# Patient Record
Sex: Female | Born: 1940
Health system: Southern US, Community
[De-identification: ages and names within clinical notes are randomized; demographics above are authoritative.]

## PROBLEM LIST (undated history)

## (undated) DIAGNOSIS — I480 Paroxysmal atrial fibrillation: Secondary | ICD-10-CM

## (undated) DIAGNOSIS — N183 Chronic kidney disease, stage 3 unspecified: Secondary | ICD-10-CM

## (undated) DIAGNOSIS — G4733 Obstructive sleep apnea (adult) (pediatric): Secondary | ICD-10-CM

## (undated) DIAGNOSIS — R52 Pain, unspecified: Secondary | ICD-10-CM

## (undated) DIAGNOSIS — F419 Anxiety disorder, unspecified: Secondary | ICD-10-CM

## (undated) DIAGNOSIS — I10 Essential (primary) hypertension: Secondary | ICD-10-CM

## (undated) DIAGNOSIS — I272 Pulmonary hypertension, unspecified: Secondary | ICD-10-CM

## (undated) DIAGNOSIS — Z9989 Dependence on other enabling machines and devices: Secondary | ICD-10-CM

## (undated) DIAGNOSIS — E875 Hyperkalemia: Secondary | ICD-10-CM

## (undated) DIAGNOSIS — D649 Anemia, unspecified: Secondary | ICD-10-CM

## (undated) DIAGNOSIS — I350 Nonrheumatic aortic (valve) stenosis: Secondary | ICD-10-CM

## (undated) DIAGNOSIS — K219 Gastro-esophageal reflux disease without esophagitis: Secondary | ICD-10-CM

## (undated) DIAGNOSIS — M8580 Other specified disorders of bone density and structure, unspecified site: Secondary | ICD-10-CM

## (undated) DIAGNOSIS — B009 Herpesviral infection, unspecified: Secondary | ICD-10-CM

## (undated) DIAGNOSIS — M109 Gout, unspecified: Secondary | ICD-10-CM

## (undated) DIAGNOSIS — M2550 Pain in unspecified joint: Secondary | ICD-10-CM

## (undated) DIAGNOSIS — G3184 Mild cognitive impairment, so stated: Secondary | ICD-10-CM

## (undated) DIAGNOSIS — E119 Type 2 diabetes mellitus without complications: Secondary | ICD-10-CM

## (undated) DIAGNOSIS — E785 Hyperlipidemia, unspecified: Secondary | ICD-10-CM

## (undated) DIAGNOSIS — R001 Bradycardia, unspecified: Secondary | ICD-10-CM

## (undated) DIAGNOSIS — I6523 Occlusion and stenosis of bilateral carotid arteries: Secondary | ICD-10-CM

## (undated) DIAGNOSIS — G459 Transient cerebral ischemic attack, unspecified: Secondary | ICD-10-CM

## (undated) HISTORY — DX: Other specified disorders of bone density and structure, unspecified site: M85.80

## (undated) HISTORY — PX: ABDOMINAL HYSTERECTOMY: SHX81

## (undated) HISTORY — PX: BLADDER SURGERY: SHX569

## (undated) HISTORY — DX: Anxiety disorder, unspecified: F41.9

## (undated) HISTORY — DX: Herpesviral infection, unspecified: B00.9

## (undated) HISTORY — DX: Nonrheumatic aortic (valve) stenosis: I35.0

## (undated) HISTORY — DX: Pain in unspecified joint: M25.50

## (undated) HISTORY — DX: Anemia, unspecified: D64.9

## (undated) HISTORY — DX: Pain, unspecified: R52

## (undated) HISTORY — DX: Dependence on other enabling machines and devices: Z99.89

## (undated) HISTORY — DX: Hyperkalemia: E87.5

## (undated) HISTORY — DX: Hyperlipidemia, unspecified: E78.5

## (undated) HISTORY — DX: Obstructive sleep apnea (adult) (pediatric): G47.33

## (undated) HISTORY — DX: Occlusion and stenosis of bilateral carotid arteries: I65.23

## (undated) HISTORY — DX: Transient cerebral ischemic attack, unspecified: G45.9

## (undated) HISTORY — DX: Gout, unspecified: M10.9

## (undated) HISTORY — PX: CHOLECYSTECTOMY: SHX55

## (undated) HISTORY — DX: Type 2 diabetes mellitus without complications: E11.9

## (undated) HISTORY — DX: Pulmonary hypertension, unspecified: I27.20

## (undated) HISTORY — DX: Morbid (severe) obesity due to excess calories: E66.01

## (undated) HISTORY — PX: BACK SURGERY: SHX140

## (undated) HISTORY — DX: Gastro-esophageal reflux disease without esophagitis: K21.9

## (undated) HISTORY — DX: Essential (primary) hypertension: I10

## (undated) HISTORY — DX: Mild cognitive impairment of uncertain or unknown etiology: G31.84

## (undated) HISTORY — PX: APPENDECTOMY: SHX54

## (undated) HISTORY — DX: Bradycardia, unspecified: R00.1

## (undated) HISTORY — DX: Chronic kidney disease, stage 3 (moderate): N18.3

## (undated) HISTORY — DX: Paroxysmal atrial fibrillation: I48.0

## (undated) HISTORY — DX: Chronic kidney disease, stage 3 unspecified: N18.30

---

## 2012-01-26 DIAGNOSIS — I2510 Atherosclerotic heart disease of native coronary artery without angina pectoris: Secondary | ICD-10-CM | POA: Insufficient documentation

## 2012-01-31 DIAGNOSIS — E039 Hypothyroidism, unspecified: Secondary | ICD-10-CM | POA: Insufficient documentation

## 2012-01-31 DIAGNOSIS — Z9189 Other specified personal risk factors, not elsewhere classified: Secondary | ICD-10-CM | POA: Insufficient documentation

## 2012-01-31 DIAGNOSIS — G459 Transient cerebral ischemic attack, unspecified: Secondary | ICD-10-CM | POA: Insufficient documentation

## 2012-01-31 DIAGNOSIS — N1832 Chronic kidney disease, stage 3b: Secondary | ICD-10-CM | POA: Insufficient documentation

## 2012-01-31 DIAGNOSIS — F419 Anxiety disorder, unspecified: Secondary | ICD-10-CM | POA: Insufficient documentation

## 2012-01-31 DIAGNOSIS — E118 Type 2 diabetes mellitus with unspecified complications: Secondary | ICD-10-CM | POA: Insufficient documentation

## 2012-01-31 DIAGNOSIS — M329 Systemic lupus erythematosus, unspecified: Secondary | ICD-10-CM | POA: Insufficient documentation

## 2013-04-02 ENCOUNTER — Other Ambulatory Visit: Payer: Self-pay | Admitting: *Deleted

## 2013-04-02 MED ORDER — "NEEDLE (DISP) 30G X 1/2"" MISC": 1.0000 | Freq: Two times a day (BID) | Status: DC

## 2013-04-02 MED ORDER — PIOGLITAZONE HCL 15 MG PO TABS: 15.0000 mg | ORAL_TABLET | Freq: Every day | ORAL | Status: DC

## 2013-05-26 ENCOUNTER — Other Ambulatory Visit: Payer: Self-pay | Admitting: Cardiology

## 2013-05-26 DIAGNOSIS — Z79899 Other long term (current) drug therapy: Secondary | ICD-10-CM

## 2013-05-26 DIAGNOSIS — I4891 Unspecified atrial fibrillation: Secondary | ICD-10-CM

## 2013-05-29 ENCOUNTER — Other Ambulatory Visit: Payer: Self-pay | Admitting: *Deleted

## 2013-05-29 ENCOUNTER — Other Ambulatory Visit: Payer: Self-pay | Admitting: Endocrinology

## 2013-05-29 DIAGNOSIS — E119 Type 2 diabetes mellitus without complications: Secondary | ICD-10-CM | POA: Insufficient documentation

## 2013-05-29 DIAGNOSIS — E785 Hyperlipidemia, unspecified: Secondary | ICD-10-CM | POA: Insufficient documentation

## 2013-05-29 DIAGNOSIS — E782 Mixed hyperlipidemia: Secondary | ICD-10-CM | POA: Insufficient documentation

## 2013-05-31 ENCOUNTER — Other Ambulatory Visit (INDEPENDENT_AMBULATORY_CARE_PROVIDER_SITE_OTHER): Payer: Medicare Other

## 2013-05-31 DIAGNOSIS — E119 Type 2 diabetes mellitus without complications: Secondary | ICD-10-CM

## 2013-05-31 DIAGNOSIS — E785 Hyperlipidemia, unspecified: Secondary | ICD-10-CM

## 2013-05-31 LAB — URINALYSIS, ROUTINE W REFLEX MICROSCOPIC
Bilirubin Urine: NEGATIVE
Hgb urine dipstick: NEGATIVE
Ketones, ur: NEGATIVE
Nitrite: POSITIVE
Specific Gravity, Urine: 1.01 (ref 1.000–1.030)
Total Protein, Urine: NEGATIVE
Urine Glucose: NEGATIVE
Urobilinogen, UA: 0.2 (ref 0.0–1.0)

## 2013-05-31 LAB — COMPREHENSIVE METABOLIC PANEL
ALT: 26 U/L (ref 0–35)
AST: 23 U/L (ref 0–37)
CO2: 25 mEq/L (ref 19–32)
Calcium: 9 mg/dL (ref 8.4–10.5)
Chloride: 105 mEq/L (ref 96–112)
Creatinine, Ser: 1.3 mg/dL — ABNORMAL HIGH (ref 0.4–1.2)
GFR: 41.28 mL/min — ABNORMAL LOW (ref >60.00)
Potassium: 4.3 mEq/L (ref 3.5–5.1)
Sodium: 137 mEq/L (ref 135–145)
Total Protein: 7.1 g/dL (ref 6.0–8.3)

## 2013-05-31 LAB — LIPID PANEL
HDL: 30.6 mg/dL — ABNORMAL LOW (ref >39.00)
Triglycerides: 242 mg/dL — ABNORMAL HIGH (ref 0.0–149.0)
VLDL: 48.4 mg/dL — ABNORMAL HIGH (ref 0.0–40.0)

## 2013-05-31 LAB — LDL CHOLESTEROL, DIRECT: Direct LDL: 97.2 mg/dL

## 2013-05-31 LAB — HEMOGLOBIN A1C: Hgb A1c MFr Bld: 6.8 % — ABNORMAL HIGH (ref 4.6–6.5)

## 2013-06-04 ENCOUNTER — Ambulatory Visit (INDEPENDENT_AMBULATORY_CARE_PROVIDER_SITE_OTHER): Payer: Medicare Other | Admitting: Endocrinology

## 2013-06-04 ENCOUNTER — Other Ambulatory Visit: Payer: Self-pay | Admitting: *Deleted

## 2013-06-04 ENCOUNTER — Encounter: Payer: Self-pay | Admitting: Endocrinology

## 2013-06-04 VITALS — BP 128/78 | HR 71 | Temp 98.5°F | Resp 12 | Ht 61.0 in | Wt 254.1 lb

## 2013-06-04 DIAGNOSIS — E039 Hypothyroidism, unspecified: Secondary | ICD-10-CM

## 2013-06-04 DIAGNOSIS — IMO0001 Reserved for inherently not codable concepts without codable children: Secondary | ICD-10-CM

## 2013-06-04 MED ORDER — METFORMIN HCL 500 MG PO TABS: 500.0000 mg | ORAL_TABLET | Freq: Every day | ORAL | Status: DC

## 2013-06-04 MED ORDER — GLUCOSE BLOOD VI STRP: ORAL_STRIP | Status: DC

## 2013-06-04 NOTE — Progress Notes (Signed)
Patient ID: Tina Hester, female   DOB: 1941/08/24, 72 y.o.   MRN: [id number]  Tina Hester is an 72 y.o. female.   Reason for Appointment: Diabetes follow-up   History of Present Illness   Diagnosis: Type 2 DIABETES MELITUS, long-standing    Previous history: Her old records are not available and details not known. However she has been on insulin for a few years. Because of cost she has been switched from Lantus to NPH insulin twice a day She had previously been taking metformin but this was stopped when she had renal insufficiency Overall her blood sugars have been difficult to control and she has previously had relatively poor control. However with adding Actos several months ago her blood sugars had started improving significantly  Recent history: Her blood sugars appear to be doing reasonably well recently with A1c below 7% now She is still taking large doses of mealtime coverage but does not have any significant hypoglycemia except occasionally has readings in the 60s She is concerned when her blood sugars near 100 in the morning and will not take her full dose of NovoLog Also is checking her blood sugar somewhat sporadically now. May have occasional high readings after meals with dietary and consistency     Oral hypoglycemic drugs: Actos        Side effects from medications: None, no edema with Actos Insulin regimen: Humulin N   20 am, 45 hs Humalog before meals 30-40 breakfast; 40 lunch -45  supper      Proper timing of medications in relation to meals: Yes.          Monitors blood glucose: Once a day.    Glucometer:  FreeStyle       Blood Glucose readings from meter download: readings before breakfast:  105-142, 1-2 PM: 63, 185, 101. Late evening 69-225 with only one reading below 70 and once over 180. Overall average 131  Hypoglycemia frequency:  rare, mild hypoglycemia after lunch recently     Meals: 2-3 meals per day. Diet variable, has a protein at breakfast  consistently with a lean meat or egg;          Physical activity: exercise: minimal, has musculoskeletal limitations. Also does not want to join the [facility] because of cost            Wt Readings from Last 3 Encounters:  06/04/13 254 lb 1.6 oz (115.259 kg)    LABS:  Appointment on 05/31/2013  Component Date Value Range Status  . Hemoglobin A1C 05/31/2013 6.8* 4.6 - 6.5 % Final   Glycemic Control Guidelines for People with Diabetes:Non Diabetic:  <6%Goal of Therapy: <7%Additional Action Suggested:  >8%   . Sodium 05/31/2013 137  135 - 145 mEq/L Final  . Potassium 05/31/2013 4.3  3.5 - 5.1 mEq/L Final  . Chloride 05/31/2013 105  96 - 112 mEq/L Final  . CO2 05/31/2013 25  19 - 32 mEq/L Final  . Glucose, Bld 05/31/2013 109* 70 - 99 mg/dL Final  . BUN 40/98/1191 29* 6 - 23 mg/dL Final  . Creatinine, Ser 05/31/2013 1.3* 0.4 - 1.2 mg/dL Final  . Total Bilirubin 05/31/2013 0.6  0.3 - 1.2 mg/dL Final  . Alkaline Phosphatase 05/31/2013 33* 39 - 117 U/L Final  . AST 05/31/2013 23  0 - 37 U/L Final  . ALT 05/31/2013 26  0 - 35 U/L Final  . Total Protein 05/31/2013 7.1  6.0 - 8.3 g/dL Final  . Albumin 47/82/9562 3.7  3.5 -  5.2 g/dL Final  . Calcium 16/05/9603 9.0  8.4 - 10.5 mg/dL Final  . GFR 54/04/8118 41.28* >60.00 mL/min Final  . Microalb, Ur 05/31/2013 5.3* 0.0 - 1.9 mg/dL Final  . Creatinine,U 14/78/2956 56.8   Final  . Microalb Creat Ratio 05/31/2013 9.3  0.0 - 30.0 mg/g Final  . Cholesterol 05/31/2013 166  0 - 200 mg/dL Final   ATP III Classification       Desirable:  < 200 mg/dL               Borderline High:  200 - 239 mg/dL          High:  > = 213 mg/dL  . Triglycerides 05/31/2013 242.0* 0.0 - 149.0 mg/dL Final   Normal:  <086 mg/dLBorderline High:  150 - 199 mg/dL  . HDL 05/31/2013 30.60* >39.00 mg/dL Final  . VLDL 57/84/6962 48.4* 0.0 - 40.0 mg/dL Final  . Total CHOL/HDL Ratio 05/31/2013 5   Final                  Men          Women1/2 Average Risk     3.4          3.3Average Risk           5.0          4.42X Average Risk          9.6          7.13X Average Risk          15.0          11.0                      . Color, Urine 05/31/2013 LT. YELLOW  Yellow;Lt. Yellow Final  . APPearance 05/31/2013 CLEAR  Clear Final  . Specific Gravity, Urine 05/31/2013 1.010  1.000-1.030 Final  . pH 05/31/2013 6.0  5.0 - 8.0 Final  . Total Protein, Urine 05/31/2013 NEGATIVE  Negative Final  . Urine Glucose 05/31/2013 NEGATIVE  Negative Final  . Ketones, ur 05/31/2013 NEGATIVE  Negative Final  . Bilirubin Urine 05/31/2013 NEGATIVE  Negative Final  . Hgb urine dipstick 05/31/2013 NEGATIVE  Negative Final  . Urobilinogen, UA 05/31/2013 0.2  0.0 - 1.0 Final  . Leukocytes, UA 05/31/2013 TRACE  Negative Final  . Nitrite 05/31/2013 POSITIVE  Negative Final  . WBC, UA 05/31/2013 3-6/hpf  0-2/hpf Final  . RBC / HPF 05/31/2013 0-2/hpf  0-2/hpf Final  . Squamous Epithelial / LPF 05/31/2013 Rare(0-4/hpf)  Rare(0-4/hpf) Final  . Bacteria, UA 05/31/2013 Many(>50/hpf)  None Final  . Direct LDL 05/31/2013 97.2   Final   Optimal:  <100 mg/dLNear or Above Optimal:  100-129 mg/dLBorderline High:  130-159 mg/dLHigh:  160-189 mg/dLVery High:  >190 mg/dL      Medication List       This list is accurate as of: 06/04/13 10:28 AM.  Always use your most recent med list.               acyclovir 400 MG tablet  Commonly known as:  ZOVIRAX     amLODipine 5 MG tablet  Commonly known as:  NORVASC     atorvastatin 40 MG tablet  Commonly known as:  LIPITOR  40 mg.     COLCRYS 0.6 MG tablet  Generic drug:  colchicine     fenofibrate 145 MG tablet  Commonly known as:  TRICOR     HUMALOG  100 UNIT/ML injection  Generic drug:  insulin lispro  Takes 40-40-45, depending on sugars     HUMULIN N 100 UNIT/ML injection  Generic drug:  insulin NPH  Takes 20 units in am and 45 units at night     INSULIN SYRINGE .5CC/30GX5/16" 30G X 5/16" 0.5 ML Misc     levothyroxine 137 MCG tablet  Commonly known as:   SYNTHROID, LEVOTHROID     lisinopril-hydrochlorothiazide 20-25 MG per tablet  Commonly known as:  PRINZIDE,ZESTORETIC     metoprolol succinate 50 MG 24 hr tablet  Commonly known as:  TOPROL-XL     NEEDLE (DISP) 30 G 30G X 1/2" Misc  Commonly known as:  BD DISP NEEDLES  1 each by Does not apply route 2 (two) times daily before a meal.     pioglitazone 15 MG tablet  Commonly known as:  ACTOS  Take 1 tablet (15 mg total) by mouth daily.     prednisoLONE acetate 1 % ophthalmic suspension  Commonly known as:  PRED FORTE     ULORIC 40 MG tablet  Generic drug:  febuxostat  40 mg.        Allergies:  Allergies  Allergen Reactions  . Pneumovax 23 [Pneumococcal Vac Polyvalent] Anaphylaxis    weakness  . Sulfa Antibiotics Hives    No past medical history on file.  No past surgical history on file.  No family history on file.  Social History:  reports that she has never smoked. She has never used smokeless tobacco. Her alcohol and drug histories are not on file.  Review of Systems:  Hypertension:  blood pressure is excellent, currently taking Zestoretic, metoprolol and amlodipine   Lipids: Triglycerides have been high, was started on fenofibrate on her last visit, now 242, LDL is now below 100    Gout: Has had recurrent problems with this, has questions about starting Uloric given by her rheumatologist, not clear if she has tried allopurinol before  Still having problems with sciatica on the right side and foot pain  No recent swelling on her feet, does have joint swelling of the right foot periodically   Examination:   BP 128/78  Pulse 71  Temp(Src) 98.5 F (36.9 C)  Resp 12  Ht 5\' 1"  (1.549 m)  Wt 254 lb 1.6 oz (115.259 kg)  BMI 48.04 kg/m2  SpO2 97%  Body mass index is 48.04 kg/(m^2).    ASSESSMENT/ PLAN::   Diabetes type 2   Blood glucose control is good with her A1c below 7%. Overall doing quite well with NPH and Humalog along with low dose Actos She  has gained weight and has limited ability to exercise. She does not think she had large portions Because of her difficulty with weight loss will try low dose metformin again in the evening. Reassured her that she should do well with 500 mg daily, recent creatinine only 1.3 She initially check blood sugars more consistently especially after meals. Meanwhile can reduce her morning NovoLog to 35 and adjust her suppertime dose as discussed based on meal size and carbohydrate intake  CKD: Creatinine 1.3, he has been on lisinopril HCT  HYPERLIPIDEMIA: Although her triglycerides are still over 200 her LDL is below 100. HDL 31. Currently being treated with fenofibrate and also low dose Actos. Hopefully will lose weight and improve lipids further  Discussed management of gout, weight loss and glucose monitoring as well as sugar targets, insulin adjustment  Counseling time over 50% of today's 25 minute  visit   Prabhav Faulkenberry 06/04/2013, 10:28 AM

## 2013-06-04 NOTE — Patient Instructions (Addendum)
35 Humalog in ams; 40-45 at supper based on meal size  Please check blood sugars at least half the time about 2 hours after any meal and as directed on waking up. Please bring blood sugar monitor to each visit  Water aerobics  Metformin 500 mg at supper

## 2013-06-06 ENCOUNTER — Telehealth: Payer: Self-pay | Admitting: Cardiology

## 2013-06-06 NOTE — Telephone Encounter (Signed)
New Problem  Pt in "[place]" Blood thinner samples.. Eliquis

## 2013-06-07 MED ORDER — APIXABAN 5 MG PO TABS: 5.0000 mg | ORAL_TABLET | Freq: Two times a day (BID) | ORAL | Status: DC

## 2013-06-07 NOTE — Telephone Encounter (Signed)
Spoke with pt and made aware. She asked that I call in a rx for her.

## 2013-06-15 ENCOUNTER — Other Ambulatory Visit: Payer: Self-pay

## 2013-06-19 ENCOUNTER — Other Ambulatory Visit (INDEPENDENT_AMBULATORY_CARE_PROVIDER_SITE_OTHER): Payer: Medicare Other

## 2013-06-19 DIAGNOSIS — Z79899 Other long term (current) drug therapy: Secondary | ICD-10-CM

## 2013-06-19 DIAGNOSIS — I4891 Unspecified atrial fibrillation: Secondary | ICD-10-CM

## 2013-06-19 DIAGNOSIS — E039 Hypothyroidism, unspecified: Secondary | ICD-10-CM

## 2013-06-19 LAB — TSH: TSH: 1.28 u[IU]/mL (ref 0.35–5.50)

## 2013-06-19 LAB — CBC
HCT: 32.1 % — ABNORMAL LOW (ref 36.0–46.0)
MCHC: 34.2 g/dL (ref 30.0–36.0)
MCV: 92.3 fl (ref 78.0–100.0)
RBC: 3.48 Mil/uL — ABNORMAL LOW (ref 3.87–5.11)
RDW: 14.5 % (ref 11.5–14.6)

## 2013-06-19 LAB — T4, FREE: Free T4: 1.21 ng/dL (ref 0.60–1.60)

## 2013-06-21 ENCOUNTER — Other Ambulatory Visit: Payer: Self-pay | Admitting: *Deleted

## 2013-06-21 MED ORDER — FENOFIBRATE 145 MG PO TABS: 145.0000 mg | ORAL_TABLET | Freq: Every day | ORAL | Status: DC

## 2013-07-31 ENCOUNTER — Telehealth: Payer: Self-pay | Admitting: *Deleted

## 2013-07-31 NOTE — Telephone Encounter (Signed)
Patient called to change her pharmacy to mail order with [facility]. She needs refills on her eliquis, lisinopril, metoprolol, amlodipine, and lipitor. She also requests samples of eliquis, we are expecting some samples to come in today or tomorrow as we are currently out. She stated that her pcp recommends that she get her cardiologist to manage and refill all of these, but in the past her pcp has refilled them for her. Thanks, MI

## 2013-08-01 MED ORDER — APIXABAN 5 MG PO TABS: 5.0000 mg | ORAL_TABLET | Freq: Two times a day (BID) | ORAL | Status: DC

## 2013-08-01 MED ORDER — METOPROLOL SUCCINATE ER 50 MG PO TB24: 50.0000 mg | ORAL_TABLET | Freq: Two times a day (BID) | ORAL | Status: DC

## 2013-08-01 MED ORDER — LISINOPRIL-HYDROCHLOROTHIAZIDE 20-25 MG PO TABS: 1.0000 | ORAL_TABLET | Freq: Every day | ORAL | Status: DC

## 2013-08-01 MED ORDER — AMLODIPINE BESYLATE 5 MG PO TABS: 2.5000 mg | ORAL_TABLET | Freq: Every day | ORAL | Status: DC

## 2013-08-01 MED ORDER — ATORVASTATIN CALCIUM 40 MG PO TABS: 40.0000 mg | ORAL_TABLET | Freq: Every day | ORAL | Status: DC

## 2013-08-01 NOTE — Telephone Encounter (Signed)
Rx sent in for pt. Going to check on samples for pt.

## 2013-08-02 NOTE — Telephone Encounter (Signed)
Pt is aware samples are up front for her to pick up.

## 2013-08-15 ENCOUNTER — Other Ambulatory Visit: Payer: Self-pay | Admitting: *Deleted

## 2013-08-15 MED ORDER — "INSULIN SYRINGE 30G X 5/16"" 0.5 ML MISC": Status: DC

## 2013-08-15 MED ORDER — INSULIN LISPRO 100 UNIT/ML (KWIKPEN): PEN_INJECTOR | SUBCUTANEOUS | Status: DC

## 2013-08-31 ENCOUNTER — Other Ambulatory Visit: Payer: Medicare Other

## 2013-09-04 ENCOUNTER — Ambulatory Visit (INDEPENDENT_AMBULATORY_CARE_PROVIDER_SITE_OTHER): Payer: Medicare Other | Admitting: Endocrinology

## 2013-09-04 ENCOUNTER — Other Ambulatory Visit: Payer: Medicare Other

## 2013-09-04 ENCOUNTER — Encounter: Payer: Self-pay | Admitting: Endocrinology

## 2013-09-04 VITALS — BP 126/50 | HR 59 | Temp 98.2°F | Resp 14 | Ht 61.0 in | Wt 249.6 lb

## 2013-09-04 DIAGNOSIS — E1165 Type 2 diabetes mellitus with hyperglycemia: Principal | ICD-10-CM

## 2013-09-04 DIAGNOSIS — E669 Obesity, unspecified: Secondary | ICD-10-CM

## 2013-09-04 DIAGNOSIS — E785 Hyperlipidemia, unspecified: Secondary | ICD-10-CM

## 2013-09-04 DIAGNOSIS — IMO0001 Reserved for inherently not codable concepts without codable children: Secondary | ICD-10-CM

## 2013-09-04 DIAGNOSIS — M109 Gout, unspecified: Secondary | ICD-10-CM

## 2013-09-04 LAB — COMPREHENSIVE METABOLIC PANEL
ALT: 40 U/L — AB (ref 0–35)
AST: 32 U/L (ref 0–37)
Albumin: 3.8 g/dL (ref 3.5–5.2)
Alkaline Phosphatase: 48 U/L (ref 39–117)
BUN: 34 mg/dL — AB (ref 6–23)
CHLORIDE: 105 meq/L (ref 96–112)
CO2: 26 mEq/L (ref 19–32)
CREATININE: 1.4 mg/dL — AB (ref 0.4–1.2)
Calcium: 9.2 mg/dL (ref 8.4–10.5)
GFR: 38.27 mL/min — AB (ref >60.00)
GLUCOSE: 145 mg/dL — AB (ref 70–99)
Potassium: 4.7 mEq/L (ref 3.5–5.1)
Sodium: 138 mEq/L (ref 135–145)
Total Bilirubin: 0.5 mg/dL (ref 0.3–1.2)
Total Protein: 7 g/dL (ref 6.0–8.3)

## 2013-09-04 LAB — HEMOGLOBIN A1C: Hgb A1c MFr Bld: 6.7 % — ABNORMAL HIGH (ref 4.6–6.5)

## 2013-09-04 NOTE — Patient Instructions (Signed)
Take Lipitor daily  Take insulin just before eating  Do not take over 20 units Humalog at bedtime

## 2013-09-04 NOTE — Progress Notes (Signed)
Patient ID: Tina Hester, female   DOB: [birth year], 73 y.o.   MRN: [id number]   Reason for Appointment: Diabetes follow-up   History of Present Illness   Diagnosis: Type 2 DIABETES MELITUS, long-standing    Previous history: Her old records are not available and details not known. However she has been on insulin for a few years. Because of cost she has been switched from Lantus to NPH insulin twice a day She had previously been taking metformin but this was stopped when she had renal insufficiency Overall her blood sugars have been difficult to control and she has previously had relatively poor control. However with adding Actos several months ago her blood sugars had started improving significantly  Recent history: Her blood sugars appear to be doing overall well recently However A1c is pending as she was told by her insurance not to get labs ahead of time Problems identified:  She is not checking her blood sugars often enough  She is periodically forgetting to take her insulin at mealtimes and she thinks she has trouble remembering. Also has a busy lifestyle and may not take the insulin when she is eating. However her compliance is somewhat better with using the insulin pen  Tendency to hypoglycemia occasionally with taking her Humalog at bedtime when she forgets to suppertime dose  Tendency to hypoglycemia if she is doing much more activity like walking at the mall  Periodic high readings after supper are mostly related to her getting the Humalog before eating  Starting to lose a little weight and is starting to do some weight training; still needs significant weight loss  Limitation of her metformin dose because of renal dysfunction. However fasting readings may be better with starting 500 mg in the evening on the last visit   She is still taking large doses of mealtime coverage although not exceeding 40 units recently      Oral hypoglycemic drugs: Actos, Metformin        Side  effects from medications: None, no edema with Actos Insulin regimen: Humulin N 20 am, 45 hs Humalog before meals 30-40 based on Carbs    Proper timing of medications in relation to meals: Yes.           Monitors blood glucose: Once a day.    Glucometer:  FreeStyle       Blood Glucose readings from meter download:   PREMEAL Breakfast Lunch Dinner Bedtime Overall  Glucose range:  99-220   143, 148   58, 139   85-282    Mean/median:  139     205  137   POST-MEAL PC Breakfast PC Lunch PC Dinner  Glucose range: ?   80-122  ?   Mean/median:     OVERNIGHT: 44-230  Hypoglycemia: Has 3 episodes, twice overnight and once at 7:40 PM    Meals: 2-3 meals per day. Diet variable, has a protein at breakfast consistently with a lean meat or egg          Physical activity: exercise: starting weights recently            Wt Readings from Last 3 Encounters:  09/04/13 249 lb 9.6 oz (113.218 kg)  06/04/13 254 lb 1.6 oz (115.259 kg)   LABS:  Lab Results  Component Value Date   HGBA1C 6.8* 05/31/2013   Lab Results  Component Value Date   MICROALBUR 5.3* 05/31/2013   CREATININE 1.4* 06/19/2013       Medication List  This list is accurate as of: 09/04/13 10:37 AM.  Always use your most recent med list.               acyclovir 400 MG tablet  Commonly known as:  ZOVIRAX     amLODipine 5 MG tablet  Commonly known as:  NORVASC  Take 0.5 tablets (2.5 mg total) by mouth daily.     apixaban 5 MG Tabs tablet  Commonly known as:  ELIQUIS  Take 1 tablet (5 mg total) by mouth 2 (two) times daily.     atorvastatin 40 MG tablet  Commonly known as:  LIPITOR  Take 1 tablet (40 mg total) by mouth daily at 6 PM.     COLCRYS 0.6 MG tablet  Generic drug:  colchicine     fenofibrate 145 MG tablet  Commonly known as:  TRICOR  Take 1 tablet (145 mg total) by mouth daily.     glucose blood test strip  Commonly known as:  FREESTYLE LITE  Use as instructed     HUMULIN N 100 UNIT/ML injection   Generic drug:  insulin NPH Human  Takes 20 units in am and 45 units at night     insulin lispro 100 UNIT/ML KiwkPen  Commonly known as:  HUMALOG  Inject 40 units 3 times a day with meals     INSULIN SYRINGE .5CC/30GX5/16" 30G X 5/16" 0.5 ML Misc  Use 2 needles per day to inject insulin     levothyroxine 137 MCG tablet  Commonly known as:  SYNTHROID, LEVOTHROID     lisinopril-hydrochlorothiazide 20-25 MG per tablet  Commonly known as:  PRINZIDE,ZESTORETIC  Take 1 tablet by mouth daily.     metFORMIN 500 MG tablet  Commonly known as:  GLUCOPHAGE  Take 1 tablet (500 mg total) by mouth daily after supper.     metoprolol succinate 50 MG 24 hr tablet  Commonly known as:  TOPROL-XL  Take 1 tablet (50 mg total) by mouth 2 (two) times daily.     NEEDLE (DISP) 30 G 30G X 1/2" Misc  Commonly known as:  BD DISP NEEDLES  1 each by Does not apply route 2 (two) times daily before a meal.     pioglitazone 15 MG tablet  Commonly known as:  ACTOS  Take 1 tablet (15 mg total) by mouth daily.     prednisoLONE acetate 1 % ophthalmic suspension  Commonly known as:  PRED FORTE     ULORIC 40 MG tablet  Generic drug:  febuxostat  40 mg.        Allergies:  Allergies  Allergen Reactions  . Pneumovax 23 [Pneumococcal Vac Polyvalent] Anaphylaxis    weakness  . Sulfa Antibiotics Hives    No past medical history on file.  No past surgical history on file.  No family history on file.  Social History:  reports that she has never smoked. She has never used smokeless tobacco. Her alcohol and drug histories are not on file.  Review of Systems:  Hypertension:  blood pressure is again low normal, currently taking Zestoretic, metoprolol and amlodipine   Lipids: Triglycerides have been high, was started on fenofibrate on her last visit, now 242, LDL  below 100 Fenofibrate is being taken 3/7 and Lipitor 40mg  only 4/7 days because she misunderstood that Lipitor and fenofibrate were the same  type of medication    Gout: Has had recurrent problems with this,  not clear if she has tried allopurinol before  Still having  problems with pain in her legs and feet. She does have joint swelling of the right foot periodically   Examination:   BP 126/50  Pulse 59  Temp(Src) 98.2 F (36.8 C)  Resp 14  Ht 5\' 1"  (1.549 m)  Wt 249 lb 9.6 oz (113.218 kg)  BMI 47.19 kg/m2  SpO2 96%  Body mass index is 47.19 kg/(m^2).   Feet look normal on inspection, no edema  ASSESSMENT/ PLAN::   Diabetes type 2   Blood glucose control is overall fairly good although A1c is pending See history of present illness for detailed discussion on current management, blood sugar patterns and problems identified Although she is doing better with her fasting readings with adding low dose metformin she has inconsistent readings in the evenings Most of this is related to noncompliance with mealtime insulin She also does not understand how to adjust correction doses of insulin especially at bedtime and advised her on how to dose extra insulin Discussed needing to check blood sugars more consistently  Overall she appears to be more compliant and is going to start exercise program, weight is slightly better She will continue the same basic insulin regimen Metformin dose to be decided on today's renal function results A1c to be done today  CKD: Creatinine was high on the last visit, she has been on lisinopril HCT with 25 mg HCT and apparently is seeing a nephrologist also  HYPERLIPIDEMIA: Although her triglycerides are still over 200 her Lipitor is not being taken daily by misunderstanding. Because of her high-risk cardiovascular status she should be on high-dose statins anyway and discussed that she should take her Lipitor daily  Discussed management of her diabetes in general, compliance lipids and glucose monitoring as well as sugar targets, insulin adjustment  Counseling time over 50% of today's 25 minute  visit   Robinson Brinkley 09/04/2013, 10:37 AM

## 2013-09-07 ENCOUNTER — Other Ambulatory Visit: Payer: Self-pay | Admitting: *Deleted

## 2013-09-07 MED ORDER — INSULIN LISPRO 100 UNIT/ML (KWIKPEN): PEN_INJECTOR | SUBCUTANEOUS | Status: DC

## 2013-09-07 MED ORDER — FENOFIBRATE 145 MG PO TABS: 145.0000 mg | ORAL_TABLET | Freq: Every day | ORAL | Status: DC

## 2013-09-07 MED ORDER — INSULIN NPH (HUMAN) (ISOPHANE) 100 UNIT/ML ~~LOC~~ SUSP: SUBCUTANEOUS | Status: DC

## 2013-09-07 MED ORDER — PIOGLITAZONE HCL 15 MG PO TABS: 15.0000 mg | ORAL_TABLET | Freq: Every day | ORAL | Status: DC

## 2013-09-07 MED ORDER — METFORMIN HCL 500 MG PO TABS: 500.0000 mg | ORAL_TABLET | Freq: Every day | ORAL | Status: DC

## 2013-09-19 ENCOUNTER — Encounter: Payer: Self-pay | Admitting: Cardiology

## 2013-10-01 ENCOUNTER — Other Ambulatory Visit: Payer: Self-pay | Admitting: Endocrinology

## 2013-10-05 ENCOUNTER — Telehealth: Payer: Self-pay | Admitting: *Deleted

## 2013-10-05 NOTE — Telephone Encounter (Signed)
Patient requests eliquis samples. She is aware that they will be left at the front desk for pick up. 

## 2013-10-08 ENCOUNTER — Telehealth: Payer: Self-pay | Admitting: Cardiology

## 2013-10-08 ENCOUNTER — Other Ambulatory Visit: Payer: Self-pay | Admitting: *Deleted

## 2013-10-08 MED ORDER — ONETOUCH DELICA LANCETS 33G MISC: Status: DC

## 2013-10-08 MED ORDER — GLUCOSE BLOOD VI STRP: ORAL_STRIP | Status: DC

## 2013-10-08 MED ORDER — ONETOUCH VERIO W/DEVICE KIT: 1.0000 | PACK | Freq: Every day | Status: DC

## 2013-10-08 NOTE — Telephone Encounter (Signed)
Walk in pt Form " Medication Assistance" paperwork Dropped Off gave to Allen

## 2013-10-09 ENCOUNTER — Other Ambulatory Visit: Payer: Self-pay | Admitting: *Deleted

## 2013-10-29 ENCOUNTER — Telehealth: Payer: Self-pay | Admitting: Cardiology

## 2013-10-29 NOTE — Telephone Encounter (Signed)
New message  Patient needs samples of Eliquist, please call and advise.

## 2013-10-30 ENCOUNTER — Telehealth: Payer: Self-pay

## 2013-10-30 NOTE — Telephone Encounter (Signed)
Patient called needing samples of eliquis, I placed the samples up front

## 2013-11-01 ENCOUNTER — Ambulatory Visit: Payer: Self-pay | Admitting: Cardiology

## 2013-11-06 ENCOUNTER — Ambulatory Visit: Payer: Medicare Other | Admitting: Cardiology

## 2013-11-12 ENCOUNTER — Telehealth: Payer: Self-pay | Admitting: *Deleted

## 2013-11-12 NOTE — Telephone Encounter (Signed)
Patient assistance form from [facility] mailed again to patient for [facility], will get MD part signed when I receive these forms back.

## 2013-11-22 ENCOUNTER — Ambulatory Visit: Payer: Medicare Other | Admitting: Cardiology

## 2013-11-23 ENCOUNTER — Ambulatory Visit: Payer: Medicare Other | Admitting: Cardiology

## 2013-12-06 ENCOUNTER — Other Ambulatory Visit: Payer: Self-pay | Admitting: *Deleted

## 2013-12-06 ENCOUNTER — Telehealth: Payer: Self-pay | Admitting: Endocrinology

## 2013-12-06 MED ORDER — INSULIN NPH (HUMAN) (ISOPHANE) 100 UNIT/ML ~~LOC~~ SUSP: SUBCUTANEOUS | Status: DC

## 2013-12-06 NOTE — Telephone Encounter (Signed)
rx sent

## 2013-12-06 NOTE — Telephone Encounter (Signed)
Pt needs humulin called in today she is out and there is no refills at pharmacy

## 2013-12-13 ENCOUNTER — Encounter: Payer: Self-pay | Admitting: Cardiology

## 2013-12-13 ENCOUNTER — Other Ambulatory Visit: Payer: Self-pay | Admitting: Cardiology

## 2013-12-13 ENCOUNTER — Ambulatory Visit (INDEPENDENT_AMBULATORY_CARE_PROVIDER_SITE_OTHER): Payer: Medicare Other | Admitting: Cardiology

## 2013-12-13 VITALS — BP 156/48 | HR 45 | Ht 61.0 in | Wt 227.0 lb

## 2013-12-13 DIAGNOSIS — I480 Paroxysmal atrial fibrillation: Secondary | ICD-10-CM

## 2013-12-13 DIAGNOSIS — Z9989 Dependence on other enabling machines and devices: Secondary | ICD-10-CM

## 2013-12-13 DIAGNOSIS — I10 Essential (primary) hypertension: Secondary | ICD-10-CM | POA: Insufficient documentation

## 2013-12-13 DIAGNOSIS — I359 Nonrheumatic aortic valve disorder, unspecified: Secondary | ICD-10-CM

## 2013-12-13 DIAGNOSIS — I4819 Other persistent atrial fibrillation: Secondary | ICD-10-CM | POA: Insufficient documentation

## 2013-12-13 DIAGNOSIS — I4891 Unspecified atrial fibrillation: Secondary | ICD-10-CM

## 2013-12-13 DIAGNOSIS — G4733 Obstructive sleep apnea (adult) (pediatric): Secondary | ICD-10-CM

## 2013-12-13 DIAGNOSIS — E669 Obesity, unspecified: Secondary | ICD-10-CM

## 2013-12-13 MED ORDER — AMLODIPINE BESYLATE 5 MG PO TABS: 5.0000 mg | ORAL_TABLET | Freq: Every day | ORAL | Status: DC

## 2013-12-13 MED ORDER — METOPROLOL SUCCINATE ER 25 MG PO TB24: 25.0000 mg | ORAL_TABLET | Freq: Two times a day (BID) | ORAL | Status: DC

## 2013-12-13 NOTE — Patient Instructions (Addendum)
Your physician has recommended you make the following change in your medication:  1. Increase Amlodipine to 5 mg daily 2. Decrease Metoprolol to 25 mg 1 tab twice daily  Your physician wants you to follow-up in: 6 months with Dr.Turner  You will receive a reminder letter in the mail two months in advance. If you don't receive a letter, please call our office to schedule the follow-up appointment.

## 2013-12-13 NOTE — Progress Notes (Signed)
[address], [place], [place]  [address] Phone: [phone] Fax:  [phone]  Date:  12/13/2013   ID:  Tina Hester, DOB 05/02/1941, MRN [id number]  PCP:  Lilian Coma, MD  Cardiologist:  Fransico Him, MD     History of Present Illness: Tina Hester is a 73 y.o. female with a history of PAF, HTN and OSA on CPAP presents today for followup.  She is doing well.  She denies any chest pain, SOB, DOE, LE edema, dizziness, palpitations or syncope.  She tolerates her CPAP well.  She feels the pressure is adequate and has no problems with the mask.  She feels rested in the am and has no daytime sleepiness.   Wt Readings from Last 3 Encounters:  12/13/13 227 lb (102.967 kg)  09/04/13 249 lb 9.6 oz (113.218 kg)  06/04/13 254 lb 1.6 oz (115.259 kg)     Past Medical History  Diagnosis Date  . Diabetes mellitus without complication     type 2  . GERD (gastroesophageal reflux disease)   . Hyperlipidemia   . Palpitations   . OSA on CPAP   . Anxiety   . TIA (transient ischemic attack)     remote history of TIA's early 2000  . Anemia     s/p Heme work -up normal EGD and colonscopy in 2012 per pt,neg SPEP  . HSV-1 (herpes simplex virus 1) infection     Acyclovir prn  . Joint pain     osteoarthritis by Xray- possible erosion of R 4th MCP,elevated uric  acid ,ANA +   . Morbid obesity   . Gout   . Osteopenia   . Pain management     Neurosurg: Dr Clydell Hakim  . CKD (chronic kidney disease), stage III   . Aortic valve disorders   . Hypertension   . PAF (paroxysmal atrial fibrillation)     Current Outpatient Prescriptions  Medication Sig Dispense Refill  . amLODipine (NORVASC) 5 MG tablet Take 0.5 tablets (2.5 mg total) by mouth daily.  90 tablet  3  . apixaban (ELIQUIS) 5 MG TABS tablet Take 1 tablet (5 mg total) by mouth 2 (two) times daily.  90 tablet  3  . atorvastatin (LIPITOR) 40 MG tablet Take 1 tablet (40 mg total) by mouth daily at 6 PM.  90 tablet  3   . COLCRYS 0.6 MG tablet as needed.       . fenofibrate (TRICOR) 145 MG tablet Take 145 mg by mouth. TAKING 3 TIMES A WEEK      . glucose blood (FREESTYLE LITE) test strip Use as instructed to check blood sugars one time a day dx code 250.00  50 each  5  . insulin NPH Human (HUMULIN N,NOVOLIN N) 100 UNIT/ML injection Inject 45 Units into the skin daily before breakfast.      . Insulin Syringe-Needle U-100 (INSULIN SYRINGE .5CC/30GX5/16") 30G X 5/16" 0.5 ML MISC Use 2 needles per day to inject insulin  60 each  5  . levothyroxine (SYNTHROID, LEVOTHROID) 137 MCG tablet Take 137 mcg by mouth daily before breakfast.       . lisinopril-hydrochlorothiazide (PRINZIDE,ZESTORETIC) 20-25 MG per tablet Take 1 tablet by mouth daily.  90 tablet  3  . metFORMIN (GLUCOPHAGE) 500 MG tablet Take 1 tablet (500 mg total) by mouth daily after supper.  90 tablet  1  . metoprolol succinate (TOPROL-XL) 50 MG 24 hr tablet Take 1 tablet (50 mg total) by mouth 2 (  two) times daily.  90 tablet  3  . NEEDLE, DISP, 30 G (BD DISP NEEDLES) 30G X 1/2" MISC 1 each by Does not apply route 2 (two) times daily before a meal.  100 each  5  . ONETOUCH DELICA LANCETS 44I MISC Use to check blood sugars once daily  50 each  3  . prednisoLONE acetate (PRED FORTE) 1 % ophthalmic suspension       . ULORIC 40 MG tablet 40 mg.      . pioglitazone (ACTOS) 15 MG tablet Take 1 tablet (15 mg total) by mouth daily.  90 tablet  1   No current facility-administered medications for this visit.    Allergies:    Allergies  Allergen Reactions  . Pneumovax 23 [Pneumococcal Vac Polyvalent] Anaphylaxis    weakness  . Sulfa Antibiotics Hives    Social History:  The patient  reports that she has never smoked. She has never used smokeless tobacco. She reports that she does not drink alcohol or use illicit drugs.   Family History:  The patient's family history includes Arrhythmia in her brother.   ROS:  Please see the history of present illness.       All other systems reviewed and negative.   PHYSICAL EXAM: VS:  BP 156/48  Pulse 45  Ht 5\' 1"  (1.549 m)  Wt 227 lb (102.967 kg)  BMI 42.91 kg/m2  SpO2 97% Well nourished, well developed, in no acute distress HEENT: normal Neck: no JVD Cardiac:  normal S1, S2; RRR; 2/6 SM at RUSB to LLSB and radiates into her carotid arteries bilaterally Lungs:  clear to auscultation bilaterally, no wheezing, rhonchi or rales Abd: soft, nontender, no hepatomegaly Ext: trace edema Skin: warm and dry Neuro:  CNs 2-12 intact, no focal abnormalities noted       ASSESSMENT AND PLAN:  1. PAF maintaining sinus bradycardia - Decrease metoprolol to 25mg  BID due to bradycardia in the mid 40's which makes her feel bad - continue Eliquis - I will get a copy of most recent labs done by Dr. Posey Pronto 2. HTN borderline controlled here today but at home it is controlled and runs around 130/6mmHg - continue Prinizide/Metoprolol - increase Amlodpine to 5mg  daily since we are cutting her metoprolol dose back - I have asked her to check her BP daily for a week and call with the results 3. OSA on CPAP and tolerating well - she will bring her card by for a download 4. Mild AS  Followup with me in 6 months  Signed, Fransico Him, MD 12/13/2013 1:56 PM

## 2013-12-18 ENCOUNTER — Telehealth: Payer: Self-pay | Admitting: Cardiology

## 2013-12-18 DIAGNOSIS — G4733 Obstructive sleep apnea (adult) (pediatric): Secondary | ICD-10-CM

## 2013-12-18 NOTE — Telephone Encounter (Signed)
Message copied by Alcario Drought on Tue Dec 18, 2013 10:11 AM ------      Message from: Fransico Him R      Created: Tue Dec 18, 2013  8:50 AM       Please order a chin strap and get a download in 4 weeks ------

## 2013-12-18 NOTE — Telephone Encounter (Signed)
Pt aware, referral to DME other printed and manually faxed to [facility].

## 2013-12-26 ENCOUNTER — Ambulatory Visit (INDEPENDENT_AMBULATORY_CARE_PROVIDER_SITE_OTHER): Payer: Medicare Other | Admitting: Endocrinology

## 2013-12-26 ENCOUNTER — Other Ambulatory Visit: Payer: Self-pay | Admitting: *Deleted

## 2013-12-26 ENCOUNTER — Encounter: Payer: Self-pay | Admitting: Endocrinology

## 2013-12-26 VITALS — BP 132/40 | HR 62 | Temp 97.9°F | Resp 16 | Ht 61.0 in | Wt 227.0 lb

## 2013-12-26 DIAGNOSIS — E785 Hyperlipidemia, unspecified: Secondary | ICD-10-CM

## 2013-12-26 DIAGNOSIS — N183 Chronic kidney disease, stage 3 unspecified: Secondary | ICD-10-CM

## 2013-12-26 DIAGNOSIS — IMO0001 Reserved for inherently not codable concepts without codable children: Secondary | ICD-10-CM

## 2013-12-26 DIAGNOSIS — I10 Essential (primary) hypertension: Secondary | ICD-10-CM

## 2013-12-26 DIAGNOSIS — E1165 Type 2 diabetes mellitus with hyperglycemia: Secondary | ICD-10-CM

## 2013-12-26 DIAGNOSIS — E119 Type 2 diabetes mellitus without complications: Secondary | ICD-10-CM

## 2013-12-26 LAB — COMPREHENSIVE METABOLIC PANEL
ALT: 21 U/L (ref 0–35)
AST: 23 U/L (ref 0–37)
Albumin: 3.8 g/dL (ref 3.5–5.2)
Alkaline Phosphatase: 46 U/L (ref 39–117)
BUN: 44 mg/dL — ABNORMAL HIGH (ref 6–23)
CALCIUM: 9.5 mg/dL (ref 8.4–10.5)
CHLORIDE: 105 meq/L (ref 96–112)
CO2: 25 mEq/L (ref 19–32)
Creatinine, Ser: 1.4 mg/dL — ABNORMAL HIGH (ref 0.4–1.2)
GFR: 37.93 mL/min — AB (ref >60.00)
Glucose, Bld: 146 mg/dL — ABNORMAL HIGH (ref 70–99)
Potassium: 4.7 mEq/L (ref 3.5–5.1)
SODIUM: 137 meq/L (ref 135–145)
TOTAL PROTEIN: 6.9 g/dL (ref 6.0–8.3)
Total Bilirubin: 0.4 mg/dL (ref 0.3–1.2)

## 2013-12-26 LAB — LIPID PANEL
CHOLESTEROL: 120 mg/dL (ref 0–200)
HDL: 29.5 mg/dL — ABNORMAL LOW (ref >39.00)
LDL Cholesterol: 53 mg/dL (ref 0–99)
Total CHOL/HDL Ratio: 4
Triglycerides: 190 mg/dL — ABNORMAL HIGH (ref 0.0–149.0)
VLDL: 38 mg/dL (ref 0.0–40.0)

## 2013-12-26 LAB — HEMOGLOBIN A1C: HEMOGLOBIN A1C: 6.1 % (ref 4.6–6.5)

## 2013-12-26 LAB — GLUCOSE, POCT (MANUAL RESULT ENTRY): POC GLUCOSE: 165 mg/dL — AB (ref 70–99)

## 2013-12-26 MED ORDER — GLUCOSE BLOOD VI STRP: ORAL_STRIP | Status: DC

## 2013-12-26 NOTE — Progress Notes (Signed)
Patient ID: Tina Hester, female   DOB: [birth year], 73 y.o.   MRN: [id number]   Reason for Appointment: Diabetes follow-up   History of Present Illness   Diagnosis: Type 2 DIABETES MELITUS, long-standing    Previous history: Her old records are not available and details not known. However she has been on insulin for a few years. Because of cost she has been switched from Lantus to NPH insulin twice a day She had previously been taking metformin but this was stopped when she had renal insufficiency Overall her blood sugars have been difficult to control and she has previously had relatively poor control. However with adding Actos several months ago her blood sugars had started improving significantly  Recent history: Her blood sugars have improved significantly since her last visit She thinks this is from her going on a calorie restricted diet of about 1000-1200 calories a day without snacks She has been going to [employer] and has lost a significant amount of weight Because of tendency to hypoglycemia she has stopped taking her Humalog and meal times and also her morning NPH Currently taking bedtime NPH 40-45 units based on what she is eating Previously was taking about 160 units a day total. However has not checked her blood sugar after meals much especially during the day      Oral hypoglycemic drugs: Actos, Metformin        Side effects from medications: None, no edema with Actos Insulin regimen: Humulin N 40 hs Humalog before meals 0     Proper timing of medications in relation to meals: Yes.           Monitors blood glucose: Once a day.    Glucometer:  FreeStyle       Blood Glucose readings from meter download:   PREMEAL Breakfast Lunch Dinner Bedtime Overall  Glucose range:  63-116     116-202   63-202   Mean/median:  923    105   Hypoglycemia: Has 3 episodes with blood sugars in the 60s in the morning Meals: 2-3 meals per day.  [employer] diet, usually oatmeal in  the morning         Physical activity: exercise: walking some, regularly now            Wt Readings from Last 3 Encounters:  12/26/13 227 lb (102.967 kg)  12/13/13 227 lb (102.967 kg)  09/04/13 249 lb 9.6 oz (113.218 kg)   LABS:  Lab Results  Component Value Date   HGBA1C 6.7* 09/04/2013   HGBA1C 6.8* 05/31/2013   Lab Results  Component Value Date   MICROALBUR 5.3* 05/31/2013   CREATININE 1.4* 09/04/2013       Medication List       This list is accurate as of: 12/26/13  1:32 PM.  Always use your most recent med list.               acyclovir 400 MG tablet  Commonly known as:  ZOVIRAX     amLODipine 5 MG tablet  Commonly known as:  NORVASC  Take 1 tablet (5 mg total) by mouth daily.     apixaban 5 MG Tabs tablet  Commonly known as:  ELIQUIS  Take 1 tablet (5 mg total) by mouth 2 (two) times daily.     atorvastatin 40 MG tablet  Commonly known as:  LIPITOR  Take 1 tablet (40 mg total) by mouth daily at 6 PM.     COLCRYS 0.6 MG tablet  Generic drug:  colchicine  as needed.     fenofibrate 145 MG tablet  Commonly known as:  TRICOR  Take 145 mg by mouth. TAKING 3 TIMES A WEEK     glucose blood test strip  Commonly known as:  FREESTYLE LITE  Use as instructed to check blood sugars one time a day dx code 250.00     insulin NPH Human 100 UNIT/ML injection  Commonly known as:  HUMULIN N,NOVOLIN N  Inject 45 Units into the skin at bedtime.     INSULIN SYRINGE .5CC/30GX5/16" 30G X 5/16" 0.5 ML Misc  Use 2 needles per day to inject insulin     levothyroxine 137 MCG tablet  Commonly known as:  SYNTHROID, LEVOTHROID  Take 137 mcg by mouth daily before breakfast.     lisinopril-hydrochlorothiazide 20-25 MG per tablet  Commonly known as:  PRINZIDE,ZESTORETIC  Take 1 tablet by mouth daily.     metFORMIN 500 MG tablet  Commonly known as:  GLUCOPHAGE  Take 1 tablet (500 mg total) by mouth daily after supper.     metoprolol succinate 25 MG 24 hr tablet  Commonly  known as:  TOPROL-XL  TAKE 1 TABLET BY MOUTH TWICE DAILY     NEEDLE (DISP) 30 G 30G X 1/2" Misc  Commonly known as:  BD DISP NEEDLES  1 each by Does not apply route 2 (two) times daily before a meal.     ONETOUCH DELICA LANCETS 64Q Misc  Use to check blood sugars once daily     pioglitazone 15 MG tablet  Commonly known as:  ACTOS  Take 1 tablet (15 mg total) by mouth daily.     prednisoLONE acetate 1 % ophthalmic suspension  Commonly known as:  PRED FORTE     ULORIC 40 MG tablet  Generic drug:  febuxostat  40 mg.        Allergies:  Allergies  Allergen Reactions  . Pneumovax 23 [Pneumococcal Vac Polyvalent] Anaphylaxis    weakness  . Sulfa Antibiotics Hives    Past Medical History  Diagnosis Date  . Diabetes mellitus without complication     type 2  . GERD (gastroesophageal reflux disease)   . Hyperlipidemia   . Palpitations   . OSA on CPAP   . Anxiety   . TIA (transient ischemic attack)     remote history of TIA's early 2000  . Anemia     s/p Heme work -up normal EGD and colonscopy in 2012 per pt,neg SPEP  . HSV-1 (herpes simplex virus 1) infection     Acyclovir prn  . Joint pain     osteoarthritis by Xray- possible erosion of R 4th MCP,elevated uric  acid ,ANA +   . Morbid obesity   . Gout   . Osteopenia   . Pain management     Neurosurg: Dr Clydell Hakim  . CKD (chronic kidney disease), stage III   . Aortic valve disorders   . Hypertension   . PAF (paroxysmal atrial fibrillation)     No past surgical history on file.  Family History  Problem Relation Age of Onset  . Arrhythmia Brother     Social History:  reports that she has never smoked. She has never used smokeless tobacco. She reports that she does not drink alcohol or use illicit drugs.  Review of Systems:  Hypertension:  blood pressure is quite normal, currently taking Zestoretic, metoprolol and amlodipine. Her metoprolol was reduced because of bradycardia  Lipids: Triglycerides have  been high, was started on fenofibrate previously, LDL has been below 100 with Lipitor  Lab Results  Component Value Date   CHOL 120 12/26/2013   HDL 29.50* 12/26/2013   LDLCALC 53 12/26/2013   LDLDIRECT 97.2 05/31/2013   TRIG 190.0* 12/26/2013   CHOLHDL 4 12/26/2013    Having less problems with pain in her legs and feet and is able to walk a little  Renal insufficiency: Her creatinine has been about 1.4, still on 20 mg lisinopril, she thinks she is followed by nephrologist but no records are available   Examination:   BP 132/40  Pulse 62  Temp(Src) 97.9 F (36.6 C)  Resp 16  Ht 5\' 1"  (1.549 m)  Wt 227 lb (102.967 kg)  BMI 42.91 kg/m2  SpO2 97%  Body mass index is 42.91 kg/(m^2).    No edema  ASSESSMENT/ PLAN:   Diabetes type 2:   Blood glucose control is overall excellent now with her reducing her caloric intake significantly and losing weight Is also able to start walking now to help with exercise which she was limited with previously See history of present illness for evaluation of her current management, blood sugar patterns and problems identified She has been able to reduce her insulin dose from her previous total of 160 units to only 40 units with bedtime NPH only She is also on Actos and 500 mg metformin A1c to be checked today She is not doing readings after meals much but does not appear to have higher readings; glucoses 165 in the office but is just after eating Metformin dose to be decided on today's renal function results Discussed possibly needing to cover meals with Humalog insulin if postprandial readings significantly high, discussed timing of glucose monitoring, blood sugar targets, balanced meals and oral medications  CKD: Creatinine has been about 1.4: she has been on lisinopril HCT for hypertension and no changes made by nephrologist recently. Will request records  HYPERLIPIDEMIA: She is on Lipitor and fenofibrate, to have lipids checked again  today  Counseling time over 50% of today's 25 minute visit    Elayne Snare 12/26/2013, 1:32 PM   Addendum: A1c down to 6.1, triglycerides better Creatinine is stable at 1.4 she can increase metformin to twice a day for better glucose control in the day  Office Visit on 12/26/2013  Component Date Value Ref Range Status  . POC Glucose 12/26/2013 165* 70 - 99 mg/dl Final  . Hemoglobin A1C 12/26/2013 6.1  4.6 - 6.5 % Final   Glycemic Control Guidelines for People with Diabetes:Non Diabetic:  <6%Goal of Therapy: <7%Additional Action Suggested:  >8%   . Cholesterol 12/26/2013 120  0 - 200 mg/dL Final   ATP III Classification       Desirable:  < 200 mg/dL               Borderline High:  200 - 239 mg/dL          High:  > = 240 mg/dL  . Triglycerides 12/26/2013 190.0* 0.0 - 149.0 mg/dL Final   Normal:  <150 mg/dLBorderline High:  150 - 199 mg/dL  . HDL 12/26/2013 29.50* >39.00 mg/dL Final  . VLDL 12/26/2013 38.0  0.0 - 40.0 mg/dL Final  . LDL Cholesterol 12/26/2013 53  0 - 99 mg/dL Final  . Total CHOL/HDL Ratio 12/26/2013 4   Final                  Men  Women1/2 Average Risk     3.4          3.3Average Risk          5.0          4.42X Average Risk          9.6          7.13X Average Risk          15.0          11.0                      . Sodium 12/26/2013 137  135 - 145 mEq/L Final  . Potassium 12/26/2013 4.7  3.5 - 5.1 mEq/L Final  . Chloride 12/26/2013 105  96 - 112 mEq/L Final  . CO2 12/26/2013 25  19 - 32 mEq/L Final  . Glucose, Bld 12/26/2013 146* 70 - 99 mg/dL Final  . BUN 12/26/2013 44* 6 - 23 mg/dL Final  . Creatinine, Ser 12/26/2013 1.4* 0.4 - 1.2 mg/dL Final  . Total Bilirubin 12/26/2013 0.4  0.3 - 1.2 mg/dL Final  . Alkaline Phosphatase 12/26/2013 46  39 - 117 U/L Final  . AST 12/26/2013 23  0 - 37 U/L Final  . ALT 12/26/2013 21  0 - 35 U/L Final  . Total Protein 12/26/2013 6.9  6.0 - 8.3 g/dL Final  . Albumin 12/26/2013 3.8  3.5 - 5.2 g/dL Final  . Calcium 12/26/2013 9.5   8.4 - 10.5 mg/dL Final  . GFR 12/26/2013 37.93* >60.00 mL/min Final

## 2013-12-26 NOTE — Patient Instructions (Signed)
Insulin 40 at night and if sugar in ams <90 go down to 35  Please check blood sugars at least half the time about 2 hours after any meal and as directed on waking up.  Please bring blood sugar monitor to each visit

## 2013-12-27 NOTE — Progress Notes (Signed)
Quick Note:  A1c 6.1, excellent, kidney test same as before, may take metformin twice a day ______

## 2014-01-01 ENCOUNTER — Telehealth: Payer: Self-pay | Admitting: *Deleted

## 2014-01-01 NOTE — Telephone Encounter (Signed)
Patient was approved for her ELIQUIS through [facility] through 08/29/2014

## 2014-01-09 ENCOUNTER — Other Ambulatory Visit: Payer: Self-pay | Admitting: Endocrinology

## 2014-03-15 ENCOUNTER — Telehealth: Payer: Self-pay

## 2014-03-15 NOTE — Telephone Encounter (Signed)
Patient called for samples of eliquis 5 mg placed them at the front desk

## 2014-03-25 ENCOUNTER — Other Ambulatory Visit (INDEPENDENT_AMBULATORY_CARE_PROVIDER_SITE_OTHER): Payer: Medicare Other

## 2014-03-25 DIAGNOSIS — E119 Type 2 diabetes mellitus without complications: Secondary | ICD-10-CM

## 2014-03-25 LAB — URINALYSIS, ROUTINE W REFLEX MICROSCOPIC
Bilirubin Urine: NEGATIVE
Hgb urine dipstick: NEGATIVE
KETONES UR: NEGATIVE
NITRITE: POSITIVE — AB
PH: 6 (ref 5.0–8.0)
RBC / HPF: NONE SEEN (ref 0–?)
Specific Gravity, Urine: <=1.005 — AB (ref 1.000–1.030)
TOTAL PROTEIN, URINE-UPE24: NEGATIVE
Urine Glucose: NEGATIVE
Urobilinogen, UA: 1 (ref 0.0–1.0)

## 2014-03-25 LAB — BASIC METABOLIC PANEL
BUN: 36 mg/dL — AB (ref 6–23)
CALCIUM: 9 mg/dL (ref 8.4–10.5)
CO2: 23 mEq/L (ref 19–32)
CREATININE: 1.1 mg/dL (ref 0.4–1.2)
Chloride: 105 mEq/L (ref 96–112)
GFR: 49.63 mL/min — ABNORMAL LOW (ref >60.00)
GLUCOSE: 140 mg/dL — AB (ref 70–99)
Potassium: 4.7 mEq/L (ref 3.5–5.1)
Sodium: 136 mEq/L (ref 135–145)

## 2014-03-25 LAB — MICROALBUMIN / CREATININE URINE RATIO
Creatinine,U: 59.2 mg/dL
Microalb Creat Ratio: 1.7 mg/g (ref 0.0–30.0)
Microalb, Ur: 1 mg/dL (ref 0.0–1.9)

## 2014-03-25 LAB — HEMOGLOBIN A1C: Hgb A1c MFr Bld: 6.4 % (ref 4.6–6.5)

## 2014-03-28 ENCOUNTER — Ambulatory Visit: Payer: Medicare Other | Admitting: Endocrinology

## 2014-05-09 ENCOUNTER — Ambulatory Visit (INDEPENDENT_AMBULATORY_CARE_PROVIDER_SITE_OTHER): Payer: Medicare Other | Admitting: Endocrinology

## 2014-05-09 ENCOUNTER — Encounter: Payer: Self-pay | Admitting: Endocrinology

## 2014-05-09 VITALS — BP 138/52 | HR 82 | Temp 98.4°F | Resp 16 | Ht 61.0 in | Wt 215.2 lb

## 2014-05-09 DIAGNOSIS — Z23 Encounter for immunization: Secondary | ICD-10-CM

## 2014-05-09 DIAGNOSIS — I10 Essential (primary) hypertension: Secondary | ICD-10-CM

## 2014-05-09 DIAGNOSIS — E119 Type 2 diabetes mellitus without complications: Secondary | ICD-10-CM

## 2014-05-09 NOTE — Patient Instructions (Signed)
Metformin both at dinner and if am sugar <110 reduce insulin by 4 units

## 2014-05-09 NOTE — Progress Notes (Signed)
Patient ID: Tina Hester, female   DOB: [birth year], 73 y.o.   MRN: [id number]   Reason for Appointment: Diabetes follow-up   History of Present Illness   Diagnosis: Type 2 DIABETES MELITUS, long-standing    Previous history:  She has been on insulin for a few years. Because of cost she has been switched from Lantus to NPH insulin twice a day She had previously been taking metformin but this was stopped when she had renal insufficiency Overall her blood sugars have been difficult to control and she has previously had relatively poor control. However with adding Actos  her blood sugars had started improving significantly  Recent history: She has not been seen in followup since 4/15 Also her A1c has not been checked since 7/15  Her blood sugars have continued to be well controlled since she had modify her diet significantly earlier this year On this visit she had lost significant amount of weight  She is on a calorie restricted diet of about 27 points, probably 1000-1200 calories a day without snacks She has been going to [employer]  again Currently taking bedtime NPH as her only insulin regimen with good control Previously was taking about 160 units of insulin a day total. On her last visit metformin was increased to twice a day since renal function was relatively better However has not checked her blood sugar for the last month as her meter is not working when she was out of town and she did not call for a new prescription She thinks she may feel a little hypoglycemic sometime during the day Also she is eating only a salad at lunchtime she will not take her metformin at lunch      Oral hypoglycemic drugs: Actos, Metformin bid       Side effects from medications: None, no edema with 15 mg Actos Insulin regimen: Humulin N 40 hs           Monitors blood glucose:  none recently     Glucometer:  FreeStyle       Blood Glucose readings from recall from previous testing Am 110-120, pm  140   Hypoglycemia: Has had 2 possible episodes without documented low readings Meals: 2-3 meals per day.  Weight [employer]chers diet, usually egg and toast      Physical activity: exercise: walking 20 min regularly now            Wt Readings from Last 3 Encounters:  05/09/14 215 lb 3.2 oz (97.614 kg)  12/26/13 227 lb (102.967 kg)  12/13/13 227 lb (102.967 kg)   LABS:  Lab Results  Component Value Date   HGBA1C 6.4 03/25/2014   HGBA1C 6.1 12/26/2013   HGBA1C 6.7* 09/04/2013   Lab Results  Component Value Date   MICROALBUR 1.0 03/25/2014   LDLCALC 53 12/26/2013   CREATININE 1.1 03/25/2014       Medication List       This list is accurate as of: 05/09/14  4:19 PM.  Always use your most recent med list.               acyclovir 400 MG tablet  Commonly known as:  ZOVIRAX     amLODipine 5 MG tablet  Commonly known as:  NORVASC  Take 1 tablet (5 mg total) by mouth daily.     apixaban 5 MG Tabs tablet  Commonly known as:  ELIQUIS  Take 1 tablet (5 mg total) by mouth 2 (two) times daily.  atorvastatin 40 MG tablet  Commonly known as:  LIPITOR  Take 1 tablet (40 mg total) by mouth daily at 6 PM.     COLCRYS 0.6 MG tablet  Generic drug:  colchicine  as needed.     fenofibrate 145 MG tablet  Commonly known as:  TRICOR  Take 145 mg by mouth. TAKING 3 TIMES A WEEK     glucose blood test strip  Commonly known as:  FREESTYLE LITE  Use as instructed to check blood sugars one time a day dx code 250.00     insulin NPH Human 100 UNIT/ML injection  Commonly known as:  HUMULIN N,NOVOLIN N  Inject 40 Units into the skin at bedtime.     INSULIN SYRINGE .5CC/30GX5/16" 30G X 5/16" 0.5 ML Misc  Use 2 needles per day to inject insulin     levothyroxine 137 MCG tablet  Commonly known as:  SYNTHROID, LEVOTHROID  Take 137 mcg by mouth daily before breakfast.     lisinopril-hydrochlorothiazide 20-25 MG per tablet  Commonly known as:  PRINZIDE,ZESTORETIC  Take 1 tablet by mouth  daily.     metFORMIN 500 MG tablet  Commonly known as:  GLUCOPHAGE  Take 1 tablet (500 mg total) by mouth daily after supper.     metoprolol succinate 25 MG 24 hr tablet  Commonly known as:  TOPROL-XL  TAKE 1 TABLET BY MOUTH TWICE DAILY     NEEDLE (DISP) 30 G 30G X 1/2" Misc  Commonly known as:  BD DISP NEEDLES  1 each by Does not apply route 2 (two) times daily before a meal.     ONETOUCH DELICA LANCETS 58N Misc  Use to check blood sugars once daily     pioglitazone 15 MG tablet  Commonly known as:  ACTOS  TAKE 1 TABLET BY MOUTH EVERY DAY     prednisoLONE acetate 1 % ophthalmic suspension  Commonly known as:  PRED FORTE     ULORIC 40 MG tablet  Generic drug:  febuxostat  40 mg.        Allergies:  Allergies  Allergen Reactions  . Pneumovax 23 [Pneumococcal Vac Polyvalent] Anaphylaxis    weakness  . Sulfa Antibiotics Hives    Past Medical History  Diagnosis Date  . Diabetes mellitus without complication     type 2  . GERD (gastroesophageal reflux disease)   . Hyperlipidemia   . Palpitations   . OSA on CPAP   . Anxiety   . TIA (transient ischemic attack)     remote history of TIA's early 2000  . Anemia     s/p Heme work -up normal EGD and colonscopy in 2012 per pt,neg SPEP  . HSV-1 (herpes simplex virus 1) infection     Acyclovir prn  . Joint pain     osteoarthritis by Xray- possible erosion of R 4th MCP,elevated uric  acid ,ANA +   . Morbid obesity   . Gout   . Osteopenia   . Pain management     Neurosurg: Dr Clydell Hakim  . CKD (chronic kidney disease), stage III   . Aortic valve disorders   . Hypertension   . PAF (paroxysmal atrial fibrillation)     No past surgical history on file.  Family History  Problem Relation Age of Onset  . Arrhythmia Brother     Social History:  reports that she has never smoked. She has never used smokeless tobacco. She reports that she does not drink alcohol or use illicit  drugs.  Review of  Systems:  Hypertension:  blood pressure is quite normal, currently taking Zestoretic, metoprolol and amlodipine. Her metoprolol was reduced because of bradycardia  Lipids: Triglycerides have been high, was started on fenofibrate previously, LDL has been below 100 with Lipitor  Lab Results  Component Value Date   CHOL 120 12/26/2013   HDL 29.50* 12/26/2013   LDLCALC 53 12/26/2013   LDLDIRECT 97.2 05/31/2013   TRIG 190.0* 12/26/2013   CHOLHDL 4 12/26/2013    Having less difficulties with pain in her legs and feet and is able to walk very well now  She feels more energetic especially with weight loss  Renal insufficiency: Her creatinine has been high normal previously but appears better now; she thinks she is followed by nephrologist but no records are available  Lab Results  Component Value Date   CREATININE 1.1 03/25/2014      Examination:   BP 138/52  Pulse 82  Temp(Src) 98.4 F (36.9 C)  Resp 16  Ht 5\' 1"  (1.549 m)  Wt 215 lb 3.2 oz (97.614 kg)  BMI 40.68 kg/m2  SpO2 96%  Body mass index is 40.68 kg/(m^2).    No edema  ASSESSMENT/ PLAN:   Diabetes type 2:   Blood glucose control is overall excellent now with her reducing her caloric intake significantly and losing weight Has lost a significant amount of weight since her last visit Also has been exercising in the last few weeks She thinks fasting blood sugars are generally good with 40 units of NPH which she is doing consistently Blood sugars fairly good in the office today A1c again upper normal when checked in July She will continue the same regimen except trying both metformin at dinner and this may require reduction of her NPH also Discussed timing of medications and insulin as well as adjustment of insulin based on fasting readings New glucose monitor given today  CKD: Creatinine has improved  HYPERLIPIDEMIA: She is on Lipitor and fenofibrate, to have lipids checked on the next visit  Hypertension: Well  controlled  She will have her flu vaccine today     Caci Orren 05/09/2014, 4:19 PM     No visits with results within 1 Week(s) from this visit. Latest known visit with results is:  Appointment on 03/25/2014  Component Date Value Ref Range Status  . Hemoglobin A1C 03/25/2014 6.4  4.6 - 6.5 % Final   Glycemic Control Guidelines for People with Diabetes:Non Diabetic:  <6%Goal of Therapy: <7%Additional Action Suggested:  >8%   . Sodium 03/25/2014 136  135 - 145 mEq/L Final  . Potassium 03/25/2014 4.7  3.5 - 5.1 mEq/L Final  . Chloride 03/25/2014 105  96 - 112 mEq/L Final  . CO2 03/25/2014 23  19 - 32 mEq/L Final  . Glucose, Bld 03/25/2014 140* 70 - 99 mg/dL Final  . BUN 03/25/2014 36* 6 - 23 mg/dL Final  . Creatinine, Ser 03/25/2014 1.1  0.4 - 1.2 mg/dL Final  . Calcium 03/25/2014 9.0  8.4 - 10.5 mg/dL Final  . GFR 03/25/2014 49.63* >60.00 mL/min Final  . Microalb, Ur 03/25/2014 1.0  0.0 - 1.9 mg/dL Final  . Creatinine,U 03/25/2014 59.2   Final  . Microalb Creat Ratio 03/25/2014 1.7  0.0 - 30.0 mg/g Final  . Color, Urine 03/25/2014 YELLOW  Yellow;Lt. Yellow Final  . APPearance 03/25/2014 CLEAR  Clear Final  . Specific Gravity, Urine 03/25/2014 <=1.005* 1.000 - 1.030 Final  . pH 03/25/2014 6.0  5.0 - 8.0  Final  . Total Protein, Urine 03/25/2014 NEGATIVE  Negative Final  . Urine Glucose 03/25/2014 NEGATIVE  Negative Final  . Ketones, ur 03/25/2014 NEGATIVE  Negative Final  . Bilirubin Urine 03/25/2014 NEGATIVE  Negative Final  . Hgb urine dipstick 03/25/2014 NEGATIVE  Negative Final  . Urobilinogen, UA 03/25/2014 1.0  0.0 - 1.0 Final  . Leukocytes, UA 03/25/2014 SMALL* Negative Final  . Nitrite 03/25/2014 POSITIVE* Negative Final  . WBC, UA 03/25/2014 11-20/hpf* 0-2/hpf Final  . RBC / HPF 03/25/2014 none seen  0-2/hpf Final  . Squamous Epithelial / LPF 03/25/2014 Rare(0-4/hpf)  Rare(0-4/hpf) Final  . Bacteria, UA 03/25/2014 Many(>50/hpf)* None Final

## 2014-06-18 ENCOUNTER — Other Ambulatory Visit: Payer: Self-pay | Admitting: Endocrinology

## 2014-06-24 ENCOUNTER — Telehealth: Payer: Self-pay | Admitting: Endocrinology

## 2014-06-24 ENCOUNTER — Other Ambulatory Visit: Payer: Self-pay | Admitting: *Deleted

## 2014-06-24 MED ORDER — METFORMIN HCL 500 MG PO TABS: ORAL_TABLET | ORAL | Status: DC

## 2014-06-24 NOTE — Telephone Encounter (Signed)
Patient would like Rhonda to cal her Retrieved message from voicemail; could not really understand  Voicemail was regarding her medication   Please advise   Thank you

## 2014-07-04 ENCOUNTER — Other Ambulatory Visit (INDEPENDENT_AMBULATORY_CARE_PROVIDER_SITE_OTHER): Payer: Medicare Other

## 2014-07-04 DIAGNOSIS — E119 Type 2 diabetes mellitus without complications: Secondary | ICD-10-CM

## 2014-07-04 LAB — LIPID PANEL
CHOLESTEROL: 133 mg/dL (ref 0–200)
HDL: 22.7 mg/dL — ABNORMAL LOW (ref >39.00)
LDL Cholesterol: 83 mg/dL (ref 0–99)
NONHDL: 110.3
Total CHOL/HDL Ratio: 6
Triglycerides: 135 mg/dL (ref 0.0–149.0)
VLDL: 27 mg/dL (ref 0.0–40.0)

## 2014-07-04 LAB — COMPREHENSIVE METABOLIC PANEL
ALT: 17 U/L (ref 0–35)
AST: 21 U/L (ref 0–37)
Albumin: 3.1 g/dL — ABNORMAL LOW (ref 3.5–5.2)
Alkaline Phosphatase: 69 U/L (ref 39–117)
BUN: 44 mg/dL — ABNORMAL HIGH (ref 6–23)
CALCIUM: 8.9 mg/dL (ref 8.4–10.5)
CHLORIDE: 106 meq/L (ref 96–112)
CO2: 24 meq/L (ref 19–32)
Creatinine, Ser: 1.2 mg/dL (ref 0.4–1.2)
GFR: 45.43 mL/min — AB (ref >60.00)
Glucose, Bld: 83 mg/dL (ref 70–99)
Potassium: 4.8 mEq/L (ref 3.5–5.1)
SODIUM: 139 meq/L (ref 135–145)
Total Bilirubin: 0.5 mg/dL (ref 0.2–1.2)
Total Protein: 6.8 g/dL (ref 6.0–8.3)

## 2014-07-04 LAB — MICROALBUMIN / CREATININE URINE RATIO
CREATININE, U: 83.9 mg/dL
MICROALB UR: 0.7 mg/dL (ref 0.0–1.9)
MICROALB/CREAT RATIO: 0.8 mg/g (ref 0.0–30.0)

## 2014-07-04 LAB — HEMOGLOBIN A1C: Hgb A1c MFr Bld: 6.4 % (ref 4.6–6.5)

## 2014-07-09 ENCOUNTER — Ambulatory Visit: Payer: Medicare Other | Admitting: Endocrinology

## 2014-07-11 ENCOUNTER — Ambulatory Visit (INDEPENDENT_AMBULATORY_CARE_PROVIDER_SITE_OTHER): Payer: Medicare Other | Admitting: Endocrinology

## 2014-07-11 ENCOUNTER — Encounter: Payer: Self-pay | Admitting: Endocrinology

## 2014-07-11 ENCOUNTER — Other Ambulatory Visit: Payer: Self-pay | Admitting: *Deleted

## 2014-07-11 VITALS — BP 130/51 | HR 60 | Temp 98.2°F | Resp 14 | Ht 61.0 in | Wt 215.4 lb

## 2014-07-11 DIAGNOSIS — E063 Autoimmune thyroiditis: Secondary | ICD-10-CM

## 2014-07-11 DIAGNOSIS — E119 Type 2 diabetes mellitus without complications: Secondary | ICD-10-CM

## 2014-07-11 DIAGNOSIS — N289 Disorder of kidney and ureter, unspecified: Secondary | ICD-10-CM

## 2014-07-11 DIAGNOSIS — E782 Mixed hyperlipidemia: Secondary | ICD-10-CM

## 2014-07-11 DIAGNOSIS — E038 Other specified hypothyroidism: Secondary | ICD-10-CM

## 2014-07-11 DIAGNOSIS — I10 Essential (primary) hypertension: Secondary | ICD-10-CM

## 2014-07-11 MED ORDER — "NEEDLE (DISP) 30G X 1/2"" MISC": 1.0000 | Freq: Two times a day (BID) | Status: DC

## 2014-07-11 NOTE — Progress Notes (Signed)
Patient ID: Tina Hester, female   DOB: 1940/08/31, 73 y.o.   MRN: [id number]   Reason for Appointment: Diabetes follow-up   History of Present Illness   Diagnosis: Type 2 DIABETES MELITUS, long-standing    Previous history:  She has been on insulin for a few years. Because of cost she has been switched from Lantus to NPH insulin twice a day She had previously been taking metformin but this was stopped when she had renal insufficiency Overall her blood sugars have been difficult to control and she has previously had relatively poor control. However with adding Actos  her blood sugars had started improving significantly  Recent history:    Her blood sugars have been well controlled since she had significantly improved her diet earlier this year Her A1c has been consistently upper normal On her follow-up in 9/15 she had lost significant amount of weight but this is leveled off She had been on a calorie restricted diet of about 27 points, probably 1000-1200 calories a day without snacks  Still taking bedtime NPH as her only insulin regimen with good control Previously was taking about 160 units of insulin a day total. She is also still taking  The same dose metformin.  Also continues to take Actos without side effects She is however checking her blood sugars very sporadically She thinks this is from her being busy trying to move and is also relatively active No hypoglycemia recently except for once when she had a very light meal in the evening and another episode when she had a episode of diarrhea during the night Her activity is limited only by her joint pains      Oral hypoglycemic drugs: Actos, Metformin bid       Side effects from medications: None, no edema with 15 mg Actos Insulin regimen: Humulin N 35 hs           Monitors blood glucose:  none recently     Glucometer:  FreeStyle       Blood Glucose readings from download: Not clear if her monitor has a right date and time and  has sporadic readings with overall range 50-193, average 95  Hypoglycemia: Has had 2 lows overnight as above Meals: 2-3 meals per day.  Weight Watchers diet, usually egg and toast      Physical activity: exercise: walking 20 min regularly             Wt Readings from Last 3 Encounters:  07/11/14 215 lb 6.4 oz (97.705 kg)  05/09/14 215 lb 3.2 oz (97.614 kg)  12/26/13 227 lb (102.967 kg)   LABS:  Lab Results  Component Value Date   HGBA1C 6.4 07/04/2014   HGBA1C 6.4 03/25/2014   HGBA1C 6.1 12/26/2013   Lab Results  Component Value Date   MICROALBUR 0.7 07/04/2014   LDLCALC 83 07/04/2014   CREATININE 1.2 07/04/2014       Medication List       This list is accurate as of: 07/11/14  2:44 PM.  Always use your most recent med list.               acyclovir 400 MG tablet  Commonly known as:  ZOVIRAX     amLODipine 5 MG tablet  Commonly known as:  NORVASC  Take 1 tablet (5 mg total) by mouth daily.     apixaban 5 MG Tabs tablet  Commonly known as:  ELIQUIS  Take 1 tablet (5 mg total) by mouth 2 (two)  times daily.     atorvastatin 40 MG tablet  Commonly known as:  LIPITOR  Take 1 tablet (40 mg total) by mouth daily at 6 PM.     CALCIUM PO  Take by mouth.     COLCRYS 0.6 MG tablet  Generic drug:  colchicine  as needed.     fenofibrate 145 MG tablet  Commonly known as:  TRICOR  Take 145 mg by mouth. TAKING 3 TIMES A WEEK     glucose blood test strip  Commonly known as:  FREESTYLE LITE  Use as instructed to check blood sugars one time a day dx code 250.00     insulin NPH Human 100 UNIT/ML injection  Commonly known as:  HUMULIN N,NOVOLIN N  Inject 35 Units into the skin at bedtime.     INSULIN SYRINGE .5CC/30GX5/16" 30G X 5/16" 0.5 ML Misc  Use 2 needles per day to inject insulin     levothyroxine 137 MCG tablet  Commonly known as:  SYNTHROID, LEVOTHROID  Take 137 mcg by mouth daily before breakfast.     lisinopril-hydrochlorothiazide 20-25 MG per tablet   Commonly known as:  PRINZIDE,ZESTORETIC  Take 1 tablet by mouth daily.     metFORMIN 500 MG tablet  Commonly known as:  GLUCOPHAGE  Take 2 tablets every evening     metoprolol succinate 25 MG 24 hr tablet  Commonly known as:  TOPROL-XL  TAKE 1 TABLET BY MOUTH TWICE DAILY     NEEDLE (DISP) 30 G 30G X 1/2" Misc  Commonly known as:  BD DISP NEEDLES  1 each by Does not apply route 2 (two) times daily before a meal.     ONETOUCH DELICA LANCETS 14N Misc  Use to check blood sugars once daily     pioglitazone 15 MG tablet  Commonly known as:  ACTOS  TAKE 1 TABLET BY MOUTH EVERY DAY     prednisoLONE acetate 1 % ophthalmic suspension  Commonly known as:  PRED FORTE  Place 4 drops into the right eye.     ULORIC 40 MG tablet  Generic drug:  febuxostat  40 mg.        Allergies:  Allergies  Allergen Reactions  . Pneumovax 23 [Pneumococcal Vac Polyvalent] Anaphylaxis    weakness  . Sulfa Antibiotics Hives    Past Medical History  Diagnosis Date  . Diabetes mellitus without complication     type 2  . GERD (gastroesophageal reflux disease)   . Hyperlipidemia   . Palpitations   . OSA on CPAP   . Anxiety   . TIA (transient ischemic attack)     remote history of TIA's early 2000  . Anemia     s/p Heme work -up normal EGD and colonscopy in 2012 per pt,neg SPEP  . HSV-1 (herpes simplex virus 1) infection     Acyclovir prn  . Joint pain     osteoarthritis by Xray- possible erosion of R 4th MCP,elevated uric  acid ,ANA +   . Morbid obesity   . Gout   . Osteopenia   . Pain management     Neurosurg: Dr Clydell Hakim  . CKD (chronic kidney disease), stage III   . Aortic valve disorders   . Hypertension   . PAF (paroxysmal atrial fibrillation)     No past surgical history on file.  Family History  Problem Relation Age of Onset  . Arrhythmia Brother     Social History:  reports that she has never  smoked. She has never used smokeless tobacco. She reports that she does  not drink alcohol or use illicit drugs.  Review of Systems:  Hypertension:  blood pressure is quite normal, currently taking Zestoretic, metoprolol and amlodipine. Her metoprolol was reduced because of bradycardia  Lipids: Triglycerides have been high, now controlled on fenofibrate, LDL has been below 100 with Lipitor 40 mg which she is tolerating well  Lab Results  Component Value Date   CHOL 133 07/04/2014   HDL 22.70* 07/04/2014   LDLCALC 83 07/04/2014   LDLDIRECT 97.2 05/31/2013   TRIG 135.0 07/04/2014   CHOLHDL 6 07/04/2014    Having less difficulties with pain in her legs and feet and is able to walk   Asking about taking Celebrex or other arthritis pills because of her hip pain  She feels more energetic especially with weight loss  Renal insufficiency: Her creatinine has been high normal previously but appears fairly good consistently;  she is followed by nephrologist but no records are available  Lab Results  Component Value Date   CREATININE 1.2 07/04/2014   She has hypothyroidism and is taking 137 g, has not had any follow-up with her PCP for this  Lab Results  Component Value Date   TSH 1.28 06/19/2013      Examination:   BP 130/51 mmHg  Pulse 60  Temp(Src) 98.2 F (36.8 C)  Resp 14  Ht 5\' 1"  (1.549 m)  Wt 215 lb 6.4 oz (97.705 kg)  BMI 40.72 kg/m2  SpO2 97%  Body mass index is 40.72 kg/(m^2).    No edema  ASSESSMENT/ PLAN:   Diabetes type 2:   Blood glucose control is overall excellent this year with her reducing her caloric intake significantly and losing weight Also has been able to do walking and other activities fairly consistently Although her A1c is still upper normal she is not checking her blood sugars much at home and difficult to know if she is having any was prandial hyperglycemia She appears to have fasting blood sugars that are generally good with 35 units of NPH which she is doing consistently Difficult to analyze her monitor as  she does not have the correct date or time programmed  She may need a little less NPH especially if that sugars in the mornings a low normal Discussed needing to check blood sugars more consistently at variable times  CKD: Creatinine has leveled off at 1.2  Joint pains: Advised that she can take low-dose Celebrex if it will help her joint pain significantly  HYPERLIPIDEMIA: She is on Lipitor and fenofibrate and will need to continue the same regimen as triglycerides are excellent, however HDL is still very low  Hypertension: Well controlled  Hypothyroidism: She does need a follow-up TSH and this will be done on the next visit     [facility] 07/11/2014, 2:44 PM     No visits with results within 1 Week(s) from this visit. Latest known visit with results is:  Appointment on 07/04/2014  Component Date Value Ref Range Status  . Hgb A1c MFr Bld 07/04/2014 6.4  4.6 - 6.5 % Final   Glycemic Control Guidelines for People with Diabetes:Non Diabetic:  <6%Goal of Therapy: <7%Additional Action Suggested:  >8%   . Sodium 07/04/2014 139  135 - 145 mEq/L Final  . Potassium 07/04/2014 4.8  3.5 - 5.1 mEq/L Final  . Chloride 07/04/2014 106  96 - 112 mEq/L Final  . CO2 07/04/2014 24  19 - 32 mEq/L Final  .  Glucose, Bld 07/04/2014 83  70 - 99 mg/dL Final  . BUN 07/04/2014 44* 6 - 23 mg/dL Final  . Creatinine, Ser 07/04/2014 1.2  0.4 - 1.2 mg/dL Final  . Total Bilirubin 07/04/2014 0.5  0.2 - 1.2 mg/dL Final  . Alkaline Phosphatase 07/04/2014 69  39 - 117 U/L Final  . AST 07/04/2014 21  0 - 37 U/L Final  . ALT 07/04/2014 17  0 - 35 U/L Final  . Total Protein 07/04/2014 6.8  6.0 - 8.3 g/dL Final  . Albumin 07/04/2014 3.1* 3.5 - 5.2 g/dL Final  . Calcium 07/04/2014 8.9  8.4 - 10.5 mg/dL Final  . GFR 07/04/2014 45.43* >60.00 mL/min Final  . Cholesterol 07/04/2014 133  0 - 200 mg/dL Final   ATP III Classification       Desirable:  < 200 mg/dL               Borderline High:  200 - 239 mg/dL           High:  > = 240 mg/dL  . Triglycerides 07/04/2014 135.0  0.0 - 149.0 mg/dL Final   Normal:  <150 mg/dLBorderline High:  150 - 199 mg/dL  . HDL 07/04/2014 22.70* >39.00 mg/dL Final  . VLDL 07/04/2014 27.0  0.0 - 40.0 mg/dL Final  . LDL Cholesterol 07/04/2014 83  0 - 99 mg/dL Final  . Total CHOL/HDL Ratio 07/04/2014 6   Final                  Men          Women1/2 Average Risk     3.4          3.3Average Risk          5.0          4.42X Average Risk          9.6          7.13X Average Risk          15.0          11.0                      . NonHDL 07/04/2014 110.30   Final   NOTE:  Non-HDL goal should be 30 mg/dL higher than patient's LDL goal (i.e. LDL goal of < 70 mg/dL, would have non-HDL goal of < 100 mg/dL)  . Microalb, Ur 07/04/2014 0.7  0.0 - 1.9 mg/dL Final  . Creatinine,U 07/04/2014 83.9   Final  . Microalb Creat Ratio 07/04/2014 0.8  0.0 - 30.0 mg/g Final

## 2014-07-11 NOTE — Patient Instructions (Signed)
Please check blood sugars at least half the time about 2 hours after any meal and 3 times per week on waking up. Please bring blood sugar monitor to each visit  May reduce to 32 units if sugar <80 or low overnight

## 2014-10-07 ENCOUNTER — Other Ambulatory Visit: Payer: Medicare Other

## 2014-10-08 ENCOUNTER — Other Ambulatory Visit: Payer: Self-pay

## 2014-10-09 ENCOUNTER — Other Ambulatory Visit (INDEPENDENT_AMBULATORY_CARE_PROVIDER_SITE_OTHER): Payer: PPO

## 2014-10-09 DIAGNOSIS — E063 Autoimmune thyroiditis: Secondary | ICD-10-CM

## 2014-10-09 DIAGNOSIS — E119 Type 2 diabetes mellitus without complications: Secondary | ICD-10-CM

## 2014-10-09 LAB — MICROALBUMIN / CREATININE URINE RATIO
CREATININE, U: 75.2 mg/dL
MICROALB/CREAT RATIO: 1.7 mg/g (ref 0.0–30.0)
Microalb, Ur: 1.3 mg/dL (ref 0.0–1.9)

## 2014-10-09 LAB — HEMOGLOBIN A1C: Hgb A1c MFr Bld: 6.7 % — ABNORMAL HIGH (ref 4.6–6.5)

## 2014-10-10 ENCOUNTER — Ambulatory Visit: Payer: Medicare Other | Admitting: Endocrinology

## 2014-10-11 ENCOUNTER — Ambulatory Visit: Payer: PPO | Admitting: Endocrinology

## 2014-10-14 ENCOUNTER — Ambulatory Visit: Payer: PPO | Admitting: Endocrinology

## 2014-11-08 ENCOUNTER — Other Ambulatory Visit: Payer: Self-pay | Admitting: Endocrinology

## 2014-11-14 ENCOUNTER — Other Ambulatory Visit: Payer: Self-pay | Admitting: Endocrinology

## 2014-11-14 ENCOUNTER — Ambulatory Visit (INDEPENDENT_AMBULATORY_CARE_PROVIDER_SITE_OTHER): Payer: PPO | Admitting: Endocrinology

## 2014-11-14 ENCOUNTER — Other Ambulatory Visit: Payer: Self-pay

## 2014-11-14 VITALS — BP 148/60 | HR 62 | Temp 97.8°F | Ht 61.0 in | Wt 218.0 lb

## 2014-11-14 DIAGNOSIS — E063 Autoimmune thyroiditis: Secondary | ICD-10-CM

## 2014-11-14 DIAGNOSIS — E119 Type 2 diabetes mellitus without complications: Secondary | ICD-10-CM

## 2014-11-14 DIAGNOSIS — E782 Mixed hyperlipidemia: Secondary | ICD-10-CM

## 2014-11-14 DIAGNOSIS — I10 Essential (primary) hypertension: Secondary | ICD-10-CM

## 2014-11-14 DIAGNOSIS — E038 Other specified hypothyroidism: Secondary | ICD-10-CM

## 2014-11-14 MED ORDER — GLUCOSE BLOOD VI STRP: ORAL_STRIP | Status: DC

## 2014-11-14 NOTE — Patient Instructions (Signed)
Please check blood sugars at least half the time about 2 hours after any meal and 3 times per week on waking up.  Please bring blood sugar monitor to each visit.  Recommended blood sugar levels about 2 hours after meal is 140-180 and on waking up 90-130   

## 2014-11-14 NOTE — Progress Notes (Signed)
Patient ID: Tina Hester, female   DOB: November 10, 1940, 74 y.o.   MRN: [id number]   Reason for Appointment: Diabetes follow-up   History of Present Illness   Diagnosis: Type 2 DIABETES MELITUS, long-standing    Previous history:  She has been on insulin for a few years. Because of cost she has been switched from Lantus to NPH insulin twice a day She had previously been taking metformin but this was stopped when she had renal insufficiency Overall her blood sugars have been difficult to control and she has previously had relatively poor control. However with adding Actos  her blood sugars had started improving significantly  Recent history:    Insulin regimen: Humulin N 30 hs     Her blood sugars have been well controlled since she had significantly improved her diet in 2015 Her A1c has been consistently upper normal but slightly higher now at 6.7 Her weight loss has stopped although she thinks she is still watching her diet fairly well, using a Weight Watchers type diet May not have been as active and wintertime. On her follow-up in 9/15 she had lost significant amount of weight but this is leveled off  Still taking bedtime NPH as her only insulin regimen with good control Previously was taking about 160 units of insulin a day total. She is said that about a week or so ago she thinks she was getting some low sugars overnight and she has reduced her insulin by 5 units She has 2 different glucose monitors and did not bring the one that has her readings Not clear if she is checking any readings after evening meal No recent labs available, glucose nonfasting with PCP was 150 in 1/16 Her activity is limited only by her joint pains      Oral hypoglycemic drugs: Actos, Metformin 500 mg bid       Side effects from medications: None, no edema with 15 mg Actos       Monitors blood glucose:  none recently     Glucometer:  FreeStyle       Blood Glucose readings from download:  She has only 3  readings on her monitor, fasting 107, PC breakfast 146 and suppertime 109  Hypoglycemia: Has had rare overnight low sugars, not recently Meals: 2-3 meals per day.  Weight Watchers diet, usually egg and toast      Physical activity: exercise: walking 15-20 min, recently almost daily        Wt Readings from Last 3 Encounters:  11/14/14 218 lb (98.884 kg)  07/11/14 215 lb 6.4 oz (97.705 kg)  05/09/14 215 lb 3.2 oz (97.614 kg)   LABS:  Lab Results  Component Value Date   HGBA1C 6.7* 10/09/2014   HGBA1C 6.4 07/04/2014   HGBA1C 6.4 03/25/2014   Lab Results  Component Value Date   MICROALBUR 1.3 10/09/2014   LDLCALC 83 07/04/2014   CREATININE 1.2 07/04/2014       Medication List       This list is accurate as of: 11/14/14  4:13 PM.  Always use your most recent med list.               acyclovir 400 MG tablet  Commonly known as:  ZOVIRAX     amLODipine 5 MG tablet  Commonly known as:  NORVASC  Take 1 tablet (5 mg total) by mouth daily.     apixaban 5 MG Tabs tablet  Commonly known as:  ELIQUIS  Take 1 tablet (5  mg total) by mouth 2 (two) times daily.     atorvastatin 40 MG tablet  Commonly known as:  LIPITOR  Take 1 tablet (40 mg total) by mouth daily at 6 PM.     CALCIUM PO  Take by mouth.     COLCRYS 0.6 MG tablet  Generic drug:  colchicine  as needed.     fenofibrate 145 MG tablet  Commonly known as:  TRICOR  Take 145 mg by mouth. TAKING 3 TIMES A WEEK     glucose blood test strip  Commonly known as:  FREESTYLE LITE  Use as instructed to check blood sugars one time a day dx code 250.00     insulin NPH Human 100 UNIT/ML injection  Commonly known as:  HUMULIN N,NOVOLIN N  Inject 35 Units into the skin at bedtime.     HUMULIN N 100 UNIT/ML injection  Generic drug:  insulin NPH Human  INJECT 20 UNITS EVERY MORNING AND 45 UNITS EVERY NIGHT     INSULIN SYRINGE .5CC/30GX5/16" 30G X 5/16" 0.5 ML Misc  Use 2 needles per day to inject insulin      levothyroxine 137 MCG tablet  Commonly known as:  SYNTHROID, LEVOTHROID  Take 137 mcg by mouth daily before breakfast.     lisinopril-hydrochlorothiazide 20-25 MG per tablet  Commonly known as:  PRINZIDE,ZESTORETIC  Take 1 tablet by mouth daily.     metFORMIN 500 MG tablet  Commonly known as:  GLUCOPHAGE  Take 2 tablets every evening     metoprolol succinate 25 MG 24 hr tablet  Commonly known as:  TOPROL-XL  TAKE 1 TABLET BY MOUTH TWICE DAILY     multivitamin capsule  Take 1 capsule by mouth daily.     NEEDLE (DISP) 30 G 30G X 1/2" Misc  Commonly known as:  BD DISP NEEDLES  1 each by Does not apply route 2 (two) times daily before a meal.     ONETOUCH DELICA LANCETS 61Y Misc  Use to check blood sugars once daily     pioglitazone 15 MG tablet  Commonly known as:  ACTOS  TAKE 1 TABLET BY MOUTH EVERY DAY     prednisoLONE acetate 1 % ophthalmic suspension  Commonly known as:  PRED FORTE  Place 4 drops into the right eye.     ULORIC 40 MG tablet  Generic drug:  febuxostat  40 mg.        Allergies:  Allergies  Allergen Reactions  . Pneumovax 23 [Pneumococcal Vac Polyvalent] Anaphylaxis    weakness  . Sulfa Antibiotics Hives    Past Medical History  Diagnosis Date  . Diabetes mellitus without complication     type 2  . GERD (gastroesophageal reflux disease)   . Hyperlipidemia   . Palpitations   . OSA on CPAP   . Anxiety   . TIA (transient ischemic attack)     remote history of TIA's early 2000  . Anemia     s/p Heme work -up normal EGD and colonscopy in 2012 per pt,neg SPEP  . HSV-1 (herpes simplex virus 1) infection     Acyclovir prn  . Joint pain     osteoarthritis by Xray- possible erosion of R 4th MCP,elevated uric  acid ,ANA +   . Morbid obesity   . Gout   . Osteopenia   . Pain management     Neurosurg: Dr Clydell Hakim  . CKD (chronic kidney disease), stage III   . Aortic valve disorders   .  Hypertension   . PAF (paroxysmal atrial fibrillation)      No past surgical history on file.  Family History  Problem Relation Age of Onset  . Arrhythmia Brother     Social History:  reports that she has never smoked. She has never used smokeless tobacco. She reports that she does not drink alcohol or use illicit drugs.  Review of Systems:  Low B12 was diagnosed by PCP and she is taking 1000 g oral supplement  Hypertension:  blood pressure is fairly well controlled She is currently taking Zestoretic, low dose metoprolol and amlodipine 5 mg.   Lipids: Triglycerides have been high, now controlled on fenofibrate, LDL has been below 100 with Lipitor 40 mg which she is tolerating well  Lab Results  Component Value Date   CHOL 133 07/04/2014   HDL 22.70* 07/04/2014   LDLCALC 83 07/04/2014   LDLDIRECT 97.2 05/31/2013   TRIG 135.0 07/04/2014   CHOLHDL 6 07/04/2014     Renal insufficiency: Her creatinine has been high normal previously but appears fairly good consistently;  she is followed by nephrologist  Creatinine from PCP was 1.3 in January    Lab Results  Component Value Date   CREATININE 1.2 07/04/2014   She has hypothyroidism and is taking 137 g, has a TSH of 1.06 in 1/16   Lab Results  Component Value Date   TSH 1.28 06/19/2013      Examination:   BP 148/60 mmHg  Pulse 62  Temp(Src) 97.8 F (36.6 C) (Oral)  Ht 5\' 1"  (1.549 m)  Wt 218 lb (98.884 kg)  BMI 41.21 kg/m2  SpO2 97%  Body mass index is 41.21 kg/(m^2).    No edema  ASSESSMENT/ PLAN:   Diabetes type 2:   Blood glucose control is overall fairly good with only slight increase in A1c above normal Unable to analyze her home blood sugars as she is checking infrequently and she did not bring the monitor she is actively using at home Her weight loss has leveled off even though she is still reducing her caloric intake significantly and trying to be active  Compared to a couple of years ago she is needing much less insulin and is able to control her  sugars with 30 units of NPH only recently Again discussed the need to check blood sugars consistently after meals especially after supper She may need to increase her metformin if weight loss and blood sugar control is problem especially if her renal function shows GFR over 15  CKD: Creatinine has been about 1.2-1.3  Hypertension: Appears controlled and is followed by PCP and nephrologist      [facility] 11/14/2014, 4:13 PM     No visits with results within 1 Week(s) from this visit. Latest known visit with results is:  Appointment on 10/09/2014  Component Date Value Ref Range Status  . Hgb A1c MFr Bld 10/09/2014 6.7* 4.6 - 6.5 % Final   Glycemic Control Guidelines for People with Diabetes:Non Diabetic:  <6%Goal of Therapy: <7%Additional Action Suggested:  >8%   . Microalb, Ur 10/09/2014 1.3  0.0 - 1.9 mg/dL Final  . Creatinine,U 10/09/2014 75.2   Final  . Microalb Creat Ratio 10/09/2014 1.7  0.0 - 30.0 mg/g Final

## 2014-11-23 ENCOUNTER — Other Ambulatory Visit: Payer: Self-pay | Admitting: Endocrinology

## 2014-11-26 ENCOUNTER — Other Ambulatory Visit: Payer: Self-pay | Admitting: *Deleted

## 2014-11-26 MED ORDER — INSULIN NPH (HUMAN) (ISOPHANE) 100 UNIT/ML ~~LOC~~ SUSP: SUBCUTANEOUS | Status: DC

## 2014-12-18 ENCOUNTER — Encounter: Payer: Self-pay | Admitting: Endocrinology

## 2014-12-23 ENCOUNTER — Other Ambulatory Visit: Payer: Self-pay | Admitting: Cardiology

## 2014-12-23 ENCOUNTER — Other Ambulatory Visit: Payer: Self-pay | Admitting: Endocrinology

## 2014-12-24 ENCOUNTER — Other Ambulatory Visit: Payer: Self-pay

## 2014-12-24 ENCOUNTER — Other Ambulatory Visit: Payer: Self-pay | Admitting: Cardiology

## 2014-12-24 MED ORDER — AMLODIPINE BESYLATE 5 MG PO TABS: 5.0000 mg | ORAL_TABLET | Freq: Every day | ORAL | Status: DC

## 2015-01-06 ENCOUNTER — Telehealth: Payer: Self-pay | Admitting: Endocrinology

## 2015-01-06 NOTE — Telephone Encounter (Signed)
[email or web address]

## 2015-01-06 NOTE — Telephone Encounter (Signed)
Please call [facility] [phone] regarding episode # [id number]

## 2015-01-26 ENCOUNTER — Other Ambulatory Visit: Payer: Self-pay | Admitting: Cardiology

## 2015-02-11 ENCOUNTER — Other Ambulatory Visit: Payer: PPO

## 2015-02-14 ENCOUNTER — Ambulatory Visit: Payer: PPO | Admitting: Endocrinology

## 2015-02-24 ENCOUNTER — Ambulatory Visit: Payer: PPO | Admitting: Endocrinology

## 2015-02-24 ENCOUNTER — Other Ambulatory Visit: Payer: Self-pay | Admitting: Cardiology

## 2015-02-27 ENCOUNTER — Ambulatory Visit (INDEPENDENT_AMBULATORY_CARE_PROVIDER_SITE_OTHER): Payer: PPO | Admitting: Endocrinology

## 2015-02-27 ENCOUNTER — Encounter: Payer: Self-pay | Admitting: Endocrinology

## 2015-02-27 VITALS — BP 130/58 | HR 58 | Temp 98.1°F | Resp 16 | Ht 61.0 in | Wt 226.8 lb

## 2015-02-27 DIAGNOSIS — E1165 Type 2 diabetes mellitus with hyperglycemia: Secondary | ICD-10-CM | POA: Diagnosis not present

## 2015-02-27 DIAGNOSIS — E038 Other specified hypothyroidism: Secondary | ICD-10-CM

## 2015-02-27 DIAGNOSIS — E063 Autoimmune thyroiditis: Secondary | ICD-10-CM

## 2015-02-27 DIAGNOSIS — E782 Mixed hyperlipidemia: Secondary | ICD-10-CM

## 2015-02-27 DIAGNOSIS — E119 Type 2 diabetes mellitus without complications: Secondary | ICD-10-CM

## 2015-02-27 DIAGNOSIS — IMO0002 Reserved for concepts with insufficient information to code with codable children: Secondary | ICD-10-CM

## 2015-02-27 LAB — BASIC METABOLIC PANEL
BUN: 52 mg/dL — ABNORMAL HIGH (ref 6–23)
CALCIUM: 9.4 mg/dL (ref 8.4–10.5)
CHLORIDE: 104 meq/L (ref 96–112)
CO2: 26 meq/L (ref 19–32)
CREATININE: 1.2 mg/dL (ref 0.40–1.20)
GFR: 46.66 mL/min — AB (ref >60.00)
Glucose, Bld: 197 mg/dL — ABNORMAL HIGH (ref 70–99)
Potassium: 4.5 mEq/L (ref 3.5–5.1)
Sodium: 136 mEq/L (ref 135–145)

## 2015-02-27 LAB — HEMOGLOBIN A1C: HEMOGLOBIN A1C: 7.5 % — AB (ref 4.6–6.5)

## 2015-02-27 LAB — LIPID PANEL
CHOL/HDL RATIO: 3
Cholesterol: 132 mg/dL (ref 0–200)
HDL: 37.8 mg/dL — ABNORMAL LOW (ref >39.00)
LDL Cholesterol: 75 mg/dL (ref 0–99)
NonHDL: 94.2
Triglycerides: 96 mg/dL (ref 0.0–149.0)
VLDL: 19.2 mg/dL (ref 0.0–40.0)

## 2015-02-27 LAB — TSH: TSH: 0.89 u[IU]/mL (ref 0.35–4.50)

## 2015-02-27 NOTE — Progress Notes (Signed)
Patient ID: Tina Hester, female   DOB: 1941/07/05, 74 y.o.   MRN: [id number]   Reason for Appointment: Diabetes follow-up   History of Present Illness   Diagnosis: Type 2 DIABETES MELITUS, long-standing    Previous history:  She has been on insulin for a few years. Because of cost she has been switched from Lantus to NPH insulin twice a day She had previously been taking metformin but this was stopped when she had renal insufficiency Overall her blood sugars have been difficult to control and she has previously had relatively poor control. However with adding Actos  her blood sugars had started improving significantly  Recent history:    Insulin regimen: Humulin N 30 hs     Her blood sugars have been well controlled since she had significantly improved her diet in 2015 Her A1c has been consistently upper normal but she has not had an A1c done recently.   Also she has not been able to keep her weight down and this has gone up significantly; some of this she thinks is from starting prednisone in the last few days She is not very active Her weight loss has stopped although she thinks she is still watching her diet fairly well, using a Weight Watchers type diet  Current blood sugar patterns and problems identified:  She has not been checking her sugars and only started checking them a week ago  She had a high reading at night and reading of 93 in the morning but since starting prednisone on Monday she has had blood sugars going up over 200 now either morning or bedtime.  She has not increased her NPH even though her blood sugars are high.  Her diet has been variable      Oral hypoglycemic drugs: Actos, Metformin 500 mg bid       Side effects from medications: None, no edema with 15 mg Actos       Monitors blood glucose: occasionally     Glucometer:  FreeStyle       Blood Glucose readings from download: as above  Hypoglycemia: Has had none Meals: 2-3 meals per day.  Weight  Watchers diet, usually egg and toast      Physical activity: exercise: walking 15-20 min occasionally    Wt Readings from Last 3 Encounters:  02/27/15 226 lb 12.8 oz (102.876 kg)  11/14/14 218 lb (98.884 kg)  07/11/14 215 lb 6.4 oz (97.705 kg)   LABS:  Lab Results  Component Value Date   HGBA1C 6.7* 10/09/2014   HGBA1C 6.4 07/04/2014   HGBA1C 6.4 03/25/2014   Lab Results  Component Value Date   MICROALBUR 1.3 10/09/2014   LDLCALC 83 07/04/2014   CREATININE 1.2 07/04/2014       Medication List       This list is accurate as of: 02/27/15  1:04 PM.  Always use your most recent med list.               acyclovir 400 MG tablet  Commonly known as:  ZOVIRAX     amLODipine 5 MG tablet  Commonly known as:  NORVASC  TAKE 1 TABLET BY MOUTH DAILY     apixaban 5 MG Tabs tablet  Commonly known as:  ELIQUIS  Take 1 tablet (5 mg total) by mouth 2 (two) times daily.     atorvastatin 40 MG tablet  Commonly known as:  LIPITOR  Take 1 tablet (40 mg total) by mouth daily at 6 PM.  atropine 1 % ophthalmic solution  INSTILL 1 DROP INTO THE RIGHT EYE DAILY     bacitracin ophthalmic ointment  APPLY IN RIGHT EYE AT BEDTIME     CALCIUM PO  Take by mouth.     COLCRYS 0.6 MG tablet  Generic drug:  colchicine  as needed.     fenofibrate 145 MG tablet  Commonly known as:  TRICOR  Take 145 mg by mouth. TAKING 3 TIMES A WEEK     glucose blood test strip  Commonly known as:  FREESTYLE LITE  Use to test blood sugar twice daily. Dx: E11.9     insulin NPH Human 100 UNIT/ML injection  Commonly known as:  NOVOLIN N  Inject 20 units every morning and 45 units every night     INSULIN SYRINGE .5CC/30GX5/16" 30G X 5/16" 0.5 ML Misc  Use 2 needles per day to inject insulin     levothyroxine 137 MCG tablet  Commonly known as:  SYNTHROID, LEVOTHROID  Take 137 mcg by mouth daily before breakfast.     lisinopril-hydrochlorothiazide 20-25 MG per tablet  Commonly known as:   PRINZIDE,ZESTORETIC  Take 1 tablet by mouth daily.     metFORMIN 500 MG tablet  Commonly known as:  GLUCOPHAGE  TAKE 2 TABLETS BY MOUTH EVERY EVENING     metoprolol succinate 25 MG 24 hr tablet  Commonly known as:  TOPROL-XL  TAKE 1 TABLET BY MOUTH TWICE DAILY     multivitamin capsule  Take 1 capsule by mouth daily.     NEEDLE (DISP) 30 G 30G X 1/2" Misc  Commonly known as:  BD DISP NEEDLES  1 each by Does not apply route 2 (two) times daily before a meal.     ONETOUCH DELICA LANCETS 40J Misc  Use to check blood sugars once daily     pioglitazone 15 MG tablet  Commonly known as:  ACTOS  TAKE 1 TABLET BY MOUTH EVERY DAY     prednisoLONE acetate 1 % ophthalmic suspension  Commonly known as:  PRED FORTE  Place 4 drops into the right eye.     predniSONE 10 MG tablet  Commonly known as:  DELTASONE  TK 1 T PO FIVE TIMES DAILY     ULORIC 40 MG tablet  Generic drug:  febuxostat  40 mg.     valACYclovir 500 MG tablet  Commonly known as:  VALTREX  Take 500 mg by mouth 3 (three) times daily.     vitamin B-12 1000 MCG tablet  Commonly known as:  CYANOCOBALAMIN  Take 1,000 mcg by mouth daily.        Allergies:  Allergies  Allergen Reactions  . Pneumovax 23 [Pneumococcal Vac Polyvalent] Anaphylaxis    weakness  . Sulfa Antibiotics Hives    Past Medical History  Diagnosis Date  . Diabetes mellitus without complication     type 2  . GERD (gastroesophageal reflux disease)   . Hyperlipidemia   . Palpitations   . OSA on CPAP   . Anxiety   . TIA (transient ischemic attack)     remote history of TIA's early 2000  . Anemia     s/p Heme work -up normal EGD and colonscopy in 2012 per pt,neg SPEP  . HSV-1 (herpes simplex virus 1) infection     Acyclovir prn  . Joint pain     osteoarthritis by Xray- possible erosion of R 4th MCP,elevated uric  acid ,ANA +   . Morbid obesity   .  Gout   . Osteopenia   . Pain management     Neurosurg: Dr Clydell Hakim  . CKD (chronic  kidney disease), stage III   . Aortic valve disorders   . Hypertension   . PAF (paroxysmal atrial fibrillation)     No past surgical history on file.  Family History  Problem Relation Age of Onset  . Arrhythmia Brother     Social History:  reports that she has never smoked. She has never used smokeless tobacco. She reports that she does not drink alcohol or use illicit drugs.  Review of Systems:  Low B12 was diagnosed by PCP and she is taking 1000 g oral supplement  Hypertension:  blood pressure is fairly well controlled She is currently taking Zestoretic, low dose metoprolol and amlodipine 5 mg.   Lipids: Triglycerides have been high, now controlled on fenofibrate, LDL has been below 100 with Lipitor 40 mg which she is tolerating well  Lab Results  Component Value Date   CHOL 133 07/04/2014   HDL 22.70* 07/04/2014   LDLCALC 83 07/04/2014   LDLDIRECT 97.2 05/31/2013   TRIG 135.0 07/04/2014   CHOLHDL 6 07/04/2014     Renal insufficiency: Her creatinine has been high normal previously but appears fairly good consistently;  she is followed by nephrologist  Creatinine from PCP was 1.3 in January 2016    Lab Results  Component Value Date   CREATININE 1.2 07/04/2014   She has hypothyroidism and is taking 137 g, has a TSH of 1.06 in 1/16 from PCP   Lab Results  Component Value Date   TSH 1.28 06/19/2013   She is taking prednisone possibly for uveitis from the ophthalmologist and now taking 50 mg until Monday and then 40 mg   Examination:   BP 130/58 mmHg  Pulse 58  Temp(Src) 98.1 F (36.7 C)  Resp 16  Ht 5\' 1"  (1.549 m)  Wt 226 lb 12.8 oz (102.876 kg)  BMI 42.88 kg/m2  SpO2 98%  Body mass index is 42.88 kg/(m^2).    No edema Diabetic foot exam shows normal monofilament sensation in the toes and plantar surfaces, no skin lesions or ulcers on the feet and normal pedal pulses   ASSESSMENT/ PLAN:   Diabetes type 2:   Blood glucose control is difficult  to assess as she has not had an A1c and except the last few days has not checked her sugar at home Her blood sugars are markedly increased with taking prednisone from the ophthalmologist See history of present illness for detailed discussion of his current management, blood sugar patterns and problems identified Discussed need to take blood sugars 3 times a day at least while taking prednisone and regularly thereafter She will increase her insulin as directed and taper off the doses again once blood sugars are better with getting off prednisone. She will need to have labs rechecked   Encouraged her to be more active when she can She needs to get back on her diet as before  Foot exam shows no neuropathy  CKD: Creatinine needs to be rechecked  Hypertension: Appears controlled and is followed by PCP and nephrologist   Counseling time on subjects discussed above is over 50% of today's 25 minute visit  Patient Instructions  Check blood sugars on waking up ..5 .. times a week Also check blood sugars about 2 hours after a meal and do this after different meals by rotation Recommended blood sugar levels on waking up is 90-130 and  about 2 hours after meal is 140-180 Please bring blood sugar monitor to each visit.  Insulin 20 units and and 40 at nite, may need to go up 5-10 more units and taper down when sugars <120-140 range      Mikaiah Stoffer 02/27/2015, 1:04 PM    Addendum: A1c has increased, Creatinine stable at 1.2  Office Visit on 02/27/2015  Component Date Value Ref Range Status  . Hgb A1c MFr Bld 02/27/2015 7.5* 4.6 - 6.5 % Final   Glycemic Control Guidelines for People with Diabetes:Non Diabetic:  <6%Goal of Therapy: <7%Additional Action Suggested:  >8%   . Sodium 02/27/2015 136  135 - 145 mEq/L Final  . Potassium 02/27/2015 4.5  3.5 - 5.1 mEq/L Final  . Chloride 02/27/2015 104  96 - 112 mEq/L Final  . CO2 02/27/2015 26  19 - 32 mEq/L Final  . Glucose, Bld 02/27/2015 197* 70 - 99  mg/dL Final  . BUN 02/27/2015 52* 6 - 23 mg/dL Final  . Creatinine, Ser 02/27/2015 1.20  0.40 - 1.20 mg/dL Final  . Calcium 02/27/2015 9.4  8.4 - 10.5 mg/dL Final  . GFR 02/27/2015 46.66* >60.00 mL/min Final  . Cholesterol 02/27/2015 132  0 - 200 mg/dL Final   ATP III Classification       Desirable:  < 200 mg/dL               Borderline High:  200 - 239 mg/dL          High:  > = 240 mg/dL  . Triglycerides 02/27/2015 96.0  0.0 - 149.0 mg/dL Final   Normal:  <150 mg/dLBorderline High:  150 - 199 mg/dL  . HDL 02/27/2015 37.80* >39.00 mg/dL Final  . VLDL 02/27/2015 19.2  0.0 - 40.0 mg/dL Final  . LDL Cholesterol 02/27/2015 75  0 - 99 mg/dL Final  . Total CHOL/HDL Ratio 02/27/2015 3   Final                  Men          Women1/2 Average Risk     3.4          3.3Average Risk          5.0          4.42X Average Risk          9.6          7.13X Average Risk          15.0          11.0                      . NonHDL 02/27/2015 94.20   Final   NOTE:  Non-HDL goal should be 30 mg/dL higher than patient's LDL goal (i.e. LDL goal of < 70 mg/dL, would have non-HDL goal of < 100 mg/dL)  . TSH 02/27/2015 0.89  0.35 - 4.50 uIU/mL Final

## 2015-02-27 NOTE — Patient Instructions (Addendum)
Check blood sugars on waking up ..5 .. times a week Also check blood sugars about 2 hours after a meal and do this after different meals by rotation Recommended blood sugar levels on waking up is 90-130 and about 2 hours after meal is 140-180 Please bring blood sugar monitor to each visit.  Insulin 20 units and and 40 at nite, may need to go up 5-10 more units and taper down when sugars <120-140 range

## 2015-02-28 ENCOUNTER — Other Ambulatory Visit: Payer: PPO

## 2015-03-05 ENCOUNTER — Ambulatory Visit: Payer: PPO | Admitting: Endocrinology

## 2015-03-05 NOTE — Progress Notes (Signed)
Quick Note:  Please let patient know that the A1c diabetes test is higher at 7.5, cholesterol and thyroid okay. Fax to PCP  ______

## 2015-03-24 ENCOUNTER — Other Ambulatory Visit: Payer: Self-pay | Admitting: Cardiology

## 2015-03-24 ENCOUNTER — Other Ambulatory Visit: Payer: Self-pay

## 2015-03-24 MED ORDER — METOPROLOL SUCCINATE ER 25 MG PO TB24: 25.0000 mg | ORAL_TABLET | Freq: Two times a day (BID) | ORAL | Status: DC

## 2015-03-31 ENCOUNTER — Ambulatory Visit: Payer: PPO | Admitting: Endocrinology

## 2015-04-09 LAB — CBC AND DIFFERENTIAL
HEMATOCRIT: 35 % — AB (ref 36–46)
HEMOGLOBIN: 12.2 g/dL (ref 12.0–16.0)
NEUTROS ABS: 4 /uL
Platelets: 212 10*3/uL (ref 150–399)
WBC: 7.4 10^3/mL

## 2015-04-09 LAB — BASIC METABOLIC PANEL WITH GFR
BUN: 22 mg/dL — AB (ref 4–21)
Creatinine: 1 mg/dL (ref 0.5–1.1)
Glucose: 123 mg/dL
Potassium: 5 mmol/L (ref 3.4–5.3)
Sodium: 139 mmol/L (ref 137–147)

## 2015-04-21 ENCOUNTER — Ambulatory Visit (INDEPENDENT_AMBULATORY_CARE_PROVIDER_SITE_OTHER): Payer: PPO | Admitting: Endocrinology

## 2015-04-21 ENCOUNTER — Encounter: Payer: Self-pay | Admitting: Endocrinology

## 2015-04-21 VITALS — BP 134/60 | HR 73 | Temp 98.0°F | Ht 61.0 in | Wt 230.0 lb

## 2015-04-21 DIAGNOSIS — E063 Autoimmune thyroiditis: Secondary | ICD-10-CM

## 2015-04-21 DIAGNOSIS — E038 Other specified hypothyroidism: Secondary | ICD-10-CM

## 2015-04-21 DIAGNOSIS — E1165 Type 2 diabetes mellitus with hyperglycemia: Secondary | ICD-10-CM

## 2015-04-21 DIAGNOSIS — IMO0002 Reserved for concepts with insufficient information to code with codable children: Secondary | ICD-10-CM

## 2015-04-21 MED ORDER — INSULIN REGULAR HUMAN 100 UNIT/ML IJ SOLN: 6.0000 [IU] | Freq: Three times a day (TID) | INTRAMUSCULAR | Status: DC

## 2015-04-21 NOTE — Progress Notes (Signed)
Pre visit review using our clinic review tool, if applicable. No additional management support is needed unless otherwise documented below in the visit note. 

## 2015-04-21 NOTE — Progress Notes (Signed)
Patient ID: Tina Hester, female   DOB: [birth year], 74 y.o.   MRN: [id number]   Reason for Appointment: Diabetes follow-up   History of Present Illness   Diagnosis: Type 2 DIABETES MELITUS, long-standing    Previous history:  She has been on insulin for a few years. Because of cost she has been switched from Lantus to NPH insulin twice a day She had previously been taking metformin but this was stopped when she had renal insufficiency Overall her blood sugars have been difficult to control and she has previously had relatively poor control. However with adding Actos her blood sugars had started improving significantly Also with her being very consistent with diet in 2015 her A1c had gone down to 6.4  Recent history:    Insulin regimen: Humulin N 35-40  hs     Her blood sugars have been variably controlled in 2016 More recently because of steroids blood sugars had gone up significantly  She is off prednisone for about 2 weeks and her blood sugars are improving She is not very active  Current blood sugar patterns and problems identified:  She has  needed higher doses of insulin when she was on prednisone and still is taking about 35 units, previously on 30  She has done a few readings after breakfast and lunch but only sporadically  She has 3 readings at bedtime recently and these are high  She sometimes takes 40 units of insulin at night instead of 35 and not clear why  Morning sugars are reasonably good with some fluctuation.   Her diet has been variable      Oral hypoglycemic drugs: Actos, Metformin 500 mg bid       Side effects from medications: None, no edema with 15 mg Actos       Monitors blood glucose: occasionally     Glucometer:  FreeStyle       Blood Glucose readings from download:   Mean values apply above for all meters except median for One Touch  PRE-MEAL Fasting Lunch Dinner Bedtime Overall  Glucose range:  64-150   124    173-219      Mean/median:  125      151    POST-MEAL PC Breakfast PC Lunch PC Dinner  Glucose range:  159, 222   175    Mean/median:       Hypoglycemia: Has had only one low normal blood sugar fasting.  However did get up on Saturday night around 1-2 AM feeling shaky with blood sugar 113 Meals: 2-3 meals per day. Dinner 7-8 [employer] diet, usually egg and toast      Physical activity: exercise: walking 15-20 min occasionally    Wt Readings from Last 3 Encounters:  04/21/15 230 lb (104.327 kg)  02/27/15 226 lb 12.8 oz (102.876 kg)  11/14/14 218 lb (98.884 kg)   LABS:  Lab Results  Component Value Date   HGBA1C 7.5* 02/27/2015   HGBA1C 6.7* 10/09/2014   HGBA1C 6.4 07/04/2014   Lab Results  Component Value Date   MICROALBUR 1.3 10/09/2014   LDLCALC 75 02/27/2015   CREATININE 1.0 04/09/2015       Medication List       This list is accurate as of: 04/21/15  2:16 PM.  Always use your most recent med list.               ALPRAZolam 0.25 MG tablet  Commonly known as:  XANAX  Take 0.25  mg by mouth at bedtime as needed for anxiety.     amLODipine 5 MG tablet  Commonly known as:  NORVASC  TAKE 1 TABLET BY MOUTH DAILY     apixaban 5 MG Tabs tablet  Commonly known as:  ELIQUIS  Take 1 tablet (5 mg total) by mouth 2 (two) times daily.     atorvastatin 40 MG tablet  Commonly known as:  LIPITOR  Take 1 tablet (40 mg total) by mouth daily at 6 PM.     atropine 1 % ophthalmic solution  INSTILL 1 DROP INTO THE RIGHT EYE DAILY     bacitracin ophthalmic ointment  APPLY IN RIGHT EYE AT BEDTIME     CALCIUM PO  Take by mouth.     COLCRYS 0.6 MG tablet  Generic drug:  colchicine  as needed.     fenofibrate 145 MG tablet  Commonly known as:  TRICOR  Take 145 mg by mouth. TAKING 3 TIMES A WEEK     glucose blood test strip  Commonly known as:  FREESTYLE LITE  Use to test blood sugar twice daily. Dx: E11.9     insulin NPH Human 100 UNIT/ML injection  Commonly known as:   NOVOLIN N  Inject 20 units every morning and 45 units every night     INSULIN SYRINGE .5CC/30GX5/16" 30G X 5/16" 0.5 ML Misc  Use 2 needles per day to inject insulin     levothyroxine 137 MCG tablet  Commonly known as:  SYNTHROID, LEVOTHROID  Take 137 mcg by mouth daily before breakfast.     lisinopril-hydrochlorothiazide 20-25 MG per tablet  Commonly known as:  PRINZIDE,ZESTORETIC  Take 1 tablet by mouth daily.     metFORMIN 500 MG tablet  Commonly known as:  GLUCOPHAGE  TAKE 2 TABLETS BY MOUTH EVERY EVENING     metoprolol succinate 25 MG 24 hr tablet  Commonly known as:  TOPROL-XL  TAKE 1 TABLET BY MOUTH TWICE DAILY     multivitamin capsule  Take 1 capsule by mouth daily.     NEEDLE (DISP) 30 G 30G X 1/2" Misc  Commonly known as:  BD DISP NEEDLES  1 each by Does not apply route 2 (two) times daily before a meal.     ONETOUCH DELICA LANCETS 16X Misc  Use to check blood sugars once daily     pioglitazone 15 MG tablet  Commonly known as:  ACTOS  TAKE 1 TABLET BY MOUTH EVERY DAY     prednisoLONE acetate 1 % ophthalmic suspension  Commonly known as:  PRED FORTE  Place 4 drops into the right eye.     predniSONE 10 MG tablet  Commonly known as:  DELTASONE  TK 1 T PO FIVE TIMES DAILY     ULORIC 40 MG tablet  Generic drug:  febuxostat  40 mg.     valACYclovir 500 MG tablet  Commonly known as:  VALTREX  Take 500 mg by mouth 3 (three) times daily.     vitamin B-12 1000 MCG tablet  Commonly known as:  CYANOCOBALAMIN  Take 1,000 mcg by mouth daily.        Allergies:  Allergies  Allergen Reactions  . Pneumovax 23 [Pneumococcal Vac Polyvalent] Anaphylaxis    weakness  . Sulfa Antibiotics Hives    Past Medical History  Diagnosis Date  . Diabetes mellitus without complication     type 2  . GERD (gastroesophageal reflux disease)   . Hyperlipidemia   . Palpitations   .  OSA on CPAP   . Anxiety   . TIA (transient ischemic attack)     remote history of TIA's  early 2000  . Anemia     s/p Heme work -up normal EGD and colonscopy in 2012 per pt,neg SPEP  . HSV-1 (herpes simplex virus 1) infection     Acyclovir prn  . Joint pain     osteoarthritis by Xray- possible erosion of R 4th MCP,elevated uric  acid ,ANA +   . Morbid obesity   . Gout   . Osteopenia   . Pain management     Neurosurg: Dr Clydell Hakim  . CKD (chronic kidney disease), stage III   . Aortic valve disorders   . Hypertension   . PAF (paroxysmal atrial fibrillation)     No past surgical history on file.  Family History  Problem Relation Age of Onset  . Arrhythmia Brother     Social History:  reports that she has never smoked. She has never used smokeless tobacco. She reports that she does not drink alcohol or use illicit drugs.  Review of Systems:  Low B12 was diagnosed by PCP and she is taking 1000 g oral supplement  Hypertension:  blood pressure is fairly well controlled She is currently taking Zestoretic, low dose metoprolol and amlodipine 5 mg.   Lipids: Triglycerides have been high, now controlled on fenofibrate, LDL has been below 100 with Lipitor 40 mg which she is tolerating well  Lab Results  Component Value Date   CHOL 132 02/27/2015   HDL 37.80* 02/27/2015   LDLCALC 75 02/27/2015   LDLDIRECT 97.2 05/31/2013   TRIG 96.0 02/27/2015   CHOLHDL 3 02/27/2015    Renal insufficiency: Her creatinine has been high normal previously but appears fairly good consistently;  she is followed by nephrologist annually now    Lab Results  Component Value Date   CREATININE 1.0 04/09/2015   She has hypothyroidism and is taking 137 g,   Lab Results  Component Value Date   TSH 0.89 02/27/2015     Diabetic foot exam  as of 6/16 shows normal monofilament sensation in the toes and plantar surfaces, no skin lesions or ulcers on the feet and normal pedal pulses    Examination:   BP 134/60 mmHg  Pulse 73  Temp(Src) 98 F (36.7 C) (Oral)  Ht 5\' 1"  (1.549 m)   Wt 230 lb (104.327 kg)  BMI 43.48 kg/m2  SpO2 98%  Body mass index is 43.48 kg/(m^2).    No edema   ASSESSMENT/ PLAN:   Diabetes type 2 with obesity :   Blood glucose control is  improving with her getting off steroids now See history of present illness for detailed discussion of his current management, blood sugar patterns and problems identified  Currently appears to be having postprandial hyperglycemia especially at suppertime which is consistent  Recommendations made today:  More consistent monitoring of blood sugars 2 hours after meals including after breakfast and lunch, does not need to wait till bedtime to check evening readings  Start 6 units of regular insulin, she is currently using a syringe and can continue.  Discussed cost of [employer] brand insulin  Discussed titration of the mealtime dose to keep was prandial readings in the range below  Do not adjust NPH arbitrarily and adjust once a week or so based on fasting blood sugar trend  May need Regular Insulin for other meals also  Increase activity as tolerated  Continue metformin and Actos  unchanged  CKD: Creatinine  normal now   Hypertension: Appears controlled and is followed by PCP and nephrologist   Counseling time on subjects discussed above is over 50% of today's 25 minute visit  Patient Instructions  Check blood sugars on waking up .. 3 .. times a week Also check blood sugars about 2 hours after a meal and do this after different meals by rotation  Recommended blood sugar levels on waking up is 90-130 and about 2 hours after meal is 140-180 Please bring blood sugar monitor to each visit.  Regular 6 units before supper to keep sugar in above range and may need at all meals      Tina Hester 04/21/2015, 2:16 PM      Office Visit on 04/21/2015  Component Date Value Ref Range Status  . Hemoglobin 04/09/2015 12.2  12.0 - 16.0 g/dL Final  . HCT 04/09/2015 35* 36 - 46 % Final  . Neutrophils  Absolute 04/09/2015 4   Final  . Platelets 04/09/2015 212  150 - 399 K/L Final  . WBC 04/09/2015 7.4   Final  . Glucose 04/09/2015 123   Final  . BUN 04/09/2015 22* 4 - 21 mg/dL Final  . Creatinine 04/09/2015 1.0  0.5 - 1.1 mg/dL Final  . Potassium 04/09/2015 5.0  3.4 - 5.3 mmol/L Final  . Sodium 04/09/2015 139  137 - 147 mmol/L Final

## 2015-04-21 NOTE — Patient Instructions (Addendum)
Check blood sugars on waking up .. 3 .. times a week Also check blood sugars about 2 hours after a meal and do this after different meals by rotation  Recommended blood sugar levels on waking up is 90-130 and about 2 hours after meal is 140-180 Please bring blood sugar monitor to each visit.  Regular 6 units before supper to keep sugar in above range and may need at all meals

## 2015-05-15 ENCOUNTER — Other Ambulatory Visit: Payer: Self-pay | Admitting: Internal Medicine

## 2015-05-15 ENCOUNTER — Other Ambulatory Visit: Payer: Self-pay | Admitting: Cardiology

## 2015-05-26 ENCOUNTER — Ambulatory Visit (INDEPENDENT_AMBULATORY_CARE_PROVIDER_SITE_OTHER): Payer: PPO | Admitting: Cardiology

## 2015-05-26 ENCOUNTER — Encounter: Payer: Self-pay | Admitting: Cardiology

## 2015-05-26 VITALS — BP 150/58 | HR 60 | Ht 61.0 in | Wt 233.6 lb

## 2015-05-26 DIAGNOSIS — G4733 Obstructive sleep apnea (adult) (pediatric): Secondary | ICD-10-CM

## 2015-05-26 DIAGNOSIS — I359 Nonrheumatic aortic valve disorder, unspecified: Secondary | ICD-10-CM | POA: Diagnosis not present

## 2015-05-26 DIAGNOSIS — I10 Essential (primary) hypertension: Secondary | ICD-10-CM | POA: Diagnosis not present

## 2015-05-26 DIAGNOSIS — E669 Obesity, unspecified: Secondary | ICD-10-CM

## 2015-05-26 DIAGNOSIS — I480 Paroxysmal atrial fibrillation: Secondary | ICD-10-CM | POA: Diagnosis not present

## 2015-05-26 DIAGNOSIS — Z9989 Dependence on other enabling machines and devices: Secondary | ICD-10-CM

## 2015-05-26 DIAGNOSIS — R0989 Other specified symptoms and signs involving the circulatory and respiratory systems: Secondary | ICD-10-CM

## 2015-05-26 NOTE — Progress Notes (Signed)
Cardiology Office Note   Date:  05/26/2015   ID:  Tina Hester, DOB 1940/12/01, MRN [id number]  PCP:  Lilian Coma, MD    Chief Complaint  Patient presents with  . PAF      History of Present Illness: Tina Hester is a 74 y.o. female with a history of PAF, HTN and OSA on CPAP presents today for followup. She is doing well. She denies any chest pain, SOB, DOE, LE edema, dizziness, palpitations or syncope. She tolerates her CPAP well. She feels the pressure is adequate and has no problems with the full face mask. She feels rested in the am and has no daytime sleepiness.      Past Medical History  Diagnosis Date  . Diabetes mellitus without complication     type 2  . GERD (gastroesophageal reflux disease)   . Hyperlipidemia   . Palpitations   . OSA on CPAP   . Anxiety   . TIA (transient ischemic attack)     remote history of TIA's early 2000  . Anemia     s/p Heme work -up normal EGD and colonscopy in 2012 per pt,neg SPEP  . HSV-1 (herpes simplex virus 1) infection     Acyclovir prn  . Joint pain     osteoarthritis by Xray- possible erosion of R 4th MCP,elevated uric  acid ,ANA +   . Morbid obesity   . Gout   . Osteopenia   . Pain management     Neurosurg: Dr Clydell Hakim  . CKD (chronic kidney disease), stage III   . Aortic valve disorders   . Hypertension   . PAF (paroxysmal atrial fibrillation)     History reviewed. No pertinent past surgical history.   Current Outpatient Prescriptions  Medication Sig Dispense Refill  . ALPRAZolam (XANAX) 0.25 MG tablet Take 0.25 mg by mouth at bedtime as needed for anxiety.    Marland Kitchen amLODipine (NORVASC) 5 MG tablet TAKE 1 TABLET BY MOUTH EVERY DAY 30 tablet 0  . apixaban (ELIQUIS) 5 MG TABS tablet Take 1 tablet (5 mg total) by mouth 2 (two) times daily. 90 tablet 3  . atorvastatin (LIPITOR) 40 MG tablet Take 1 tablet (40 mg total) by mouth daily at 6 PM. 90 tablet 3  . atropine 1 %  ophthalmic solution INSTILL 1 DROP INTO THE RIGHT EYE DAILY  0  . bacitracin ophthalmic ointment APPLY IN RIGHT EYE AT BEDTIME  1  . CALCIUM PO Take by mouth.    . COLCRYS 0.6 MG tablet as needed.     . fenofibrate (TRICOR) 145 MG tablet Take 145 mg by mouth. TAKING 3 TIMES A WEEK    . glucose blood (FREESTYLE LITE) test strip Use to test blood sugar twice daily. Dx: E11.9 200 each 1  . insulin NPH Human (NOVOLIN N) 100 UNIT/ML injection Inject 20 units every morning and 45 units every night (Patient taking differently: 35 units every night) 20 mL 3  . insulin regular (NOVOLIN R RELION) 100 units/mL injection Inject 0.06-0.1 mLs (6-10 Units total) into the skin 3 (three) times daily before meals. 10 mL 1  . Insulin Syringe-Needle U-100 (INSULIN SYRINGE .5CC/30GX5/16") 30G X 5/16" 0.5 ML MISC Use 2 needles per day to inject insulin 60 each 5  . levothyroxine (SYNTHROID, LEVOTHROID) 137 MCG tablet Take 137 mcg by mouth daily before breakfast.     . lisinopril-hydrochlorothiazide (  PRINZIDE,ZESTORETIC) 20-25 MG per tablet Take 1 tablet by mouth daily. 90 tablet 3  . metFORMIN (GLUCOPHAGE) 500 MG tablet TAKE 2 TABLETS BY MOUTH EVERY EVENING 180 tablet 1  . metoprolol succinate (TOPROL-XL) 25 MG 24 hr tablet TAKE 1 TABLET BY MOUTH TWICE DAILY 180 tablet 0  . Multiple Vitamin (MULTIVITAMIN) capsule Take 1 capsule by mouth daily.    Marland Kitchen NEEDLE, DISP, 30 G (BD DISP NEEDLES) 30G X 1/2" MISC 1 each by Does not apply route 2 (two) times daily before a meal. 100 each 5  . ONETOUCH DELICA LANCETS 47W MISC Use to check blood sugars once daily 50 each 3  . pioglitazone (ACTOS) 15 MG tablet TAKE 1 TABLET BY MOUTH EVERY DAY 90 tablet 1  . prednisoLONE acetate (PRED FORTE) 1 % ophthalmic suspension Place 4 drops into the right eye.     . predniSONE (DELTASONE) 10 MG tablet TK 1 T PO FIVE TIMES DAILY  1  . ULORIC 40 MG tablet 40 mg.    . valACYclovir (VALTREX) 500 MG tablet Take 500 mg by mouth 3 (three) times daily.     . vitamin B-12 (CYANOCOBALAMIN) 1000 MCG tablet Take 1,000 mcg by mouth daily.     No current facility-administered medications for this visit.    Allergies:   Pneumovax 23 and Sulfa antibiotics    Social History:  The patient  reports that she has never smoked. She has never used smokeless tobacco. She reports that she does not drink alcohol or use illicit drugs.   Family History:  The patient's family history includes Arrhythmia in her brother.    ROS:  Please see the history of present illness.   Otherwise, review of systems are positive for none.   All other systems are reviewed and negative.    PHYSICAL EXAM: VS:  BP 150/58 mmHg  Pulse 60  Ht 5\' 1"  (1.549 m)  Wt 233 lb 9.6 oz (105.96 kg)  BMI 44.16 kg/m2 , BMI Body mass index is 44.16 kg/(m^2). GEN: Well nourished, well developed, in no acute distress HEENT: normal Neck: no JVD or masses.  Left carotid artery bruit Cardiac: RRR; no murmurs, rubs, or gallops,no edema.  2/6 SM at RUSB to LLSB Respiratory:  clear to auscultation bilaterally, normal work of breathing GI: soft, nontender, nondistended, + BS MS: no deformity or atrophy Skin: warm and dry, no rash Neuro:  Strength and sensation are intact Psych: euthymic mood, full affect   EKG:  EKG was ordered today and showed NSR at 60bpm with LAFB and nonspecific ST abnormality    Recent Labs: 07/04/2014: ALT 17 02/27/2015: TSH 0.89 04/09/2015: BUN 22*; Creatinine 1.0; Hemoglobin 12.2; Platelets 212; Potassium 5.0; Sodium 139    Lipid Panel    Component Value Date/Time   CHOL 132 02/27/2015 0942   TRIG 96.0 02/27/2015 0942   HDL 37.80* 02/27/2015 0942   CHOLHDL 3 02/27/2015 0942   VLDL 19.2 02/27/2015 0942   LDLCALC 75 02/27/2015 0942   LDLDIRECT 97.2 05/31/2013 0942      Wt Readings from Last 3 Encounters:  05/26/15 233 lb 9.6 oz (105.96 kg)  04/21/15 230 lb (104.327 kg)  02/27/15 226 lb 12.8 oz (102.876 kg)    ASSESSMENT AND PLAN:  1. PAF  maintaining sinus bradycardia - continue Eliquis/metoprolol - check BMET from renal MD office 2. HTN - borderline controlled - continue Prinizide/Metoprolol/amlodipine - I have asked her to check her BP daily for a week and call with the results.  3. OSA on CPAP and tolerating well - I will get a d/l from her DME and order new supplies 4. Mild AS - recheck 2D echo  5. Left carotid artery bruit - check carotid arterial dopplers    Current medicines are reviewed at length with the patient today.  The patient does not have concerns regarding medicines.  The following changes have been made:  no change  Labs/ tests ordered today: See above Assessment and Plan  Orders Placed This Encounter  Procedures  . For home use only DME continuous positive airway pressure (CPAP)  . EKG 12-Lead  . ECHOCARDIOGRAM COMPLETE     Disposition:   FU with me in 6 months  Signed, Sueanne Margarita, MD  05/26/2015 4:22 PM    [facility] [address], [place], [place]  [address] Phone: [phone]; Fax: [phone]

## 2015-05-26 NOTE — Patient Instructions (Addendum)
Medication Instructions:  Your physician recommends that you continue on your current medications as directed. Please refer to the Current Medication list given to you today.   Labwork: None  Testing/Procedures: Your physician has requested that you have an echocardiogram. Echocardiography is a painless test that uses sound waves to create images of your heart. It provides your doctor with information about the size and shape of your heart and how well your heart's chambers and valves are working. This procedure takes approximately one hour. There are no restrictions for this procedure.   Your physician has requested that you have a carotid duplex. This test is an ultrasound of the carotid arteries in your neck. It looks at blood flow through these arteries that supply the brain with blood. Allow one hour for this exam. There are no restrictions or special instructions.  Follow-Up: Your physician wants you to follow-up in: 6 months with Dr. Radford Pax. You will receive a reminder letter in the mail two months in advance. If you don't receive a letter, please call our office to schedule the follow-up appointment.   Any Other Special Instructions Will Be Listed Below (If Applicable).

## 2015-05-29 ENCOUNTER — Telehealth: Payer: Self-pay | Admitting: *Deleted

## 2015-05-29 DIAGNOSIS — Z9989 Dependence on other enabling machines and devices: Principal | ICD-10-CM

## 2015-05-29 DIAGNOSIS — G4733 Obstructive sleep apnea (adult) (pediatric): Secondary | ICD-10-CM

## 2015-05-29 NOTE — Telephone Encounter (Signed)
Patient left a voicemail on the refill line stating that [facility] needs a [facility] rx for her cpap supplies. Please advise. Thanks, MI

## 2015-05-29 NOTE — Telephone Encounter (Signed)
New supplies ordered and message sent to [facility].

## 2015-05-29 NOTE — Telephone Encounter (Signed)
Please order new CPAP supplies

## 2015-06-05 ENCOUNTER — Telehealth: Payer: Self-pay

## 2015-06-05 ENCOUNTER — Ambulatory Visit (HOSPITAL_BASED_OUTPATIENT_CLINIC_OR_DEPARTMENT_OTHER): Payer: PPO

## 2015-06-05 ENCOUNTER — Other Ambulatory Visit: Payer: Self-pay

## 2015-06-05 ENCOUNTER — Ambulatory Visit (HOSPITAL_COMMUNITY)
Admission: RE | Admit: 2015-06-05 | Discharge: 2015-06-05 | Disposition: A | Discharge status: Home / Self Care | Payer: PPO | Source: Ambulatory Visit | Attending: Cardiology | Admitting: Cardiology

## 2015-06-05 DIAGNOSIS — I340 Nonrheumatic mitral (valve) insufficiency: Secondary | ICD-10-CM | POA: Diagnosis not present

## 2015-06-05 DIAGNOSIS — N189 Chronic kidney disease, unspecified: Secondary | ICD-10-CM | POA: Diagnosis not present

## 2015-06-05 DIAGNOSIS — I6523 Occlusion and stenosis of bilateral carotid arteries: Secondary | ICD-10-CM | POA: Diagnosis not present

## 2015-06-05 DIAGNOSIS — I359 Nonrheumatic aortic valve disorder, unspecified: Secondary | ICD-10-CM

## 2015-06-05 DIAGNOSIS — I350 Nonrheumatic aortic (valve) stenosis: Secondary | ICD-10-CM | POA: Diagnosis not present

## 2015-06-05 DIAGNOSIS — I371 Nonrheumatic pulmonary valve insufficiency: Secondary | ICD-10-CM | POA: Diagnosis not present

## 2015-06-05 DIAGNOSIS — I129 Hypertensive chronic kidney disease with stage 1 through stage 4 chronic kidney disease, or unspecified chronic kidney disease: Secondary | ICD-10-CM | POA: Insufficient documentation

## 2015-06-05 DIAGNOSIS — I358 Other nonrheumatic aortic valve disorders: Secondary | ICD-10-CM | POA: Diagnosis not present

## 2015-06-05 DIAGNOSIS — I517 Cardiomegaly: Secondary | ICD-10-CM | POA: Diagnosis not present

## 2015-06-05 DIAGNOSIS — R0989 Other specified symptoms and signs involving the circulatory and respiratory systems: Secondary | ICD-10-CM | POA: Diagnosis not present

## 2015-06-05 NOTE — Telephone Encounter (Signed)
Informed patient of results and verbal understanding expressed.  Repeat ECHO ordered to be scheduled in 1 year. Patient agrees with treatment plan. 

## 2015-06-05 NOTE — Telephone Encounter (Signed)
-----   Message from Sueanne Margarita, MD sent at 06/05/2015  9:34 AM EDT ----- Echo showed normal LVF with mild LVH, mildly calcified AV leaflets with mild to moderate AS, moderate pulmonary HTN - repeat echo in 1 year

## 2015-06-08 ENCOUNTER — Encounter: Payer: Self-pay | Admitting: Cardiology

## 2015-06-08 DIAGNOSIS — I6523 Occlusion and stenosis of bilateral carotid arteries: Secondary | ICD-10-CM | POA: Insufficient documentation

## 2015-06-08 HISTORY — DX: Occlusion and stenosis of bilateral carotid arteries: I65.23

## 2015-06-10 ENCOUNTER — Other Ambulatory Visit: Payer: Self-pay | Admitting: Endocrinology

## 2015-06-12 ENCOUNTER — Telehealth: Payer: Self-pay

## 2015-06-12 DIAGNOSIS — I6523 Occlusion and stenosis of bilateral carotid arteries: Secondary | ICD-10-CM

## 2015-06-12 NOTE — Telephone Encounter (Signed)
-----   Message from Sueanne Margarita, MD sent at 06/08/2015 11:36 PM EDT ----- 1-39% bilateral ICA stenosis - repeat scan in 2 years

## 2015-06-12 NOTE — Telephone Encounter (Signed)
Informed patient of results and verbal understanding expressed.   Repeat carotids ordered to be scheduled in 2 years. Patient agrees with treatment plan. 

## 2015-06-16 ENCOUNTER — Other Ambulatory Visit: Payer: Self-pay | Admitting: Cardiology

## 2015-06-17 ENCOUNTER — Other Ambulatory Visit: Payer: PPO

## 2015-06-20 ENCOUNTER — Ambulatory Visit: Payer: PPO | Admitting: Endocrinology

## 2015-07-03 ENCOUNTER — Other Ambulatory Visit (INDEPENDENT_AMBULATORY_CARE_PROVIDER_SITE_OTHER): Payer: PPO

## 2015-07-03 DIAGNOSIS — E1165 Type 2 diabetes mellitus with hyperglycemia: Secondary | ICD-10-CM

## 2015-07-03 DIAGNOSIS — E038 Other specified hypothyroidism: Secondary | ICD-10-CM | POA: Diagnosis not present

## 2015-07-03 DIAGNOSIS — E063 Autoimmune thyroiditis: Secondary | ICD-10-CM

## 2015-07-03 DIAGNOSIS — IMO0002 Reserved for concepts with insufficient information to code with codable children: Secondary | ICD-10-CM

## 2015-07-03 LAB — COMPREHENSIVE METABOLIC PANEL
ALK PHOS: 67 U/L (ref 39–117)
ALT: 14 U/L (ref 0–35)
AST: 15 U/L (ref 0–37)
Albumin: 3.3 g/dL — ABNORMAL LOW (ref 3.5–5.2)
BILIRUBIN TOTAL: 0.4 mg/dL (ref 0.2–1.2)
BUN: 26 mg/dL — ABNORMAL HIGH (ref 6–23)
CALCIUM: 9.2 mg/dL (ref 8.4–10.5)
CO2: 28 meq/L (ref 19–32)
Chloride: 104 mEq/L (ref 96–112)
Creatinine, Ser: 1.08 mg/dL (ref 0.40–1.20)
GFR: 52.64 mL/min — AB (ref >60.00)
Glucose, Bld: 120 mg/dL — ABNORMAL HIGH (ref 70–99)
POTASSIUM: 4.7 meq/L (ref 3.5–5.1)
Sodium: 138 mEq/L (ref 135–145)
TOTAL PROTEIN: 6.7 g/dL (ref 6.0–8.3)

## 2015-07-03 LAB — TSH: TSH: 1.53 u[IU]/mL (ref 0.35–4.50)

## 2015-07-03 LAB — T4, FREE: FREE T4: 1.24 ng/dL (ref 0.60–1.60)

## 2015-07-03 LAB — HEMOGLOBIN A1C: HEMOGLOBIN A1C: 7.5 % — AB (ref 4.6–6.5)

## 2015-07-07 ENCOUNTER — Encounter: Payer: Self-pay | Admitting: Endocrinology

## 2015-07-07 ENCOUNTER — Ambulatory Visit (INDEPENDENT_AMBULATORY_CARE_PROVIDER_SITE_OTHER): Payer: PPO | Admitting: Endocrinology

## 2015-07-07 ENCOUNTER — Other Ambulatory Visit: Payer: Self-pay | Admitting: *Deleted

## 2015-07-07 VITALS — BP 138/58 | HR 75 | Temp 98.2°F | Resp 16 | Ht 61.0 in | Wt 233.6 lb

## 2015-07-07 DIAGNOSIS — I10 Essential (primary) hypertension: Secondary | ICD-10-CM | POA: Diagnosis not present

## 2015-07-07 DIAGNOSIS — E038 Other specified hypothyroidism: Secondary | ICD-10-CM

## 2015-07-07 DIAGNOSIS — E063 Autoimmune thyroiditis: Secondary | ICD-10-CM

## 2015-07-07 DIAGNOSIS — E1165 Type 2 diabetes mellitus with hyperglycemia: Secondary | ICD-10-CM | POA: Diagnosis not present

## 2015-07-07 DIAGNOSIS — Z794 Long term (current) use of insulin: Secondary | ICD-10-CM

## 2015-07-07 DIAGNOSIS — E782 Mixed hyperlipidemia: Secondary | ICD-10-CM

## 2015-07-07 MED ORDER — METFORMIN HCL 500 MG PO TABS: ORAL_TABLET | ORAL | Status: DC

## 2015-07-07 NOTE — Patient Instructions (Addendum)
Check blood sugars on waking up 3  times a week Also check blood sugars about 2 hours after a meal and do this after different meals by rotation  Recommended blood sugar levels on waking up is 90-130 and about 2 hours after meal is 130-160  Please bring your blood sugar monitor to each visit, thank you  R insulin 14 units before supper so that nite sugar <180  Novolon N: reduce 30 for now;  if sugars getting low overnite reduce more  Take 1 Metformin in am with food and 2 at dinner

## 2015-07-07 NOTE — Progress Notes (Signed)
Patient ID: Tina Hester, female   DOB: 1941-08-21, 74 y.o.   MRN: [id number]   Reason for Appointment: Diabetes follow-up   History of Present Illness   Diagnosis: Type 2 DIABETES MELITUS, long-standing    Previous history:  She has been on insulin for a few years. Because of cost she has been switched from Lantus to NPH insulin twice a day She had previously been taking metformin but this was stopped when she had renal insufficiency Overall her blood sugars have been difficult to control and she has previously had relatively poor control. However with adding Actos her blood sugars had started improving significantly Also with her being very consistent with diet in 2015 her A1c had gone down to 6.4  Recent history:    Insulin regimen: Humulin N 35  hs.    Regular Insulin 10 units acs   Her blood sugars have been overall inadequately controlled in 2016 She had steroids and summer and this caused hyperglycemia More recently she has been started on regular insulin before supper time because of high at bedtime readings A1c indicates stable controlled with level of 7.5  Current blood sugar patterns, management and problems identified:  She has check blood sugars mostly in the mornings and only 2 readings after supper despite instructions to do more   She was initially given 6 units of regular insulin before supper and she has gone up to 10 units, however she thinks she is adjusting this based on fasting reading the next day  She did have a feeling of hypoglycemia overnight once but her blood sugar was only 95  She still is inconsistent with her diet and has not lost any weight  Morning sugars are reasonably good with some fluctuation although higher than previously since she has reduced her NPH to 35 instead of 40.   Her dose of metformin has been limited by previous renal dysfunction      Oral hypoglycemic drugs: Actos, Metformin 500 mg bid       Side effects from  medications: None, no edema with 15 mg Actos       Monitors blood glucose: occasionally     Glucometer:  FreeStyle       Blood Glucose readings from download:   Mean values apply above for all meters except median for One Touch  PRE-MEAL Fasting Lunch  3 PM  Bedtime Overall  Glucose range:  125-199    221   256, 228    Mean/median:  148      167    Hypoglycemia: Has had only one low normal blood sugar overnight Meals: 2-3 meals per day. Dinner 7-8  [employer] diet, usually egg and toast       Physical activity: exercise: walking 15-20 min occasionally    Wt Readings from Last 3 Encounters:  07/07/15 233 lb 9.6 oz (105.96 kg)  05/26/15 233 lb 9.6 oz (105.96 kg)  04/21/15 230 lb (104.327 kg)   LABS:  Lab Results  Component Value Date   HGBA1C 7.5* 07/03/2015   HGBA1C 7.5* 02/27/2015   HGBA1C 6.7* 10/09/2014   Lab Results  Component Value Date   MICROALBUR 1.3 10/09/2014   [place] 75 02/27/2015   CREATININE 1.08 07/03/2015       Medication List       This list is accurate as of: 07/07/15 11:59 PM.  Always use your most recent med list.  ALPRAZolam 0.25 MG tablet  Commonly known as:  XANAX  Take 0.25 mg by mouth at bedtime as needed for anxiety.     amLODipine 5 MG tablet  Commonly known as:  NORVASC  TAKE 1 TABLET BY MOUTH EVERY DAY     apixaban 5 MG Tabs tablet  Commonly known as:  ELIQUIS  Take 1 tablet (5 mg total) by mouth 2 (two) times daily.     atorvastatin 40 MG tablet  Commonly known as:  LIPITOR  Take 1 tablet (40 mg total) by mouth daily at 6 PM.     atropine 1 % ophthalmic solution  INSTILL 1 DROP INTO THE RIGHT EYE DAILY     bacitracin ophthalmic ointment  APPLY IN RIGHT EYE AT BEDTIME     CALCIUM PO  Take by mouth.     COLCRYS 0.6 MG tablet  Generic drug:  colchicine  as needed.     fenofibrate 145 MG tablet  Commonly known as:  TRICOR  Take 145 mg by mouth. TAKING 3 TIMES A WEEK     glucose blood test strip    Commonly known as:  FREESTYLE LITE  Use to test blood sugar twice daily. Dx: E11.9     insulin NPH Human 100 UNIT/ML injection  Commonly known as:  NOVOLIN N  Inject 20 units every morning and 45 units every night     insulin regular 100 units/mL injection  Commonly known as:  NOVOLIN R RELION  Inject 0.06-0.1 mLs (6-10 Units total) into the skin 3 (three) times daily before meals.     INSULIN SYRINGE .5CC/30GX5/16" 30G X 5/16" 0.5 ML Misc  Use 2 needles per day to inject insulin     levothyroxine 137 MCG tablet  Commonly known as:  SYNTHROID, LEVOTHROID  Take 137 mcg by mouth daily before breakfast.     lisinopril-hydrochlorothiazide 20-25 MG tablet  Commonly known as:  PRINZIDE,ZESTORETIC  Take 1 tablet by mouth daily.     metFORMIN 500 MG tablet  Commonly known as:  GLUCOPHAGE  Take 1 tablet every morning and 2 tablets every evening     metoprolol succinate 25 MG 24 hr tablet  Commonly known as:  TOPROL-XL  TAKE 1 TABLET BY MOUTH TWICE DAILY     multivitamin capsule  Take 1 capsule by mouth daily.     NEEDLE (DISP) 30 G 30G X 1/2" Misc  Commonly known as:  BD DISP NEEDLES  1 each by Does not apply route 2 (two) times daily before a meal.     ONETOUCH DELICA LANCETS 47Q Misc  Use to check blood sugars once daily     pioglitazone 15 MG tablet  Commonly known as:  ACTOS  TAKE 1 TABLET BY MOUTH EVERY DAY     prednisoLONE acetate 1 % ophthalmic suspension  Commonly known as:  PRED FORTE  Place 4 drops into the right eye.     predniSONE 10 MG tablet  Commonly known as:  DELTASONE  TK 1 T PO FIVE TIMES DAILY     ULORIC 40 MG tablet  Generic drug:  febuxostat  40 mg.     valACYclovir 500 MG tablet  Commonly known as:  VALTREX  Take 500 mg by mouth 3 (three) times daily.     vitamin B-12 1000 MCG tablet  Commonly known as:  CYANOCOBALAMIN  Take 1,000 mcg by mouth daily.        Allergies:  Allergies  Allergen Reactions  . Pneumovax 23 [  Pneumococcal Vac  Polyvalent] Anaphylaxis    weakness  . Sulfa Antibiotics Hives    Past Medical History  Diagnosis Date  . Diabetes mellitus without complication ([place])     type 2  . GERD (gastroesophageal reflux disease)   . Hyperlipidemia   . Palpitations   . OSA on CPAP   . Anxiety   . TIA (transient ischemic attack)     remote history of TIA's early 2000  . Anemia     s/p Heme work -up normal EGD and colonscopy in 2012 per pt,neg SPEP  . HSV-1 (herpes simplex virus 1) infection     Acyclovir prn  . Joint pain     osteoarthritis by Xray- possible erosion of R 4th MCP,elevated uric  acid ,ANA +   . Morbid obesity ([place])   . Gout   . Osteopenia   . Pain management     Neurosurg: Dr Clydell Hakim  . CKD (chronic kidney disease), stage III   . Aortic valve disorders   . Hypertension   . PAF (paroxysmal atrial fibrillation) ([place])   . Bilateral carotid artery stenosis 06/08/2015    1-39% bilateral    No past surgical history on file.  Family History  Problem Relation Age of Onset  . Arrhythmia Brother     Social History:  reports that she has never smoked. She has never used smokeless tobacco. She reports that she does not drink alcohol or use illicit drugs.  Review of Systems:   Hypertension:  blood pressure is fairly well controlled She is currently taking Zestoretic, low dose metoprolol and amlodipine 5 mg.   Lipids: Triglycerides have been high, now controlled on fenofibrate, LDL has been below 100 with Lipitor 40 mg   Lab Results  Component Value Date   CHOL 132 02/27/2015   HDL 37.80* 02/27/2015   LDLCALC 75 02/27/2015   LDLDIRECT 97.2 05/31/2013   TRIG 96.0 02/27/2015   CHOLHDL 3 02/27/2015    Renal insufficiency: Her creatinine has been high normal previously but appears near normal consistently;  she is followed by nephrologist annually also    Lab Results  Component Value Date   CREATININE 1.08 07/03/2015   She has hypothyroidism and is taking 137 g  levothyroxine, recent TSH normal   Lab Results  Component Value Date   TSH 1.53 07/03/2015     Diabetic foot exam  as of 6/16 shows normal monofilament sensation in the toes and plantar surfaces, no skin lesions or ulcers on the feet and normal pedal pulses    Examination:   BP 138/58 mmHg  Pulse 75  Temp(Src) 98.2 F (36.8 C)  Resp 16  Ht 5\' 1"  (1.549 m)  Wt 233 lb 9.6 oz (105.96 kg)  BMI 44.16 kg/m2  SpO2 98%  Body mass index is 44.16 kg/(m^2).    No edema   ASSESSMENT/ PLAN:   Diabetes type 2 with obesity :   Blood glucose control is  improving and A1c is 7.5 See history of present illness for detailed discussion of his current management, blood sugar patterns and problems identified She still is not checking her blood sugars as directed after supper to help adjust her mealtime coverage 2 readings that she has recently at over 200 Also her fasting blood sugars are overall mildly increased Having difficulty losing weight Also currently is taking only 1000 mg of metformin a day  Recommendations made today:  More consistent monitoring of blood sugars 2 hours after meals including after  breakfast and lunch.  Discussed that if her sugars are higher after breakfast and lunch she may need Regular Insulin with all meals  Since her blood sugars are consistently high after dinner/at bedtime she needs to use 14 units in the evening been adjusted based on her basal size  Will reduce her Novolin NPH to 30 units at the same time but may adjusted further if fasting blood sugars are not in target range that was discussed  Increased metformin to 2 tablets in the evening, may consider metformin ER should she have any diarrhea  Continue Actos for insulin resistance  More consistent walking  Balanced meals with limited carbohydrates and fats   Hypertension: Appears controlled and is followed by PCP and nephrologist   Counseling time on subjects discussed above is over 50%  of today's 25 minute visit  Patient Instructions  Check blood sugars on waking up 3  times a week Also check blood sugars about 2 hours after a meal and do this after different meals by rotation  Recommended blood sugar levels on waking up is 90-130 and about 2 hours after meal is 130-160  Please bring your blood sugar monitor to each visit, thank you  R insulin 14 units before supper so that nite sugar <180  Novolon N: reduce 30 for now;  if sugars getting low overnite reduce more  Take 1 Metformin in am with food and 2 at dinner       [place] 07/08/2015, 8:05 AM      Appointment on 07/03/2015  Component Date Value Ref Range Status  . Hgb A1c MFr Bld 07/03/2015 7.5* 4.6 - 6.5 % Final   Glycemic Control Guidelines for People with Diabetes:Non Diabetic:  <6%Goal of Therapy: <7%Additional Action Suggested:  >8%   . Sodium 07/03/2015 138  135 - 145 mEq/L Final  . Potassium 07/03/2015 4.7  3.5 - 5.1 mEq/L Final  . Chloride 07/03/2015 104  96 - 112 mEq/L Final  . CO2 07/03/2015 28  19 - 32 mEq/L Final  . Glucose, Bld 07/03/2015 120* 70 - 99 mg/dL Final  . BUN 07/03/2015 26* 6 - 23 mg/dL Final  . Creatinine, Ser 07/03/2015 1.08  0.40 - 1.20 mg/dL Final  . Total Bilirubin 07/03/2015 0.4  0.2 - 1.2 mg/dL Final  . Alkaline Phosphatase 07/03/2015 67  39 - 117 U/L Final  . AST 07/03/2015 15  0 - 37 U/L Final  . ALT 07/03/2015 14  0 - 35 U/L Final  . Total Protein 07/03/2015 6.7  6.0 - 8.3 g/dL Final  . Albumin 07/03/2015 3.3* 3.5 - 5.2 g/dL Final  . Calcium 07/03/2015 9.2  8.4 - 10.5 mg/dL Final  . GFR 07/03/2015 52.64* >60.00 mL/min Final  . TSH 07/03/2015 1.53  0.35 - 4.50 uIU/mL Final  . Free T4 07/03/2015 1.24  0.60 - 1.60 ng/dL Final

## 2015-07-22 ENCOUNTER — Ambulatory Visit: Payer: PPO | Admitting: Endocrinology

## 2015-08-14 ENCOUNTER — Encounter: Payer: Self-pay | Admitting: Endocrinology

## 2015-08-14 ENCOUNTER — Ambulatory Visit (INDEPENDENT_AMBULATORY_CARE_PROVIDER_SITE_OTHER): Payer: PPO | Admitting: Endocrinology

## 2015-08-14 VITALS — BP 124/52 | HR 64 | Temp 97.4°F | Resp 16 | Ht 61.0 in | Wt 231.8 lb

## 2015-08-14 DIAGNOSIS — Z794 Long term (current) use of insulin: Secondary | ICD-10-CM

## 2015-08-14 DIAGNOSIS — E1165 Type 2 diabetes mellitus with hyperglycemia: Secondary | ICD-10-CM | POA: Diagnosis not present

## 2015-08-14 NOTE — Progress Notes (Signed)
Patient ID: Tina Hester, female   DOB: Jan 27, 1941, 74 y.o.   MRN: [id number]   Reason for Appointment: Diabetes follow-up   History of Present Illness   Diagnosis: Type 2 DIABETES MELITUS, long-standing    Previous history:  She has been on insulin for a few years. Because of cost she has been switched from Lantus to NPH insulin twice a day She had previously been taking metformin but this was stopped when she had renal insufficiency Overall her blood sugars have been difficult to control and she has previously had relatively poor control. However with adding Actos her blood sugars had started improving significantly Also with her being very consistent with diet in 2015 her A1c had gone down to 6.4  Recent history:    Insulin regimen: Humulin N 30  hs.    Regular Insulin 0-15 units at suppertime     Her blood sugars have been overall inadequately controlled in 2016  She has been started on regular insulin before supper time because of high at bedtime readings, frequently over 200 She is now here for follow-up for insulin adjustment as she did have inadequate glucose monitoring on her last visit Also her metformin was increased by 500 mg which she is tolerating well  A1c indicates stable control control as of 11/16, A1c 7.5  Current blood sugar patterns, management and problems identified:  She has been checking evening readings sporadically again and very few recently; most of these are high  She says because of recent family involvement she has not been consistent with taking care of herself  However she was told to increase her insulin from 10 up to 14 units at suppertime and with this she reports having hypoglycemia at 4 AM.  She has only one documented low blood sugar of 57 but she thinks she was significantly symptomatic with her last sugar at 4 AM  Currently she thinks she is taking her regular insulin in the right before or right after eating instead of  30 minutes before; because of her hypoglycemia she has not taken any insulin at suppertime last 3 nights  She did reduce her NPH to 30 units at bedtime  She is not checking her blood sugars during the day and today after a hamburger or glucose was 184, not taking any insulin with breakfast or lunch when she is eating these meals       Oral hypoglycemic drugs: Actos, Metformin 1500 mg        Side effects from medications: None, no edema with 15 mg Actos       Monitors blood glucose: occasionally     Glucometer:  FreeStyle       Blood Glucose readings from download:   Mean values apply above for all meters except median for One Touch  PRE-MEAL Fasting Lunch Dinner Bedtime Overall  Glucose range:  107-142     161-222    Mean/median:  129     185   145    POST-MEAL PC Breakfast PC Lunch PC Dinner  Glucose range:  184 today    Mean/median:       Meals: 2-3 meals per day. Dinner 7 pm  Weight Watchers diet, usually egg and toast       Physical activity: exercise: walking 15-20 min occasionally    Wt Readings from Last 3 Encounters:  08/14/15 231 lb 12.8 oz (105.144 kg)  07/07/15 233 lb 9.6 oz (105.96 kg)  05/26/15 233 lb 9.6 oz (  105.96 kg)   LABS:  Lab Results  Component Value Date   HGBA1C 7.5* 07/03/2015   HGBA1C 7.5* 02/27/2015   HGBA1C 6.7* 10/09/2014   Lab Results  Component Value Date   MICROALBUR 1.3 10/09/2014   LDLCALC 75 02/27/2015   CREATININE 1.08 07/03/2015       Medication List       This list is accurate as of: 08/14/15  4:46 PM.  Always use your most recent med list.               ALPRAZolam 0.25 MG tablet  Commonly known as:  XANAX  Take 0.25 mg by mouth at bedtime as needed for anxiety.     amLODipine 5 MG tablet  Commonly known as:  NORVASC  TAKE 1 TABLET BY MOUTH EVERY DAY     apixaban 5 MG Tabs tablet  Commonly known as:  ELIQUIS  Take 1 tablet (5 mg total) by mouth 2 (two) times daily.     atorvastatin 40 MG tablet  Commonly known  as:  LIPITOR  Take 1 tablet (40 mg total) by mouth daily at 6 PM.     atropine 1 % ophthalmic solution  INSTILL 1 DROP INTO THE RIGHT EYE DAILY     bacitracin ophthalmic ointment  APPLY IN RIGHT EYE AT BEDTIME     CALCIUM PO  Take by mouth.     COLCRYS 0.6 MG tablet  Generic drug:  colchicine  as needed.     fenofibrate 145 MG tablet  Commonly known as:  TRICOR  Take 145 mg by mouth. TAKING 3 TIMES A WEEK     gabapentin 100 MG capsule  Commonly known as:  NEURONTIN  Take 100 mg by mouth daily.     glucose blood test strip  Commonly known as:  FREESTYLE LITE  Use to test blood sugar twice daily. Dx: E11.9     insulin NPH Human 100 UNIT/ML injection  Commonly known as:  NOVOLIN N  Inject 20 units every morning and 45 units every night     insulin regular 100 units/mL injection  Commonly known as:  NOVOLIN R RELION  Inject 0.06-0.1 mLs (6-10 Units total) into the skin 3 (three) times daily before meals.     INSULIN SYRINGE .5CC/30GX5/16" 30G X 5/16" 0.5 ML Misc  Use 2 needles per day to inject insulin     levothyroxine 137 MCG tablet  Commonly known as:  SYNTHROID, LEVOTHROID  Take 137 mcg by mouth daily before breakfast.     lisinopril-hydrochlorothiazide 20-25 MG tablet  Commonly known as:  PRINZIDE,ZESTORETIC  Take 1 tablet by mouth daily.     metFORMIN 500 MG tablet  Commonly known as:  GLUCOPHAGE  Take 1 tablet every morning and 2 tablets every evening     metoprolol succinate 25 MG 24 hr tablet  Commonly known as:  TOPROL-XL  TAKE 1 TABLET BY MOUTH TWICE DAILY     multivitamin capsule  Take 1 capsule by mouth daily.     NEEDLE (DISP) 30 G 30G X 1/2" Misc  Commonly known as:  BD DISP NEEDLES  1 each by Does not apply route 2 (two) times daily before a meal.     ONETOUCH DELICA LANCETS 99991111 Misc  Use to check blood sugars once daily     pioglitazone 15 MG tablet  Commonly known as:  ACTOS  TAKE 1 TABLET BY MOUTH EVERY DAY     prednisoLONE acetate 1  % ophthalmic  suspension  Commonly known as:  PRED FORTE  Place 4 drops into the right eye.     predniSONE 10 MG tablet  Commonly known as:  DELTASONE  TK 1 T PO FIVE TIMES DAILY     ULORIC 40 MG tablet  Generic drug:  febuxostat  40 mg.     valACYclovir 500 MG tablet  Commonly known as:  VALTREX  Take 500 mg by mouth 3 (three) times daily.     vitamin B-12 1000 MCG tablet  Commonly known as:  CYANOCOBALAMIN  Take 1,000 mcg by mouth daily.        Allergies:  Allergies  Allergen Reactions  . Pneumovax 23 [Pneumococcal Vac Polyvalent] Anaphylaxis    weakness  . Sulfa Antibiotics Hives    Past Medical History  Diagnosis Date  . Diabetes mellitus without complication ([place])     type 2  . GERD (gastroesophageal reflux disease)   . Hyperlipidemia   . Palpitations   . OSA on CPAP   . Anxiety   . TIA (transient ischemic attack)     remote history of TIA's early 2000  . Anemia     s/p Heme work -up normal EGD and colonscopy in 2012 per pt,neg SPEP  . HSV-1 (herpes simplex virus 1) infection     Acyclovir prn  . Joint pain     osteoarthritis by Xray- possible erosion of R 4th MCP,elevated uric  acid ,ANA +   . Morbid obesity ([place])   . Gout   . Osteopenia   . Pain management     Neurosurg: Dr Clydell Hakim  . CKD (chronic kidney disease), stage III   . Aortic valve disorders   . Hypertension   . PAF (paroxysmal atrial fibrillation) ([place])   . Bilateral carotid artery stenosis 06/08/2015    1-39% bilateral    No past surgical history on file.  Family History  Problem Relation Age of Onset  . Arrhythmia Brother     Social History:  reports that she has never smoked. She has never used smokeless tobacco. She reports that she does not drink alcohol or use illicit drugs.  Review of Systems:   Hypertension:  blood pressure is fairly well controlled She is currently taking Zestoretic, low dose metoprolol and amlodipine 5 mg.   Lipids: Triglycerides have been high,  now controlled on fenofibrate, LDL has been below 100 with Lipitor 40 mg   Lab Results  Component Value Date   CHOL 132 02/27/2015   HDL 37.80* 02/27/2015   LDLCALC 75 02/27/2015   LDLDIRECT 97.2 05/31/2013   TRIG 96.0 02/27/2015   CHOLHDL 3 02/27/2015    Renal insufficiency: Her creatinine has been high normal previously but appears near normal consistently;  she is followed by nephrologist annually     Lab Results  Component Value Date   CREATININE 1.08 07/03/2015   She has hypothyroidism and is taking 137 g levothyroxine, recent TSH normal   Lab Results  Component Value Date   TSH 1.53 07/03/2015     Diabetic foot exam  as of 6/16 shows normal monofilament sensation in the toes and plantar surfaces, no skin lesions or ulcers on the feet and normal pedal pulses    Examination:   BP 124/52 mmHg  Pulse 64  Temp(Src) 97.4 F (36.3 C) (Oral)  Resp 16  Ht 5\' 1"  (1.549 m)  Wt 231 lb 12.8 oz (105.144 kg)  BMI 43.82 kg/m2  SpO2 99%  Body mass index is  43.82 kg/(m^2).    No edema   ASSESSMENT/ PLAN:   Diabetes type 2 with obesity :   Blood glucose control is  improving and A1c is 7.5 See history of present illness for detailed discussion of his current management, blood sugar patterns and problems identified She is still having consistently high readings after supper even when she takes 10 units of Regular Insulin  She appears to have an adequate understanding about the timing and duration of action of Regular Insulin as well as mealtime coverage. Also she had limited choices because of her not being able to afford brand name insulin She still is not checking her blood sugars often enough as directed after supper to help adjust her mealtime coverage and none after her other meals  Also her fasting blood sugars are overall relatively good recently, probably with increasing metformin  Recommendations made today:  More consistent monitoring of blood sugars 2 hours  after meals especially supper  She can try 10 units of regular insulin 30 minutes before supper for now and increase the dose if two-hour postprandial readings are consistently over 180, she can take 12-14 units if needed  Reduce NPH INSULIN to 25 units while restarting Regular Insulin  She will need to take 6-8 units of insulin for eating breakfast and lunch if including carbohydrates  Also discussed potentially using Novolog if she is tending to have late-night hypoglycemia but also high postprandial readings  Continue metformin unchanged  Call if blood sugars are not consistently controlled  She was recommended follow-up with nurse educator to help her better understand insulin regimen, diet and adjustment of insulin dose     Counseling time on subjects discussed above is over 50% of today's 25 minute visit  Patient Instructions  Check blood sugars on waking up 3-4  times a week Also check blood sugars about 2 hours after a meal and do this after different meals by rotation  Recommended blood sugar levels on waking up is 90-130 and about 2 hours after meal is 130-160  Please bring your blood sugar monitor to each visit, thank you  REGULAR insulin 5 UNITS at lunch when eating out or high Carbs; 10 units before supper.   Check cost of Novolog  MUST TAKE R 30 MIN BEFORE EATING  N INSULIN 25 units AT BEDTIME        Ramzy Cappelletti 08/14/2015, 4:46 PM

## 2015-08-14 NOTE — Patient Instructions (Addendum)
Check blood sugars on waking up 3-4  times a week Also check blood sugars about 2 hours after a meal and do this after different meals by rotation  Recommended blood sugar levels on waking up is 90-130 and about 2 hours after meal is 130-160  Please bring your blood sugar monitor to each visit, thank you  REGULAR insulin 5 UNITS at lunch when eating out or high Carbs; 10 units before supper.   Check cost of Novolog  MUST TAKE R 30 MIN BEFORE EATING  N INSULIN 25 units AT BEDTIME

## 2015-08-28 ENCOUNTER — Other Ambulatory Visit: Payer: Self-pay | Admitting: Cardiology

## 2015-09-02 ENCOUNTER — Encounter: Payer: PPO | Attending: Endocrinology | Admitting: Nutrition

## 2015-09-02 DIAGNOSIS — Z713 Dietary counseling and surveillance: Secondary | ICD-10-CM | POA: Insufficient documentation

## 2015-09-02 DIAGNOSIS — E1165 Type 2 diabetes mellitus with hyperglycemia: Secondary | ICD-10-CM | POA: Diagnosis not present

## 2015-09-03 DIAGNOSIS — M542 Cervicalgia: Secondary | ICD-10-CM | POA: Diagnosis not present

## 2015-09-03 NOTE — Progress Notes (Signed)
Typical day: 8AM gets up 10AM: sweetened oatmeal, 500 mg. Meformin 12AM: lunch: sandwich, with mayo, chips, diet coke 2PM: large apple 4PM: raw veg.  6-8:30PM: supper:  6 ounces of protein, 1-2 veg., diet coke. 10PM: snack:  2 small candy bars--mini  Insulin dose:  NPH: 25u before supper                      No Regular insulin: says will drop her blood sugar low.  She reports having taken it one time before going out to dinner for hamburger and fries.  Said no low blood sugar then.   SBGM:  Did not bring meter  Says FBSs: 150s -170s  Not testing qAM, or any other time.  Encouraged testing acB, HS, and 2hr. pc once meal every day.  Freestyle Test strips given for this.    I will call her in one week to see how blood sugars are doing on no R insulin.    Exercise:  Was walking for 1 hour 3X/wk, but not doing this now due to cold.  Walks dogs slowly for 15 min. 2X/day  She reports going to weight watchers 2 years ago and lost 30 pounds.  We reviewed that diet and she sees that she was eating less with more protein and less carbs.  Strongly encouraged her to do this again.

## 2015-09-03 NOTE — Patient Instructions (Signed)
Test blood sugars at bedtime and before breakfast every day.  Also test once 2hr. After one meal Add protein to breakfast meal

## 2015-09-04 ENCOUNTER — Telehealth: Payer: Self-pay | Admitting: Cardiology

## 2015-09-04 NOTE — Telephone Encounter (Signed)
New Message  Pt called states that she is returning the call

## 2015-09-04 NOTE — Telephone Encounter (Signed)
Patient st she was returning a phone call about her CPAP.  Informed patient that Tina Hester will call her back to review questions she has.

## 2015-09-05 DIAGNOSIS — M542 Cervicalgia: Secondary | ICD-10-CM | POA: Diagnosis not present

## 2015-09-05 NOTE — Telephone Encounter (Signed)
Left message for patient to call back  

## 2015-09-09 NOTE — Telephone Encounter (Signed)
Patient stated that the order that was sent to [facility] was not received, so I need to fax it again.   I have faxed them the order for supplies.  Patient will call me if she has any issues

## 2015-09-10 ENCOUNTER — Telehealth: Payer: Self-pay | Admitting: Nutrition

## 2015-09-10 NOTE — Telephone Encounter (Signed)
Message left on her machine to call me with blood sugars over the last week.

## 2015-09-12 ENCOUNTER — Telehealth: Payer: Self-pay | Admitting: Cardiology

## 2015-09-12 NOTE — Telephone Encounter (Signed)
error 

## 2015-09-22 ENCOUNTER — Other Ambulatory Visit (INDEPENDENT_AMBULATORY_CARE_PROVIDER_SITE_OTHER): Payer: PPO

## 2015-09-22 ENCOUNTER — Other Ambulatory Visit: Payer: Self-pay | Admitting: *Deleted

## 2015-09-22 DIAGNOSIS — Z794 Long term (current) use of insulin: Secondary | ICD-10-CM | POA: Diagnosis not present

## 2015-09-22 DIAGNOSIS — G4733 Obstructive sleep apnea (adult) (pediatric): Secondary | ICD-10-CM

## 2015-09-22 DIAGNOSIS — E1165 Type 2 diabetes mellitus with hyperglycemia: Secondary | ICD-10-CM | POA: Diagnosis not present

## 2015-09-22 LAB — COMPREHENSIVE METABOLIC PANEL
ALBUMIN: 3.6 g/dL (ref 3.5–5.2)
ALT: 15 U/L (ref 0–35)
AST: 15 U/L (ref 0–37)
Alkaline Phosphatase: 68 U/L (ref 39–117)
BUN: 28 mg/dL — ABNORMAL HIGH (ref 6–23)
CALCIUM: 9 mg/dL (ref 8.4–10.5)
CHLORIDE: 105 meq/L (ref 96–112)
CO2: 25 mEq/L (ref 19–32)
CREATININE: 1.05 mg/dL (ref 0.40–1.20)
GFR: 54.35 mL/min — AB (ref >60.00)
Glucose, Bld: 155 mg/dL — ABNORMAL HIGH (ref 70–99)
POTASSIUM: 4.6 meq/L (ref 3.5–5.1)
Sodium: 139 mEq/L (ref 135–145)
Total Bilirubin: 0.5 mg/dL (ref 0.2–1.2)
Total Protein: 6.7 g/dL (ref 6.0–8.3)

## 2015-09-22 LAB — MICROALBUMIN / CREATININE URINE RATIO
CREATININE, U: 87.4 mg/dL
MICROALB UR: 6.8 mg/dL — AB (ref 0.0–1.9)
Microalb Creat Ratio: 7.8 mg/g (ref 0.0–30.0)

## 2015-09-22 LAB — HEMOGLOBIN A1C: Hgb A1c MFr Bld: 7.7 % — ABNORMAL HIGH (ref 4.6–6.5)

## 2015-09-22 LAB — TSH: TSH: 1.94 u[IU]/mL (ref 0.35–4.50)

## 2015-09-25 ENCOUNTER — Ambulatory Visit: Payer: PPO | Admitting: Endocrinology

## 2015-09-28 ENCOUNTER — Other Ambulatory Visit: Payer: Self-pay | Admitting: Cardiology

## 2015-09-29 ENCOUNTER — Telehealth: Payer: Self-pay | Admitting: Cardiology

## 2015-09-29 MED ORDER — APIXABAN 5 MG PO TABS: 5.0000 mg | ORAL_TABLET | Freq: Two times a day (BID) | ORAL | Status: DC

## 2015-09-29 NOTE — Telephone Encounter (Signed)
°*  STAT* If patient is at the pharmacy, call can be transferred to refill team.   1. Which medications need to be refilled? (please list name of each medication and dose if known) Eliquis 5mg   2. Which pharmacy/location (including street and city if local pharmacy) is medication to be sent to?[facility]/[place]  3. Do they need a 30 day or 90 day supply? [place]

## 2015-09-29 NOTE — Telephone Encounter (Signed)
Pt's Rx was sent to pt's pharmacy as requested. Confirmation received.  °

## 2015-10-01 ENCOUNTER — Ambulatory Visit (INDEPENDENT_AMBULATORY_CARE_PROVIDER_SITE_OTHER): Payer: PPO | Admitting: Endocrinology

## 2015-10-01 VITALS — BP 124/62 | HR 65 | Temp 97.5°F | Resp 16 | Ht 61.0 in | Wt 230.0 lb

## 2015-10-01 DIAGNOSIS — E1165 Type 2 diabetes mellitus with hyperglycemia: Secondary | ICD-10-CM | POA: Diagnosis not present

## 2015-10-01 DIAGNOSIS — Z794 Long term (current) use of insulin: Secondary | ICD-10-CM | POA: Diagnosis not present

## 2015-10-01 NOTE — Progress Notes (Signed)
Patient ID: Tina Hester, female   DOB: 1941-07-04, 75 y.o.   MRN: [id number]   Reason for Appointment: Diabetes follow-up   History of Present Illness   Diagnosis: Type 2 DIABETES MELITUS, long-standing    Previous history:  She has been on insulin for a few years. Because of cost she has been switched from Lantus to NPH insulin twice a day She had previously been taking metformin but this was stopped when she had renal insufficiency Overall her blood sugars have been difficult to control and she has previously had relatively poor control. However with adding Actos her blood sugars had started improving significantly Also with her being very consistent with diet in 2015 her A1c had gone down to 6.4  Recent history:    Insulin regimen: Humulin N 25  hs.    Regular Insulin 10 units at suppertime   Oral hypoglycemic drugs: Actos, Metformin 1500 mg    Her blood sugars have been overall inadequately controlled in 2016 Her A1c is still relatively high at 7.7 now  She has been  on regular insulin before supper time in addition to her bedtime NPH insulin  Current blood sugar patterns, management and problems identified:  She has still mostly high readings after her evening meal and not checking very often  She has done better with taking insulin consistently before suppertime but usually taking it right before or within 10 minutes of her meal instead of 30 minutes before  She did have another episode of hypoglycemia around 12:30 AM which she thinks was from eating later other than the type of meals.  FASTING blood sugars are variable but mostly mildly increased  Her NPH was reduced on the last visit to avoid nocturnal hypoglycemia  She is not doing any POST prandial readings after her first meal late morning and none before supper time also  She is trying to walk with her dogs but not able to lose weight              Side effects from medications: None         Monitors blood glucose: occasionally     Glucometer:  FreeStyle       Blood Glucose readings from download:   Mean values apply above for all meters except median for One Touch  PRE-MEAL Fasting Lunch Dinner Bedtime Overall  Glucose range:  123-172     38-242    Mean/median:  150     200  165   Meals: 2-3 meals per day. Dinner around 7 pm  Am: usually egg and toast       Physical activity: exercise: walking 15-20 min or more daily now     Wt Readings from Last 3 Encounters:  10/01/15 230 lb (104.327 kg)  08/14/15 231 lb 12.8 oz (105.144 kg)  07/07/15 233 lb 9.6 oz (105.96 kg)   LABS:  Lab Results  Component Value Date   HGBA1C 7.7* 09/22/2015   HGBA1C 7.5* 07/03/2015   HGBA1C 7.5* 02/27/2015   Lab Results  Component Value Date   MICROALBUR 6.8* 09/22/2015   LDLCALC 75 02/27/2015   CREATININE 1.05 09/22/2015   No visits with results within 1 Week(s) from this visit. Latest known visit with results is:  Lab on 09/22/2015  Component Date Value Ref Range Status  . Hgb A1c MFr Bld 09/22/2015 7.7* 4.6 - 6.5 % Final   Glycemic Control Guidelines for People with Diabetes:Non Diabetic:  <6%Goal of Therapy: <7%Additional Action  Suggested:  >8%   . Sodium 09/22/2015 139  135 - 145 mEq/L Final  . Potassium 09/22/2015 4.6  3.5 - 5.1 mEq/L Final  . Chloride 09/22/2015 105  96 - 112 mEq/L Final  . CO2 09/22/2015 25  19 - 32 mEq/L Final  . Glucose, Bld 09/22/2015 155* 70 - 99 mg/dL Final  . BUN 09/22/2015 28* 6 - 23 mg/dL Final  . Creatinine, Ser 09/22/2015 1.05  0.40 - 1.20 mg/dL Final  . Total Bilirubin 09/22/2015 0.5  0.2 - 1.2 mg/dL Final  . Alkaline Phosphatase 09/22/2015 68  39 - 117 U/L Final  . AST 09/22/2015 15  0 - 37 U/L Final  . ALT 09/22/2015 15  0 - 35 U/L Final  . Total Protein 09/22/2015 6.7  6.0 - 8.3 g/dL Final  . Albumin 09/22/2015 3.6  3.5 - 5.2 g/dL Final  . Calcium 09/22/2015 9.0  8.4 - 10.5 mg/dL Final  . GFR 09/22/2015 54.35* >60.00 mL/min Final  . TSH  09/22/2015 1.94  0.35 - 4.50 uIU/mL Final  . Microalb, Ur 09/22/2015 6.8* 0.0 - 1.9 mg/dL Final  . Creatinine,U 09/22/2015 87.4   Final  . Microalb Creat Ratio 09/22/2015 7.8  0.0 - 30.0 mg/g Final       Medication List       This list is accurate as of: 10/01/15 12:57 PM.  Always use your most recent med list.               ALPRAZolam 0.25 MG tablet  Commonly known as:  XANAX  Take 0.25 mg by mouth at bedtime as needed for anxiety.     amLODipine 5 MG tablet  Commonly known as:  NORVASC  TAKE 1 TABLET BY MOUTH EVERY DAY     apixaban 5 MG Tabs tablet  Commonly known as:  ELIQUIS  Take 1 tablet (5 mg total) by mouth 2 (two) times daily.     atorvastatin 40 MG tablet  Commonly known as:  LIPITOR  Take 1 tablet (40 mg total) by mouth daily at 6 PM.     atropine 1 % ophthalmic solution  INSTILL 1 DROP INTO THE RIGHT EYE DAILY     bacitracin ophthalmic ointment  APPLY IN RIGHT EYE AT BEDTIME     CALCIUM PO  Take by mouth.     COLCRYS 0.6 MG tablet  Generic drug:  colchicine  as needed.     fenofibrate 145 MG tablet  Commonly known as:  TRICOR  Take 145 mg by mouth. TAKING 3 TIMES A WEEK     gabapentin 100 MG capsule  Commonly known as:  NEURONTIN  Take 100 mg by mouth daily.     glucose blood test strip  Commonly known as:  FREESTYLE LITE  Use to test blood sugar twice daily. Dx: E11.9     insulin NPH Human 100 UNIT/ML injection  Commonly known as:  NOVOLIN N  Inject 20 units every morning and 45 units every night     insulin regular 100 units/mL injection  Commonly known as:  NOVOLIN R RELION  Inject 0.06-0.1 mLs (6-10 Units total) into the skin 3 (three) times daily before meals.     INSULIN SYRINGE .5CC/30GX5/16" 30G X 5/16" 0.5 ML Misc  Use 2 needles per day to inject insulin     levothyroxine 137 MCG tablet  Commonly known as:  SYNTHROID, LEVOTHROID  Take 137 mcg by mouth daily before breakfast.     lisinopril-hydrochlorothiazide 20-25 MG  tablet    Commonly known as:  PRINZIDE,ZESTORETIC  Take 1 tablet by mouth daily.     metFORMIN 500 MG tablet  Commonly known as:  GLUCOPHAGE  Take 1 tablet every morning and 2 tablets every evening     metoprolol succinate 25 MG 24 hr tablet  Commonly known as:  TOPROL-XL  TAKE 1 TABLET BY MOUTH TWICE DAILY     multivitamin capsule  Take 1 capsule by mouth daily.     NEEDLE (DISP) 30 G 30G X 1/2" Misc  Commonly known as:  BD DISP NEEDLES  1 each by Does not apply route 2 (two) times daily before a meal.     ONETOUCH DELICA LANCETS 99991111 Misc  Use to check blood sugars once daily     pioglitazone 15 MG tablet  Commonly known as:  ACTOS  TAKE 1 TABLET BY MOUTH EVERY DAY     prednisoLONE acetate 1 % ophthalmic suspension  Commonly known as:  PRED FORTE  Place 4 drops into the right eye.     predniSONE 10 MG tablet  Commonly known as:  DELTASONE  TK 1 T PO FIVE TIMES DAILY     ULORIC 40 MG tablet  Generic drug:  febuxostat  40 mg.     valACYclovir 500 MG tablet  Commonly known as:  VALTREX  Take 500 mg by mouth 3 (three) times daily.     vitamin B-12 1000 MCG tablet  Commonly known as:  CYANOCOBALAMIN  Take 1,000 mcg by mouth daily.        Allergies:  Allergies  Allergen Reactions  . Pneumovax 23 [Pneumococcal Vac Polyvalent] Anaphylaxis    weakness  . Sulfa Antibiotics Hives    Past Medical History  Diagnosis Date  . Diabetes mellitus without complication ([place])     type 2  . GERD (gastroesophageal reflux disease)   . Hyperlipidemia   . Palpitations   . OSA on CPAP   . Anxiety   . TIA (transient ischemic attack)     remote history of TIA's early 2000  . Anemia     s/p Heme work -up normal EGD and colonscopy in 2012 per pt,neg SPEP  . HSV-1 (herpes simplex virus 1) infection     Acyclovir prn  . Joint pain     osteoarthritis by Xray- possible erosion of R 4th MCP,elevated uric  acid ,ANA +   . Morbid obesity ([place])   . Gout   . Osteopenia   . Pain  management     Neurosurg: Dr Clydell Hakim  . CKD (chronic kidney disease), stage III   . Aortic valve disorders   . Hypertension   . PAF (paroxysmal atrial fibrillation) ([place])   . Bilateral carotid artery stenosis 06/08/2015    1-39% bilateral    No past surgical history on file.  Family History  Problem Relation Age of Onset  . Arrhythmia Brother     Social History:  reports that she has never smoked. She has never used smokeless tobacco. She reports that she does not drink alcohol or use illicit drugs.  Review of Systems:   Hypertension:  blood pressure is  well controlled She is currently taking Zestoretic, low dose metoprolol and amlodipine 5 mg.   Lipids: Triglycerides have been high, now controlled on fenofibrate, LDL has been below 100 with Lipitor 40 mg   Lab Results  Component Value Date   CHOL 132 02/27/2015   HDL 37.80* 02/27/2015   LDLCALC 75 02/27/2015  LDLDIRECT 97.2 05/31/2013   TRIG 96.0 02/27/2015   CHOLHDL 3 02/27/2015    Renal insufficiency: Her creatinine is better now   Lab Results  Component Value Date   CREATININE 1.05 09/22/2015   She has hypothyroidism and is taking 137 g levothyroxine, recent TSH normal   Lab Results  Component Value Date   TSH 1.94 09/22/2015     Diabetic foot exam  as of 6/16 shows normal monofilament sensation in the toes and plantar surfaces, no skin lesions or ulcers on the feet and normal pedal pulses    Examination:   BP 124/62 mmHg  Pulse 65  Temp(Src) 97.5 F (36.4 C)  Resp 16  Ht 5\' 1"  (1.549 m)  Wt 230 lb (104.327 kg)  BMI 43.48 kg/m2  SpO2 98%  Body mass index is 43.48 kg/(m^2).     ASSESSMENT/ PLAN:   Diabetes type 2 with obesity :   See history of present illness for detailed discussion of his current management, blood sugar patterns and problems identified She is still having mostly high readings after supper even when she takes 10 units of Regular Insulin Also has some high readings  in the mornings waking up Her A1c is still relatively higher at 7.7  Most likely she is having tendency to high readings after meals and not clear if she has any high readings after breakfast when she does not take any regular insulin and is reluctant to do Tina Also can do better with timing of her insulin 30 minute before eating  Since she does not want to use brand name insulins she may do well with taking insulin with the V-go pump This was discussed in detail today She is willing to try this for a week and will get the insurance authorization checked  Meanwhile she will try to do 12 units of regular insulin before supper and take it 30 units before eating She can adjust the dose based on meal size and carbohydrate intake also Discussed that we need to know what her blood sugars are after her first meal and she needs to check these    Counseling time on subjects discussed above is over 50% of today's 25 minute visit  Patient Instructions  Check blood sugars on waking up 3  times a week Also check blood sugars about 2 hours after a meal and do this after different meals by rotation  Recommended blood sugar levels on waking up is 90-130 and about 2 hours after meal is 130-160  Please bring your blood sugar monitor to each visit, thank you  Take R insulin 30 min before meal   TAKE 12 UNITS R insulin and may take 10 for smaller meals  More sugars after lunch       Tina Hester 10/01/2015, 12:57 PM

## 2015-10-01 NOTE — Patient Instructions (Signed)
Check blood sugars on waking up 3  times a week Also check blood sugars about 2 hours after a meal and do this after different meals by rotation  Recommended blood sugar levels on waking up is 90-130 and about 2 hours after meal is 130-160  Please bring your blood sugar monitor to each visit, thank you  Take R insulin 30 min before meal   TAKE 12 UNITS R insulin and may take 10 for smaller meals  More sugars after lunch

## 2015-10-02 DIAGNOSIS — R197 Diarrhea, unspecified: Secondary | ICD-10-CM | POA: Diagnosis not present

## 2015-10-02 DIAGNOSIS — D649 Anemia, unspecified: Secondary | ICD-10-CM | POA: Diagnosis not present

## 2015-10-02 DIAGNOSIS — I350 Nonrheumatic aortic (valve) stenosis: Secondary | ICD-10-CM | POA: Diagnosis not present

## 2015-10-02 DIAGNOSIS — I10 Essential (primary) hypertension: Secondary | ICD-10-CM | POA: Diagnosis not present

## 2015-10-02 DIAGNOSIS — E1121 Type 2 diabetes mellitus with diabetic nephropathy: Secondary | ICD-10-CM | POA: Diagnosis not present

## 2015-10-02 DIAGNOSIS — E559 Vitamin D deficiency, unspecified: Secondary | ICD-10-CM | POA: Diagnosis not present

## 2015-10-02 DIAGNOSIS — N183 Chronic kidney disease, stage 3 (moderate): Secondary | ICD-10-CM | POA: Diagnosis not present

## 2015-10-02 DIAGNOSIS — E538 Deficiency of other specified B group vitamins: Secondary | ICD-10-CM | POA: Diagnosis not present

## 2015-10-02 DIAGNOSIS — Z0000 Encounter for general adult medical examination without abnormal findings: Secondary | ICD-10-CM | POA: Diagnosis not present

## 2015-10-02 DIAGNOSIS — E1165 Type 2 diabetes mellitus with hyperglycemia: Secondary | ICD-10-CM | POA: Diagnosis not present

## 2015-10-02 DIAGNOSIS — E039 Hypothyroidism, unspecified: Secondary | ICD-10-CM | POA: Diagnosis not present

## 2015-10-02 DIAGNOSIS — Z79899 Other long term (current) drug therapy: Secondary | ICD-10-CM | POA: Diagnosis not present

## 2015-10-08 ENCOUNTER — Telehealth: Payer: Self-pay | Admitting: Nutrition

## 2015-10-08 ENCOUNTER — Encounter: Payer: PPO | Attending: Endocrinology | Admitting: Nutrition

## 2015-10-08 DIAGNOSIS — Z794 Long term (current) use of insulin: Secondary | ICD-10-CM

## 2015-10-08 DIAGNOSIS — E1165 Type 2 diabetes mellitus with hyperglycemia: Secondary | ICD-10-CM | POA: Diagnosis not present

## 2015-10-08 DIAGNOSIS — IMO0002 Reserved for concepts with insufficient information to code with codable children: Secondary | ICD-10-CM

## 2015-10-08 DIAGNOSIS — Z713 Dietary counseling and surveillance: Secondary | ICD-10-CM | POA: Insufficient documentation

## 2015-10-08 DIAGNOSIS — E1121 Type 2 diabetes mellitus with diabetic nephropathy: Secondary | ICD-10-CM

## 2015-10-08 NOTE — Telephone Encounter (Signed)
20 unit basal.  One click for breakfast and lunch and 3 clicks for dinner

## 2015-10-08 NOTE — Telephone Encounter (Signed)
V-go start today:  Need doses.

## 2015-10-08 NOTE — Patient Instructions (Addendum)
Fill and apply one V-Go every morning with R insulin.   Give 1 button press 30 min. before breakfast and lunch, and 3 button presses 30 min. Before supper Test blood sugars before meals and at bedtime Stop all other insulins

## 2015-10-08 NOTE — Progress Notes (Signed)
This patient was instructed on how to fill, apply and use the V-go.  We did not have any R insulin here at the office, and the patient did not bring any insulin with her.   She said that she wanted to be trained on this, even though she has not heard back from her insurance company, for how much this will cost her.  She was given a V-go 20 starter kit and she filled 2 demo pens with water.   She will go home now and fill her first V-go with R insulin and attach it before she eats her breakfast.  She was reminded that she will not take any N insulin. Written instructions were given for taking 1 button press for breakfast and lunch, and 3 button presses 30 min. Before supper.  She re verbalized this dose as well as to no take any more N insulin.   She was also given a orange card that has a 24/7 telephone number to call if she has questions about filling and applying the V-Go. I phoned her to remind her of the need to test her blood sugars before meals and at bedtime.  I needed to leave a message on her home phone for this. She had no final questions.

## 2015-10-10 ENCOUNTER — Ambulatory Visit: Payer: PPO | Admitting: Endocrinology

## 2015-10-13 ENCOUNTER — Ambulatory Visit: Payer: PPO | Admitting: Endocrinology

## 2015-10-14 ENCOUNTER — Telehealth: Payer: Self-pay | Admitting: Nutrition

## 2015-10-14 NOTE — Telephone Encounter (Signed)
Patient called saying that she wore the V-go for 3 days, and she had very bad skin reactions from the tape.  She has since started back on her injections, and said that she had mistakenly not been taking her N insulin in the AM.  She has been doing that for the last 3 days, and her FBSs yesterday and today have been 100, and 110.  Her HS reading last night was 130.   She reports that she does not want to try some skin barrier wipes, or IV 3000.  She is happy with what she is doing.

## 2015-10-14 NOTE — Telephone Encounter (Signed)
Please reschedule patient for follow-up  For about 4 weeks with labs

## 2015-10-20 DIAGNOSIS — H40013 Open angle with borderline findings, low risk, bilateral: Secondary | ICD-10-CM | POA: Diagnosis not present

## 2015-10-20 DIAGNOSIS — E119 Type 2 diabetes mellitus without complications: Secondary | ICD-10-CM | POA: Diagnosis not present

## 2015-10-20 DIAGNOSIS — T86841 Corneal transplant failure: Secondary | ICD-10-CM | POA: Diagnosis not present

## 2015-10-20 DIAGNOSIS — H5451 Low vision, right eye, normal vision left eye: Secondary | ICD-10-CM | POA: Diagnosis not present

## 2015-10-24 ENCOUNTER — Other Ambulatory Visit: Payer: Self-pay

## 2015-10-24 ENCOUNTER — Other Ambulatory Visit: Payer: Self-pay | Admitting: *Deleted

## 2015-10-24 ENCOUNTER — Telehealth: Payer: Self-pay | Admitting: Cardiology

## 2015-10-24 MED ORDER — APIXABAN 5 MG PO TABS: 5.0000 mg | ORAL_TABLET | Freq: Two times a day (BID) | ORAL | Status: DC

## 2015-10-24 MED ORDER — APIXABAN 5 MG PO TABS
5.0000 mg | ORAL_TABLET | Freq: Two times a day (BID) | ORAL | Status: DC
Start: 2015-10-24 — End: 2016-05-07

## 2015-10-24 NOTE — Telephone Encounter (Signed)
Pt informed  Medication Detail      Disp Refills Start End     apixaban (ELIQUIS) 5 MG TABS tablet 30 tablet 6 10/24/2015     Sig - Route: Take 1 tablet (5 mg total) by mouth 2 (two) times daily. - Oral    E-Prescribing Status: Receipt confirmed by pharmacy (10/24/2015 11:42 AM EST)     Pharmacy    [id number] - [place], [place] - [address]

## 2015-10-24 NOTE — Telephone Encounter (Signed)
New Message  Pt requests a call back to ensure that Eliquis refill has been sent in. Please call back and discuss.   Pt was made appt  W. Dr Radford Pax 12/15/15/   *STAT* If patient is at the pharmacy, call can be transferred to refill team.   1. Which medications need to be refilled? (please list name of each medication and dose if known) Eliquis 5  2. Which pharmacy/location (including street and city if local pharmacy) is medication to be sent to?[address]  3. Do they need a 30 day or 90 day supply? [place]

## 2015-11-11 ENCOUNTER — Telehealth: Payer: Self-pay | Admitting: Cardiology

## 2015-11-11 NOTE — Telephone Encounter (Signed)
New message      Please fax sleep study to [phone] so that they can give the pt sleep supplies.

## 2015-11-12 ENCOUNTER — Other Ambulatory Visit: Payer: PPO

## 2015-11-12 ENCOUNTER — Encounter: Payer: Self-pay | Admitting: Endocrinology

## 2015-11-12 ENCOUNTER — Ambulatory Visit (INDEPENDENT_AMBULATORY_CARE_PROVIDER_SITE_OTHER): Payer: PPO | Admitting: Endocrinology

## 2015-11-12 ENCOUNTER — Other Ambulatory Visit: Payer: Self-pay | Admitting: *Deleted

## 2015-11-12 VITALS — BP 130/70 | HR 60 | Temp 97.8°F | Resp 16 | Ht 61.0 in | Wt 237.8 lb

## 2015-11-12 DIAGNOSIS — Z794 Long term (current) use of insulin: Secondary | ICD-10-CM | POA: Diagnosis not present

## 2015-11-12 DIAGNOSIS — E1165 Type 2 diabetes mellitus with hyperglycemia: Secondary | ICD-10-CM | POA: Diagnosis not present

## 2015-11-12 MED ORDER — GLUCOSE BLOOD VI STRP: ORAL_STRIP | Status: DC

## 2015-11-12 NOTE — Progress Notes (Signed)
Patient ID: Tina Hester, female   DOB: [birth year], 75 y.o.   MRN: [id number]   Reason for Appointment: Diabetes follow-up   History of Present Illness   Diagnosis: Type 2 DIABETES MELITUS, long-standing    Previous history:  She has been on insulin for a few years. Because of cost she has been switched from Lantus to NPH insulin twice a day She had previously been taking metformin but this was stopped when she had renal insufficiency Overall her blood sugars have been difficult to control and she has previously had relatively poor control. However with adding Actos her blood sugars had started improving significantly Also with her being very consistent with diet in 2015 her A1c had gone down to 6.4  Recent history:    Insulin regimen: Humulin N 20-25 bid.    Regular Insulin 5-10 units at suppertime   Oral hypoglycemic drugs: Actos, Metformin 1500 mg    Her blood sugars have been overall inadequately controlled with last A1c 7.6 To improve her control she was given a trial of the V-go pump in 2/17 but she had allergic skin reaction with the adhesive and stopped taking this but did not notify us  She has on her own started taking NPH twice a day subsequently Still continues to take regular insulin at suppertime  Current blood sugar patterns, management and problems identified:  She has overall better blood sugar control   Her fasting readings are generally fairly stable near normal but has had 1 episode of hypoglycemia at 4:30 AM in February  Has not done many readings in the mornings lately  She thinks her blood sugar is not low during the afternoon even with taking NPH before her first meal which is late in the morning usually  She does not check readings AFTER her first meal  She does try to adjust her SUPPERTIME dose based on her carbohydrate intake between 5-10 units.  Last night had only some corn with her meals for carbohydrate and took 5  units  Occasionally sugars maybe higher after supper when she is eating out and last weekend she forgot her suppertime dose and glucose was 305  She will sometimes have a large snack at bedtime if her blood sugar is low normal  She thinks she is trying to be a little more active with taking care of her grandchildren  She is trying to walk with her dogs but not as much recently with cold weather and has gained weight                   Monitors blood glucose: occasionally     Glucometer:  FreeStyle       Blood Glucose readings from download:   Mean values apply above for all meters except median for One Touch  PRE-MEAL Fasting Lunch Dinner Bedtime Overall  Glucose range: 79-123   144, 99  89-300    Mean/median: 105   155  133+/-46     Meals: 2-3 meals per day. Dinner around 7 pm  Am: usually egg and toast at 11 am       Physical activity: exercise: walking 15-20 min at times     Wt Readings from Last 3 Encounters:  11/12/15 237 lb 12.8 oz (107.865 kg)  10/01/15 230 lb (104.327 kg)  08/14/15 231 lb 12.8 oz (105.144 kg)   LABS:  Lab Results  Component Value Date   HGBA1C 7.7* 09/22/2015   HGBA1C 7.5* 07/03/2015  HGBA1C 7.5* 02/27/2015   Lab Results  Component Value Date   MICROALBUR 6.8* 09/22/2015   LDLCALC 75 02/27/2015   CREATININE 1.05 09/22/2015   No visits with results within 1 Week(s) from this visit. Latest known visit with results is:  Lab on 09/22/2015  Component Date Value Ref Range Status  . Hgb A1c MFr Bld 09/22/2015 7.7* 4.6 - 6.5 % Final   Glycemic Control Guidelines for People with Diabetes:Non Diabetic:  <6%Goal of Therapy: <7%Additional Action Suggested:  >8%   . Sodium 09/22/2015 139  135 - 145 mEq/L Final  . Potassium 09/22/2015 4.6  3.5 - 5.1 mEq/L Final  . Chloride 09/22/2015 105  96 - 112 mEq/L Final  . CO2 09/22/2015 25  19 - 32 mEq/L Final  . Glucose, Bld 09/22/2015 155* 70 - 99 mg/dL Final  . BUN 09/22/2015 28* 6 - 23 mg/dL Final  .  Creatinine, Ser 09/22/2015 1.05  0.40 - 1.20 mg/dL Final  . Total Bilirubin 09/22/2015 0.5  0.2 - 1.2 mg/dL Final  . Alkaline Phosphatase 09/22/2015 68  39 - 117 U/L Final  . AST 09/22/2015 15  0 - 37 U/L Final  . ALT 09/22/2015 15  0 - 35 U/L Final  . Total Protein 09/22/2015 6.7  6.0 - 8.3 g/dL Final  . Albumin 09/22/2015 3.6  3.5 - 5.2 g/dL Final  . Calcium 09/22/2015 9.0  8.4 - 10.5 mg/dL Final  . GFR 09/22/2015 54.35* >60.00 mL/min Final  . TSH 09/22/2015 1.94  0.35 - 4.50 uIU/mL Final  . Microalb, Ur 09/22/2015 6.8* 0.0 - 1.9 mg/dL Final  . Creatinine,U 09/22/2015 87.4   Final  . Microalb Creat Ratio 09/22/2015 7.8  0.0 - 30.0 mg/g Final       Medication List       This list is accurate as of: 11/12/15 10:53 AM.  Always use your most recent med list.               acyclovir 400 MG tablet  Commonly known as:  ZOVIRAX     ALPRAZolam 0.25 MG tablet  Commonly known as:  XANAX  Take 0.25 mg by mouth at bedtime as needed for anxiety.     amLODipine 5 MG tablet  Commonly known as:  NORVASC  TAKE 1 TABLET BY MOUTH EVERY DAY     apixaban 5 MG Tabs tablet  Commonly known as:  ELIQUIS  Take 1 tablet (5 mg total) by mouth 2 (two) times daily.     atorvastatin 40 MG tablet  Commonly known as:  LIPITOR  Take 1 tablet (40 mg total) by mouth daily at 6 PM.     atropine 1 % ophthalmic solution  INSTILL 1 DROP INTO THE RIGHT EYE DAILY     bacitracin ophthalmic ointment  APPLY IN RIGHT EYE AT BEDTIME     CALCIUM PO  Take by mouth.     COLCRYS 0.6 MG tablet  Generic drug:  colchicine  as needed.     fenofibrate 145 MG tablet  Commonly known as:  TRICOR  Take 145 mg by mouth. TAKING 3 TIMES A WEEK     gabapentin 100 MG capsule  Commonly known as:  NEURONTIN  Take 100 mg by mouth daily.     glucose blood test strip  Commonly known as:  FREESTYLE LITE  Use to test blood sugar twice daily. Dx: E11.9     insulin NPH Human 100 UNIT/ML injection  Commonly known as:  NOVOLIN N  Inject 20 units every morning and 45 units every night     insulin regular 100 units/mL injection  Commonly known as:  NOVOLIN R RELION  Inject 0.06-0.1 mLs (6-10 Units total) into the skin 3 (three) times daily before meals.     INSULIN SYRINGE .5CC/30GX5/16" 30G X 5/16" 0.5 ML Misc  Use 2 needles per day to inject insulin     levothyroxine 137 MCG tablet  Commonly known as:  SYNTHROID, LEVOTHROID  Take 137 mcg by mouth daily before breakfast.     lisinopril-hydrochlorothiazide 20-25 MG tablet  Commonly known as:  PRINZIDE,ZESTORETIC  Take 1 tablet by mouth daily.     magnesium 30 MG tablet  Take 30 mg by mouth 2 (two) times daily.     metFORMIN 500 MG tablet  Commonly known as:  GLUCOPHAGE  Take 1 tablet every morning and 2 tablets every evening     metoprolol succinate 25 MG 24 hr tablet  Commonly known as:  TOPROL-XL  TAKE 1 TABLET BY MOUTH TWICE DAILY     multivitamin capsule  Take 1 capsule by mouth daily.     NEEDLE (DISP) 30 G 30G X 1/2" Misc  Commonly known as:  BD DISP NEEDLES  1 each by Does not apply route 2 (two) times daily before a meal.     ONETOUCH DELICA LANCETS 99991111 Misc  Use to check blood sugars once daily     pioglitazone 15 MG tablet  Commonly known as:  ACTOS  TAKE 1 TABLET BY MOUTH EVERY DAY     prednisoLONE acetate 1 % ophthalmic suspension  Commonly known as:  PRED FORTE  Place 4 drops into the right eye.     predniSONE 10 MG tablet  Commonly known as:  DELTASONE  TK 1 T PO FIVE TIMES DAILY     timolol 0.5 % ophthalmic solution  Commonly known as:  TIMOPTIC  USE 1 DROP IN RIGHT EYE D     ULORIC 40 MG tablet  Generic drug:  febuxostat  40 mg.     valACYclovir 500 MG tablet  Commonly known as:  VALTREX  Take 500 mg by mouth 3 (three) times daily.     vitamin B-12 1000 MCG tablet  Commonly known as:  CYANOCOBALAMIN  Take 1,000 mcg by mouth daily.        Allergies:  Allergies  Allergen Reactions  . Pneumovax 23  [Pneumococcal Vac Polyvalent] Anaphylaxis    weakness  . Sulfa Antibiotics Hives    Past Medical History  Diagnosis Date  . Diabetes mellitus without complication ([place])     type 2  . GERD (gastroesophageal reflux disease)   . Hyperlipidemia   . Palpitations   . OSA on CPAP   . Anxiety   . TIA (transient ischemic attack)     remote history of TIA's early 2000  . Anemia     s/p Heme work -up normal EGD and colonscopy in 2012 per pt,neg SPEP  . HSV-1 (herpes simplex virus 1) infection     Acyclovir prn  . Joint pain     osteoarthritis by Xray- possible erosion of R 4th MCP,elevated uric  acid ,ANA +   . Morbid obesity ([place])   . Gout   . Osteopenia   . Pain management     Neurosurg: Dr Clydell Hakim  . CKD (chronic kidney disease), stage III   . Aortic valve disorders   . Hypertension   . PAF (paroxysmal  atrial fibrillation) ([place])   . Bilateral carotid artery stenosis 06/08/2015    1-39% bilateral    No past surgical history on file.  Family History  Problem Relation Age of Onset  . Arrhythmia Brother     Social History:  reports that she has never smoked. She has never used smokeless tobacco. She reports that she does not drink alcohol or use illicit drugs.  Review of Systems:   Hypertension:  blood pressure is  controlled She is currently taking Zestoretic, low dose metoprolol and amlodipine 5 mg.   Lipids: Triglycerides have been high Previously, now controlled on fenofibrate, LDL has been below 100 with Lipitor 40 mg   Lab Results  Component Value Date   CHOL 132 02/27/2015   HDL 37.80* 02/27/2015   LDLCALC 75 02/27/2015   LDLDIRECT 97.2 05/31/2013   TRIG 96.0 02/27/2015   CHOLHDL 3 02/27/2015    Renal insufficiency: Her creatinine is More consistently normal recently   Lab Results  Component Value Date   CREATININE 1.05 09/22/2015   She has hypothyroidism and is taking 137 g levothyroxine, recent TSH normal   Lab Results  Component Value Date     TSH 1.94 09/22/2015    She may take gabapentin low-dose for mild neuropathic symptoms  Diabetic foot exam  as of 6/16 shows normal monofilament sensation in the toes and plantar surfaces, no skin lesions or ulcers on the feet and normal pedal pulses    Examination:   BP 130/70 mmHg  Pulse 60  Temp(Src) 97.8 F (36.6 C)  Resp 16  Ht 5\' 1"  (1.549 m)  Wt 237 lb 12.8 oz (107.865 kg)  BMI 44.95 kg/m2  SpO2 98%  Body mass index is 44.95 kg/(m^2).     ASSESSMENT/ PLAN:   Diabetes type 2 with obesity :   See history of present illness for detailed discussion of his current management, blood sugar patterns and problems identified She failed a trial of the V-go pump because of skin allergy  Surprisingly her blood sugars are much better now with less variability with taking her own regimen of NPH twice a day along with regular insulin before supper Previously has been difficult to adjust her insulin because of her checking blood sugars only fasting and bedtime but may have been having higher readings in the afternoons No recent A1c available  She is arbitrarily adjusting her NPH based on blood sugar level at the time of injection and advised her not to do so She can try to take a consistent dose of 20 units twice a day with the first dose an hour before eating Currently able to adjust her suppertime coverage fairly well with 5-10 units based on meal size and also compliant with taking the insulin 15-30 minutes before eating Advised her to look at total carbohydrate content also been adjusting her insulin  Check A1c on the next visit Encouraged her to be walking regularly now She does need to check more readings after her first meal, discussed timing and targets of blood sugars    Counseling time on subjects discussed above is over 50% of today's 25 minute visit  Patient Instructions  Take 20 units twice daily, am dose 1 hr before the meal  Check blood sugars on waking up 4-5  times a week Also check blood sugars about 2 hours after a meal and do this after different meals by rotation  Recommended blood sugar levels on waking up is 90-130 and about 2 hours after  meal is 130-160  Please bring your blood sugar monitor to each visit, thank you      [facility] 11/12/2015, 10:53 AM

## 2015-11-12 NOTE — Patient Instructions (Addendum)
Take 20 units twice daily, am dose 1 hr before the meal  Check blood sugars on waking up 4-5 times a week Also check blood sugars about 2 hours after a meal and do this after different meals by rotation  Recommended blood sugar levels on waking up is 90-130 and about 2 hours after meal is 130-160  Please bring your blood sugar monitor to each visit, thank you

## 2015-11-13 NOTE — Telephone Encounter (Signed)
Spoke with [facility] and they stated that they do not have a sleep study on file for this patient.    Patient stated that she had it done in [place].  Will work with patient to see if we can get a copy of the original PSG

## 2015-11-19 ENCOUNTER — Telehealth: Payer: Self-pay | Admitting: Cardiology

## 2015-11-19 DIAGNOSIS — G4733 Obstructive sleep apnea (adult) (pediatric): Secondary | ICD-10-CM

## 2015-11-19 NOTE — Telephone Encounter (Signed)
New Message  Pt called about CPAP. Believes she may have another sleep study completed request a call back to discuss who they would recommend.

## 2015-11-20 NOTE — Telephone Encounter (Signed)
Left message on patients phone with details of sleep study steps.   [facility] does not have an original PSG so the patient will need to complete this before she can get anything from [facility].    Left patient the message that once I have approval from insurance I will send her information to the sleep lab for scheduling. Then she will receive a packet of information from the sleep lab letting her know when she is to go.  Once we have the PSG/Titration I will send it to [facility] for her to receive her supplies.

## 2015-11-20 NOTE — Addendum Note (Signed)
Addended by: Andres Ege on: 11/20/2015 02:56 PM   Modules accepted: Orders

## 2015-12-04 ENCOUNTER — Telehealth: Payer: Self-pay | Admitting: Cardiology

## 2015-12-04 NOTE — Telephone Encounter (Signed)
Printed and faxed to Dr. Lindley Magnus at [phone].

## 2015-12-04 NOTE — Telephone Encounter (Signed)
Request for surgical clearance:  1. What type of surgery is being performed? Oral surgery   2. When is this surgery scheduled? Next week   3. Are there any medications that need to be held prior to surgery and how long? Blood thinner  4. Name of physician performing surgery? Sundra Aland  5. What is your office phone and fax number?  Did not give fax wanted to speak w/ rN  6.

## 2015-12-04 NOTE — Telephone Encounter (Signed)
Dr. Buena Irish, DDS called to request medication instructions for oral surgery. Patient will have 1 tooth extraction with no sedation to be scheduled next week.  Med instructions to be faxed to: [phone].  To Dr. Radford Pax.

## 2015-12-04 NOTE — Telephone Encounter (Signed)
OK to hold Eliquis for 48 hours prior to tooth extraction.

## 2015-12-15 ENCOUNTER — Encounter: Payer: Self-pay | Admitting: Cardiology

## 2015-12-15 ENCOUNTER — Ambulatory Visit (INDEPENDENT_AMBULATORY_CARE_PROVIDER_SITE_OTHER): Payer: PPO | Admitting: Cardiology

## 2015-12-15 VITALS — BP 138/62 | HR 66 | Ht 60.0 in | Wt 234.5 lb

## 2015-12-15 DIAGNOSIS — E669 Obesity, unspecified: Secondary | ICD-10-CM

## 2015-12-15 DIAGNOSIS — I10 Essential (primary) hypertension: Secondary | ICD-10-CM | POA: Diagnosis not present

## 2015-12-15 DIAGNOSIS — I480 Paroxysmal atrial fibrillation: Secondary | ICD-10-CM

## 2015-12-15 DIAGNOSIS — I359 Nonrheumatic aortic valve disorder, unspecified: Secondary | ICD-10-CM

## 2015-12-15 DIAGNOSIS — I272 Other secondary pulmonary hypertension: Secondary | ICD-10-CM

## 2015-12-15 DIAGNOSIS — I6523 Occlusion and stenosis of bilateral carotid arteries: Secondary | ICD-10-CM

## 2015-12-15 DIAGNOSIS — Z9989 Dependence on other enabling machines and devices: Principal | ICD-10-CM

## 2015-12-15 DIAGNOSIS — G4733 Obstructive sleep apnea (adult) (pediatric): Secondary | ICD-10-CM

## 2015-12-15 NOTE — Patient Instructions (Signed)
Medication Instructions:  Your physician recommends that you continue on your current medications as directed. Please refer to the Current Medication list given to you today.   Labwork: None  Testing/Procedures: Your physician has requested that you have an echocardiogram in October, 2017. Echocardiography is a painless test that uses sound waves to create images of your heart. It provides your doctor with information about the size and shape of your heart and how well your heart's chambers and valves are working. This procedure takes approximately one hour. There are no restrictions for this procedure.  Follow-Up: Your physician wants you to follow-up in: 6 months with a PA or NP. You will receive a reminder letter in the mail two months in advance. If you don't receive a letter, please call our office to schedule the follow-up appointment.   Your physician wants you to follow-up in: 1 year with Dr. Radford Pax. You will receive a reminder letter in the mail two months in advance. If you don't receive a letter, please call our office to schedule the follow-up appointment.   Any Other Special Instructions Will Be Listed Below (If Applicable).     If you need a refill on your cardiac medications before your next appointment, please call your pharmacy.

## 2015-12-15 NOTE — Progress Notes (Signed)
Cardiology Office Note    Date:  12/15/2015   ID:  Tina Hester, DOB Sep 14, 1940, MRN [id number]  PCP:  Lilian Coma, MD  Cardiologist:  Sueanne Margarita, MD   Chief Complaint  Patient presents with  . Sleep Apnea  . Atrial Fibrillation    History of Present Illness:  Tina Hester is a 75 y.o. female with a history of PAF, HTN, bilateral carotid artery stenosis and OSA on CPAP presents today for followup. She is doing well. She denies any chest pain, SOB, DOE, LE edema, palpitations or syncope. She tolerates her CPAP well. She feels the pressure is adequate and has no problems with the nasal face mask. She sometimes feels rested in the am if she sleeps well the night before.  She does have some daytime sleepiness and occasionally naps during the day.       Past Medical History  Diagnosis Date  . Diabetes mellitus without complication ([place])     type 2  . GERD (gastroesophageal reflux disease)   . Hyperlipidemia   . OSA on CPAP   . Anxiety   . TIA (transient ischemic attack)     remote history of TIA's early 2000  . Anemia     s/p Heme work -up normal EGD and colonscopy in 2012 per pt,neg SPEP  . HSV-1 (herpes simplex virus 1) infection     Acyclovir prn  . Joint pain     osteoarthritis by Xray- possible erosion of R 4th MCP,elevated uric  acid ,ANA +   . Morbid obesity ([place])   . Gout   . Osteopenia   . Pain management     Neurosurg: Dr Clydell Hakim  . CKD (chronic kidney disease), stage III   . Aortic stenosis     mild to moderate by echo 2016  . Hypertension   . PAF (paroxysmal atrial fibrillation) ([place])   . Bilateral carotid artery stenosis 06/08/2015    1-39% bilateral  . Pulmonary HTN ([place])     Moderate by echo  2016. PASP 105mmHg    No past surgical history on file.  Current Medications: Outpatient Prescriptions Prior to Visit  Medication Sig Dispense Refill  . acyclovir (ZOVIRAX) 400 MG tablet     . ALPRAZolam (XANAX) 0.25 MG tablet  Take 0.25 mg by mouth at bedtime as needed for anxiety.    Marland Kitchen amLODipine (NORVASC) 5 MG tablet TAKE 1 TABLET BY MOUTH EVERY DAY 30 tablet 11  . apixaban (ELIQUIS) 5 MG TABS tablet Take 1 tablet (5 mg total) by mouth 2 (two) times daily. 60 tablet 6  . atorvastatin (LIPITOR) 40 MG tablet Take 1 tablet (40 mg total) by mouth daily at 6 PM. 90 tablet 3  . atropine 1 % ophthalmic solution INSTILL 1 DROP INTO THE RIGHT EYE DAILY  0  . bacitracin ophthalmic ointment APPLY IN RIGHT EYE AT BEDTIME  1  . CALCIUM PO Take 1 tablet by mouth daily.     Marland Kitchen COLCRYS 0.6 MG tablet Take 0.6 mg by mouth daily as needed (gout).     . fenofibrate (TRICOR) 145 MG tablet Take 145 mg by mouth. TAKING 3 TIMES A WEEK    . gabapentin (NEURONTIN) 100 MG capsule Take 100 mg by mouth daily.    Marland Kitchen glucose blood (FREESTYLE LITE) test strip Use to test blood sugar twice daily. Dx: E11.9 200 each 1  . insulin NPH Human (NOVOLIN N) 100 UNIT/ML injection Inject 20 units every morning and  45 units every night (Patient taking differently: Inject 20-25 units twice a day) 20 mL 3  . insulin regular (NOVOLIN R RELION) 100 units/mL injection Inject 0.06-0.1 mLs (6-10 Units total) into the skin 3 (three) times daily before meals. (Patient not taking: Reported on 11/12/2015) 10 mL 1  . Insulin Syringe-Needle U-100 (INSULIN SYRINGE .5CC/30GX5/16") 30G X 5/16" 0.5 ML MISC Use 2 needles per day to inject insulin 60 each 5  . levothyroxine (SYNTHROID, LEVOTHROID) 137 MCG tablet Take 137 mcg by mouth daily before breakfast.     . lisinopril-hydrochlorothiazide (PRINZIDE,ZESTORETIC) 20-25 MG per tablet Take 1 tablet by mouth daily. 90 tablet 3  . magnesium 30 MG tablet Take 30 mg by mouth 2 (two) times daily.    . metFORMIN (GLUCOPHAGE) 500 MG tablet Take 1 tablet every morning and 2 tablets every evening 270 tablet 1  . metoprolol succinate (TOPROL-XL) 25 MG 24 hr tablet TAKE 1 TABLET BY MOUTH TWICE DAILY 180 tablet 1  . Multiple Vitamin  (MULTIVITAMIN) capsule Take 1 capsule by mouth daily.    Marland Kitchen NEEDLE, DISP, 30 G (BD DISP NEEDLES) 30G X 1/2" MISC 1 each by Does not apply route 2 (two) times daily before a meal. 100 each 5  . ONETOUCH DELICA LANCETS 99991111 MISC Use to check blood sugars once daily 50 each 3  . pioglitazone (ACTOS) 15 MG tablet TAKE 1 TABLET BY MOUTH EVERY DAY 90 tablet 1  . prednisoLONE acetate (PRED FORTE) 1 % ophthalmic suspension Place 4 drops into the right eye.     . predniSONE (DELTASONE) 10 MG tablet TK 1 T PO FIVE TIMES DAILY  1  . timolol (TIMOPTIC) 0.5 % ophthalmic solution USE 1 DROP IN RIGHT EYE D  1  . ULORIC 40 MG tablet 40 mg.    . valACYclovir (VALTREX) 500 MG tablet Take 500 mg by mouth 3 (three) times daily.    . vitamin B-12 (CYANOCOBALAMIN) 1000 MCG tablet Take 1,000 mcg by mouth daily.     No facility-administered medications prior to visit.     Allergies:   Pneumovax 23 and Sulfa antibiotics   Social History   Social History  . Marital Status: Unknown    Spouse Name: N/A  . Number of Children: N/A  . Years of Education: N/A   Social History Main Topics  . Smoking status: Never Smoker   . Smokeless tobacco: Never Used  . Alcohol Use: No  . Drug Use: No  . Sexual Activity: Not Asked   Other Topics Concern  . None   Social History Narrative     Family History:  The patient's family history includes Arrhythmia in her brother.   ROS:   Please see the history of present illness.    Review of Systems  Constitution: Negative.  HENT: Negative.   Eyes: Negative.   Cardiovascular: Negative.   Respiratory: Negative.   Skin: Negative.   Musculoskeletal: Negative.   Gastrointestinal: Negative.   Genitourinary: Negative.   Neurological: Negative.   Psychiatric/Behavioral: The patient is nervous/anxious.    All other systems reviewed and are negative.   PHYSICAL EXAM:   VS:  BP 138/62 mmHg  Pulse 66  Ht 5' (1.524 m)  Wt 234 lb 8 oz (106.369 kg)  BMI 45.80 kg/m2   GEN:  Well nourished, well developed, in no acute distress HEENT: normal Neck: no JVD, carotid bruits, or masses Cardiac: RRR; no murmurs, rubs, or gallops,no edema.  Intact distal pulses bilaterally.  Respiratory:  clear to auscultation bilaterally, normal work of breathing GI: soft, nontender, nondistended, + BS MS: no deformity or atrophy Skin: warm and dry, no rash Neuro:  Alert and Oriented x 3, Strength and sensation are intact Psych: euthymic mood, full affect  Wt Readings from Last 3 Encounters:  12/15/15 234 lb 8 oz (106.369 kg)  11/12/15 237 lb 12.8 oz (107.865 kg)  10/01/15 230 lb (104.327 kg)      Studies/Labs Reviewed:   EKG:  EKG is not ordered today.    Recent Labs: 04/09/2015: Hemoglobin 12.2; Platelets 212 09/22/2015: ALT 15; BUN 28*; Creatinine, Ser 1.05; Potassium 4.6; Sodium 139; TSH 1.94   Lipid Panel    Component Value Date/Time   CHOL 132 02/27/2015 0942   TRIG 96.0 02/27/2015 0942   HDL 37.80* 02/27/2015 0942   CHOLHDL 3 02/27/2015 0942   VLDL 19.2 02/27/2015 0942   LDLCALC 75 02/27/2015 0942   LDLDIRECT 97.2 05/31/2013 0942    Additional studies/ records that were reviewed today include:  CPAP d/l    ASSESSMENT:    1. OSA on CPAP   2. Essential hypertension   3. Obesity   4. PAF (paroxysmal atrial fibrillation) ([place])   5. Bilateral carotid artery stenosis   6. Pulmonary HTN ([place])   7. Aortic valve disorder      PLAN:  In order of problems listed above:  1. OSA doing well and tolerating CPAP. I will get a download on her device. 2. HTN - her BP is controlled today.  Continue amlodipine/prinizide/BB 3. Obesity 4. PAF maintaining NSR on BB.  Continue BB and Apixaban. Check CBC.  BMET stable.   5. Bilateral carotid artery stenosis - No ASA due to apixaban.  Continue statin.  Check FLP and ALT. Repeat dopplers 05/2017 6. Pulmonary HTN secondary to OSA/obesity 7. Mild to moderate AS - repeat echo 05/2016    Medication Adjustments/Labs and  Tests Ordered: Current medicines are reviewed at length with the patient today.  Concerns regarding medicines are outlined above.  Medication changes, Labs and Tests ordered today are listed in the Patient Instructions below. There are no Patient Instructions on file for this visit.   Lurena Nida, MD  12/15/2015 4:12 PM    [facility] [address], [place], [place]  [address] Phone: [phone]; Fax: [phone]

## 2015-12-22 DIAGNOSIS — G4730 Sleep apnea, unspecified: Secondary | ICD-10-CM | POA: Diagnosis not present

## 2015-12-22 DIAGNOSIS — Z947 Corneal transplant status: Secondary | ICD-10-CM | POA: Diagnosis not present

## 2015-12-22 DIAGNOSIS — T86840 Corneal transplant rejection: Secondary | ICD-10-CM | POA: Diagnosis not present

## 2015-12-22 DIAGNOSIS — E113492 Type 2 diabetes mellitus with severe nonproliferative diabetic retinopathy without macular edema, left eye: Secondary | ICD-10-CM | POA: Diagnosis not present

## 2015-12-23 ENCOUNTER — Other Ambulatory Visit: Payer: Self-pay | Admitting: Endocrinology

## 2015-12-25 ENCOUNTER — Encounter: Payer: Self-pay | Admitting: Cardiology

## 2015-12-26 ENCOUNTER — Encounter: Payer: Self-pay | Admitting: Cardiology

## 2016-01-02 DIAGNOSIS — G894 Chronic pain syndrome: Secondary | ICD-10-CM | POA: Diagnosis not present

## 2016-01-02 DIAGNOSIS — M542 Cervicalgia: Secondary | ICD-10-CM | POA: Diagnosis not present

## 2016-01-02 DIAGNOSIS — M5136 Other intervertebral disc degeneration, lumbar region: Secondary | ICD-10-CM | POA: Diagnosis not present

## 2016-01-02 DIAGNOSIS — M50322 Other cervical disc degeneration at C5-C6 level: Secondary | ICD-10-CM | POA: Diagnosis not present

## 2016-01-07 ENCOUNTER — Other Ambulatory Visit (INDEPENDENT_AMBULATORY_CARE_PROVIDER_SITE_OTHER): Payer: PPO

## 2016-01-07 DIAGNOSIS — Z794 Long term (current) use of insulin: Secondary | ICD-10-CM | POA: Diagnosis not present

## 2016-01-07 DIAGNOSIS — E1165 Type 2 diabetes mellitus with hyperglycemia: Secondary | ICD-10-CM

## 2016-01-07 LAB — COMPREHENSIVE METABOLIC PANEL
ALT: 16 U/L (ref 0–35)
AST: 17 U/L (ref 0–37)
Albumin: 3.9 g/dL (ref 3.5–5.2)
Alkaline Phosphatase: 66 U/L (ref 39–117)
BILIRUBIN TOTAL: 0.5 mg/dL (ref 0.2–1.2)
BUN: 32 mg/dL — ABNORMAL HIGH (ref 6–23)
CHLORIDE: 105 meq/L (ref 96–112)
CO2: 25 meq/L (ref 19–32)
CREATININE: 1.1 mg/dL (ref 0.40–1.20)
Calcium: 9.4 mg/dL (ref 8.4–10.5)
GFR: 51.47 mL/min — ABNORMAL LOW (ref >60.00)
GLUCOSE: 165 mg/dL — AB (ref 70–99)
Potassium: 4.1 mEq/L (ref 3.5–5.1)
SODIUM: 140 meq/L (ref 135–145)
Total Protein: 7.2 g/dL (ref 6.0–8.3)

## 2016-01-07 LAB — LIPID PANEL
CHOL/HDL RATIO: 5
Cholesterol: 152 mg/dL (ref 0–200)
HDL: 32.2 mg/dL — ABNORMAL LOW (ref >39.00)
LDL CALC: 84 mg/dL (ref 0–99)
NONHDL: 119.3
TRIGLYCERIDES: 175 mg/dL — AB (ref 0.0–149.0)
VLDL: 35 mg/dL (ref 0.0–40.0)

## 2016-01-07 LAB — HEMOGLOBIN A1C: HEMOGLOBIN A1C: 7.6 % — AB (ref 4.6–6.5)

## 2016-01-14 ENCOUNTER — Ambulatory Visit (INDEPENDENT_AMBULATORY_CARE_PROVIDER_SITE_OTHER): Payer: PPO | Admitting: Endocrinology

## 2016-01-14 ENCOUNTER — Encounter: Payer: Self-pay | Admitting: Endocrinology

## 2016-01-14 VITALS — BP 144/84 | HR 84 | Temp 97.7°F | Resp 16 | Ht 60.0 in | Wt 233.8 lb

## 2016-01-14 DIAGNOSIS — E782 Mixed hyperlipidemia: Secondary | ICD-10-CM

## 2016-01-14 DIAGNOSIS — Z794 Long term (current) use of insulin: Secondary | ICD-10-CM

## 2016-01-14 DIAGNOSIS — E1165 Type 2 diabetes mellitus with hyperglycemia: Secondary | ICD-10-CM

## 2016-01-14 NOTE — Patient Instructions (Signed)
Take 5-8 Regular insulin at all meals when getting any Carbohydrate  Check blood sugars on waking up 3-4  times a week Also check blood sugars about 2 hours after a meal and do this after different meals by rotation  Recommended blood sugar levels on waking up is 90-130 and about 2 hours after meal is 130-160  Please bring your blood sugar monitor to each visit, thank you  N insulin to be taken before breakfast

## 2016-01-14 NOTE — Progress Notes (Signed)
Patient ID: Tina Hester, female   DOB: 06-12-1941, 75 y.o.   MRN: [id number]   Reason for Appointment: Diabetes follow-up   History of Present Illness   Diagnosis: Type 2 DIABETES MELITUS, long-standing    Previous history:  She has been on insulin for a few years. Because of cost she has been switched from Lantus to NPH insulin twice a day She had previously been taking metformin but this was stopped when she had renal insufficiency Overall her blood sugars have been difficult to control and she has previously had relatively poor control. However with adding Actos her blood sugars had started improving significantly Also with her being very consistent with diet in 2015 her A1c had gone down to 6.4  Recent history:    Insulin regimen: Humulin N 20-25 bid.    Regular Insulin 0 units at suppertime   Oral hypoglycemic drugs: Actos, Metformin 1500 mg    Her blood sugars have been overall inadequately controlled with last A1c 7.6 To improve her control she was given a trial of the V-go pump in 2/17 but she had allergic skin reaction with the adhesive  Although on her last visit her blood sugars are excellent with taking NPH twice a day and regular insulin at suppertime she is not taking any mealtime insulin now  Current blood sugar patterns, management and problems identified:  She has variable blood sugars including in the morning  She is primarily taking her sugars fasting and bedtime  Her fasting readings are generally not high but averaging about 140-150  She stopped taking her REGULAR insulin because she thought it was causing hypoglycemia at bedtime  POSTPRANDIAL readings are high at night frequently but not consistently with average blood sugar 185-190  She is taking her morning insulin at lunchtime and not clear why not in the morning  Today her blood sugar is over 200 after breakfast in the office with eating toast and an egg.  She does not check her  readings after breakfast and usually not after lunch, now eating 3 meals a day usually  She is eating more high fat meats in the evening  She is trying to walk with her dogs but not consistently                   Monitors blood glucose: occasionally     Glucometer:  FreeStyle       Blood Glucose readings from download:   Mean values apply above for all meters except median for One Touch  PRE-MEAL Fasting Lunch Dinner Bedtime Overall  Glucose range: 115-171   124-278   Mean/median: 148   190 172   Meals: 2-3 meals per day. Dinner around 7 pm  Am: usually egg and toast at 11 am       Physical activity: exercise: walking 15-20 min at times     Wt Readings from Last 3 Encounters:  01/14/16 233 lb 12.8 oz (106.051 kg)  12/15/15 234 lb 8 oz (106.369 kg)  11/12/15 237 lb 12.8 oz (107.865 kg)   LABS:  Lab Results  Component Value Date   HGBA1C 7.6* 01/07/2016   HGBA1C 7.7* 09/22/2015   HGBA1C 7.5* 07/03/2015   Lab Results  Component Value Date   MICROALBUR 6.8* 09/22/2015   LDLCALC 84 01/07/2016   CREATININE 1.10 01/07/2016   No visits with results within 1 Week(s) from this visit. Latest known visit with results is:  Lab on 01/07/2016  Component Date Value  Ref Range Status  . Hgb A1c MFr Bld 01/07/2016 7.6* 4.6 - 6.5 % Final   Glycemic Control Guidelines for People with Diabetes:Non Diabetic:  <6%Goal of Therapy: <7%Additional Action Suggested:  >8%   . Sodium 01/07/2016 140  135 - 145 mEq/L Final  . Potassium 01/07/2016 4.1  3.5 - 5.1 mEq/L Final  . Chloride 01/07/2016 105  96 - 112 mEq/L Final  . CO2 01/07/2016 25  19 - 32 mEq/L Final  . Glucose, Bld 01/07/2016 165* 70 - 99 mg/dL Final  . BUN 01/07/2016 32* 6 - 23 mg/dL Final  . Creatinine, Ser 01/07/2016 1.10  0.40 - 1.20 mg/dL Final  . Total Bilirubin 01/07/2016 0.5  0.2 - 1.2 mg/dL Final  . Alkaline Phosphatase 01/07/2016 66  39 - 117 U/L Final  . AST 01/07/2016 17  0 - 37 U/L Final  . ALT 01/07/2016 16  0 - 35  U/L Final  . Total Protein 01/07/2016 7.2  6.0 - 8.3 g/dL Final  . Albumin 01/07/2016 3.9  3.5 - 5.2 g/dL Final  . Calcium 01/07/2016 9.4  8.4 - 10.5 mg/dL Final  . GFR 01/07/2016 51.47* >60.00 mL/min Final  . Cholesterol 01/07/2016 152  0 - 200 mg/dL Final   ATP III Classification       Desirable:  < 200 mg/dL               Borderline High:  200 - 239 mg/dL          High:  > = 240 mg/dL  . Triglycerides 01/07/2016 175.0* 0.0 - 149.0 mg/dL Final   Normal:  <150 mg/dLBorderline High:  150 - 199 mg/dL  . HDL 01/07/2016 32.20* >39.00 mg/dL Final  . VLDL 01/07/2016 35.0  0.0 - 40.0 mg/dL Final  . LDL Cholesterol 01/07/2016 84  0 - 99 mg/dL Final  . Total CHOL/HDL Ratio 01/07/2016 5   Final                  Men          Women1/2 Average Risk     3.4          3.3Average Risk          5.0          4.42X Average Risk          9.6          7.13X Average Risk          15.0          11.0                      . NonHDL 01/07/2016 119.30   Final   NOTE:  Non-HDL goal should be 30 mg/dL higher than patient's LDL goal (i.e. LDL goal of < 70 mg/dL, would have non-HDL goal of < 100 mg/dL)       Medication List       This list is accurate as of: 01/14/16  2:54 PM.  Always use your most recent med list.               acyclovir 400 MG tablet  Commonly known as:  ZOVIRAX     ALPRAZolam 0.25 MG tablet  Commonly known as:  XANAX  Take 0.25 mg by mouth at bedtime as needed for anxiety.     amLODipine 5 MG tablet  Commonly known as:  NORVASC  TAKE 1 TABLET BY MOUTH EVERY DAY  amoxicillin 500 MG capsule  Commonly known as:  AMOXIL  Take 1 capsule by mouth every 8 (eight) hours. Reported on 01/14/2016     apixaban 5 MG Tabs tablet  Commonly known as:  ELIQUIS  Take 1 tablet (5 mg total) by mouth 2 (two) times daily.     atorvastatin 40 MG tablet  Commonly known as:  LIPITOR  Take 1 tablet (40 mg total) by mouth daily at 6 PM.     atropine 1 % ophthalmic solution  INSTILL 1 DROP INTO THE  RIGHT EYE DAILY     bacitracin ophthalmic ointment  APPLY IN RIGHT EYE AT BEDTIME     CALCIUM PO  Take 1 tablet by mouth daily.     COLCRYS 0.6 MG tablet  Generic drug:  colchicine  Take 0.6 mg by mouth daily as needed (gout).     fenofibrate 145 MG tablet  Commonly known as:  TRICOR  Take 145 mg by mouth. TAKING 3 TIMES A WEEK     gabapentin 100 MG capsule  Commonly known as:  NEURONTIN  Take 100 mg by mouth daily.     glucose blood test strip  Commonly known as:  FREESTYLE LITE  Use to test blood sugar twice daily. Dx: E11.9     HYDROcodone-acetaminophen 5-325 MG tablet  Commonly known as:  NORCO/VICODIN  Take 1 tablet by mouth daily as needed for severe pain. Reported on 01/14/2016     insulin NPH Human 100 UNIT/ML injection  Commonly known as:  NOVOLIN N  Inject 20 units every morning and 45 units every night     insulin regular 100 units/mL injection  Commonly known as:  NOVOLIN R RELION  Inject 0.06-0.1 mLs (6-10 Units total) into the skin 3 (three) times daily before meals.     INSULIN SYRINGE .5CC/30GX5/16" 30G X 5/16" 0.5 ML Misc  Use 2 needles per day to inject insulin     levothyroxine 137 MCG tablet  Commonly known as:  SYNTHROID, LEVOTHROID  Take 137 mcg by mouth daily before breakfast.     lisinopril-hydrochlorothiazide 20-25 MG tablet  Commonly known as:  PRINZIDE,ZESTORETIC  Take 1 tablet by mouth daily.     magnesium 30 MG tablet  Take 30 mg by mouth 2 (two) times daily.     metFORMIN 500 MG tablet  Commonly known as:  GLUCOPHAGE  Take 1 tablet every morning and 2 tablets every evening     metoprolol succinate 25 MG 24 hr tablet  Commonly known as:  TOPROL-XL  TAKE 1 TABLET BY MOUTH TWICE DAILY     multivitamin capsule  Take 1 capsule by mouth daily. Reported on 01/14/2016     NEEDLE (DISP) 30 G 30G X 1/2" Misc  Commonly known as:  BD DISP NEEDLES  1 each by Does not apply route 2 (two) times daily before a meal.     ONETOUCH DELICA  LANCETS 99991111 Misc  Use to check blood sugars once daily     pioglitazone 15 MG tablet  Commonly known as:  ACTOS  TAKE 1 TABLET BY MOUTH EVERY DAY     prednisoLONE acetate 1 % ophthalmic suspension  Commonly known as:  PRED FORTE  Place 4 drops into the right eye. Reported on 01/14/2016     predniSONE 10 MG tablet  Commonly known as:  DELTASONE  Reported on 01/14/2016     timolol 0.5 % ophthalmic solution  Commonly known as:  TIMOPTIC  USE 1 DROP IN  RIGHT EYE D     ULORIC 40 MG tablet  Generic drug:  febuxostat  40 mg.     vitamin B-12 1000 MCG tablet  Commonly known as:  CYANOCOBALAMIN  Take 1,000 mcg by mouth daily.     Vitamin D2 2000 units Tabs  Take by mouth.        Allergies:  Allergies  Allergen Reactions  . Pneumovax 23 [Pneumococcal Vac Polyvalent] Anaphylaxis    weakness  . Sulfa Antibiotics Hives    Past Medical History  Diagnosis Date  . Diabetes mellitus without complication ([place])     type 2  . GERD (gastroesophageal reflux disease)   . Hyperlipidemia   . OSA on CPAP   . Anxiety   . TIA (transient ischemic attack)     remote history of TIA's early 2000  . Anemia     s/p Heme work -up normal EGD and colonscopy in 2012 per pt,neg SPEP  . HSV-1 (herpes simplex virus 1) infection     Acyclovir prn  . Joint pain     osteoarthritis by Xray- possible erosion of R 4th MCP,elevated uric  acid ,ANA +   . Morbid obesity ([place])   . Gout   . Osteopenia   . Pain management     Neurosurg: Dr Clydell Hakim  . CKD (chronic kidney disease), stage III   . Aortic stenosis     mild to moderate by echo 2016  . Hypertension   . PAF (paroxysmal atrial fibrillation) ([place])   . Bilateral carotid artery stenosis 06/08/2015    1-39% bilateral  . Pulmonary HTN ([place])     Moderate by echo  2016. PASP 35mmHg    No past surgical history on file.  Family History  Problem Relation Age of Onset  . Arrhythmia Brother     Social History:  reports that she has never  smoked. She has never used smokeless tobacco. She reports that she does not drink alcohol or use illicit drugs.  Review of Systems:   Hypertension:  blood pressure is  controlled She is taking Zestoretic, low dose metoprolol and amlodipine 5 mg.   Lipids: Triglycerides have been high Previously, now controlled on fenofibrate, LDL has been below 100 with Lipitor 40 mg   Lab Results  Component Value Date   CHOL 152 01/07/2016   HDL 32.20* 01/07/2016   LDLCALC 84 01/07/2016   LDLDIRECT 97.2 05/31/2013   TRIG 175.0* 01/07/2016   CHOLHDL 5 01/07/2016    Renal insufficiency: Her creatinine is consistently normal recently   Lab Results  Component Value Date   CREATININE 1.10 01/07/2016   She has hypothyroidism and is taking 137 g levothyroxine, recent TSH normal   Lab Results  Component Value Date   TSH 1.94 09/22/2015    She may take gabapentin low-dose for mild neuropathic symptoms  Diabetic foot exam  as of 6/16 shows normal monofilament sensation in the toes and plantar surfaces, no skin lesions or ulcers on the feet and normal pedal pulses    Examination:   BP 144/84 mmHg  Pulse 84  Temp(Src) 97.7 F (36.5 C)  Resp 16  Ht 5' (1.524 m)  Wt 233 lb 12.8 oz (106.051 kg)  BMI 45.66 kg/m2  SpO2 97%  Body mass index is 45.66 kg/(m^2).     ASSESSMENT/ PLAN:   Diabetes type 2 with obesity :   See history of present illness for detailed discussion of his current management, blood sugar patterns and problems  identified  She is now getting poor control for diabetes with postprandial hyperglycemia Although she was taking regular insulin consistently on her last visit she stopped doing this because of occasional low sugars at bedtime She is not monitoring readings after breakfast or lunch and may be having consistently high readings at those times Also with her taking her insulin in the morning before lunch instead of before breakfast her sugars are starting to go up  midday despite eating a small breakfast  Discussed the need for postprandial blood sugar control She will need to at least take 5-8 units at the meals where she has some carbohydrate, most likely will need to do it primarily at suppertime and possibly at breakfast also  Discussed that he will leave a week and get a better idea of her insulin requirement and blood sugar patterns is to have her keep a record of her pre-and post prandial readings, food eaten and insulin doses on the worksheet that was given today  She will review this with nurse educator on the next visit Discussed blood sugar targets She will also move her NPH to the morning instead of lunchtime   Counseling time on subjects discussed above is over 50% of today's 25 minute visit  Patient Instructions  Take 5-8 Regular insulin at all meals when getting any Carbohydrate  Check blood sugars on waking up 3-4  times a week Also check blood sugars about 2 hours after a meal and do this after different meals by rotation  Recommended blood sugar levels on waking up is 90-130 and about 2 hours after meal is 130-160  Please bring your blood sugar monitor to each visit, thank you  N insulin to be taken before breakfast      Inez Rosato 01/14/2016, 2:54 PM

## 2016-02-01 ENCOUNTER — Ambulatory Visit (HOSPITAL_BASED_OUTPATIENT_CLINIC_OR_DEPARTMENT_OTHER): Payer: PPO

## 2016-02-01 ENCOUNTER — Other Ambulatory Visit: Payer: Self-pay | Admitting: Endocrinology

## 2016-02-11 ENCOUNTER — Encounter: Payer: PPO | Attending: Family Medicine | Admitting: Nutrition

## 2016-02-11 DIAGNOSIS — E1165 Type 2 diabetes mellitus with hyperglycemia: Secondary | ICD-10-CM | POA: Insufficient documentation

## 2016-02-11 DIAGNOSIS — Z794 Long term (current) use of insulin: Secondary | ICD-10-CM | POA: Insufficient documentation

## 2016-02-11 NOTE — Patient Instructions (Signed)
1. walk dog twice a day for 15 min.  2. Limit amounts of cheese to help with lowering total calories, to once a day, and no more than 1 ounce.

## 2016-02-11 NOTE — Progress Notes (Signed)
Pt. Did a food record for 1 1/2 days; FBS yesterday: 123, ate potatoes, [place] and egg for breakfast with diet coke.  BS was 144 2hr.pc. ACL: 122, ate banana with hot dog and hot chocolate:  BS was 120 2hr. Pc.   ACS: Ate 1/4 pound cheeseburger nothing else, with diet drink:  BS 2hr. PcS: 127  FBS today was 110.  Ate Omelet with egg and ham and cheese with unsweet tea,  BS 1 3/4 hours pc: 140.    Insulin dose:  NO R insulin ever!  AM: 25u acB, and will take PM dose of N if HS reading is over 200mg --10-20u.    Walking dog for 15 min. Every day.    Plan: 1.  Encouraged more exercise--walk dog twice a day.  2. Limit amounts of cheese to help with lowering total calories, to once a day, and no more than 1 ounce.

## 2016-02-28 ENCOUNTER — Other Ambulatory Visit: Payer: Self-pay | Admitting: Cardiology

## 2016-03-10 ENCOUNTER — Other Ambulatory Visit: Payer: PPO

## 2016-03-11 ENCOUNTER — Other Ambulatory Visit (INDEPENDENT_AMBULATORY_CARE_PROVIDER_SITE_OTHER): Payer: PPO

## 2016-03-11 DIAGNOSIS — E1165 Type 2 diabetes mellitus with hyperglycemia: Secondary | ICD-10-CM | POA: Diagnosis not present

## 2016-03-11 DIAGNOSIS — Z794 Long term (current) use of insulin: Secondary | ICD-10-CM | POA: Diagnosis not present

## 2016-03-11 LAB — BASIC METABOLIC PANEL
BUN: 34 mg/dL — AB (ref 6–23)
CHLORIDE: 108 meq/L (ref 96–112)
CO2: 29 meq/L (ref 19–32)
CREATININE: 1.16 mg/dL (ref 0.40–1.20)
Calcium: 9.6 mg/dL (ref 8.4–10.5)
GFR: 48.38 mL/min — ABNORMAL LOW (ref >60.00)
GLUCOSE: 119 mg/dL — AB (ref 70–99)
Potassium: 4.9 mEq/L (ref 3.5–5.1)
Sodium: 135 mEq/L (ref 135–145)

## 2016-03-12 LAB — FRUCTOSAMINE: Fructosamine: 275 umol/L (ref 0–285)

## 2016-03-15 ENCOUNTER — Ambulatory Visit (INDEPENDENT_AMBULATORY_CARE_PROVIDER_SITE_OTHER): Payer: PPO | Admitting: Endocrinology

## 2016-03-15 ENCOUNTER — Encounter: Payer: Self-pay | Admitting: Endocrinology

## 2016-03-15 VITALS — BP 136/54 | HR 61 | Ht 60.0 in | Wt 236.0 lb

## 2016-03-15 DIAGNOSIS — Z794 Long term (current) use of insulin: Secondary | ICD-10-CM

## 2016-03-15 DIAGNOSIS — E1165 Type 2 diabetes mellitus with hyperglycemia: Secondary | ICD-10-CM

## 2016-03-15 NOTE — Patient Instructions (Signed)
Do not change bedtime insulin dose or supper Metformin  Check blood sugars on waking up 3-4  times a week Also check blood sugars about 2 hours after a meal and do this after different meals by rotation  Recommended blood sugar levels on waking up is 90-130 and about 2 hours after meal is 130-160  Please bring your blood sugar monitor to each visit, thank you

## 2016-03-15 NOTE — Progress Notes (Signed)
Patient ID: Tina Hester, female   DOB: 1941/01/08, 75 y.o.   MRN: [id number]   Reason for Appointment: Diabetes follow-up   History of Present Illness   Diagnosis: Type 2 DIABETES MELITUS, long-standing    Previous history:  She has been on insulin for a few years. Because of cost she has been switched from Lantus to NPH insulin twice a day She had previously been taking metformin but this was stopped when she had renal insufficiency Overall her blood sugars have been difficult to control and she has previously had relatively poor control. However with adding Actos her blood sugars had started improving significantly Also with her being very consistent with diet in 2015 her A1c had gone down to 6.4 To improve her control she was given a trial of the V-go pump in 2/17 but she had allergic skin reaction with the adhesive  Recent history:    Insulin regimen: Humulin N 20-20/25 bid.    Regular Insulin 0 units at suppertime   Oral hypoglycemic drugs: Actos, Metformin 1500 mg    Her blood sugars have been overall inadequately controlled with last A1c 7.6  Current blood sugar patterns, management and problems identified:  She has checked blood sugars mostly in the mornings and after evening meal  She again refuses to consider taking regular insulin because of fear of hypoglycemia  She thinks she is trying to improve her diet with cutting back on high-fat foods, including meats and cheeses especially after consultation with nurse educator  She continues to adjust her bedtime NPH dose based on what she is eating and her blood sugar rather than fasting readings  FASTING readings are generally fairly good but occasionally over 150  She says that she will not take both the metformin in the evenings if she is eating a lighter meal for fear of hypoglycemia  Still has difficulty losing weight   She is trying to walk with her dogs and stay active otherwise                   Monitors blood glucose: occasionally     Glucometer:  FreeStyle       Blood Glucose readings from download:   Mean values apply above for all meters except median for One Touch  PRE-MEAL Fasting Lunch Dinner Bedtime Overall  Glucose range: 107-171    116-256    Mean/median: 141    190 167    Meals: 2-3 meals per day. Dinner around 7-8 pm  Am: usually egg and toast at 11 am       Physical activity: exercise: walking 15-20 min at times     Wt Readings from Last 3 Encounters:  03/15/16 236 lb (107.049 kg)  01/14/16 233 lb 12.8 oz (106.051 kg)  12/15/15 234 lb 8 oz (106.369 kg)   LABS:  Lab Results  Component Value Date   HGBA1C 7.6* 01/07/2016   HGBA1C 7.7* 09/22/2015   HGBA1C 7.5* 07/03/2015   Lab Results  Component Value Date   MICROALBUR 6.8* 09/22/2015   LDLCALC 84 01/07/2016   CREATININE 1.16 03/11/2016   Lab on 03/11/2016  Component Date Value Ref Range Status  . Sodium 03/11/2016 135  135 - 145 mEq/L Final  . Potassium 03/11/2016 4.9  3.5 - 5.1 mEq/L Final  . Chloride 03/11/2016 108  96 - 112 mEq/L Final  . CO2 03/11/2016 29  19 - 32 mEq/L Final  . Glucose, Bld 03/11/2016 119* 70 - 99 mg/dL  Final  . BUN 03/11/2016 34* 6 - 23 mg/dL Final  . Creatinine, Ser 03/11/2016 1.16  0.40 - 1.20 mg/dL Final  . Calcium 03/11/2016 9.6  8.4 - 10.5 mg/dL Final  . GFR 03/11/2016 48.38* >60.00 mL/min Final  . Fructosamine 03/11/2016 275  0 - 285 umol/L Final   Comment: Published reference interval for apparently healthy subjects between age 3 and 5 is 56 - 285 umol/L and in a poorly controlled diabetic population is 228 - 563 umol/L with a mean of 396 umol/L.        Medication List       This list is accurate as of: 03/15/16 12:55 PM.  Always use your most recent med list.               acyclovir 400 MG tablet  Commonly known as:  ZOVIRAX     ALPRAZolam 0.25 MG tablet  Commonly known as:  XANAX  Take 0.25 mg by mouth at bedtime as needed for anxiety.      amLODipine 5 MG tablet  Commonly known as:  NORVASC  TAKE 1 TABLET BY MOUTH EVERY DAY     apixaban 5 MG Tabs tablet  Commonly known as:  ELIQUIS  Take 1 tablet (5 mg total) by mouth 2 (two) times daily.     atorvastatin 40 MG tablet  Commonly known as:  LIPITOR  Take 1 tablet (40 mg total) by mouth daily at 6 PM.     bacitracin ophthalmic ointment  APPLY IN RIGHT EYE AT BEDTIME     CALCIUM PO  Take 1 tablet by mouth daily.     COLCRYS 0.6 MG tablet  Generic drug:  colchicine  Take 0.6 mg by mouth daily as needed (gout).     fenofibrate 145 MG tablet  Commonly known as:  TRICOR  Take 145 mg by mouth. TAKING 3 TIMES A WEEK     gabapentin 100 MG capsule  Commonly known as:  NEURONTIN  Take 100 mg by mouth daily.     glucose blood test strip  Commonly known as:  FREESTYLE LITE  Use to test blood sugar twice daily. Dx: E11.9     HYDROcodone-acetaminophen 5-325 MG tablet  Commonly known as:  NORCO/VICODIN  Take 1 tablet by mouth daily as needed for severe pain. Reported on 01/14/2016     insulin NPH Human 100 UNIT/ML injection  Commonly known as:  NOVOLIN N  Inject 20 units every morning and 45 units every night     insulin regular 100 units/mL injection  Commonly known as:  NOVOLIN R RELION  Inject 0.06-0.1 mLs (6-10 Units total) into the skin 3 (three) times daily before meals.     INSULIN SYRINGE .5CC/30GX5/16" 30G X 5/16" 0.5 ML Misc  Use 2 needles per day to inject insulin     levothyroxine 137 MCG tablet  Commonly known as:  SYNTHROID, LEVOTHROID  Take 137 mcg by mouth daily before breakfast.     lisinopril-hydrochlorothiazide 20-25 MG tablet  Commonly known as:  PRINZIDE,ZESTORETIC  Take 1 tablet by mouth daily.     magnesium 30 MG tablet  Take 30 mg by mouth 2 (two) times daily.     metFORMIN 500 MG tablet  Commonly known as:  GLUCOPHAGE  TAKE 1 TABLET BY MOUTH EVERY MORNING AND 2 TABLETS EVERY EVENING     metoprolol succinate 25 MG 24 hr tablet    Commonly known as:  TOPROL-XL  TAKE 1 TABLET BY MOUTH TWICE  DAILY     multivitamin capsule  Take 1 capsule by mouth daily. Reported on 01/14/2016     NEEDLE (DISP) 30 G 30G X 1/2" Misc  Commonly known as:  BD DISP NEEDLES  1 each by Does not apply route 2 (two) times daily before a meal.     ONETOUCH DELICA LANCETS 99991111 Misc  Use to check blood sugars once daily     pioglitazone 15 MG tablet  Commonly known as:  ACTOS  TAKE 1 TABLET BY MOUTH EVERY DAY     prednisoLONE acetate 1 % ophthalmic suspension  Commonly known as:  PRED FORTE  Place 4 drops into the right eye. Reported on 01/14/2016     timolol 0.5 % ophthalmic solution  Commonly known as:  TIMOPTIC  USE 1 DROP IN RIGHT EYE D     ULORIC 40 MG tablet  Generic drug:  febuxostat  40 mg.     vitamin B-12 1000 MCG tablet  Commonly known as:  CYANOCOBALAMIN  Take 1,000 mcg by mouth daily.     Vitamin D2 2000 units Tabs  Take by mouth.        Allergies:  Allergies  Allergen Reactions  . Pneumovax 23 [Pneumococcal Vac Polyvalent] Anaphylaxis    weakness  . Sulfa Antibiotics Hives    Past Medical History  Diagnosis Date  . Diabetes mellitus without complication ([place])     type 2  . GERD (gastroesophageal reflux disease)   . Hyperlipidemia   . OSA on CPAP   . Anxiety   . TIA (transient ischemic attack)     remote history of TIA's early 2000  . Anemia     s/p Heme work -up normal EGD and colonscopy in 2012 per pt,neg SPEP  . HSV-1 (herpes simplex virus 1) infection     Acyclovir prn  . Joint pain     osteoarthritis by Xray- possible erosion of R 4th MCP,elevated uric  acid ,ANA +   . Morbid obesity ([place])   . Gout   . Osteopenia   . Pain management     Neurosurg: Dr Clydell Hakim  . CKD (chronic kidney disease), stage III   . Aortic stenosis     mild to moderate by echo 2016  . Hypertension   . PAF (paroxysmal atrial fibrillation) ([place])   . Bilateral carotid artery stenosis 06/08/2015    1-39%  bilateral  . Pulmonary HTN ([place])     Moderate by echo  2016. PASP 15mmHg    No past surgical history on file.  Family History  Problem Relation Age of Onset  . Arrhythmia Brother     Social History:  reports that she has never smoked. She has never used smokeless tobacco. She reports that she does not drink alcohol or use illicit drugs.  Review of Systems:   Hypertension:  blood pressure is Again controlled She is taking Zestoretic, low dose metoprolol and amlodipine 5 mg.   Lipids: Triglycerides have been high Previously, now controlled on fenofibrate, LDL has been below 100 with Lipitor 40 mg   Lab Results  Component Value Date   CHOL 152 01/07/2016   HDL 32.20* 01/07/2016   LDLCALC 84 01/07/2016   LDLDIRECT 97.2 05/31/2013   TRIG 175.0* 01/07/2016   CHOLHDL 5 01/07/2016    Renal insufficiency: Her creatinine is consistently normal    Lab Results  Component Value Date   CREATININE 1.16 03/11/2016   She has hypothyroidism and is taking 137 g levothyroxine,  recent TSH normal   Lab Results  Component Value Date   TSH 1.94 09/22/2015    She may take gabapentin low-dose for mild neuropathic symptoms  Diabetic foot exam  as of 6/16 shows normal monofilament sensation in the toes and plantar surfaces, no skin lesions or ulcers on the feet and normal pedal pulses    Examination:   BP 136/54 mmHg  Pulse 61  Ht 5' (1.524 m)  Wt 236 lb (107.049 kg)  BMI 46.09 kg/m2  SpO2 96%  Body mass index is 46.09 kg/(m^2).   No ankle edema present  ASSESSMENT/ PLAN:   Diabetes type 2 with obesity :   See history of present illness for detailed discussion of his current management, blood sugar patterns and problems identified  As judged by her fructosamine are overall blood sugars are somewhat better This appears to be from her improving her diet since her last visit She does still get readings over 200 after supper but is still refusing to take any regular insulin on  a regular basis for her evening meal Also taking variable doses of evening metformin and NPH at night based on what she is eating Currently not monitoring blood sugars after breakfast or lunch Currently also is taking her morning NPH at variable times as she forgets to take it in the morning  Recommendations:  She needs to continue taking the same dose of NPH at bedtime, she will take 25 units which is her usual dose  Will not adjust her metformin dose on a daily basis with take the same dose consistently  Start checking blood sugars after breakfast and lunch more regularly  She can take at least 5 units of regular insulin when eating out or planning to eat larger carbohydrate meal Continue Actos as she has taken this for quite some time and this had previously helped her control   Counseling time on subjects discussed above is over 50% of today's 25 minute visit  Patient Instructions  Do not change bedtime insulin dose or supper Metformin  Check blood sugars on waking up 3-4  times a week Also check blood sugars about 2 hours after a meal and do this after different meals by rotation  Recommended blood sugar levels on waking up is 90-130 and about 2 hours after meal is 130-160  Please bring your blood sugar monitor to each visit, thank you        [facility] 03/15/2016, 12:55 PM

## 2016-03-31 ENCOUNTER — Encounter: Payer: Self-pay | Admitting: Physician Assistant

## 2016-04-12 DIAGNOSIS — S0501XA Injury of conjunctiva and corneal abrasion without foreign body, right eye, initial encounter: Secondary | ICD-10-CM | POA: Insufficient documentation

## 2016-04-12 DIAGNOSIS — H25812 Combined forms of age-related cataract, left eye: Secondary | ICD-10-CM | POA: Diagnosis not present

## 2016-04-12 DIAGNOSIS — B0052 Herpesviral keratitis: Secondary | ICD-10-CM | POA: Diagnosis not present

## 2016-04-12 DIAGNOSIS — Z947 Corneal transplant status: Secondary | ICD-10-CM | POA: Insufficient documentation

## 2016-04-12 DIAGNOSIS — Z9849 Cataract extraction status, unspecified eye: Secondary | ICD-10-CM | POA: Diagnosis not present

## 2016-04-12 DIAGNOSIS — S0501XD Injury of conjunctiva and corneal abrasion without foreign body, right eye, subsequent encounter: Secondary | ICD-10-CM | POA: Diagnosis not present

## 2016-04-12 DIAGNOSIS — Z961 Presence of intraocular lens: Secondary | ICD-10-CM | POA: Diagnosis not present

## 2016-05-04 ENCOUNTER — Other Ambulatory Visit: Payer: Self-pay | Admitting: Endocrinology

## 2016-05-07 ENCOUNTER — Other Ambulatory Visit: Payer: Self-pay | Admitting: *Deleted

## 2016-05-07 MED ORDER — APIXABAN 5 MG PO TABS: 5.0000 mg | ORAL_TABLET | Freq: Two times a day (BID) | ORAL | 2 refills | Status: DC

## 2016-05-19 DIAGNOSIS — N2581 Secondary hyperparathyroidism of renal origin: Secondary | ICD-10-CM | POA: Diagnosis not present

## 2016-05-19 DIAGNOSIS — N189 Chronic kidney disease, unspecified: Secondary | ICD-10-CM | POA: Diagnosis not present

## 2016-05-25 DIAGNOSIS — N2581 Secondary hyperparathyroidism of renal origin: Secondary | ICD-10-CM | POA: Diagnosis not present

## 2016-05-25 DIAGNOSIS — N189 Chronic kidney disease, unspecified: Secondary | ICD-10-CM | POA: Diagnosis not present

## 2016-05-25 DIAGNOSIS — I129 Hypertensive chronic kidney disease with stage 1 through stage 4 chronic kidney disease, or unspecified chronic kidney disease: Secondary | ICD-10-CM | POA: Diagnosis not present

## 2016-05-25 DIAGNOSIS — D631 Anemia in chronic kidney disease: Secondary | ICD-10-CM | POA: Diagnosis not present

## 2016-05-25 DIAGNOSIS — Z23 Encounter for immunization: Secondary | ICD-10-CM | POA: Diagnosis not present

## 2016-06-02 ENCOUNTER — Other Ambulatory Visit (HOSPITAL_COMMUNITY): Payer: PPO

## 2016-06-10 ENCOUNTER — Other Ambulatory Visit (INDEPENDENT_AMBULATORY_CARE_PROVIDER_SITE_OTHER): Payer: PPO

## 2016-06-10 DIAGNOSIS — Z794 Long term (current) use of insulin: Secondary | ICD-10-CM | POA: Diagnosis not present

## 2016-06-10 DIAGNOSIS — E1165 Type 2 diabetes mellitus with hyperglycemia: Secondary | ICD-10-CM

## 2016-06-10 LAB — HEMOGLOBIN A1C: HEMOGLOBIN A1C: 7.1 % — AB (ref 4.6–6.5)

## 2016-06-15 ENCOUNTER — Ambulatory Visit (INDEPENDENT_AMBULATORY_CARE_PROVIDER_SITE_OTHER): Payer: PPO | Admitting: Endocrinology

## 2016-06-15 ENCOUNTER — Encounter: Payer: Self-pay | Admitting: Endocrinology

## 2016-06-15 VITALS — BP 132/72 | HR 58 | Temp 98.1°F | Resp 16 | Ht 60.0 in | Wt 241.8 lb

## 2016-06-15 DIAGNOSIS — I10 Essential (primary) hypertension: Secondary | ICD-10-CM | POA: Diagnosis not present

## 2016-06-15 DIAGNOSIS — E063 Autoimmune thyroiditis: Secondary | ICD-10-CM

## 2016-06-15 DIAGNOSIS — E1165 Type 2 diabetes mellitus with hyperglycemia: Secondary | ICD-10-CM | POA: Diagnosis not present

## 2016-06-15 DIAGNOSIS — Z794 Long term (current) use of insulin: Secondary | ICD-10-CM

## 2016-06-15 DIAGNOSIS — E038 Other specified hypothyroidism: Secondary | ICD-10-CM | POA: Diagnosis not present

## 2016-06-15 MED ORDER — CANAGLIFLOZIN 100 MG PO TABS: ORAL_TABLET | ORAL | 3 refills | Status: DC

## 2016-06-15 NOTE — Patient Instructions (Addendum)
Invokana in am  Reduce insulin 5 units and possibly more  Increase fluids  Cut Lisinopril in 1/2   Check BP weekly at least  Check with podiatrist

## 2016-06-15 NOTE — Progress Notes (Signed)
Patient ID: Tina Hester, female   DOB: 12/16/1940, 75 y.o.   MRN: [id number]   Reason for Appointment: Diabetes follow-up   History of Present Illness   Diagnosis: Type 2 DIABETES MELITUS, long-standing    Previous history:  She has been on insulin for a few years. Because of cost she has been switched from Lantus to NPH insulin twice a day She had previously been taking metformin but this was stopped when she had renal insufficiency Overall her blood sugars have been difficult to control and she has previously had relatively poor control. However with adding Actos her blood sugars had started improving significantly Also with her being very consistent with diet in 2015 her A1c had gone down to 6.4 To improve her control she was given a trial of the V-go pump in 2/17 but she had allergic skin reaction with the adhesive  Recent history:    Insulin regimen: Humulin N 20--25  .    Regular Insulin 0-6 units at suppertime   Oral hypoglycemic drugs: Actos 15 mg, Metformin 1500 mg    Her blood sugars have been better controlled recently with A1c down to 7.1, previously 7.6  Current blood sugar patterns, management and problems identified:  She has not brought her monitor for download  Again has checked blood sugars mostly in the mornings and at bedtime  She now says that because she is trying to eat smaller portions that she does not take any regular insulin unless she is weighing out to eat or eating a big meal  She is again afraid of HYPOGLYCEMIA overnight and did have an episode at 3 AM a couple of weeks ago  She is mostly taking the same dose of NPH at bedtime but if blood sugar is normal she will skip this, does not think blood sugars higher the next day  FASTING readings are generally fairly good and reportedly during normal  She thinks her bedtime readings are mostly new normal and only occasionally around 160 and never over 200 now  Still has difficulty  losing weight and has continued to gain weight now.  Her weight last year was as low as 218                 Monitors blood glucose: occasionally     Glucometer:  FreeStyle       Blood Glucose readings from   Mean values apply above for all meters except median for One Touch  PRE-MEAL Fasting Lunch Dinner Bedtime Overall  Glucose range: 90-110  166 101-160   Mean/median:         Meals: 2-3 meals per day. Dinner around 7-8 pm  Am: usually egg and toast at 11 am       Physical activity: exercise: walking less Recently because of foot pain     Wt Readings from Last 3 Encounters:  06/15/16 241 lb 12.8 oz (109.7 kg)  03/15/16 236 lb (107 kg)  01/14/16 233 lb 12.8 oz (106.1 kg)   LABS:  Lab Results  Component Value Date   HGBA1C 7.1 (H) 06/10/2016   HGBA1C 7.6 (H) 01/07/2016   HGBA1C 7.7 (H) 09/22/2015   Lab Results  Component Value Date   MICROALBUR 6.8 (H) 09/22/2015   LDLCALC 84 01/07/2016   CREATININE 1.16 03/11/2016   Lab on 06/10/2016  Component Date Value Ref Range Status  . Hgb A1c MFr Bld 06/10/2016 7.1* 4.6 - 6.5 % Final  Medication List       Accurate as of 06/15/16  5:22 PM. Always use your most recent med list.          acyclovir 400 MG tablet Commonly known as:  ZOVIRAX   ALPRAZolam 0.25 MG tablet Commonly known as:  XANAX Take 0.25 mg by mouth at bedtime as needed for anxiety.   amLODipine 5 MG tablet Commonly known as:  NORVASC TAKE 1 TABLET BY MOUTH EVERY DAY   apixaban 5 MG Tabs tablet Commonly known as:  ELIQUIS Take 1 tablet (5 mg total) by mouth 2 (two) times daily.   atorvastatin 40 MG tablet Commonly known as:  LIPITOR Take 1 tablet (40 mg total) by mouth daily at 6 PM.   CALCIUM PO Take 1 tablet by mouth daily.   canagliflozin 100 MG Tabs tablet Commonly known as:  INVOKANA 1 tablet before breakfast   COLCRYS 0.6 MG tablet Generic drug:  colchicine Take 0.6 mg by mouth daily as needed (gout).   fenofibrate 145 MG  tablet Commonly known as:  TRICOR Take 145 mg by mouth. TAKING 3 TIMES A WEEK   gabapentin 100 MG capsule Commonly known as:  NEURONTIN Take 100 mg by mouth daily.   glucose blood test strip Commonly known as:  FREESTYLE LITE Use to test blood sugar twice daily. Dx: E11.9   insulin NPH Human 100 UNIT/ML injection Commonly known as:  NOVOLIN N Inject 20 units every morning and 45 units every night   insulin regular 100 units/mL injection Commonly known as:  NOVOLIN R RELION Inject 0.06-0.1 mLs (6-10 Units total) into the skin 3 (three) times daily before meals.   INSULIN SYRINGE .5CC/30GX5/16" 30G X 5/16" 0.5 ML Misc Use 2 needles per day to inject insulin   levothyroxine 137 MCG tablet Commonly known as:  SYNTHROID, LEVOTHROID Take 137 mcg by mouth daily before breakfast.   lisinopril-hydrochlorothiazide 20-25 MG tablet Commonly known as:  PRINZIDE,ZESTORETIC Take 1 tablet by mouth daily.   magnesium 30 MG tablet Take 30 mg by mouth 2 (two) times daily.   metFORMIN 500 MG tablet Commonly known as:  GLUCOPHAGE TAKE 1 TABLET BY MOUTH EVERY MORNING AND 2 TABLETS EVERY EVENING   metoprolol succinate 25 MG 24 hr tablet Commonly known as:  TOPROL-XL TAKE 1 TABLET BY MOUTH TWICE DAILY   multivitamin capsule Take 1 capsule by mouth daily. Reported on 01/14/2016   NEEDLE (DISP) 30 G 30G X 1/2" Misc Commonly known as:  BD DISP NEEDLES 1 each by Does not apply route 2 (two) times daily before a meal.   ONETOUCH DELICA LANCETS 99991111 Misc Use to check blood sugars once daily   pioglitazone 15 MG tablet Commonly known as:  ACTOS TAKE 1 TABLET BY MOUTH EVERY DAY   prednisoLONE acetate 1 % ophthalmic suspension Commonly known as:  PRED FORTE Place 4 drops into the right eye. Reported on 01/14/2016   timolol 0.5 % ophthalmic solution Commonly known as:  TIMOPTIC USE 1 DROP IN RIGHT EYE D   ULORIC 40 MG tablet Generic drug:  febuxostat 40 mg.   vitamin B-12 1000 MCG  tablet Commonly known as:  CYANOCOBALAMIN Take 1,000 mcg by mouth daily.   Vitamin D2 2000 units Tabs Take by mouth.       Allergies:  Allergies  Allergen Reactions  . Pneumovax 23 [Pneumococcal Vac Polyvalent] Anaphylaxis    weakness  . Sulfa Antibiotics Hives    Past Medical History:  Diagnosis Date  .  Anemia    s/p Heme work -up normal EGD and colonscopy in 2012 per pt,neg SPEP  . Anxiety   . Aortic stenosis    mild to moderate by echo 2016  . Bilateral carotid artery stenosis 06/08/2015   1-39% bilateral  . CKD (chronic kidney disease), stage III   . Diabetes mellitus without complication ([place])    type 2  . GERD (gastroesophageal reflux disease)   . Gout   . HSV-1 (herpes simplex virus 1) infection    Acyclovir prn  . Hyperlipidemia   . Hypertension   . Joint pain    osteoarthritis by Xray- possible erosion of R 4th MCP,elevated uric  acid ,ANA +   . Morbid obesity ([place])   . OSA on CPAP   . Osteopenia   . PAF (paroxysmal atrial fibrillation) ([place])   . Pain management    Neurosurg: Dr Clydell Hakim  . Pulmonary HTN    Moderate by echo  2016. PASP 34mmHg  . TIA (transient ischemic attack)    remote history of TIA's early 2000    No past surgical history on file.  Family History  Problem Relation Age of Onset  . Arrhythmia Brother     Social History:  reports that she has never smoked. She has never used smokeless tobacco. She reports that she does not drink alcohol or use drugs.  Review of Systems:   Hypertension:  blood pressure is Well controlled She is taking Zestoretic 20/25, low dose metoprolol and amlodipine 5 mg.   Lipids: Triglycerides have been high Previously, now controlled on fenofibrate, LDL has been below 100 with Lipitor 40 mg   Lab Results  Component Value Date   CHOL 152 01/07/2016   HDL 32.20 (L) 01/07/2016   LDLCALC 84 01/07/2016   LDLDIRECT 97.2 05/31/2013   TRIG 175.0 (H) 01/07/2016   CHOLHDL 5 01/07/2016    Renal  insufficiency: Her creatinine is consistently normal    Lab Results  Component Value Date   CREATININE 1.16 03/11/2016   She has hypothyroidism and is taking 137 g levothyroxine, Last TSH normal   Lab Results  Component Value Date   TSH 1.94 09/22/2015    She Will take gabapentin as needed for mild neuropathic symptoms  Diabetic foot exam  as of 10/17 shows normal monofilament sensation in the toes and plantar surfaces, no skin lesions or ulcers on the feet and normal pedal pulses    Examination:   BP 132/72   Pulse (!) 58   Temp 98.1 F (36.7 C)   Resp 16   Ht 5' (1.524 m)   Wt 241 lb 12.8 oz (109.7 kg)   SpO2 98%   BMI 47.22 kg/m   Body mass index is 47.22 kg/m.   No ankle edema present  Diabetic Foot Exam - Simple   Simple Foot Form Diabetic Foot exam was performed with the following findings:  Yes   Visual Inspection No deformities, no ulcerations, no other skin breakdown bilaterally:  Yes Sensation Testing Intact to touch and monofilament testing bilaterally:  Yes Pulse Check Posterior Tibialis and Dorsalis pulse intact bilaterally:  Yes Comments      ASSESSMENT/ PLAN:   Diabetes type 2 with obesity :   See history of present illness for detailed discussion of  current management, blood sugar patterns and problems identified  As judged by his A1c her blood sugars are improving This is also partly from her trying to be careful with her diet with portions  and fat intake However her main problem appears to be progressively weight gain of over 20 pounds since last year With a regimen of NPH twice a day, low dose Actos and metformin her blood sugars are overall well controlled but she is not doing any readings 2 hours after meals Not clear if she is needing any mealtime insulin, most of her postprandial readings appear to be not excessively high  Recommendations: She is a good candidate for medication like Invokana.  Renal function has been consistently  normal recently Discussed action of SGLT 2 drugs on lowering glucose by decreasing kidney absorption of glucose, benefits of weight loss and lower blood pressure, possible side effects including candidiasis and dosage regimen   She will start Invokana 100 mg daily, free 30 day coupon given, also appears that her co-pay will be $15  With starting Invokana she will reduce her NPH insulin by 5 units in a couple of days and monitor blood sugar closely  Also needs to do some readings in the afternoon  She will be needing to increase her fluid intake at least in the first few days of starting Invokana  She will reduce her lisinopril HCTZ to half tablet  She will be seen in 3 weeks for repeat renal function, blood pressure and assessment of home readings with monitor download  She will talk to her podiatrist about foot pain and try to increase exercise when able to   Counseling time on subjects discussed above is over 50% of today's 25 minute visit  Patient Instructions  Invokana in am  Reduce insulin 5 units and possibly more  Increase fluids  Cut Lisinopril in 1/2   Check BP weekly at least  Check with podiatrist    [facility] 06/15/2016, 5:22 PM

## 2016-06-16 ENCOUNTER — Other Ambulatory Visit: Payer: Self-pay

## 2016-06-16 ENCOUNTER — Ambulatory Visit (HOSPITAL_COMMUNITY): Payer: PPO | Attending: Cardiology

## 2016-06-16 DIAGNOSIS — Z6841 Body Mass Index (BMI) 40.0 and over, adult: Secondary | ICD-10-CM | POA: Diagnosis not present

## 2016-06-16 DIAGNOSIS — I359 Nonrheumatic aortic valve disorder, unspecified: Secondary | ICD-10-CM

## 2016-06-16 DIAGNOSIS — I071 Rheumatic tricuspid insufficiency: Secondary | ICD-10-CM | POA: Insufficient documentation

## 2016-06-16 DIAGNOSIS — E119 Type 2 diabetes mellitus without complications: Secondary | ICD-10-CM | POA: Diagnosis not present

## 2016-06-16 DIAGNOSIS — I119 Hypertensive heart disease without heart failure: Secondary | ICD-10-CM | POA: Insufficient documentation

## 2016-06-16 DIAGNOSIS — I352 Nonrheumatic aortic (valve) stenosis with insufficiency: Secondary | ICD-10-CM | POA: Diagnosis not present

## 2016-06-16 DIAGNOSIS — I059 Rheumatic mitral valve disease, unspecified: Secondary | ICD-10-CM | POA: Insufficient documentation

## 2016-06-17 ENCOUNTER — Telehealth: Payer: Self-pay

## 2016-06-17 DIAGNOSIS — I272 Pulmonary hypertension, unspecified: Secondary | ICD-10-CM

## 2016-06-17 NOTE — Telephone Encounter (Signed)
Informed patient of results and verbal understanding expressed.  Repeat ECHO ordered to be scheduled in 1 year. Patient agrees with treatment plan. 

## 2016-06-17 NOTE — Telephone Encounter (Signed)
-----   Message from Sueanne Margarita, MD sent at 06/16/2016  6:58 PM EDT ----- Echo showed normal LVF with increased stiffness of heart muscle, mild AS and moderate pulmonary HTN - repeat echo in 1 year for AS and pulmonary HTN

## 2016-06-24 ENCOUNTER — Ambulatory Visit: Payer: PPO | Admitting: Physician Assistant

## 2016-06-25 ENCOUNTER — Encounter: Payer: Self-pay | Admitting: Physician Assistant

## 2016-06-25 ENCOUNTER — Telehealth: Payer: Self-pay

## 2016-06-25 ENCOUNTER — Ambulatory Visit (INDEPENDENT_AMBULATORY_CARE_PROVIDER_SITE_OTHER): Payer: PPO | Admitting: Physician Assistant

## 2016-06-25 VITALS — BP 128/42 | HR 67 | Ht 61.0 in | Wt 240.4 lb

## 2016-06-25 DIAGNOSIS — G4733 Obstructive sleep apnea (adult) (pediatric): Secondary | ICD-10-CM

## 2016-06-25 DIAGNOSIS — I350 Nonrheumatic aortic (valve) stenosis: Secondary | ICD-10-CM

## 2016-06-25 DIAGNOSIS — E1122 Type 2 diabetes mellitus with diabetic chronic kidney disease: Secondary | ICD-10-CM

## 2016-06-25 DIAGNOSIS — I480 Paroxysmal atrial fibrillation: Secondary | ICD-10-CM | POA: Diagnosis not present

## 2016-06-25 DIAGNOSIS — Z794 Long term (current) use of insulin: Secondary | ICD-10-CM

## 2016-06-25 DIAGNOSIS — N183 Chronic kidney disease, stage 3 unspecified: Secondary | ICD-10-CM

## 2016-06-25 DIAGNOSIS — Z9989 Dependence on other enabling machines and devices: Secondary | ICD-10-CM

## 2016-06-25 DIAGNOSIS — I779 Disorder of arteries and arterioles, unspecified: Secondary | ICD-10-CM

## 2016-06-25 DIAGNOSIS — I10 Essential (primary) hypertension: Secondary | ICD-10-CM

## 2016-06-25 DIAGNOSIS — E7800 Pure hypercholesterolemia, unspecified: Secondary | ICD-10-CM

## 2016-06-25 DIAGNOSIS — I739 Peripheral vascular disease, unspecified: Secondary | ICD-10-CM

## 2016-06-25 NOTE — Telephone Encounter (Signed)
She needs to stop iNVOKANA for the weekend and stay well hydrated. Please call after the weekend for further instructions. She may need to stop HCTZ completely if she is to restart iNVOKANA. We'll leave this decision for Dr. Dwyane Dee. INVOKANA is actually raising her potassium, not dropping it.

## 2016-06-25 NOTE — Progress Notes (Signed)
Cardiology Office Note:    Date:  06/25/2016   ID:  Tina Hester, DOB 1941/01/17, MRN [id number]  PCP:  Lilian Coma, MD  Cardiologist:  Dr. Fransico Him   Electrophysiologist:  N/a Nephrologist: Dr. Posey Pronto Endocrinologist: Dr. Dwyane Dee  Referring MD: Jonathon Jordan, MD   Chief Complaint  Patient presents with  . Follow-up    AFib, aortic stenosis    History of Present Illness:    Tina Hester is a 75 y.o. female with a hx of Paroxysmal atrial fibrillation, HTN, bilateral carotid artery disease, CKD, sleep apnea on CPAP, DM, HL, prior TIA, aortic stenosis.  Last seen by Dr. Radford Pax 4/17.  Returns for FU.  Her endocrinologist recently put her on [place].  However, since she started this medication, she has had muscle cramps and has not been able to sleep.  She stopped it yesterday and feels much better now. She denies chest pain, syncope, orthopnea, PND, edema.  She notes dyspnea on exertion with more moderate to extreme activities that is overall stable.  She is adherent with CPAP.    Prior CV studies that were reviewed today include:    Echo 06/16/16 EF 55-60, normal wall motion, grade 2 diastolic dysfunction, mild aortic stenosis with mean 18/peak 34, trivial AI, MAC, mild LAE, PASP 41 (mildly elevated)  Carotid US 06/05/15 Bilateral ICA 1-39 FU 2 years  Myoview 4/13 Breast attenuation, no ischemia or scar, EF 79, low risk  Past Medical History:  Diagnosis Date  . Anemia    s/p Heme work -up normal EGD and colonscopy in 2012 per pt,neg SPEP  . Anxiety   . Aortic stenosis    mild to moderate by echo 2016  . Bilateral carotid artery stenosis 06/08/2015   1-39% bilateral  . CKD (chronic kidney disease), stage III   . Diabetes mellitus without complication ([place])    type 2  . GERD (gastroesophageal reflux disease)   . Gout   . HSV-1 (herpes simplex virus 1) infection    Acyclovir prn  . Hyperlipidemia   . Hypertension   . Joint pain    osteoarthritis  by Xray- possible erosion of R 4th MCP,elevated uric  acid ,ANA +   . Morbid obesity ([place])   . OSA on CPAP   . Osteopenia   . PAF (paroxysmal atrial fibrillation) ([place])   . Pain management    Neurosurg: Dr Clydell Hakim  . Pulmonary HTN    Moderate by echo  2016. PASP 42mmHg  . TIA (transient ischemic attack)    remote history of TIA's early 2000    History reviewed. No pertinent surgical history.  Current Medications: Current Meds  Medication Sig  . acyclovir (ZOVIRAX) 400 MG tablet Take 400 mg by mouth 2 (two) times daily.   Marland Kitchen ALPRAZolam (XANAX) 0.25 MG tablet Take 0.25 mg by mouth at bedtime as needed for anxiety.  Marland Kitchen amLODipine (NORVASC) 5 MG tablet TAKE 1 TABLET BY MOUTH EVERY DAY  . apixaban (ELIQUIS) 5 MG TABS tablet Take 1 tablet (5 mg total) by mouth 2 (two) times daily.  Marland Kitchen atorvastatin (LIPITOR) 40 MG tablet Take 1 tablet (40 mg total) by mouth daily at 6 PM.  . CALCIUM PO Take 1 tablet by mouth daily.   . canagliflozin (INVOKANA) 100 MG TABS tablet 1 tablet before breakfast  . COLCRYS 0.6 MG tablet Take 0.6 mg by mouth daily as needed (gout).   . Ergocalciferol (VITAMIN D2) 2000 units TABS Take 2,000 Units by mouth  daily.   . gabapentin (NEURONTIN) 100 MG capsule Take 100 mg by mouth daily.  Marland Kitchen glucose blood (FREESTYLE LITE) test strip Use to test blood sugar twice daily. Dx: E11.9  . insulin NPH Human (HUMULIN N) 100 UNIT/ML injection Inject into the skin. INJECT 20 UNITS EVERY MORNING AND 45 UNITS EVERY NIGHT  . insulin regular (NOVOLIN R RELION) 100 units/mL injection Inject 0.06-0.1 mLs (6-10 Units total) into the skin 3 (three) times daily before meals.  . Insulin Syringe-Needle U-100 (INSULIN SYRINGE .5CC/30GX5/16") 30G X 5/16" 0.5 ML MISC Use 2 needles per day to inject insulin  . levothyroxine (SYNTHROID, LEVOTHROID) 137 MCG tablet Take 137 mcg by mouth daily before breakfast.   . lisinopril-hydrochlorothiazide (PRINZIDE,ZESTORETIC) 20-25 MG per tablet Take 1 tablet  by mouth daily.  . magnesium 30 MG tablet Take 30 mg by mouth 2 (two) times daily.  . metFORMIN (GLUCOPHAGE) 500 MG tablet TAKE 1 TABLET BY MOUTH EVERY MORNING AND 2 TABLETS EVERY EVENING  . metoprolol succinate (TOPROL-XL) 25 MG 24 hr tablet TAKE 1 TABLET BY MOUTH TWICE DAILY  . Multiple Vitamin (MULTIVITAMIN) capsule Take 1 capsule by mouth daily. Reported on 01/14/2016  . NEEDLE, DISP, 30 G (BD DISP NEEDLES) 30G X 1/2" MISC 1 each by Does not apply route 2 (two) times daily before a meal.  . ONETOUCH DELICA LANCETS 99991111 MISC Use to check blood sugars once daily  . pioglitazone (ACTOS) 15 MG tablet TAKE 1 TABLET BY MOUTH EVERY DAY  . prednisoLONE acetate (PRED FORTE) 1 % ophthalmic suspension Place 4 drops into the right eye. Reported on 01/14/2016  . timolol (TIMOPTIC) 0.5 % ophthalmic solution USE 1 DROP IN RIGHT EYE D  . vitamin B-12 (CYANOCOBALAMIN) 1000 MCG tablet Take 1,000 mcg by mouth daily.     Allergies:   Pneumococcal vaccine; Pneumovax 23 [pneumococcal vac polyvalent]; Other; and Sulfa antibiotics   Social History   Social History  . Marital status: Unknown    Spouse name: N/A  . Number of children: N/A  . Years of education: N/A   Social History Main Topics  . Smoking status: Never Smoker  . Smokeless tobacco: Never Used  . Alcohol use No  . Drug use: No  . Sexual activity: Not Asked   Other Topics Concern  . None   Social History Narrative  . None     Family History:  The patient's family history includes Arrhythmia in her brother.   ROS:   Please see the history of present illness.    Review of Systems  Cardiovascular: Positive for dyspnea on exertion.  Musculoskeletal: Positive for back pain and joint pain.  Gastrointestinal: Positive for diarrhea.  Neurological: Positive for dizziness.  Psychiatric/Behavioral: The patient is nervous/anxious.    All other systems reviewed and are negative.   EKGs/Labs/Other Test Reviewed:    EKG:  EKG is  ordered  today.  The ekg ordered today demonstrates NSR, HR 67, LAD, IVCD, QTc 445 ms, no change since 05/26/15  Recent Labs: 09/22/2015: TSH 1.94 01/07/2016: ALT 16 03/11/2016: BUN 34; Creatinine, Ser 1.16; Potassium 4.9; Sodium 135   Labs from nephrology 05/19/16: Hemoglobin 11.2, creatinine 1.11, potassium 4.9, magnesium 1.8   Recent Lipid Panel    Component Value Date/Time   CHOL 152 01/07/2016 0951   TRIG 175.0 (H) 01/07/2016 0951   HDL 32.20 (L) 01/07/2016 0951   CHOLHDL 5 01/07/2016 0951   VLDL 35.0 01/07/2016 0951   LDLCALC 84 01/07/2016 0951   LDLDIRECT 97.2  05/31/2013 0942     Physical Exam:    VS:  BP (!) 128/42   Pulse 67   Ht 5\' 1"  (1.549 m)   Wt 240 lb 6.4 oz (109 kg)   BMI 45.42 kg/m     Wt Readings from Last 3 Encounters:  06/25/16 240 lb 6.4 oz (109 kg)  06/15/16 241 lb 12.8 oz (109.7 kg)  03/15/16 236 lb (107 kg)     Physical Exam  Constitutional: She is oriented to person, place, and time. She appears well-developed and well-nourished. No distress.  HENT:  Head: Normocephalic and atraumatic.  Eyes: No scleral icterus.  Neck: No JVD present.  Cardiovascular: Normal rate, regular rhythm, S1 normal and S2 normal.   Murmur heard.  Harsh systolic murmur is present with a grade of 2/6  at the upper right sternal border Pulmonary/Chest: Effort normal. She has no wheezes. She has no rales.  Abdominal: Soft. There is no tenderness.  Musculoskeletal: She exhibits no edema.  Neurological: She is alert and oriented to person, place, and time.  Skin: Skin is warm and dry.  Psychiatric: She has a normal mood and affect.    ASSESSMENT:    1. PAF (paroxysmal atrial fibrillation) ([place])   2. Aortic valve stenosis, etiology of cardiac valve disease unspecified   3. Bilateral carotid artery disease ([place])   4. OSA on CPAP   5. Essential hypertension   6. CKD (chronic kidney disease) stage 3, GFR 30-59 ml/min   7. Pure hypercholesterolemia   8. Type 2 diabetes mellitus  with stage 3 chronic kidney disease, with long-term current use of insulin (HCC)    PLAN:    In order of problems listed above:  1. Paroxysmal atrial fibrillation - She is maintaining normal sinus rhythm. She notes infrequent episodes of atrial fibrillation on metoprolol. She is tolerating Eliquis without bleeding issues. She had recent lab work with her nephrologist and her hemoglobin and creatinine remain stable. She remains on an appropriate dose of Eliquis of 5 mg twice a day.  2. Aortic stenosis - This is mild by recent echocardiogram. She is to have a repeat echocardiogram in one year.  3. Carotid artery disease - Mild plaque by carotid ultrasound in 10/16. Repeat ultrasound is planned in 10/18.  4. OSA - The patient is adherent with CPAP therapy.  5. HTN - blood pressure is well controlled.  6. CKD - She is followed by nephrology.  7. HL - Managed by Endocrinology.  8. DM - She has decided to [place].  She will discuss further with her Endocrinologist.    Medication Adjustments/Labs and Tests Ordered: Current medicines are reviewed at length with the patient today.  Concerns regarding medicines are outlined above.  Medication changes, Labs and Tests ordered today are outlined in the Patient Instructions noted below. Patient Instructions  Medication Instructions:  Your physician recommends that you continue on your current medications as directed. Please refer to the Current Medication list given to you today.  Labwork: NONE  Testing/Procedures: NONE  Follow-Up: Your physician wants you to follow-up in: 6 MONTHS WITH DR. Mallie Snooks will receive a reminder letter in the mail two months in advance. If you don't receive a letter, please call our office to schedule the follow-up appointment.  Any Other Special Instructions Will Be Listed Below (If Applicable).  If you need a refill on your cardiac medications before your next appointment, please call your  pharmacy.  Signed, Richardson Dopp, PA-C  06/25/2016 1:30  PM    [facility] [address], [place], [place]  [address] Phone: [phone]; Fax: [phone]

## 2016-06-25 NOTE — Patient Instructions (Addendum)
Medication Instructions:  Your physician recommends that you continue on your current medications as directed. Please refer to the Current Medication list given to you today.  Labwork: NONE  Testing/Procedures: NONE  Follow-Up: Your physician wants you to follow-up in: 6 MONTHS  WITH  DR  TURNER   You will receive a reminder letter in the mail two months in advance. If you don't receive a letter, please call our office to schedule the follow-up appointment.  Any Other Special Instructions Will Be Listed Below (If Applicable).     If you need a refill on your cardiac medications before your next appointment, please call your pharmacy.   

## 2016-06-25 NOTE — Telephone Encounter (Signed)
Patient started invokana on 06/16/2017 and has since been having her muscles tightening from cramps all over her body- heart doctor told her he thinks it may be depleting her potassium-please advise during Dr. Dwyane Dee absence.... Tina Hester DOB September 01, 1940

## 2016-06-25 NOTE — Telephone Encounter (Signed)
Called and left message for patient advising of note from [place] to stop invokana for the weekend and to stay hydrated. Advised her to call back on Monday for further instructions. Advised I would notify Dr.Kumar of the message from [place]. Gave call back number to office if she needs to call back for any other questions.

## 2016-06-27 NOTE — Telephone Encounter (Signed)
Please let her know that the Invokana does not cause potassium loss but may be losing some fluid.  Need to know if her blood sugars were doing better when she was taking it.  Also when forwarding the telephone note please include previous information and not a new note

## 2016-07-02 ENCOUNTER — Other Ambulatory Visit: Payer: PPO

## 2016-07-06 ENCOUNTER — Ambulatory Visit: Payer: PPO | Admitting: Endocrinology

## 2016-07-06 ENCOUNTER — Other Ambulatory Visit: Payer: Self-pay | Admitting: Endocrinology

## 2016-07-06 ENCOUNTER — Other Ambulatory Visit: Payer: Self-pay | Admitting: Cardiology

## 2016-07-27 DIAGNOSIS — Z947 Corneal transplant status: Secondary | ICD-10-CM | POA: Diagnosis not present

## 2016-07-27 DIAGNOSIS — G4730 Sleep apnea, unspecified: Secondary | ICD-10-CM | POA: Diagnosis not present

## 2016-07-27 DIAGNOSIS — E113492 Type 2 diabetes mellitus with severe nonproliferative diabetic retinopathy without macular edema, left eye: Secondary | ICD-10-CM | POA: Diagnosis not present

## 2016-07-27 DIAGNOSIS — T86840 Corneal transplant rejection: Secondary | ICD-10-CM | POA: Diagnosis not present

## 2016-08-17 ENCOUNTER — Other Ambulatory Visit: Payer: Self-pay | Admitting: Endocrinology

## 2016-08-26 DIAGNOSIS — M25561 Pain in right knee: Secondary | ICD-10-CM | POA: Diagnosis not present

## 2016-08-26 DIAGNOSIS — S8001XA Contusion of right knee, initial encounter: Secondary | ICD-10-CM | POA: Diagnosis not present

## 2016-09-09 DIAGNOSIS — S8001XD Contusion of right knee, subsequent encounter: Secondary | ICD-10-CM | POA: Diagnosis not present

## 2016-09-09 DIAGNOSIS — M25561 Pain in right knee: Secondary | ICD-10-CM | POA: Diagnosis not present

## 2016-09-23 ENCOUNTER — Emergency Department (HOSPITAL_COMMUNITY)
Admission: EM | Admit: 2016-09-23 | Discharge: 2016-09-24 | Disposition: A | Discharge status: Home / Self Care | Payer: PPO | Attending: Emergency Medicine | Admitting: Emergency Medicine

## 2016-09-23 ENCOUNTER — Encounter (HOSPITAL_COMMUNITY): Payer: Self-pay

## 2016-09-23 DIAGNOSIS — Z23 Encounter for immunization: Secondary | ICD-10-CM | POA: Insufficient documentation

## 2016-09-23 DIAGNOSIS — Y999 Unspecified external cause status: Secondary | ICD-10-CM | POA: Diagnosis not present

## 2016-09-23 DIAGNOSIS — R200 Anesthesia of skin: Secondary | ICD-10-CM | POA: Diagnosis not present

## 2016-09-23 DIAGNOSIS — W268XXA Contact with other sharp object(s), not elsewhere classified, initial encounter: Secondary | ICD-10-CM | POA: Diagnosis not present

## 2016-09-23 DIAGNOSIS — Z7901 Long term (current) use of anticoagulants: Secondary | ICD-10-CM | POA: Diagnosis not present

## 2016-09-23 DIAGNOSIS — S61213A Laceration without foreign body of left middle finger without damage to nail, initial encounter: Secondary | ICD-10-CM | POA: Insufficient documentation

## 2016-09-23 DIAGNOSIS — I129 Hypertensive chronic kidney disease with stage 1 through stage 4 chronic kidney disease, or unspecified chronic kidney disease: Secondary | ICD-10-CM | POA: Diagnosis not present

## 2016-09-23 DIAGNOSIS — E1122 Type 2 diabetes mellitus with diabetic chronic kidney disease: Secondary | ICD-10-CM | POA: Insufficient documentation

## 2016-09-23 DIAGNOSIS — Y929 Unspecified place or not applicable: Secondary | ICD-10-CM | POA: Diagnosis not present

## 2016-09-23 DIAGNOSIS — N183 Chronic kidney disease, stage 3 (moderate): Secondary | ICD-10-CM | POA: Insufficient documentation

## 2016-09-23 DIAGNOSIS — Z8673 Personal history of transient ischemic attack (TIA), and cerebral infarction without residual deficits: Secondary | ICD-10-CM | POA: Insufficient documentation

## 2016-09-23 DIAGNOSIS — Y9389 Activity, other specified: Secondary | ICD-10-CM | POA: Insufficient documentation

## 2016-09-23 DIAGNOSIS — Z794 Long term (current) use of insulin: Secondary | ICD-10-CM | POA: Diagnosis not present

## 2016-09-23 NOTE — ED Triage Notes (Signed)
Pt sent her by [facility] for finger injury finger, pt cut it on a can today around 4pm, states her finger is numb, wants laceration evaluated for nerve involvment, bleeding controlled.

## 2016-09-24 MED ORDER — TETANUS-DIPHTH-ACELL PERTUSSIS 5-2.5-18.5 LF-MCG/0.5 IM SUSP
0.5000 mL | Freq: Once | INTRAMUSCULAR | Status: AC
  Administered 2016-09-24: 0.5 mL via INTRAMUSCULAR
  Filled 2016-09-24: qty 0.5

## 2016-09-24 MED ORDER — LIDOCAINE HCL (PF) 1 % IJ SOLN
INTRAMUSCULAR | Status: AC
  Filled 2016-09-24: qty 5

## 2016-09-24 MED ORDER — LIDOCAINE HCL (PF) 1 % IJ SOLN
5.0000 mL | Freq: Once | INTRAMUSCULAR | Status: AC
  Administered 2016-09-24: 5 mL via INTRADERMAL
  Filled 2016-09-24: qty 5

## 2016-09-24 NOTE — ED Notes (Signed)
ED Provider at bedside. 

## 2016-09-24 NOTE — ED Notes (Signed)
Suture cart at bedside 

## 2016-09-24 NOTE — ED Provider Notes (Signed)
[facility] DEPT Provider Note   CSN: [id number] Arrival date & time: 09/23/16  1928   By signing my name below, I, Delton Prairie, attest that this documentation has been prepared under the direction and in the presence of Sherwood Gambler, MD  Electronically Signed: Delton Prairie, ED Scribe. 09/24/16. 12:41 AM.   History   Chief Complaint Chief Complaint  Patient presents with  . Finger Injury   The history is provided by the patient. No language interpreter was used.   HPI Comments:  Tina Hester is a 76 y.o. female, with a hx of DM, who presents to the Emergency Department complaining of sudden onset laceration to her left middle finger s/p an injury which occurred at 4 PM yesterday. Pt notes she sustained her injury while opening a can. She also reports decreased sensation to the tip of her injured finger. No alleviating factors noted. No other associated symptoms noted. Pt visited Urgent Care yesterday but was advised to visit [facility] for her symptoms. Tetanus status unknown. Bleeding controlled with direct pressure (is on Eliquis)  Past Medical History:  Diagnosis Date  . Anemia    s/p Heme work -up normal EGD and colonscopy in 2012 per pt,neg SPEP  . Anxiety   . Aortic stenosis    mild to moderate by echo 2016  . Bilateral carotid artery stenosis 06/08/2015   1-39% bilateral  . CKD (chronic kidney disease), stage III   . Diabetes mellitus without complication ([place])    type 2  . GERD (gastroesophageal reflux disease)   . Gout   . HSV-1 (herpes simplex virus 1) infection    Acyclovir prn  . Hyperlipidemia   . Hypertension   . Joint pain    osteoarthritis by Xray- possible erosion of R 4th MCP,elevated uric  acid ,ANA +   . Morbid obesity ([place])   . OSA on CPAP   . Osteopenia   . PAF (paroxysmal atrial fibrillation) ([place])   . Pain management    Neurosurg: Dr Clydell Hakim  . Pulmonary HTN    Moderate by echo  2016. PASP 59mmHg  . TIA (transient ischemic attack)      remote history of TIA's early 2000    Patient Active Problem List   Diagnosis Date Noted  . Pulmonary HTN 12/15/2015  . Bilateral carotid artery stenosis 06/08/2015  . Hypertension   . OSA on CPAP   . Aortic valve disorder   . PAF (paroxysmal atrial fibrillation) ([place])   . Obesity 09/04/2013  . Gout 09/04/2013  . DM2 (diabetes mellitus, type 2) ([place]) 05/29/2013  . Other and unspecified hyperlipidemia 05/29/2013    History reviewed. No pertinent surgical history.  OB History    No data available       Home Medications    Prior to Admission medications   Medication Sig Start Date End Date Taking? Authorizing Provider  acyclovir (ZOVIRAX) 400 MG tablet Take 400 mg by mouth 2 (two) times daily.  09/03/15   Historical Provider, MD  ALPRAZolam Duanne Moron) 0.25 MG tablet Take 0.25 mg by mouth at bedtime as needed for anxiety.    Historical Provider, MD  amLODipine (NORVASC) 5 MG tablet TAKE 1 TABLET BY MOUTH EVERY DAY 07/06/16   Sueanne Margarita, MD  apixaban (ELIQUIS) 5 MG TABS tablet Take 1 tablet (5 mg total) by mouth 2 (two) times daily. 05/07/16   Sueanne Margarita, MD  atorvastatin (LIPITOR) 40 MG tablet Take 1 tablet (40 mg total) by mouth daily  at 6 PM. 08/01/13   Sueanne Margarita, MD  CALCIUM PO Take 1 tablet by mouth daily.     Historical Provider, MD  canagliflozin (INVOKANA) 100 MG TABS tablet 1 tablet before breakfast 06/15/16   Elayne Snare, MD  COLCRYS 0.6 MG tablet Take 0.6 mg by mouth daily as needed (gout).  05/22/13   Historical Provider, MD  Ergocalciferol (VITAMIN D2) 2000 units TABS Take 2,000 Units by mouth daily.     Historical Provider, MD  gabapentin (NEURONTIN) 100 MG capsule Take 100 mg by mouth daily.    Historical Provider, MD  glucose blood (FREESTYLE LITE) test strip Use to test blood sugar twice daily. Dx: E11.9 11/12/15   Elayne Snare, MD  insulin NPH Human (HUMULIN N) 100 UNIT/ML injection Inject into the skin. INJECT 20 UNITS EVERY MORNING AND 45 UNITS EVERY NIGHT     Historical Provider, MD  insulin regular (NOVOLIN R RELION) 100 units/mL injection Inject 0.06-0.1 mLs (6-10 Units total) into the skin 3 (three) times daily before meals. 04/21/15   Elayne Snare, MD  Insulin Syringe-Needle U-100 (INSULIN SYRINGE .5CC/30GX5/16") 30G X 5/16" 0.5 ML MISC Use 2 needles per day to inject insulin 08/15/13   Elayne Snare, MD  levothyroxine (SYNTHROID, LEVOTHROID) 137 MCG tablet Take 137 mcg by mouth daily before breakfast.  04/16/13   Historical Provider, MD  lisinopril-hydrochlorothiazide (PRINZIDE,ZESTORETIC) 20-25 MG per tablet Take 1 tablet by mouth daily. 08/01/13   Sueanne Margarita, MD  magnesium 30 MG tablet Take 30 mg by mouth 2 (two) times daily.    Historical Provider, MD  metFORMIN (GLUCOPHAGE) 500 MG tablet TAKE 1 TABLET BY MOUTH EVERY MORNING AND 2 TABLETS EVERY EVENING 08/18/16   Elayne Snare, MD  metoprolol succinate (TOPROL-XL) 25 MG 24 hr tablet TAKE 1 TABLET BY MOUTH TWICE DAILY 03/01/16   Sueanne Margarita, MD  Multiple Vitamin (MULTIVITAMIN) capsule Take 1 capsule by mouth daily. Reported on 01/14/2016    Historical Provider, MD  NEEDLE, DISP, 30 G (BD DISP NEEDLES) 30G X 1/2" MISC 1 each by Does not apply route 2 (two) times daily before a meal. 07/11/14   Elayne Snare, MD  [facility] DELICA LANCETS 99991111 MISC Use to check blood sugars once daily 10/08/13   Elayne Snare, MD  pioglitazone (ACTOS) 15 MG tablet TAKE 1 TABLET BY MOUTH EVERY DAY 07/06/16   Elayne Snare, MD  prednisoLONE acetate (PRED FORTE) 1 % ophthalmic suspension Place 4 drops into the right eye. Reported on 01/14/2016 03/14/13   Historical Provider, MD  timolol (TIMOPTIC) 0.5 % ophthalmic solution USE 1 DROP IN RIGHT EYE D 10/21/15   Historical Provider, MD  vitamin B-12 (CYANOCOBALAMIN) 1000 MCG tablet Take 1,000 mcg by mouth daily.    Historical Provider, MD    Family History Family History  Problem Relation Age of Onset  . Arrhythmia Brother     Social History Social History  Substance Use Topics  .  Smoking status: Never Smoker  . Smokeless tobacco: Never Used  . Alcohol use No     Allergies   Pneumococcal vaccine; Pneumovax 23 [pneumococcal vac polyvalent]; Other; and Sulfa antibiotics   Review of Systems Review of Systems  Skin: Positive for wound.  Neurological: Negative for numbness.  All other systems reviewed and are negative.  Physical Exam Updated Vital Signs BP 133/75 (BP Location: Right Arm)   Pulse 107   Temp 97.9 F (36.6 C) (Oral)   Resp 17   Wt 242 lb (109.8 kg)  SpO2 97%   BMI 45.73 kg/m   Physical Exam  Constitutional: She is oriented to person, place, and time. She appears well-developed and well-nourished.  HENT:  Head: Normocephalic and atraumatic.  Right Ear: External ear normal.  Left Ear: External ear normal.  Nose: Nose normal.  Eyes: Right eye exhibits no discharge. Left eye exhibits no discharge.  Pulmonary/Chest: Effort normal.  Abdominal: She exhibits no distension.  Neurological: She is alert and oriented to person, place, and time.  Slightly decreased sensation to the thumb side distal to the injury, however she can discriminate between sharp and dull. Full ROM and strength of left middle finger/left hand Normal cap refill  Skin: Skin is warm and dry. Capillary refill takes less than 2 seconds.  Less than 1 cm very superificial, horizontal laceration to the palmar aspect of the left middle finger on the thumb side.   Nursing note and vitals reviewed.   ED Treatments / Results  DIAGNOSTIC STUDIES:  Oxygen Saturation is 97% on RA, normal by my interpretation.    COORDINATION OF CARE:  12:33 AM Discussed treatment plan with pt at bedside and pt agreed to plan.  Labs (all labs ordered are listed, but only abnormal results are displayed) Labs Reviewed - No data to display  EKG  EKG Interpretation None       Radiology No results found.  Procedures .Marland KitchenLaceration Repair Date/Time: 09/24/2016 12:35 AM Performed by:  Sherwood Gambler Authorized by: Sherwood Gambler   Consent:    Consent obtained:  Verbal   Consent given by:  Patient Anesthesia (see MAR for exact dosages):    Anesthesia method:  Nerve block   Block location:  Middle finger digital   Block needle gauge:  25 G   Block anesthetic:  Lidocaine 1% w/o epi   Block injection procedure:  Anatomic landmarks identified   Block outcome:  Anesthesia achieved (initially incomplete, then re blocked and complete) Laceration details:    Location:  Finger   Finger location:  L long finger   Length (cm):  0.5 (less than 1 cm) Repair type:    Repair type:  Simple Pre-procedure details:    Preparation:  Patient was prepped and draped in usual sterile fashion Exploration:    Wound exploration: wound explored through full range of motion     Contaminated: no   Treatment:    Area cleansed with:  Betadine   Amount of cleaning:  Standard   Irrigation solution:  Sterile saline   Irrigation method:  Syringe   Visualized foreign bodies/material removed: no   Skin repair:    Repair method:  Sutures   Suture size:  4-0   Suture material:  Prolene   Suture technique:  Simple interrupted   Number of sutures:  2 Approximation:    Approximation:  Close   Vermilion border: well-aligned   Post-procedure details:    Dressing:  Antibiotic ointment   Patient tolerance of procedure:  Tolerated well, no immediate complications Comments:     Initially poor anesthesia on nerve block, re-blocked after first stitch with complete numbness.      (including critical care time)  Medications Ordered in ED Medications - No data to display   Initial Impression / Assessment and Plan / ED Course  I have reviewed the triage vital signs and the nursing notes.  Pertinent labs & imaging results that were available during my care of the patient were reviewed by me and considered in my medical decision making (see chart  for details).     The laceration is very  superficial so maybe she "stunned" the nerve with her subjective decreased sensation distal to the injury. Discussed with patient, will give hand referral. However given normal strength/movement she's not sure she would want repair if this was an option. Thus wound was cleaned and closed, tetatnus updated and she will be referred to hand. Otherwise PCP to remove sutures in 7 days. Wound appears clean. Good cap refill.  Final Clinical Impressions(s) / ED Diagnoses   Final diagnoses:  Laceration of left middle finger without foreign body without damage to nail, initial encounter    New Prescriptions New Prescriptions   No medications on file   I personally performed the services described in this documentation, which was scribed in my presence. The recorded information has been reviewed and is accurate.     Sherwood Gambler, MD 09/24/16 [phone]

## 2016-10-07 ENCOUNTER — Other Ambulatory Visit: Payer: Self-pay | Admitting: Endocrinology

## 2016-10-11 ENCOUNTER — Other Ambulatory Visit (INDEPENDENT_AMBULATORY_CARE_PROVIDER_SITE_OTHER): Payer: PPO

## 2016-10-11 DIAGNOSIS — E1165 Type 2 diabetes mellitus with hyperglycemia: Secondary | ICD-10-CM

## 2016-10-11 DIAGNOSIS — E038 Other specified hypothyroidism: Secondary | ICD-10-CM | POA: Diagnosis not present

## 2016-10-11 DIAGNOSIS — Z794 Long term (current) use of insulin: Secondary | ICD-10-CM

## 2016-10-11 DIAGNOSIS — E063 Autoimmune thyroiditis: Secondary | ICD-10-CM

## 2016-10-11 LAB — TSH: TSH: 2.49 u[IU]/mL (ref 0.35–4.50)

## 2016-10-11 LAB — BASIC METABOLIC PANEL
BUN: 36 mg/dL — ABNORMAL HIGH (ref 6–23)
CHLORIDE: 105 meq/L (ref 96–112)
CO2: 24 meq/L (ref 19–32)
CREATININE: 1.34 mg/dL — AB (ref 0.40–1.20)
Calcium: 8.8 mg/dL (ref 8.4–10.5)
GFR: 40.9 mL/min — ABNORMAL LOW (ref >60.00)
Glucose, Bld: 126 mg/dL — ABNORMAL HIGH (ref 70–99)
Potassium: 4.6 mEq/L (ref 3.5–5.1)
SODIUM: 138 meq/L (ref 135–145)

## 2016-10-14 ENCOUNTER — Ambulatory Visit: Payer: PPO | Admitting: Endocrinology

## 2016-10-19 DIAGNOSIS — E785 Hyperlipidemia, unspecified: Secondary | ICD-10-CM | POA: Diagnosis not present

## 2016-10-19 DIAGNOSIS — D692 Other nonthrombocytopenic purpura: Secondary | ICD-10-CM | POA: Diagnosis not present

## 2016-10-19 DIAGNOSIS — E1165 Type 2 diabetes mellitus with hyperglycemia: Secondary | ICD-10-CM | POA: Diagnosis not present

## 2016-10-19 DIAGNOSIS — E1121 Type 2 diabetes mellitus with diabetic nephropathy: Secondary | ICD-10-CM | POA: Diagnosis not present

## 2016-10-19 DIAGNOSIS — E039 Hypothyroidism, unspecified: Secondary | ICD-10-CM | POA: Diagnosis not present

## 2016-10-19 DIAGNOSIS — Z0000 Encounter for general adult medical examination without abnormal findings: Secondary | ICD-10-CM | POA: Diagnosis not present

## 2016-10-19 DIAGNOSIS — Z79899 Other long term (current) drug therapy: Secondary | ICD-10-CM | POA: Diagnosis not present

## 2016-10-19 DIAGNOSIS — F322 Major depressive disorder, single episode, severe without psychotic features: Secondary | ICD-10-CM | POA: Diagnosis not present

## 2016-10-19 DIAGNOSIS — N183 Chronic kidney disease, stage 3 (moderate): Secondary | ICD-10-CM | POA: Diagnosis not present

## 2016-10-19 DIAGNOSIS — E559 Vitamin D deficiency, unspecified: Secondary | ICD-10-CM | POA: Diagnosis not present

## 2016-10-19 DIAGNOSIS — I4891 Unspecified atrial fibrillation: Secondary | ICD-10-CM | POA: Diagnosis not present

## 2016-10-19 DIAGNOSIS — I10 Essential (primary) hypertension: Secondary | ICD-10-CM | POA: Diagnosis not present

## 2016-10-19 DIAGNOSIS — I350 Nonrheumatic aortic (valve) stenosis: Secondary | ICD-10-CM | POA: Diagnosis not present

## 2016-10-19 LAB — LIPID PANEL
CHOLESTEROL: 132 mg/dL (ref 0–200)
HDL: 28 mg/dL — AB (ref 35–70)
LDL Cholesterol: 53 mg/dL
Triglycerides: 258 mg/dL — AB (ref 40–160)

## 2016-10-21 ENCOUNTER — Ambulatory Visit (INDEPENDENT_AMBULATORY_CARE_PROVIDER_SITE_OTHER): Payer: PPO | Admitting: Endocrinology

## 2016-10-21 ENCOUNTER — Encounter: Payer: Self-pay | Admitting: Endocrinology

## 2016-10-21 VITALS — BP 120/88 | HR 89 | Ht 61.0 in | Wt 239.0 lb

## 2016-10-21 DIAGNOSIS — Z794 Long term (current) use of insulin: Secondary | ICD-10-CM | POA: Diagnosis not present

## 2016-10-21 DIAGNOSIS — E1165 Type 2 diabetes mellitus with hyperglycemia: Secondary | ICD-10-CM | POA: Diagnosis not present

## 2016-10-21 LAB — URINALYSIS, ROUTINE W REFLEX MICROSCOPIC
Bilirubin Urine: NEGATIVE
Hgb urine dipstick: NEGATIVE
KETONES UR: NEGATIVE
Leukocytes, UA: NEGATIVE
Nitrite: POSITIVE — AB
PH: 6 (ref 5.0–8.0)
SPECIFIC GRAVITY, URINE: 1.01 (ref 1.000–1.030)
Total Protein, Urine: NEGATIVE
URINE GLUCOSE: NEGATIVE
UROBILINOGEN UA: 0.2 (ref 0.0–1.0)

## 2016-10-21 LAB — POCT GLYCOSYLATED HEMOGLOBIN (HGB A1C): Hemoglobin A1C: 6.7

## 2016-10-21 LAB — MICROALBUMIN / CREATININE URINE RATIO
CREATININE, U: 68.3 mg/dL
MICROALB UR: 2.4 mg/dL — AB (ref 0.0–1.9)
Microalb Creat Ratio: 3.5 mg/g (ref 0.0–30.0)

## 2016-10-21 NOTE — Progress Notes (Signed)
Patient ID: Tina Hester, female   DOB: [birth year], 76 y.o.   MRN: [id number]   Reason for Appointment: Diabetes follow-up   History of Present Illness   Diagnosis: Type 2 DIABETES MELITUS, long-standing    Previous history:  She has been on insulin for a few years. Because of cost she has been switched from Lantus to NPH insulin twice a day She had previously been taking metformin but this was stopped when she had renal insufficiency Overall her blood sugars have been difficult to control and she has previously had relatively poor control. However with adding Actos her blood sugars had started improving significantly Also with her being very consistent with diet in 2015 her A1c had gone down to 6.4 To improve her control she was given a trial of the V-go pump in 2/17 but she had allergic skin reaction with the adhesive  Recent history:    Insulin regimen: Humulin N 20 in am, 25  .    Regular Insulin 0 units at suppertime   Oral hypoglycemic drugs: Actos 15 mg, Metformin [phone] mg    Her blood sugars have been better controlled recently with A1c down to 6.7, previously 7.1 Her last visit was in 10/17  Current blood sugar patterns, management and problems identified:  She has not brought her monitor for download again  She does not think her blood sugars are high at various times but is checking mostly in the mornings and only some late at night  She did not tolerate Invokana and was having muscle cramps  However she thinks she is cutting back on portions recently and still trying to eat balanced meals  Currently not taking any mealtime insulin both morning and evening  Not taking any readings after her first meal of the day  However her weight is slightly better  No recent hypoglycemia                 Monitors blood glucose: occasionally     Glucometer:  FreeStyle       Blood Glucose readings from recall:  Mean values apply above for all meters except  median for One Touch  PRE-MEAL Fasting Lunch Dinner Bedtime Overall  Glucose range: 89-120  ? 130-179   Mean/median:         Meals: 2-3 meals per day. Dinner around 7-8 pm  Am: usually egg and toast at 11 am       Physical activity: exercise: knee pain     Wt Readings from Last 3 Encounters:  10/21/16 239 lb (108.4 kg)  09/23/16 242 lb (109.8 kg)  06/25/16 240 lb 6.4 oz (109 kg)   LABS:6.7  Lab Results  Component Value Date   HGBA1C 7.1 (H) 06/10/2016   HGBA1C 7.6 (H) 01/07/2016   HGBA1C 7.7 (H) 09/22/2015   Lab Results  Component Value Date   MICROALBUR 6.8 (H) 09/22/2015   LDLCALC 84 01/07/2016   CREATININE 1.34 (H) 10/11/2016   No visits with results within 1 Week(s) from this visit.  Latest known visit with results is:  Lab on 10/11/2016  Component Date Value Ref Range Status  . Sodium 10/11/2016 138  135 - 145 mEq/L Final  . Potassium 10/11/2016 4.6  3.5 - 5.1 mEq/L Final  . Chloride 10/11/2016 105  96 - 112 mEq/L Final  . CO2 10/11/2016 24  19 - 32 mEq/L Final  . Glucose, Bld 10/11/2016 126* 70 - 99 mg/dL Final  . BUN 10/11/2016 36*  6 - 23 mg/dL Final  . Creatinine, Ser 10/11/2016 1.34* 0.40 - 1.20 mg/dL Final  . Calcium 10/11/2016 8.8  8.4 - 10.5 mg/dL Final  . GFR 10/11/2016 40.90* >60.00 mL/min Final  . TSH 10/11/2016 2.49  0.35 - 4.50 uIU/mL Final     Allergies as of 10/21/2016      Reactions   Pneumococcal Vaccine Anaphylaxis   weakness   Pneumovax 23 [pneumococcal Vac Polyvalent] Anaphylaxis   weakness   Other Hives   Other reaction(s): Other (See Comments) Pneumonia  Vaccine- very ill   Sulfa Antibiotics Hives      Medication List       Accurate as of 10/21/16  2:59 PM. Always use your most recent med list.          acyclovir 400 MG tablet Commonly known as:  ZOVIRAX Take 400 mg by mouth 2 (two) times daily.   ALPRAZolam 0.25 MG tablet Commonly known as:  XANAX Take 0.25 mg by mouth at bedtime as needed for anxiety.   amLODipine 5  MG tablet Commonly known as:  NORVASC TAKE 1 TABLET BY MOUTH EVERY DAY   apixaban 5 MG Tabs tablet Commonly known as:  ELIQUIS Take 1 tablet (5 mg total) by mouth 2 (two) times daily.   atorvastatin 40 MG tablet Commonly known as:  LIPITOR Take 1 tablet (40 mg total) by mouth daily at 6 PM.   CALCIUM PO Take 1 tablet by mouth daily.   COLCRYS 0.6 MG tablet Generic drug:  colchicine Take 0.6 mg by mouth daily as needed (gout).   gabapentin 100 MG capsule Commonly known as:  NEURONTIN Take 100 mg by mouth daily.   glucose blood test strip Commonly known as:  FREESTYLE LITE Use to test blood sugar twice daily. Dx: E11.9   HUMULIN N 100 UNIT/ML injection Generic drug:  insulin NPH Human Inject 20-45 Units into the skin See admin instructions. INJECT 20 UNITS EVERY MORNING AND 45 UNITS EVERY NIGHT   insulin regular 250 units/2.16mL (100 units/mL) injection Commonly known as:  NOVOLIN R RELION Inject 0.06-0.1 mLs (6-10 Units total) into the skin 3 (three) times daily before meals.   INSULIN SYRINGE .5CC/30GX5/16" 30G X 5/16" 0.5 ML Misc Use 2 needles per day to inject insulin   levothyroxine 137 MCG tablet Commonly known as:  SYNTHROID, LEVOTHROID Take 137 mcg by mouth daily before breakfast.   lisinopril-hydrochlorothiazide 20-25 MG tablet Commonly known as:  PRINZIDE,ZESTORETIC Take 1 tablet by mouth daily.   magnesium 30 MG tablet Take 30 mg by mouth 2 (two) times daily.   metFORMIN 500 MG tablet Commonly known as:  GLUCOPHAGE TAKE 1 TABLET BY MOUTH EVERY MORNING AND 2 TABLETS EVERY EVENING   metoprolol succinate 25 MG 24 hr tablet Commonly known as:  TOPROL-XL TAKE 1 TABLET BY MOUTH TWICE DAILY   multivitamin capsule Take 1 capsule by mouth daily. Reported on 01/14/2016   NEEDLE (DISP) 30 G 30G X 1/2" Misc Commonly known as:  BD DISP NEEDLES 1 each by Does not apply route 2 (two) times daily before a meal.   ONETOUCH DELICA LANCETS 99991111 Misc Use to check  blood sugars once daily   pioglitazone 15 MG tablet Commonly known as:  ACTOS TAKE 1 TABLET BY MOUTH EVERY DAY   timolol 0.5 % ophthalmic solution Commonly known as:  TIMOPTIC USE 1 DROP IN RIGHT EYE D   vitamin B-12 1000 MCG tablet Commonly known as:  CYANOCOBALAMIN Take 1,000 mcg by mouth  daily.   Vitamin D2 2000 units Tabs Take 2,000 Units by mouth daily.       Allergies:  Allergies  Allergen Reactions  . Pneumococcal Vaccine Anaphylaxis    weakness  . Pneumovax 23 [Pneumococcal Vac Polyvalent] Anaphylaxis    weakness  . Other Hives    Other reaction(s): Other (See Comments) Pneumonia  Vaccine- very ill  . Sulfa Antibiotics Hives    Past Medical History:  Diagnosis Date  . Anemia    s/p Heme work -up normal EGD and colonscopy in 2012 per pt,neg SPEP  . Anxiety   . Aortic stenosis    mild to moderate by echo 2016  . Bilateral carotid artery stenosis 06/08/2015   1-39% bilateral  . CKD (chronic kidney disease), stage III   . Diabetes mellitus without complication ([place])    type 2  . GERD (gastroesophageal reflux disease)   . Gout   . HSV-1 (herpes simplex virus 1) infection    Acyclovir prn  . Hyperlipidemia   . Hypertension   . Joint pain    osteoarthritis by Xray- possible erosion of R 4th MCP,elevated uric  acid ,ANA +   . Morbid obesity ([place])   . OSA on CPAP   . Osteopenia   . PAF (paroxysmal atrial fibrillation) ([place])   . Pain management    Neurosurg: Dr Clydell Hakim  . Pulmonary HTN    Moderate by echo  2016. PASP 80mmHg  . TIA (transient ischemic attack)    remote history of TIA's early 2000    No past surgical history on file.  Family History  Problem Relation Age of Onset  . Arrhythmia Brother     Social History:  reports that she has never smoked. She has never used smokeless tobacco. She reports that she does not drink alcohol or use drugs.  Review of Systems:   Hypertension:  She is taking Zestoretic 20/25, low dose metoprolol  and amlodipine 5 mg. This has been managed by her PCP or nephrologist    Lipids: Triglycerides have been high Previously, now controlled on fenofibrate, LDL has been below 100 with Lipitor 40 mg   Lab Results  Component Value Date   CHOL 152 01/07/2016   HDL 32.20 (L) 01/07/2016   LDLCALC 84 01/07/2016   LDLDIRECT 97.2 05/31/2013   TRIG 175.0 (H) 01/07/2016   CHOLHDL 5 01/07/2016    Renal insufficiency: Her creatinine is Minimally increased, previously had been normal    Lab Results  Component Value Date   CREATININE 1.34 (H) 10/11/2016    She has hypothyroidism and is taking 137 g levothyroxine, Last TSH normal   Lab Results  Component Value Date   TSH 2.49 10/11/2016    Taking gabapentin 100 mg as needed for mild neuropathic symptoms  Diabetic foot exam  as of 10/17 shows normal monofilament sensation in the toes and plantar surfaces, no skin lesions or ulcers on the feet and normal pedal pulses    Examination:   BP 120/88   Pulse 89   Ht 5\' 1"  (1.549 m)   Wt 239 lb (108.4 kg)   SpO2 97%   BMI 45.16 kg/m   Body mass index is 45.16 kg/m.   No ankle edema present    ASSESSMENT/ PLAN:   Diabetes type 2 with obesity :   See history of present illness for detailed discussion of  current management, blood sugar patterns and problems identified  She is only on NPH insulin along with metformin  and low-dose Actos She did not bring her monitor but she thinks her blood sugars are usually well controlled, again checking mostly fasting readings She has done well with watching her portions and carbohydrates and weight is slightly better also However still not able to exercise much  HYPOTHYROID and: Well controlled  Recommendations: She would continue same regimen More readings after supper She will discuss her blood pressure and renal function with PCP and nephrologist   There are no Patient Instructions on file for this visit.   Mart Colpitts 10/21/2016,  2:59 PM

## 2016-11-02 DIAGNOSIS — I359 Nonrheumatic aortic valve disorder, unspecified: Secondary | ICD-10-CM | POA: Insufficient documentation

## 2016-11-02 DIAGNOSIS — Z01812 Encounter for preprocedural laboratory examination: Secondary | ICD-10-CM | POA: Diagnosis not present

## 2016-11-02 DIAGNOSIS — I350 Nonrheumatic aortic (valve) stenosis: Secondary | ICD-10-CM | POA: Insufficient documentation

## 2016-11-02 DIAGNOSIS — E119 Type 2 diabetes mellitus without complications: Secondary | ICD-10-CM | POA: Diagnosis not present

## 2016-11-05 DIAGNOSIS — Z9841 Cataract extraction status, right eye: Secondary | ICD-10-CM | POA: Diagnosis not present

## 2016-11-05 DIAGNOSIS — Z8669 Personal history of other diseases of the nervous system and sense organs: Secondary | ICD-10-CM | POA: Diagnosis not present

## 2016-11-05 DIAGNOSIS — B0052 Herpesviral keratitis: Secondary | ICD-10-CM | POA: Diagnosis not present

## 2016-11-05 DIAGNOSIS — Z7901 Long term (current) use of anticoagulants: Secondary | ICD-10-CM | POA: Diagnosis not present

## 2016-11-05 DIAGNOSIS — Z947 Corneal transplant status: Secondary | ICD-10-CM | POA: Diagnosis not present

## 2016-11-05 DIAGNOSIS — Z9889 Other specified postprocedural states: Secondary | ICD-10-CM | POA: Diagnosis not present

## 2016-11-05 DIAGNOSIS — Z961 Presence of intraocular lens: Secondary | ICD-10-CM | POA: Diagnosis not present

## 2016-11-05 DIAGNOSIS — H25812 Combined forms of age-related cataract, left eye: Secondary | ICD-10-CM | POA: Diagnosis not present

## 2016-11-05 DIAGNOSIS — E119 Type 2 diabetes mellitus without complications: Secondary | ICD-10-CM | POA: Diagnosis not present

## 2016-11-05 DIAGNOSIS — I4891 Unspecified atrial fibrillation: Secondary | ICD-10-CM | POA: Diagnosis not present

## 2016-11-11 ENCOUNTER — Ambulatory Visit: Payer: PPO | Admitting: Podiatry

## 2016-11-11 DIAGNOSIS — Z961 Presence of intraocular lens: Secondary | ICD-10-CM | POA: Diagnosis not present

## 2016-11-11 DIAGNOSIS — Z981 Arthrodesis status: Secondary | ICD-10-CM | POA: Diagnosis not present

## 2016-11-11 DIAGNOSIS — Z9989 Dependence on other enabling machines and devices: Secondary | ICD-10-CM | POA: Diagnosis not present

## 2016-11-11 DIAGNOSIS — Z8673 Personal history of transient ischemic attack (TIA), and cerebral infarction without residual deficits: Secondary | ICD-10-CM | POA: Diagnosis not present

## 2016-11-11 DIAGNOSIS — Y838 Other surgical procedures as the cause of abnormal reaction of the patient, or of later complication, without mention of misadventure at the time of the procedure: Secondary | ICD-10-CM | POA: Diagnosis not present

## 2016-11-11 DIAGNOSIS — X58XXXA Exposure to other specified factors, initial encounter: Secondary | ICD-10-CM | POA: Diagnosis not present

## 2016-11-11 DIAGNOSIS — I480 Paroxysmal atrial fibrillation: Secondary | ICD-10-CM | POA: Diagnosis not present

## 2016-11-11 DIAGNOSIS — E7800 Pure hypercholesterolemia, unspecified: Secondary | ICD-10-CM | POA: Diagnosis not present

## 2016-11-11 DIAGNOSIS — I350 Nonrheumatic aortic (valve) stenosis: Secondary | ICD-10-CM | POA: Diagnosis not present

## 2016-11-11 DIAGNOSIS — N183 Chronic kidney disease, stage 3 (moderate): Secondary | ICD-10-CM | POA: Diagnosis not present

## 2016-11-11 DIAGNOSIS — Z9841 Cataract extraction status, right eye: Secondary | ICD-10-CM | POA: Diagnosis not present

## 2016-11-11 DIAGNOSIS — Z794 Long term (current) use of insulin: Secondary | ICD-10-CM | POA: Diagnosis not present

## 2016-11-11 DIAGNOSIS — F419 Anxiety disorder, unspecified: Secondary | ICD-10-CM | POA: Diagnosis not present

## 2016-11-11 DIAGNOSIS — H1820 Unspecified corneal edema: Secondary | ICD-10-CM | POA: Diagnosis not present

## 2016-11-11 DIAGNOSIS — Z6841 Body Mass Index (BMI) 40.0 and over, adult: Secondary | ICD-10-CM | POA: Diagnosis not present

## 2016-11-11 DIAGNOSIS — E1122 Type 2 diabetes mellitus with diabetic chronic kidney disease: Secondary | ICD-10-CM | POA: Diagnosis not present

## 2016-11-11 DIAGNOSIS — Z947 Corneal transplant status: Secondary | ICD-10-CM | POA: Diagnosis not present

## 2016-11-11 DIAGNOSIS — H18331 Rupture in Descemet's membrane, right eye: Secondary | ICD-10-CM | POA: Diagnosis not present

## 2016-11-11 DIAGNOSIS — Z79899 Other long term (current) drug therapy: Secondary | ICD-10-CM | POA: Diagnosis not present

## 2016-11-11 DIAGNOSIS — Z887 Allergy status to serum and vaccine status: Secondary | ICD-10-CM | POA: Diagnosis not present

## 2016-11-11 DIAGNOSIS — I129 Hypertensive chronic kidney disease with stage 1 through stage 4 chronic kidney disease, or unspecified chronic kidney disease: Secondary | ICD-10-CM | POA: Diagnosis not present

## 2016-11-11 DIAGNOSIS — E785 Hyperlipidemia, unspecified: Secondary | ICD-10-CM | POA: Diagnosis not present

## 2016-11-11 DIAGNOSIS — T86841 Corneal transplant failure: Secondary | ICD-10-CM | POA: Diagnosis not present

## 2016-11-11 DIAGNOSIS — E1140 Type 2 diabetes mellitus with diabetic neuropathy, unspecified: Secondary | ICD-10-CM | POA: Diagnosis not present

## 2016-11-11 DIAGNOSIS — E039 Hypothyroidism, unspecified: Secondary | ICD-10-CM | POA: Diagnosis not present

## 2016-11-12 DIAGNOSIS — H0289 Other specified disorders of eyelid: Secondary | ICD-10-CM | POA: Diagnosis not present

## 2016-11-12 DIAGNOSIS — E1140 Type 2 diabetes mellitus with diabetic neuropathy, unspecified: Secondary | ICD-10-CM | POA: Diagnosis not present

## 2016-11-12 DIAGNOSIS — Z887 Allergy status to serum and vaccine status: Secondary | ICD-10-CM | POA: Diagnosis not present

## 2016-11-12 DIAGNOSIS — Z9841 Cataract extraction status, right eye: Secondary | ICD-10-CM | POA: Diagnosis not present

## 2016-11-12 DIAGNOSIS — Z48810 Encounter for surgical aftercare following surgery on the sense organs: Secondary | ICD-10-CM | POA: Diagnosis not present

## 2016-11-12 DIAGNOSIS — Z794 Long term (current) use of insulin: Secondary | ICD-10-CM | POA: Diagnosis not present

## 2016-11-12 DIAGNOSIS — E039 Hypothyroidism, unspecified: Secondary | ICD-10-CM | POA: Diagnosis not present

## 2016-11-12 DIAGNOSIS — Z882 Allergy status to sulfonamides status: Secondary | ICD-10-CM | POA: Diagnosis not present

## 2016-11-12 DIAGNOSIS — H02409 Unspecified ptosis of unspecified eyelid: Secondary | ICD-10-CM | POA: Diagnosis not present

## 2016-11-12 DIAGNOSIS — I10 Essential (primary) hypertension: Secondary | ICD-10-CM | POA: Diagnosis not present

## 2016-11-12 DIAGNOSIS — H21261 Iris atrophy (essential) (progressive), right eye: Secondary | ICD-10-CM | POA: Diagnosis not present

## 2016-11-12 DIAGNOSIS — I4891 Unspecified atrial fibrillation: Secondary | ICD-10-CM | POA: Diagnosis not present

## 2016-11-12 DIAGNOSIS — B0052 Herpesviral keratitis: Secondary | ICD-10-CM | POA: Diagnosis not present

## 2016-11-12 DIAGNOSIS — Z961 Presence of intraocular lens: Secondary | ICD-10-CM | POA: Diagnosis not present

## 2016-11-12 DIAGNOSIS — Z947 Corneal transplant status: Secondary | ICD-10-CM | POA: Diagnosis not present

## 2016-11-12 DIAGNOSIS — Z79899 Other long term (current) drug therapy: Secondary | ICD-10-CM | POA: Diagnosis not present

## 2016-11-12 DIAGNOSIS — E7800 Pure hypercholesterolemia, unspecified: Secondary | ICD-10-CM | POA: Diagnosis not present

## 2016-11-12 DIAGNOSIS — Z8673 Personal history of transient ischemic attack (TIA), and cerebral infarction without residual deficits: Secondary | ICD-10-CM | POA: Diagnosis not present

## 2016-11-12 DIAGNOSIS — Z7901 Long term (current) use of anticoagulants: Secondary | ICD-10-CM | POA: Diagnosis not present

## 2016-11-12 DIAGNOSIS — E1136 Type 2 diabetes mellitus with diabetic cataract: Secondary | ICD-10-CM | POA: Diagnosis not present

## 2016-11-19 DIAGNOSIS — B0052 Herpesviral keratitis: Secondary | ICD-10-CM | POA: Diagnosis not present

## 2016-11-19 DIAGNOSIS — H0289 Other specified disorders of eyelid: Secondary | ICD-10-CM | POA: Diagnosis not present

## 2016-11-19 DIAGNOSIS — H02409 Unspecified ptosis of unspecified eyelid: Secondary | ICD-10-CM | POA: Diagnosis not present

## 2016-11-19 DIAGNOSIS — Z9889 Other specified postprocedural states: Secondary | ICD-10-CM | POA: Diagnosis not present

## 2016-11-19 DIAGNOSIS — Z7901 Long term (current) use of anticoagulants: Secondary | ICD-10-CM | POA: Diagnosis not present

## 2016-11-19 DIAGNOSIS — H40052 Ocular hypertension, left eye: Secondary | ICD-10-CM | POA: Diagnosis not present

## 2016-11-19 DIAGNOSIS — I4891 Unspecified atrial fibrillation: Secondary | ICD-10-CM | POA: Diagnosis not present

## 2016-11-19 DIAGNOSIS — Z882 Allergy status to sulfonamides status: Secondary | ICD-10-CM | POA: Diagnosis not present

## 2016-11-19 DIAGNOSIS — E7800 Pure hypercholesterolemia, unspecified: Secondary | ICD-10-CM | POA: Diagnosis not present

## 2016-11-19 DIAGNOSIS — Z887 Allergy status to serum and vaccine status: Secondary | ICD-10-CM | POA: Diagnosis not present

## 2016-11-19 DIAGNOSIS — E1140 Type 2 diabetes mellitus with diabetic neuropathy, unspecified: Secondary | ICD-10-CM | POA: Diagnosis not present

## 2016-11-19 DIAGNOSIS — Z48810 Encounter for surgical aftercare following surgery on the sense organs: Secondary | ICD-10-CM | POA: Diagnosis not present

## 2016-11-19 DIAGNOSIS — Z79899 Other long term (current) drug therapy: Secondary | ICD-10-CM | POA: Diagnosis not present

## 2016-11-19 DIAGNOSIS — Z794 Long term (current) use of insulin: Secondary | ICD-10-CM | POA: Diagnosis not present

## 2016-11-19 DIAGNOSIS — Z9841 Cataract extraction status, right eye: Secondary | ICD-10-CM | POA: Diagnosis not present

## 2016-11-19 DIAGNOSIS — E039 Hypothyroidism, unspecified: Secondary | ICD-10-CM | POA: Diagnosis not present

## 2016-11-19 DIAGNOSIS — Z961 Presence of intraocular lens: Secondary | ICD-10-CM | POA: Diagnosis not present

## 2016-11-19 DIAGNOSIS — Z947 Corneal transplant status: Secondary | ICD-10-CM | POA: Diagnosis not present

## 2016-11-19 DIAGNOSIS — E1136 Type 2 diabetes mellitus with diabetic cataract: Secondary | ICD-10-CM | POA: Diagnosis not present

## 2016-11-19 DIAGNOSIS — I10 Essential (primary) hypertension: Secondary | ICD-10-CM | POA: Diagnosis not present

## 2016-11-19 DIAGNOSIS — Z8673 Personal history of transient ischemic attack (TIA), and cerebral infarction without residual deficits: Secondary | ICD-10-CM | POA: Diagnosis not present

## 2016-11-23 ENCOUNTER — Ambulatory Visit (INDEPENDENT_AMBULATORY_CARE_PROVIDER_SITE_OTHER): Payer: PPO | Admitting: Podiatry

## 2016-11-23 ENCOUNTER — Ambulatory Visit (INDEPENDENT_AMBULATORY_CARE_PROVIDER_SITE_OTHER): Payer: PPO

## 2016-11-23 DIAGNOSIS — E1142 Type 2 diabetes mellitus with diabetic polyneuropathy: Secondary | ICD-10-CM

## 2016-11-23 DIAGNOSIS — L600 Ingrowing nail: Secondary | ICD-10-CM | POA: Diagnosis not present

## 2016-11-23 DIAGNOSIS — E119 Type 2 diabetes mellitus without complications: Secondary | ICD-10-CM

## 2016-11-23 DIAGNOSIS — M898X9 Other specified disorders of bone, unspecified site: Secondary | ICD-10-CM | POA: Diagnosis not present

## 2016-11-23 MED ORDER — NEOMYCIN-POLYMYXIN-HC 1 % OT SOLN: OTIC | 1 refills | Status: DC

## 2016-11-23 NOTE — Patient Instructions (Signed)

## 2016-11-23 NOTE — Progress Notes (Signed)
   Subjective:    Patient ID: Tina Hester, female    DOB: 1941-08-07, 76 y.o.   MRN: [id number]  HPI: She presents today with a chief complaint of painful ingrown toenail to the hallux right. She states that the left was really not doing too badly. She is also complaining of elongated painful toenails.    Review of Systems  HENT: Positive for hearing loss, sinus pressure and sneezing.   Eyes: Positive for visual disturbance.  Musculoskeletal: Positive for arthralgias.  Neurological: Positive for dizziness.  All other systems reviewed and are negative.      Objective:   Physical Exam: Vital signs stable alert and oriented 3. Pulses are palpable. Neurologic sensorium is intact. Degenerative flexors are intact. Muscle strength is 5 over 5 dorsiflexion plantar flexors and inverters everters all intrinsic musculature is intact. Orthopedic evaluation of his all joints distal to the ankle full range of motion without crepitation. Cutaneous evaluation and straight supple well-hydrated cutis no erythema or edema cellulitis drainage or odor with exception of sharp and rated now margins along the tibiofibular border hallux right. Toenails are long thick yellow dystrophic onychomycotic painful palpation as well as debridement.        Assessment & Plan:  Assessment: Ingrown nail paronychia abscess hallux right. Diabetes type 2 last hemoglobin A1c was 6.7. Pain in limb secondary to onychomycosis.  When: Discussed etiology pathology concerned versus surgical therapies chemical matrixectomy performed today she tolerated procedure well after local anesthesia was a [employer]. Follow-up with me in 1-2 weeks. She was provided with both oral and written home going instructions for the care and soaking of her toe as well as a prescription for Cortisporin otic be applied twice daily. Debridement of toenails 1 through 5 bilateral.

## 2016-11-27 ENCOUNTER — Other Ambulatory Visit: Payer: Self-pay | Admitting: Cardiology

## 2016-12-06 ENCOUNTER — Other Ambulatory Visit: Payer: Self-pay | Admitting: Endocrinology

## 2016-12-07 ENCOUNTER — Ambulatory Visit (INDEPENDENT_AMBULATORY_CARE_PROVIDER_SITE_OTHER): Payer: Self-pay | Admitting: Podiatry

## 2016-12-07 ENCOUNTER — Encounter: Payer: Self-pay | Admitting: Podiatry

## 2016-12-07 DIAGNOSIS — L600 Ingrowing nail: Secondary | ICD-10-CM

## 2016-12-07 NOTE — Progress Notes (Signed)
She presents today for follow-up of her matrixectomy hallux right. States is still just a little bit sore but continues to soak in Betadine and warm water.  Objective: Vital signs are stable she is alert and oriented 3 there is no erythema edema cellulitis drainage or odor. Small areas of scab are in the low medial and lateral borders which appear to be healing very nicely see no signs of infection no signs of delayed healing.  Assessment: Well-healing surgical toe hallux right.  Plan: Discontinue Betadine sterile Epsom salts and warm water soaks at least once a day until the soreness has resolved. Should any redness or drainage develops she will notify S immediately.

## 2016-12-07 NOTE — Patient Instructions (Signed)

## 2016-12-17 DIAGNOSIS — Z7901 Long term (current) use of anticoagulants: Secondary | ICD-10-CM | POA: Diagnosis not present

## 2016-12-17 DIAGNOSIS — H25812 Combined forms of age-related cataract, left eye: Secondary | ICD-10-CM | POA: Diagnosis not present

## 2016-12-17 DIAGNOSIS — E119 Type 2 diabetes mellitus without complications: Secondary | ICD-10-CM | POA: Diagnosis not present

## 2016-12-17 DIAGNOSIS — I4891 Unspecified atrial fibrillation: Secondary | ICD-10-CM | POA: Diagnosis not present

## 2016-12-17 DIAGNOSIS — Z9841 Cataract extraction status, right eye: Secondary | ICD-10-CM | POA: Diagnosis not present

## 2016-12-17 DIAGNOSIS — Z961 Presence of intraocular lens: Secondary | ICD-10-CM | POA: Diagnosis not present

## 2016-12-17 DIAGNOSIS — B0052 Herpesviral keratitis: Secondary | ICD-10-CM | POA: Diagnosis not present

## 2016-12-17 DIAGNOSIS — Z9889 Other specified postprocedural states: Secondary | ICD-10-CM | POA: Diagnosis not present

## 2017-01-10 ENCOUNTER — Other Ambulatory Visit: Payer: Self-pay | Admitting: Endocrinology

## 2017-01-14 ENCOUNTER — Other Ambulatory Visit: Payer: PPO

## 2017-01-18 ENCOUNTER — Ambulatory Visit: Payer: PPO | Admitting: Endocrinology

## 2017-01-18 DIAGNOSIS — Z0289 Encounter for other administrative examinations: Secondary | ICD-10-CM

## 2017-01-27 ENCOUNTER — Ambulatory Visit: Payer: PPO | Admitting: Cardiology

## 2017-03-14 ENCOUNTER — Telehealth: Payer: Self-pay | Admitting: *Deleted

## 2017-03-14 NOTE — Telephone Encounter (Signed)
Patient's appointment has been moved to Wednesday October 3 at 9:20 am from August 30 at 11:20 am. Patient has agreed to this appointment change. Patient states she wants to switch to an oral appliance in place of her CPAP. Patient states she will contact her doctor in [place] to fax her sleep study to Dr. Radford Pax. Patient was given fax [phone] to fax all information.

## 2017-03-18 ENCOUNTER — Other Ambulatory Visit: Payer: Self-pay | Admitting: Endocrinology

## 2017-03-21 ENCOUNTER — Other Ambulatory Visit: Payer: Self-pay

## 2017-04-08 ENCOUNTER — Other Ambulatory Visit: Payer: Self-pay | Admitting: Endocrinology

## 2017-04-28 ENCOUNTER — Ambulatory Visit: Payer: PPO | Admitting: Cardiology

## 2017-05-11 DIAGNOSIS — Z947 Corneal transplant status: Secondary | ICD-10-CM | POA: Diagnosis not present

## 2017-05-11 DIAGNOSIS — B0052 Herpesviral keratitis: Secondary | ICD-10-CM | POA: Diagnosis not present

## 2017-05-11 DIAGNOSIS — Z961 Presence of intraocular lens: Secondary | ICD-10-CM | POA: Diagnosis not present

## 2017-05-11 DIAGNOSIS — Z9841 Cataract extraction status, right eye: Secondary | ICD-10-CM | POA: Diagnosis not present

## 2017-05-17 ENCOUNTER — Other Ambulatory Visit: Payer: Self-pay | Admitting: Cardiology

## 2017-05-17 NOTE — Telephone Encounter (Signed)
Pt last saw Richardson Dopp, [place] 06/25/16, has upcoming appt with Dr Radford Pax on 06/01/17. Last labs 10/11/16 Creat 1.34, age 76, weight 108.4, based on specified criteria pt is on appropriate dosage of Eliquis 5mg  BID.  Will refill rx.

## 2017-05-27 ENCOUNTER — Other Ambulatory Visit: Payer: Self-pay | Admitting: Cardiology

## 2017-06-01 ENCOUNTER — Encounter: Payer: Self-pay | Admitting: Cardiology

## 2017-06-01 ENCOUNTER — Encounter (INDEPENDENT_AMBULATORY_CARE_PROVIDER_SITE_OTHER): Payer: Self-pay

## 2017-06-01 ENCOUNTER — Telehealth: Payer: Self-pay | Admitting: *Deleted

## 2017-06-01 ENCOUNTER — Ambulatory Visit (INDEPENDENT_AMBULATORY_CARE_PROVIDER_SITE_OTHER): Payer: PPO | Admitting: Cardiology

## 2017-06-01 VITALS — BP 126/50 | HR 61 | Ht 61.0 in | Wt 246.4 lb

## 2017-06-01 DIAGNOSIS — I6523 Occlusion and stenosis of bilateral carotid arteries: Secondary | ICD-10-CM | POA: Diagnosis not present

## 2017-06-01 DIAGNOSIS — Z9989 Dependence on other enabling machines and devices: Secondary | ICD-10-CM

## 2017-06-01 DIAGNOSIS — I480 Paroxysmal atrial fibrillation: Secondary | ICD-10-CM | POA: Diagnosis not present

## 2017-06-01 DIAGNOSIS — G4733 Obstructive sleep apnea (adult) (pediatric): Secondary | ICD-10-CM

## 2017-06-01 DIAGNOSIS — I10 Essential (primary) hypertension: Secondary | ICD-10-CM | POA: Diagnosis not present

## 2017-06-01 DIAGNOSIS — I272 Pulmonary hypertension, unspecified: Secondary | ICD-10-CM

## 2017-06-01 DIAGNOSIS — I350 Nonrheumatic aortic (valve) stenosis: Secondary | ICD-10-CM

## 2017-06-01 LAB — CBC WITH DIFFERENTIAL/PLATELET
BASOS: 0 %
Basophils Absolute: 0 10*3/uL (ref 0.0–0.2)
EOS (ABSOLUTE): 0.1 10*3/uL (ref 0.0–0.4)
EOS: 1 %
HEMATOCRIT: 32.8 % — AB (ref 34.0–46.6)
Hemoglobin: 11 g/dL — ABNORMAL LOW (ref 11.1–15.9)
IMMATURE GRANS (ABS): 0 10*3/uL (ref 0.0–0.1)
IMMATURE GRANULOCYTES: 0 %
LYMPHS: 33 %
Lymphocytes Absolute: 2.4 10*3/uL (ref 0.7–3.1)
MCH: 30.7 pg (ref 26.6–33.0)
MCHC: 33.5 g/dL (ref 31.5–35.7)
MCV: 92 fL (ref 79–97)
MONOCYTES: 12 %
Monocytes Absolute: 0.9 10*3/uL (ref 0.1–0.9)
NEUTROS PCT: 54 %
Neutrophils Absolute: 3.8 10*3/uL (ref 1.4–7.0)
PLATELETS: 196 10*3/uL (ref 150–379)
RBC: 3.58 x10E6/uL — ABNORMAL LOW (ref 3.77–5.28)
RDW: 14 % (ref 12.3–15.4)
WBC: 7.2 10*3/uL (ref 3.4–10.8)

## 2017-06-01 LAB — BASIC METABOLIC PANEL
BUN/Creatinine Ratio: 32 — ABNORMAL HIGH (ref 12–28)
BUN: 39 mg/dL — ABNORMAL HIGH (ref 8–27)
CALCIUM: 9 mg/dL (ref 8.7–10.3)
CO2: 19 mmol/L — AB (ref 20–29)
CREATININE: 1.22 mg/dL — AB (ref 0.57–1.00)
Chloride: 105 mmol/L (ref 96–106)
GFR calc Af Amer: 50 mL/min/{1.73_m2} — ABNORMAL LOW (ref >59)
GFR, EST NON AFRICAN AMERICAN: 43 mL/min/{1.73_m2} — AB (ref >59)
Glucose: 142 mg/dL — ABNORMAL HIGH (ref 65–99)
Potassium: 5.1 mmol/L (ref 3.5–5.2)
Sodium: 139 mmol/L (ref 134–144)

## 2017-06-01 MED ORDER — AMLODIPINE BESYLATE 5 MG PO TABS: 5.0000 mg | ORAL_TABLET | Freq: Every day | ORAL | 1 refills | Status: DC

## 2017-06-01 MED ORDER — ATORVASTATIN CALCIUM 40 MG PO TABS: 40.0000 mg | ORAL_TABLET | Freq: Every day | ORAL | 1 refills | Status: DC

## 2017-06-01 NOTE — Addendum Note (Signed)
Addended by: Katrine Coho on: 06/01/2017 10:46 AM   Modules accepted: Orders

## 2017-06-01 NOTE — Telephone Encounter (Addendum)
Per Dr Radford Pax...Marland KitchenMarland KitchenGet CPAP D/L from DME. Supply order....Marland KitchenMarland KitchenResmed Airfit N20 mask  Order faxed to [facility] @ [phone]

## 2017-06-01 NOTE — Progress Notes (Signed)
Cardiology Office Note:    Date:  06/01/2017   ID:  Tina Hester, DOB 10/08/1940, MRN [id number]  PCP:  Jonathon Jordan, MD  Cardiologist:  Fransico Him, MD   Referring MD: Jonathon Jordan, MD   Chief Complaint  Patient presents with  . Atrial Fibrillation  . Sleep Apnea  . Hypertension  . Aortic Stenosis    History of Present Illness:    Tina Hester is a 76 y.o. female with a hx of PAF, HTN, bilateral carotid artery stenosis and OSA on CPAP.  She is here today for followup and is doing well.  She denies any chest pain or pressure, SOB, DOE, PND, orthopnea, LE edema, dizziness or syncope. Occasionally she will notice her heart racing which resolved with an additional 1/2 tablet of Toprol.  She says that her afib is well controlled.  She is compliant with her meds and is tolerating meds with no SE.  She is doing well with her CPAP device and thinks that her has gotten used to it.  She tolerates the mask and feels the pressure is adequate.  Since going on CPAP she feels rested in the am and has no significant daytime sleepiness.  She denies any significant mouth or nasal dryness or nasal congestion.  She does not think that she snores.     Past Medical History:  Diagnosis Date  . Anemia    s/p Heme work -up normal EGD and colonscopy in 2012 per pt,neg SPEP  . Anxiety   . Aortic stenosis    mild to moderate by echo 2016  . Bilateral carotid artery stenosis 06/08/2015   1-39% bilateral  . CKD (chronic kidney disease), stage III ([place])   . Diabetes mellitus without complication ([place])    type 2  . GERD (gastroesophageal reflux disease)   . Gout   . HSV-1 (herpes simplex virus 1) infection    Acyclovir prn  . Hyperlipidemia   . Hypertension   . Joint pain    osteoarthritis by Xray- possible erosion of R 4th MCP,elevated uric  acid ,ANA +   . Morbid obesity ([place])   . OSA on CPAP   . Osteopenia   . PAF (paroxysmal atrial fibrillation) ([place])   . Pain management    Neurosurg: Dr Clydell Hakim  . Pulmonary HTN ([place])    Moderate by echo  2016. PASP 11mmHg  . TIA (transient ischemic attack)    remote history of TIA's early 2000    History reviewed. No pertinent surgical history.  Current Medications: Current Meds  Medication Sig  . acyclovir (ZOVIRAX) 400 MG tablet Take 400 mg by mouth 2 (two) times daily.   Marland Kitchen ALPRAZolam (XANAX) 0.25 MG tablet Take 0.25 mg by mouth at bedtime as needed for anxiety.  Marland Kitchen amLODipine (NORVASC) 5 MG tablet TAKE 1 TABLET BY MOUTH EVERY DAY  . atorvastatin (LIPITOR) 40 MG tablet Take 1 tablet (40 mg total) by mouth daily at 6 PM.  . CALCIUM PO Take 1 tablet by mouth daily.   . Calcium Polycarbophil 625 MG CHEW Chew by mouth.  . COLCRYS 0.6 MG tablet Take 0.6 mg by mouth daily as needed (gout).   Marland Kitchen ELIQUIS 5 MG TABS tablet TAKE 1 TABLET(5 MG) BY MOUTH TWICE DAILY  . Ergocalciferol (VITAMIN D2) 2000 units TABS Take 2,000 Units by mouth daily.   Marland Kitchen erythromycin ophthalmic ointment nightly.  . gabapentin (NEURONTIN) 100 MG capsule Take 100 mg by mouth daily.  Marland Kitchen glucose blood (  FREESTYLE LITE) test strip Use to test blood sugar twice daily. Dx: E11.9  . insulin NPH Human (HUMULIN N) 100 UNIT/ML injection Inject 20-45 Units into the skin See admin instructions. INJECT 20 UNITS EVERY MORNING AND 45 UNITS EVERY NIGHT   . insulin regular (NOVOLIN R RELION) 100 units/mL injection Inject 0.06-0.1 mLs (6-10 Units total) into the skin 3 (three) times daily before meals. (Patient taking differently: Inject 6-10 Units into the skin 2 (two) times daily before a meal. )  . Insulin Syringe-Needle U-100 (INSULIN SYRINGE .5CC/30GX5/16") 30G X 5/16" 0.5 ML MISC Use 2 needles per day to inject insulin  . levothyroxine (SYNTHROID, LEVOTHROID) 137 MCG tablet Take 137 mcg by mouth daily before breakfast.   . lisinopril-hydrochlorothiazide (PRINZIDE,ZESTORETIC) 20-25 MG per tablet Take 1 tablet by mouth daily.  . magnesium 30 MG tablet Take 30 mg by  mouth 2 (two) times daily.  . Magnesium Gluconate 550 MG TABS Take 30 mg by mouth.  . metFORMIN (GLUCOPHAGE) 500 MG tablet TAKE 1 TABLET BY MOUTH EVERY MORNING AND 2 TABLETS EVERY EVENING  . metoprolol succinate (TOPROL-XL) 25 MG 24 hr tablet TAKE 1 TABLET BY MOUTH TWICE DAILY  . moxifloxacin (VIGAMOX) 0.5 % ophthalmic solution Place 1 drop into the right eye 4 times daily.  . Multiple Vitamin (MULTIVITAMIN) capsule Take 1 capsule by mouth daily. Reported on 01/14/2016  . NEEDLE, DISP, 30 G (BD DISP NEEDLES) 30G X 1/2" MISC 1 each by Does not apply route 2 (two) times daily before a meal.  . NEOMYCIN-POLYMYXIN-HYDROCORTISONE (CORTISPORIN) 1 % SOLN otic solution Apply 1-2 drops to toe BID after soaking  . ONETOUCH DELICA LANCETS 25Z MISC Use to check blood sugars once daily  . pioglitazone (ACTOS) 15 MG tablet TAKE 1 TABLET BY MOUTH EVERY DAY  . pioglitazone (ACTOS) 15 MG tablet TAKE 1 TABLET BY MOUTH EVERY DAY  . timolol (TIMOPTIC) 0.5 % ophthalmic solution USE 1 DROP IN RIGHT EYE D  . vitamin B-12 (CYANOCOBALAMIN) 1000 MCG tablet Take 1,000 mcg by mouth daily.     Allergies:   Pneumococcal vaccine; Pneumovax 23 [pneumococcal vac polyvalent]; Other; and Sulfa antibiotics   Social History   Social History  . Marital status: Unknown    Spouse name: N/A  . Number of children: N/A  . Years of education: N/A   Social History Main Topics  . Smoking status: Never Smoker  . Smokeless tobacco: Never Used  . Alcohol use No  . Drug use: No  . Sexual activity: Not Asked   Other Topics Concern  . None   Social History Narrative  . None     Family History: The patient's family history includes Arrhythmia in her brother.  ROS:   Please see the history of present illness.    ROS  All other systems reviewed and negative.   EKGs/Labs/Other Studies Reviewed:    The following studies were reviewed today: CPAP donwload  EKG:  EKG is  ordered today.  The ekg ordered today demonstrates  NSR at 61bpm with ILBB  Recent Labs: 10/11/2016: BUN 36; Creatinine, Ser 1.34; Potassium 4.6; Sodium 138; TSH 2.49   Recent Lipid Panel    Component Value Date/Time   CHOL 132 10/19/2016   TRIG 258 (A) 10/19/2016   HDL 28 (A) 10/19/2016   CHOLHDL 5 01/07/2016 0951   VLDL 35.0 01/07/2016 0951   LDLCALC 53 10/19/2016   LDLDIRECT 97.2 05/31/2013 0942    Physical Exam:    VS:  BP Marland Kitchen)  126/50   Pulse 61   Ht 5\' 1"  (1.549 m)   Wt 246 lb 6.4 oz (111.8 kg)   SpO2 98%   BMI 46.56 kg/m     Wt Readings from Last 3 Encounters:  06/01/17 246 lb 6.4 oz (111.8 kg)  10/21/16 239 lb (108.4 kg)  09/23/16 242 lb (109.8 kg)     GEN:  Well nourished, well developed in no acute distress HEENT: Normal NECK: No JVD; Bilateral carotid bruits LYMPHATICS: No lymphadenopathy CARDIAC: RRR, no rubs, gallops.  2/6 SM at RUSB to LLSB RESPIRATORY:  Clear to auscultation without rales, wheezing or rhonchi  ABDOMEN: Soft, non-tender, non-distended MUSCULOSKELETAL:  No edema; No deformity  SKIN: Warm and dry NEUROLOGIC:  Alert and oriented x 3 PSYCHIATRIC:  Normal affect   ASSESSMENT:    1. PAF (paroxysmal atrial fibrillation) ([place])   2. Pulmonary HTN ([place])   3. Bilateral carotid artery stenosis   4. Nonrheumatic aortic valve stenosis   5. Essential hypertension   6. OSA on CPAP    PLAN:    In order of problems listed above:  1.  Paroxysmal atrial fibrillation - she is maintaining NSR.  She will continue on Eliquis 5mg  daily and Toprol XL 25mg  daily.  I will check a BMET and CBC today.  She denies any problems with excessive bleeding.   2.  Pulmonary HTN - moderate with PASP 54mmHg by echo 05/2016.  Likely Group 2 related to pulmonary venous HTN from diastolic dysfunction and elevated left heart pressure as well as Group 3 OSA.  I will repeat echo to make sure PAP are stable.   3.  Bilateral carotid artery stenosis (1-39% by dopplers 2016) - repeat carotid dopplers.  Continue statin.  No ASA  due to DOAC.    4.  Mild aortic stenosis - mild by echo 05/2016.   5.  HTN- she will continue on amlodipine 5mg  daily, Toprol XL 25mg  daily, Lisinopril HCT 20-25mg  daily.  I will check a BMET.  6.  OSA - the patient is tolerating PAP therapy well without any problems. I will get a download from her DME. The patient has been using and benefiting from CPAP use and will continue to benefit from therapy.       Medication Adjustments/Labs and Tests Ordered: Current medicines are reviewed at length with the patient today.  Concerns regarding medicines are outlined above.  No orders of the defined types were placed in this encounter.  No orders of the defined types were placed in this encounter.   Signed, Fransico Him, MD  06/01/2017 9:52 AM    [facility]

## 2017-06-01 NOTE — Patient Instructions (Signed)
Medication Instructions:  Your physician recommends that you continue on your current medications as directed. Please refer to the Current Medication list given to you today.   Labwork: BMET/CBCd today  Testing/Procedures: Your physician has requested that you have an echocardiogram. Echocardiography is a painless test that uses sound waves to create images of your heart. It provides your doctor with information about the size and shape of your heart and how well your heart's chambers and valves are working. This procedure takes approximately one hour. There are no restrictions for this procedure.  Your physician has requested that you have a carotid duplex. This test is an ultrasound of the carotid arteries in your neck. It looks at blood flow through these arteries that supply the brain with blood. Allow one hour for this exam. There are no restrictions or special instructions.     Follow-Up: Your physician wants you to follow-up in: 6 months with Dr Radford Pax. (April 2019). You will receive a reminder letter in the mail two months in advance. If you don't receive a letter, please call our office to schedule the follow-up appointment.        If you need a refill on your cardiac medications before your next appointment, please call your pharmacy.

## 2017-06-02 ENCOUNTER — Other Ambulatory Visit: Payer: Self-pay | Admitting: Cardiology

## 2017-06-02 DIAGNOSIS — I6523 Occlusion and stenosis of bilateral carotid arteries: Secondary | ICD-10-CM

## 2017-06-03 ENCOUNTER — Telehealth: Payer: Self-pay

## 2017-06-03 DIAGNOSIS — I10 Essential (primary) hypertension: Secondary | ICD-10-CM

## 2017-06-03 MED ORDER — LISINOPRIL 20 MG PO TABS: 20.0000 mg | ORAL_TABLET | Freq: Every day | ORAL | 3 refills | Status: DC

## 2017-06-03 MED ORDER — HYDROCHLOROTHIAZIDE 12.5 MG PO CAPS: 12.5000 mg | ORAL_CAPSULE | Freq: Every day | ORAL | 3 refills | Status: DC

## 2017-06-03 NOTE — Telephone Encounter (Addendum)
Return call: Raven called back to say [facility] can not send download until they have the patient's sleep study. CPAP assistant spoke to patient and she informed me that Elzie Rings in [place] was the doctor that ordered the sleep study and could be reached at [phone]. CPAP assistant reached out to [facility] spoke to medical records, sent the release of information form to fax # [phone] and is awaiting the sleep study for patient.

## 2017-06-03 NOTE — Telephone Encounter (Signed)
Notes recorded by Sueanne Margarita, MD on 06/02/2017 at 9:47 AM EDT Stop Prinizide and start Lisinopril 20mg  daily and HCTZ 12.5mg  daily and repeat BMET in 1 week   Spoke to patient and informed her of medication change and scheduled lab work.

## 2017-06-03 NOTE — Telephone Encounter (Signed)
Tiffany at [facility] will fax CPAP download for patient.

## 2017-06-06 ENCOUNTER — Encounter (HOSPITAL_COMMUNITY): Payer: Self-pay | Admitting: *Deleted

## 2017-06-06 ENCOUNTER — Ambulatory Visit (HOSPITAL_COMMUNITY): Payer: PPO | Attending: Internal Medicine

## 2017-06-06 ENCOUNTER — Other Ambulatory Visit: Payer: Self-pay

## 2017-06-06 DIAGNOSIS — E1122 Type 2 diabetes mellitus with diabetic chronic kidney disease: Secondary | ICD-10-CM | POA: Diagnosis not present

## 2017-06-06 DIAGNOSIS — E785 Hyperlipidemia, unspecified: Secondary | ICD-10-CM | POA: Insufficient documentation

## 2017-06-06 DIAGNOSIS — I129 Hypertensive chronic kidney disease with stage 1 through stage 4 chronic kidney disease, or unspecified chronic kidney disease: Secondary | ICD-10-CM | POA: Insufficient documentation

## 2017-06-06 DIAGNOSIS — G459 Transient cerebral ischemic attack, unspecified: Secondary | ICD-10-CM | POA: Diagnosis not present

## 2017-06-06 DIAGNOSIS — I272 Pulmonary hypertension, unspecified: Secondary | ICD-10-CM

## 2017-06-06 DIAGNOSIS — I350 Nonrheumatic aortic (valve) stenosis: Secondary | ICD-10-CM

## 2017-06-06 DIAGNOSIS — Z6841 Body Mass Index (BMI) 40.0 and over, adult: Secondary | ICD-10-CM | POA: Diagnosis not present

## 2017-06-06 DIAGNOSIS — G4733 Obstructive sleep apnea (adult) (pediatric): Secondary | ICD-10-CM | POA: Diagnosis not present

## 2017-06-06 DIAGNOSIS — Z9989 Dependence on other enabling machines and devices: Secondary | ICD-10-CM

## 2017-06-06 DIAGNOSIS — I10 Essential (primary) hypertension: Secondary | ICD-10-CM | POA: Diagnosis not present

## 2017-06-06 DIAGNOSIS — I6523 Occlusion and stenosis of bilateral carotid arteries: Secondary | ICD-10-CM | POA: Diagnosis not present

## 2017-06-06 DIAGNOSIS — I480 Paroxysmal atrial fibrillation: Secondary | ICD-10-CM | POA: Diagnosis not present

## 2017-06-06 DIAGNOSIS — N189 Chronic kidney disease, unspecified: Secondary | ICD-10-CM | POA: Diagnosis not present

## 2017-06-06 NOTE — Progress Notes (Signed)
Patient is in Atrial Fibrillation during today's echo, she is not symptomatic although she did complain of a dizzy spell that lasted about 5 seconds when laying down for the exam.  (says she gets those every now and again).  On comparison to previous echos, images appear similar, although they all appear to be technically difficult without the use of Definity in the past.  When given the option to use definity the patient is very hesitant and anxious about it.  I explained that it is not a dye/contrast but helps images however she is still resistant.  Because she is in atrial fibrillation and an Ejection Fraction wont be accurate anyways, she agrees to see what Dr. Radford Pax thinks and would return for Definity if needed once not in a-fib.    Deliah Boston, RDCS

## 2017-06-07 ENCOUNTER — Encounter: Payer: Self-pay | Admitting: Cardiology

## 2017-06-07 ENCOUNTER — Telehealth: Payer: Self-pay | Admitting: Cardiology

## 2017-06-07 NOTE — Progress Notes (Signed)
Pt returned call.  Pt agreeable to be seen in Afib clinic.  Advised pt that I would send message to Afib clinic and have them contact her with an appt.  Pt verbalized understanding and was appreciative for call.

## 2017-06-07 NOTE — Progress Notes (Signed)
Called patient back. Left message for patient to call back.

## 2017-06-07 NOTE — Telephone Encounter (Signed)
New Message ° ° pt verbalized that she is returning call for rn °

## 2017-06-07 NOTE — Progress Notes (Signed)
Please get appt with afib clinic - she was back in afib at echo

## 2017-06-08 ENCOUNTER — Ambulatory Visit (HOSPITAL_COMMUNITY)
Admission: RE | Admit: 2017-06-08 | Discharge: 2017-06-08 | Disposition: A | Discharge status: Home / Self Care | Payer: PPO | Source: Ambulatory Visit | Attending: Internal Medicine | Admitting: Internal Medicine

## 2017-06-08 DIAGNOSIS — I6523 Occlusion and stenosis of bilateral carotid arteries: Secondary | ICD-10-CM | POA: Insufficient documentation

## 2017-06-09 NOTE — Progress Notes (Signed)
Patient requested appt for 10/24.

## 2017-06-10 ENCOUNTER — Other Ambulatory Visit: Payer: PPO | Admitting: *Deleted

## 2017-06-10 DIAGNOSIS — I10 Essential (primary) hypertension: Secondary | ICD-10-CM

## 2017-06-11 LAB — BASIC METABOLIC PANEL
BUN / CREAT RATIO: 24 (ref 12–28)
BUN: 28 mg/dL — AB (ref 8–27)
CHLORIDE: 104 mmol/L (ref 96–106)
CO2: 21 mmol/L (ref 20–29)
Calcium: 9.4 mg/dL (ref 8.7–10.3)
Creatinine, Ser: 1.15 mg/dL — ABNORMAL HIGH (ref 0.57–1.00)
GFR calc non Af Amer: 46 mL/min/{1.73_m2} — ABNORMAL LOW (ref >59)
GFR, EST AFRICAN AMERICAN: 53 mL/min/{1.73_m2} — AB (ref >59)
GLUCOSE: 257 mg/dL — AB (ref 65–99)
POTASSIUM: 5 mmol/L (ref 3.5–5.2)
SODIUM: 140 mmol/L (ref 134–144)

## 2017-06-13 NOTE — Telephone Encounter (Signed)
Received a call from [place] at [facility] stating the second sleep study faxed over was another CPAP Titration and it can not be used to get supplies or another cpap for the patient.  Reached back out to the office of Elzie Rings to ask about records for a sleep study from 2007 and was informed that they had sent everything they had on the patient.  [facility] ([place]) says the patient will have to complete another sleep study before [facility] can help her. Patient has been notified.

## 2017-06-15 DIAGNOSIS — B0052 Herpesviral keratitis: Secondary | ICD-10-CM | POA: Diagnosis not present

## 2017-06-15 DIAGNOSIS — Z9841 Cataract extraction status, right eye: Secondary | ICD-10-CM | POA: Diagnosis not present

## 2017-06-15 DIAGNOSIS — Z961 Presence of intraocular lens: Secondary | ICD-10-CM | POA: Diagnosis not present

## 2017-06-15 DIAGNOSIS — Z947 Corneal transplant status: Secondary | ICD-10-CM | POA: Diagnosis not present

## 2017-06-17 ENCOUNTER — Encounter: Payer: Self-pay | Admitting: Cardiology

## 2017-06-17 NOTE — Telephone Encounter (Addendum)
Received a call from [place] at the [phone]) who states he has the original sleep study for the patient and will send it once he received the the information release form.  Patient release form has been faxed.  Confirmation of sleep study received.  All information needed has been faxed to [place] ([place]).

## 2017-06-22 ENCOUNTER — Ambulatory Visit (HOSPITAL_COMMUNITY)
Admission: RE | Admit: 2017-06-22 | Discharge: 2017-06-22 | Disposition: A | Discharge status: Home / Self Care | Payer: PPO | Source: Ambulatory Visit | Attending: Nurse Practitioner | Admitting: Nurse Practitioner

## 2017-06-22 ENCOUNTER — Encounter (HOSPITAL_COMMUNITY): Payer: Self-pay | Admitting: Nurse Practitioner

## 2017-06-22 VITALS — BP 132/74 | HR 109 | Ht 61.0 in | Wt 246.0 lb

## 2017-06-22 DIAGNOSIS — M109 Gout, unspecified: Secondary | ICD-10-CM | POA: Insufficient documentation

## 2017-06-22 DIAGNOSIS — N183 Chronic kidney disease, stage 3 (moderate): Secondary | ICD-10-CM | POA: Insufficient documentation

## 2017-06-22 DIAGNOSIS — E785 Hyperlipidemia, unspecified: Secondary | ICD-10-CM | POA: Insufficient documentation

## 2017-06-22 DIAGNOSIS — K219 Gastro-esophageal reflux disease without esophagitis: Secondary | ICD-10-CM | POA: Insufficient documentation

## 2017-06-22 DIAGNOSIS — M8580 Other specified disorders of bone density and structure, unspecified site: Secondary | ICD-10-CM | POA: Diagnosis not present

## 2017-06-22 DIAGNOSIS — Z7901 Long term (current) use of anticoagulants: Secondary | ICD-10-CM | POA: Diagnosis not present

## 2017-06-22 DIAGNOSIS — Z794 Long term (current) use of insulin: Secondary | ICD-10-CM | POA: Insufficient documentation

## 2017-06-22 DIAGNOSIS — R9431 Abnormal electrocardiogram [ECG] [EKG]: Secondary | ICD-10-CM | POA: Diagnosis not present

## 2017-06-22 DIAGNOSIS — Z8673 Personal history of transient ischemic attack (TIA), and cerebral infarction without residual deficits: Secondary | ICD-10-CM | POA: Insufficient documentation

## 2017-06-22 DIAGNOSIS — F419 Anxiety disorder, unspecified: Secondary | ICD-10-CM | POA: Insufficient documentation

## 2017-06-22 DIAGNOSIS — Z8249 Family history of ischemic heart disease and other diseases of the circulatory system: Secondary | ICD-10-CM | POA: Diagnosis not present

## 2017-06-22 DIAGNOSIS — M1990 Unspecified osteoarthritis, unspecified site: Secondary | ICD-10-CM | POA: Insufficient documentation

## 2017-06-22 DIAGNOSIS — I129 Hypertensive chronic kidney disease with stage 1 through stage 4 chronic kidney disease, or unspecified chronic kidney disease: Secondary | ICD-10-CM | POA: Insufficient documentation

## 2017-06-22 DIAGNOSIS — I272 Pulmonary hypertension, unspecified: Secondary | ICD-10-CM | POA: Insufficient documentation

## 2017-06-22 DIAGNOSIS — I480 Paroxysmal atrial fibrillation: Secondary | ICD-10-CM | POA: Insufficient documentation

## 2017-06-22 DIAGNOSIS — Z887 Allergy status to serum and vaccine status: Secondary | ICD-10-CM | POA: Diagnosis not present

## 2017-06-22 DIAGNOSIS — E1122 Type 2 diabetes mellitus with diabetic chronic kidney disease: Secondary | ICD-10-CM | POA: Diagnosis not present

## 2017-06-22 DIAGNOSIS — Z7952 Long term (current) use of systemic steroids: Secondary | ICD-10-CM | POA: Diagnosis not present

## 2017-06-22 DIAGNOSIS — G4733 Obstructive sleep apnea (adult) (pediatric): Secondary | ICD-10-CM | POA: Insufficient documentation

## 2017-06-22 DIAGNOSIS — Z79899 Other long term (current) drug therapy: Secondary | ICD-10-CM | POA: Insufficient documentation

## 2017-06-22 DIAGNOSIS — I350 Nonrheumatic aortic (valve) stenosis: Secondary | ICD-10-CM | POA: Diagnosis not present

## 2017-06-22 DIAGNOSIS — Z882 Allergy status to sulfonamides status: Secondary | ICD-10-CM | POA: Diagnosis not present

## 2017-06-23 NOTE — Progress Notes (Signed)
Primary Care Physician: Jonathon Jordan, MD Referring Physician:Dr. Lachele Lievanos is a 76 y.o. female with a h/o paroxysmal afib for several years. She recently saw Dr. Radford Pax and was in [place] . She then had an echo 10/8 and was in afib. She is in afib again this am but had difficulty finding the clinic and states when she is nervous she is likely to go into afib.   She reports afib about every two weeks that last 1-2 hours. She was  surprised to find she was in afib this am. Sometimes she feels it and sometimes she doesn't. Taking an extra 1/2 tab metoprolol will help return to SR. She is on eliquis without bleeding issues..  Today, she denies symptoms of palpitations, chest pain, shortness of breath, orthopnea, PND, lower extremity edema, dizziness, presyncope, syncope, or neurologic sequela. The patient is tolerating medications without difficulties and is otherwise without complaint today.   Past Medical History:  Diagnosis Date  . Anemia    s/p Heme work -up normal EGD and colonscopy in 2012 per pt,neg SPEP  . Anxiety   . Aortic stenosis    very mild by echo 05/2017  . Bilateral carotid artery stenosis 06/08/2015   1-39% bilateral  . CKD (chronic kidney disease), stage III ([place])   . Diabetes mellitus without complication ([place])    type 2  . GERD (gastroesophageal reflux disease)   . Gout   . HSV-1 (herpes simplex virus 1) infection    Acyclovir prn  . Hyperlipidemia   . Hypertension   . Joint pain    osteoarthritis by Xray- possible erosion of R 4th MCP,elevated uric  acid ,ANA +   . Morbid obesity ([place])   . OSA on CPAP   . Osteopenia   . PAF (paroxysmal atrial fibrillation) ([place])   . Pain management    Neurosurg: Dr Clydell Hakim  . Pulmonary HTN ([place])    Moderate by echo  2016. PASP 39mmHg  . TIA (transient ischemic attack)    remote history of TIA's early 2000   No past surgical history on file.  Current Outpatient Prescriptions  Medication Sig Dispense  Refill  . acyclovir (ZOVIRAX) 400 MG tablet Take 400 mg by mouth 2 (two) times daily.     Marland Kitchen ALPRAZolam (XANAX) 0.25 MG tablet Take 0.25 mg by mouth at bedtime as needed for anxiety.    Marland Kitchen amLODipine (NORVASC) 5 MG tablet Take 1 tablet (5 mg total) by mouth daily. 90 tablet 1  . atorvastatin (LIPITOR) 40 MG tablet Take 1 tablet (40 mg total) by mouth daily at 6 PM. 90 tablet 1  . CALCIUM PO Take 1 tablet by mouth daily.     . Calcium Polycarbophil 625 MG CHEW Chew by mouth.    . COLCRYS 0.6 MG tablet Take 0.6 mg by mouth daily as needed (gout).     Marland Kitchen ELIQUIS 5 MG TABS tablet TAKE 1 TABLET(5 MG) BY MOUTH TWICE DAILY 180 tablet 1  . Ergocalciferol (VITAMIN D2) 2000 units TABS Take 2,000 Units by mouth daily.     Marland Kitchen erythromycin ophthalmic ointment nightly.    . gabapentin (NEURONTIN) 100 MG capsule Take 100 mg by mouth daily.    Marland Kitchen glucose blood (FREESTYLE LITE) test strip Use to test blood sugar twice daily. Dx: E11.9 200 each 1  . hydrochlorothiazide (MICROZIDE) 12.5 MG capsule Take 1 capsule (12.5 mg total) by mouth daily. 90 capsule 3  . insulin NPH Human (HUMULIN  N) 100 UNIT/ML injection Inject 20-45 Units into the skin See admin instructions. INJECT 20 UNITS EVERY MORNING AND 45 UNITS EVERY NIGHT     . insulin regular (NOVOLIN R RELION) 100 units/mL injection Inject 0.06-0.1 mLs (6-10 Units total) into the skin 3 (three) times daily before meals. (Patient taking differently: Inject 6-10 Units into the skin 2 (two) times daily before a meal. ) 10 mL 1  . Insulin Syringe-Needle U-100 (INSULIN SYRINGE .5CC/30GX5/16") 30G X 5/16" 0.5 ML MISC Use 2 needles per day to inject insulin 60 each 5  . levothyroxine (SYNTHROID, LEVOTHROID) 137 MCG tablet Take 137 mcg by mouth daily before breakfast.     . lisinopril (PRINIVIL,ZESTRIL) 20 MG tablet Take 1 tablet (20 mg total) by mouth daily. 90 tablet 3  . magnesium 30 MG tablet Take 30 mg by mouth 2 (two) times daily.    . Magnesium Gluconate 550 MG TABS Take  30 mg by mouth.    . metFORMIN (GLUCOPHAGE) 500 MG tablet TAKE 1 TABLET BY MOUTH EVERY MORNING AND 2 TABLETS EVERY EVENING 270 tablet 0  . metoprolol succinate (TOPROL-XL) 25 MG 24 hr tablet TAKE 1 TABLET BY MOUTH TWICE DAILY 180 tablet 2  . moxifloxacin (VIGAMOX) 0.5 % ophthalmic solution Place 1 drop into the right eye 4 times daily.    . Multiple Vitamin (MULTIVITAMIN) capsule Take 1 capsule by mouth daily. Reported on 01/14/2016    . NEEDLE, DISP, 30 G (BD DISP NEEDLES) 30G X 1/2" MISC 1 each by Does not apply route 2 (two) times daily before a meal. 100 each 5  . NEOMYCIN-POLYMYXIN-HYDROCORTISONE (CORTISPORIN) 1 % SOLN otic solution Apply 1-2 drops to toe BID after soaking 10 mL 1  . ONETOUCH DELICA LANCETS 60F MISC Use to check blood sugars once daily 50 each 3  . pioglitazone (ACTOS) 15 MG tablet TAKE 1 TABLET BY MOUTH EVERY DAY 90 tablet 0  . pioglitazone (ACTOS) 15 MG tablet TAKE 1 TABLET BY MOUTH EVERY DAY 90 tablet 0  . prednisoLONE acetate (PRED FORTE) 1 % ophthalmic suspension Place 2 drops into the right eye daily.  2  . timolol (TIMOPTIC) 0.5 % ophthalmic solution USE 1 DROP IN RIGHT EYE D  1  . vitamin B-12 (CYANOCOBALAMIN) 1000 MCG tablet Take 1,000 mcg by mouth daily.     No current facility-administered medications for this encounter.     Allergies  Allergen Reactions  . Pneumococcal Vaccine Anaphylaxis    weakness  . Pneumovax 23 [Pneumococcal Vac Polyvalent] Anaphylaxis    weakness  . Other Hives    Other reaction(s): Other (See Comments) Pneumonia  Vaccine- very ill  . Sulfa Antibiotics Hives    Social History   Social History  . Marital status: Unknown    Spouse name: N/A  . Number of children: N/A  . Years of education: N/A   Occupational History  . Not on file.   Social History Main Topics  . Smoking status: Never Smoker  . Smokeless tobacco: Never Used  . Alcohol use No  . Drug use: No  . Sexual activity: Not on file   Other Topics Concern  .  Not on file   Social History Narrative  . No narrative on file    Family History  Problem Relation Age of Onset  . Arrhythmia Brother     ROS- All systems are reviewed and negative except as per the HPI above  Physical Exam: Vitals:   06/22/17 1111  BP: 132/74  Pulse: (!) 109  Weight: 246 lb (111.6 kg)  Height: 5\' 1"  (1.549 m)   Wt Readings from Last 3 Encounters:  06/22/17 246 lb (111.6 kg)  06/01/17 246 lb 6.4 oz (111.8 kg)  10/21/16 239 lb (108.4 kg)    Labs: Lab Results  Component Value Date   NA 140 06/10/2017   K 5.0 06/10/2017   CL 104 06/10/2017   CO2 21 06/10/2017   GLUCOSE 257 (H) 06/10/2017   BUN 28 (H) 06/10/2017   CREATININE 1.15 (H) 06/10/2017   CALCIUM 9.4 06/10/2017   No results found for: INR Lab Results  Component Value Date   CHOL 132 10/19/2016   HDL 28 (A) 10/19/2016   LDLCALC 53 10/19/2016   TRIG 258 (A) 10/19/2016     GEN- The patient is well appearing, alert and oriented x 3 today.   Head- normocephalic, atraumatic Eyes-  Sclera clear, conjunctiva pink Ears- hearing intact Oropharynx- clear Neck- supple, no JVP Lymph- no cervical lymphadenopathy Lungs- Clear to ausculation bilaterally, normal work of breathing Heart- irregular rate and rhythm, no murmurs, rubs or gallops, PMI not laterally displaced GI- soft, NT, ND, + BS Extremities- no clubbing, cyanosis, or edema MS- no significant deformity or atrophy Skin- no rash or lesion Psych- euthymic mood, full affect Neuro- strength and sensation are intact  EKG-afib at 109 bpm, qrs int 96 ms, qtc 457 ms Epic records reviewed    Assessment and Plan: 1. Paroxysmal afib Currently pt feels that her burden is low, episodes are not very distressing to her when in afib, and taking extra 1/2 tab metoprolol will keep her episodes to 1-2 hours. She is not interested in pursing rhythm control at this time She will continue BB at current dose Continue eliquis5 mg bid for chadsvasc  score of at least 7  2. Sleep apnea Continue  cpap per Dr. Radford Pax  3. Pulmonary HTN Per Dr. Radford Pax  4. HTN Stable  Can see pt back as needed if afib burden increases and she is ready to change treatment  Butch Penny C. Carmelo Reidel, [facility] [address] [place], [place] [address] [phone]

## 2017-06-24 ENCOUNTER — Other Ambulatory Visit: Payer: Self-pay | Admitting: Endocrinology

## 2017-06-24 DIAGNOSIS — G4733 Obstructive sleep apnea (adult) (pediatric): Secondary | ICD-10-CM | POA: Diagnosis not present

## 2017-07-13 DIAGNOSIS — Z9841 Cataract extraction status, right eye: Secondary | ICD-10-CM | POA: Diagnosis not present

## 2017-07-13 DIAGNOSIS — Z961 Presence of intraocular lens: Secondary | ICD-10-CM | POA: Diagnosis not present

## 2017-07-13 DIAGNOSIS — B0052 Herpesviral keratitis: Secondary | ICD-10-CM | POA: Diagnosis not present

## 2017-07-13 DIAGNOSIS — Z947 Corneal transplant status: Secondary | ICD-10-CM | POA: Diagnosis not present

## 2017-08-05 ENCOUNTER — Other Ambulatory Visit: Payer: Self-pay | Admitting: Endocrinology

## 2017-08-17 DIAGNOSIS — Z947 Corneal transplant status: Secondary | ICD-10-CM | POA: Diagnosis not present

## 2017-08-17 DIAGNOSIS — Z9841 Cataract extraction status, right eye: Secondary | ICD-10-CM | POA: Diagnosis not present

## 2017-08-17 DIAGNOSIS — Z961 Presence of intraocular lens: Secondary | ICD-10-CM | POA: Diagnosis not present

## 2017-08-17 DIAGNOSIS — B0052 Herpesviral keratitis: Secondary | ICD-10-CM | POA: Diagnosis not present

## 2017-08-31 NOTE — Telephone Encounter (Signed)
This encounter was created in error - please disregard.

## 2017-09-23 DIAGNOSIS — Z947 Corneal transplant status: Secondary | ICD-10-CM | POA: Diagnosis not present

## 2017-09-23 DIAGNOSIS — Z9841 Cataract extraction status, right eye: Secondary | ICD-10-CM | POA: Diagnosis not present

## 2017-09-23 DIAGNOSIS — Z961 Presence of intraocular lens: Secondary | ICD-10-CM | POA: Diagnosis not present

## 2017-09-23 DIAGNOSIS — B0052 Herpesviral keratitis: Secondary | ICD-10-CM | POA: Diagnosis not present

## 2017-10-07 ENCOUNTER — Other Ambulatory Visit: Payer: Self-pay | Admitting: Endocrinology

## 2017-10-11 ENCOUNTER — Other Ambulatory Visit (INDEPENDENT_AMBULATORY_CARE_PROVIDER_SITE_OTHER): Payer: PPO

## 2017-10-11 DIAGNOSIS — Z794 Long term (current) use of insulin: Secondary | ICD-10-CM | POA: Diagnosis not present

## 2017-10-11 DIAGNOSIS — E1165 Type 2 diabetes mellitus with hyperglycemia: Secondary | ICD-10-CM | POA: Diagnosis not present

## 2017-10-11 LAB — COMPREHENSIVE METABOLIC PANEL
ALT: 13 U/L (ref 0–35)
AST: 14 U/L (ref 0–37)
Albumin: 3.7 g/dL (ref 3.5–5.2)
Alkaline Phosphatase: 71 U/L (ref 39–117)
BILIRUBIN TOTAL: 0.4 mg/dL (ref 0.2–1.2)
BUN: 44 mg/dL — ABNORMAL HIGH (ref 6–23)
CO2: 27 meq/L (ref 19–32)
CREATININE: 1.29 mg/dL — AB (ref 0.40–1.20)
Calcium: 9.5 mg/dL (ref 8.4–10.5)
Chloride: 105 mEq/L (ref 96–112)
GFR: 42.62 mL/min — AB (ref >60.00)
Glucose, Bld: 175 mg/dL — ABNORMAL HIGH (ref 70–99)
Potassium: 4.9 mEq/L (ref 3.5–5.1)
Sodium: 138 mEq/L (ref 135–145)
Total Protein: 6.7 g/dL (ref 6.0–8.3)

## 2017-10-11 LAB — LIPID PANEL
Cholesterol: 118 mg/dL (ref 0–200)
HDL: 27.8 mg/dL — ABNORMAL LOW (ref >39.00)
LDL CALC: 61 mg/dL (ref 0–99)
NonHDL: 90.63
Total CHOL/HDL Ratio: 4
Triglycerides: 147 mg/dL (ref 0.0–149.0)
VLDL: 29.4 mg/dL (ref 0.0–40.0)

## 2017-10-11 LAB — HEMOGLOBIN A1C: Hgb A1c MFr Bld: 7.6 % — ABNORMAL HIGH (ref 4.6–6.5)

## 2017-10-12 ENCOUNTER — Other Ambulatory Visit: Payer: Self-pay | Admitting: Endocrinology

## 2017-10-14 ENCOUNTER — Other Ambulatory Visit: Payer: PPO

## 2017-10-18 ENCOUNTER — Encounter: Payer: Self-pay | Admitting: Endocrinology

## 2017-10-18 ENCOUNTER — Ambulatory Visit: Payer: PPO | Admitting: Endocrinology

## 2017-10-18 VITALS — BP 130/74 | HR 64 | Ht 61.0 in | Wt 246.8 lb

## 2017-10-18 DIAGNOSIS — E1165 Type 2 diabetes mellitus with hyperglycemia: Secondary | ICD-10-CM | POA: Diagnosis not present

## 2017-10-18 DIAGNOSIS — Z794 Long term (current) use of insulin: Secondary | ICD-10-CM | POA: Diagnosis not present

## 2017-10-18 LAB — GLUCOSE, POCT (MANUAL RESULT ENTRY): POC GLUCOSE: 139 mg/dL — AB (ref 70–99)

## 2017-10-18 NOTE — Patient Instructions (Signed)
Check blood sugars on waking up 3/7   Also check blood sugars about 2 hours after a meal and do this after different meals by rotation  Recommended blood sugar levels on waking up is 90-130 and about 2 hours after meal is 130-160  Please bring your blood sugar monitor to each visit, thank you  

## 2017-10-18 NOTE — Progress Notes (Signed)
Patient ID: Tina Hester, female   DOB: 1940-09-13, 77 y.o.   MRN: [id number]   Reason for Appointment: Diabetes follow-up   History of Present Illness   Diagnosis: Type 2 DIABETES MELITUS, long-standing    Previous history:  She has been on insulin for a few years. Because of cost she has been switched from Lantus to NPH insulin twice a day She had previously been taking metformin but this was stopped when she had renal insufficiency Overall her blood sugars have been difficult to control and she has previously had relatively poor control. However with adding Actos her blood sugars had started improving significantly Also with her being very consistent with diet in 2015 her A1c had gone down to 6.4 To improve her control she was given a trial of the V-go pump in 2/17 but she had allergic skin reaction with the adhesive  Recent history:    Insulin regimen: Humulin N 20 in am, 25 HS   Oral hypoglycemic drugs: Actos 15 mg, Metformin 500 milligrams in a.m.-1000 mg   She has not been seen in follow-up for about a year  Her A1c is 7.6, previously 6.7  Current blood sugar patterns, management and problems identified:   She has not brought her monitor for download despite repeated reminders  Most likely she is not checking blood sugars much at all because of her busy lifestyle  She checks sporadic readings in the mornings on waking up  Today despite eating a fast food breakfast her blood sugars only 139, 2 hours later  She does not think her blood sugars are high at various times but is checking mostly in the mornings and only some late at night  Has previously been off her regular insulin but she thinks she would take this if eating a large meal or dessert  However difficult to assess her postprandial readings as she forgets to check these  However her weight is increased compared to her last visit  No recent hypoglycemia                 Side effects from  medications:She did not tolerate Invokana and was having muscle cramps  Monitors blood glucose: occasionally     Glucometer:  FreeStyle       Blood Glucose readings from recall:  Mean values apply above for all meters except median for One Touch  PRE-MEAL Fasting Lunch Dinner Bedtime Overall  Glucose range: 106-110   <169   Mean/median:         Meals: 2-3 meals per day. Dinner around 7-8 pm  Am: usually egg and toast at 11 am       Physical activity: exercise: She is generally active with doing housework, limited by knee pain     Wt Readings from Last 3 Encounters:  10/18/17 246 lb 12.8 oz (111.9 kg)  06/22/17 246 lb (111.6 kg)  06/01/17 246 lb 6.4 oz (111.8 kg)   LABS:6.7  Lab Results  Component Value Date   HGBA1C 7.6 (H) 10/11/2017   HGBA1C 6.7 10/21/2016   HGBA1C 7.1 (H) 06/10/2016   Lab Results  Component Value Date   MICROALBUR 2.4 (H) 10/21/2016   LDLCALC 61 10/11/2017   CREATININE 1.29 (H) 10/11/2017   No visits with results within 1 Week(s) from this visit.  Latest known visit with results is:  Lab on 10/11/2017  Component Date Value Ref Range Status  . Cholesterol 10/11/2017 118  0 - 200 mg/dL Final  ATP III Classification       Desirable:  < 200 mg/dL               Borderline High:  200 - 239 mg/dL          High:  > = 240 mg/dL  . Triglycerides 10/11/2017 147.0  0.0 - 149.0 mg/dL Final   Normal:  <150 mg/dLBorderline High:  150 - 199 mg/dL  . HDL 10/11/2017 27.80* >39.00 mg/dL Final  . VLDL 10/11/2017 29.4  0.0 - 40.0 mg/dL Final  . LDL Cholesterol 10/11/2017 61  0 - 99 mg/dL Final  . Total CHOL/HDL Ratio 10/11/2017 4   Final                  Men          Women1/2 Average Risk     3.4          3.3Average Risk          5.0          4.42X Average Risk          9.6          7.13X Average Risk          15.0          11.0                      . NonHDL 10/11/2017 90.63   Final   NOTE:  Non-HDL goal should be 30 mg/dL higher than patient's LDL goal (i.e. LDL  goal of < 70 mg/dL, would have non-HDL goal of < 100 mg/dL)  . Sodium 10/11/2017 138  135 - 145 mEq/L Final  . Potassium 10/11/2017 4.9  3.5 - 5.1 mEq/L Final  . Chloride 10/11/2017 105  96 - 112 mEq/L Final  . CO2 10/11/2017 27  19 - 32 mEq/L Final  . Glucose, Bld 10/11/2017 175* 70 - 99 mg/dL Final  . BUN 10/11/2017 44* 6 - 23 mg/dL Final  . Creatinine, Ser 10/11/2017 1.29* 0.40 - 1.20 mg/dL Final  . Total Bilirubin 10/11/2017 0.4  0.2 - 1.2 mg/dL Final  . Alkaline Phosphatase 10/11/2017 71  39 - 117 U/L Final  . AST 10/11/2017 14  0 - 37 U/L Final  . ALT 10/11/2017 13  0 - 35 U/L Final  . Total Protein 10/11/2017 6.7  6.0 - 8.3 g/dL Final  . Albumin 10/11/2017 3.7  3.5 - 5.2 g/dL Final  . Calcium 10/11/2017 9.5  8.4 - 10.5 mg/dL Final  . GFR 10/11/2017 42.62* >60.00 mL/min Final  . Hgb A1c MFr Bld 10/11/2017 7.6* 4.6 - 6.5 % Final   Glycemic Control Guidelines for People with Diabetes:Non Diabetic:  <6%Goal of Therapy: <7%Additional Action Suggested:  >8%      Allergies as of 10/18/2017      Reactions   Pneumococcal Vaccine Anaphylaxis   weakness   Pneumovax 23 [pneumococcal Vac Polyvalent] Anaphylaxis   weakness   Other Hives   Other reaction(s): Other (See Comments) Pneumonia  Vaccine- very ill   Sulfa Antibiotics Hives      Medication List        Accurate as of 10/18/17 10:51 AM. Always use your most recent med list.          acyclovir 400 MG tablet Commonly known as:  ZOVIRAX Take 400 mg by mouth 2 (two) times daily.   ALPRAZolam 0.25 MG tablet Commonly known as:  XANAX Take  0.25 mg by mouth at bedtime as needed for anxiety.   amLODipine 5 MG tablet Commonly known as:  NORVASC Take 1 tablet (5 mg total) by mouth daily.   atorvastatin 40 MG tablet Commonly known as:  LIPITOR Take 1 tablet (40 mg total) by mouth daily at 6 PM.   CALCIUM PO Take 1 tablet by mouth daily.   Calcium Polycarbophil 625 MG Chew Chew by mouth.   COLCRYS 0.6 MG tablet Generic  drug:  colchicine Take 0.6 mg by mouth daily as needed (gout).   ELIQUIS 5 MG Tabs tablet Generic drug:  apixaban TAKE 1 TABLET(5 MG) BY MOUTH TWICE DAILY   erythromycin ophthalmic ointment nightly.   gabapentin 100 MG capsule Commonly known as:  NEURONTIN Take 100 mg by mouth daily.   glucose blood test strip Commonly known as:  FREESTYLE LITE Use to test blood sugar twice daily. Dx: E11.9   HUMULIN N 100 UNIT/ML injection Generic drug:  insulin NPH Human Inject 20-45 Units into the skin See admin instructions. INJECT 20 UNITS EVERY MORNING AND 45 UNITS EVERY NIGHT   hydrochlorothiazide 12.5 MG capsule Commonly known as:  MICROZIDE Take 1 capsule (12.5 mg total) by mouth daily.   insulin regular 100 units/mL injection Commonly known as:  NOVOLIN R RELION Inject 0.06-0.1 mLs (6-10 Units total) into the skin 3 (three) times daily before meals.   INSULIN SYRINGE .5CC/30GX5/16" 30G X 5/16" 0.5 ML Misc Use 2 needles per day to inject insulin   levothyroxine 137 MCG tablet Commonly known as:  SYNTHROID, LEVOTHROID Take 137 mcg by mouth daily before breakfast.   lisinopril 20 MG tablet Commonly known as:  PRINIVIL,ZESTRIL Take 1 tablet (20 mg total) by mouth daily.   magnesium 30 MG tablet Take 30 mg by mouth 2 (two) times daily.   Magnesium Gluconate 550 MG Tabs Take 30 mg by mouth.   metFORMIN 500 MG tablet Commonly known as:  GLUCOPHAGE TAKE 1 TABLET BY MOUTH EVERY MORNING AND 2 TABLETS BY MOUTH EVERY EVENING   metoprolol succinate 25 MG 24 hr tablet Commonly known as:  TOPROL-XL TAKE 1 TABLET BY MOUTH TWICE DAILY   moxifloxacin 0.5 % ophthalmic solution Commonly known as:  VIGAMOX Place 1 drop into the right eye 4 times daily.   multivitamin capsule Take 1 capsule by mouth daily. Reported on 01/14/2016   NEEDLE (DISP) 30 G 30G X 1/2" Misc Commonly known as:  BD DISP NEEDLES 1 each by Does not apply route 2 (two) times daily before a meal.     NEOMYCIN-POLYMYXIN-HYDROCORTISONE 1 % Soln OTIC solution Commonly known as:  CORTISPORIN Apply 1-2 drops to toe BID after soaking   ONETOUCH DELICA LANCETS 83J Misc Use to check blood sugars once daily   pioglitazone 15 MG tablet Commonly known as:  ACTOS TAKE 1 TABLET BY MOUTH EVERY DAY   prednisoLONE acetate 1 % ophthalmic suspension Commonly known as:  PRED FORTE Place 2 drops into the right eye daily.   timolol 0.5 % ophthalmic solution Commonly known as:  TIMOPTIC USE 1 DROP IN RIGHT EYE D   vitamin B-12 1000 MCG tablet Commonly known as:  CYANOCOBALAMIN Take 1,000 mcg by mouth daily.   Vitamin D2 2000 units Tabs Take 2,000 Units by mouth daily.       Allergies:  Allergies  Allergen Reactions  . Pneumococcal Vaccine Anaphylaxis    weakness  . Pneumovax 23 [Pneumococcal Vac Polyvalent] Anaphylaxis    weakness  . Other Hives  Other reaction(s): Other (See Comments) Pneumonia  Vaccine- very ill  . Sulfa Antibiotics Hives    Past Medical History:  Diagnosis Date  . Anemia    s/p Heme work -up normal EGD and colonscopy in 2012 per pt,neg SPEP  . Anxiety   . Aortic stenosis    very mild by echo 05/2017  . Bilateral carotid artery stenosis 06/08/2015   1-39% bilateral  . CKD (chronic kidney disease), stage III ([place])   . Diabetes mellitus without complication ([place])    type 2  . GERD (gastroesophageal reflux disease)   . Gout   . HSV-1 (herpes simplex virus 1) infection    Acyclovir prn  . Hyperlipidemia   . Hypertension   . Joint pain    osteoarthritis by Xray- possible erosion of R 4th MCP,elevated uric  acid ,ANA +   . Morbid obesity ([place])   . OSA on CPAP   . Osteopenia   . PAF (paroxysmal atrial fibrillation) ([place])   . Pain management    Neurosurg: Dr Clydell Hakim  . Pulmonary HTN ([place])    Moderate by echo  2016. PASP 37mmHg  . TIA (transient ischemic attack)    remote history of TIA's early 2000    No past surgical history on  file.  Family History  Problem Relation Age of Onset  . Arrhythmia Brother     Social History:  reports that  has never smoked. she has never used smokeless tobacco. She reports that she does not drink alcohol or use drugs.  Review of Systems:   Hypertension:  She is taking Zestoretic 20/25, low dose metoprolol and amlodipine 5 mg. This has been managed by her PCP or nephrologist    Lipids: Triglycerides have been high Previously, now controlled off fenofibrate, LDL has been below 100 with Lipitor 40 mg   Lab Results  Component Value Date   CHOL 118 10/11/2017   HDL 27.80 (L) 10/11/2017   LDLCALC 61 10/11/2017   LDLDIRECT 97.2 05/31/2013   TRIG 147.0 10/11/2017   CHOLHDL 4 10/11/2017    Renal insufficiency: Her creatinine is Minimally increased as before   Lab Results  Component Value Date   CREATININE 1.29 (H) 10/11/2017    She has hypothyroidism and is taking 137 g levothyroxine She is due to follow-up with her PCP   Lab Results  Component Value Date   TSH 2.49 10/11/2016    Taking gabapentin 100 mg as needed for mild burning and tingling      Examination:   BP 130/74 (BP Location: Left Arm, Patient Position: Sitting, Cuff Size: Large)   Pulse 64   Ht 5\' 1"  (1.549 m)   Wt 246 lb 12.8 oz (111.9 kg)   SpO2 99%   BMI 46.63 kg/m   Body mass index is 46.63 kg/m.   No ankle edema present bilaterally    ASSESSMENT/ PLAN:   Diabetes type 2 with obesity, insulin requiring:   She has not been seen in follow-up for over a year  See history of present illness for detailed discussion of  current management, blood sugar patterns and issues identified  She is again only on NPH insulin along with metformin and low-dose Actos   Still reportedly has had good readings at home but her A1c is significantly higher at 7.6 compared to last year Most likely she had not been consistent with her diet until recently Difficult to know what her postprandial readings  are She tends to forget tocheck her  sugars regularly especially after meals at night   She did not bring her monitor again and reminded her to do so regularly She still has difficulty with weight and has gained some from last year   HYPOTHYROIDISM: She needs to be followed up by her PCP, no recent labs available   Recommendations: She would continue same regimen of insulin and metformin More readings after various meals especially supper We will review her blood sugars on the next visit and also recheck A1c    Patient Instructions  Check blood sugars on waking up 3/7   Also check blood sugars about 2 hours after a meal and do this after different meals by rotation  Recommended blood sugar levels on waking up is 90-130 and about 2 hours after meal is 130-160  Please bring your blood sugar monitor to each visit, thank you     Elayne Snare 10/18/2017, 10:51 AM

## 2017-11-02 DIAGNOSIS — Z961 Presence of intraocular lens: Secondary | ICD-10-CM | POA: Diagnosis not present

## 2017-11-02 DIAGNOSIS — Z9841 Cataract extraction status, right eye: Secondary | ICD-10-CM | POA: Diagnosis not present

## 2017-11-02 DIAGNOSIS — B0052 Herpesviral keratitis: Secondary | ICD-10-CM | POA: Diagnosis not present

## 2017-11-02 DIAGNOSIS — Z947 Corneal transplant status: Secondary | ICD-10-CM | POA: Diagnosis not present

## 2017-11-05 ENCOUNTER — Other Ambulatory Visit: Payer: Self-pay | Admitting: Endocrinology

## 2017-11-15 ENCOUNTER — Other Ambulatory Visit: Payer: Self-pay | Admitting: Endocrinology

## 2017-11-15 ENCOUNTER — Encounter: Payer: Self-pay | Admitting: Endocrinology

## 2017-11-15 DIAGNOSIS — E039 Hypothyroidism, unspecified: Secondary | ICD-10-CM | POA: Diagnosis not present

## 2017-11-15 DIAGNOSIS — E1121 Type 2 diabetes mellitus with diabetic nephropathy: Secondary | ICD-10-CM | POA: Diagnosis not present

## 2017-11-15 DIAGNOSIS — I4891 Unspecified atrial fibrillation: Secondary | ICD-10-CM | POA: Diagnosis not present

## 2017-11-15 DIAGNOSIS — Z794 Long term (current) use of insulin: Secondary | ICD-10-CM | POA: Diagnosis not present

## 2017-11-15 DIAGNOSIS — F322 Major depressive disorder, single episode, severe without psychotic features: Secondary | ICD-10-CM | POA: Diagnosis not present

## 2017-11-15 DIAGNOSIS — Z1211 Encounter for screening for malignant neoplasm of colon: Secondary | ICD-10-CM | POA: Diagnosis not present

## 2017-11-15 DIAGNOSIS — Z0000 Encounter for general adult medical examination without abnormal findings: Secondary | ICD-10-CM | POA: Diagnosis not present

## 2017-11-15 DIAGNOSIS — N183 Chronic kidney disease, stage 3 (moderate): Secondary | ICD-10-CM | POA: Diagnosis not present

## 2017-11-15 DIAGNOSIS — Z6841 Body Mass Index (BMI) 40.0 and over, adult: Secondary | ICD-10-CM | POA: Diagnosis not present

## 2017-11-15 DIAGNOSIS — D692 Other nonthrombocytopenic purpura: Secondary | ICD-10-CM | POA: Diagnosis not present

## 2017-11-15 DIAGNOSIS — Z79899 Other long term (current) drug therapy: Secondary | ICD-10-CM | POA: Diagnosis not present

## 2017-11-17 LAB — CBC AND DIFFERENTIAL
HCT: 35 — AB (ref 36–46)
Hemoglobin: 11.2 — AB (ref 12.0–16.0)
WBC: 7.1

## 2017-11-30 DIAGNOSIS — Z947 Corneal transplant status: Secondary | ICD-10-CM | POA: Diagnosis not present

## 2017-11-30 DIAGNOSIS — Z961 Presence of intraocular lens: Secondary | ICD-10-CM | POA: Diagnosis not present

## 2017-11-30 DIAGNOSIS — Z9841 Cataract extraction status, right eye: Secondary | ICD-10-CM | POA: Diagnosis not present

## 2017-11-30 DIAGNOSIS — H25812 Combined forms of age-related cataract, left eye: Secondary | ICD-10-CM | POA: Diagnosis not present

## 2017-12-11 ENCOUNTER — Other Ambulatory Visit: Payer: Self-pay | Admitting: Endocrinology

## 2017-12-11 ENCOUNTER — Telehealth: Payer: Self-pay | Admitting: Endocrinology

## 2017-12-11 DIAGNOSIS — E063 Autoimmune thyroiditis: Secondary | ICD-10-CM

## 2017-12-11 MED ORDER — LEVOTHYROXINE SODIUM 150 MCG PO TABS: 150.0000 ug | ORAL_TABLET | Freq: Every day | ORAL | 2 refills | Status: DC

## 2017-12-11 NOTE — Telephone Encounter (Signed)
Dose of her thyroid medication has been increased to 150 ug since her labs from PCP last month showed underactive thyroid, prescription has been sent

## 2017-12-12 ENCOUNTER — Encounter (HOSPITAL_COMMUNITY): Payer: Self-pay | Admitting: Emergency Medicine

## 2017-12-12 ENCOUNTER — Encounter: Payer: Self-pay | Admitting: Endocrinology

## 2017-12-12 ENCOUNTER — Other Ambulatory Visit: Payer: Self-pay | Admitting: Cardiology

## 2017-12-12 ENCOUNTER — Observation Stay (HOSPITAL_COMMUNITY)
Admission: EM | Admit: 2017-12-12 | Discharge: 2017-12-13 | Disposition: A | Discharge status: Home / Self Care | Payer: PPO | Attending: Internal Medicine | Admitting: Internal Medicine

## 2017-12-12 ENCOUNTER — Emergency Department (HOSPITAL_COMMUNITY): Payer: PPO

## 2017-12-12 ENCOUNTER — Other Ambulatory Visit: Payer: Self-pay

## 2017-12-12 DIAGNOSIS — Z794 Long term (current) use of insulin: Secondary | ICD-10-CM | POA: Diagnosis not present

## 2017-12-12 DIAGNOSIS — N183 Chronic kidney disease, stage 3 (moderate): Secondary | ICD-10-CM | POA: Diagnosis not present

## 2017-12-12 DIAGNOSIS — R42 Dizziness and giddiness: Secondary | ICD-10-CM | POA: Diagnosis not present

## 2017-12-12 DIAGNOSIS — Z887 Allergy status to serum and vaccine status: Secondary | ICD-10-CM | POA: Insufficient documentation

## 2017-12-12 DIAGNOSIS — R918 Other nonspecific abnormal finding of lung field: Secondary | ICD-10-CM | POA: Insufficient documentation

## 2017-12-12 DIAGNOSIS — R079 Chest pain, unspecified: Secondary | ICD-10-CM | POA: Diagnosis not present

## 2017-12-12 DIAGNOSIS — Z9049 Acquired absence of other specified parts of digestive tract: Secondary | ICD-10-CM | POA: Insufficient documentation

## 2017-12-12 DIAGNOSIS — I350 Nonrheumatic aortic (valve) stenosis: Secondary | ICD-10-CM

## 2017-12-12 DIAGNOSIS — E875 Hyperkalemia: Secondary | ICD-10-CM | POA: Diagnosis present

## 2017-12-12 DIAGNOSIS — I6523 Occlusion and stenosis of bilateral carotid arteries: Secondary | ICD-10-CM | POA: Diagnosis not present

## 2017-12-12 DIAGNOSIS — R55 Syncope and collapse: Secondary | ICD-10-CM

## 2017-12-12 DIAGNOSIS — E785 Hyperlipidemia, unspecified: Secondary | ICD-10-CM | POA: Insufficient documentation

## 2017-12-12 DIAGNOSIS — I272 Pulmonary hypertension, unspecified: Secondary | ICD-10-CM

## 2017-12-12 DIAGNOSIS — I480 Paroxysmal atrial fibrillation: Secondary | ICD-10-CM | POA: Insufficient documentation

## 2017-12-12 DIAGNOSIS — Z6841 Body Mass Index (BMI) 40.0 and over, adult: Secondary | ICD-10-CM | POA: Insufficient documentation

## 2017-12-12 DIAGNOSIS — E039 Hypothyroidism, unspecified: Secondary | ICD-10-CM | POA: Diagnosis present

## 2017-12-12 DIAGNOSIS — M109 Gout, unspecified: Secondary | ICD-10-CM | POA: Diagnosis not present

## 2017-12-12 DIAGNOSIS — F419 Anxiety disorder, unspecified: Secondary | ICD-10-CM | POA: Diagnosis not present

## 2017-12-12 DIAGNOSIS — R001 Bradycardia, unspecified: Secondary | ICD-10-CM | POA: Diagnosis present

## 2017-12-12 DIAGNOSIS — Z90710 Acquired absence of both cervix and uterus: Secondary | ICD-10-CM | POA: Insufficient documentation

## 2017-12-12 DIAGNOSIS — R0602 Shortness of breath: Secondary | ICD-10-CM | POA: Diagnosis not present

## 2017-12-12 DIAGNOSIS — I5033 Acute on chronic diastolic (congestive) heart failure: Secondary | ICD-10-CM | POA: Insufficient documentation

## 2017-12-12 DIAGNOSIS — I10 Essential (primary) hypertension: Secondary | ICD-10-CM

## 2017-12-12 DIAGNOSIS — Z9989 Dependence on other enabling machines and devices: Secondary | ICD-10-CM

## 2017-12-12 DIAGNOSIS — N179 Acute kidney failure, unspecified: Secondary | ICD-10-CM | POA: Diagnosis not present

## 2017-12-12 DIAGNOSIS — I130 Hypertensive heart and chronic kidney disease with heart failure and stage 1 through stage 4 chronic kidney disease, or unspecified chronic kidney disease: Secondary | ICD-10-CM | POA: Insufficient documentation

## 2017-12-12 DIAGNOSIS — Z8249 Family history of ischemic heart disease and other diseases of the circulatory system: Secondary | ICD-10-CM | POA: Insufficient documentation

## 2017-12-12 DIAGNOSIS — I509 Heart failure, unspecified: Secondary | ICD-10-CM

## 2017-12-12 DIAGNOSIS — E1122 Type 2 diabetes mellitus with diabetic chronic kidney disease: Secondary | ICD-10-CM | POA: Insufficient documentation

## 2017-12-12 DIAGNOSIS — E119 Type 2 diabetes mellitus without complications: Secondary | ICD-10-CM

## 2017-12-12 DIAGNOSIS — R0789 Other chest pain: Secondary | ICD-10-CM

## 2017-12-12 DIAGNOSIS — M8580 Other specified disorders of bone density and structure, unspecified site: Secondary | ICD-10-CM | POA: Insufficient documentation

## 2017-12-12 DIAGNOSIS — Z7901 Long term (current) use of anticoagulants: Secondary | ICD-10-CM | POA: Insufficient documentation

## 2017-12-12 DIAGNOSIS — Z8673 Personal history of transient ischemic attack (TIA), and cerebral infarction without residual deficits: Secondary | ICD-10-CM | POA: Insufficient documentation

## 2017-12-12 DIAGNOSIS — Z79899 Other long term (current) drug therapy: Secondary | ICD-10-CM | POA: Diagnosis not present

## 2017-12-12 DIAGNOSIS — G4733 Obstructive sleep apnea (adult) (pediatric): Secondary | ICD-10-CM

## 2017-12-12 DIAGNOSIS — I700 Atherosclerosis of aorta: Secondary | ICD-10-CM | POA: Diagnosis not present

## 2017-12-12 DIAGNOSIS — Z882 Allergy status to sulfonamides status: Secondary | ICD-10-CM | POA: Insufficient documentation

## 2017-12-12 DIAGNOSIS — E8770 Fluid overload, unspecified: Secondary | ICD-10-CM | POA: Diagnosis present

## 2017-12-12 DIAGNOSIS — K219 Gastro-esophageal reflux disease without esophagitis: Secondary | ICD-10-CM | POA: Insufficient documentation

## 2017-12-12 LAB — CBC
HCT: 35.3 % — ABNORMAL LOW (ref 36.0–46.0)
Hemoglobin: 11.4 g/dL — ABNORMAL LOW (ref 12.0–15.0)
MCH: 30.6 pg (ref 26.0–34.0)
MCHC: 32.3 g/dL (ref 30.0–36.0)
MCV: 94.6 fL (ref 78.0–100.0)
PLATELETS: 211 10*3/uL (ref 150–400)
RBC: 3.73 MIL/uL — ABNORMAL LOW (ref 3.87–5.11)
RDW: 14.6 % (ref 11.5–15.5)
WBC: 7.6 10*3/uL (ref 4.0–10.5)

## 2017-12-12 LAB — BASIC METABOLIC PANEL
Anion gap: 8 (ref 5–15)
BUN: 28 mg/dL — AB (ref 6–20)
CHLORIDE: 104 mmol/L (ref 101–111)
CO2: 24 mmol/L (ref 22–32)
CREATININE: 1.39 mg/dL — AB (ref 0.44–1.00)
Calcium: 8.7 mg/dL — ABNORMAL LOW (ref 8.9–10.3)
GFR calc Af Amer: 42 mL/min — ABNORMAL LOW (ref >60)
GFR calc non Af Amer: 36 mL/min — ABNORMAL LOW (ref >60)
Glucose, Bld: 184 mg/dL — ABNORMAL HIGH (ref 65–99)
Potassium: 6.7 mmol/L (ref 3.5–5.1)
SODIUM: 136 mmol/L (ref 135–145)

## 2017-12-12 LAB — I-STAT TROPONIN, ED: Troponin i, poc: 0 ng/mL (ref 0.00–0.08)

## 2017-12-12 IMAGING — DX DG CHEST 2V
2 series · 2 of 2 positions shown · non-contrast
Comparison: None.

CLINICAL DATA: Weakness short of breath

EXAM:
CHEST - 2 VIEW

[chest lat]
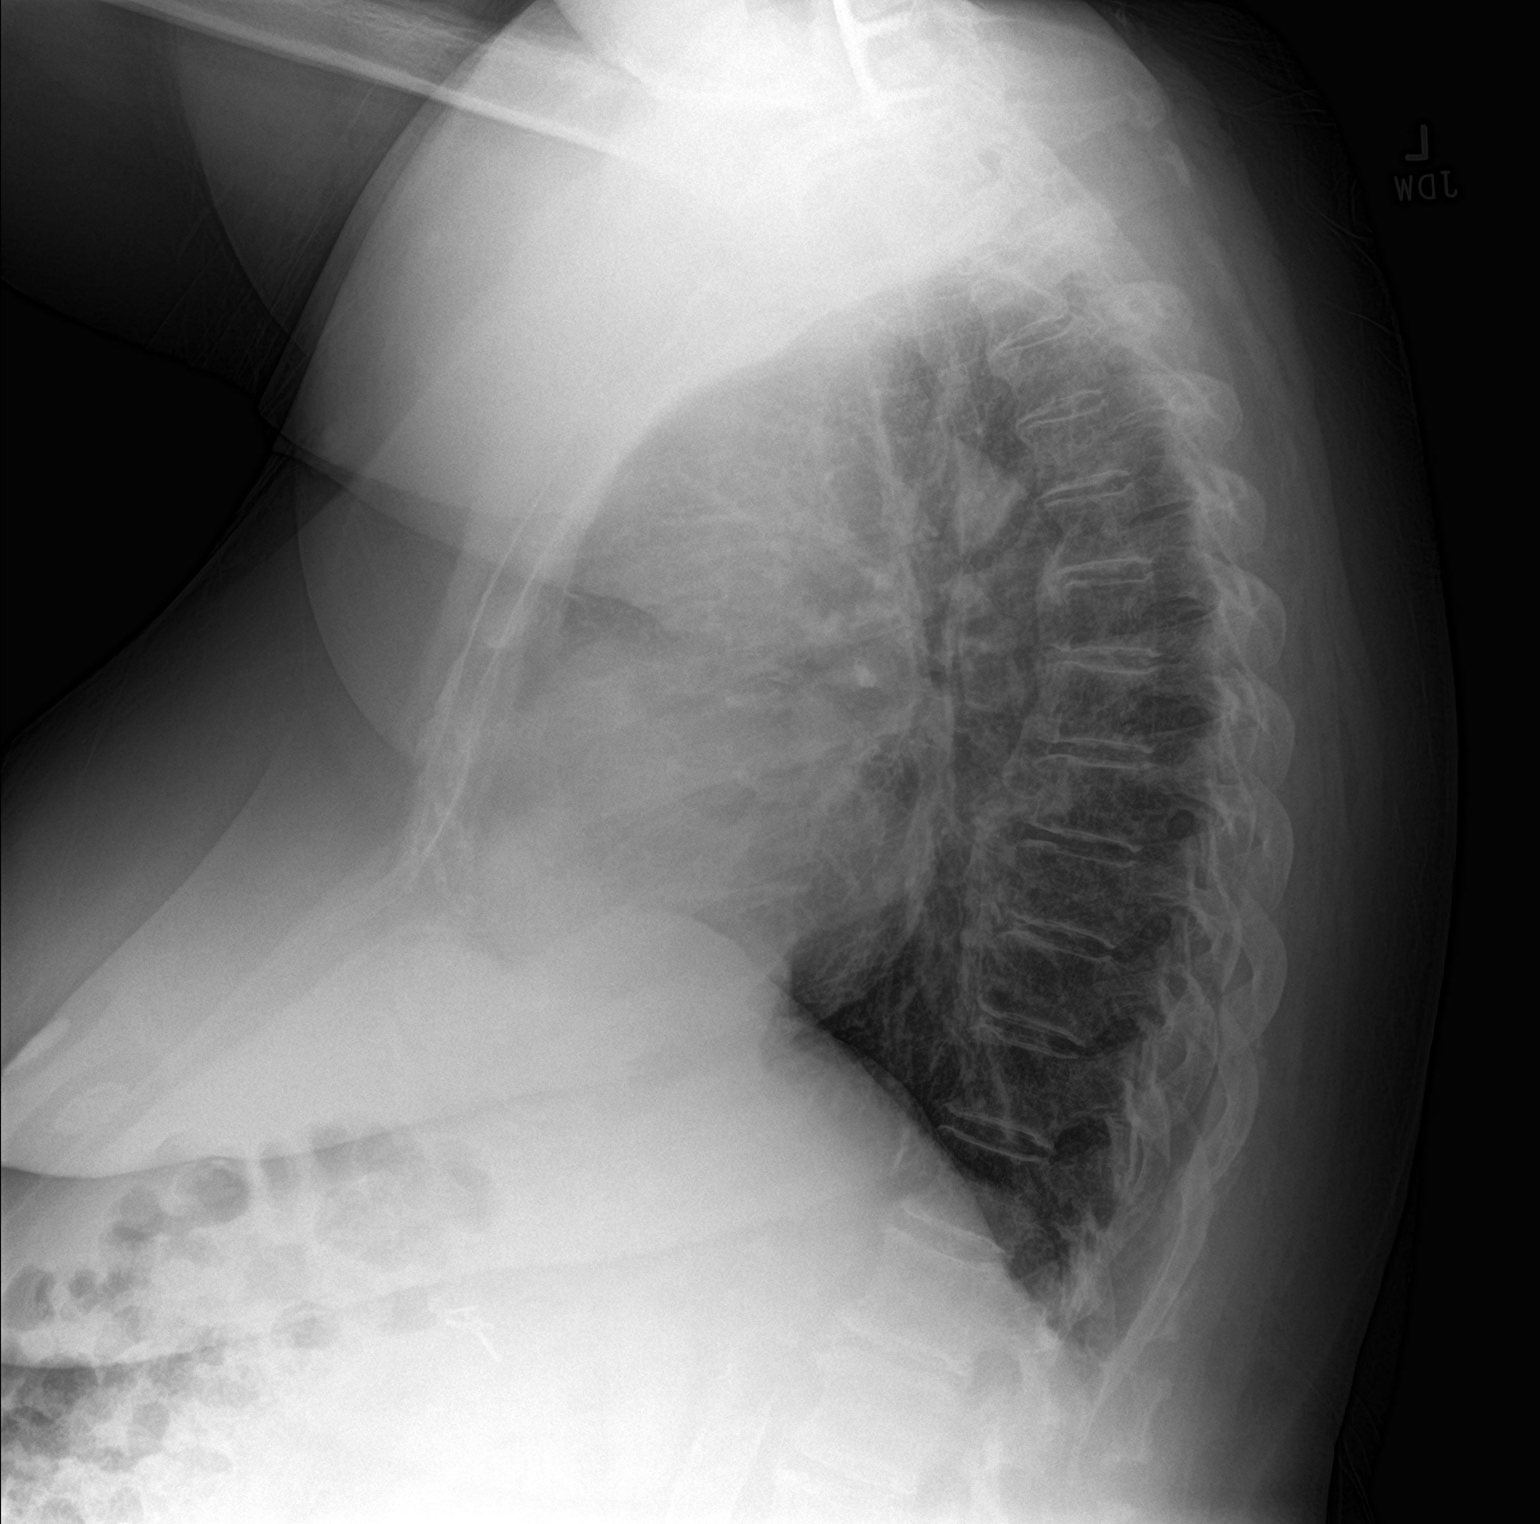

[chest ap]
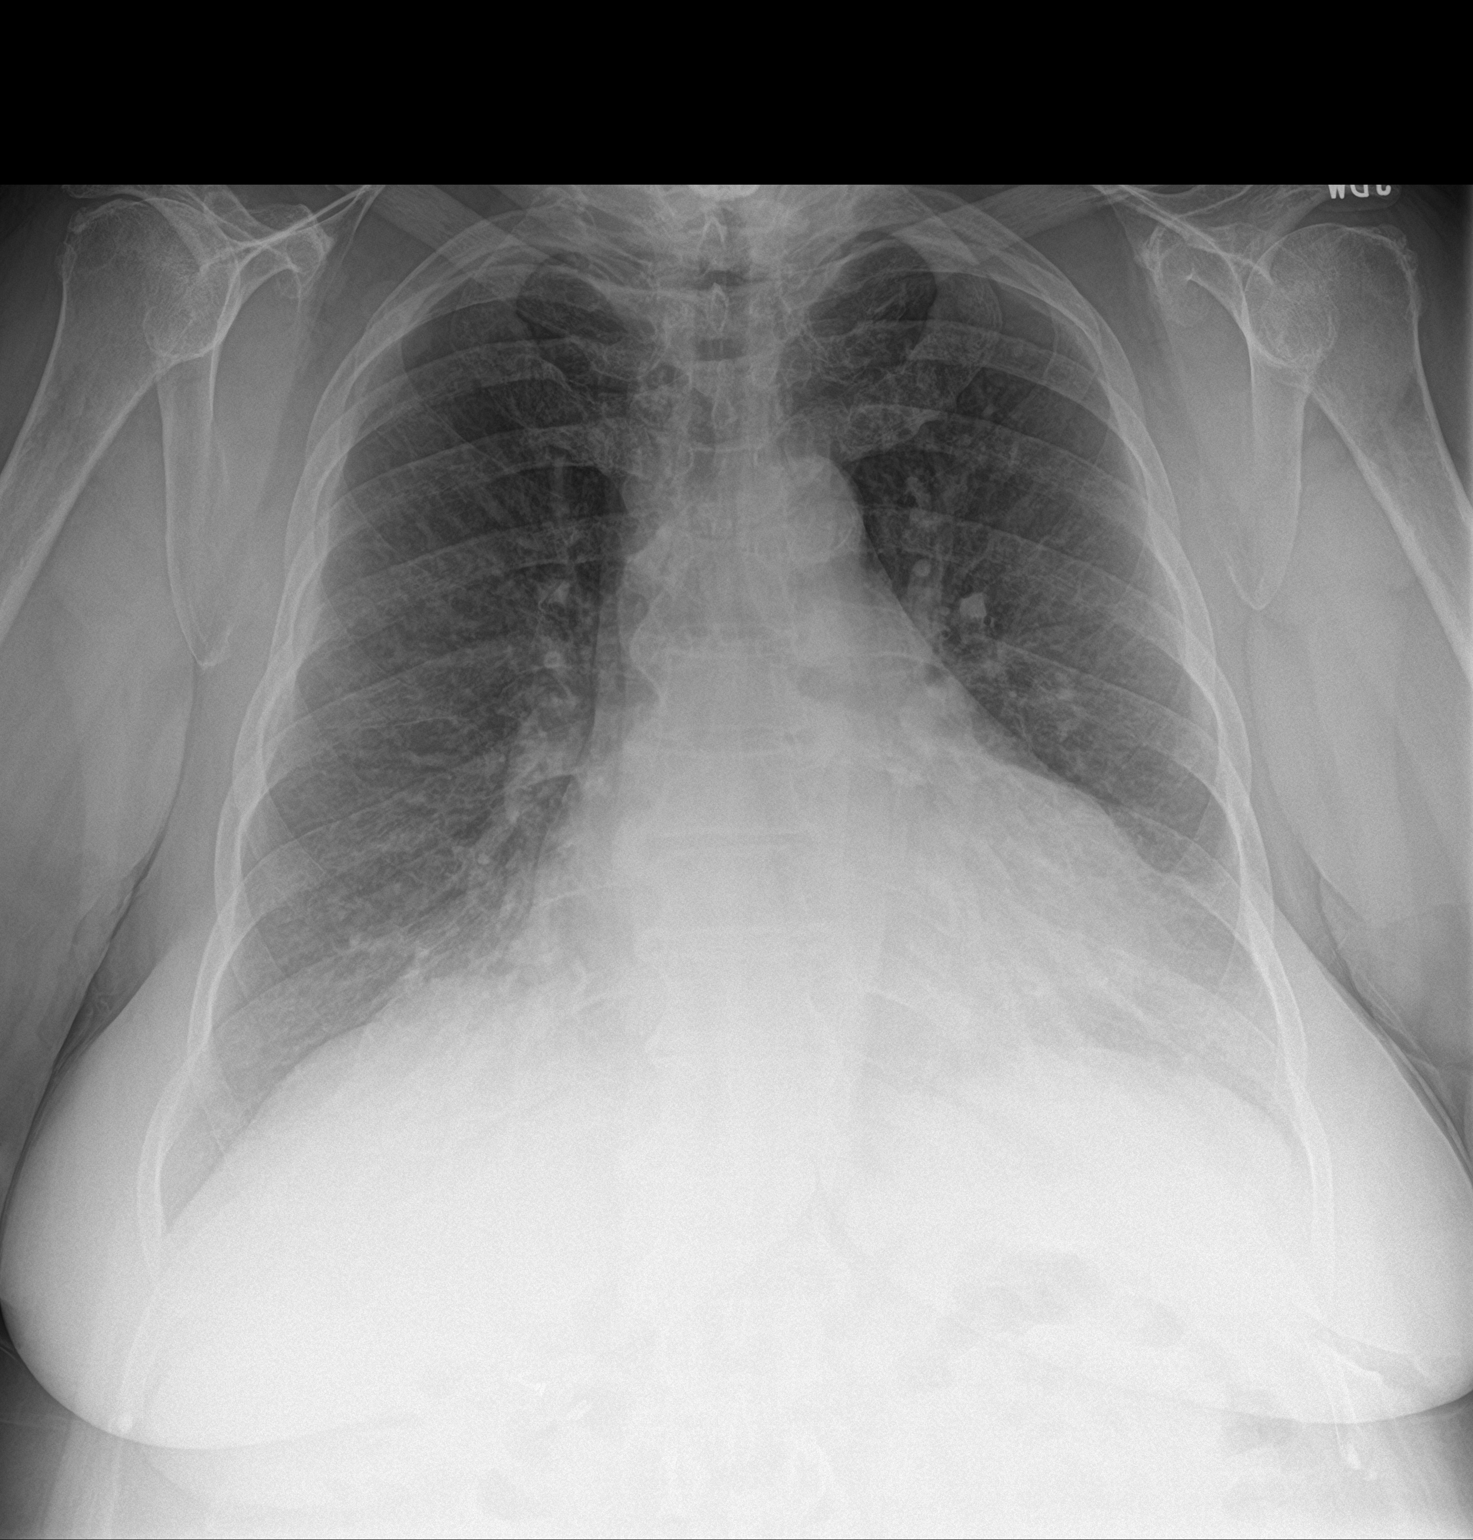

[2 of 2 positions shown; findings below may reference images not displayed]

FINDINGS: Postsurgical changes in the cervical spine. Hyperinflation. No
pleural effusion. Cardiomegaly with mild vascular congestion. Mild
diffuse interstitial opacity.. Aortic atherosclerosis. No
pneumothorax.
IMPRESSION: 1. Cardiomegaly with mild central congestion and suspected minimal
interstitial edema.
2. No focal airspace disease

## 2017-12-12 NOTE — Telephone Encounter (Signed)
Left mess for patient to call back.  

## 2017-12-12 NOTE — ED Triage Notes (Signed)
Pt presents with weakness and lightheadedness that began today; pt states she was performing normal daily activities, nothing out of the ordinary; pt reports usually in Afib;  Today she is bradycardic (HR 48-50)

## 2017-12-13 ENCOUNTER — Other Ambulatory Visit: Payer: Self-pay

## 2017-12-13 ENCOUNTER — Other Ambulatory Visit: Payer: Self-pay | Admitting: Physician Assistant

## 2017-12-13 ENCOUNTER — Encounter (HOSPITAL_COMMUNITY): Payer: Self-pay | Admitting: *Deleted

## 2017-12-13 ENCOUNTER — Other Ambulatory Visit (HOSPITAL_COMMUNITY): Payer: PPO

## 2017-12-13 DIAGNOSIS — R42 Dizziness and giddiness: Secondary | ICD-10-CM | POA: Diagnosis not present

## 2017-12-13 DIAGNOSIS — E8770 Fluid overload, unspecified: Secondary | ICD-10-CM | POA: Diagnosis present

## 2017-12-13 DIAGNOSIS — R001 Bradycardia, unspecified: Secondary | ICD-10-CM

## 2017-12-13 DIAGNOSIS — R55 Syncope and collapse: Secondary | ICD-10-CM

## 2017-12-13 DIAGNOSIS — N179 Acute kidney failure, unspecified: Secondary | ICD-10-CM | POA: Diagnosis present

## 2017-12-13 DIAGNOSIS — I480 Paroxysmal atrial fibrillation: Secondary | ICD-10-CM

## 2017-12-13 DIAGNOSIS — I509 Heart failure, unspecified: Secondary | ICD-10-CM

## 2017-12-13 DIAGNOSIS — R079 Chest pain, unspecified: Secondary | ICD-10-CM | POA: Diagnosis present

## 2017-12-13 DIAGNOSIS — E875 Hyperkalemia: Secondary | ICD-10-CM | POA: Diagnosis present

## 2017-12-13 LAB — LIPID PANEL
CHOL/HDL RATIO: 6.6 ratio
CHOLESTEROL: 197 mg/dL (ref 0–200)
HDL: 30 mg/dL — ABNORMAL LOW (ref >40)
LDL Cholesterol: 112 mg/dL — ABNORMAL HIGH (ref 0–99)
Triglycerides: 273 mg/dL — ABNORMAL HIGH (ref <150)
VLDL: 55 mg/dL — AB (ref 0–40)

## 2017-12-13 LAB — CBC
HEMATOCRIT: 39.7 % (ref 36.0–46.0)
HEMOGLOBIN: 12.6 g/dL (ref 12.0–15.0)
MCH: 29.8 pg (ref 26.0–34.0)
MCHC: 31.7 g/dL (ref 30.0–36.0)
MCV: 93.9 fL (ref 78.0–100.0)
Platelets: 207 10*3/uL (ref 150–400)
RBC: 4.23 MIL/uL (ref 3.87–5.11)
RDW: 14.5 % (ref 11.5–15.5)
WBC: 9.3 10*3/uL (ref 4.0–10.5)

## 2017-12-13 LAB — BASIC METABOLIC PANEL
ANION GAP: 13 (ref 5–15)
BUN: 25 mg/dL — ABNORMAL HIGH (ref 6–20)
CALCIUM: 9.4 mg/dL (ref 8.9–10.3)
CO2: 23 mmol/L (ref 22–32)
Chloride: 101 mmol/L (ref 101–111)
Creatinine, Ser: 1.29 mg/dL — ABNORMAL HIGH (ref 0.44–1.00)
GFR, EST AFRICAN AMERICAN: 45 mL/min — AB (ref >60)
GFR, EST NON AFRICAN AMERICAN: 39 mL/min — AB (ref >60)
Glucose, Bld: 222 mg/dL — ABNORMAL HIGH (ref 65–99)
POTASSIUM: 4.8 mmol/L (ref 3.5–5.1)
SODIUM: 137 mmol/L (ref 135–145)

## 2017-12-13 LAB — URINALYSIS, ROUTINE W REFLEX MICROSCOPIC
BILIRUBIN URINE: NEGATIVE
Glucose, UA: NEGATIVE mg/dL
Hgb urine dipstick: NEGATIVE
KETONES UR: NEGATIVE mg/dL
Nitrite: NEGATIVE
Protein, ur: NEGATIVE mg/dL
Specific Gravity, Urine: 1.009 (ref 1.005–1.030)
pH: 6 (ref 5.0–8.0)

## 2017-12-13 LAB — TROPONIN I

## 2017-12-13 LAB — GLUCOSE, CAPILLARY
GLUCOSE-CAPILLARY: 214 mg/dL — AB (ref 65–99)
GLUCOSE-CAPILLARY: 174 mg/dL — AB (ref 65–99)
Glucose-Capillary: 194 mg/dL — ABNORMAL HIGH (ref 65–99)

## 2017-12-13 LAB — BRAIN NATRIURETIC PEPTIDE: B NATRIURETIC PEPTIDE 5: 243 pg/mL — AB (ref 0.0–100.0)

## 2017-12-13 LAB — POTASSIUM: Potassium: 6.9 mmol/L (ref 3.5–5.1)

## 2017-12-13 LAB — MRSA PCR SCREENING: MRSA by PCR: NEGATIVE

## 2017-12-13 MED ORDER — ACYCLOVIR 400 MG PO TABS
400.0000 mg | ORAL_TABLET | Freq: Two times a day (BID) | ORAL | Status: DC
  Administered 2017-12-13: 400 mg via ORAL
  Filled 2017-12-13 (×2): qty 1

## 2017-12-13 MED ORDER — INSULIN ASPART 100 UNIT/ML ~~LOC~~ SOLN: 0.0000 [IU] | Freq: Every day | SUBCUTANEOUS | Status: DC

## 2017-12-13 MED ORDER — INSULIN ASPART 100 UNIT/ML ~~LOC~~ SOLN
0.0000 [IU] | Freq: Three times a day (TID) | SUBCUTANEOUS | Status: DC
Start: 2017-12-13 — End: 2017-12-13
  Administered 2017-12-13: 5 [IU] via SUBCUTANEOUS
  Administered 2017-12-13: 3 [IU] via SUBCUTANEOUS

## 2017-12-13 MED ORDER — AMLODIPINE BESYLATE 10 MG PO TABS: 10.0000 mg | ORAL_TABLET | Freq: Every day | ORAL | Status: DC

## 2017-12-13 MED ORDER — METOPROLOL SUCCINATE ER 25 MG PO TB24
25.0000 mg | ORAL_TABLET | Freq: Every day | ORAL | Status: DC
  Administered 2017-12-13: 25 mg via ORAL
  Filled 2017-12-13: qty 1

## 2017-12-13 MED ORDER — HYDROCHLOROTHIAZIDE 25 MG PO TABS: 25.0000 mg | ORAL_TABLET | Freq: Every day | ORAL | 0 refills | Status: DC

## 2017-12-13 MED ORDER — ATORVASTATIN CALCIUM 40 MG PO TABS: 40.0000 mg | ORAL_TABLET | Freq: Every day | ORAL | Status: DC

## 2017-12-13 MED ORDER — FUROSEMIDE 10 MG/ML IJ SOLN
20.0000 mg | Freq: Two times a day (BID) | INTRAMUSCULAR | Status: DC
  Administered 2017-12-13: 20 mg via INTRAVENOUS
  Filled 2017-12-13: qty 2

## 2017-12-13 MED ORDER — HYDROCHLOROTHIAZIDE 12.5 MG PO CAPS: 12.5000 mg | ORAL_CAPSULE | Freq: Every day | ORAL | Status: DC

## 2017-12-13 MED ORDER — LEVOTHYROXINE SODIUM 75 MCG PO TABS
150.0000 ug | ORAL_TABLET | Freq: Every day | ORAL | Status: DC
  Administered 2017-12-13: 150 ug via ORAL
  Filled 2017-12-13: qty 2

## 2017-12-13 MED ORDER — LISINOPRIL 20 MG PO TABS: 20.0000 mg | ORAL_TABLET | Freq: Every day | ORAL | Status: DC

## 2017-12-13 MED ORDER — DRONEDARONE HCL 400 MG PO TABS
400.0000 mg | ORAL_TABLET | Freq: Two times a day (BID) | ORAL | Status: DC
  Filled 2017-12-13: qty 1

## 2017-12-13 MED ORDER — ASPIRIN 81 MG PO CHEW
324.0000 mg | CHEWABLE_TABLET | Freq: Once | ORAL | Status: AC
  Administered 2017-12-13: 324 mg via ORAL
  Filled 2017-12-13: qty 4

## 2017-12-13 MED ORDER — DRONEDARONE HCL 400 MG PO TABS: 400.0000 mg | ORAL_TABLET | Freq: Two times a day (BID) | ORAL | 6 refills | Status: DC

## 2017-12-13 MED ORDER — INSULIN GLARGINE 100 UNIT/ML ~~LOC~~ SOLN
10.0000 [IU] | Freq: Every day | SUBCUTANEOUS | Status: DC
  Filled 2017-12-13: qty 0.1

## 2017-12-13 MED ORDER — SODIUM POLYSTYRENE SULFONATE 15 GM/60ML PO SUSP
30.0000 g | Freq: Once | ORAL | Status: AC
  Administered 2017-12-13: 30 g via ORAL
  Filled 2017-12-13: qty 120

## 2017-12-13 MED ORDER — METFORMIN HCL 850 MG PO TABS
850.0000 mg | ORAL_TABLET | Freq: Two times a day (BID) | ORAL | Status: DC
  Filled 2017-12-13: qty 1

## 2017-12-13 MED ORDER — FUROSEMIDE 10 MG/ML IJ SOLN
40.0000 mg | Freq: Once | INTRAMUSCULAR | Status: AC
  Administered 2017-12-13: 40 mg via INTRAVENOUS
  Filled 2017-12-13: qty 4

## 2017-12-13 MED ORDER — GABAPENTIN 100 MG PO CAPS
100.0000 mg | ORAL_CAPSULE | Freq: Every day | ORAL | Status: DC
  Administered 2017-12-13: 100 mg via ORAL
  Filled 2017-12-13: qty 1

## 2017-12-13 MED ORDER — NITROGLYCERIN 0.4 MG SL SUBL: 0.4000 mg | SUBLINGUAL_TABLET | SUBLINGUAL | Status: DC | PRN

## 2017-12-13 MED ORDER — PIOGLITAZONE HCL 15 MG PO TABS: 15.0000 mg | ORAL_TABLET | Freq: Every day | ORAL | Status: DC

## 2017-12-13 MED ORDER — APIXABAN 5 MG PO TABS
5.0000 mg | ORAL_TABLET | Freq: Two times a day (BID) | ORAL | Status: DC
  Administered 2017-12-13: 5 mg via ORAL
  Filled 2017-12-13: qty 1

## 2017-12-13 MED ORDER — PREDNISOLONE ACETATE 1 % OP SUSP
2.0000 [drp] | Freq: Every day | OPHTHALMIC | Status: DC
  Administered 2017-12-13: 2 [drp] via OPHTHALMIC
  Filled 2017-12-13: qty 1

## 2017-12-13 NOTE — Consult Note (Addendum)
Cardiology Consultation:   Patient ID: Tina Hester; [id number]; 07-Jun-1941   Admit date: 12/12/2017 Date of Consult: 12/13/2017  Primary Care Provider: Jonathon Jordan, MD Primary Cardiologist: Dr. Radford Pax   Patient Profile:   Tina Hester is a 77 y.o. female with a hx of AFib, OSA (w/CPAP), p.HTN, HTN, VHD w/mild AS, PVD w/non-obstructive carotid dz, CKD, gout, hypothyroidism, who is being seen today for the evaluation of possible tachy-brady syndrome at the request of Dr. Posey Pronto.  History of Present Illness:   Tina Hester was admitted early this morning to [facility] with c/o near syncope/dizzy spell, she was found to be bradycardic in the ER and EP is asked to evaluate further.  She last saw Dr. Radford Pax and the Afib clinic in Oct 2018, at both of those visit the patient did not express any concerns with her AFib, she reported occassionally palpitations that presumably thought to be AF and would take an additional 1/2 tab of her Toprol that resolved this.  At her visit with the AFib (109bpm) clinic she was in AFib and unaware of it, so unclear how much AF she has given symptoms not particularly reliable.  The patient did not feel like she was overtly symptomatic/bothered by her AF  LABS K+ 6.7 (slight hemolysis) >> 6.9 >> (hemolyzed) >> (Kayexalate given) >> 4.8  BUN/Creat 28/1.39 >> 25/1.29 (baseline creat looks 1.1-1.2) poc Trop 0.00 Trop I: <0.03 x2 BNP 243 WBC 9.3 H/H 12/39 Plts 207  Home meds (rate limiting) Metoprolol Succ 25mg  BID Timolol eye gtts  The patient reports a couple months of brief dizzy spells that were very fleeting and not worrisome to her, yesterday she was in her USOH had picked up her grandson from school and on the way back into the house became weak, not near syncopal.  She checked her HR by her phone APP and was 39-40bpm, she went ands laid down a bit, her HR remained 40 or so and called her daughter who brought her to the ER.    Here she apparently  had HR 48-51.  BP has been stable.  She had no CP, no palpitations, no syncope.  No nausea/vomiting or diaphoresis.  She feels completely well today    Past Medical History:  Diagnosis Date  . Anemia    s/p Heme work -up normal EGD and colonscopy in 2012 per pt,neg SPEP  . Anxiety   . Aortic stenosis    very mild by echo 05/2017  . Bilateral carotid artery stenosis 06/08/2015   1-39% bilateral  . CKD (chronic kidney disease), stage III ([place])   . Diabetes mellitus without complication ([place])    type 2  . GERD (gastroesophageal reflux disease)   . Gout   . HSV-1 (herpes simplex virus 1) infection    Acyclovir prn  . Hyperlipidemia   . Hypertension   . Joint pain    osteoarthritis by Xray- possible erosion of R 4th MCP,elevated uric  acid ,ANA +   . Morbid obesity ([place])   . OSA on CPAP   . Osteopenia   . PAF (paroxysmal atrial fibrillation) ([place])   . Pain management    Neurosurg: Dr Clydell Hakim  . Pulmonary HTN ([place])    Moderate by echo  2016. PASP 68mmHg  . TIA (transient ischemic attack)    remote history of TIA's early 2000    Past Surgical History:  Procedure Laterality Date  . ABDOMINAL HYSTERECTOMY    . APPENDECTOMY    . BACK  SURGERY     cervical fusion  . BLADDER SURGERY    . CESAREAN SECTION    . CHOLECYSTECTOMY       Inpatient Medications: Scheduled Meds: . acyclovir  400 mg Oral BID  . apixaban  5 mg Oral BID  . atorvastatin  40 mg Oral q1800  . furosemide  20 mg Intravenous BID  . gabapentin  100 mg Oral Daily  . insulin aspart  0-15 Units Subcutaneous TID WC  . insulin aspart  0-5 Units Subcutaneous QHS  . insulin glargine  10 Units Subcutaneous QHS  . levothyroxine  150 mcg Oral QAC breakfast  . metoprolol succinate  25 mg Oral Daily  . prednisoLONE acetate  2 drop Right Eye Daily   Continuous Infusions:  PRN Meds: nitroGLYCERIN  Allergies:    Allergies  Allergen Reactions  . Pneumococcal Vaccine Anaphylaxis    weakness  . Pneumovax 23  [Pneumococcal Vac Polyvalent] Anaphylaxis    weakness  . Other Hives    Other reaction(s): Other (See Comments) Pneumonia  Vaccine- very ill  . Sulfa Antibiotics Hives    Social History:   Social History   Socioeconomic History  . Marital status: Divorced    Spouse name: Not on file  . Number of children: Not on file  . Years of education: Not on file  . Highest education level: Not on file  Occupational History  . Not on file  Social Needs  . Financial resource strain: Not on file  . Food insecurity:    Worry: Not on file    Inability: Not on file  . Transportation needs:    Medical: Not on file    Non-medical: Not on file  Tobacco Use  . Smoking status: Never Smoker  . Smokeless tobacco: Never Used  Substance and Sexual Activity  . Alcohol use: No  . Drug use: No  . Sexual activity: Not on file  Lifestyle  . Physical activity:    Days per week: Not on file    Minutes per session: Not on file  . Stress: Not on file  Relationships  . Social connections:    Talks on phone: Not on file    Gets together: Not on file    Attends religious service: Not on file    Active member of club or organization: Not on file    Attends meetings of clubs or organizations: Not on file    Relationship status: Not on file  . Intimate partner violence:    Fear of current or ex partner: Not on file    Emotionally abused: Not on file    Physically abused: Not on file    Forced sexual activity: Not on file  Other Topics Concern  . Not on file  Social History Narrative  . Not on file    Family History:   Family History  Problem Relation Age of Onset  . Arrhythmia Brother      ROS:  Please see the history of present illness.  All other ROS reviewed and negative.     Physical Exam/Data:   Vitals:   12/13/17 0415 12/13/17 0708 12/13/17 1003 12/13/17 1229  BP: (!) 154/40 (!) 146/39 (!) 146/40 (!) 129/44  Pulse: 63 (!) 48    Resp: (!) 23 20  18   Temp: (!) 97.5 F (36.4 C)  97.6 F (36.4 C)  97.6 F (36.4 C)  TempSrc: Oral Oral  Oral  SpO2: 93% 95%    Weight:  Height:        Intake/Output Summary (Last 24 hours) at 12/13/2017 1234 Last data filed at 12/13/2017 1000 Gross per 24 hour  Intake 480 ml  Output 800 ml  Net -320 ml   Filed Weights   12/12/17 2215 12/13/17 0400  Weight: 240 lb (108.9 kg) 244 lb 11.4 oz (111 kg)   Body mass index is 46.24 kg/m.  General:  Well nourished, well developed, in no acute distress HEENT: normal Lymph: no adenopathy Neck: no JVD Endocrine:  No thryomegaly Vascular: No carotid bruits Cardiac:  normal S1, S2; RRR; no murmurs gallops or rubs Lungs:  CTA b/l, no wheezing, rhonchi or rales  Abd: soft, nontender, obese Ext: no edema Musculoskeletal:  No deformities Skin: warm and dry  Neuro:  No gross focal abnormalities noted Psych:  Normal affect   EKG:  The EKG was personally reviewed and demonstrates:   SB 50bpm, PR 171ms, QRS 17ms, QTc 469ms  06/22/17: AFib 109bpm 06/01/17: SR 61  Telemetry:  Telemetry was personally reviewed and demonstrates:   SB 50's- >> AFib 120's >> SR 60's-70's  Relevant CV Studies:  06/06/17: TTE Study Conclusions - Left ventricle: The cavity size was normal. Wall thickness was   increased in a pattern of mild LVH. Systolic function was normal.   The estimated ejection fraction was in the range of 60% to 65%. - Aortic valve: AV is thickened, calcified with mildly restricted   motion. Peak and mean gradients through the valve are 25 and 15   mm Hg respectively. - Pericardium, extracardiac: A trivial pericardial effusion was   identified.  Laboratory Data:  Chemistry Recent Labs  Lab 12/12/17 2240 12/13/17 0219 12/13/17 1001  NA 136  --  137  K 6.7* 6.9* 4.8  CL 104  --  101  CO2 24  --  23  GLUCOSE 184*  --  222*  BUN 28*  --  25*  CREATININE 1.39*  --  1.29*  CALCIUM 8.7*  --  9.4  GFRNONAA 36*  --  39*  GFRAA 42*  --  45*  ANIONGAP 8  --  13    No  results for input(s): PROT, ALBUMIN, AST, ALT, ALKPHOS, BILITOT in the last 168 hours. Hematology Recent Labs  Lab 12/12/17 2240 12/13/17 1001  WBC 7.6 9.3  RBC 3.73* 4.23  HGB 11.4* 12.6  HCT 35.3* 39.7  MCV 94.6 93.9  MCH 30.6 29.8  MCHC 32.3 31.7  RDW 14.6 14.5  PLT 211 207   Cardiac Enzymes Recent Labs  Lab 12/13/17 0219 12/13/17 1001  TROPONINI <0.03 <0.03    Recent Labs  Lab 12/12/17 2252  TROPIPOC 0.00    BNP Recent Labs  Lab 12/13/17 0219  BNP 243.0*    DDimer No results for input(s): DDIMER in the last 168 hours.  Radiology/Studies:   Dg Chest 2 View Result Date: 12/12/2017 CLINICAL DATA:  Weakness short of breath EXAM: CHEST - 2 VIEW COMPARISON:  None. FINDINGS: Postsurgical changes in the cervical spine. Hyperinflation. No pleural effusion. Cardiomegaly with mild vascular congestion. Mild diffuse interstitial opacity. Aortic atherosclerosis. No pneumothorax. IMPRESSION: 1. Cardiomegaly with mild central congestion and suspected minimal interstitial edema. 2. No focal airspace disease Electronically Signed   By: Donavan Foil M.D.   On: 12/12/2017 23:01    Assessment and Plan:   1. Paroxysmal AFib     CHA2DS2Vasc is 5, on Eliquis, appropriately dosed  2. Symptomatic bradycardia     On Toprol XL  25 mg BID at home     She had some AFib this AM 120's off her BB, received 25mg  this AM.  Rates 70's is back in SR  Dr. Curt Bears has seen and examined the patient Recommend Multaq without BB and event monitoring and follow up with Dr. Radford Pax.  (appointments Milburn Freeney be made for her)   For questions or updates, please contact [place] Please consult [email or web address] for contact info under Cardiology/STEMI.   Signed, Baldwin Jamaica, PA-C  12/13/2017 12:34 PM   I have seen and examined this patient with Tommye Standard.  Agree with above, note added to reflect my findings.  On exam, RRR, no murmurs, lungs clear.  Hospital with bradycardia.  She takes metoprolol  25 mg twice a day.  She also has atrial fibrillation with occasional rates in the 120s.  Due to her bradycardia, we Ahmaya Ostermiller stop her metoprolol.  We Nikolas Casher start her on Multaq as an outpatient.  We Yasir Kitner arrange for her to wear a 30-day monitor for further determination of this or pauses are continuing.  Chinelo Benn M. Clark Clowdus MD 12/13/2017 1:50 PM

## 2017-12-13 NOTE — Progress Notes (Signed)
Cardiology PA and claimed that pt. May start   taking multaq tom. and to call pharmacy for her med for any problem to call the office. Discharged instructions given to pt. Belongings taken home.

## 2017-12-13 NOTE — ED Provider Notes (Signed)
TIME SEEN: 12:45 AM  CHIEF COMPLAINT: Near syncope  HPI: Patient is a 77 year old female with history of chronic kidney disease, diabetes, hyperlipidemia, hypertension, obesity, atrial fibrillation on Eliquis, TIA who presents to the emergency department with near syncopal event that occurred prior to arrival.  States that she felt very lightheaded and weak and felt like she might pass out.  She states she has had similar dizzy episodes over the past several weeks but they have never been this severe to where she felt like she might pass out.  States that she measured her heart rate at home it was 38.  She is on Toprol-XL which she takes twice daily.  No change in his medication.  She took it this morning but has not had her evening dose.  States she did have an episode of chest pressure that was mostly with lying flat as well as shortness of breath and nausea.  No vomiting.  No diaphoresis.  Currently chest pain-free.  Heart rate in the 50s.  Her cardiologist is Dr. Radford Pax.  Her PCP is Dr. Stephanie Acre.  ROS: See HPI Constitutional: no fever  Eyes: no drainage  ENT: no runny nose   Cardiovascular:   chest pain  Resp: SOB  GI: no vomiting GU: no dysuria Integumentary: no rash  Allergy: no hives  Musculoskeletal: no leg swelling  Neurological: no slurred speech ROS otherwise negative  PAST MEDICAL HISTORY/PAST SURGICAL HISTORY:  Past Medical History:  Diagnosis Date  . Anemia    s/p Heme work -up normal EGD and colonscopy in 2012 per pt,neg SPEP  . Anxiety   . Aortic stenosis    very mild by echo 05/2017  . Bilateral carotid artery stenosis 06/08/2015   1-39% bilateral  . CKD (chronic kidney disease), stage III ([place])   . Diabetes mellitus without complication ([place])    type 2  . GERD (gastroesophageal reflux disease)   . Gout   . HSV-1 (herpes simplex virus 1) infection    Acyclovir prn  . Hyperlipidemia   . Hypertension   . Joint pain    osteoarthritis by Xray- possible erosion of R  4th MCP,elevated uric  acid ,ANA +   . Morbid obesity ([place])   . OSA on CPAP   . Osteopenia   . PAF (paroxysmal atrial fibrillation) ([place])   . Pain management    Neurosurg: Dr Clydell Hakim  . Pulmonary HTN ([place])    Moderate by echo  2016. PASP 19mmHg  . TIA (transient ischemic attack)    remote history of TIA's early 2000    MEDICATIONS:  Prior to Admission medications   Medication Sig Start Date End Date Taking? Authorizing Provider  acyclovir (ZOVIRAX) 400 MG tablet Take 400 mg by mouth 2 (two) times daily.  09/03/15   [provider]  ALPRAZolam Duanne Moron) 0.25 MG tablet Take 0.25 mg by mouth at bedtime as needed for anxiety.    [provider]  amLODipine (NORVASC) 5 MG tablet Take 1 tablet (5 mg total) by mouth daily. 06/01/17   Sueanne Margarita, MD  atorvastatin (LIPITOR) 40 MG tablet Take 1 tablet (40 mg total) by mouth daily at 6 PM. 06/01/17   Turner, Eber Hong, MD  CALCIUM PO Take 1 tablet by mouth daily.     [provider]  Calcium Polycarbophil 625 MG CHEW Chew by mouth.    [provider]  COLCRYS 0.6 MG tablet Take 0.6 mg by mouth daily as needed (gout).  05/22/13  [provider]  ELIQUIS 5 MG TABS tablet TAKE 1 TABLET(5 MG) BY MOUTH TWICE DAILY 05/17/17   Sueanne Margarita, MD  Ergocalciferol (VITAMIN D2) 2000 units TABS Take 2,000 Units by mouth daily.     [provider]  erythromycin ophthalmic ointment nightly.    [provider]  gabapentin (NEURONTIN) 100 MG capsule Take 100 mg by mouth daily.    [provider]  glucose blood (FREESTYLE LITE) test strip Use to test blood sugar twice daily. Dx: E11.9 11/12/15   Elayne Snare, MD  hydrochlorothiazide (MICROZIDE) 12.5 MG capsule Take 1 capsule (12.5 mg total) by mouth daily. 06/03/17   Sueanne Margarita, MD  insulin NPH Human (HUMULIN N) 100 UNIT/ML injection Inject 20-45 Units into the skin See admin instructions. INJECT 20 UNITS EVERY MORNING AND 45 UNITS EVERY  NIGHT     [provider]  insulin regular (NOVOLIN R RELION) 100 units/mL injection Inject 0.06-0.1 mLs (6-10 Units total) into the skin 3 (three) times daily before meals. Patient taking differently: Inject 6-10 Units into the skin 2 (two) times daily before a meal.  04/21/15   Elayne Snare, MD  Insulin Syringe-Needle U-100 (INSULIN SYRINGE .5CC/30GX5/16") 30G X 5/16" 0.5 ML MISC Use 2 needles per day to inject insulin 08/15/13   Elayne Snare, MD  levothyroxine (SYNTHROID, LEVOTHROID) 150 MCG tablet Take 1 tablet (150 mcg total) by mouth daily before breakfast. 12/11/17   Elayne Snare, MD  lisinopril (PRINIVIL,ZESTRIL) 20 MG tablet Take 1 tablet (20 mg total) by mouth daily. 06/03/17   Sueanne Margarita, MD  magnesium 30 MG tablet Take 30 mg by mouth 2 (two) times daily.    [provider]  Magnesium Gluconate 550 MG TABS Take 30 mg by mouth.    [provider]  metFORMIN (GLUCOPHAGE) 500 MG tablet TAKE 1 TABLET BY MOUTH EVERY MORNING AND 2 TABLETS BY MOUTH EVERY EVENING 10/12/17   Elayne Snare, MD  metFORMIN (GLUCOPHAGE) 500 MG tablet TAKE 1 TABLET BY MOUTH EVERY MORNING AND 2 TABLETS BY MOUTH EVERY EVENING 11/15/17   Elayne Snare, MD  metoprolol succinate (TOPROL-XL) 25 MG 24 hr tablet TAKE 1 TABLET BY MOUTH TWICE DAILY 05/27/17   Sueanne Margarita, MD  moxifloxacin (VIGAMOX) 0.5 % ophthalmic solution Place 1 drop into the right eye 4 times daily.    [provider]  Multiple Vitamin (MULTIVITAMIN) capsule Take 1 capsule by mouth daily. Reported on 01/14/2016    [provider]  NEEDLE, DISP, 30 G (BD DISP NEEDLES) 30G X 1/2" MISC 1 each by Does not apply route 2 (two) times daily before a meal. 07/11/14   Elayne Snare, MD  NEOMYCIN-POLYMYXIN-HYDROCORTISONE (CORTISPORIN) 1 % SOLN otic solution Apply 1-2 drops to toe BID after soaking 11/23/16   Hyatt, Max T, DPM  ONETOUCH DELICA LANCETS 65L MISC Use to check blood sugars once daily 10/08/13   Elayne Snare, MD  pioglitazone  (ACTOS) 15 MG tablet TAKE 1 TABLET BY MOUTH EVERY DAY 11/06/17   Elayne Snare, MD  prednisoLONE acetate (PRED FORTE) 1 % ophthalmic suspension Place 2 drops into the right eye daily. 03/23/17   [provider]  timolol (TIMOPTIC) 0.5 % ophthalmic solution USE 1 DROP IN RIGHT EYE D 10/21/15   [provider]  vitamin B-12 (CYANOCOBALAMIN) 1000 MCG tablet Take 1,000 mcg by mouth daily.    [provider]    ALLERGIES:  Allergies  Allergen Reactions  . Pneumococcal Vaccine Anaphylaxis  weakness  . Pneumovax 23 [Pneumococcal Vac Polyvalent] Anaphylaxis    weakness  . Other Hives    Other reaction(s): Other (See Comments) Pneumonia  Vaccine- very ill  . Sulfa Antibiotics Hives    SOCIAL HISTORY:  Social History   Tobacco Use  . Smoking status: Never Smoker  . Smokeless tobacco: Never Used  Substance Use Topics  . Alcohol use: No    FAMILY HISTORY: Family History  Problem Relation Age of Onset  . Arrhythmia Brother     EXAM: BP (!) 157/44   Pulse (!) 51   Temp 97.8 F (36.6 C) (Oral)   Resp 19   Ht 5' 1.5" (1.562 m)   Wt 108.9 kg (240 lb)   SpO2 99%   BMI 44.61 kg/m  CONSTITUTIONAL: Alert and oriented and responds appropriately to questions. Well-appearing; well-nourished HEAD: Normocephalic EYES: Conjunctivae clear, pupils appear equal, EOMI ENT: normal nose; moist mucous membranes NECK: Supple, no meningismus, no nuchal rigidity, no LAD  CARD: Regular and bradycardic; S1 and S2 appreciated; + murmur, no clicks, no rubs, no gallops RESP: Normal chest excursion without splinting or tachypnea; breath sounds equal bilaterally but she does have some scattered expiratory wheezes, no rhonchi, no rales, no hypoxia or respiratory distress, speaking full sentences ABD/GI: Normal bowel sounds; non-distended; soft, non-tender, no rebound, no guarding, no peritoneal signs, no hepatosplenomegaly BACK:  The back appears normal and is non-tender to  palpation, there is no CVA tenderness EXT: Normal ROM in all joints; non-tender to palpation; no edema; normal capillary refill; no cyanosis, no calf tenderness or swelling    SKIN: Normal color for age and race; warm; no rash NEURO: Moves all extremities equally PSYCH: The patient's mood and manner are appropriate. Grooming and personal hygiene are appropriate.  MEDICAL DECISION MAKING: Patient here with near syncopal event with chest pain.  Has multiple risk factors for ACS.  May be related to sinus bradycardia from being on Toprol.  Her rate is currently in the 50s.  She has had previous document and heart rates on chart review in the 40s.  I am concerned however about her heart rate dropping as low as 38 at home and she is feeling very symptomatic from it.  I feel she will need to be admitted for chest pain rule out.  Her labs show a potassium of 6.7 but this is likely secondary to hemolysis.  No EKG changes to suggest hyperkalemia.  We will repeat her potassium level.  Troponin is negative.  Chest x-ray shows mild interstitial edema.  She does have some wheezing on exam.  Will give IV Lasix.  Will check BNP.  Will discuss with medicine for admission.  ED PROGRESS:    12:45 AM Discussed patient's case with hospitalist, Dr. Claria Dice.  I have recommended admission and patient (and family if present) agree with this plan. Admitting physician will place admission orders.   I reviewed all nursing notes, vitals, pertinent previous records, EKGs, lab and urine results, imaging (as available).     EKG Interpretation  Date/Time:  Monday December 12 2017 22:07:28 EDT Ventricular Rate:  50 PR Interval:  168 QRS Duration: 98 QT Interval:  478 QTC Calculation: 435 R Axis:   -27 Text Interpretation:  Sinus bradycardia Low voltage QRS Cannot rule out Anterior infarct , age undetermined Abnormal ECG A fib no longer present Confirmed by Pryor Curia [phone]) on 12/13/2017 12:22:08 AM         Rohil Lesch,  Delice Bison, DO 12/13/17 2831

## 2017-12-13 NOTE — Care Management CC44 (Signed)
Condition Code 44 Documentation Completed  Patient Details  Name: Tina Hester MRN: [id number] Date of Birth: 05-21-1941   Condition Code 44 given:  Yes Patient signature on Condition Code 44 notice:  Yes Documentation of 2 MD's agreement:  Yes Code 44 added to claim:  Yes    Maryclare Labrador, RN 12/13/2017, 3:57 PM

## 2017-12-13 NOTE — ED Notes (Signed)
Upon transport patient requested bedside toilet

## 2017-12-13 NOTE — H&P (Signed)
PCP:   Jonathon Jordan, MD  Cardiologist: Dr Radford Pax   Chief Complaint:  presyncopoe  HPI: this is a 77y/o female who presernts after she reports having mild dizzy spell on and off, today she has a dizzy spell where she almost passed out. She checked he heart rate and it was 38-low 40's. She felt very weak and terrible. She denies chest pains but states she was just uncomfortable. She states she was a bit SOB. Patient in metoperolol for a fib, she has been on this for years. Here in tge ER the patient is relatively bradycardic with heart rates between 48- 51. No recent fever, chills, nausea, vomiting. She reports having chronic diarrhea,whgicgh believes it is d/t her magnesium that she takes for leg cramps. Her diarhhea is no worse than baseline. History provided by the patient.   Review of Systems:  The patient denies anorexia, fever, weight loss,, vision loss, decreased hearing, hoarseness, chest pain, presyncope, syncope, dyspnea on exertion, peripheral edema, balance deficits, hemoptysis, abdominal pain, melena, hematochezia, severe indigestion/heartburn, hematuria, incontinence, genital sores, muscle weakness, suspicious skin lesions, transient blindness, difficulty walking, depression, unusual weight change, abnormal bleeding, enlarged lymph nodes, angioedema, and breast masses.  Past Medical History: Past Medical History:  Diagnosis Date  . Anemia    s/p Heme work -up normal EGD and colonscopy in 2012 per pt,neg SPEP  . Anxiety   . Aortic stenosis    very mild by echo 05/2017  . Bilateral carotid artery stenosis 06/08/2015   1-39% bilateral  . CKD (chronic kidney disease), stage III ([place])   . Diabetes mellitus without complication ([place])    type 2  . GERD (gastroesophageal reflux disease)   . Gout   . HSV-1 (herpes simplex virus 1) infection    Acyclovir prn  . Hyperlipidemia   . Hypertension   . Joint pain    osteoarthritis by Xray- possible erosion of R 4th MCP,elevated uric   acid ,ANA +   . Morbid obesity ([place])   . OSA on CPAP   . Osteopenia   . PAF (paroxysmal atrial fibrillation) ([place])   . Pain management    Neurosurg: Dr Clydell Hakim  . Pulmonary HTN ([place])    Moderate by echo  2016. PASP 54mmHg  . TIA (transient ischemic attack)    remote history of TIA's early 2000   Past Surgical History:  Procedure Laterality Date  . ABDOMINAL HYSTERECTOMY    . APPENDECTOMY    . BACK SURGERY     cervical fusion  . BLADDER SURGERY    . CESAREAN SECTION    . CHOLECYSTECTOMY      Medications: Prior to Admission medications   Medication Sig Start Date End Date Taking? Authorizing Provider  acyclovir (ZOVIRAX) 400 MG tablet Take 400 mg by mouth 2 (two) times daily.  09/03/15   [provider]  ALPRAZolam Duanne Moron) 0.25 MG tablet Take 0.25 mg by mouth at bedtime as needed for anxiety.    [provider]  amLODipine (NORVASC) 5 MG tablet Take 1 tablet (5 mg total) by mouth daily. 06/01/17   Sueanne Margarita, MD  atorvastatin (LIPITOR) 40 MG tablet Take 1 tablet (40 mg total) by mouth daily at 6 PM. 06/01/17   Turner, Eber Hong, MD  CALCIUM PO Take 1 tablet by mouth daily.     [provider]  Calcium Polycarbophil 625 MG CHEW Chew by mouth.    [provider]  COLCRYS 0.6 MG tablet Take 0.6 mg by  mouth daily as needed (gout).  05/22/13   [provider]  ELIQUIS 5 MG TABS tablet TAKE 1 TABLET(5 MG) BY MOUTH TWICE DAILY 05/17/17   Sueanne Margarita, MD  Ergocalciferol (VITAMIN D2) 2000 units TABS Take 2,000 Units by mouth daily.     [provider]  erythromycin ophthalmic ointment nightly.    [provider]  gabapentin (NEURONTIN) 100 MG capsule Take 100 mg by mouth daily.    [provider]  glucose blood (FREESTYLE LITE) test strip Use to test blood sugar twice daily. Dx: E11.9 11/12/15   Elayne Snare, MD  hydrochlorothiazide (MICROZIDE) 12.5 MG capsule Take 1 capsule (12.5 mg total) by mouth daily. 06/03/17    Sueanne Margarita, MD  insulin NPH Human (HUMULIN N) 100 UNIT/ML injection Inject 20-45 Units into the skin See admin instructions. INJECT 20 UNITS EVERY MORNING AND 45 UNITS EVERY NIGHT     [provider]  insulin regular (NOVOLIN R RELION) 100 units/mL injection Inject 0.06-0.1 mLs (6-10 Units total) into the skin 3 (three) times daily before meals. Patient taking differently: Inject 6-10 Units into the skin 2 (two) times daily before a meal.  04/21/15   Elayne Snare, MD  Insulin Syringe-Needle U-100 (INSULIN SYRINGE .5CC/30GX5/16") 30G X 5/16" 0.5 ML MISC Use 2 needles per day to inject insulin 08/15/13   Elayne Snare, MD  levothyroxine (SYNTHROID, LEVOTHROID) 150 MCG tablet Take 1 tablet (150 mcg total) by mouth daily before breakfast. 12/11/17   Elayne Snare, MD  lisinopril (PRINIVIL,ZESTRIL) 20 MG tablet Take 1 tablet (20 mg total) by mouth daily. 06/03/17   Sueanne Margarita, MD  magnesium 30 MG tablet Take 30 mg by mouth 2 (two) times daily.    [provider]  Magnesium Gluconate 550 MG TABS Take 30 mg by mouth.    [provider]  metFORMIN (GLUCOPHAGE) 500 MG tablet TAKE 1 TABLET BY MOUTH EVERY MORNING AND 2 TABLETS BY MOUTH EVERY EVENING 10/12/17   Elayne Snare, MD  metFORMIN (GLUCOPHAGE) 500 MG tablet TAKE 1 TABLET BY MOUTH EVERY MORNING AND 2 TABLETS BY MOUTH EVERY EVENING 11/15/17   Elayne Snare, MD  metoprolol succinate (TOPROL-XL) 25 MG 24 hr tablet TAKE 1 TABLET BY MOUTH TWICE DAILY 05/27/17   Sueanne Margarita, MD  moxifloxacin (VIGAMOX) 0.5 % ophthalmic solution Place 1 drop into the right eye 4 times daily.    [provider]  Multiple Vitamin (MULTIVITAMIN) capsule Take 1 capsule by mouth daily. Reported on 01/14/2016    [provider]  NEEDLE, DISP, 30 G (BD DISP NEEDLES) 30G X 1/2" MISC 1 each by Does not apply route 2 (two) times daily before a meal. 07/11/14   Elayne Snare, MD  NEOMYCIN-POLYMYXIN-HYDROCORTISONE (CORTISPORIN) 1 % SOLN otic solution  Apply 1-2 drops to toe BID after soaking 11/23/16   Hyatt, Max T, DPM  ONETOUCH DELICA LANCETS 09N MISC Use to check blood sugars once daily 10/08/13   Elayne Snare, MD  pioglitazone (ACTOS) 15 MG tablet TAKE 1 TABLET BY MOUTH EVERY DAY 11/06/17   Elayne Snare, MD  prednisoLONE acetate (PRED FORTE) 1 % ophthalmic suspension Place 2 drops into the right eye daily. 03/23/17   [provider]  timolol (TIMOPTIC) 0.5 % ophthalmic solution USE 1 DROP IN RIGHT EYE D 10/21/15   [provider]  vitamin B-12 (CYANOCOBALAMIN) 1000 MCG tablet Take 1,000 mcg by mouth daily.    [provider]    Allergies:   Allergies  Allergen Reactions  . Pneumococcal Vaccine Anaphylaxis    weakness  . Pneumovax 23 [Pneumococcal Vac Polyvalent] Anaphylaxis    weakness  . Other Hives    Other reaction(s): Other (See Comments) Pneumonia  Vaccine- very ill  . Sulfa Antibiotics Hives    Social History:  reports that she has never smoked. She has never used smokeless tobacco. She reports that she does not drink alcohol or use drugs.  Family History: Family History  Problem Relation Age of Onset  . Arrhythmia Brother     Physical Exam: Vitals:   12/12/17 2214 12/12/17 2215 12/12/17 2217 12/13/17 0000  BP: (!) 178/49  (!) 183/61 (!) 157/44  Pulse: (!) 48   (!) 51  Resp: 18   19  Temp: 97.8 F (36.6 C)     TempSrc: Oral     SpO2: 100%   99%  Weight:  108.9 kg (240 lb)    Height:  5' 1.5" (1.562 m)      General:  Alert and oriented times three, well developed and nourished, no acute distress Eyes: PERRLA, pink conjunctiva, no scleral icterus ENT: Moist oral mucosa, neck supple, no thyromegaly Lungs: clear to ascultation, no wheeze, no crackles, no use of accessory muscles Cardiovascular: regular rate and rhythm, no regurgitation, no gallops, no murmurs. No carotid bruits, no JVD Abdomen: soft, positive BS, non-tender, non-distended, no organomegaly, not an acute abdomen GU: not  examined Neuro: CN II - XII grossly intact, sensation intact Musculoskeletal: strength 5/5 all extremities, no clubbing, cyanosis or edema Skin: no rash, no subcutaneous crepitation, no decubitus Psych: appropriate patient   Labs on Admission:  Recent Labs    12/12/17 2240  NA 136  K 6.7*  CL 104  CO2 24  GLUCOSE 184*  BUN 28*  CREATININE 1.39*  CALCIUM 8.7*   No results for input(s): AST, ALT, ALKPHOS, BILITOT, PROT, ALBUMIN in the last 72 hours. No results for input(s): LIPASE, AMYLASE in the last 72 hours. Recent Labs    12/12/17 2240  WBC 7.6  HGB 11.4*  HCT 35.3*  MCV 94.6  PLT 211   No results for input(s): CKTOTAL, CKMB, CKMBINDEX, TROPONINI in the last 72 hours. Invalid input(s): POCBNP No results for input(s): DDIMER in the last 72 hours. No results for input(s): HGBA1C in the last 72 hours. No results for input(s): CHOL, HDL, LDLCALC, TRIG, CHOLHDL, LDLDIRECT in the last 72 hours. No results for input(s): TSH, T4TOTAL, T3FREE, THYROIDAB in the last 72 hours.  Invalid input(s): FREET3 No results for input(s): VITAMINB12, FOLATE, FERRITIN, TIBC, IRON, RETICCTPCT in the last 72 hours.  Micro Results: No results found for this or any previous visit (from the past 240 hour(s)).   Radiological Exams on Admission: Dg Chest 2 View  Result Date: 12/12/2017 CLINICAL DATA:  Weakness short of breath EXAM: CHEST - 2 VIEW COMPARISON:  None. FINDINGS: Postsurgical changes in the cervical spine. Hyperinflation. No pleural effusion. Cardiomegaly with mild vascular congestion. Mild diffuse interstitial opacity. Aortic atherosclerosis. No pneumothorax. IMPRESSION: 1. Cardiomegaly with mild central congestion and suspected minimal interstitial edema. 2. No focal airspace disease Electronically Signed   By: Donavan Foil M.D.   On: 12/12/2017 23:01    Assessment/Plan Present on Admission: Presyncopal episodes -Admit to med telemetry -Likely due to bradycardia -Toprol  DC'd -We will monitor in telemetry -consult cardiology  . Bradycardia -Toprol DC'd, cardiology consult placed as patient with a fib  . Hyperkalemia -unclear if secondary to hemolysis. Repeat potassium ordered and  pending. Nursing communications written for kayexalate if potassium remains elevated. EKG with no hyperkalemic changes  . chronic kidney injury ([place]) -Stable, at baseline  . Fluid overload -IV lasix, 2D echo, daily weighs, strict I/O's  Diabetes  -ASA diet, Lantus 10 units [place] daily, SSI  . Gout -Stable, home meds resumed  . Hypertension -Toprol DC'd, Norvasc increased to 10 mg daily  . Hypothyroidism -Stable, medication was restarted   Dwon Sky 12/13/2017, 12:53 AM

## 2017-12-13 NOTE — Discharge Instructions (Signed)

## 2017-12-13 NOTE — Discharge Summary (Addendum)
[facility] Discharge Summary   Patient: Tina Hester WUJ:[id number]   PCP: Jonathon Jordan, MD DOB: 1940/11/20   Date of admission: 12/12/2017   Date of discharge:  12/13/2017    Discharge Diagnoses:  Principal Problem:   Postural dizziness with presyncope Active Problems:   DM2 (diabetes mellitus, type 2) (HCC)   Gout   Hypertension   OSA on CPAP   Hypothyroidism   Chest pain   Hyperkalemia   Acute kidney injury ([place])   Fluid overload   Bradycardia   CHF (congestive heart failure) ([place])   Admitted From: home Disposition:  Home with home health  Recommendations for Outpatient Follow-up:  1. Please follow-up with PCP in 1 week, for blood pressure management. 2. Cardiology for event monitor placement as well as for further discussion after that regarding the results.  Follow-up Information    [facility] Follow up on 12/20/2017.   Specialty:  Cardiology Why:  11:00AM, heart monitor hook-up Contact information: [address] [place]       Charlie Pitter, PA-C Follow up on 02/02/2018.   Specialties:  Cardiology, Radiology Why:  10:00AM this is one of Dr. Theodosia Blender physician assistants to follow up on heart monitor results Contact information: [address] [place] [address] [phone]        Jonathon Jordan, MD. Schedule an appointment as soon as possible for a visit in 1 week(s).   Specialty:  Family Medicine Contact information: [address]  [place] [address] [phone]          Diet recommendation: Cardiac diet  Activity: The patient is advised to gradually reintroduce usual activities.  Discharge Condition: good  Code Status: Full code  History of present illness: As per the H and P dictated on admission, "this is a 77y/o female who presernts after she reports having mild dizzy spell on and off, today she has a  dizzy spell where she almost passed out. She checked he heart rate and it was 38-low 40's. She felt very weak and terrible. She denies chest pains but states she was just uncomfortable. She states she was a bit SOB. Patient in metoperolol for a fib, she has been on this for years. Here in tge ER the patient is relatively bradycardic with heart rates between 48- 51. No recent fever, chills, nausea, vomiting. She reports having chronic diarrhea,whgicgh believes it is d/t her magnesium that she takes for leg cramps. Her diarhhea is no worse than baseline. History provided by the patient."  Hospital Course:  Summary of her active problems in the hospital is as following. Principal Problem:   dizziness with presyncope Sinus bradycardia Paroxysmal A. fib with RVR. On chronic anticoagulation with Eliquis. Rate controlled at home with beta-blocker. Although for last few weeks patient has recurrent dizziness with heart rate running in 30s during these episodes. Electrophysiology was consulted, recommend to stop Toprol-XL and start the patient on dronedarone. Patient will also get a 30-day event monitor for further evaluation of her symptoms. Appreciate EP assistance, patient will follow up with primary cardiologist.  Essential hypertension. Symptomatic bradycardia. Hyperkalemia, chronic kidney disease stage III Patient's home regimen include 10 mg Norvasc, 25 mg Toprol-XL, 20 mg lisinopril, 12.5 mg HCTZ. Patient presents with symptomatic bradycardia with blood pressure remains elevated. Toprol-XL has been discontinued due to bradycardia. Also potassium 6.9 on admission 4.8 right now, it appears that patient has consistently high levels  of potassium on her blood work and patient verbalizes that she eats a diet that includes frequent use of banana potato and tomato. At present we will increase HCTZ to 25 mg, continue home regimen of Norvasc, discontinue lisinopril and Toprol on discharge.  Type 2  diabetes mellitus. Uncontrolled with hyperglycemia as well as with diabetic nephropathy. Patient follows up with nephrology as an outpatient. Continuing current home regimen for now.  Acute on chronic diastolic CHF. Received IV Lasix for volume overload. Suspect volume overload is due to A. fib. Resuming home regimen on discharge. Outpatient follow-up with cardiology.    OSA on CPAP Continue CPAP at home.    Hypothyroidism Recently Synthroid dose medication was increased. Outpatient follow-up with endocrinology.    Chest pain Noncardiac, no further workup recommended by cardiology at present. Outpatient follow-up with cardiology.  All other chronic medical condition were stable during the hospitalization.   On the day of the discharge the patient's vitals were stable, and no other acute medical condition were reported by patient. the patient was felt safe to be discharge at home  with home health for gait training.  Procedures and Results:  none   Consultations:  EP  DISCHARGE MEDICATION: Allergies as of 12/13/2017      Reactions   Pneumococcal Vaccine Anaphylaxis   weakness   Pneumovax 23 [pneumococcal Vac Polyvalent] Anaphylaxis   weakness   Other Hives   Other reaction(s): Other (See Comments) Pneumonia  Vaccine- very ill   Sulfa Antibiotics Hives      Medication List    STOP taking these medications   hydrochlorothiazide 12.5 MG capsule Commonly known as:  MICROZIDE Replaced by:  hydrochlorothiazide 25 MG tablet   lisinopril 20 MG tablet Commonly known as:  PRINIVIL,ZESTRIL   metoprolol succinate 25 MG 24 hr tablet Commonly known as:  TOPROL-XL     TAKE these medications   acyclovir 400 MG tablet Commonly known as:  ZOVIRAX Take 400 mg by mouth 2 (two) times daily.   ALPRAZolam 0.25 MG tablet Commonly known as:  XANAX Take 0.25 mg by mouth at bedtime as needed for anxiety.   amLODipine 5 MG tablet Commonly known as:  NORVASC Take 1 tablet (5  mg total) by mouth daily.   atorvastatin 40 MG tablet Commonly known as:  LIPITOR Take 1 tablet (40 mg total) by mouth daily at 6 PM.   CALCIUM PO Take 1 tablet by mouth daily.   COLCRYS 0.6 MG tablet Generic drug:  colchicine Take 0.6 mg by mouth daily as needed (gout).   dronedarone 400 MG tablet Commonly known as:  MULTAQ Take 1 tablet (400 mg total) by mouth 2 (two) times daily with a meal.   ELIQUIS 5 MG Tabs tablet Generic drug:  apixaban TAKE 1 TABLET(5 MG) BY MOUTH TWICE DAILY   gabapentin 100 MG capsule Commonly known as:  NEURONTIN Take 100-200 mg by mouth at bedtime.   glucose blood test strip Commonly known as:  FREESTYLE LITE Use to test blood sugar twice daily. Dx: E11.9   HUMULIN N 100 UNIT/ML injection Generic drug:  insulin NPH Human Inject 20-25 Units into the skin See admin instructions. INJECT 20 UNITS EVERY MORNING AND 25 UNITS EVERY NIGHT   hydrochlorothiazide 25 MG tablet Commonly known as:  HYDRODIURIL Take 1 tablet (25 mg total) by mouth daily. Replaces:  hydrochlorothiazide 12.5 MG capsule   insulin regular 100 units/mL injection Commonly known as:  NOVOLIN R RELION Inject 0.06-0.1 mLs (6-10 Units total) into  the skin 3 (three) times daily before meals. What changed:    when to take this  reasons to take this   INSULIN SYRINGE .5CC/30GX5/16" 30G X 5/16" 0.5 ML Misc Use 2 needles per day to inject insulin   levothyroxine 150 MCG tablet Commonly known as:  SYNTHROID, LEVOTHROID Take 1 tablet (150 mcg total) by mouth daily before breakfast.   magnesium 30 MG tablet Take 30 mg by mouth at bedtime.   metFORMIN 500 MG tablet Commonly known as:  GLUCOPHAGE TAKE 1 TABLET BY MOUTH EVERY MORNING AND 2 TABLETS BY MOUTH EVERY EVENING   NEEDLE (DISP) 30 G 30G X 1/2" Misc Commonly known as:  BD DISP NEEDLES 1 each by Does not apply route 2 (two) times daily before a meal.   ONETOUCH DELICA LANCETS 29U Misc Use to check blood sugars once  daily   pioglitazone 15 MG tablet Commonly known as:  ACTOS TAKE 1 TABLET BY MOUTH EVERY DAY   prednisoLONE acetate 1 % ophthalmic suspension Commonly known as:  PRED FORTE Place 2 drops into the right eye daily.   timolol 0.5 % ophthalmic solution Commonly known as:  TIMOPTIC USE 1 DROP IN RIGHT EYE D   vitamin B-12 1000 MCG tablet Commonly known as:  CYANOCOBALAMIN Take 1,000 mcg by mouth daily.   Vitamin D2 2000 units Tabs Take 2,000 Units by mouth daily.      Allergies  Allergen Reactions  . Pneumococcal Vaccine Anaphylaxis    weakness  . Pneumovax 23 [Pneumococcal Vac Polyvalent] Anaphylaxis    weakness  . Other Hives    Other reaction(s): Other (See Comments) Pneumonia  Vaccine- very ill  . Sulfa Antibiotics Hives    Discharge Exam: Filed Weights   12/12/17 2215 12/13/17 0400  Weight: 108.9 kg (240 lb) 111 kg (244 lb 11.4 oz)   Vitals:   12/13/17 1003 12/13/17 1229  BP: (!) 146/40 (!) 129/44  Pulse:    Resp:  18  Temp:  97.6 F (36.4 C)  SpO2:     General: Appear in no distress, no Rash; Oral Mucosa moist Cardiovascular: S1 and S2 Present, no Murmur, no JVD Respiratory: Bilateral Air entry present and Clear to Auscultation, no Crackles, no wheezes Abdomen: Bowel Sound present, Soft and no tenderness Extremities: no Pedal edema, no calf tenderness Neurology: Grossly no focal neuro deficit.  The results of significant diagnostics from this hospitalization (including imaging, microbiology, ancillary and laboratory) are listed below for reference.    Significant Diagnostic Studies: Dg Chest 2 View  Result Date: 12/12/2017 CLINICAL DATA:  Weakness short of breath EXAM: CHEST - 2 VIEW COMPARISON:  None. FINDINGS: Postsurgical changes in the cervical spine. Hyperinflation. No pleural effusion. Cardiomegaly with mild vascular congestion. Mild diffuse interstitial opacity. Aortic atherosclerosis. No pneumothorax. IMPRESSION: 1. Cardiomegaly with mild central  congestion and suspected minimal interstitial edema. 2. No focal airspace disease Electronically Signed   By: Donavan Foil M.D.   On: 12/12/2017 23:01    Microbiology: Recent Results (from the past 240 hour(s))  MRSA PCR Screening     Status: None   Collection Time: 12/13/17  5:04 AM  Result Value Ref Range Status   MRSA by PCR NEGATIVE NEGATIVE Final    Comment:        The GeneXpert MRSA Assay (FDA approved for NASAL specimens only), is one component of a comprehensive MRSA colonization surveillance program. It is not intended to diagnose MRSA infection nor to guide or monitor treatment for MRSA infections.  Performed at [facility], [place] [address] [place], [place] [address]      Labs: CBC: Recent Labs  Lab 12/12/17 2240 12/13/17 1001  WBC 7.6 9.3  HGB 11.4* 12.6  HCT 35.3* 39.7  MCV 94.6 93.9  PLT 211 413   Basic Metabolic Panel: Recent Labs  Lab 12/12/17 2240 12/13/17 0219 12/13/17 1001  NA 136  --  137  K 6.7* 6.9* 4.8  CL 104  --  101  CO2 24  --  23  GLUCOSE 184*  --  222*  BUN 28*  --  25*  CREATININE 1.39*  --  1.29*  CALCIUM 8.7*  --  9.4   Liver Function Tests: No results for input(s): AST, ALT, ALKPHOS, BILITOT, PROT, ALBUMIN in the last 168 hours. No results for input(s): LIPASE, AMYLASE in the last 168 hours. No results for input(s): AMMONIA in the last 168 hours. Cardiac Enzymes: Recent Labs  Lab 12/13/17 0219 12/13/17 1001  TROPONINI <0.03 <0.03   BNP (last 3 results) Recent Labs    12/13/17 0219  BNP 243.0*   CBG: Recent Labs  Lab 12/13/17 0435 12/13/17 0851 12/13/17 1231  GLUCAP 194* 214* 174*   Time spent: 35 minutes  Signed:  Berle Mull  [facility]  12/13/2017  , 4:12 PM

## 2017-12-13 NOTE — ED Notes (Signed)
Hospitalist at bedside 

## 2017-12-13 NOTE — Care Management (Signed)
CM informed by pts pharmacy that [facility] requires prior auth. CM spoke with Cards PA and provided prior auth information.  CM also informed bedside nurse of discharge barrier  Prior authorization [phone] ID # [id number]

## 2017-12-13 NOTE — Progress Notes (Addendum)
[name] [phone] for prior auth for Multaq Case # is [id number] P.auth was placed for urgent auth, was told can take up to 24 hours. In discussion with Dr. Curt Bears, [place] to discharge and fill tomorrow to get started. I have spoken with RN, she will advise patient to call our office or the AFib clinic tomorrow if unable to get medication. I have made arrangements for AF clinic appointment for Friday  Tina Hester, [place]

## 2017-12-13 NOTE — Care Management Obs Status (Signed)
[facility] NOTIFICATION   Patient Details  Name: Tina Hester MRN: [id number] Date of Birth: [birth year]   Medicare Observation Status Notification Given:  Yes    Maryclare Labrador, RN 12/13/2017, 3:55 PM

## 2017-12-13 NOTE — Progress Notes (Signed)
Discharged home by wheelchair accompanied by daughter.

## 2017-12-13 NOTE — Care Management Note (Signed)
Case Management Note  Patient Details  Name: Tina Hester MRN: [id number] Date of Birth: Apr 05, 1941  Subjective/Objective:     Pt admitted syncope               Action/Plan:   PTA from home with daughter - independent.  Pt given choice of [facility] agency - pt chose [facility] - agency contacted and referral accepted   Expected Discharge Date:  12/13/17               Expected Discharge Plan:  [facility](independent from home)  In-House Referral:     Discharge planning Services  CM Consult  Post Acute Care Choice:    Choice offered to:  Patient  DME Arranged:    DME Agency:     HH Arranged:    [place] Agency:  [facility]  Status of Service:  Completed, signed off  If discussed at [place] of Stay Meetings, dates discussed:    Additional Comments:  Maryclare Labrador, RN 12/13/2017, 3:51 PM

## 2017-12-14 ENCOUNTER — Telehealth: Payer: Self-pay | Admitting: Physician Assistant

## 2017-12-14 ENCOUNTER — Telehealth: Payer: Self-pay

## 2017-12-14 NOTE — Telephone Encounter (Signed)
Multaq approval received via fax from [facility]. Approval good from 12/13/2017 until 08/29/2018.  I have notified the pts pharmacy.

## 2017-12-14 NOTE — Telephone Encounter (Signed)
F/U on Multaq, was approved, effective dates 12/13/17-08/29/18 Auth/case # [id number] Called patient to confirm, she was able to pick up her medicine without difficulty and has started it.  feling well without any concerns, was appreciative of the f/u.  Tommye Standard, PA-C

## 2017-12-15 NOTE — Telephone Encounter (Signed)
Pt called back and was informed that Dr. Dwyane Dee called in new RX for thyroid medication since he increased the dosage. Pt stated that she went to get the old script filled and found out that the dosage had change and that she began taking the new updated dosage yesterday.

## 2017-12-16 ENCOUNTER — Encounter (HOSPITAL_COMMUNITY): Payer: Self-pay | Admitting: Nurse Practitioner

## 2017-12-16 ENCOUNTER — Ambulatory Visit (HOSPITAL_COMMUNITY)
Admit: 2017-12-16 | Discharge: 2017-12-16 | Disposition: A | Discharge status: Home / Self Care | Payer: PPO | Source: Ambulatory Visit | Attending: Nurse Practitioner | Admitting: Nurse Practitioner

## 2017-12-16 VITALS — BP 129/68 | HR 127 | Ht 61.0 in | Wt 243.6 lb

## 2017-12-16 DIAGNOSIS — Z7901 Long term (current) use of anticoagulants: Secondary | ICD-10-CM | POA: Diagnosis not present

## 2017-12-16 DIAGNOSIS — I480 Paroxysmal atrial fibrillation: Secondary | ICD-10-CM

## 2017-12-16 DIAGNOSIS — Z887 Allergy status to serum and vaccine status: Secondary | ICD-10-CM | POA: Insufficient documentation

## 2017-12-16 DIAGNOSIS — E1122 Type 2 diabetes mellitus with diabetic chronic kidney disease: Secondary | ICD-10-CM | POA: Insufficient documentation

## 2017-12-16 DIAGNOSIS — Z6841 Body Mass Index (BMI) 40.0 and over, adult: Secondary | ICD-10-CM | POA: Insufficient documentation

## 2017-12-16 DIAGNOSIS — M1990 Unspecified osteoarthritis, unspecified site: Secondary | ICD-10-CM | POA: Diagnosis not present

## 2017-12-16 DIAGNOSIS — K219 Gastro-esophageal reflux disease without esophagitis: Secondary | ICD-10-CM | POA: Insufficient documentation

## 2017-12-16 DIAGNOSIS — E785 Hyperlipidemia, unspecified: Secondary | ICD-10-CM | POA: Insufficient documentation

## 2017-12-16 DIAGNOSIS — Z794 Long term (current) use of insulin: Secondary | ICD-10-CM | POA: Insufficient documentation

## 2017-12-16 DIAGNOSIS — Z8249 Family history of ischemic heart disease and other diseases of the circulatory system: Secondary | ICD-10-CM | POA: Diagnosis not present

## 2017-12-16 DIAGNOSIS — G4733 Obstructive sleep apnea (adult) (pediatric): Secondary | ICD-10-CM | POA: Insufficient documentation

## 2017-12-16 DIAGNOSIS — I272 Pulmonary hypertension, unspecified: Secondary | ICD-10-CM | POA: Diagnosis not present

## 2017-12-16 DIAGNOSIS — Z8673 Personal history of transient ischemic attack (TIA), and cerebral infarction without residual deficits: Secondary | ICD-10-CM | POA: Insufficient documentation

## 2017-12-16 DIAGNOSIS — M8580 Other specified disorders of bone density and structure, unspecified site: Secondary | ICD-10-CM | POA: Insufficient documentation

## 2017-12-16 DIAGNOSIS — N183 Chronic kidney disease, stage 3 (moderate): Secondary | ICD-10-CM | POA: Insufficient documentation

## 2017-12-16 DIAGNOSIS — M109 Gout, unspecified: Secondary | ICD-10-CM | POA: Diagnosis not present

## 2017-12-16 DIAGNOSIS — Z882 Allergy status to sulfonamides status: Secondary | ICD-10-CM | POA: Insufficient documentation

## 2017-12-16 DIAGNOSIS — I350 Nonrheumatic aortic (valve) stenosis: Secondary | ICD-10-CM | POA: Diagnosis not present

## 2017-12-16 DIAGNOSIS — F419 Anxiety disorder, unspecified: Secondary | ICD-10-CM | POA: Insufficient documentation

## 2017-12-16 DIAGNOSIS — Z79899 Other long term (current) drug therapy: Secondary | ICD-10-CM | POA: Diagnosis not present

## 2017-12-16 DIAGNOSIS — Z981 Arthrodesis status: Secondary | ICD-10-CM | POA: Insufficient documentation

## 2017-12-16 DIAGNOSIS — I10 Essential (primary) hypertension: Secondary | ICD-10-CM | POA: Insufficient documentation

## 2017-12-16 DIAGNOSIS — I129 Hypertensive chronic kidney disease with stage 1 through stage 4 chronic kidney disease, or unspecified chronic kidney disease: Secondary | ICD-10-CM | POA: Diagnosis not present

## 2017-12-16 DIAGNOSIS — Z90710 Acquired absence of both cervix and uterus: Secondary | ICD-10-CM | POA: Diagnosis not present

## 2017-12-16 DIAGNOSIS — Z9049 Acquired absence of other specified parts of digestive tract: Secondary | ICD-10-CM | POA: Insufficient documentation

## 2017-12-16 MED ORDER — METOPROLOL SUCCINATE ER 25 MG PO TB24: 12.5000 mg | ORAL_TABLET | Freq: Every day | ORAL | 11 refills | Status: DC

## 2017-12-16 NOTE — Patient Instructions (Signed)
Start metoprolol 1/2 tablet once a day

## 2017-12-16 NOTE — Progress Notes (Signed)
Primary Care Physician: Jonathon Jordan, MD Referring Physician: Marinus Maw, PA Cardiologist: Dr. [name] is a 77 y.o. female with a h/o paroxysmal afib that was treated with BB'S. Maintaining SR. She presented to the ER for feeling poorly and was found to have brady in the 30's and 40's. Her K+ was also elevated at 6.9 and normalized to 4.8. Cardiology was consulted and recommended stopping BB, starting multaq and setting up for event monitor.  In the clinic today, she was staying in [place] but felt like she went into afib this am.  EKG shows afib at 127 bpm.   Today, she denies symptoms of palpitations, chest pain, shortness of breath, orthopnea, PND, lower extremity edema, dizziness, presyncope, syncope, or neurologic sequela. The patient is tolerating medications without difficulties and is otherwise without complaint today.   Past Medical History:  Diagnosis Date  . Anemia    s/p Heme work -up normal EGD and colonscopy in 2012 per pt,neg SPEP  . Anxiety   . Aortic stenosis    very mild by echo 05/2017  . Bilateral carotid artery stenosis 06/08/2015   1-39% bilateral  . CKD (chronic kidney disease), stage III ([place])   . Diabetes mellitus without complication ([place])    type 2  . GERD (gastroesophageal reflux disease)   . Gout   . HSV-1 (herpes simplex virus 1) infection    Acyclovir prn  . Hyperlipidemia   . Hypertension   . Joint pain    osteoarthritis by Xray- possible erosion of R 4th MCP,elevated uric  acid ,ANA +   . Morbid obesity ([place])   . OSA on CPAP   . Osteopenia   . PAF (paroxysmal atrial fibrillation) ([place])   . Pain management    Neurosurg: Dr Clydell Hakim  . Pulmonary HTN ([place])    Moderate by echo  2016. PASP 34mmHg  . TIA (transient ischemic attack)    remote history of TIA's early 2000   Past Surgical History:  Procedure Laterality Date  . ABDOMINAL HYSTERECTOMY    . APPENDECTOMY    . BACK SURGERY     cervical fusion  . BLADDER  SURGERY    . CESAREAN SECTION    . CHOLECYSTECTOMY      Current Outpatient Medications  Medication Sig Dispense Refill  . acyclovir (ZOVIRAX) 400 MG tablet Take 400 mg by mouth 2 (two) times daily.     Marland Kitchen ALPRAZolam (XANAX) 0.25 MG tablet Take 0.25 mg by mouth at bedtime as needed for anxiety.    Marland Kitchen amLODipine (NORVASC) 5 MG tablet Take 1 tablet (5 mg total) by mouth daily. 90 tablet 2  . atorvastatin (LIPITOR) 40 MG tablet Take 1 tablet (40 mg total) by mouth daily at 6 PM. 90 tablet 2  . CALCIUM PO Take 1 tablet by mouth daily.     Marland Kitchen COLCRYS 0.6 MG tablet Take 0.6 mg by mouth daily as needed (gout).     Marland Kitchen dronedarone (MULTAQ) 400 MG tablet Take 1 tablet (400 mg total) by mouth 2 (two) times daily with a meal. 60 tablet 6  . ELIQUIS 5 MG TABS tablet TAKE 1 TABLET(5 MG) BY MOUTH TWICE DAILY 180 tablet 1  . Ergocalciferol (VITAMIN D2) 2000 units TABS Take 2,000 Units by mouth daily.     Marland Kitchen gabapentin (NEURONTIN) 100 MG capsule Take 100-200 mg by mouth at bedtime.     Marland Kitchen glucose blood (FREESTYLE LITE) test strip Use to test blood  sugar twice daily. Dx: E11.9 200 each 1  . hydrochlorothiazide (HYDRODIURIL) 25 MG tablet Take 1 tablet (25 mg total) by mouth daily. 30 tablet 0  . insulin NPH Human (HUMULIN N) 100 UNIT/ML injection Inject 20-25 Units into the skin See admin instructions. INJECT 20 UNITS EVERY MORNING AND 25 UNITS EVERY NIGHT    . insulin regular (NOVOLIN R RELION) 100 units/mL injection Inject 0.06-0.1 mLs (6-10 Units total) into the skin 3 (three) times daily before meals. (Patient taking differently: Inject 6-10 Units into the skin 2 (two) times daily as needed (only when eating a big meal). ) 10 mL 1  . Insulin Syringe-Needle U-100 (INSULIN SYRINGE .5CC/30GX5/16") 30G X 5/16" 0.5 ML MISC Use 2 needles per day to inject insulin 60 each 5  . levothyroxine (SYNTHROID, LEVOTHROID) 150 MCG tablet Take 1 tablet (150 mcg total) by mouth daily before breakfast. 30 tablet 2  . magnesium 30 MG  tablet Take 30 mg by mouth at bedtime.     . metFORMIN (GLUCOPHAGE) 500 MG tablet TAKE 1 TABLET BY MOUTH EVERY MORNING AND 2 TABLETS BY MOUTH EVERY EVENING 90 tablet 0  . NEEDLE, DISP, 30 G (BD DISP NEEDLES) 30G X 1/2" MISC 1 each by Does not apply route 2 (two) times daily before a meal. 100 each 5  . ONETOUCH DELICA LANCETS 95K MISC Use to check blood sugars once daily 50 each 3  . pioglitazone (ACTOS) 15 MG tablet TAKE 1 TABLET BY MOUTH EVERY DAY 90 tablet 0  . prednisoLONE acetate (PRED FORTE) 1 % ophthalmic suspension Place 2 drops into the right eye daily.  2  . timolol (TIMOPTIC) 0.5 % ophthalmic solution USE 1 DROP IN RIGHT EYE D  1  . vitamin B-12 (CYANOCOBALAMIN) 1000 MCG tablet Take 1,000 mcg by mouth daily.    . vitamin C (ASCORBIC ACID) 500 MG tablet Take 500 mg by mouth daily.    . metoprolol succinate (TOPROL XL) 25 MG 24 hr tablet Take 0.5 tablets (12.5 mg total) by mouth daily. 30 tablet 11   No current facility-administered medications for this encounter.     Allergies  Allergen Reactions  . Pneumococcal Vaccine Anaphylaxis    weakness  . Pneumovax 23 [Pneumococcal Vac Polyvalent] Anaphylaxis    weakness  . Other Hives    Other reaction(s): Other (See Comments) Pneumonia  Vaccine- very ill  . Sulfa Antibiotics Hives    Social History   Socioeconomic History  . Marital status: Divorced    Spouse name: Not on file  . Number of children: Not on file  . Years of education: Not on file  . Highest education level: Not on file  Occupational History  . Not on file  Social Needs  . Financial resource strain: Not on file  . Food insecurity:    Worry: Not on file    Inability: Not on file  . Transportation needs:    Medical: Not on file    Non-medical: Not on file  Tobacco Use  . Smoking status: Never Smoker  . Smokeless tobacco: Never Used  Substance and Sexual Activity  . Alcohol use: No  . Drug use: No  . Sexual activity: Not on file  Lifestyle  .  Physical activity:    Days per week: Not on file    Minutes per session: Not on file  . Stress: Not on file  Relationships  . Social connections:    Talks on phone: Not on file  Gets together: Not on file    Attends religious service: Not on file    Active member of club or organization: Not on file    Attends meetings of clubs or organizations: Not on file    Relationship status: Not on file  . Intimate partner violence:    Fear of current or ex partner: Not on file    Emotionally abused: Not on file    Physically abused: Not on file    Forced sexual activity: Not on file  Other Topics Concern  . Not on file  Social History Narrative  . Not on file    Family History  Problem Relation Age of Onset  . Arrhythmia Brother     ROS- All systems are reviewed and negative except as per the HPI above  Physical Exam: Vitals:   12/16/17 1342  BP: 129/68  Pulse: (!) 127  Weight: 243 lb 9.6 oz (110.5 kg)  Height: 5\' 1"  (1.549 m)   Wt Readings from Last 3 Encounters:  12/16/17 243 lb 9.6 oz (110.5 kg)  12/13/17 244 lb 11.4 oz (111 kg)  10/18/17 246 lb 12.8 oz (111.9 kg)    Labs: Lab Results  Component Value Date   NA 137 12/13/2017   K 4.8 12/13/2017   CL 101 12/13/2017   CO2 23 12/13/2017   GLUCOSE 222 (H) 12/13/2017   BUN 25 (H) 12/13/2017   CREATININE 1.29 (H) 12/13/2017   CALCIUM 9.4 12/13/2017   No results found for: INR Lab Results  Component Value Date   CHOL 197 12/13/2017   HDL 30 (L) 12/13/2017   LDLCALC 112 (H) 12/13/2017   TRIG 273 (H) 12/13/2017     GEN- The patient is well appearing, alert and oriented x 3 today.   Head- normocephalic, atraumatic Eyes-  Sclera clear, conjunctiva pink Ears- hearing intact Oropharynx- clear Neck- supple, no JVP Lymph- no cervical lymphadenopathy Lungs- Clear to ausculation bilaterally, normal work of breathing Heart- Regular rate and rhythm, no murmurs, rubs or gallops, PMI not laterally displaced GI- soft,  NT, ND, + BS Extremities- no clubbing, cyanosis, or edema MS- no significant deformity or atrophy Skin- no rash or lesion Psych- euthymic mood, full affect Neuro- strength and sensation are intact  EKG- Afib at 127 bpm, qrs int 104 ms, qtc 485 ms    Assessment and Plan: 1. Paroxysmal afib Possibly early brady/tachy syndrome Stopped bb and started multaq in hospital Hyperkalemia may have also contributed to brady  Now with afib with rvr Will restart   low dose BB, metoprolol succinate 25 mg 1/2 tab qd, for rate control Continue multaq 400 mg bid Scheduled to get heart monitor Tuesday at 11 am  Will see back at 11:30 Tuesday If needs further guidance over the weekend, call answering service at [facility] or to [place]. [place], [facility] [address] [place], [place] [address] [phone]

## 2017-12-19 ENCOUNTER — Other Ambulatory Visit: Payer: Self-pay | Admitting: Endocrinology

## 2017-12-19 DIAGNOSIS — I350 Nonrheumatic aortic (valve) stenosis: Secondary | ICD-10-CM | POA: Diagnosis not present

## 2017-12-19 DIAGNOSIS — I480 Paroxysmal atrial fibrillation: Secondary | ICD-10-CM | POA: Diagnosis not present

## 2017-12-19 DIAGNOSIS — I509 Heart failure, unspecified: Secondary | ICD-10-CM | POA: Diagnosis not present

## 2017-12-19 DIAGNOSIS — D631 Anemia in chronic kidney disease: Secondary | ICD-10-CM | POA: Diagnosis not present

## 2017-12-19 DIAGNOSIS — G4733 Obstructive sleep apnea (adult) (pediatric): Secondary | ICD-10-CM | POA: Diagnosis not present

## 2017-12-19 DIAGNOSIS — F419 Anxiety disorder, unspecified: Secondary | ICD-10-CM | POA: Diagnosis not present

## 2017-12-19 DIAGNOSIS — M109 Gout, unspecified: Secondary | ICD-10-CM | POA: Diagnosis not present

## 2017-12-19 DIAGNOSIS — N183 Chronic kidney disease, stage 3 (moderate): Secondary | ICD-10-CM | POA: Diagnosis not present

## 2017-12-19 DIAGNOSIS — I130 Hypertensive heart and chronic kidney disease with heart failure and stage 1 through stage 4 chronic kidney disease, or unspecified chronic kidney disease: Secondary | ICD-10-CM | POA: Diagnosis not present

## 2017-12-19 DIAGNOSIS — I272 Pulmonary hypertension, unspecified: Secondary | ICD-10-CM | POA: Diagnosis not present

## 2017-12-19 DIAGNOSIS — E1122 Type 2 diabetes mellitus with diabetic chronic kidney disease: Secondary | ICD-10-CM | POA: Diagnosis not present

## 2017-12-19 DIAGNOSIS — R2689 Other abnormalities of gait and mobility: Secondary | ICD-10-CM | POA: Diagnosis not present

## 2017-12-19 DIAGNOSIS — K219 Gastro-esophageal reflux disease without esophagitis: Secondary | ICD-10-CM | POA: Diagnosis not present

## 2017-12-20 ENCOUNTER — Encounter (HOSPITAL_COMMUNITY): Payer: Self-pay | Admitting: Nurse Practitioner

## 2017-12-20 ENCOUNTER — Ambulatory Visit (HOSPITAL_COMMUNITY)
Admission: RE | Admit: 2017-12-20 | Discharge: 2017-12-20 | Disposition: A | Discharge status: Home / Self Care | Payer: PPO | Source: Ambulatory Visit | Attending: Nurse Practitioner | Admitting: Nurse Practitioner

## 2017-12-20 ENCOUNTER — Ambulatory Visit (INDEPENDENT_AMBULATORY_CARE_PROVIDER_SITE_OTHER): Payer: PPO

## 2017-12-20 VITALS — BP 124/56 | HR 60 | Ht 61.0 in | Wt 246.0 lb

## 2017-12-20 DIAGNOSIS — E875 Hyperkalemia: Secondary | ICD-10-CM | POA: Diagnosis not present

## 2017-12-20 DIAGNOSIS — E785 Hyperlipidemia, unspecified: Secondary | ICD-10-CM | POA: Insufficient documentation

## 2017-12-20 DIAGNOSIS — I272 Pulmonary hypertension, unspecified: Secondary | ICD-10-CM | POA: Insufficient documentation

## 2017-12-20 DIAGNOSIS — Z7901 Long term (current) use of anticoagulants: Secondary | ICD-10-CM | POA: Diagnosis not present

## 2017-12-20 DIAGNOSIS — Z79899 Other long term (current) drug therapy: Secondary | ICD-10-CM | POA: Insufficient documentation

## 2017-12-20 DIAGNOSIS — N183 Chronic kidney disease, stage 3 (moderate): Secondary | ICD-10-CM | POA: Diagnosis not present

## 2017-12-20 DIAGNOSIS — G4733 Obstructive sleep apnea (adult) (pediatric): Secondary | ICD-10-CM | POA: Insufficient documentation

## 2017-12-20 DIAGNOSIS — Z882 Allergy status to sulfonamides status: Secondary | ICD-10-CM | POA: Insufficient documentation

## 2017-12-20 DIAGNOSIS — R55 Syncope and collapse: Secondary | ICD-10-CM | POA: Diagnosis not present

## 2017-12-20 DIAGNOSIS — M8580 Other specified disorders of bone density and structure, unspecified site: Secondary | ICD-10-CM | POA: Insufficient documentation

## 2017-12-20 DIAGNOSIS — Z8673 Personal history of transient ischemic attack (TIA), and cerebral infarction without residual deficits: Secondary | ICD-10-CM | POA: Diagnosis not present

## 2017-12-20 DIAGNOSIS — R001 Bradycardia, unspecified: Secondary | ICD-10-CM

## 2017-12-20 DIAGNOSIS — K219 Gastro-esophageal reflux disease without esophagitis: Secondary | ICD-10-CM | POA: Diagnosis not present

## 2017-12-20 DIAGNOSIS — Z90710 Acquired absence of both cervix and uterus: Secondary | ICD-10-CM | POA: Diagnosis not present

## 2017-12-20 DIAGNOSIS — I129 Hypertensive chronic kidney disease with stage 1 through stage 4 chronic kidney disease, or unspecified chronic kidney disease: Secondary | ICD-10-CM | POA: Diagnosis not present

## 2017-12-20 DIAGNOSIS — Z8249 Family history of ischemic heart disease and other diseases of the circulatory system: Secondary | ICD-10-CM | POA: Diagnosis not present

## 2017-12-20 DIAGNOSIS — Z6841 Body Mass Index (BMI) 40.0 and over, adult: Secondary | ICD-10-CM | POA: Diagnosis not present

## 2017-12-20 DIAGNOSIS — Z79890 Hormone replacement therapy (postmenopausal): Secondary | ICD-10-CM | POA: Insufficient documentation

## 2017-12-20 DIAGNOSIS — Z887 Allergy status to serum and vaccine status: Secondary | ICD-10-CM | POA: Insufficient documentation

## 2017-12-20 DIAGNOSIS — M109 Gout, unspecified: Secondary | ICD-10-CM | POA: Diagnosis not present

## 2017-12-20 DIAGNOSIS — I5043 Acute on chronic combined systolic (congestive) and diastolic (congestive) heart failure: Secondary | ICD-10-CM | POA: Diagnosis not present

## 2017-12-20 DIAGNOSIS — M1990 Unspecified osteoarthritis, unspecified site: Secondary | ICD-10-CM | POA: Insufficient documentation

## 2017-12-20 DIAGNOSIS — I480 Paroxysmal atrial fibrillation: Secondary | ICD-10-CM

## 2017-12-20 DIAGNOSIS — Z794 Long term (current) use of insulin: Secondary | ICD-10-CM | POA: Diagnosis not present

## 2017-12-20 DIAGNOSIS — Z9049 Acquired absence of other specified parts of digestive tract: Secondary | ICD-10-CM | POA: Diagnosis not present

## 2017-12-20 DIAGNOSIS — F419 Anxiety disorder, unspecified: Secondary | ICD-10-CM | POA: Insufficient documentation

## 2017-12-20 DIAGNOSIS — E1122 Type 2 diabetes mellitus with diabetic chronic kidney disease: Secondary | ICD-10-CM | POA: Insufficient documentation

## 2017-12-20 DIAGNOSIS — I4891 Unspecified atrial fibrillation: Secondary | ICD-10-CM | POA: Diagnosis not present

## 2017-12-20 NOTE — Progress Notes (Signed)
Primary Care Physician: Jonathon Jordan, MD Referring Physician: Marinus Maw, PA Cardiologist: Dr. Nicholes Mango is a 77 y.o. female with a h/o paroxysmal afib that was treated with BB'S, maintaining SR. She presented to the ER for feeling poorly and was found to have brady in the 30's and 40's. Her K+ was also elevated at 6.9 and normalized to 4.8. Cardiology was consulted and recommended stopping BB, starting multaq and setting up for event monitor.  In the clinic, 4/19, she was staying in [place] but felt like she went into afib this am.  EKG shows afib at 127 bpm.  12/tab metoprolol was added.  F/u 4/23. She is in SR. Does not feel like he has had any further afib. She is now holding metoprolol again as her rate in SR is 55-60 bpm. She had 30 day event monitor placed this am.   Today, she denies symptoms of palpitations, chest pain, shortness of breath, orthopnea, PND, lower extremity edema, dizziness, presyncope, syncope, or neurologic sequela. The patient is tolerating medications without difficulties and is otherwise without complaint today.   Past Medical History:  Diagnosis Date  . Anemia    s/p Heme work -up normal EGD and colonscopy in 2012 per pt,neg SPEP  . Anxiety   . Aortic stenosis    very mild by echo 05/2017  . Bilateral carotid artery stenosis 06/08/2015   1-39% bilateral  . CKD (chronic kidney disease), stage III ([place])   . Diabetes mellitus without complication ([place])    type 2  . GERD (gastroesophageal reflux disease)   . Gout   . HSV-1 (herpes simplex virus 1) infection    Acyclovir prn  . Hyperlipidemia   . Hypertension   . Joint pain    osteoarthritis by Xray- possible erosion of R 4th MCP,elevated uric  acid ,ANA +   . Morbid obesity ([place])   . OSA on CPAP   . Osteopenia   . PAF (paroxysmal atrial fibrillation) ([place])   . Pain management    Neurosurg: Dr Clydell Hakim  . Pulmonary HTN ([place])    Moderate by echo  2016. PASP 82mmHg  . TIA  (transient ischemic attack)    remote history of TIA's early 2000   Past Surgical History:  Procedure Laterality Date  . ABDOMINAL HYSTERECTOMY    . APPENDECTOMY    . BACK SURGERY     cervical fusion  . BLADDER SURGERY    . CESAREAN SECTION    . CHOLECYSTECTOMY      Current Outpatient Medications  Medication Sig Dispense Refill  . acyclovir (ZOVIRAX) 400 MG tablet Take 400 mg by mouth 2 (two) times daily.     Marland Kitchen ALPRAZolam (XANAX) 0.25 MG tablet Take 0.25 mg by mouth at bedtime as needed for anxiety.    Marland Kitchen amLODipine (NORVASC) 5 MG tablet Take 1 tablet (5 mg total) by mouth daily. 90 tablet 2  . atorvastatin (LIPITOR) 40 MG tablet Take 1 tablet (40 mg total) by mouth daily at 6 PM. 90 tablet 2  . CALCIUM PO Take 1 tablet by mouth daily.     Marland Kitchen COLCRYS 0.6 MG tablet Take 0.6 mg by mouth daily as needed (gout).     Marland Kitchen dronedarone (MULTAQ) 400 MG tablet Take 1 tablet (400 mg total) by mouth 2 (two) times daily with a meal. 60 tablet 6  . ELIQUIS 5 MG TABS tablet TAKE 1 TABLET(5 MG) BY MOUTH TWICE DAILY 180 tablet 1  .  Ergocalciferol (VITAMIN D2) 2000 units TABS Take 2,000 Units by mouth daily.     Marland Kitchen gabapentin (NEURONTIN) 100 MG capsule Take 100-200 mg by mouth at bedtime.     Marland Kitchen glucose blood (FREESTYLE LITE) test strip Use to test blood sugar twice daily. Dx: E11.9 200 each 1  . hydrochlorothiazide (HYDRODIURIL) 25 MG tablet Take 1 tablet (25 mg total) by mouth daily. 30 tablet 0  . insulin NPH Human (HUMULIN N) 100 UNIT/ML injection Inject 20-25 Units into the skin See admin instructions. INJECT 20 UNITS EVERY MORNING AND 25 UNITS EVERY NIGHT    . insulin regular (NOVOLIN R RELION) 100 units/mL injection Inject 0.06-0.1 mLs (6-10 Units total) into the skin 3 (three) times daily before meals. (Patient taking differently: Inject 6-10 Units into the skin 2 (two) times daily as needed (only when eating a big meal). ) 10 mL 1  . Insulin Syringe-Needle U-100 (INSULIN SYRINGE .5CC/30GX5/16") 30G X  5/16" 0.5 ML MISC Use 2 needles per day to inject insulin 60 each 5  . levothyroxine (SYNTHROID, LEVOTHROID) 150 MCG tablet Take 1 tablet (150 mcg total) by mouth daily before breakfast. 30 tablet 2  . magnesium 30 MG tablet Take 30 mg by mouth at bedtime.     . metFORMIN (GLUCOPHAGE) 500 MG tablet TAKE 1 TABLET BY MOUTH EVERY MORNING AND 2 TABLETS EVERY EVENING 90 tablet 0  . metoprolol succinate (TOPROL XL) 25 MG 24 hr tablet Take 0.5 tablets (12.5 mg total) by mouth daily. 30 tablet 11  . NEEDLE, DISP, 30 G (BD DISP NEEDLES) 30G X 1/2" MISC 1 each by Does not apply route 2 (two) times daily before a meal. 100 each 5  . ONETOUCH DELICA LANCETS 35W MISC Use to check blood sugars once daily 50 each 3  . pioglitazone (ACTOS) 15 MG tablet TAKE 1 TABLET BY MOUTH EVERY DAY 90 tablet 0  . prednisoLONE acetate (PRED FORTE) 1 % ophthalmic suspension Place 2 drops into the right eye daily.  2  . timolol (TIMOPTIC) 0.5 % ophthalmic solution USE 1 DROP IN RIGHT EYE D  1  . vitamin B-12 (CYANOCOBALAMIN) 1000 MCG tablet Take 1,000 mcg by mouth daily.    . vitamin C (ASCORBIC ACID) 500 MG tablet Take 500 mg by mouth daily.     No current facility-administered medications for this encounter.     Allergies  Allergen Reactions  . Pneumococcal Vaccine Anaphylaxis    weakness  . Pneumovax 23 [Pneumococcal Vac Polyvalent] Anaphylaxis    weakness  . Other Hives    Other reaction(s): Other (See Comments) Pneumonia  Vaccine- very ill  . Sulfa Antibiotics Hives    Social History   Socioeconomic History  . Marital status: Divorced    Spouse name: Not on file  . Number of children: Not on file  . Years of education: Not on file  . Highest education level: Not on file  Occupational History  . Not on file  Social Needs  . Financial resource strain: Not on file  . Food insecurity:    Worry: Not on file    Inability: Not on file  . Transportation needs:    Medical: Not on file    Non-medical: Not on  file  Tobacco Use  . Smoking status: Never Smoker  . Smokeless tobacco: Never Used  Substance and Sexual Activity  . Alcohol use: No  . Drug use: No  . Sexual activity: Not on file  Lifestyle  . Physical activity:  Days per week: Not on file    Minutes per session: Not on file  . Stress: Not on file  Relationships  . Social connections:    Talks on phone: Not on file    Gets together: Not on file    Attends religious service: Not on file    Active member of club or organization: Not on file    Attends meetings of clubs or organizations: Not on file    Relationship status: Not on file  . Intimate partner violence:    Fear of current or ex partner: Not on file    Emotionally abused: Not on file    Physically abused: Not on file    Forced sexual activity: Not on file  Other Topics Concern  . Not on file  Social History Narrative  . Not on file    Family History  Problem Relation Age of Onset  . Arrhythmia Brother     ROS- All systems are reviewed and negative except as per the HPI above  Physical Exam: Vitals:   12/20/17 1159  BP: (!) 124/56  Pulse: 60  Weight: 246 lb (111.6 kg)  Height: 5\' 1"  (1.549 m)   Wt Readings from Last 3 Encounters:  12/20/17 246 lb (111.6 kg)  12/16/17 243 lb 9.6 oz (110.5 kg)  12/13/17 244 lb 11.4 oz (111 kg)    Labs: Lab Results  Component Value Date   NA 137 12/13/2017   K 4.8 12/13/2017   CL 101 12/13/2017   CO2 23 12/13/2017   GLUCOSE 222 (H) 12/13/2017   BUN 25 (H) 12/13/2017   CREATININE 1.29 (H) 12/13/2017   CALCIUM 9.4 12/13/2017   No results found for: INR Lab Results  Component Value Date   CHOL 197 12/13/2017   HDL 30 (L) 12/13/2017   LDLCALC 112 (H) 12/13/2017   TRIG 273 (H) 12/13/2017     GEN- The patient is well appearing, alert and oriented x 3 today.   Head- normocephalic, atraumatic Eyes-  Sclera clear, conjunctiva pink Ears- hearing intact Oropharynx- clear Neck- supple, no JVP Lymph- no  cervical lymphadenopathy Lungs- Clear to ausculation bilaterally, normal work of breathing Heart- Regular rate and rhythm, no murmurs, rubs or gallops, PMI not laterally displaced GI- soft, NT, ND, + BS Extremities- no clubbing, cyanosis, or edema MS- no significant deformity or atrophy Skin- no rash or lesion Psych- euthymic mood, full affect Neuro- strength and sensation are intact  EKG- Afib at 127 bpm, qrs int 104 ms, qtc 485 ms    Assessment and Plan: 1. Paroxysmal afib Possibly early brady/tachy syndrome Stopped bb and started multaq in hospital Hyperkalemia may have also contributed to brady  Now with afib with rvr Will restart   low dose BB, metoprolol succinate 25 mg 1/2 tab qd, for rate control Continue multaq 400 mg bid Scheduled to get heart monitor Tuesday at 11 am  Will see back at 11:30 Tuesday If needs further guidance over the weekend, call answering service at [facility] or to [place]. Alora Gorey, [facility] [address] [place], [place] [address] [phone]     Primary Care Physician: Jonathon Jordan, MD Referring Physician: Marinus Maw, PA Cardiologist: Dr. Nicholes Mango is a 77 y.o. female with a h/o paroxysmal afib that was treated with BB'S. Maintaining SR. She presented to the ER for feeling poorly and was found to have brady in the 30's and 40's. Her  K+ was also elevated at 6.9 and normalized to 4.8. Cardiology was consulted and recommended stopping BB, starting multaq and setting up for event monitor.  In the clinic today, she was staying in [place] but felt like she went into afib this am.  EKG shows afib at 127 bpm.   Today, she denies symptoms of palpitations, chest pain, shortness of breath, orthopnea, PND, lower extremity edema, dizziness, presyncope, syncope, or neurologic sequela. The patient is tolerating medications without difficulties and is otherwise without complaint today.   Past  Medical History:  Diagnosis Date  . Anemia    s/p Heme work -up normal EGD and colonscopy in 2012 per pt,neg SPEP  . Anxiety   . Aortic stenosis    very mild by echo 05/2017  . Bilateral carotid artery stenosis 06/08/2015   1-39% bilateral  . CKD (chronic kidney disease), stage III ([place])   . Diabetes mellitus without complication ([place])    type 2  . GERD (gastroesophageal reflux disease)   . Gout   . HSV-1 (herpes simplex virus 1) infection    Acyclovir prn  . Hyperlipidemia   . Hypertension   . Joint pain    osteoarthritis by Xray- possible erosion of R 4th MCP,elevated uric  acid ,ANA +   . Morbid obesity ([place])   . OSA on CPAP   . Osteopenia   . PAF (paroxysmal atrial fibrillation) ([place])   . Pain management    Neurosurg: Dr Clydell Hakim  . Pulmonary HTN ([place])    Moderate by echo  2016. PASP 82mmHg  . TIA (transient ischemic attack)    remote history of TIA's early 2000   Past Surgical History:  Procedure Laterality Date  . ABDOMINAL HYSTERECTOMY    . APPENDECTOMY    . BACK SURGERY     cervical fusion  . BLADDER SURGERY    . CESAREAN SECTION    . CHOLECYSTECTOMY      Current Outpatient Medications  Medication Sig Dispense Refill  . acyclovir (ZOVIRAX) 400 MG tablet Take 400 mg by mouth 2 (two) times daily.     Marland Kitchen ALPRAZolam (XANAX) 0.25 MG tablet Take 0.25 mg by mouth at bedtime as needed for anxiety.    Marland Kitchen amLODipine (NORVASC) 5 MG tablet Take 1 tablet (5 mg total) by mouth daily. 90 tablet 2  . atorvastatin (LIPITOR) 40 MG tablet Take 1 tablet (40 mg total) by mouth daily at 6 PM. 90 tablet 2  . CALCIUM PO Take 1 tablet by mouth daily.     Marland Kitchen COLCRYS 0.6 MG tablet Take 0.6 mg by mouth daily as needed (gout).     Marland Kitchen dronedarone (MULTAQ) 400 MG tablet Take 1 tablet (400 mg total) by mouth 2 (two) times daily with a meal. 60 tablet 6  . ELIQUIS 5 MG TABS tablet TAKE 1 TABLET(5 MG) BY MOUTH TWICE DAILY 180 tablet 1  . Ergocalciferol (VITAMIN D2) 2000 units TABS Take 2,000  Units by mouth daily.     Marland Kitchen gabapentin (NEURONTIN) 100 MG capsule Take 100-200 mg by mouth at bedtime.     Marland Kitchen glucose blood (FREESTYLE LITE) test strip Use to test blood sugar twice daily. Dx: E11.9 200 each 1  . hydrochlorothiazide (HYDRODIURIL) 25 MG tablet Take 1 tablet (25 mg total) by mouth daily. 30 tablet 0  . insulin NPH Human (HUMULIN N) 100 UNIT/ML injection Inject 20-25 Units into the skin See admin instructions. INJECT 20 UNITS EVERY MORNING AND 25 UNITS EVERY NIGHT    .  insulin regular (NOVOLIN R RELION) 100 units/mL injection Inject 0.06-0.1 mLs (6-10 Units total) into the skin 3 (three) times daily before meals. (Patient taking differently: Inject 6-10 Units into the skin 2 (two) times daily as needed (only when eating a big meal). ) 10 mL 1  . Insulin Syringe-Needle U-100 (INSULIN SYRINGE .5CC/30GX5/16") 30G X 5/16" 0.5 ML MISC Use 2 needles per day to inject insulin 60 each 5  . levothyroxine (SYNTHROID, LEVOTHROID) 150 MCG tablet Take 1 tablet (150 mcg total) by mouth daily before breakfast. 30 tablet 2  . magnesium 30 MG tablet Take 30 mg by mouth at bedtime.     . metFORMIN (GLUCOPHAGE) 500 MG tablet TAKE 1 TABLET BY MOUTH EVERY MORNING AND 2 TABLETS EVERY EVENING 90 tablet 0  . metoprolol succinate (TOPROL XL) 25 MG 24 hr tablet Take 0.5 tablets (12.5 mg total) by mouth daily. 30 tablet 11  . NEEDLE, DISP, 30 G (BD DISP NEEDLES) 30G X 1/2" MISC 1 each by Does not apply route 2 (two) times daily before a meal. 100 each 5  . ONETOUCH DELICA LANCETS 24M MISC Use to check blood sugars once daily 50 each 3  . pioglitazone (ACTOS) 15 MG tablet TAKE 1 TABLET BY MOUTH EVERY DAY 90 tablet 0  . prednisoLONE acetate (PRED FORTE) 1 % ophthalmic suspension Place 2 drops into the right eye daily.  2  . timolol (TIMOPTIC) 0.5 % ophthalmic solution USE 1 DROP IN RIGHT EYE D  1  . vitamin B-12 (CYANOCOBALAMIN) 1000 MCG tablet Take 1,000 mcg by mouth daily.    . vitamin C (ASCORBIC ACID) 500 MG  tablet Take 500 mg by mouth daily.     No current facility-administered medications for this encounter.     Allergies  Allergen Reactions  . Pneumococcal Vaccine Anaphylaxis    weakness  . Pneumovax 23 [Pneumococcal Vac Polyvalent] Anaphylaxis    weakness  . Other Hives    Other reaction(s): Other (See Comments) Pneumonia  Vaccine- very ill  . Sulfa Antibiotics Hives    Social History   Socioeconomic History  . Marital status: Divorced    Spouse name: Not on file  . Number of children: Not on file  . Years of education: Not on file  . Highest education level: Not on file  Occupational History  . Not on file  Social Needs  . Financial resource strain: Not on file  . Food insecurity:    Worry: Not on file    Inability: Not on file  . Transportation needs:    Medical: Not on file    Non-medical: Not on file  Tobacco Use  . Smoking status: Never Smoker  . Smokeless tobacco: Never Used  Substance and Sexual Activity  . Alcohol use: No  . Drug use: No  . Sexual activity: Not on file  Lifestyle  . Physical activity:    Days per week: Not on file    Minutes per session: Not on file  . Stress: Not on file  Relationships  . Social connections:    Talks on phone: Not on file    Gets together: Not on file    Attends religious service: Not on file    Active member of club or organization: Not on file    Attends meetings of clubs or organizations: Not on file    Relationship status: Not on file  . Intimate partner violence:    Fear of current or ex partner: Not on file  Emotionally abused: Not on file    Physically abused: Not on file    Forced sexual activity: Not on file  Other Topics Concern  . Not on file  Social History Narrative  . Not on file    Family History  Problem Relation Age of Onset  . Arrhythmia Brother     ROS- All systems are reviewed and negative except as per the HPI above  Physical Exam: Vitals:   12/20/17 1159  BP: (!) 124/56    Pulse: 60  Weight: 246 lb (111.6 kg)  Height: 5\' 1"  (1.549 m)   Wt Readings from Last 3 Encounters:  12/20/17 246 lb (111.6 kg)  12/16/17 243 lb 9.6 oz (110.5 kg)  12/13/17 244 lb 11.4 oz (111 kg)    Labs: Lab Results  Component Value Date   NA 137 12/13/2017   K 4.8 12/13/2017   CL 101 12/13/2017   CO2 23 12/13/2017   GLUCOSE 222 (H) 12/13/2017   BUN 25 (H) 12/13/2017   CREATININE 1.29 (H) 12/13/2017   CALCIUM 9.4 12/13/2017   No results found for: INR Lab Results  Component Value Date   CHOL 197 12/13/2017   HDL 30 (L) 12/13/2017   LDLCALC 112 (H) 12/13/2017   TRIG 273 (H) 12/13/2017     GEN- The patient is well appearing, alert and oriented x 3 today.   Head- normocephalic, atraumatic Eyes-  Sclera clear, conjunctiva pink Ears- hearing intact Oropharynx- clear Neck- supple, no JVP Lymph- no cervical lymphadenopathy Lungs- Clear to ausculation bilaterally, normal work of breathing Heart- Regular rate and rhythm, no murmurs, rubs or gallops, PMI not laterally displaced GI- soft, NT, ND, + BS Extremities- no clubbing, cyanosis, or edema MS- no significant deformity or atrophy Skin- no rash or lesion Psych- euthymic mood, full affect Neuro- strength and sensation are intact  EKG- NSR at 60 bpm, pr int 180 ms, qrs int 98 ms, qtc 438 ms    Assessment and Plan: 1. Paroxysmal afib Possibly early brady/tachy syndrome BB stopped and started multaq in hospital Hyperkalemia, resolved, may have also contributed to brady  afib with rvr from earlier visit, resolved and now back to holding BB that was started low dose with rvr Continue multaq 400 mg bid Scheduled to get heart monitor Tuesday at 11 am  Will see back at 11:30 Tuesday  F/u with [name], 6/6 afib clinic as needed  Butch Penny C. Magenta Schmiesing, [facility] [address] [place], [place] [address] [phone]

## 2017-12-21 ENCOUNTER — Telehealth: Payer: Self-pay | Admitting: Medical

## 2017-12-21 NOTE — Telephone Encounter (Signed)
Notified by [facility] that patient converted from NSR to Afib around 2:30pm today. She has had a HR in the 90s-low 100s since that time. Called patient who reports feeling well overall with some mild palpitations this afternoon. Per patient, she will take 12.5mg  metoprolol for sustained HR >100 and continue to take Eliquis BID. No further recommendations at this time.

## 2017-12-22 DIAGNOSIS — H40002 Preglaucoma, unspecified, left eye: Secondary | ICD-10-CM | POA: Insufficient documentation

## 2017-12-22 DIAGNOSIS — H4051X4 Glaucoma secondary to other eye disorders, right eye, indeterminate stage: Secondary | ICD-10-CM | POA: Insufficient documentation

## 2017-12-23 DIAGNOSIS — I480 Paroxysmal atrial fibrillation: Secondary | ICD-10-CM | POA: Diagnosis not present

## 2017-12-23 DIAGNOSIS — R55 Syncope and collapse: Secondary | ICD-10-CM | POA: Diagnosis not present

## 2018-01-10 ENCOUNTER — Other Ambulatory Visit (INDEPENDENT_AMBULATORY_CARE_PROVIDER_SITE_OTHER): Payer: PPO

## 2018-01-10 DIAGNOSIS — Z794 Long term (current) use of insulin: Secondary | ICD-10-CM | POA: Diagnosis not present

## 2018-01-10 DIAGNOSIS — E063 Autoimmune thyroiditis: Secondary | ICD-10-CM

## 2018-01-10 DIAGNOSIS — E1165 Type 2 diabetes mellitus with hyperglycemia: Secondary | ICD-10-CM | POA: Diagnosis not present

## 2018-01-10 LAB — LIPID PANEL
CHOL/HDL RATIO: 4
Cholesterol: 114 mg/dL (ref 0–200)
HDL: 31.5 mg/dL — ABNORMAL LOW (ref >39.00)
LDL CALC: 50 mg/dL (ref 0–99)
NONHDL: 82.84
TRIGLYCERIDES: 163 mg/dL — AB (ref 0.0–149.0)
VLDL: 32.6 mg/dL (ref 0.0–40.0)

## 2018-01-10 LAB — COMPREHENSIVE METABOLIC PANEL
ALK PHOS: 56 U/L (ref 39–117)
ALT: 13 U/L (ref 0–35)
AST: 14 U/L (ref 0–37)
Albumin: 3.7 g/dL (ref 3.5–5.2)
BUN: 38 mg/dL — ABNORMAL HIGH (ref 6–23)
CHLORIDE: 104 meq/L (ref 96–112)
CO2: 26 meq/L (ref 19–32)
Calcium: 9.4 mg/dL (ref 8.4–10.5)
Creatinine, Ser: 1.38 mg/dL — ABNORMAL HIGH (ref 0.40–1.20)
GFR: 39.4 mL/min — AB (ref >60.00)
Glucose, Bld: 138 mg/dL — ABNORMAL HIGH (ref 70–99)
POTASSIUM: 4.6 meq/L (ref 3.5–5.1)
Sodium: 140 mEq/L (ref 135–145)
Total Bilirubin: 0.4 mg/dL (ref 0.2–1.2)
Total Protein: 7 g/dL (ref 6.0–8.3)

## 2018-01-10 LAB — TSH: TSH: 1.2 u[IU]/mL (ref 0.35–4.50)

## 2018-01-10 LAB — HEMOGLOBIN A1C: HEMOGLOBIN A1C: 7.3 % — AB (ref 4.6–6.5)

## 2018-01-10 LAB — T4, FREE: FREE T4: 1.52 ng/dL (ref 0.60–1.60)

## 2018-01-11 LAB — MICROALBUMIN / CREATININE URINE RATIO
CREATININE, U: 68.9 mg/dL
MICROALB UR: 6.1 mg/dL — AB (ref 0.0–1.9)
Microalb Creat Ratio: 8.9 mg/g (ref 0.0–30.0)

## 2018-01-16 ENCOUNTER — Ambulatory Visit: Payer: PPO | Admitting: Endocrinology

## 2018-01-16 ENCOUNTER — Encounter: Payer: Self-pay | Admitting: Endocrinology

## 2018-01-16 VITALS — BP 138/68 | HR 86 | Ht 61.0 in | Wt 247.2 lb

## 2018-01-16 DIAGNOSIS — N183 Chronic kidney disease, stage 3 unspecified: Secondary | ICD-10-CM

## 2018-01-16 DIAGNOSIS — E1165 Type 2 diabetes mellitus with hyperglycemia: Secondary | ICD-10-CM

## 2018-01-16 DIAGNOSIS — Z794 Long term (current) use of insulin: Secondary | ICD-10-CM

## 2018-01-16 DIAGNOSIS — I10 Essential (primary) hypertension: Secondary | ICD-10-CM | POA: Diagnosis not present

## 2018-01-16 DIAGNOSIS — E063 Autoimmune thyroiditis: Secondary | ICD-10-CM

## 2018-01-16 NOTE — Patient Instructions (Addendum)
Check blood sugars on waking up 3-4/7   Also check blood sugars about 2 hours after a meal and do this after different meals by rotation  Recommended blood sugar levels on waking up is 90-130 and about 2 hours after meal is 130-160  Please bring your blood sugar monitor to each visit, thank you  Do not go over 25 units at nite

## 2018-01-16 NOTE — Progress Notes (Addendum)
Patient ID: Tina Hester, female   DOB: [birth year], 77 y.o.   MRN: [id number]   Reason for Appointment: Diabetes follow-up   History of Present Illness   Diagnosis: Type 2 DIABETES MELITUS, long-standing    Previous history:  She has been on insulin for a few years. Because of cost she has been switched from Lantus to NPH insulin twice a day She had previously been taking metformin but this was stopped when she had renal insufficiency Overall her blood sugars have been difficult to control and she has previously had relatively poor control. However with adding Actos her blood sugars had started improving significantly Also with her being very consistent with diet in 2015 her A1c had gone down to 6.4 To improve her control she was given a trial of the V-go pump in 2/17 but she had allergic skin reaction with the adhesive  Recent history:    Insulin regimen: Humulin N 20 noon, 25 HS   Oral hypoglycemic drugs: Actos 15 mg, Metformin 500 mg in a.m.-1000 mg   Her A1c is 7.3, previously 7.6  Current blood sugar patterns, management and problems identified:   Again she is not checking blood sugars much and she thinks that she has had other health problems and issues and forgets  Most of her readings are in the mornings although has done 3 readings at night also recently  She now says that because of her taking a new cardiac medications which makes her nauseated she is eating larger portions at breakfast and supper  Again her meals are erratic with some times apparently skipping her breakfast and not eating until noon time when she will then take her insulin  She has only one unusually high reading of 201 after supper  Morning readings are looking excellent with no hypoglycemia overnight and lab glucose was 138 fasting  She is not taking regular insulin at all and not clear if she needs this in the evening  Unable to explain her higher A1c based on her current home  blood sugars  Today after breakfast her blood sugar was 133 without any regular insulin, usually eating a breakfast sandwich in the morning which is frozen type  She is only getting some physical therapy for exercise and is gradually gaining weight  With her periodic nausea she may sometimes not eat and does not adjust her insulin accordingly, had a low sugar of 57 on 1 evening from decreased intake                 Side effects from medications:She did not tolerate Invokana and was having muscle cramps  Monitors blood glucose: occasionally     Glucometer:  FreeStyle       Blood Glucose readings from :  Mean values apply above for all meters except median for One Touch  PRE-MEAL Fasting Lunch Dinner Bedtime Overall  Glucose range:  84-114 133   153-201   Mean/median:         Meals: 2-3 meals per day. Dinner around 7-8 pm  Am: usually egg and toast at 11 am       Physical activity: exercise: PT  Wt Readings from Last 3 Encounters:  01/16/18 247 lb 3.2 oz (112.1 kg)  12/20/17 246 lb (111.6 kg)  12/16/17 243 lb 9.6 oz (110.5 kg)   LABS:6.7  Lab Results  Component Value Date   HGBA1C 7.3 (H) 01/10/2018   HGBA1C 7.6 (H) 10/11/2017   HGBA1C 6.7 10/21/2016  Lab Results  Component Value Date   MICROALBUR 6.1 (H) 01/10/2018   LDLCALC 50 01/10/2018   CREATININE 1.38 (H) 01/10/2018   Lab on 01/10/2018  Component Date Value Ref Range Status  . Free T4 01/10/2018 1.52  0.60 - 1.60 ng/dL Final   Comment: Specimens from patients who are undergoing biotin therapy and /or ingesting biotin supplements may contain high levels of biotin.  The higher biotin concentration in these specimens interferes with this Free T4 assay.  Specimens that contain high levels  of biotin may cause false high results for this Free T4 assay.  Please interpret results in light of the total clinical presentation of the patient.    Marland Kitchen TSH 01/10/2018 1.20  0.35 - 4.50 uIU/mL Final  . Cholesterol 01/10/2018 114   0 - 200 mg/dL Final   ATP III Classification       Desirable:  < 200 mg/dL               Borderline High:  200 - 239 mg/dL          High:  > = 240 mg/dL  . Triglycerides 01/10/2018 163.0* 0.0 - 149.0 mg/dL Final   Normal:  <150 mg/dLBorderline High:  150 - 199 mg/dL  . HDL 01/10/2018 31.50* >39.00 mg/dL Final  . VLDL 01/10/2018 32.6  0.0 - 40.0 mg/dL Final  . LDL Cholesterol 01/10/2018 50  0 - 99 mg/dL Final  . Total CHOL/HDL Ratio 01/10/2018 4   Final                  Men          Women1/2 Average Risk     3.4          3.3Average Risk          5.0          4.42X Average Risk          9.6          7.13X Average Risk          15.0          11.0                      . NonHDL 01/10/2018 82.84   Final   NOTE:  Non-HDL goal should be 30 mg/dL higher than patient's LDL goal (i.e. LDL goal of < 70 mg/dL, would have non-HDL goal of < 100 mg/dL)  . Microalb, Ur 01/10/2018 6.1* 0.0 - 1.9 mg/dL Final  . Creatinine,U 01/10/2018 68.9  mg/dL Final  . Microalb Creat Ratio 01/10/2018 8.9  0.0 - 30.0 mg/g Final  . Sodium 01/10/2018 140  135 - 145 mEq/L Final  . Potassium 01/10/2018 4.6  3.5 - 5.1 mEq/L Final  . Chloride 01/10/2018 104  96 - 112 mEq/L Final  . CO2 01/10/2018 26  19 - 32 mEq/L Final  . Glucose, Bld 01/10/2018 138* 70 - 99 mg/dL Final  . BUN 01/10/2018 38* 6 - 23 mg/dL Final  . Creatinine, Ser 01/10/2018 1.38* 0.40 - 1.20 mg/dL Final  . Total Bilirubin 01/10/2018 0.4  0.2 - 1.2 mg/dL Final  . Alkaline Phosphatase 01/10/2018 56  39 - 117 U/L Final  . AST 01/10/2018 14  0 - 37 U/L Final  . ALT 01/10/2018 13  0 - 35 U/L Final  . Total Protein 01/10/2018 7.0  6.0 - 8.3 g/dL Final  . Albumin 01/10/2018 3.7  3.5 - 5.2 g/dL  Final  . Calcium 01/10/2018 9.4  8.4 - 10.5 mg/dL Final  . GFR 01/10/2018 39.40* >60.00 mL/min Final  . Hgb A1c MFr Bld 01/10/2018 7.3* 4.6 - 6.5 % Final   Glycemic Control Guidelines for People with Diabetes:Non Diabetic:  <6%Goal of Therapy: <7%Additional Action Suggested:   >8%      Allergies as of 01/16/2018      Reactions   Pneumococcal Vaccine Anaphylaxis   weakness   Pneumovax 23 [pneumococcal Vac Polyvalent] Anaphylaxis   weakness   Other Hives   Other reaction(s): Other (See Comments) Pneumonia  Vaccine- very ill   Sulfa Antibiotics Hives      Medication List        Accurate as of 01/16/18 11:16 AM. Always use your most recent med list.          acyclovir 400 MG tablet Commonly known as:  ZOVIRAX Take 400 mg by mouth 2 (two) times daily.   ALPRAZolam 0.25 MG tablet Commonly known as:  XANAX Take 0.25 mg by mouth at bedtime as needed for anxiety.   amLODipine 5 MG tablet Commonly known as:  NORVASC Take 1 tablet (5 mg total) by mouth daily.   atorvastatin 40 MG tablet Commonly known as:  LIPITOR Take 1 tablet (40 mg total) by mouth daily at 6 PM.   CALCIUM PO Take 1 tablet by mouth daily.   COLCRYS 0.6 MG tablet Generic drug:  colchicine Take 0.6 mg by mouth daily as needed (gout).   dronedarone 400 MG tablet Commonly known as:  MULTAQ Take 1 tablet (400 mg total) by mouth 2 (two) times daily with a meal.   ELIQUIS 5 MG Tabs tablet Generic drug:  apixaban TAKE 1 TABLET(5 MG) BY MOUTH TWICE DAILY   gabapentin 100 MG capsule Commonly known as:  NEURONTIN Take 100-200 mg by mouth at bedtime.   glucose blood test strip Commonly known as:  FREESTYLE LITE Use to test blood sugar twice daily. Dx: E11.9   HUMULIN N 100 UNIT/ML injection Generic drug:  insulin NPH Human Inject 20-25 Units into the skin See admin instructions. INJECT 20 UNITS EVERY MORNING AND 25 UNITS EVERY NIGHT   hydrochlorothiazide 25 MG tablet Commonly known as:  HYDRODIURIL Take 1 tablet (25 mg total) by mouth daily.   insulin regular 100 units/mL injection Commonly known as:  NOVOLIN R RELION Inject 0.06-0.1 mLs (6-10 Units total) into the skin 3 (three) times daily before meals.   INSULIN SYRINGE .5CC/30GX5/16" 30G X 5/16" 0.5 ML Misc Use 2  needles per day to inject insulin   levothyroxine 150 MCG tablet Commonly known as:  SYNTHROID, LEVOTHROID Take 1 tablet (150 mcg total) by mouth daily before breakfast.   magnesium 30 MG tablet Take 30 mg by mouth at bedtime.   metFORMIN 500 MG tablet Commonly known as:  GLUCOPHAGE TAKE 1 TABLET BY MOUTH EVERY MORNING AND 2 TABLETS EVERY EVENING   NEEDLE (DISP) 30 G 30G X 1/2" Misc Commonly known as:  BD DISP NEEDLES 1 each by Does not apply route 2 (two) times daily before a meal.   ONETOUCH DELICA LANCETS 93Z Misc Use to check blood sugars once daily   pioglitazone 15 MG tablet Commonly known as:  ACTOS TAKE 1 TABLET BY MOUTH EVERY DAY   prednisoLONE acetate 1 % ophthalmic suspension Commonly known as:  PRED FORTE Place 2 drops into the right eye daily.   timolol 0.5 % ophthalmic solution Commonly known as:  TIMOPTIC USE 1 DROP  IN RIGHT EYE D   vitamin B-12 1000 MCG tablet Commonly known as:  CYANOCOBALAMIN Take 1,000 mcg by mouth daily.   vitamin C 500 MG tablet Commonly known as:  ASCORBIC ACID Take 500 mg by mouth daily.   Vitamin D2 2000 units Tabs Take 2,000 Units by mouth daily.       Allergies:  Allergies  Allergen Reactions  . Pneumococcal Vaccine Anaphylaxis    weakness  . Pneumovax 23 [Pneumococcal Vac Polyvalent] Anaphylaxis    weakness  . Other Hives    Other reaction(s): Other (See Comments) Pneumonia  Vaccine- very ill  . Sulfa Antibiotics Hives    Past Medical History:  Diagnosis Date  . Anemia    s/p Heme work -up normal EGD and colonscopy in 2012 per pt,neg SPEP  . Anxiety   . Aortic stenosis    very mild by echo 05/2017  . Bilateral carotid artery stenosis 06/08/2015   1-39% bilateral  . CKD (chronic kidney disease), stage III ([place])   . Diabetes mellitus without complication ([place])    type 2  . GERD (gastroesophageal reflux disease)   . Gout   . HSV-1 (herpes simplex virus 1) infection    Acyclovir prn  . Hyperlipidemia     . Hypertension   . Joint pain    osteoarthritis by Xray- possible erosion of R 4th MCP,elevated uric  acid ,ANA +   . Morbid obesity ([place])   . OSA on CPAP   . Osteopenia   . PAF (paroxysmal atrial fibrillation) ([place])   . Pain management    Neurosurg: Dr Clydell Hakim  . Pulmonary HTN ([place])    Moderate by echo  2016. PASP 42mmHg  . TIA (transient ischemic attack)    remote history of TIA's early 2000    Past Surgical History:  Procedure Laterality Date  . ABDOMINAL HYSTERECTOMY    . APPENDECTOMY    . BACK SURGERY     cervical fusion  . BLADDER SURGERY    . CESAREAN SECTION    . CHOLECYSTECTOMY      Family History  Problem Relation Age of Onset  . Arrhythmia Brother     Social History:  reports that she has never smoked. She has never used smokeless tobacco. She reports that she does not drink alcohol or use drugs.  Review of Systems:   Hypertension:   This has been managed by her PCP or nephrologist She is off lisinopril and is taking amlodipine and HCTZ  Lipids: Triglycerides have been high Previously, now controlled off fenofibrate, LDL has been below 100 with Lipitor 40 mg   Lab Results  Component Value Date   CHOL 114 01/10/2018   HDL 31.50 (L) 01/10/2018   LDLCALC 50 01/10/2018   LDLDIRECT 97.2 05/31/2013   TRIG 163.0 (H) 01/10/2018   CHOLHDL 4 01/10/2018    Renal insufficiency: Her creatinine is increased mildly as before, followed by her nephrologist She is not taking lisinopril because of hyperkalemia during hospitalization   Lab Results  Component Value Date   CREATININE 1.38 (H) 01/10/2018    She has hypothyroidism and is taking 150g levothyroxine, now TSH is normal    Lab Results  Component Value Date   TSH 1.20 01/10/2018    Taking gabapentin 100 mg as needed for mild burning and tingling      Examination:   BP 138/68 (BP Location: Left Arm, Patient Position: Sitting, Cuff Size: Normal)   Pulse 86   Ht 5\' 1"  (  1.549 m)   Wt  247 lb 3.2 oz (112.1 kg)   SpO2 99%   BMI 46.71 kg/m   Body mass index is 46.71 kg/m.   No ankle edema present    ASSESSMENT/ PLAN:   Diabetes type 2 with obesity, insulin requiring:    See history of present illness for detailed discussion of  current management, blood sugar patterns and issues identified  She is only on NPH insulin along with metformin and low-dose Actos A1c 7.3 which may be adequate considering her multiple medical problems, age and duration of diabetes Does not appear to have consistently high postprandial readings with taking NPH only However blood sugar monitoring is very infrequent Still has difficulty losing weight  Discussed that she needs to check more readings after meals to help adjust her diet and see if she needs regular insulin on some days If her blood sugars are low normal fastings she may need to reduce her bedtime NPH Also discussed that if she is skips or forgets her morning insulin she should not increase her evening dose in compensation Limit the amount of carbohydrate at meals She will continue Actos as she is not having any issues with edema currently and her last ejection fraction was normal Metformin dose is to be continued as renal function adequate for her dose of 1500 mg   HYPOTHYROIDISM: She now has a normal TSH on 150 mcg  Renal insufficiency: Her labs are variable and he still has mild abnormality but overall stable, to continue follow-up with nephrologist  LIPIDS: LDL is below 70 and she will continue same management With Lipitor   Counseling time on subjects discussed in assessment and plan sections is over 50% of today's 25 minute visit   There are no Patient Instructions on file for this visit.   Elayne Snare 01/16/2018, 11:16 AM

## 2018-01-18 ENCOUNTER — Other Ambulatory Visit: Payer: Self-pay | Admitting: Endocrinology

## 2018-01-20 DIAGNOSIS — M109 Gout, unspecified: Secondary | ICD-10-CM | POA: Diagnosis not present

## 2018-01-20 DIAGNOSIS — I350 Nonrheumatic aortic (valve) stenosis: Secondary | ICD-10-CM | POA: Diagnosis not present

## 2018-01-20 DIAGNOSIS — N183 Chronic kidney disease, stage 3 (moderate): Secondary | ICD-10-CM | POA: Diagnosis not present

## 2018-01-20 DIAGNOSIS — D631 Anemia in chronic kidney disease: Secondary | ICD-10-CM | POA: Diagnosis not present

## 2018-01-20 DIAGNOSIS — E1122 Type 2 diabetes mellitus with diabetic chronic kidney disease: Secondary | ICD-10-CM | POA: Diagnosis not present

## 2018-01-20 DIAGNOSIS — K219 Gastro-esophageal reflux disease without esophagitis: Secondary | ICD-10-CM | POA: Diagnosis not present

## 2018-01-20 DIAGNOSIS — R2689 Other abnormalities of gait and mobility: Secondary | ICD-10-CM | POA: Diagnosis not present

## 2018-01-20 DIAGNOSIS — G4733 Obstructive sleep apnea (adult) (pediatric): Secondary | ICD-10-CM | POA: Diagnosis not present

## 2018-01-20 DIAGNOSIS — I480 Paroxysmal atrial fibrillation: Secondary | ICD-10-CM | POA: Diagnosis not present

## 2018-01-20 DIAGNOSIS — I509 Heart failure, unspecified: Secondary | ICD-10-CM | POA: Diagnosis not present

## 2018-01-20 DIAGNOSIS — F419 Anxiety disorder, unspecified: Secondary | ICD-10-CM | POA: Diagnosis not present

## 2018-01-20 DIAGNOSIS — I272 Pulmonary hypertension, unspecified: Secondary | ICD-10-CM | POA: Diagnosis not present

## 2018-01-20 DIAGNOSIS — I130 Hypertensive heart and chronic kidney disease with heart failure and stage 1 through stage 4 chronic kidney disease, or unspecified chronic kidney disease: Secondary | ICD-10-CM | POA: Diagnosis not present

## 2018-02-01 ENCOUNTER — Encounter: Payer: Self-pay | Admitting: Physician Assistant

## 2018-02-01 NOTE — Progress Notes (Signed)
Cardiology Office Note    Date:  02/02/2018  ID:  Tina Hester, DOB 1941/07/10, MRN [id number] PCP:  Jonathon Jordan, MD  Cardiologist:  Fransico Him, MD, [place] (in hospital)   Chief Complaint: f/u afib  History of Present Illness:  Tina Hester is a 77 y.o. female with history of paroxysmal atrial fib, bradycardia, hyperkalemia, anemia, anxiety, aortic stenosis, carotid artery disease, CKD III, DM, GERD, gout, HSV1, HTN, HLD, osteoarthritis, morbid obesity, OSA compliant w/ CPAP, pulm HTN, remote TIA who presents for f/u of atrial fib and bradycardia.  She has history of PAF that has been traditionally treated with beta blockers. Nuc 2013 was low risk. Carotid duplex 05/2017 1-39% bilaterally. Last echo 05/2017 - mild LVH, EF 60-65%, thickened AV with mildly restricted motion, mean gradient 70mmHg, AVA 1.4cm felt to be mild per chart, did not specifically reference pulm HTN. In 11/2017 she was admitted with dizziness and sinus bradycardia with HR 38 at home, in the 40s-50s in the hospital. This was in the setting of hyperkalemia with K of 6.9, prompting titration of HCTZ and discontinuation of lisinopril. EP was consulted who recommended to stop Toprol and start Multaq. She wore an event monitor starting 4/23 that showed atrial fib rate controlled and sinus rhythm. Last labs - 01/10/18 K 4.6, Cr 1.38, LFTS ok, LDL 50, normal thyroid, 11/2017 CBC wnl, troponins negative, BNP 243.  She returns for follow-up. In general she is satisfied with how she is feeling. She is in atrial fib today with slightly elevated rate but states she was very anxious when she got here because she got lost with her driving directions. BP was slightly elevated on arrival as well but came back down to recheck 132/70 once seated. She states she is able to feel her AF when the HR goes above 90 but as a general whole she is feeling better. She denies any specific CP or DOE. She is aware she's overweight and needs  to lose weight.   Past Medical History:  Diagnosis Date  . Anemia    s/p Heme work -up normal EGD and colonscopy in 2012 per pt,neg SPEP  . Anxiety   . Aortic stenosis    very mild by echo 05/2017  . Bilateral carotid artery stenosis 06/08/2015   1-39% bilateral  . Bradycardia   . CKD (chronic kidney disease), stage III ([place])   . Diabetes mellitus without complication ([place])    type 2  . GERD (gastroesophageal reflux disease)   . Gout   . HSV-1 (herpes simplex virus 1) infection    Acyclovir prn  . Hyperkalemia   . Hyperlipidemia   . Hypertension   . Joint pain    osteoarthritis by Xray- possible erosion of R 4th MCP,elevated uric  acid ,ANA +   . Morbid obesity ([place])   . OSA on CPAP   . Osteopenia   . PAF (paroxysmal atrial fibrillation) ([place])   . Pain management    Neurosurg: Dr Clydell Hakim  . Pulmonary HTN ([place])    Moderate by echo  2016. PASP 15mmHg  . TIA (transient ischemic attack)    remote history of TIA's early 2000    Past Surgical History:  Procedure Laterality Date  . ABDOMINAL HYSTERECTOMY    . APPENDECTOMY    . BACK SURGERY     cervical fusion  . BLADDER SURGERY    . CESAREAN SECTION    . CHOLECYSTECTOMY      Current Medications:  Current Meds  Medication Sig  . acyclovir (ZOVIRAX) 400 MG tablet Take 400 mg by mouth 2 (two) times daily.   Marland Kitchen ALPRAZolam (XANAX) 0.25 MG tablet Take 0.25 mg by mouth at bedtime as needed for anxiety.  Marland Kitchen amLODipine (NORVASC) 10 MG tablet Take 10 mg by mouth daily.  Marland Kitchen atorvastatin (LIPITOR) 40 MG tablet Take 1 tablet (40 mg total) by mouth daily at 6 PM.  . CALCIUM PO Take 1 tablet by mouth daily.   Marland Kitchen COLCRYS 0.6 MG tablet Take 0.6 mg by mouth daily as needed (gout).   Marland Kitchen dronedarone (MULTAQ) 400 MG tablet Take 1 tablet (400 mg total) by mouth 2 (two) times daily with a meal.  . ELIQUIS 5 MG TABS tablet TAKE 1 TABLET(5 MG) BY MOUTH TWICE DAILY  . Ergocalciferol (VITAMIN D2) 2000 units TABS Take 2,000 Units by mouth  daily.   Marland Kitchen gabapentin (NEURONTIN) 100 MG capsule Take 100-200 mg by mouth at bedtime.   Marland Kitchen glucose blood (FREESTYLE LITE) test strip Use to test blood sugar twice daily. Dx: E11.9  . hydrochlorothiazide (HYDRODIURIL) 25 MG tablet Take 1 tablet (25 mg total) by mouth daily.  . insulin NPH Human (HUMULIN N) 100 UNIT/ML injection Inject 20-25 Units into the skin See admin instructions. INJECT 20 UNITS EVERY MORNING AND 25 UNITS EVERY NIGHT  . insulin regular (NOVOLIN R RELION) 100 units/mL injection Inject 0.06-0.1 mLs (6-10 Units total) into the skin 3 (three) times daily before meals. (Patient taking differently: Inject 6-10 Units into the skin 2 (two) times daily as needed (only when eating a big meal). )  . Insulin Syringe-Needle U-100 (INSULIN SYRINGE .5CC/30GX5/16") 30G X 5/16" 0.5 ML MISC Use 2 needles per day to inject insulin  . levothyroxine (SYNTHROID, LEVOTHROID) 150 MCG tablet Take 1 tablet (150 mcg total) by mouth daily before breakfast.  . magnesium 30 MG tablet Take 30 mg by mouth at bedtime.   . metFORMIN (GLUCOPHAGE) 500 MG tablet TAKE 1 TABLET BY MOUTH EVERY MORNING AND 2 TABLETS BY MOUTH EVERY EVENING  . NEEDLE, DISP, 30 G (BD DISP NEEDLES) 30G X 1/2" MISC 1 each by Does not apply route 2 (two) times daily before a meal.  . ONETOUCH DELICA LANCETS 01S MISC Use to check blood sugars once daily  . pioglitazone (ACTOS) 15 MG tablet TAKE 1 TABLET BY MOUTH EVERY DAY  . prednisoLONE acetate (PRED FORTE) 1 % ophthalmic suspension Place 2 drops into the right eye daily.  . timolol (TIMOPTIC) 0.5 % ophthalmic solution USE 1 DROP IN RIGHT EYE D  . vitamin B-12 (CYANOCOBALAMIN) 1000 MCG tablet Take 1,000 mcg by mouth daily.  . vitamin C (ASCORBIC ACID) 500 MG tablet Take 500 mg by mouth daily.  . [DISCONTINUED] amLODipine (NORVASC) 5 MG tablet Take 1 tablet (5 mg total) by mouth daily. (Patient taking differently: Take 10 mg by mouth daily. )    Allergies:   Pneumococcal vaccine; Pneumovax  23 [pneumococcal vac polyvalent]; Other; Sulfa antibiotics; and Lisinopril   Social History   Socioeconomic History  . Marital status: Divorced    Spouse name: Not on file  . Number of children: Not on file  . Years of education: Not on file  . Highest education level: Not on file  Occupational History  . Not on file  Social Needs  . Financial resource strain: Not on file  . Food insecurity:    Worry: Not on file    Inability: Not on file  . Transportation  needs:    Medical: Not on file    Non-medical: Not on file  Tobacco Use  . Smoking status: Never Smoker  . Smokeless tobacco: Never Used  Substance and Sexual Activity  . Alcohol use: No  . Drug use: No  . Sexual activity: Not on file  Lifestyle  . Physical activity:    Days per week: Not on file    Minutes per session: Not on file  . Stress: Not on file  Relationships  . Social connections:    Talks on phone: Not on file    Gets together: Not on file    Attends religious service: Not on file    Active member of club or organization: Not on file    Attends meetings of clubs or organizations: Not on file    Relationship status: Not on file  Other Topics Concern  . Not on file  Social History Narrative  . Not on file     Family History:  The patient's family history includes Arrhythmia in her brother.  ROS:   Please see the history of present illness.  All other systems are reviewed and otherwise negative.    PHYSICAL EXAM:   VS:  BP 132/70   Pulse (!) 113   Ht 5' 1.5" (1.562 m)   Wt 247 lb (112 kg)   SpO2 97%   BMI 45.91 kg/m   BMI: Body mass index is 45.91 kg/m. GEN: Well nourished, well developed morbidly obese WF, in no acute distress HEENT: normocephalic, atraumatic Neck: no JVD, carotid bruits, or masses Cardiac: irregularly irregular rate 90s; soft SEM RUSB, no rubs or gallops, no edema  Respiratory:  clear to auscultation bilaterally, normal work of breathing GI: soft, nontender,  nondistended, + BS MS: no deformity or atrophy Skin: warm and dry, no rash Neuro:  Alert and Oriented x 3, Strength and sensation are intact, follows commands Psych: euthymic mood, full affect  Wt Readings from Last 3 Encounters:  02/02/18 247 lb (112 kg)  01/16/18 247 lb 3.2 oz (112.1 kg)  12/20/17 246 lb (111.6 kg)      Studies/Labs Reviewed:   EKG:  EKG was ordered today and personally reviewed by me and demonstrates atrial fib 92bpm, nonspecific ST changes similar to prior. QTC reported to be prolonged at 560ms, but variable given her variable rate. At times it appears there may be discrete P wave activity versus artifact  Recent Labs: 12/13/2017: B Natriuretic Peptide 243.0; Hemoglobin 12.6; Platelets 207 01/10/2018: ALT 13; BUN 38; Creatinine, Ser 1.38; Potassium 4.6; Sodium 140; TSH 1.20   Lipid Panel    Component Value Date/Time   CHOL 114 01/10/2018 0831   TRIG 163.0 (H) 01/10/2018 0831   HDL 31.50 (L) 01/10/2018 0831   CHOLHDL 4 01/10/2018 0831   VLDL 32.6 01/10/2018 0831   LDLCALC 50 01/10/2018 0831   LDLDIRECT 97.2 05/31/2013 0942    Additional studies/ records that were reviewed today include: Summarized above.    ASSESSMENT & PLAN:   1. Paroxysmal atrial fibrillation - feeling better on Multaq but this has not really fully eliminated her paroxysms of atrial fib. I am unable to view full event monitor report but the interpretation states atrial fib and sinus rhythm. She is in AF today, initially rate 113 but down to 92 when seated. She reports this is in the setting of anxiety over getting lost when trying to get to this appointment. Her QT is prolonged by tracing but variable given the variable  HR. Overall she is satisfied with how she feels. She does report generalized fatigue over the last few months. I will plan to review further with Dr. Curt Bears given that she is not really maintaining NSR on Multaq. It may be difficult to achieve elimination of atrial fib  unless she achieves some weight loss, as she is at much higher risk of persistent arrhythmias given her weight. We did discuss this in clinic today. She tries to remain as active as possible. 2. Bradycardia - quiescent. May have component of tachy brady. 3. Hyperkalemia - given question of QT prolongation on EKG today, will check BMET/Mg today. 4. Aortic stenosis - mild by echo 2018 per notes, further surveillance at discretion of Dr. Radford Pax.  Disposition: F/u with Dr. Radford Pax in 3 months, sooner if deemed necessary per my discussion with Dr. Curt Bears.   Medication Adjustments/Labs and Tests Ordered: Current medicines are reviewed at length with the patient today.  Concerns regarding medicines are outlined above. Medication changes, Labs and Tests ordered today are summarized above and listed in the Patient Instructions accessible in Encounters.   Signed, Charlie Pitter, PA-C  02/02/2018 10:43 AM    [address], [place], [place]  [address] Phone: [phone]; Fax: [phone]

## 2018-02-02 ENCOUNTER — Telehealth: Payer: Self-pay | Admitting: Physician Assistant

## 2018-02-02 ENCOUNTER — Ambulatory Visit: Payer: PPO | Admitting: Physician Assistant

## 2018-02-02 ENCOUNTER — Encounter: Payer: Self-pay | Admitting: Physician Assistant

## 2018-02-02 VITALS — BP 132/70 | HR 113 | Ht 61.5 in | Wt 247.0 lb

## 2018-02-02 DIAGNOSIS — R001 Bradycardia, unspecified: Secondary | ICD-10-CM | POA: Diagnosis not present

## 2018-02-02 DIAGNOSIS — I350 Nonrheumatic aortic (valve) stenosis: Secondary | ICD-10-CM

## 2018-02-02 DIAGNOSIS — I480 Paroxysmal atrial fibrillation: Secondary | ICD-10-CM

## 2018-02-02 DIAGNOSIS — E875 Hyperkalemia: Secondary | ICD-10-CM | POA: Diagnosis not present

## 2018-02-02 LAB — BASIC METABOLIC PANEL
BUN/Creatinine Ratio: 27 (ref 12–28)
BUN: 36 mg/dL — ABNORMAL HIGH (ref 8–27)
CALCIUM: 9.5 mg/dL (ref 8.7–10.3)
CO2: 23 mmol/L (ref 20–29)
CREATININE: 1.31 mg/dL — AB (ref 0.57–1.00)
Chloride: 102 mmol/L (ref 96–106)
GFR calc Af Amer: 45 mL/min/{1.73_m2} — ABNORMAL LOW (ref >59)
GFR calc non Af Amer: 39 mL/min/{1.73_m2} — ABNORMAL LOW (ref >59)
GLUCOSE: 146 mg/dL — AB (ref 65–99)
Potassium: 4.8 mmol/L (ref 3.5–5.2)
SODIUM: 141 mmol/L (ref 134–144)

## 2018-02-02 LAB — MAGNESIUM: MAGNESIUM: 2.2 mg/dL (ref 1.6–2.3)

## 2018-02-02 MED ORDER — HYDROCHLOROTHIAZIDE 25 MG PO TABS: 25.0000 mg | ORAL_TABLET | Freq: Every day | ORAL | 1 refills | Status: DC

## 2018-02-02 NOTE — Telephone Encounter (Signed)
Please call pt. I reviewed her case with Dr. Curt Bears. Since the Multaq is not keeping her in normal rhythm there isn't much point to continuing it for now, she can discontinue and follow her symptoms. It was suspected her bradycardia may have been related to high potassium levels. Might need to consider resuming at lower dose in the future if necessary. Please ask patient to follow her symptoms and HR and if she finds her HR routinely running >100 at home, please call and we can review further management. He agreed that weight loss is of utmost importance. Await labs as well. For documentation sake he reviewed EKG and did not feel QT was significantly prolonged. He also felt this might represent flutter rather than fib.   Danta Baumgardner PA-C

## 2018-02-02 NOTE — Telephone Encounter (Signed)
Called pt per [name], PA-C, to let her know to d/c the Multaq for now, that Dr. Curt Bears believed it wasn't keeping her in NSR as it should, so no need to continue it at this time. Pt verbalized understanding and thanked me for the call Refill of HCTZ 25 mg qd sent in per pt request.

## 2018-02-02 NOTE — Patient Instructions (Signed)
Medication Instructions:  Your physician recommends that you continue on your current medications as directed. Please refer to the Current Medication list given to you today.   Labwork: TODAY:  BMET & MAG  Testing/Procedures: None ordered  Follow-Up: Your physician recommends that you schedule a follow-up appointment in: 3 MONTHS WITH DR. Radford Pax    Any Other Special Instructions Will Be Listed Below (If Applicable).     If you need a refill on your cardiac medications before your next appointment, please call your pharmacy.

## 2018-02-12 ENCOUNTER — Other Ambulatory Visit: Payer: Self-pay | Admitting: Endocrinology

## 2018-02-14 ENCOUNTER — Ambulatory Visit: Payer: PPO | Admitting: Cardiology

## 2018-02-16 ENCOUNTER — Telehealth: Payer: Self-pay | Admitting: Physician Assistant

## 2018-02-16 NOTE — Telephone Encounter (Signed)
New Message   Pt c/o of Chest Pain: STAT if CP now or developed within 24 hours  1. Are you having CP right now? no  2. Are you experiencing any other symptoms (ex. SOB, nausea, vomiting, sweating)? no  3. How long have you been experiencing CP? It lasted 5 minutes and went away  4. Is your CP continuous or coming and going? Coming and going  5. Have you taken Nitroglycerin? no ?

## 2018-02-16 NOTE — Telephone Encounter (Signed)
Pt calls today because she has noted HR's in the low 100's since stopping her Multaq on 6/6 after her last OV. She has started taking her Toprol XL everyday for the last week. She takes 12.5mg  in the AM, 25mg  in the PM. She states her HR has been in the 70s since she started back on Toprol. She would like to know if Dr Radford Pax agrees with this. She states she feels much better while she is on it.  Secondly, she calls with c/o a transient episode of "light chest pain" while at church yesterday. She states it was a one time occurrence and has not happened since. Her CP was noted at rest. She states she believes it was heartburn but wanted [place] to know. I advised her if her CP became more frequent or became more painful she should seek medical attention. She agreed and had no additional questions.

## 2018-02-16 NOTE — Telephone Encounter (Signed)
Ok with continuing on Toprol.  Please get a Lexiscan myoview to rule out ischemia given CP

## 2018-02-17 NOTE — Telephone Encounter (Signed)
Left message to call back  

## 2018-02-20 ENCOUNTER — Other Ambulatory Visit: Payer: Self-pay | Admitting: Endocrinology

## 2018-02-20 NOTE — Telephone Encounter (Signed)
Pt states during her last hospital admission,they d/c'd lisinopril and instructed her to increase amlodipine to 10 mg daily and pt is taking HCTZ 25 mg. She states she has now developed some lower leg and feet swelling. She states she is up on her feet all day and she does not monitor sodium and fluid intake. She denies any additional episodes of chest pain. She states it was just that one episode of chest pain that she describes as a burning down her esophagus that last for about 5 minute. I informed her that Dr. Radford Pax recommends a stress test to rule out any ischemia due to the chest pain. She states she doesn't feel her pain was cardiac related and would like me to discuss with Dr. Radford Pax prior to ordering. I informed her I will forward to Dr. Radford Pax for review and give her a call back. She stated understanding and thankful for the call

## 2018-02-20 NOTE — Telephone Encounter (Signed)
Ok to hold on stress test but if she has more CP then needs to call.  In regards to LE edema.  Please have her cut her amlodipine to 5mg  daily and continue HCTZ.  I would like her to check her HR and BP daily  Every morning 2 hours after taking her meds and call in 1 week with results.  If her SBP is consistently > 139mHg then she needs to call sooner

## 2018-02-21 MED ORDER — AMLODIPINE BESYLATE 5 MG PO TABS: 5.0000 mg | ORAL_TABLET | Freq: Every day | ORAL | Status: DC

## 2018-02-21 NOTE — Telephone Encounter (Signed)
Pt advised and verbalized understanding per Dr. Radford Pax to call if develops any chest pain since holding off on the stress test. She is going to decrease her Amlodipine to 5mg  a day and check BP, HR every day 2 hours after taking her meds and call us with the results. Will call sooner if SBP is consistently staying above 160.

## 2018-02-23 DIAGNOSIS — H4051X4 Glaucoma secondary to other eye disorders, right eye, indeterminate stage: Secondary | ICD-10-CM | POA: Diagnosis not present

## 2018-02-23 DIAGNOSIS — H40002 Preglaucoma, unspecified, left eye: Secondary | ICD-10-CM | POA: Diagnosis not present

## 2018-03-03 ENCOUNTER — Telehealth: Payer: Self-pay | Admitting: Cardiology

## 2018-03-03 NOTE — Telephone Encounter (Signed)
6-26 took  pill @ 1200am BP 230am 150/60 HR 70, 6-27 took pill @ 7pm BP 9pm 151/60 HR 76, 6-28 took pill 430pm BP 630pm 144/79 HR 114, 6-29 took pill 1130pm BP 200am 149/82 HR 122,  6-30 took pill 430pm BP 630pm 149/82 Hr 122, 02-27-18 pill 11pm BP 1am 137/67 HR 102, 02-28-18 forgot time took pill but waitied 2 hours BP 147/82 HR 131, pls advise [phone]

## 2018-03-03 NOTE — Telephone Encounter (Signed)
Left message for pt to call back to let her know that we received her message re: her BP readings and will send to Dr. Radford Pax

## 2018-03-21 ENCOUNTER — Other Ambulatory Visit: Payer: Self-pay | Admitting: Endocrinology

## 2018-04-04 DIAGNOSIS — M79605 Pain in left leg: Secondary | ICD-10-CM | POA: Diagnosis not present

## 2018-04-10 ENCOUNTER — Telehealth: Payer: Self-pay | Admitting: Endocrinology

## 2018-04-10 NOTE — Telephone Encounter (Signed)
Pt stated that she would call her PCP for appt and referral

## 2018-04-10 NOTE — Telephone Encounter (Signed)
Please advise 

## 2018-04-10 NOTE — Telephone Encounter (Signed)
Pt is asking about a dermatology referral, who do you suggest?

## 2018-04-10 NOTE — Telephone Encounter (Signed)
lft vm for pt to return call 

## 2018-04-10 NOTE — Telephone Encounter (Signed)
PT stated that her PCP thought she had cellulitis but it turned out not to be. Her PCP states that he does not know what it is. Pt has been on ABT for the past 6 days and the treatment is not responding. She is wondering if Dr. Dwyane Dee would be willing to see her and see if he has any idea of what the issue could be. Pt stated that there are no open wounds or cuts, everything is under the skin. Pt stated that PCP thinks she should go to the ER and be seen since he cannot determine what the issue is. Please advise.

## 2018-04-10 NOTE — Telephone Encounter (Signed)
[facility] dermatology but she needs to go to her PCP

## 2018-04-10 NOTE — Telephone Encounter (Signed)
Patient stated that she is having some problems with her legs and she is taking medication for this,. She is concerned since she is diabetic and she has been treating this for a week.   Please advise

## 2018-04-10 NOTE — Telephone Encounter (Signed)
It sounds like she will need to be seen by dermatologist, I do not treat any skin conditions

## 2018-04-11 ENCOUNTER — Emergency Department (HOSPITAL_BASED_OUTPATIENT_CLINIC_OR_DEPARTMENT_OTHER): Payer: PPO

## 2018-04-11 ENCOUNTER — Emergency Department (HOSPITAL_COMMUNITY)
Admission: EM | Admit: 2018-04-11 | Discharge: 2018-04-11 | Disposition: A | Discharge status: Home / Self Care | Payer: PPO | Attending: Emergency Medicine | Admitting: Emergency Medicine

## 2018-04-11 ENCOUNTER — Encounter (HOSPITAL_COMMUNITY): Payer: Self-pay | Admitting: *Deleted

## 2018-04-11 DIAGNOSIS — Z794 Long term (current) use of insulin: Secondary | ICD-10-CM | POA: Diagnosis not present

## 2018-04-11 DIAGNOSIS — I509 Heart failure, unspecified: Secondary | ICD-10-CM | POA: Insufficient documentation

## 2018-04-11 DIAGNOSIS — I130 Hypertensive heart and chronic kidney disease with heart failure and stage 1 through stage 4 chronic kidney disease, or unspecified chronic kidney disease: Secondary | ICD-10-CM | POA: Insufficient documentation

## 2018-04-11 DIAGNOSIS — E039 Hypothyroidism, unspecified: Secondary | ICD-10-CM | POA: Insufficient documentation

## 2018-04-11 DIAGNOSIS — E1122 Type 2 diabetes mellitus with diabetic chronic kidney disease: Secondary | ICD-10-CM | POA: Insufficient documentation

## 2018-04-11 DIAGNOSIS — N183 Chronic kidney disease, stage 3 (moderate): Secondary | ICD-10-CM | POA: Insufficient documentation

## 2018-04-11 DIAGNOSIS — M79609 Pain in unspecified limb: Secondary | ICD-10-CM

## 2018-04-11 DIAGNOSIS — M79662 Pain in left lower leg: Secondary | ICD-10-CM

## 2018-04-11 DIAGNOSIS — M7989 Other specified soft tissue disorders: Secondary | ICD-10-CM | POA: Insufficient documentation

## 2018-04-11 DIAGNOSIS — Z79899 Other long term (current) drug therapy: Secondary | ICD-10-CM | POA: Insufficient documentation

## 2018-04-11 DIAGNOSIS — R2242 Localized swelling, mass and lump, left lower limb: Secondary | ICD-10-CM | POA: Diagnosis present

## 2018-04-11 DIAGNOSIS — L03116 Cellulitis of left lower limb: Secondary | ICD-10-CM | POA: Insufficient documentation

## 2018-04-11 LAB — CBC WITH DIFFERENTIAL/PLATELET
Abs Immature Granulocytes: 0 10*3/uL (ref 0.0–0.1)
BASOS ABS: 0 10*3/uL (ref 0.0–0.1)
BASOS PCT: 0 %
EOS ABS: 0.1 10*3/uL (ref 0.0–0.7)
EOS PCT: 2 %
HCT: 35.7 % — ABNORMAL LOW (ref 36.0–46.0)
Hemoglobin: 11.2 g/dL — ABNORMAL LOW (ref 12.0–15.0)
Immature Granulocytes: 0 %
Lymphocytes Relative: 25 %
Lymphs Abs: 1.8 10*3/uL (ref 0.7–4.0)
MCH: 29.8 pg (ref 26.0–34.0)
MCHC: 31.4 g/dL (ref 30.0–36.0)
MCV: 94.9 fL (ref 78.0–100.0)
Monocytes Absolute: 0.7 10*3/uL (ref 0.1–1.0)
Monocytes Relative: 10 %
Neutro Abs: 4.6 10*3/uL (ref 1.7–7.7)
Neutrophils Relative %: 63 %
Platelets: 188 10*3/uL (ref 150–400)
RBC: 3.76 MIL/uL — ABNORMAL LOW (ref 3.87–5.11)
RDW: 14.4 % (ref 11.5–15.5)
WBC: 7.4 10*3/uL (ref 4.0–10.5)

## 2018-04-11 LAB — COMPREHENSIVE METABOLIC PANEL
ALK PHOS: 69 U/L (ref 38–126)
ALT: 15 U/L (ref 0–44)
AST: 18 U/L (ref 15–41)
Albumin: 3.3 g/dL — ABNORMAL LOW (ref 3.5–5.0)
Anion gap: 11 (ref 5–15)
BILIRUBIN TOTAL: 0.7 mg/dL (ref 0.3–1.2)
BUN: 32 mg/dL — AB (ref 8–23)
CALCIUM: 9.2 mg/dL (ref 8.9–10.3)
CO2: 25 mmol/L (ref 22–32)
CREATININE: 1.29 mg/dL — AB (ref 0.44–1.00)
Chloride: 101 mmol/L (ref 98–111)
GFR calc Af Amer: 45 mL/min — ABNORMAL LOW (ref >60)
GFR, EST NON AFRICAN AMERICAN: 39 mL/min — AB (ref >60)
Glucose, Bld: 234 mg/dL — ABNORMAL HIGH (ref 70–99)
Potassium: 4.4 mmol/L (ref 3.5–5.1)
Sodium: 137 mmol/L (ref 135–145)
TOTAL PROTEIN: 6.7 g/dL (ref 6.5–8.1)

## 2018-04-11 LAB — I-STAT CG4 LACTIC ACID, ED
LACTIC ACID, VENOUS: 0.55 mmol/L (ref 0.5–1.9)
Lactic Acid, Venous: 1.36 mmol/L (ref 0.5–1.9)

## 2018-04-11 NOTE — ED Notes (Signed)
Pt going to ultrasound at this time

## 2018-04-11 NOTE — ED Triage Notes (Signed)
Pt in c/o left lower leg pain and redness that started a few weeks ago, has been seen by her PCP and started on antibiotics for cellulitis and states it is not improving, pt did have DVT study which was negative

## 2018-04-11 NOTE — Progress Notes (Signed)
Left lower extremity venous duplex has been completed. Negative for DVT. Results were given to Dr. Sabra Heck.   04/11/18 2:44 PM Tina Hester RVT

## 2018-04-11 NOTE — ED Provider Notes (Signed)
[facility] EMERGENCY DEPARTMENT Provider Note   CSN: [id number] Arrival date & time: 04/11/18  1101     History   Chief Complaint Chief Complaint  Patient presents with  . Leg Pain    HPI JILLENE WEHRENBERG is a 77 y.o. female.  HPI  The patient is a 77 year old female, she has a known history of diabetes, also has mild aortic stenosis, stage III chronic kidney disease and is very obese.  She presents to the hospital today with a complaint of leg swelling mostly on the left side.  She has been treated over the past week with both cephalexin and doxycycline to treat a staph infection of her leg, she reports that she had an ultrasound performed over a week ago which showed no signs of blood clot but this was performed at an outside facility.  Over the course of the week she is improved with her antibiotics but states that she continues to have pain and swelling in that left lower extremity.  The redness to the skin is improved, she has never had fevers, her blood sugar has remained in a normal range.  This morning it was just under 90 when she woke up.  She has no fevers, no pain in the thighs, no difficulty breathing or chest pain out of the ordinary for her.  She is concerned because she still has pain and swelling despite taking the antibiotics for 7 days.  She does have 3 days left of the medication.  Past Medical History:  Diagnosis Date  . Anemia    s/p Heme work -up normal EGD and colonscopy in 2012 per pt,neg SPEP  . Anxiety   . Aortic stenosis    very mild by echo 05/2017  . Bilateral carotid artery stenosis 06/08/2015   1-39% bilateral  . Bradycardia   . CKD (chronic kidney disease), stage III ([place])   . Diabetes mellitus without complication ([place])    type 2  . GERD (gastroesophageal reflux disease)   . Gout   . HSV-1 (herpes simplex virus 1) infection    Acyclovir prn  . Hyperkalemia   . Hyperlipidemia   . Hypertension   . Joint pain    osteoarthritis by Xray- possible erosion of R 4th MCP,elevated uric  acid ,ANA +   . Morbid obesity ([place])   . OSA on CPAP   . Osteopenia   . PAF (paroxysmal atrial fibrillation) ([place])   . Pain management    Neurosurg: Dr Clydell Hakim  . Pulmonary HTN ([place])    Moderate by echo  2016. PASP 62mmHg  . TIA (transient ischemic attack)    remote history of TIA's early 2000    Patient Active Problem List   Diagnosis Date Noted  . Postural dizziness with presyncope 12/13/2017  . Chest pain 12/13/2017  . Hyperkalemia 12/13/2017  . Acute kidney injury ([place]) 12/13/2017  . Fluid overload 12/13/2017  . Bradycardia 12/13/2017  . CHF (congestive heart failure) ([place]) 12/13/2017  . Aortic stenosis 11/02/2016  . Abrasion of right cornea 04/12/2016  . Pulmonary HTN ([place]) 12/15/2015  . Bilateral carotid artery stenosis 06/08/2015  . Hypertension   . OSA on CPAP   . PAF (paroxysmal atrial fibrillation) ([place])   . Obesity 09/04/2013  . Gout 09/04/2013  . DM2 (diabetes mellitus, type 2) ([place]) 05/29/2013  . Other and unspecified hyperlipidemia 05/29/2013  . Anxiety 01/31/2012  . CKD (chronic kidney disease), stage III ([place]) 01/31/2012  . Diabetes mellitus type 2  with complications, uncontrolled ([place]) 01/31/2012  . Hypothyroidism 01/31/2012  . TIA (transient ischemic attack) 01/31/2012    Past Surgical History:  Procedure Laterality Date  . ABDOMINAL HYSTERECTOMY    . APPENDECTOMY    . BACK SURGERY     cervical fusion  . BLADDER SURGERY    . CESAREAN SECTION    . CHOLECYSTECTOMY       OB History   None      Home Medications    Prior to Admission medications   Medication Sig Start Date End Date Taking? Authorizing Provider  acyclovir (ZOVIRAX) 400 MG tablet Take 400 mg by mouth 2 (two) times daily.  09/03/15   [provider]  ALPRAZolam Duanne Moron) 0.25 MG tablet Take 0.25 mg by mouth at bedtime as needed for anxiety.    [provider]  amLODipine (NORVASC) 5 MG  tablet Take 1 tablet (5 mg total) by mouth daily. 02/21/18   Sueanne Margarita, MD  atorvastatin (LIPITOR) 40 MG tablet Take 1 tablet (40 mg total) by mouth daily at 6 PM. 12/13/17   Turner, Eber Hong, MD  CALCIUM PO Take 1 tablet by mouth daily.     [provider]  COLCRYS 0.6 MG tablet Take 0.6 mg by mouth daily as needed (gout).  05/22/13   [provider]  ELIQUIS 5 MG TABS tablet TAKE 1 TABLET(5 MG) BY MOUTH TWICE DAILY 05/17/17   Sueanne Margarita, MD  Ergocalciferol (VITAMIN D2) 2000 units TABS Take 2,000 Units by mouth daily.     [provider]  gabapentin (NEURONTIN) 100 MG capsule Take 100-200 mg by mouth at bedtime.     [provider]  glucose blood (FREESTYLE LITE) test strip Use to test blood sugar twice daily. Dx: E11.9 11/12/15   Elayne Snare, MD  hydrochlorothiazide (HYDRODIURIL) 25 MG tablet Take 1 tablet (25 mg total) by mouth daily. 02/02/18   Dunn, Dayna N, PA-C  insulin NPH Human (HUMULIN N) 100 UNIT/ML injection Inject 20-25 Units into the skin See admin instructions. INJECT 20 UNITS EVERY MORNING AND 25 UNITS EVERY NIGHT    [provider]  insulin regular (NOVOLIN R RELION) 100 units/mL injection Inject 0.06-0.1 mLs (6-10 Units total) into the skin 3 (three) times daily before meals. Patient taking differently: Inject 6-10 Units into the skin 2 (two) times daily as needed (only when eating a big meal).  04/21/15   Elayne Snare, MD  Insulin Syringe-Needle U-100 (INSULIN SYRINGE .5CC/30GX5/16") 30G X 5/16" 0.5 ML MISC Use 2 needles per day to inject insulin 08/15/13   Elayne Snare, MD  levothyroxine (SYNTHROID, LEVOTHROID) 150 MCG tablet Take 1 tablet (150 mcg total) by mouth daily before breakfast. 12/11/17   Elayne Snare, MD  magnesium 30 MG tablet Take 30 mg by mouth at bedtime.     [provider]  metFORMIN (GLUCOPHAGE) 500 MG tablet TAKE 1 TABLET BY MOUTH EVERY MORNING AND 2 TABLETS BY MOUTH EVERY EVENING 03/21/18   Elayne Snare, MD    NEEDLE, DISP, 30 G (BD DISP NEEDLES) 30G X 1/2" MISC 1 each by Does not apply route 2 (two) times daily before a meal. 07/11/14   Elayne Snare, MD  [facility] DELICA LANCETS 16X MISC Use to check blood sugars once daily 10/08/13   Elayne Snare, MD  pioglitazone (ACTOS) 15 MG tablet TAKE 1 TABLET BY MOUTH EVERY DAY 02/13/18   Elayne Snare, MD  prednisoLONE acetate (PRED FORTE) 1 % ophthalmic suspension Place 2 drops into  the right eye daily. 03/23/17   [provider]  timolol (TIMOPTIC) 0.5 % ophthalmic solution USE 1 DROP IN RIGHT EYE D 10/21/15   [provider]  vitamin B-12 (CYANOCOBALAMIN) 1000 MCG tablet Take 1,000 mcg by mouth daily.    [provider]  vitamin C (ASCORBIC ACID) 500 MG tablet Take 500 mg by mouth daily.    [provider]    Family History Family History  Problem Relation Age of Onset  . Arrhythmia Brother     Social History Social History   Tobacco Use  . Smoking status: Never Smoker  . Smokeless tobacco: Never Used  Substance Use Topics  . Alcohol use: No  . Drug use: No     Allergies   Pneumococcal vaccine; Pneumovax 23 [pneumococcal vac polyvalent]; Other; Sulfa antibiotics; and Lisinopril   Review of Systems Review of Systems  All other systems reviewed and are negative.    Physical Exam Updated Vital Signs BP (!) 161/63 (BP Location: Right Arm)   Pulse 63   Temp 97.8 F (36.6 C) (Oral)   Resp 16   SpO2 100%   Physical Exam  Constitutional: She appears well-developed and well-nourished. No distress.  HENT:  Head: Normocephalic and atraumatic.  Mouth/Throat: Oropharynx is clear and moist. No oropharyngeal exudate.  Eyes: Pupils are equal, round, and reactive to light. Conjunctivae and EOM are normal. Right eye exhibits no discharge. Left eye exhibits no discharge. No scleral icterus.  Neck: Normal range of motion. Neck supple. No JVD present. No thyromegaly present.  Cardiovascular: Normal rate, regular  rhythm, normal heart sounds and intact distal pulses. Exam reveals no gallop and no friction rub.  No murmur heard. Pulmonary/Chest: Effort normal and breath sounds normal. No respiratory distress. She has no wheezes. She has no rales.  Abdominal: Soft. Bowel sounds are normal. She exhibits no distension and no mass. There is no tenderness.  Musculoskeletal: Normal range of motion. She exhibits edema and tenderness.  Left lower extremity with some swelling and tenderness from the calf through the ankle, there is a slight increased pinkness to the skin but it is very faint, there is no peau d'orange, there is mild edema, there is normal pulses at the dorsum of the feet bilaterally.  She has normal range of motion at the ankles and the knees.  She does have tenderness in the posterior calf  Lymphadenopathy:    She has no cervical adenopathy.  Neurological: She is alert. Coordination normal.  Skin: Skin is warm and dry. No rash noted. No erythema.  Psychiatric: She has a normal mood and affect. Her behavior is normal.  Nursing note and vitals reviewed.    ED Treatments / Results  Labs (all labs ordered are listed, but only abnormal results are displayed) Labs Reviewed  COMPREHENSIVE METABOLIC PANEL - Abnormal; Notable for the following components:      Result Value   Glucose, Bld 234 (*)    BUN 32 (*)    Creatinine, Ser 1.29 (*)    Albumin 3.3 (*)    GFR calc non Af Amer 39 (*)    GFR calc Af Amer 45 (*)    All other components within normal limits  CBC WITH DIFFERENTIAL/PLATELET - Abnormal; Notable for the following components:   RBC 3.76 (*)    Hemoglobin 11.2 (*)    HCT 35.7 (*)    All other components within normal limits  URINALYSIS, ROUTINE W REFLEX MICROSCOPIC  I-STAT CG4 LACTIC ACID, ED  I-STAT CG4 LACTIC ACID, ED    EKG None  Radiology No results found.  Procedures Procedures (including critical care time)  Medications Ordered in ED Medications - No data to  display   Initial Impression / Assessment and Plan / ED Course  I have reviewed the triage vital signs and the nursing notes.  Pertinent labs & imaging results that were available during my care of the patient were reviewed by me and considered in my medical decision making (see chart for details).  Clinical Course as of Apr 11 1450  Tue Apr 11, 2018  1449 Per the vascular technician the ultrasound is negative for DVT.  Given her negative leukocytosis, preserved renal function and electrolytes with glucose and her vital signs which showed no fever tachycardia or hypotension I feel it is comfortable saying that she can go home and finish her antibiotics with routine follow-up.  The patient was informed and agreeable to discharge   [BM]    Clinical Course User Index [BM] Noemi Chapel, MD   There is no leukocytosis, her blood sugar is slightly elevated at 230, creatinine is at baseline at 1.29, the patient will benefit from another ultrasound to confirm that there is in fact no blood clot and if this is in fact the case than obviously the cephalexin and doxycycline is improving her symptoms, she is having more than adequate coverage for staph both MSSA and MRSA.  The patient is otherwise well-appearing with no fevers or tachycardias.  Lactic acid is normal  Final Clinical Impressions(s) / ED Diagnoses   Final diagnoses:  Cellulitis of left leg  Pain and swelling of left lower leg      Noemi Chapel, MD 04/11/18 1451

## 2018-04-11 NOTE — ED Notes (Signed)
Pt  Verbalized understanding of d/c instructions and has no further questions, VSS, NAD.

## 2018-04-11 NOTE — Discharge Instructions (Signed)
Your blood work today is totally normal with no signs of elevated blood counts, your ultrasound shows no signs of blood clots and with your leg continuing to improve I would encourage you to finish your antibiotics and have Dr. Stephanie Acre do a repeat exam within 3 or 4 days.  If however you should develop increasing swelling pain fevers redness or any severe or worsening symptoms, you should return to the emergency department.

## 2018-04-14 DIAGNOSIS — L03119 Cellulitis of unspecified part of limb: Secondary | ICD-10-CM | POA: Diagnosis not present

## 2018-04-17 ENCOUNTER — Other Ambulatory Visit: Payer: PPO

## 2018-04-17 ENCOUNTER — Other Ambulatory Visit: Payer: Self-pay | Admitting: Endocrinology

## 2018-04-17 ENCOUNTER — Telehealth: Payer: Self-pay | Admitting: Endocrinology

## 2018-04-17 NOTE — Telephone Encounter (Signed)
Patient stated the pharmacy told her they have not received a prescription for her pen needles or test strips   metFORMIN (GLUCOPHAGE) 500 MG tablet Patient would also like to know if this medication could be called in for 3 month supply instead of every month     glucose blood (FREESTYLE LITE) test strip    [id number] - [place], [place] - [address] [place] [address]

## 2018-04-18 ENCOUNTER — Other Ambulatory Visit: Payer: Self-pay

## 2018-04-18 MED ORDER — GLUCOSE BLOOD VI STRP: ORAL_STRIP | 1 refills | Status: DC

## 2018-04-18 MED ORDER — METFORMIN HCL 500 MG PO TABS: ORAL_TABLET | ORAL | 0 refills | Status: DC

## 2018-04-18 NOTE — Telephone Encounter (Signed)
I have sent to patient;'s pharmacy.  

## 2018-04-20 ENCOUNTER — Ambulatory Visit: Payer: PPO | Admitting: Endocrinology

## 2018-05-12 ENCOUNTER — Other Ambulatory Visit: Payer: Self-pay | Admitting: Cardiology

## 2018-05-12 NOTE — Telephone Encounter (Signed)
Eliquis 5mg  refill request received; pt is 77 yrs old, wt-112kg, Crea-1.29 on 04/11/18, last seen by [name] on 02/02/18; will send in refill to requested pharmacy.

## 2018-05-22 ENCOUNTER — Other Ambulatory Visit (INDEPENDENT_AMBULATORY_CARE_PROVIDER_SITE_OTHER): Payer: PPO

## 2018-05-22 DIAGNOSIS — Z794 Long term (current) use of insulin: Secondary | ICD-10-CM

## 2018-05-22 DIAGNOSIS — E1165 Type 2 diabetes mellitus with hyperglycemia: Secondary | ICD-10-CM

## 2018-05-22 DIAGNOSIS — B0052 Herpesviral keratitis: Secondary | ICD-10-CM | POA: Diagnosis not present

## 2018-05-22 DIAGNOSIS — Z961 Presence of intraocular lens: Secondary | ICD-10-CM | POA: Diagnosis not present

## 2018-05-22 DIAGNOSIS — H02889 Meibomian gland dysfunction of unspecified eye, unspecified eyelid: Secondary | ICD-10-CM | POA: Diagnosis not present

## 2018-05-22 DIAGNOSIS — Z947 Corneal transplant status: Secondary | ICD-10-CM | POA: Diagnosis not present

## 2018-05-22 DIAGNOSIS — H40059 Ocular hypertension, unspecified eye: Secondary | ICD-10-CM | POA: Diagnosis not present

## 2018-05-22 DIAGNOSIS — Z9841 Cataract extraction status, right eye: Secondary | ICD-10-CM | POA: Diagnosis not present

## 2018-05-22 DIAGNOSIS — H25812 Combined forms of age-related cataract, left eye: Secondary | ICD-10-CM | POA: Diagnosis not present

## 2018-05-22 LAB — BASIC METABOLIC PANEL
BUN: 29 mg/dL — ABNORMAL HIGH (ref 6–23)
CALCIUM: 9.2 mg/dL (ref 8.4–10.5)
CHLORIDE: 103 meq/L (ref 96–112)
CO2: 28 meq/L (ref 19–32)
Creatinine, Ser: 1.21 mg/dL — ABNORMAL HIGH (ref 0.40–1.20)
GFR: 45.82 mL/min — ABNORMAL LOW (ref >60.00)
Glucose, Bld: 181 mg/dL — ABNORMAL HIGH (ref 70–99)
Potassium: 4.1 mEq/L (ref 3.5–5.1)
SODIUM: 139 meq/L (ref 135–145)

## 2018-05-22 LAB — HEMOGLOBIN A1C: Hgb A1c MFr Bld: 8.2 % — ABNORMAL HIGH (ref 4.6–6.5)

## 2018-05-22 NOTE — Progress Notes (Signed)
Patient ID: Tina Hester, female   DOB: 08-04-1941, 77 y.o.   MRN: [id number]   Reason for Appointment: Diabetes follow-up   History of Present Illness   Diagnosis: Type 2 DIABETES MELITUS, long-standing    Previous history:  She has been on insulin for a few years. Because of cost she has been switched from Lantus to NPH insulin twice a day She had previously been taking metformin but this was stopped when she had renal insufficiency Overall her blood sugars have been difficult to control and she has previously had relatively poor control. However with adding Actos her blood sugars had started improving significantly Also with her being very consistent with diet in 2015 her A1c had gone down to 6.4 To improve her control she was given a trial of the V-go pump in 2/17 but she had allergic skin reaction with the adhesive  Recent history:    Insulin regimen: Humulin N 20 noon, 25 HS; R 5 acl    Oral hypoglycemic drugs: Actos 15 mg, Metformin 500 mg in a.m.-1000 mg   Her A1c is significantly higher at 8.2 compared to 7.3  Current blood sugar patterns, management and problems identified:   She did not bring her monitor for download  She thinks her blood sugars have been out of control because of traveling as well as going to weddings  She is taking her morning insulin somewhat randomly and mostly around midday when though she is eating breakfast in the morning frequently around 10 AM  Her diet has been inconsistent, sometimes may have a take out breakfast at [employer] in the morning  Blood sugar was 181 in the lab but she does not think she has seen blood sugars over 150 at home  Since she thinks she is eating late in the evening she does not check her sugars after supper and may check some readings in the afternoon  She thinks her fasting readings are fairly good usually unless she is eating late in the evening  She is not doing any formal exercise because of  leg pains                 Side effects from medications:She did not tolerate Invokana and was having muscle cramps  Monitors blood glucose: occasionally     Glucometer:  FreeStyle       Blood Glucose readings from recall:   PRE-MEAL Fasting Lunch Dinner Bedtime Overall  Glucose range: 110-140      Mean/median:        POST-MEAL PC Breakfast PC Lunch PC Dinner  Glucose range:  140+   Mean/median:       Meals: 2-3 meals per day. Bfst 10 Dinner around 7-8 pm  Am: usually egg and toast at 11 am       Physical activity: exercise:  None  Wt Readings from Last 3 Encounters:  05/23/18 246 lb (111.6 kg)  02/02/18 247 lb (112 kg)  01/16/18 247 lb 3.2 oz (112.1 kg)   LABS:6.7  Lab Results  Component Value Date   HGBA1C 8.2 (H) 05/22/2018   HGBA1C 7.3 (H) 01/10/2018   HGBA1C 7.6 (H) 10/11/2017   Lab Results  Component Value Date   MICROALBUR 6.1 (H) 01/10/2018   LDLCALC 50 01/10/2018   CREATININE 1.21 (H) 05/22/2018   Lab on 05/22/2018  Component Date Value Ref Range Status  . Sodium 05/22/2018 139  135 - 145 mEq/L Final  . Potassium 05/22/2018 4.1  3.5 -  5.1 mEq/L Final  . Chloride 05/22/2018 103  96 - 112 mEq/L Final  . CO2 05/22/2018 28  19 - 32 mEq/L Final  . Glucose, Bld 05/22/2018 181* 70 - 99 mg/dL Final  . BUN 05/22/2018 29* 6 - 23 mg/dL Final  . Creatinine, Ser 05/22/2018 1.21* 0.40 - 1.20 mg/dL Final  . Calcium 05/22/2018 9.2  8.4 - 10.5 mg/dL Final  . GFR 05/22/2018 45.82* >60.00 mL/min Final  . Hgb A1c MFr Bld 05/22/2018 8.2* 4.6 - 6.5 % Final   Glycemic Control Guidelines for People with Diabetes:Non Diabetic:  <6%Goal of Therapy: <7%Additional Action Suggested:  >8%      Allergies as of 05/23/2018      Reactions   Pneumococcal Vaccine Anaphylaxis   weakness   Pneumovax 23 [pneumococcal Vac Polyvalent] Anaphylaxis   weakness   Other Hives   Other reaction(s): Other (See Comments) Pneumonia  Vaccine- very ill   Sulfa Antibiotics Hives   Lisinopril     Stopped in 2019 due to hyperkalemia      Medication List        Accurate as of 05/23/18 11:15 AM. Always use your most recent med list.          acyclovir 400 MG tablet Commonly known as:  ZOVIRAX Take 400 mg by mouth 2 (two) times daily.   ALPRAZolam 0.25 MG tablet Commonly known as:  XANAX Take 0.25 mg by mouth at bedtime as needed for anxiety.   amLODipine 5 MG tablet Commonly known as:  NORVASC Take 1 tablet (5 mg total) by mouth daily.   atorvastatin 40 MG tablet Commonly known as:  LIPITOR Take 1 tablet (40 mg total) by mouth daily at 6 PM.   CALCIUM PO Take 1 tablet by mouth daily.   COLCRYS 0.6 MG tablet Generic drug:  colchicine Take 0.6 mg by mouth daily as needed (gout).   ELIQUIS 5 MG Tabs tablet Generic drug:  apixaban TAKE 1 TABLET(5 MG) BY MOUTH TWICE DAILY   gabapentin 100 MG capsule Commonly known as:  NEURONTIN Take 100-200 mg by mouth at bedtime.   glucose blood test strip Use to test blood sugar twice daily. Dx: E11.9   HUMULIN N 100 UNIT/ML injection Generic drug:  insulin NPH Human Inject 20-25 Units into the skin See admin instructions. INJECT 20 UNITS EVERY MORNING AND 25 UNITS EVERY NIGHT   hydrochlorothiazide 25 MG tablet Commonly known as:  HYDRODIURIL Take 1 tablet (25 mg total) by mouth daily.   insulin regular 100 units/mL injection Commonly known as:  NOVOLIN R,HUMULIN R Inject 0.06-0.1 mLs (6-10 Units total) into the skin 3 (three) times daily before meals.   INSULIN SYRINGE .5CC/30GX5/16" 30G X 5/16" 0.5 ML Misc Use 2 needles per day to inject insulin   levothyroxine 150 MCG tablet Commonly known as:  SYNTHROID, LEVOTHROID Take 1 tablet (150 mcg total) by mouth daily before breakfast.   magnesium 30 MG tablet Take 30 mg by mouth at bedtime.   metFORMIN 500 MG tablet Commonly known as:  GLUCOPHAGE TAKE 1 TABLET BY MOUTH EVERY MORNING AND 2 TABLETS BY MOUTH EVERY EVENING   NEEDLE (DISP) 30 G 30G X 1/2" Misc 1 each  by Does not apply route 2 (two) times daily before a meal.   ONETOUCH DELICA LANCETS 16X Misc Use to check blood sugars once daily   pioglitazone 15 MG tablet Commonly known as:  ACTOS TAKE 1 TABLET BY MOUTH EVERY DAY   prednisoLONE acetate 1 %  ophthalmic suspension Commonly known as:  PRED FORTE Place 2 drops into the right eye daily.   timolol 0.5 % ophthalmic solution Commonly known as:  TIMOPTIC USE 1 DROP IN RIGHT EYE D   vitamin B-12 1000 MCG tablet Commonly known as:  CYANOCOBALAMIN Take 1,000 mcg by mouth daily.   vitamin C 500 MG tablet Commonly known as:  ASCORBIC ACID Take 500 mg by mouth daily.   Vitamin D2 2000 units Tabs Take 2,000 Units by mouth daily.       Allergies:  Allergies  Allergen Reactions  . Pneumococcal Vaccine Anaphylaxis    weakness  . Pneumovax 23 [Pneumococcal Vac Polyvalent] Anaphylaxis    weakness  . Other Hives    Other reaction(s): Other (See Comments) Pneumonia  Vaccine- very ill  . Sulfa Antibiotics Hives  . Lisinopril     Stopped in 2019 due to hyperkalemia    Past Medical History:  Diagnosis Date  . Anemia    s/p Heme work -up normal EGD and colonscopy in 2012 per pt,neg SPEP  . Anxiety   . Aortic stenosis    very mild by echo 05/2017  . Bilateral carotid artery stenosis 06/08/2015   1-39% bilateral  . Bradycardia   . CKD (chronic kidney disease), stage III ([place])   . Diabetes mellitus without complication ([place])    type 2  . GERD (gastroesophageal reflux disease)   . Gout   . HSV-1 (herpes simplex virus 1) infection    Acyclovir prn  . Hyperkalemia   . Hyperlipidemia   . Hypertension   . Joint pain    osteoarthritis by Xray- possible erosion of R 4th MCP,elevated uric  acid ,ANA +   . Morbid obesity ([place])   . OSA on CPAP   . Osteopenia   . PAF (paroxysmal atrial fibrillation) ([place])   . Pain management    Neurosurg: Dr Clydell Hakim  . Pulmonary HTN ([place])    Moderate by echo  2016. PASP 43mmHg  . TIA  (transient ischemic attack)    remote history of TIA's early 2000    Past Surgical History:  Procedure Laterality Date  . ABDOMINAL HYSTERECTOMY    . APPENDECTOMY    . BACK SURGERY     cervical fusion  . BLADDER SURGERY    . CESAREAN SECTION    . CHOLECYSTECTOMY      Family History  Problem Relation Age of Onset  . Arrhythmia Brother     Social History:  reports that she has never smoked. She has never used smokeless tobacco. She reports that she does not drink alcohol or use drugs.  Review of Systems:   Hypertension:   This has been managed by her PCP or nephrologist She is off lisinopril and is taking amlodipine and HCTZ  Lipids: Triglycerides have been high Previously, now controlled off fenofibrate, LDL has been below 100 with Lipitor 40 mg   Lab Results  Component Value Date   CHOL 114 01/10/2018   HDL 31.50 (L) 01/10/2018   LDLCALC 50 01/10/2018   LDLDIRECT 97.2 05/31/2013   TRIG 163.0 (H) 01/10/2018   CHOLHDL 4 01/10/2018    Renal insufficiency: Her creatinine is increased mildly as before, followed by nephrologist She is not taking lisinopril because of hyperkalemia previously   Lab Results  Component Value Date   CREATININE 1.21 (H) 05/22/2018    She has hypothyroidism and is taking 150g levothyroxine, last TSH is normal    Lab Results  Component Value  Date   TSH 1.20 01/10/2018    Taking gabapentin 100 mg as needed for mild burning and tingling      Examination:   BP 124/78   Pulse (!) 102   Ht 5\' 1"  (1.549 m)   Wt 246 lb (111.6 kg)   SpO2 98%   BMI 46.48 kg/m   Body mass index is 46.48 kg/m.   No ankle edema present    ASSESSMENT/ PLAN:   Diabetes type 2 with obesity, insulin requiring:    See history of present illness for detailed discussion of  current management, blood sugar patterns and issues identified  Her A1c is significantly higher at 8.2 compared to 7.3  She is still mostly on NPH insulin along with  metformin and low-dose Actos  Most of her high readings are likely to be from postprandial hyperglycemia from her not taking mealtime insulin or her morning NPH before her first meal Also she has had irregular eating habits, traveling and not watching her diet especially when not eating at home With her minimal glucose monitoring difficult to know what her blood sugar patterns are Lab glucose 181 after lunch She is not aware of blood sugar targets after meals and this was discussed  Recommendations:  To start checking blood sugars consistently at least once a day at various times including after meals and also some before and after dinner  She may check her sugar at bedtime regardless of how long it is after eating  If her blood sugars are more than 200 when she takes 5 units of regular insulin for large evening meals she will increase the dose to 10 units  She will try to take 25 units of NPH in the morning consistently and before she is eating her first meal  Less fast food  Try to walk as much as possible Metformin will be continued unchanged as renal function adequate for her dose of 1500 mg   RENAL dysfunction: Stable  Mild hypertension: Stable   Patient Instructions  Insulin 25 U in am and take daily  Check blood sugars on waking up    Also check blood sugars about 2 hours after a meal and do this after different meals by rotation  Recommended blood sugar levels on waking up is 90-130 and about 2 hours after meal is 130-160  Please bring your blood sugar monitor to each visit, thank you  Decide on R insulin based on bedtime sugar  Counseling time on subjects discussed in assessment and plan sections is over 50% of today's 25 minute visit   Elayne Snare 05/23/2018, 11:15 AM

## 2018-05-23 ENCOUNTER — Ambulatory Visit (INDEPENDENT_AMBULATORY_CARE_PROVIDER_SITE_OTHER): Payer: PPO | Admitting: Endocrinology

## 2018-05-23 ENCOUNTER — Encounter: Payer: Self-pay | Admitting: Endocrinology

## 2018-05-23 VITALS — BP 124/78 | HR 102 | Ht 61.0 in | Wt 246.0 lb

## 2018-05-23 DIAGNOSIS — I10 Essential (primary) hypertension: Secondary | ICD-10-CM | POA: Diagnosis not present

## 2018-05-23 DIAGNOSIS — E063 Autoimmune thyroiditis: Secondary | ICD-10-CM | POA: Diagnosis not present

## 2018-05-23 DIAGNOSIS — Z23 Encounter for immunization: Secondary | ICD-10-CM

## 2018-05-23 DIAGNOSIS — E1165 Type 2 diabetes mellitus with hyperglycemia: Secondary | ICD-10-CM | POA: Diagnosis not present

## 2018-05-23 DIAGNOSIS — Z794 Long term (current) use of insulin: Secondary | ICD-10-CM

## 2018-05-23 MED ORDER — "INSULIN SYRINGE-NEEDLE U-100 31G X 5/16"" 1 ML MISC": 0 refills | Status: DC

## 2018-05-23 MED ORDER — METFORMIN HCL 500 MG PO TABS
ORAL_TABLET | ORAL | 3 refills | Status: DC
Start: 2018-05-23 — End: 2019-06-27

## 2018-05-23 NOTE — Patient Instructions (Addendum)
Insulin 25 U in am and take daily  Check blood sugars on waking up    Also check blood sugars about 2 hours after a meal and do this after different meals by rotation  Recommended blood sugar levels on waking up is 90-130 and about 2 hours after meal is 130-160  Please bring your blood sugar monitor to each visit, thank you  Decide on R insulin based on bedtime sugar

## 2018-05-24 ENCOUNTER — Other Ambulatory Visit: Payer: Self-pay | Admitting: Endocrinology

## 2018-05-25 ENCOUNTER — Ambulatory Visit: Payer: PPO | Admitting: Cardiology

## 2018-05-25 ENCOUNTER — Encounter: Payer: Self-pay | Admitting: Cardiology

## 2018-05-25 VITALS — BP 126/70 | HR 68 | Ht 61.0 in | Wt 245.4 lb

## 2018-05-25 DIAGNOSIS — I10 Essential (primary) hypertension: Secondary | ICD-10-CM | POA: Diagnosis not present

## 2018-05-25 DIAGNOSIS — I6523 Occlusion and stenosis of bilateral carotid arteries: Secondary | ICD-10-CM | POA: Diagnosis not present

## 2018-05-25 DIAGNOSIS — I350 Nonrheumatic aortic (valve) stenosis: Secondary | ICD-10-CM

## 2018-05-25 DIAGNOSIS — E7800 Pure hypercholesterolemia, unspecified: Secondary | ICD-10-CM | POA: Diagnosis not present

## 2018-05-25 DIAGNOSIS — I481 Persistent atrial fibrillation: Secondary | ICD-10-CM

## 2018-05-25 DIAGNOSIS — Z9989 Dependence on other enabling machines and devices: Secondary | ICD-10-CM

## 2018-05-25 DIAGNOSIS — I4819 Other persistent atrial fibrillation: Secondary | ICD-10-CM

## 2018-05-25 DIAGNOSIS — G4733 Obstructive sleep apnea (adult) (pediatric): Secondary | ICD-10-CM | POA: Diagnosis not present

## 2018-05-25 MED ORDER — AMLODIPINE BESYLATE 5 MG PO TABS: 5.0000 mg | ORAL_TABLET | Freq: Every day | ORAL | 3 refills | Status: DC

## 2018-05-25 NOTE — Progress Notes (Signed)
Cardiology Office Note:    Date:  05/25/2018   ID:  Tina Hester, DOB 04-28-1941, MRN [id number]  PCP:  Jonathon Jordan, MD  Cardiologist:  Fransico Him, MD    Referring MD: Jonathon Jordan, MD   Chief Complaint  Patient presents with  . Atrial Fibrillation  . Sleep Apnea  . Hypertension    History of Present Illness:    Tina Hester is a 77 y.o. female with a hx of PAF, HTN, bilateral carotid artery stenosis, aortic stenosis, morbid obesity and OSA on CPAP.    She has recently been maintained on Multitak for suppression of her atrial fibrillation.  Last seen in June by Sharrell Ku, PA and was found to be in atrial fibrillation and alt.was stopped.  Weight loss was recommended.  He called in late June complaining of an episode of chest pain.  She was also having lower extremity edema her amlodipine was decreased to 5 mg daily.  Stress test was recommended and patient wanted to hold off.  She is here today for followup and is doing well.  She denies any chest pain or pressure, SOB, DOE, PND, orthopnea or syncope.  She says that occasionally she will have some problems with lower extremity edema if she eats too much sodium.  Occasionally she will feel palpitations at times.  She is compliant with her meds and is tolerating meds with no SE.  She is doing well with her CPAP device and thinks that she has gotten used to it.  She tolerates the mask and feels the pressure is adequate.  Since going on CPAP she feels rested in the am and has no significant daytime sleepiness.  She denies any significant mouth or nasal dryness or nasal congestion.  She does not think that he snores.     Past Medical History:  Diagnosis Date  . Anemia    s/p Heme work -up normal EGD and colonscopy in 2012 per pt,neg SPEP  . Anxiety   . Aortic stenosis    very mild by echo 05/2017  . Bilateral carotid artery stenosis 06/08/2015   1-39% bilateral  . Bradycardia   . CKD (chronic kidney disease), stage  III ([place])   . Diabetes mellitus without complication ([place])    type 2  . GERD (gastroesophageal reflux disease)   . Gout   . HSV-1 (herpes simplex virus 1) infection    Acyclovir prn  . Hyperkalemia   . Hyperlipidemia   . Hypertension   . Joint pain    osteoarthritis by Xray- possible erosion of R 4th MCP,elevated uric  acid ,ANA +   . Morbid obesity ([place])   . OSA on CPAP   . Osteopenia   . PAF (paroxysmal atrial fibrillation) ([place])   . Pain management    Neurosurg: Dr Clydell Hakim  . Pulmonary HTN ([place])    Moderate by echo  2016. PASP 19mmHg  . TIA (transient ischemic attack)    remote history of TIA's early 2000    Past Surgical History:  Procedure Laterality Date  . ABDOMINAL HYSTERECTOMY    . APPENDECTOMY    . BACK SURGERY     cervical fusion  . BLADDER SURGERY    . CESAREAN SECTION    . CHOLECYSTECTOMY      Current Medications: Current Meds  Medication Sig  . acyclovir (ZOVIRAX) 400 MG tablet Take 400 mg by mouth 2 (two) times daily.   Marland Kitchen ALPRAZolam (XANAX) 0.25 MG tablet Take 0.25 mg  by mouth at bedtime as needed for anxiety.  Marland Kitchen amLODipine (NORVASC) 5 MG tablet Take 1 tablet (5 mg total) by mouth daily.  Marland Kitchen atorvastatin (LIPITOR) 40 MG tablet Take 1 tablet (40 mg total) by mouth daily at 6 PM.  . CALCIUM PO Take 1 tablet by mouth daily.   Marland Kitchen COLCRYS 0.6 MG tablet Take 0.6 mg by mouth daily as needed (gout).   Marland Kitchen ELIQUIS 5 MG TABS tablet TAKE 1 TABLET(5 MG) BY MOUTH TWICE DAILY  . Ergocalciferol (VITAMIN D2) 2000 units TABS Take 2,000 Units by mouth daily.   Marland Kitchen gabapentin (NEURONTIN) 100 MG capsule Take 100-200 mg by mouth at bedtime.   Marland Kitchen glucose blood (FREESTYLE LITE) test strip Use to test blood sugar twice daily. Dx: E11.9  . hydrochlorothiazide (HYDRODIURIL) 25 MG tablet Take 1 tablet (25 mg total) by mouth daily.  . insulin NPH Human (HUMULIN N) 100 UNIT/ML injection Inject 20-25 Units into the skin See admin instructions. INJECT 20 UNITS EVERY MORNING AND 25  UNITS EVERY NIGHT  . insulin regular (NOVOLIN R RELION) 100 units/mL injection Inject 0.06-0.1 mLs (6-10 Units total) into the skin 3 (three) times daily before meals. (Patient taking differently: Inject 6-10 Units into the skin 2 (two) times daily as needed (only when eating a big meal). )  . Insulin Syringe-Needle U-100 (B-D INS SYR ULTRAFINE 1CC/31G) 31G X 5/16" 1 ML MISC Use 3 times daily to inject insulin  . levothyroxine (SYNTHROID, LEVOTHROID) 150 MCG tablet Take 1 tablet (150 mcg total) by mouth daily before breakfast.  . magnesium 30 MG tablet Take 30 mg by mouth at bedtime.   . metFORMIN (GLUCOPHAGE) 500 MG tablet TAKE 1 TABLET BY MOUTH EVERY MORNING AND 2 TABLETS BY MOUTH EVERY EVENING  . metoprolol succinate (TOPROL-XL) 25 MG 24 hr tablet Take 25 mg by mouth 2 (two) times daily.  Marland Kitchen NEEDLE, DISP, 30 G (BD DISP NEEDLES) 30G X 1/2" MISC 1 each by Does not apply route 2 (two) times daily before a meal.  . ONETOUCH DELICA LANCETS 22W MISC Use to check blood sugars once daily  . pioglitazone (ACTOS) 15 MG tablet TAKE 1 TABLET BY MOUTH EVERY DAY  . prednisoLONE acetate (PRED FORTE) 1 % ophthalmic suspension Place 2 drops into the right eye daily.  . timolol (TIMOPTIC) 0.5 % ophthalmic solution USE 1 DROP IN RIGHT EYE D  . vitamin B-12 (CYANOCOBALAMIN) 1000 MCG tablet Take 1,000 mcg by mouth daily.  . vitamin C (ASCORBIC ACID) 500 MG tablet Take 500 mg by mouth daily.     Allergies:   Pneumococcal vaccine; Pneumovax 23 [pneumococcal vac polyvalent]; Other; Sulfa antibiotics; and Lisinopril   Social History   Socioeconomic History  . Marital status: Divorced    Spouse name: Not on file  . Number of children: Not on file  . Years of education: Not on file  . Highest education level: Not on file  Occupational History  . Not on file  Social Needs  . Financial resource strain: Not on file  . Food insecurity:    Worry: Not on file    Inability: Not on file  . Transportation needs:     Medical: Not on file    Non-medical: Not on file  Tobacco Use  . Smoking status: Never Smoker  . Smokeless tobacco: Never Used  Substance and Sexual Activity  . Alcohol use: No  . Drug use: No  . Sexual activity: Not on file  Lifestyle  . Physical  activity:    Days per week: Not on file    Minutes per session: Not on file  . Stress: Not on file  Relationships  . Social connections:    Talks on phone: Not on file    Gets together: Not on file    Attends religious service: Not on file    Active member of club or organization: Not on file    Attends meetings of clubs or organizations: Not on file    Relationship status: Not on file  Other Topics Concern  . Not on file  Social History Narrative  . Not on file     Family History: The patient's family history includes Arrhythmia in her brother.  ROS:   Please see the history of present illness.    ROS  All other systems reviewed and negative.   EKGs/Labs/Other Studies Reviewed:    The following studies were reviewed today: PAP download  EKG:  EKG is ordered today and showed atrial fibrillation with RVR at 99bpm  Recent Labs: 12/13/2017: B Natriuretic Peptide 243.0 01/10/2018: TSH 1.20 02/02/2018: Magnesium 2.2 04/11/2018: ALT 15; Hemoglobin 11.2; Platelets 188 05/22/2018: BUN 29; Creatinine, Ser 1.21; Potassium 4.1; Sodium 139   Recent Lipid Panel    Component Value Date/Time   CHOL 114 01/10/2018 0831   TRIG 163.0 (H) 01/10/2018 0831   HDL 31.50 (L) 01/10/2018 0831   CHOLHDL 4 01/10/2018 0831   VLDL 32.6 01/10/2018 0831   LDLCALC 50 01/10/2018 0831   LDLDIRECT 97.2 05/31/2013 0942    Physical Exam:    VS:  BP 126/70   Pulse 68   Ht 5\' 1"  (1.549 m)   Wt 245 lb 6.4 oz (111.3 kg)   SpO2 98%   BMI 46.37 kg/m     Wt Readings from Last 3 Encounters:  05/25/18 245 lb 6.4 oz (111.3 kg)  05/23/18 246 lb (111.6 kg)  02/02/18 247 lb (112 kg)     GEN:  Well nourished, well developed in no acute distress HEENT:  Normal NECK: No JVD; No carotid bruits LYMPHATICS: No lymphadenopathy CARDIAC: irregularly irregular, no murmurs, rubs, gallops RESPIRATORY:  Clear to auscultation without rales, wheezing or rhonchi  ABDOMEN: Soft, non-tender, non-distended MUSCULOSKELETAL:  No edema; No deformity  SKIN: Warm and dry NEUROLOGIC:  Alert and oriented x 3 PSYCHIATRIC:  Normal affect   ASSESSMENT:    1. Persistent atrial fibrillation ([place])   2. Essential hypertension   3. Bilateral carotid artery stenosis   4. Nonrheumatic aortic valve stenosis   5. Pure hypercholesterolemia   6. Morbid obesity ([place])   7. OSA on CPAP    PLAN:    In order of problems listed above:  1.  Persistent atrial fibrillation -She appears to be back in atrial fibrillation.  She will continue Toprol 25 mg twice daily and I will refer her back to A. fib clinic.Marland Kitchen  She will continue on Eliquis 5 mg BID.  Hemoglobin was stable at 11.2 2019.  I have encouraged her to try to lose weight as this is likely a contributing factor.  2.  HTN -BP is controlled.  She will continue on amlodipine 5 mg daily, HCTZ 25 mg daily and Toprol.  Creatinine was stable at 1.2   3.  Bilateral carotid artery stenosis -carotid Dopplers showed bilateral carotid stenosis 1 to 39%.  She will continue on statin therapy.  She is not on ASA due to Eliquis.  4.  Mild AS -mean aortic valve gradient was 15  mmHg on echo 2018  5.  Hyperlipidemia -with carotid artery stenosis LDL goal is less than 70.  LDL was 50 on 01/10/2018 with normal ALT.  She will continue on atorvastatin 40 mg daily.  6.  Morbid obesity - I have encouraged her to get into a routine exercise program and cut back on carbs and portions.   7.  OSA - the patient is tolerating PAP therapy well without any problems. The PAP download was reviewed today and showed an AHI of 5.3/hr on 10 cm H2O with 100% compliance in using more than 4 hours nightly.  The patient has been using and benefiting from PAP use  and will continue to benefit from therapy.      Medication Adjustments/Labs and Tests Ordered: Current medicines are reviewed at length with the patient today.  Concerns regarding medicines are outlined above.  No orders of the defined types were placed in this encounter.  No orders of the defined types were placed in this encounter.   Signed, Fransico Him, MD  05/25/2018 8:43 AM    [place]

## 2018-05-25 NOTE — Patient Instructions (Addendum)
Medication Instructions:  Your physician recommends that you continue on your current medications as directed. Please refer to the Current Medication list given to you today.  Follow-Up: Your physician wants you to follow-up in: 6 months with PA. You will receive a reminder letter in the mail two months in advance. If you don't receive a letter, please call our office to schedule the follow-up appointment.  Your physician wants you to follow-up in: 1 year with Dr. Radford Pax. You will receive a reminder letter in the mail two months in advance. If you don't receive a letter, please call our office to schedule the follow-up appointment.  Your physician recommends that you schedule a follow-up appointment in: Afib clinic appointment.  If you need a refill on your cardiac medications before your next appointment, please call your pharmacy.

## 2018-06-05 ENCOUNTER — Other Ambulatory Visit: Payer: Self-pay

## 2018-06-05 ENCOUNTER — Emergency Department (HOSPITAL_COMMUNITY)
Admission: EM | Admit: 2018-06-05 | Discharge: 2018-06-05 | Disposition: A | Discharge status: Home / Self Care | Payer: PPO | Attending: Emergency Medicine | Admitting: Emergency Medicine

## 2018-06-05 ENCOUNTER — Emergency Department (HOSPITAL_COMMUNITY): Payer: PPO

## 2018-06-05 ENCOUNTER — Encounter (HOSPITAL_COMMUNITY): Payer: Self-pay | Admitting: Emergency Medicine

## 2018-06-05 DIAGNOSIS — I480 Paroxysmal atrial fibrillation: Secondary | ICD-10-CM | POA: Diagnosis not present

## 2018-06-05 DIAGNOSIS — S0003XA Contusion of scalp, initial encounter: Secondary | ICD-10-CM | POA: Diagnosis not present

## 2018-06-05 DIAGNOSIS — Z23 Encounter for immunization: Secondary | ICD-10-CM | POA: Diagnosis not present

## 2018-06-05 DIAGNOSIS — I10 Essential (primary) hypertension: Secondary | ICD-10-CM | POA: Diagnosis not present

## 2018-06-05 DIAGNOSIS — E785 Hyperlipidemia, unspecified: Secondary | ICD-10-CM | POA: Insufficient documentation

## 2018-06-05 DIAGNOSIS — Z7901 Long term (current) use of anticoagulants: Secondary | ICD-10-CM | POA: Diagnosis not present

## 2018-06-05 DIAGNOSIS — I129 Hypertensive chronic kidney disease with stage 1 through stage 4 chronic kidney disease, or unspecified chronic kidney disease: Secondary | ICD-10-CM | POA: Insufficient documentation

## 2018-06-05 DIAGNOSIS — W010XXA Fall on same level from slipping, tripping and stumbling without subsequent striking against object, initial encounter: Secondary | ICD-10-CM | POA: Diagnosis not present

## 2018-06-05 DIAGNOSIS — Y939 Activity, unspecified: Secondary | ICD-10-CM | POA: Diagnosis not present

## 2018-06-05 DIAGNOSIS — N183 Chronic kidney disease, stage 3 (moderate): Secondary | ICD-10-CM | POA: Diagnosis not present

## 2018-06-05 DIAGNOSIS — S0990XA Unspecified injury of head, initial encounter: Secondary | ICD-10-CM | POA: Diagnosis not present

## 2018-06-05 DIAGNOSIS — Y998 Other external cause status: Secondary | ICD-10-CM | POA: Insufficient documentation

## 2018-06-05 DIAGNOSIS — W19XXXA Unspecified fall, initial encounter: Secondary | ICD-10-CM | POA: Diagnosis not present

## 2018-06-05 DIAGNOSIS — Z794 Long term (current) use of insulin: Secondary | ICD-10-CM | POA: Insufficient documentation

## 2018-06-05 DIAGNOSIS — Y929 Unspecified place or not applicable: Secondary | ICD-10-CM | POA: Diagnosis not present

## 2018-06-05 DIAGNOSIS — Z79899 Other long term (current) drug therapy: Secondary | ICD-10-CM | POA: Insufficient documentation

## 2018-06-05 DIAGNOSIS — I4891 Unspecified atrial fibrillation: Secondary | ICD-10-CM | POA: Diagnosis not present

## 2018-06-05 DIAGNOSIS — E1122 Type 2 diabetes mellitus with diabetic chronic kidney disease: Secondary | ICD-10-CM | POA: Insufficient documentation

## 2018-06-05 DIAGNOSIS — S0083XA Contusion of other part of head, initial encounter: Secondary | ICD-10-CM | POA: Diagnosis not present

## 2018-06-05 DIAGNOSIS — Z8673 Personal history of transient ischemic attack (TIA), and cerebral infarction without residual deficits: Secondary | ICD-10-CM | POA: Insufficient documentation

## 2018-06-05 IMAGING — CT CT HEAD W/O CM
4 series · 17 of 47 positions shown, 19 images · non-contrast
Comparison: None.

CLINICAL DATA: Fall, head injury.  On Eliquis.  Headache.

EXAM:
CT HEAD WITHOUT CONTRAST
TECHNIQUE: Contiguous axial images were obtained from the base of the skull
through the vertex without intravenous contrast.

[Series 3: head without · axial · non-contrast · 0.39mm/px · z∈[-38,+82]mm · 7 of 32 slices shown, 9 images]
[im 4/32  brain]
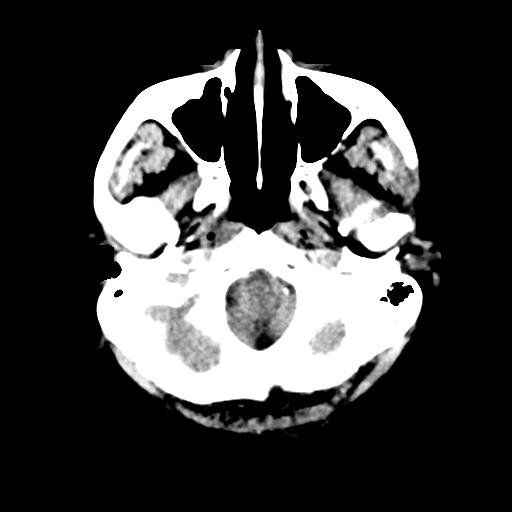
[im 4/32  bone]
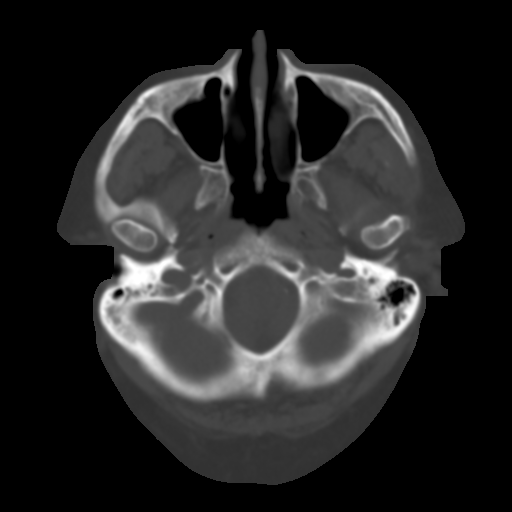
[im 8/32  brain]
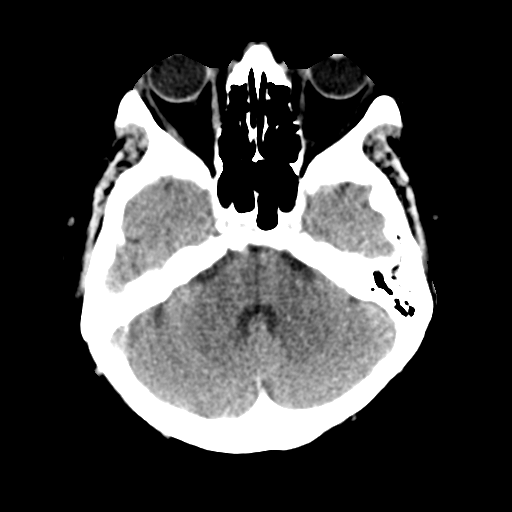
[im 12/32  brain]
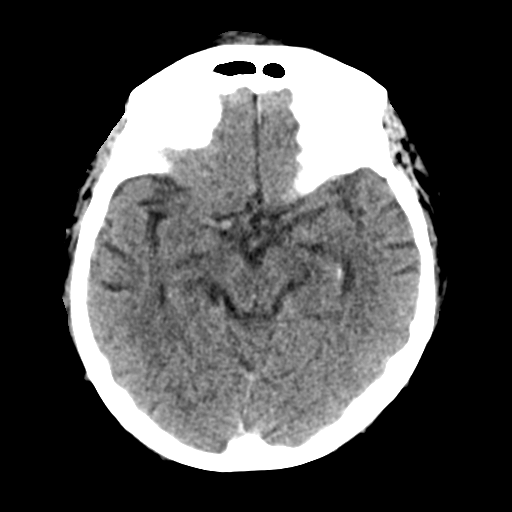
[im 16/32  brain]
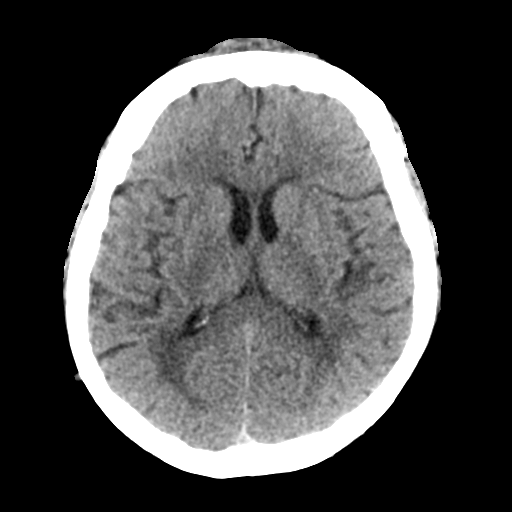
[im 20/32  brain]
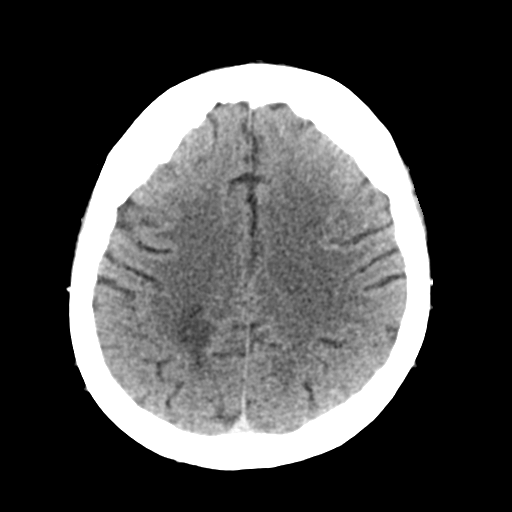
[im 20/32  bone]
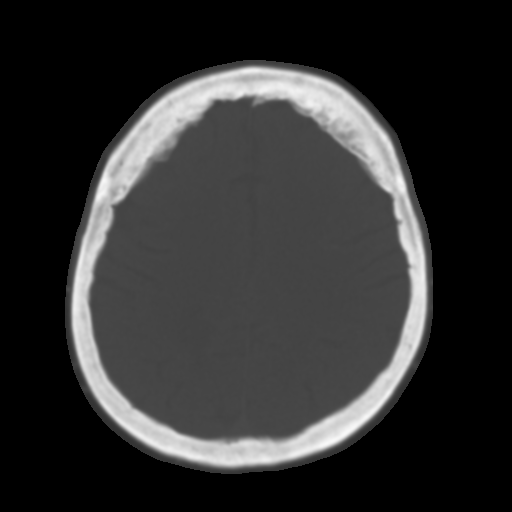
[im 24/32  brain]
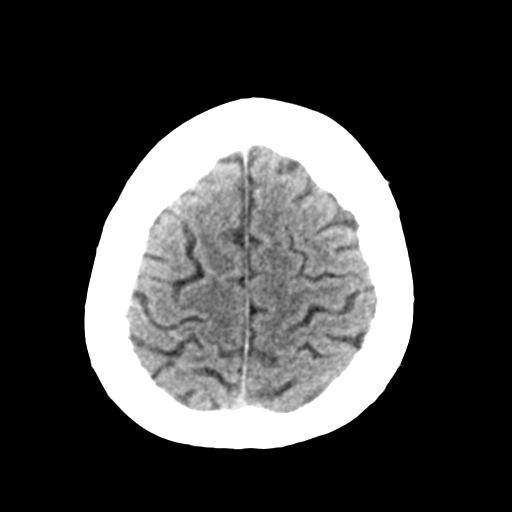
[im 28/32  brain]
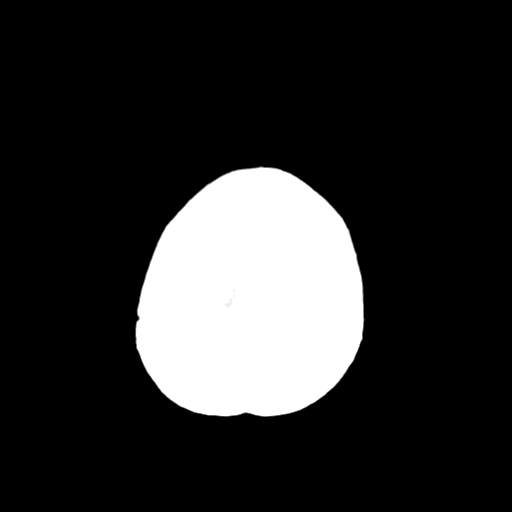

[Series 4: head bone · axial · 0.39mm/px · z∈[-39,+15]mm · 4 of 78 slices shown]
[im 8/78  bone]
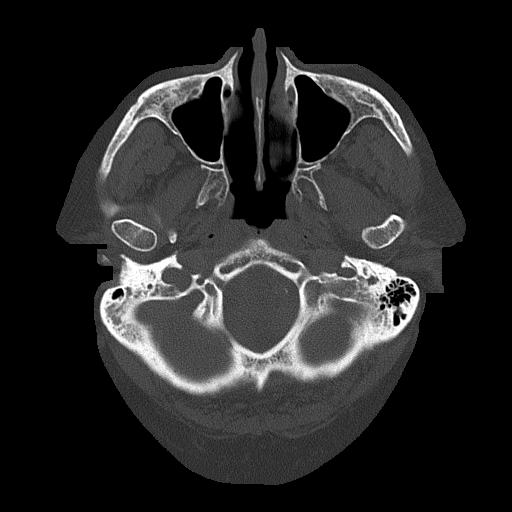
[im 16/78  bone]
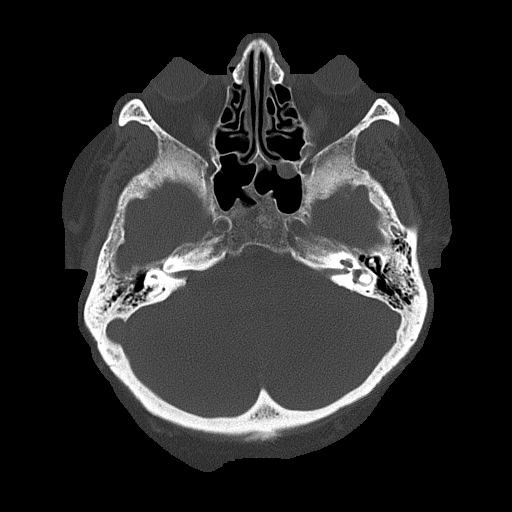
[im 24/78  bone]
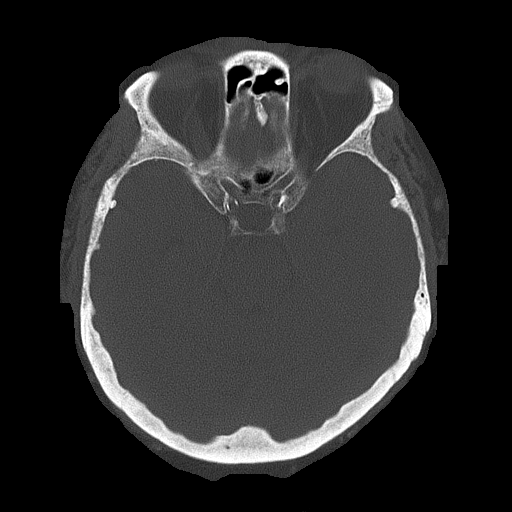
[im 35/78  bone]
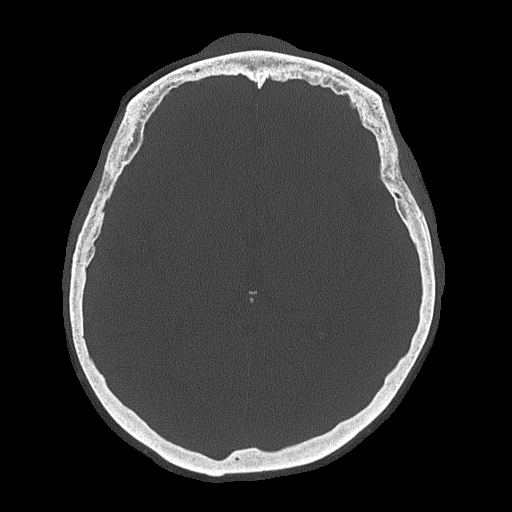

[Series 5: head without cor · coronal · non-contrast · 0.30mm/px · 3 of 63 slices shown]
[im 21/63  brain]
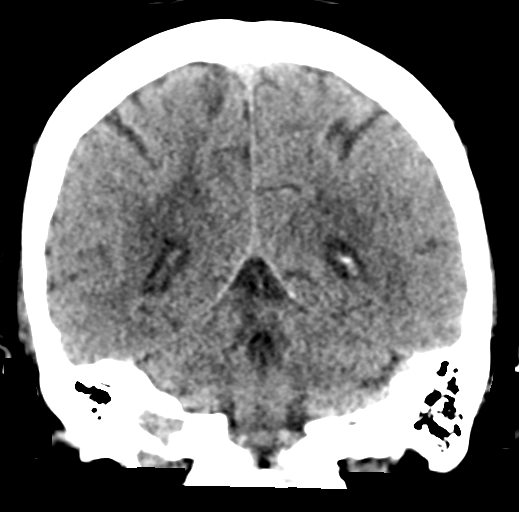
[im 28/63  brain]
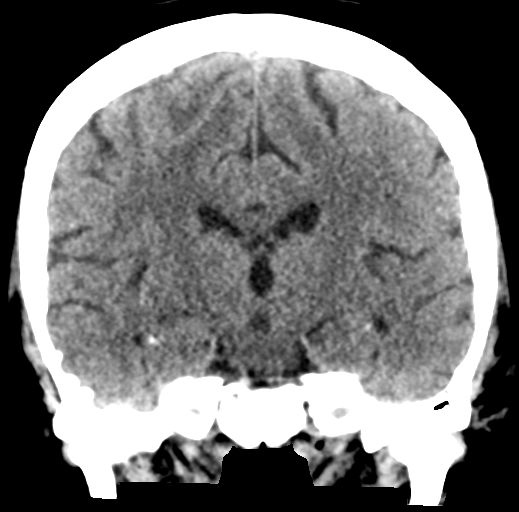
[im 35/63  brain]
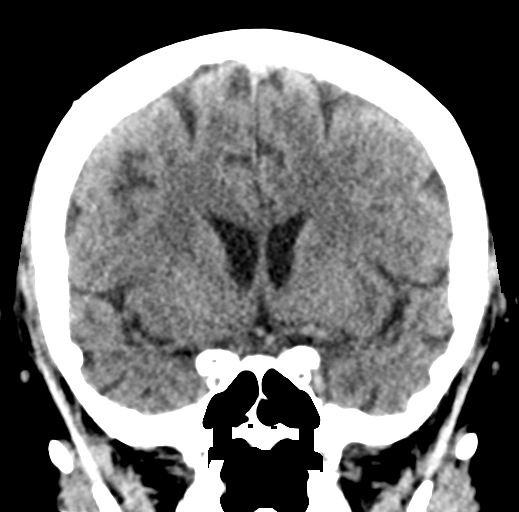

[Series 6: head without sag · sagittal · non-contrast · 0.30mm/px · 3 of 53 slices shown]
[im 18/53  brain]
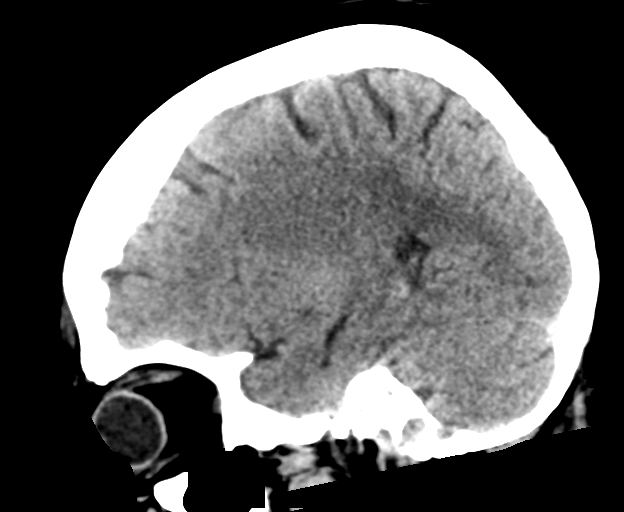
[im 27/53  brain]
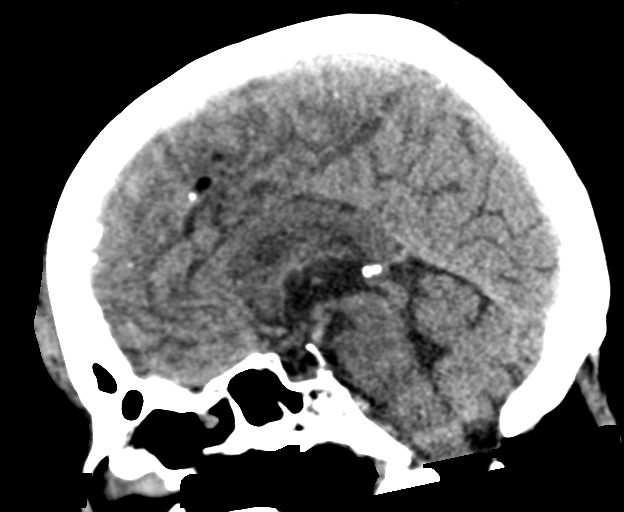
[im 35/53  brain]
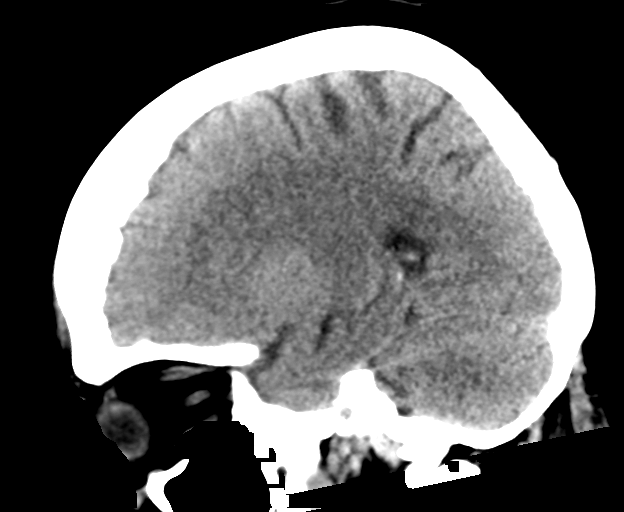

[17 of 47 positions shown; findings below may reference images not displayed]

FINDINGS: Brain: Mild atrophy and mild chronic microvascular ischemic change
in the white matter.

Negative for acute infarct, hemorrhage, or mass.

Vascular: Atherosclerotic calcification. Negative for hyperdense
vessel

Skull: Negative for fracture.  Anterior scalp hematoma.

Sinuses/Orbits: Negative

Other: None
IMPRESSION: Atrophy and chronic microvascular ischemia.  No acute abnormality.

Frontal scalp hematoma.

## 2018-06-05 MED ORDER — TETANUS-DIPHTH-ACELL PERTUSSIS 5-2.5-18.5 LF-MCG/0.5 IM SUSP
0.5000 mL | Freq: Once | INTRAMUSCULAR | Status: AC
  Administered 2018-06-05: 0.5 mL via INTRAMUSCULAR
  Filled 2018-06-05: qty 0.5

## 2018-06-05 MED ORDER — METOPROLOL SUCCINATE ER 25 MG PO TB24
25.0000 mg | ORAL_TABLET | Freq: Two times a day (BID) | ORAL | Status: DC
  Administered 2018-06-05: 25 mg via ORAL
  Filled 2018-06-05 (×2): qty 1

## 2018-06-05 NOTE — ED Provider Notes (Signed)
[facility] EMERGENCY DEPARTMENT Provider Note   CSN: [id number] Arrival date & time: 06/05/18  1059     History   Chief Complaint Chief Complaint  Patient presents with  . Fall    HPI Tina Hester is a 77 y.o. female.  HPI   77 year old female presenting after fall.  Mechanical.  She tripped and lost her balance and fell forward striking her head.  No loss of consciousness.  She is on Eliquis for history of atrial fibrillation.  She also complains some mild left knee pain.  She has been amatory since the fall.  She has a large frontal hematoma and has some localized pain there.  She denies any similar pain elsewhere.  Past Medical History:  Diagnosis Date  . Anemia    s/p Heme work -up normal EGD and colonscopy in 2012 per pt,neg SPEP  . Anxiety   . Aortic stenosis    very mild by echo 05/2017  . Bilateral carotid artery stenosis 06/08/2015   1-39% bilateral  . Bradycardia   . CKD (chronic kidney disease), stage III ([place])   . Diabetes mellitus without complication ([place])    type 2  . GERD (gastroesophageal reflux disease)   . Gout   . HSV-1 (herpes simplex virus 1) infection    Acyclovir prn  . Hyperkalemia   . Hyperlipidemia   . Hypertension   . Joint pain    osteoarthritis by Xray- possible erosion of R 4th MCP,elevated uric  acid ,ANA +   . Morbid obesity ([place])   . OSA on CPAP   . Osteopenia   . PAF (paroxysmal atrial fibrillation) ([place])   . Pain management    Neurosurg: Dr Clydell Hakim  . Pulmonary HTN ([place])    Moderate by echo  2016. PASP 23mmHg  . TIA (transient ischemic attack)    remote history of TIA's early 2000    Patient Active Problem List   Diagnosis Date Noted  . Postural dizziness with presyncope 12/13/2017  . Chest pain 12/13/2017  . Hyperkalemia 12/13/2017  . Acute kidney injury ([place]) 12/13/2017  . Fluid overload 12/13/2017  . Bradycardia 12/13/2017  . CHF (congestive heart failure) ([place]) 12/13/2017  . Aortic  stenosis 11/02/2016  . Abrasion of right cornea 04/12/2016  . Pulmonary HTN ([place]) 12/15/2015  . Bilateral carotid artery stenosis 06/08/2015  . Hypertension   . OSA on CPAP   . Persistent atrial fibrillation ([place])   . Morbid obesity ([place]) 09/04/2013  . Gout 09/04/2013  . DM2 (diabetes mellitus, type 2) ([place]) 05/29/2013  . Hyperlipidemia 05/29/2013  . Anxiety 01/31/2012  . CKD (chronic kidney disease), stage III ([place]) 01/31/2012  . Diabetes mellitus type 2 with complications, uncontrolled ([place]) 01/31/2012  . Hypothyroidism 01/31/2012  . TIA (transient ischemic attack) 01/31/2012    Past Surgical History:  Procedure Laterality Date  . ABDOMINAL HYSTERECTOMY    . APPENDECTOMY    . BACK SURGERY     cervical fusion  . BLADDER SURGERY    . CESAREAN SECTION    . CHOLECYSTECTOMY       OB History   None      Home Medications    Prior to Admission medications   Medication Sig Start Date End Date Taking? Authorizing Provider  acetaminophen (TYLENOL) 650 MG CR tablet Take 1,300 mg by mouth every 8 (eight) hours as needed for pain.   Yes [provider]  acyclovir (ZOVIRAX) 400 MG tablet Take 400 mg by mouth  2 (two) times daily.  09/03/15  Yes [provider]  ALPRAZolam Duanne Moron) 0.25 MG tablet Take 0.25 mg by mouth at bedtime as needed for anxiety.   Yes [provider]  amLODipine (NORVASC) 5 MG tablet Take 1 tablet (5 mg total) by mouth daily. 05/25/18 05/25/19 Yes Turner, Eber Hong, MD  atorvastatin (LIPITOR) 40 MG tablet Take 1 tablet (40 mg total) by mouth daily at 6 PM. 12/13/17  Yes Turner, Traci R, MD  CALCIUM PO Take 1 tablet by mouth daily.    Yes [provider]  COLCRYS 0.6 MG tablet Take 0.6 mg by mouth daily as needed (gout).  05/22/13  Yes [provider]  ELIQUIS 5 MG TABS tablet TAKE 1 TABLET(5 MG) BY MOUTH TWICE DAILY Patient taking differently: Take 5 mg by mouth 2 (two) times daily.  05/12/18  Yes Turner, Eber Hong, MD    Ergocalciferol (VITAMIN D2) 2000 units TABS Take 2,000 Units by mouth daily.    Yes [provider]  gabapentin (NEURONTIN) 100 MG capsule Take 100-200 mg by mouth at bedtime.    Yes [provider]  hydrochlorothiazide (HYDRODIURIL) 25 MG tablet Take 1 tablet (25 mg total) by mouth daily. 02/02/18  Yes Dunn, Dayna N, PA-C  hydrocortisone cream 1 % Apply 1 application topically daily as needed for itching.   Yes [provider]  insulin NPH Human (HUMULIN N) 100 UNIT/ML injection Inject 25 Units into the skin 2 (two) times daily.    Yes [provider]  insulin regular (NOVOLIN R RELION) 100 units/mL injection Inject 0.06-0.1 mLs (6-10 Units total) into the skin 3 (three) times daily before meals. Patient taking differently: Inject 6-10 Units into the skin 2 (two) times daily as needed (only when eating a big meal).  04/21/15  Yes Elayne Snare, MD  levothyroxine (SYNTHROID, LEVOTHROID) 150 MCG tablet Take 1 tablet (150 mcg total) by mouth daily before breakfast. 12/11/17  Yes Elayne Snare, MD  magnesium 30 MG tablet Take 30 mg by mouth at bedtime.    Yes [provider]  metFORMIN (GLUCOPHAGE) 500 MG tablet TAKE 1 TABLET BY MOUTH EVERY MORNING AND 2 TABLETS BY MOUTH EVERY EVENING Patient taking differently: Take 500-1,000 mg by mouth See admin instructions. TAKE 1 TABLET BY MOUTH EVERY MORNING AND 2 TABLETS BY MOUTH EVERY EVENING 05/23/18  Yes Elayne Snare, MD  metoprolol succinate (TOPROL-XL) 25 MG 24 hr tablet Take 25 mg by mouth 2 (two) times daily.   Yes [provider]  pioglitazone (ACTOS) 15 MG tablet TAKE 1 TABLET BY MOUTH EVERY DAY Patient taking differently: Take 15 mg by mouth daily.  05/25/18  Yes Elayne Snare, MD  prednisoLONE acetate (PRED FORTE) 1 % ophthalmic suspension Place 2 drops into the right eye daily. 03/23/17  Yes [provider]  timolol (TIMOPTIC) 0.5 % ophthalmic solution Place 1 drop into both eyes daily.  10/21/15  Yes  [provider]  vitamin B-12 (CYANOCOBALAMIN) 1000 MCG tablet Take 1,000 mcg by mouth daily.   Yes [provider]  vitamin C (ASCORBIC ACID) 500 MG tablet Take 500 mg by mouth daily.   Yes [provider]  glucose blood (FREESTYLE LITE) test strip Use to test blood sugar twice daily. Dx: E11.9 04/18/18   Elayne Snare, MD  Insulin Syringe-Needle U-100 (B-D INS SYR ULTRAFINE 1CC/31G) 31G X 5/16" 1 ML MISC Use 3 times daily to inject insulin 05/23/18   Elayne Snare, MD  NEEDLE, DISP, 30 G (  BD DISP NEEDLES) 30G X 1/2" MISC 1 each by Does not apply route 2 (two) times daily before a meal. 07/11/14   Elayne Snare, MD  [facility] DELICA LANCETS 78E MISC Use to check blood sugars once daily 10/08/13   Elayne Snare, MD    Family History Family History  Problem Relation Age of Onset  . Arrhythmia Brother     Social History Social History   Tobacco Use  . Smoking status: Never Smoker  . Smokeless tobacco: Never Used  Substance Use Topics  . Alcohol use: No  . Drug use: No     Allergies   Pneumococcal vaccine; Pneumovax 23 [pneumococcal vac polyvalent]; Other; Sulfa antibiotics; and Lisinopril   Review of Systems Review of Systems  All systems reviewed and negative, other than as noted in HPI.  Physical Exam Updated Vital Signs BP (!) 157/89   Pulse (!) 109   Temp 98 F (36.7 C) (Oral)   Ht 5\' 1"  (1.549 m)   Wt 111.1 kg   SpO2 97%   BMI 46.29 kg/m   Physical Exam  Constitutional: She is oriented to person, place, and time. She appears well-developed and well-nourished. No distress.  HENT:  Head: Normocephalic.  Large frontal hematoma with a small overlying abrasion.  No significant bleeding.  Eyes: Conjunctivae are normal. Right eye exhibits no discharge. Left eye exhibits no discharge.  Neck: Neck supple.  Cardiovascular: Normal rate, regular rhythm and normal heart sounds. Exam reveals no gallop and no friction rub.  No murmur heard. Pulmonary/Chest:  Effort normal and breath sounds normal. No respiratory distress.  Abdominal: Soft. She exhibits no distension. There is no tenderness.  Musculoskeletal: She exhibits no edema or tenderness.  No midline spinal tenderness.  Some pain ecchymosis mild tenderness to palpation left knee.  She is able to actively range it without apparent discomfort.  Neurological: She is alert and oriented to person, place, and time. No cranial nerve deficit. She exhibits normal muscle tone. Coordination normal.  Skin: Skin is warm and dry.  Psychiatric: She has a normal mood and affect. Her behavior is normal. Thought content normal.  Nursing note and vitals reviewed.    ED Treatments / Results  Labs (all labs ordered are listed, but only abnormal results are displayed) Labs Reviewed - No data to display  EKG EKG Interpretation  Date/Time:  Monday June 05 2018 11:22:28 EDT Ventricular Rate:  122 PR Interval:    QRS Duration: 108 QT Interval:  348 QTC Calculation: 496 R Axis:   -50 Text Interpretation:  Atrial fibrillation Incomplete left bundle branch block Low voltage, precordial leads Consider anterior infarct Baseline wander in lead(s) V3 Confirmed by Virgel Manifold [phone]) on 06/05/2018 12:42:41 PM   Radiology Ct Head Wo Contrast  Result Date: 06/05/2018 CLINICAL DATA:  Fall, head injury.  On Eliquis.  Headache. EXAM: CT HEAD WITHOUT CONTRAST TECHNIQUE: Contiguous axial images were obtained from the base of the skull through the vertex without intravenous contrast. COMPARISON:  None. FINDINGS: Brain: Mild atrophy and mild chronic microvascular ischemic change in the white matter. Negative for acute infarct, hemorrhage, or mass. Vascular: Atherosclerotic calcification. Negative for hyperdense vessel Skull: Negative for fracture.  Anterior scalp hematoma. Sinuses/Orbits: Negative Other: None IMPRESSION: Atrophy and chronic microvascular ischemia.  No acute abnormality. Frontal scalp hematoma.  Electronically Signed   By: Franchot Gallo M.D.   On: 06/05/2018 12:54    Procedures Procedures (including critical care time)  Medications Ordered in ED Medications  metoprolol succinate (  TOPROL-XL) 24 hr tablet 25 mg (25 mg Oral Given 06/05/18 1201)  Tdap (BOOSTRIX) injection 0.5 mL (0.5 mLs Intramuscular Given 06/05/18 1141)     Initial Impression / Assessment and Plan / ED Course  I have reviewed the triage vital signs and the nursing notes.  Pertinent labs & imaging results that were available during my care of the patient were reviewed by me and considered in my medical decision making (see chart for details).     77 year old female with head injury on Eliquis.  Neuro exam nonfocal.  Negative imaging.  Noted to have a rapid heart rate.  History of A. fib.  She did not take her morning medications prior to come to the emergency room.  She was ordered home metoprolol with some improvement.  She has no acute complaints with regards to this.  Continue medications as prescribed.  Return precautions were discussed.   Final Clinical Impressions(s) / ED Diagnoses   Final diagnoses:  Injury of head, initial encounter  Hematoma of scalp, initial encounter    ED Discharge Orders    None       Virgel Manifold, MD 06/05/18 1334

## 2018-06-05 NOTE — ED Triage Notes (Signed)
Per EMS pt was dropping off her granddaughter at school and tripped in the hallway and hit her head.  Pt is taking Eliquis.  She is AOx4 denies LOC Report pain 6/10 in head where she has a large hematoma to anterior head with laceration.  NAD no at this time.

## 2018-06-08 ENCOUNTER — Other Ambulatory Visit: Payer: Self-pay

## 2018-06-08 DIAGNOSIS — R2689 Other abnormalities of gait and mobility: Secondary | ICD-10-CM | POA: Diagnosis not present

## 2018-06-08 DIAGNOSIS — W19XXXD Unspecified fall, subsequent encounter: Secondary | ICD-10-CM | POA: Diagnosis not present

## 2018-06-08 DIAGNOSIS — S0083XD Contusion of other part of head, subsequent encounter: Secondary | ICD-10-CM | POA: Diagnosis not present

## 2018-06-08 NOTE — Patient Outreach (Signed)
[facility]) Care Management  06/08/2018  Tina Hester December 02, 1940 [id number]   Referral Date: 06-08-18 Referral Source: [facility] Referral Reason: Co-pay assistance with Eliquis and needs walker   Outreach Attempt: spoke with patient. She is able to verify HIPAA.  Discussed reason for referral. She states that her Eliquis went up to 86.00 per month and she is having problems paying for it.   Discussed coverage gap with patient.  Patient also reports needing a walker but has an appointment with her physician today and will discuss then.    Patient lives in the home with her daughter.  Patient reports that she is independent with all aspects of care and still drives.  Patient recently had a fall for which she states she is recovering.  She states she is normally ambulates fine but tripped.  Patient admits to history of HF, DM, HTN, and Hyperlipidemia.  Patient reports that she manages with her health issues fine and declines wanting nurse calls/visits at this time.   Discussed other [facility] services.  Patient in agreement for pharmacy only at this time.     Plan: RN CM will refer patient to pharmacy and sign off case.   Jone Baseman, RN, MSN [facility] Care Management Care Management Coordinator Direct Line [phone] Toll Free: [phone]  Fax: [phone]

## 2018-06-09 ENCOUNTER — Other Ambulatory Visit: Payer: Self-pay

## 2018-06-09 NOTE — Patient Outreach (Addendum)
[facility]) Care Management  [place]   06/09/2018  EILENE Hester 11-Apr-1941 [id number]  Successful outreach to Ms. Buena Irish. HIPAA identifiers verified.   Reason for referral:medication assitance  Referral medication(s): Eliquis Current insurance:HTA  Currently receiving Extra Help:  []  Yes [x]  No []  Unknown  PMHx: hypertension, atrial fibrillation, congestive heart failure, type 2 diabetes mellitus, CKD Stage 3, hypothyroidism and hyperlipidemia.  HPI:  Patient reports that she is in the coverage gap.   She states that she buys Relion brand insulin at [facility] and that she received five weeks on Eliquis samples yesterday.  She reports her doctor said she could give her Elquis samples through the end of the year.  She states that she thinks "I'll be okay until I January, when I come out of the donut hole."  Patient states that she lives with her daughter.  Objective: Allergies  Allergen Reactions  . Pneumococcal Vaccine Anaphylaxis    weakness  . Pneumovax 23 [Pneumococcal Vac Polyvalent] Anaphylaxis    weakness  . Other Hives    Other reaction(s): Other (See Comments) Pneumonia  Vaccine- very ill  . Sulfa Antibiotics Hives    Medications Reviewed Today    Reviewed by Serita Butcher, CPhT (Pharmacy Technician) on 06/05/18 at 1212  Med List Status: Complete  Medication Order Taking? Sig Documenting Provider Last Dose Status Informant  acetaminophen (TYLENOL) 650 MG CR tablet [id number] Yes Take 1,300 mg by mouth every 8 (eight) hours as needed for pain. [provider] Past Week Unknown time Active Self  acyclovir (ZOVIRAX) 400 MG tablet [id number] Yes Take 400 mg by mouth 2 (two) times daily.  [provider] 06/04/2018 Unknown time Active Self           Med Note Olena Heckle, MICHELE   Fri Jun 25, 2016 12:32 PM)    ALPRAZolam Duanne Moron) 0.25 MG tablet [id number] Yes Take 0.25 mg by mouth at bedtime as needed for anxiety.  [provider] 06/04/2018 Unknown time Active Self  amLODipine (NORVASC) 5 MG tablet [id number] Yes Take 1 tablet (5 mg total) by mouth daily. Sueanne Margarita, MD 06/04/2018 Unknown time Active Self  atorvastatin (LIPITOR) 40 MG tablet [id number] Yes Take 1 tablet (40 mg total) by mouth daily at 6 PM. Sueanne Margarita, MD 06/04/2018 Unknown time Active Self  CALCIUM PO [id number] Yes Take 1 tablet by mouth daily.  [provider] 06/04/2018 Unknown time Active Self           Med Note Olena Heckle, MICHELE   Fri Jun 25, 2016 12:32 PM)    COLCRYS 0.6 MG tablet [id number] Yes Take 0.6 mg by mouth daily as needed (gout).  [provider] unk Active Self  ELIQUIS 5 MG TABS tablet [id number] Yes TAKE 1 TABLET(5 MG) BY MOUTH TWICE DAILY  Patient taking differently:  Take 5 mg by mouth 2 (two) times daily.    Sueanne Margarita, MD 06/04/2018 2300 Active Self  Ergocalciferol (VITAMIN D2) 2000 units TABS [id number] Yes Take 2,000 Units by mouth daily.  [provider] 06/04/2018 Unknown time Active Self  gabapentin (NEURONTIN) 100 MG capsule [id number] Yes Take 100-200 mg by mouth at bedtime.  [provider] 06/04/2018 Unknown time Active Self  glucose blood (FREESTYLE LITE) test strip [id number]  Use to test blood sugar twice daily. Dx: E11.9 Tina Snare, MD  Active Self  hydrochlorothiazide (HYDRODIURIL) 25 MG tablet [id number] Yes Take 1 tablet (25 mg total) by mouth daily. Charlie Pitter,  PA-C 06/04/2018 Unknown time Active Self  hydrocortisone cream 1 % [id number] Yes Apply 1 application topically daily as needed for itching. [provider] Past Week Unknown time Active Self  insulin NPH Human (HUMULIN N) 100 UNIT/ML injection [id number] Yes Inject 25 Units into the skin 2 (two) times daily.  [provider] 06/04/2018 Unknown time Active Self  insulin regular (NOVOLIN R RELION) 100 units/mL injection [id number] Yes Inject 0.06-0.1 mLs (6-10 Units total) into the skin 3  (three) times daily before meals.  Patient taking differently:  Inject 6-10 Units into the skin 2 (two) times daily as needed (only when eating a big meal).    Tina Snare, MD 06/04/2018 Unknown time Active Self  Insulin Syringe-Needle U-100 (B-D INS SYR ULTRAFINE 1CC/31G) 31G X 5/16" 1 ML MISC [id number]  Use 3 times daily to inject insulin Tina Snare, MD  Active Self  levothyroxine (SYNTHROID, LEVOTHROID) 150 MCG tablet [id number] Yes Take 1 tablet (150 mcg total) by mouth daily before breakfast. Tina Snare, MD 06/04/2018 Unknown time Active Self  magnesium 30 MG tablet [id number] Yes Take 30 mg by mouth at bedtime.  [provider] 06/04/2018 Unknown time Active Self  metFORMIN (GLUCOPHAGE) 500 MG tablet [id number] Yes TAKE 1 TABLET BY MOUTH EVERY MORNING AND 2 TABLETS BY MOUTH EVERY EVENING  Patient taking differently:  Take 500-1,000 mg by mouth See admin instructions. TAKE 1 TABLET BY MOUTH EVERY MORNING AND 2 TABLETS BY MOUTH EVERY Sharol Given, MD 06/04/2018 Unknown time Active Self  metoprolol succinate (TOPROL-XL) 25 MG 24 hr tablet [id number] Yes Take 25 mg by mouth 2 (two) times daily. [provider] 06/04/2018 2300 Active Self  NEEDLE, DISP, 30 G (BD DISP NEEDLES) 30G X 1/2" MISC [id number]  1 each by Does not apply route 2 (two) times daily before a meal. Tina Snare, MD  Active Self  [facility] LANCETS 18A [place] [id number]  Use to check blood sugars once daily Tina Snare, MD  Active Self  pioglitazone (ACTOS) 15 MG tablet [id number] Yes TAKE 1 TABLET BY MOUTH EVERY DAY  Patient taking differently:  Take 15 mg by mouth daily.    Tina Snare, MD 06/04/2018 Unknown time Active Self  prednisoLONE acetate (PRED FORTE) 1 % ophthalmic suspension [id number] Yes Place 2 drops into the right eye daily. [provider] 06/04/2018 Unknown time Active Self  timolol (TIMOPTIC) 0.5 % ophthalmic solution [id number] Yes Place 1 drop into both eyes daily.  [provider] 06/04/2018 Unknown time Active Self           Med Note Olena Heckle, [facility]   Fri Jun 25, 2016 12:33 PM)    vitamin B-12 (CYANOCOBALAMIN) 1000 MCG tablet [id number] Yes Take 1,000 mcg by mouth daily. [provider] 06/04/2018 Unknown time Active Self  vitamin C (ASCORBIC ACID) 500 MG tablet [id number] Yes Take 500 mg by mouth daily. [provider] 06/04/2018 Unknown time Active Self          Assessment:  Drugs sorted by system:  Neurologic/Psychologic: alprazolam, gabapentin  Cardiovascular: amlodipine, atorvastatin, apixaban, hydrochlorothiazide, metoprolol succinate   Endocrine: insulin R and NPH, levothyroxine, pioglitazone   Topical: hydrocortisone cream, timolol opth.  Pain: acetaminophen  Infectious Diseases: acyclovir  Vitamins/Minerals/Supplements: cholecalciferol, vitamin B-12, vitamin C  Miscellaneous: colchicine   Medication Review Findings:  . Statin and colchicine co-administered may have increased risk of myopathy. . Per the [place], alprazolam may have decreased metabolism and increases sensitivity in older adults.  Risk  of cognitive impairment, delirium, falls, fractures and motor vehicle accidents may occur. There is strong evidence to avoid use in the elderly.  Medication Assistance Findings:  Extra Help:   []  Already receiving Full Extra Help  []  Already receiving Partial Extra Help  []  Eligible based on reported income and assets  [x]  Not Eligible based on reported income and assets  Patient Assistance Programs: Patient does not want to proceed with application process at this time since she has received the samples and assistance would only last through 08/29/18.  Encouraged her to call me in 2020 once she has spent 3% of her income out of pocket on prescriptions (~$525) and we can start the Eliquis application process.  She verbalized understanding.   Plan: Close [place] case and route case closure letter to PCP,  Dr. Stephanie Acre.   Joetta Manners, PharmD Clinical Pharmacist [place] [phone]

## 2018-06-14 DIAGNOSIS — Z947 Corneal transplant status: Secondary | ICD-10-CM | POA: Diagnosis not present

## 2018-06-14 DIAGNOSIS — B0052 Herpesviral keratitis: Secondary | ICD-10-CM | POA: Diagnosis not present

## 2018-06-14 DIAGNOSIS — Z9841 Cataract extraction status, right eye: Secondary | ICD-10-CM | POA: Diagnosis not present

## 2018-06-14 DIAGNOSIS — H02889 Meibomian gland dysfunction of unspecified eye, unspecified eyelid: Secondary | ICD-10-CM | POA: Diagnosis not present

## 2018-06-14 DIAGNOSIS — H40059 Ocular hypertension, unspecified eye: Secondary | ICD-10-CM | POA: Diagnosis not present

## 2018-06-14 DIAGNOSIS — Z961 Presence of intraocular lens: Secondary | ICD-10-CM | POA: Diagnosis not present

## 2018-06-14 DIAGNOSIS — H25812 Combined forms of age-related cataract, left eye: Secondary | ICD-10-CM | POA: Diagnosis not present

## 2018-06-15 ENCOUNTER — Ambulatory Visit (HOSPITAL_COMMUNITY)
Admission: RE | Admit: 2018-06-15 | Discharge: 2018-06-15 | Disposition: A | Discharge status: Home / Self Care | Payer: PPO | Source: Ambulatory Visit | Attending: Nurse Practitioner | Admitting: Nurse Practitioner

## 2018-06-15 ENCOUNTER — Encounter (HOSPITAL_COMMUNITY): Payer: Self-pay | Admitting: Nurse Practitioner

## 2018-06-15 VITALS — BP 140/68 | HR 103 | Ht 61.0 in | Wt 246.0 lb

## 2018-06-15 DIAGNOSIS — Z882 Allergy status to sulfonamides status: Secondary | ICD-10-CM | POA: Diagnosis not present

## 2018-06-15 DIAGNOSIS — Z79899 Other long term (current) drug therapy: Secondary | ICD-10-CM | POA: Insufficient documentation

## 2018-06-15 DIAGNOSIS — Z9049 Acquired absence of other specified parts of digestive tract: Secondary | ICD-10-CM | POA: Insufficient documentation

## 2018-06-15 DIAGNOSIS — I6523 Occlusion and stenosis of bilateral carotid arteries: Secondary | ICD-10-CM | POA: Diagnosis not present

## 2018-06-15 DIAGNOSIS — I129 Hypertensive chronic kidney disease with stage 1 through stage 4 chronic kidney disease, or unspecified chronic kidney disease: Secondary | ICD-10-CM | POA: Diagnosis not present

## 2018-06-15 DIAGNOSIS — M8580 Other specified disorders of bone density and structure, unspecified site: Secondary | ICD-10-CM | POA: Diagnosis not present

## 2018-06-15 DIAGNOSIS — I350 Nonrheumatic aortic (valve) stenosis: Secondary | ICD-10-CM | POA: Diagnosis not present

## 2018-06-15 DIAGNOSIS — E785 Hyperlipidemia, unspecified: Secondary | ICD-10-CM | POA: Diagnosis not present

## 2018-06-15 DIAGNOSIS — Z794 Long term (current) use of insulin: Secondary | ICD-10-CM | POA: Diagnosis not present

## 2018-06-15 DIAGNOSIS — I480 Paroxysmal atrial fibrillation: Secondary | ICD-10-CM | POA: Insufficient documentation

## 2018-06-15 DIAGNOSIS — K219 Gastro-esophageal reflux disease without esophagitis: Secondary | ICD-10-CM | POA: Diagnosis not present

## 2018-06-15 DIAGNOSIS — F419 Anxiety disorder, unspecified: Secondary | ICD-10-CM | POA: Diagnosis not present

## 2018-06-15 DIAGNOSIS — N183 Chronic kidney disease, stage 3 (moderate): Secondary | ICD-10-CM | POA: Diagnosis not present

## 2018-06-15 DIAGNOSIS — I4819 Other persistent atrial fibrillation: Secondary | ICD-10-CM

## 2018-06-15 DIAGNOSIS — Z79890 Hormone replacement therapy (postmenopausal): Secondary | ICD-10-CM | POA: Diagnosis not present

## 2018-06-15 DIAGNOSIS — Z8673 Personal history of transient ischemic attack (TIA), and cerebral infarction without residual deficits: Secondary | ICD-10-CM | POA: Insufficient documentation

## 2018-06-15 DIAGNOSIS — M109 Gout, unspecified: Secondary | ICD-10-CM | POA: Insufficient documentation

## 2018-06-15 DIAGNOSIS — G4733 Obstructive sleep apnea (adult) (pediatric): Secondary | ICD-10-CM | POA: Diagnosis not present

## 2018-06-15 DIAGNOSIS — I4891 Unspecified atrial fibrillation: Secondary | ICD-10-CM | POA: Diagnosis present

## 2018-06-15 DIAGNOSIS — Z7901 Long term (current) use of anticoagulants: Secondary | ICD-10-CM | POA: Diagnosis not present

## 2018-06-15 DIAGNOSIS — E1122 Type 2 diabetes mellitus with diabetic chronic kidney disease: Secondary | ICD-10-CM | POA: Insufficient documentation

## 2018-06-15 NOTE — Progress Notes (Signed)
Primary Care Physician: Jonathon Jordan, MD Referring Physician: Marinus Maw, PA Cardiologist: Dr. Nicholes Mango is a 77 y.o. female with a h/o paroxysmal afib that was initially treated with BB'S, maintaining SR. She presented to the ER in April, 2019, for feeling poorly and was found to have brady in the 30's and 40's. Her K+ was also elevated at 6.9 and normalized to 4.8. EP was consulted, seen by Dr. Curt Bears and recommended stopping BB, starting multaq and setting up for event monitor. Event monitor on Multaq showed SR with intermittent afib, as read by Dr. Curt Bears.. She was advised that if afib was bothering her she was to make an appointment with Dr. Curt Bears to further discuss but it does not look at though she followed up with him. She did see Dr. Radford Pax recently and was in afib and referred back here. Pt did recently have a mechanical fall with trauma to the face, checked out in ER and d/c. She continues to have bruising  around the eye area. She is in afib at 103 bpm. Hard to really say how symptomatic she is with afib as she is sedentary but admits to being tired all the time.   Today, she denies symptoms of palpitations, chest pain, shortness of breath, orthopnea, PND, lower extremity edema, dizziness, presyncope, syncope, or neurologic sequela. The patient is tolerating medications without difficulties and is otherwise without complaint today.   Past Medical History:  Diagnosis Date  . Anemia    s/p Heme work -up normal EGD and colonscopy in 2012 per pt,neg SPEP  . Anxiety   . Aortic stenosis    very mild by echo 05/2017  . Bilateral carotid artery stenosis 06/08/2015   1-39% bilateral  . Bradycardia   . CKD (chronic kidney disease), stage III ([place])   . Diabetes mellitus without complication ([place])    type 2  . GERD (gastroesophageal reflux disease)   . Gout   . HSV-1 (herpes simplex virus 1) infection    Acyclovir prn  . Hyperkalemia   . Hyperlipidemia   .  Hypertension   . Joint pain    osteoarthritis by Xray- possible erosion of R 4th MCP,elevated uric  acid ,ANA +   . Morbid obesity ([place])   . OSA on CPAP   . Osteopenia   . PAF (paroxysmal atrial fibrillation) ([place])   . Pain management    Neurosurg: Dr Clydell Hakim  . Pulmonary HTN ([place])    Moderate by echo  2016. PASP 87mmHg  . TIA (transient ischemic attack)    remote history of TIA's early 2000   Past Surgical History:  Procedure Laterality Date  . ABDOMINAL HYSTERECTOMY    . APPENDECTOMY    . BACK SURGERY     cervical fusion  . BLADDER SURGERY    . CESAREAN SECTION    . CHOLECYSTECTOMY      Current Outpatient Medications  Medication Sig Dispense Refill  . acetaminophen (TYLENOL) 325 MG tablet Take by mouth every 6 (six) hours as needed for mild pain or moderate pain.    Marland Kitchen acyclovir (ZOVIRAX) 400 MG tablet Take 400 mg by mouth 2 (two) times daily.     Marland Kitchen ALPRAZolam (XANAX) 0.25 MG tablet Take 0.25 mg by mouth at bedtime as needed for anxiety.    Marland Kitchen amLODipine (NORVASC) 5 MG tablet Take 1 tablet (5 mg total) by mouth daily. 90 tablet 3  . atorvastatin (LIPITOR) 40 MG tablet Take 1 tablet (  40 mg total) by mouth daily at 6 PM. 90 tablet 2  . CALCIUM PO Take 1 tablet by mouth daily. 600 mg    . Cholecalciferol 2000 units TABS Take 4,000 Units by mouth daily.    Marland Kitchen ELIQUIS 5 MG TABS tablet TAKE 1 TABLET(5 MG) BY MOUTH TWICE DAILY (Patient taking differently: Take 5 mg by mouth 2 (two) times daily. ) 180 tablet 2  . gabapentin (NEURONTIN) 100 MG capsule Take 100-200 mg by mouth at bedtime.     Marland Kitchen glucose blood (FREESTYLE LITE) test strip Use to test blood sugar twice daily. Dx: E11.9 200 each 1  . hydrochlorothiazide (HYDRODIURIL) 25 MG tablet Take 1 tablet (25 mg total) by mouth daily. 90 tablet 1  . hydrocortisone cream 1 % Apply 1 application topically daily as needed for itching.    . insulin NPH Human (HUMULIN N) 100 UNIT/ML injection Inject 25 Units into the skin 2 (two) times  daily.     . insulin regular (NOVOLIN R RELION) 100 units/mL injection Inject 0.06-0.1 mLs (6-10 Units total) into the skin 3 (three) times daily before meals. (Patient taking differently: Inject 6-10 Units into the skin 2 (two) times daily as needed (only when eating a big meal). ) 10 mL 1  . Insulin Syringe-Needle U-100 (B-D INS SYR ULTRAFINE 1CC/31G) 31G X 5/16" 1 ML MISC Use 3 times daily to inject insulin 100 each 0  . levothyroxine (SYNTHROID, LEVOTHROID) 150 MCG tablet Take 1 tablet (150 mcg total) by mouth daily before breakfast. 30 tablet 2  . magnesium 30 MG tablet Take 30 mg by mouth at bedtime.     . metFORMIN (GLUCOPHAGE) 500 MG tablet TAKE 1 TABLET BY MOUTH EVERY MORNING AND 2 TABLETS BY MOUTH EVERY EVENING (Patient taking differently: Take 500-1,000 mg by mouth See admin instructions. TAKE 1 TABLET BY MOUTH EVERY MORNING AND 2 TABLETS BY MOUTH EVERY EVENING) 270 tablet 3  . metoprolol succinate (TOPROL-XL) 25 MG 24 hr tablet Take 25 mg by mouth 2 (two) times daily.    Marland Kitchen NEEDLE, DISP, 30 G (BD DISP NEEDLES) 30G X 1/2" MISC 1 each by Does not apply route 2 (two) times daily before a meal. 100 each 5  . ONETOUCH DELICA LANCETS 54O MISC Use to check blood sugars once daily 50 each 3  . pioglitazone (ACTOS) 15 MG tablet TAKE 1 TABLET BY MOUTH EVERY DAY (Patient taking differently: Take 15 mg by mouth daily. ) 90 tablet 0  . prednisoLONE acetate (PRED FORTE) 1 % ophthalmic suspension Place 2 drops into the right eye 4 (four) times daily.   2  . timolol (TIMOPTIC) 0.5 % ophthalmic solution Place 1 drop into both eyes daily.   1  . vitamin B-12 (CYANOCOBALAMIN) 1000 MCG tablet Take 1,000 mcg by mouth daily.    . vitamin C (ASCORBIC ACID) 500 MG tablet Take 500 mg by mouth daily.    Marland Kitchen COLCRYS 0.6 MG tablet Take 0.6 mg by mouth daily as needed (gout).      No current facility-administered medications for this encounter.     Allergies  Allergen Reactions  . Pneumococcal Vaccine Anaphylaxis      weakness  . Pneumovax 23 [Pneumococcal Vac Polyvalent] Anaphylaxis    weakness  . Other Hives    Other reaction(s): Other (See Comments) Pneumonia  Vaccine- very ill  . Sulfa Antibiotics Hives    Social History   Socioeconomic History  . Marital status: Divorced    Spouse name:  Not on file  . Number of children: Not on file  . Years of education: Not on file  . Highest education level: Not on file  Occupational History  . Not on file  Social Needs  . Financial resource strain: Not on file  . Food insecurity:    Worry: Not on file    Inability: Not on file  . Transportation needs:    Medical: Not on file    Non-medical: Not on file  Tobacco Use  . Smoking status: Never Smoker  . Smokeless tobacco: Never Used  Substance and Sexual Activity  . Alcohol use: No  . Drug use: No  . Sexual activity: Not on file  Lifestyle  . Physical activity:    Days per week: Not on file    Minutes per session: Not on file  . Stress: Not on file  Relationships  . Social connections:    Talks on phone: Not on file    Gets together: Not on file    Attends religious service: Not on file    Active member of club or organization: Not on file    Attends meetings of clubs or organizations: Not on file    Relationship status: Not on file  . Intimate partner violence:    Fear of current or ex partner: Not on file    Emotionally abused: Not on file    Physically abused: Not on file    Forced sexual activity: Not on file  Other Topics Concern  . Not on file  Social History Narrative  . Not on file    Family History  Problem Relation Age of Onset  . Arrhythmia Brother     ROS- All systems are reviewed and negative except as per the HPI above  Physical Exam: Vitals:   06/15/18 1126  BP: 140/68  Pulse: (!) 103  Weight: 111.6 kg  Height: 5\' 1"  (1.549 m)   Wt Readings from Last 3 Encounters:  06/15/18 111.6 kg  06/05/18 111.1 kg  05/25/18 111.3 kg    Labs: Lab Results   Component Value Date   NA 139 05/22/2018   K 4.1 05/22/2018   CL 103 05/22/2018   CO2 28 05/22/2018   GLUCOSE 181 (H) 05/22/2018   BUN 29 (H) 05/22/2018   CREATININE 1.21 (H) 05/22/2018   CALCIUM 9.2 05/22/2018   MG 2.2 02/02/2018   No results found for: INR Lab Results  Component Value Date   CHOL 114 01/10/2018   HDL 31.50 (L) 01/10/2018   LDLCALC 50 01/10/2018   TRIG 163.0 (H) 01/10/2018     GEN- The patient is well appearing, alert and oriented x 3 today.   Head- normocephalic, atraumatic Eyes-  Sclera clear, conjunctiva pink Ears- hearing intact Oropharynx- clear Neck- supple, no JVP Lymph- no cervical lymphadenopathy Lungs- Clear to ausculation bilaterally, normal work of breathing Heart- irregular rate and rhythm, no murmurs, rubs or gallops, PMI not laterally displaced GI- soft, NT, ND, + BS Extremities- no clubbing, cyanosis, or edema MS- no significant deformity or atrophy Skin- no rash or lesion Psych- euthymic mood, full affect Neuro- strength and sensation are intact  EKG- Afib at 103 bpm, qrs int 108 ms, qtc 482 ms    Assessment and Plan: 1. Paroxysmal afib Has failed multaq and is off drug Now with persistent aifb Continue metoprolol succinate 25 mg bid Discussed AAD's with pt , flecainide,Tikosyn, sotalol or  Amiodarone although she has baseline thyroid issues She states that she  really hates to add any new drugs as she is taking so many currently Her best friend just had an ablation and is doing well so she would appreciate the opportunity to discuss an ablation with Dr. Curt Bears Her left atrium was normal size 05/2017 Her weight may contribute to the procedure being less successful as well as many co morbities   I will ask for appointment with  Dr. Curt Bears to further discuss  Geroge Baseman. Jeremie Abdelaziz, [facility] [address] [place], [place] [address] [phone]

## 2018-06-16 DIAGNOSIS — M25562 Pain in left knee: Secondary | ICD-10-CM | POA: Diagnosis not present

## 2018-06-16 DIAGNOSIS — R0781 Pleurodynia: Secondary | ICD-10-CM | POA: Insufficient documentation

## 2018-06-16 DIAGNOSIS — S8002XA Contusion of left knee, initial encounter: Secondary | ICD-10-CM | POA: Diagnosis not present

## 2018-06-21 ENCOUNTER — Telehealth: Payer: Self-pay | Admitting: *Deleted

## 2018-06-21 NOTE — Telephone Encounter (Signed)
Informed patient of compliance results and verbalized understanding was indicated. Patient is aware and agreeable to AHI being within range at 6.0. Patient is aware and agreeable to being in compliance with machine usage Patient is aware and agreeable to no change in current pressures.

## 2018-06-21 NOTE — Telephone Encounter (Signed)
-----   Message from Sueanne Margarita, MD sent at 06/20/2018  1:50 PM EDT ----- Good AHI and compliance.  Continue current PAP settings.

## 2018-07-03 DIAGNOSIS — H40002 Preglaucoma, unspecified, left eye: Secondary | ICD-10-CM | POA: Diagnosis not present

## 2018-07-12 DIAGNOSIS — H40059 Ocular hypertension, unspecified eye: Secondary | ICD-10-CM | POA: Diagnosis not present

## 2018-07-12 DIAGNOSIS — H40002 Preglaucoma, unspecified, left eye: Secondary | ICD-10-CM | POA: Diagnosis not present

## 2018-07-12 DIAGNOSIS — B0052 Herpesviral keratitis: Secondary | ICD-10-CM | POA: Diagnosis not present

## 2018-07-12 DIAGNOSIS — Z961 Presence of intraocular lens: Secondary | ICD-10-CM | POA: Diagnosis not present

## 2018-07-12 DIAGNOSIS — Z9841 Cataract extraction status, right eye: Secondary | ICD-10-CM | POA: Diagnosis not present

## 2018-07-12 DIAGNOSIS — H25812 Combined forms of age-related cataract, left eye: Secondary | ICD-10-CM | POA: Diagnosis not present

## 2018-07-12 DIAGNOSIS — H02889 Meibomian gland dysfunction of unspecified eye, unspecified eyelid: Secondary | ICD-10-CM | POA: Diagnosis not present

## 2018-07-12 DIAGNOSIS — Z947 Corneal transplant status: Secondary | ICD-10-CM | POA: Diagnosis not present

## 2018-07-12 DIAGNOSIS — H4051X4 Glaucoma secondary to other eye disorders, right eye, indeterminate stage: Secondary | ICD-10-CM | POA: Diagnosis not present

## 2018-07-14 ENCOUNTER — Encounter: Payer: Self-pay | Admitting: Cardiology

## 2018-07-14 ENCOUNTER — Other Ambulatory Visit: Payer: Self-pay | Admitting: Cardiology

## 2018-07-26 DIAGNOSIS — H40002 Preglaucoma, unspecified, left eye: Secondary | ICD-10-CM | POA: Diagnosis not present

## 2018-08-14 ENCOUNTER — Ambulatory Visit: Payer: PPO | Admitting: Endocrinology

## 2018-08-19 ENCOUNTER — Other Ambulatory Visit: Payer: Self-pay | Admitting: Endocrinology

## 2018-09-11 ENCOUNTER — Other Ambulatory Visit (INDEPENDENT_AMBULATORY_CARE_PROVIDER_SITE_OTHER): Payer: PPO

## 2018-09-11 DIAGNOSIS — E063 Autoimmune thyroiditis: Secondary | ICD-10-CM

## 2018-09-11 DIAGNOSIS — Z794 Long term (current) use of insulin: Secondary | ICD-10-CM | POA: Diagnosis not present

## 2018-09-11 DIAGNOSIS — E1165 Type 2 diabetes mellitus with hyperglycemia: Secondary | ICD-10-CM

## 2018-09-11 LAB — COMPREHENSIVE METABOLIC PANEL
ALT: 14 U/L (ref 0–35)
AST: 16 U/L (ref 0–37)
Albumin: 3.8 g/dL (ref 3.5–5.2)
Alkaline Phosphatase: 73 U/L (ref 39–117)
BILIRUBIN TOTAL: 0.5 mg/dL (ref 0.2–1.2)
BUN: 25 mg/dL — AB (ref 6–23)
CO2: 27 mEq/L (ref 19–32)
Calcium: 9.8 mg/dL (ref 8.4–10.5)
Chloride: 103 mEq/L (ref 96–112)
Creatinine, Ser: 1.22 mg/dL — ABNORMAL HIGH (ref 0.40–1.20)
GFR: 45.35 mL/min — ABNORMAL LOW (ref >60.00)
Glucose, Bld: 173 mg/dL — ABNORMAL HIGH (ref 70–99)
Potassium: 4.4 mEq/L (ref 3.5–5.1)
Sodium: 139 mEq/L (ref 135–145)
Total Protein: 7.2 g/dL (ref 6.0–8.3)

## 2018-09-11 LAB — TSH: TSH: 5.34 u[IU]/mL — AB (ref 0.35–4.50)

## 2018-09-11 LAB — HEMOGLOBIN A1C: Hgb A1c MFr Bld: 7.8 % — ABNORMAL HIGH (ref 4.6–6.5)

## 2018-09-14 ENCOUNTER — Ambulatory Visit: Payer: PPO | Admitting: Endocrinology

## 2018-09-14 ENCOUNTER — Encounter: Payer: Self-pay | Admitting: Endocrinology

## 2018-09-14 VITALS — BP 130/70 | HR 72 | Ht 61.0 in | Wt 238.0 lb

## 2018-09-14 DIAGNOSIS — E1165 Type 2 diabetes mellitus with hyperglycemia: Secondary | ICD-10-CM

## 2018-09-14 DIAGNOSIS — E063 Autoimmune thyroiditis: Secondary | ICD-10-CM

## 2018-09-14 DIAGNOSIS — Z794 Long term (current) use of insulin: Secondary | ICD-10-CM

## 2018-09-14 NOTE — Patient Instructions (Addendum)
Thyroid 1 day a week take extra 1/2 pill   Check blood sugars on waking up 4 days a week  Also check blood sugars about 2 hours after meals and do this after different meals by rotation  Recommended blood sugar levels on waking up are 90-130 and about 2 hours after meal is 130-160  Please bring your blood sugar monitor to each visit, thank you

## 2018-09-14 NOTE — Progress Notes (Signed)
Patient ID: Tina Hester, female   DOB: Jan 27, 1941, 78 y.o.   MRN: [id number]   Reason for Appointment: Diabetes follow-up   History of Present Illness   Diagnosis: Type 2 DIABETES MELITUS, long-standing    Previous history:  She has been on insulin for a few years. Because of cost she has been switched from Lantus to NPH insulin twice a day She had previously been taking metformin but this was stopped when she had renal insufficiency Overall her blood sugars have been difficult to control and she has previously had relatively poor control. However with adding Actos her blo weight 78 year old closely checking her sugars before eating so that by carbohydrate load after about 2 after lunch or dinner od sugars had started improving significantly Also with her being very consistent with diet in 2015 her A1c had gone down to 6.4 To improve her control she was given a trial of the V-go pump in 2/17 but she had allergic skin reaction with the adhesive  Recent history:    Insulin regimen: Humulin N 25 a.m., 25 HS; R 5 prn  Oral hypoglycemic drugs: Actos 15 mg, Metformin 500 mg in a.m.-1000 mg   Her A1c is slightly better at 7.8 compared to 8.2 09/08/2018 meal last night  Current blood sugar patterns, management and problems identified:   Her blood sugars overall are relatively better but most of her monitoring is in the morning fasting  She does appear to have lost weight and she thinks this is from cutting back on portions and not being as hungry  As before she usually does not take any mealtime insulin unless she is eating a big meal  Also forgets to check her blood sugar, she thinks she is too busy to do this  All the lab glucose was 173 after breakfast she has not had any consistently high readings after meals at home  She is not doing any formal exercise but she thinks she is generally walking throughout the day  Her NPH was changed to 25 units on the last visit  and she was told to take it on waking up  She will eat out fairly regularly but says that she is only trying to eat a small hamburger at [facility] when she goes there  Also no hypoglycemia                 Side effects from medications:She did not tolerate Invokana and was having muscle cramps  Monitors blood glucose: occasionally     Glucometer:  FreeStyle       Blood Glucose readings from  :   PRE-MEAL Fasting Lunch Dinner Bedtime Overall  Glucose range:  109-156   150-179   Mean/median: 132    163 136   POST-MEAL PC Breakfast PC Lunch PC Dinner  Glucose range:  133, 130  133   Mean/median:        Meals: 2-3 meals per day. Bfst 10 Dinner around 7-8 pm  Am: usually egg and toast at 11 am       Physical activity: exercise: None  Wt Readings from Last 3 Encounters:  09/14/18 238 lb (108 kg)  06/15/18 246 lb (111.6 kg)  06/05/18 245 lb (111.1 kg)   LABS:6.7  Lab Results  Component Value Date   HGBA1C 7.8 (H) 09/11/2018   HGBA1C 8.2 (H) 05/22/2018   HGBA1C 7.3 (H) 01/10/2018   Lab Results  Component Value Date   MICROALBUR 6.1 (H) 01/10/2018  LDLCALC 50 01/10/2018   CREATININE 1.22 (H) 09/11/2018   Lab on 09/11/2018  Component Date Value Ref Range Status  . TSH 09/11/2018 5.34* 0.35 - 4.50 uIU/mL Final  . Sodium 09/11/2018 139  135 - 145 mEq/L Final  . Potassium 09/11/2018 4.4  3.5 - 5.1 mEq/L Final  . Chloride 09/11/2018 103  96 - 112 mEq/L Final  . CO2 09/11/2018 27  19 - 32 mEq/L Final  . Glucose, Bld 09/11/2018 173* 70 - 99 mg/dL Final  . BUN 09/11/2018 25* 6 - 23 mg/dL Final  . Creatinine, Ser 09/11/2018 1.22* 0.40 - 1.20 mg/dL Final  . Total Bilirubin 09/11/2018 0.5  0.2 - 1.2 mg/dL Final  . Alkaline Phosphatase 09/11/2018 73  39 - 117 U/L Final  . AST 09/11/2018 16  0 - 37 U/L Final  . ALT 09/11/2018 14  0 - 35 U/L Final  . Total Protein 09/11/2018 7.2  6.0 - 8.3 g/dL Final  . Albumin 09/11/2018 3.8  3.5 - 5.2 g/dL Final  . Calcium 09/11/2018 9.8   8.4 - 10.5 mg/dL Final  . GFR 09/11/2018 45.35* >60.00 mL/min Final  . Hgb A1c MFr Bld 09/11/2018 7.8* 4.6 - 6.5 % Final   Glycemic Control Guidelines for People with Diabetes:Non Diabetic:  <6%Goal of Therapy: <7%Additional Action Suggested:  >8%      Allergies as of 09/14/2018      Reactions   Pneumococcal Vaccine Anaphylaxis   weakness   Pneumovax 23 [pneumococcal Vac Polyvalent] Anaphylaxis   weakness   Other Hives   Other reaction(s): Other (See Comments) Pneumonia  Vaccine- very ill   Sulfa Antibiotics Hives      Medication List       Accurate as of September 14, 2018 12:03 PM. Always use your most recent med list.        acetaminophen 325 MG tablet Commonly known as:  TYLENOL Take by mouth every 6 (six) hours as needed for mild pain or moderate pain.   acyclovir 400 MG tablet Commonly known as:  ZOVIRAX Take 400 mg by mouth 2 (two) times daily.   ALPRAZolam 0.25 MG tablet Commonly known as:  XANAX Take 0.25 mg by mouth at bedtime as needed for anxiety.   amLODipine 5 MG tablet Commonly known as:  NORVASC Take 1 tablet (5 mg total) by mouth daily.   atorvastatin 40 MG tablet Commonly known as:  LIPITOR Take 1 tablet (40 mg total) by mouth daily at 6 PM.   CALCIUM PO Take 1 tablet by mouth daily. 600 mg   Cholecalciferol 50 MCG (2000 UT) Tabs Take 4,000 Units by mouth daily.   COLCRYS 0.6 MG tablet Generic drug:  colchicine Take 0.6 mg by mouth daily as needed (gout).   ELIQUIS 5 MG Tabs tablet Generic drug:  apixaban TAKE 1 TABLET(5 MG) BY MOUTH TWICE DAILY   gabapentin 100 MG capsule Commonly known as:  NEURONTIN Take 100-200 mg by mouth at bedtime.   glucose blood test strip Commonly known as:  FREESTYLE LITE Use to test blood sugar twice daily. Dx: E11.9   hydrochlorothiazide 25 MG tablet Commonly known as:  HYDRODIURIL Take 1 tablet (25 mg total) by mouth daily.   hydrocortisone cream 1 % Apply 1 application topically daily as needed for  itching.   insulin NPH Human 100 UNIT/ML injection Commonly known as:  HUMULIN N,NOVOLIN N Inject 25 Units into the skin 2 (two) times daily before a meal. INJECT 25 UNITS UNDER THE SKIN  TWICE DAILY WITH MEALS.   insulin regular 100 units/mL injection Commonly known as:  NOVOLIN R RELION Inject 0.06-0.1 mLs (6-10 Units total) into the skin 3 (three) times daily before meals.   Insulin Syringe-Needle U-100 31G X 5/16" 1 ML Misc Commonly known as:  B-D INS SYR ULTRAFINE 1CC/31G Use 3 times daily to inject insulin   levothyroxine 150 MCG tablet Commonly known as:  SYNTHROID, LEVOTHROID Take 1 tablet (150 mcg total) by mouth daily before breakfast.   magnesium 30 MG tablet Take 30 mg by mouth at bedtime.   metFORMIN 500 MG tablet Commonly known as:  GLUCOPHAGE TAKE 1 TABLET BY MOUTH EVERY MORNING AND 2 TABLETS BY MOUTH EVERY EVENING   metoprolol succinate 25 MG 24 hr tablet Commonly known as:  TOPROL-XL TAKE 1 TABLET BY MOUTH TWICE DAILY   NEEDLE (DISP) 30 G 30G X 1/2" Misc Commonly known as:  BD DISP NEEDLES 1 each by Does not apply route 2 (two) times daily before a meal.   ONETOUCH DELICA LANCETS 49S Misc Use to check blood sugars once daily   pioglitazone 15 MG tablet Commonly known as:  ACTOS TAKE 1 TABLET BY MOUTH EVERY DAY   prednisoLONE acetate 1 % ophthalmic suspension Commonly known as:  PRED FORTE Place 2 drops into the right eye 4 (four) times daily.   timolol 0.5 % ophthalmic solution Commonly known as:  TIMOPTIC Place 1 drop into both eyes daily.   vitamin B-12 1000 MCG tablet Commonly known as:  CYANOCOBALAMIN Take 1,000 mcg by mouth daily.   vitamin C 500 MG tablet Commonly known as:  ASCORBIC ACID Take 500 mg by mouth daily.       Allergies:  Allergies  Allergen Reactions  . Pneumococcal Vaccine Anaphylaxis    weakness  . Pneumovax 23 [Pneumococcal Vac Polyvalent] Anaphylaxis    weakness  . Other Hives    Other reaction(s): Other (See  Comments) Pneumonia  Vaccine- very ill  . Sulfa Antibiotics Hives    Past Medical History:  Diagnosis Date  . Anemia    s/p Heme work -up normal EGD and colonscopy in 2012 per pt,neg SPEP  . Anxiety   . Aortic stenosis    very mild by echo 05/2017  . Bilateral carotid artery stenosis 06/08/2015   1-39% bilateral  . Bradycardia   . CKD (chronic kidney disease), stage III ([place])   . Diabetes mellitus without complication ([place])    type 2  . GERD (gastroesophageal reflux disease)   . Gout   . HSV-1 (herpes simplex virus 1) infection    Acyclovir prn  . Hyperkalemia   . Hyperlipidemia   . Hypertension   . Joint pain    osteoarthritis by Xray- possible erosion of R 4th MCP,elevated uric  acid ,ANA +   . Morbid obesity ([place])   . OSA on CPAP   . Osteopenia   . PAF (paroxysmal atrial fibrillation) ([place])   . Pain management    Neurosurg: Dr Clydell Hakim  . Pulmonary HTN ([place])    Moderate by echo  2016. PASP 20mmHg  . TIA (transient ischemic attack)    remote history of TIA's early 2000    Past Surgical History:  Procedure Laterality Date  . ABDOMINAL HYSTERECTOMY    . APPENDECTOMY    . BACK SURGERY     cervical fusion  . BLADDER SURGERY    . CESAREAN SECTION    . CHOLECYSTECTOMY      Family History  Problem Relation Age of Onset  . Arrhythmia Brother     Social History:  reports that she has never smoked. She has never used smokeless tobacco. She reports that she does not drink alcohol or use drugs.  Review of Systems:   Hypertension:   This has been managed by her PCP or nephrologist She is taking amlodipine and HCTZ  Lipids: Triglycerides have been high Previously, now controlled off fenofibrate, LDL has been below 100 with Lipitor 40 mg   Lab Results  Component Value Date   CHOL 114 01/10/2018   HDL 31.50 (L) 01/10/2018   LDLCALC 50 01/10/2018   LDLDIRECT 97.2 05/31/2013   TRIG 163.0 (H) 01/10/2018   CHOLHDL 4 01/10/2018    Renal insufficiency: Her  creatinine is upper normal and stable, followed by nephrologist also She is not taking lisinopril because of hyperkalemia previously   Lab Results  Component Value Date   CREATININE 1.22 (H) 09/11/2018    She has hypothyroidism and is taking 150g levothyroxine, last TSH is relatively high despite her taking her supplement regularly before breakfast    Lab Results  Component Value Date   TSH 5.34 (H) 09/11/2018    Taking gabapentin 100 mg as needed for mild burning and tingling      Examination:   BP 130/70 (BP Location: Left Arm, Patient Position: Sitting, Cuff Size: Large)   Pulse 72   Ht 5\' 1"  (1.549 m)   Wt 238 lb (108 kg)   SpO2 97%   BMI 44.97 kg/m   Body mass index is 44.97 kg/m.   No ankle edema present    ASSESSMENT/ PLAN:   Diabetes type 2 with obesity, insulin requiring:    See history of present illness for detailed discussion of  current management, blood sugar patterns and issues identified  Her A1c is still above target but relatively better at 7.9, has been as low as 7.3  Blood sugars reviewed from her home glucose monitor download  She is still  on twice a day NPH insulin along with metformin and low-dose Actos  As discussed above difficult to know if she is having postprandial hyperglycemia at times Diet can be variable with some fast food However she is losing weight now She is fairly compliant with her NPH insulin and currently not clear which she needs regular insulin because of lack of monitoring of postprandial hyperglycemia  For now we will continue her regimen unchanged and will have her try to do more readings after meals to help her decide on whether she needs regular insulin with certain meals   RENAL dysfunction: Stable and mild  HYPOTHYROIDISM: Although she appears asymptomatic her TSH is higher than normal and now on 150 mcg, will increase the dose by half tablet weekly   Mild hypertension: Controlled   Patient  Instructions  Thyroid 1 day a week take extra 1/2 pill   Check blood sugars on waking up 4 days a week  Also check blood sugars about 2 hours after meals and do this after different meals by rotation  Recommended blood sugar levels on waking up are 90-130 and about 2 hours after meal is 130-160  Please bring your blood sugar monitor to each visit, thank you    Total visit time for evaluation and management of multiple problems and counseling =25 minutes   Eulene Pekar 09/14/2018, 12:03 PM

## 2018-09-15 ENCOUNTER — Ambulatory Visit: Payer: PPO | Admitting: Endocrinology

## 2018-09-26 ENCOUNTER — Other Ambulatory Visit: Payer: Self-pay | Admitting: Cardiology

## 2018-09-26 ENCOUNTER — Other Ambulatory Visit: Payer: Self-pay | Admitting: Physician Assistant

## 2018-09-26 DIAGNOSIS — Z9989 Dependence on other enabling machines and devices: Secondary | ICD-10-CM

## 2018-09-26 DIAGNOSIS — I350 Nonrheumatic aortic (valve) stenosis: Secondary | ICD-10-CM

## 2018-09-26 DIAGNOSIS — I6523 Occlusion and stenosis of bilateral carotid arteries: Secondary | ICD-10-CM

## 2018-09-26 DIAGNOSIS — G4733 Obstructive sleep apnea (adult) (pediatric): Secondary | ICD-10-CM

## 2018-09-26 DIAGNOSIS — I10 Essential (primary) hypertension: Secondary | ICD-10-CM

## 2018-09-26 DIAGNOSIS — I480 Paroxysmal atrial fibrillation: Secondary | ICD-10-CM

## 2018-09-26 DIAGNOSIS — I272 Pulmonary hypertension, unspecified: Secondary | ICD-10-CM

## 2018-11-27 ENCOUNTER — Other Ambulatory Visit: Payer: Self-pay | Admitting: Endocrinology

## 2018-12-12 ENCOUNTER — Other Ambulatory Visit: Payer: PPO

## 2018-12-13 ENCOUNTER — Other Ambulatory Visit: Payer: Self-pay

## 2018-12-13 ENCOUNTER — Other Ambulatory Visit (INDEPENDENT_AMBULATORY_CARE_PROVIDER_SITE_OTHER): Payer: PPO

## 2018-12-13 DIAGNOSIS — E1165 Type 2 diabetes mellitus with hyperglycemia: Secondary | ICD-10-CM | POA: Diagnosis not present

## 2018-12-13 DIAGNOSIS — C50919 Malignant neoplasm of unspecified site of unspecified female breast: Secondary | ICD-10-CM | POA: Diagnosis not present

## 2018-12-13 DIAGNOSIS — Z23 Encounter for immunization: Secondary | ICD-10-CM | POA: Diagnosis not present

## 2018-12-13 DIAGNOSIS — Z794 Long term (current) use of insulin: Secondary | ICD-10-CM | POA: Diagnosis not present

## 2018-12-13 DIAGNOSIS — F419 Anxiety disorder, unspecified: Secondary | ICD-10-CM | POA: Diagnosis not present

## 2018-12-13 DIAGNOSIS — E559 Vitamin D deficiency, unspecified: Secondary | ICD-10-CM | POA: Diagnosis not present

## 2018-12-13 DIAGNOSIS — E063 Autoimmune thyroiditis: Secondary | ICD-10-CM

## 2018-12-13 DIAGNOSIS — M25652 Stiffness of left hip, not elsewhere classified: Secondary | ICD-10-CM | POA: Diagnosis not present

## 2018-12-13 DIAGNOSIS — G3184 Mild cognitive impairment, so stated: Secondary | ICD-10-CM | POA: Diagnosis not present

## 2018-12-13 DIAGNOSIS — E785 Hyperlipidemia, unspecified: Secondary | ICD-10-CM | POA: Diagnosis not present

## 2018-12-13 DIAGNOSIS — I2510 Atherosclerotic heart disease of native coronary artery without angina pectoris: Secondary | ICD-10-CM | POA: Diagnosis not present

## 2018-12-13 DIAGNOSIS — G4734 Idiopathic sleep related nonobstructive alveolar hypoventilation: Secondary | ICD-10-CM | POA: Diagnosis not present

## 2018-12-13 DIAGNOSIS — I739 Peripheral vascular disease, unspecified: Secondary | ICD-10-CM | POA: Diagnosis not present

## 2018-12-13 DIAGNOSIS — R0602 Shortness of breath: Secondary | ICD-10-CM | POA: Diagnosis not present

## 2018-12-13 DIAGNOSIS — Z955 Presence of coronary angioplasty implant and graft: Secondary | ICD-10-CM | POA: Diagnosis not present

## 2018-12-13 DIAGNOSIS — H25812 Combined forms of age-related cataract, left eye: Secondary | ICD-10-CM | POA: Diagnosis not present

## 2018-12-13 DIAGNOSIS — Z9889 Other specified postprocedural states: Secondary | ICD-10-CM | POA: Diagnosis not present

## 2018-12-13 DIAGNOSIS — Z1339 Encounter for screening examination for other mental health and behavioral disorders: Secondary | ICD-10-CM | POA: Diagnosis not present

## 2018-12-13 DIAGNOSIS — I10 Essential (primary) hypertension: Secondary | ICD-10-CM | POA: Diagnosis not present

## 2018-12-13 DIAGNOSIS — R011 Cardiac murmur, unspecified: Secondary | ICD-10-CM | POA: Diagnosis not present

## 2018-12-13 DIAGNOSIS — Z0000 Encounter for general adult medical examination without abnormal findings: Secondary | ICD-10-CM | POA: Diagnosis not present

## 2018-12-13 DIAGNOSIS — M25552 Pain in left hip: Secondary | ICD-10-CM | POA: Diagnosis not present

## 2018-12-13 LAB — URINALYSIS, ROUTINE W REFLEX MICROSCOPIC
Bilirubin Urine: NEGATIVE
Hgb urine dipstick: NEGATIVE
Ketones, ur: NEGATIVE
Leukocytes,Ua: NEGATIVE
Nitrite: POSITIVE — AB
RBC / HPF: NONE SEEN (ref 0–?)
Specific Gravity, Urine: 1.015 (ref 1.000–1.030)
Total Protein, Urine: NEGATIVE
Urine Glucose: NEGATIVE
Urobilinogen, UA: 0.2 (ref 0.0–1.0)
pH: 7 (ref 5.0–8.0)

## 2018-12-13 LAB — BASIC METABOLIC PANEL
BUN: 26 mg/dL — ABNORMAL HIGH (ref 6–23)
CO2: 32 mEq/L (ref 19–32)
Calcium: 9.5 mg/dL (ref 8.4–10.5)
Chloride: 100 mEq/L (ref 96–112)
Creatinine, Ser: 1.17 mg/dL (ref 0.40–1.20)
GFR: 44.75 mL/min — ABNORMAL LOW (ref >60.00)
Glucose, Bld: 135 mg/dL — ABNORMAL HIGH (ref 70–99)
Potassium: 4.1 mEq/L (ref 3.5–5.1)
Sodium: 140 mEq/L (ref 135–145)

## 2018-12-13 LAB — T4, FREE: Free T4: 1.15 ng/dL (ref 0.60–1.60)

## 2018-12-13 LAB — MICROALBUMIN / CREATININE URINE RATIO
Creatinine,U: 49.1 mg/dL
Microalb Creat Ratio: 6 mg/g (ref 0.0–30.0)
Microalb, Ur: 2.9 mg/dL — ABNORMAL HIGH (ref 0.0–1.9)

## 2018-12-13 LAB — HEMOGLOBIN A1C: Hgb A1c MFr Bld: 8 % — ABNORMAL HIGH (ref 4.6–6.5)

## 2018-12-13 LAB — TSH: TSH: 1.84 u[IU]/mL (ref 0.35–4.50)

## 2018-12-15 ENCOUNTER — Ambulatory Visit (INDEPENDENT_AMBULATORY_CARE_PROVIDER_SITE_OTHER): Payer: PPO | Admitting: Endocrinology

## 2018-12-15 ENCOUNTER — Other Ambulatory Visit: Payer: Self-pay

## 2018-12-15 ENCOUNTER — Encounter: Payer: Self-pay | Admitting: Endocrinology

## 2018-12-15 DIAGNOSIS — E782 Mixed hyperlipidemia: Secondary | ICD-10-CM

## 2018-12-15 DIAGNOSIS — E063 Autoimmune thyroiditis: Secondary | ICD-10-CM

## 2018-12-15 DIAGNOSIS — E1165 Type 2 diabetes mellitus with hyperglycemia: Secondary | ICD-10-CM

## 2018-12-15 DIAGNOSIS — Z794 Long term (current) use of insulin: Secondary | ICD-10-CM

## 2018-12-15 NOTE — Progress Notes (Addendum)
Patient ID: Tina Hester, female   DOB: [birth year], 78 y.o.   MRN: [id number]   Today's office visit was provided via telemedicine using video technique Explained to the patient and the the limitations of evaluation and management by telemedicine and the availability of in person appointments.  The patient understood the limitations and agreed to proceed. Patient also understood that the telehealth visit is billable.  Location of the patient: Home  Location of the provider: Office Only the patient and myself were participating in the encounter    Reason for Appointment: Diabetes follow-up   History of Present Illness   Diagnosis: Type 2 DIABETES MELITUS, long-standing    Previous history:  She has been on insulin for a few years. Because of cost she has been switched from Lantus to NPH insulin twice a day She had previously been taking metformin but this was stopped when she had renal insufficiency Overall her blood sugars have been difficult to control and she has previously had relatively poor control. However with adding Actos her blo weight 78 year old closely checking her sugars before eating so that by carbohydrate load after about 2 after lunch or dinner od sugars had started improving significantly Also with her being very consistent with diet in 2015 her A1c had gone down to 6.4 To improve her control she was given a trial of the V-go pump in 2/17 but she had allergic skin reaction with the adhesive  Recent history:    Insulin regimen: Humulin N 25 a.m., 25 HS; Novolin R 5 units prn  Oral hypoglycemic drugs: Actos 15 mg, Metformin 500 mg in a.m.-1000 mg   Her A1c is still consistently high around 8%  Current blood sugar patterns, management and problems identified:   Her blood sugars have been checked only rarely and only 5 times in the last 2 weeks or so  She thinks she does do self some readings after supper at night but less frequently  Usually  her first meal of the day is around 11 AM  However she thinks her fasting reading was 119 today and is only sometimes high around 140  Lab glucose was 135  She did have a reading of over 300 once last month and she did not know why  She said that she is eating more starchy foods and cereals and not getting much protein because of difficulty getting out to the grocery store  However not consistently taking Novolin R when she has high carbohydrate meals  Also usually not checking enough readings after meals at night or after breakfast  No recent reports of hypoglycemia                 Side effects from medications:She did not tolerate Invokana and was having muscle cramps  Monitors blood glucose: occasionally     Glucometer:  FreeStyle        Blood Glucose readings from patient reporting blood sugars from her meter  Blood sugar range 83-169, not clear which readings are after meals Unknown average  Previous readings:  PRE-MEAL Fasting Lunch Dinner Bedtime Overall  Glucose range:  109-156   150-179   Mean/median: 132    163 136   POST-MEAL PC Breakfast PC Lunch PC Dinner  Glucose range:  133, 130  133   Mean/median:        Meals: 2-3 meals per day. Bfst 10 Dinner around 7-8 pm  Am: usually egg and toast at 11 am  Physical activity: exercise: None  Wt Readings from Last 3 Encounters:  09/14/18 238 lb (108 kg)  06/15/18 246 lb (111.6 kg)  06/05/18 245 lb (111.1 kg)   LABS:6.7  Lab Results  Component Value Date   HGBA1C 8.0 (H) 12/13/2018   HGBA1C 7.8 (H) 09/11/2018   HGBA1C 8.2 (H) 05/22/2018   Lab Results  Component Value Date   MICROALBUR 2.9 (H) 12/13/2018   LDLCALC 50 01/10/2018   CREATININE 1.17 12/13/2018   Lab on 12/13/2018  Component Date Value Ref Range Status   Free T4 12/13/2018 1.15  0.60 - 1.60 ng/dL Final   Comment: Specimens from patients who are undergoing biotin therapy and /or ingesting biotin supplements may contain high levels of  biotin.  The higher biotin concentration in these specimens interferes with this Free T4 assay.  Specimens that contain high levels  of biotin may cause false high results for this Free T4 assay.  Please interpret results in light of the total clinical presentation of the patient.     TSH 12/13/2018 1.84  0.35 - 4.50 uIU/mL Final   Color, Urine 12/13/2018 YELLOW  Yellow;Lt. Yellow;Straw;Dark Yellow;Amber;Green;Red;Brown Final   APPearance 12/13/2018 Cloudy* Clear;Turbid;Slightly Cloudy;Cloudy Final   Specific Gravity, Urine 12/13/2018 1.015  1.000 - 1.030 Final   pH 12/13/2018 7.0  5.0 - 8.0 Final   Total Protein, Urine 12/13/2018 NEGATIVE  Negative Final   Urine Glucose 12/13/2018 NEGATIVE  Negative Final   Ketones, ur 12/13/2018 NEGATIVE  Negative Final   Bilirubin Urine 12/13/2018 NEGATIVE  Negative Final   Hgb urine dipstick 12/13/2018 NEGATIVE  Negative Final   Urobilinogen, UA 12/13/2018 0.2  0.0 - 1.0 Final   Leukocytes,Ua 12/13/2018 NEGATIVE  Negative Final   Nitrite 12/13/2018 POSITIVE* Negative Final   WBC, UA 12/13/2018 3-6/hpf* 0-2/hpf Final   RBC / HPF 12/13/2018 none seen  0-2/hpf Final   Squamous Epithelial / LPF 12/13/2018 Rare(0-4/hpf)  Rare(0-4/hpf) Final   Bacteria, UA 12/13/2018 Many(>50/hpf)* None Final   Microalb, Ur 12/13/2018 2.9* 0.0 - 1.9 mg/dL Final   Creatinine,U 12/13/2018 49.1  mg/dL Final   Microalb Creat Ratio 12/13/2018 6.0  0.0 - 30.0 mg/g Final   Sodium 12/13/2018 140  135 - 145 mEq/L Final   Potassium 12/13/2018 4.1  3.5 - 5.1 mEq/L Final   Chloride 12/13/2018 100  96 - 112 mEq/L Final   CO2 12/13/2018 32  19 - 32 mEq/L Final   Glucose, Bld 12/13/2018 135* 70 - 99 mg/dL Final   BUN 12/13/2018 26* 6 - 23 mg/dL Final   Creatinine, Ser 12/13/2018 1.17  0.40 - 1.20 mg/dL Final   Calcium 12/13/2018 9.5  8.4 - 10.5 mg/dL Final   GFR 12/13/2018 44.75* >60.00 mL/min Final   Hgb A1c MFr Bld 12/13/2018 8.0* 4.6 - 6.5 % Final    Glycemic Control Guidelines for People with Diabetes:Non Diabetic:  <6%Goal of Therapy: <7%Additional Action Suggested:  >8%      Allergies as of 12/15/2018      Reactions   Pneumococcal Vaccine Anaphylaxis   weakness   Pneumovax 23 [pneumococcal Vac Polyvalent] Anaphylaxis   weakness   Other Hives   Other reaction(s): Other (See Comments) Pneumonia  Vaccine- very ill   Sulfa Antibiotics Hives      Medication List       Accurate as of December 15, 2018 10:21 AM. Always use your most recent med list.        acetaminophen 325 MG tablet Commonly known as:  TYLENOL  Take by mouth every 6 (six) hours as needed for mild pain or moderate pain.   acyclovir 400 MG tablet Commonly known as:  ZOVIRAX Take 400 mg by mouth 2 (two) times daily.   ALPRAZolam 0.25 MG tablet Commonly known as:  XANAX Take 0.25 mg by mouth at bedtime as needed for anxiety.   amLODipine 5 MG tablet Commonly known as:  NORVASC Take 1 tablet (5 mg total) by mouth daily.   atorvastatin 40 MG tablet Commonly known as:  LIPITOR TAKE 1 TABLET(40 MG) BY MOUTH DAILY AT 6 PM   CALCIUM PO Take 1 tablet by mouth daily. 600 mg   Cholecalciferol 50 MCG (2000 UT) Tabs Take 4,000 Units by mouth daily.   Colcrys 0.6 MG tablet Generic drug:  colchicine Take 0.6 mg by mouth daily as needed (gout).   Eliquis 5 MG Tabs tablet Generic drug:  apixaban TAKE 1 TABLET(5 MG) BY MOUTH TWICE DAILY   gabapentin 100 MG capsule Commonly known as:  NEURONTIN Take 100-200 mg by mouth at bedtime.   glucose blood test strip Commonly known as:  FREESTYLE LITE Use to test blood sugar twice daily. Dx: E11.9   hydrochlorothiazide 25 MG tablet Commonly known as:  HYDRODIURIL TAKE 1 TABLET(25 MG) BY MOUTH DAILY   hydrocortisone cream 1 % Apply 1 application topically daily as needed for itching.   insulin NPH Human 100 UNIT/ML injection Commonly known as:  NOVOLIN N Inject 25 Units into the skin 2 (two) times daily before a  meal. INJECT 25 UNITS UNDER THE SKIN TWICE DAILY WITH MEALS.   insulin regular 100 units/mL injection Commonly known as:  NovoLIN R ReliOn Inject 0.06-0.1 mLs (6-10 Units total) into the skin 3 (three) times daily before meals.   Insulin Syringe-Needle U-100 31G X 5/16" 1 ML Misc Commonly known as:  B-D INS SYR ULTRAFINE 1CC/31G Use 3 times daily to inject insulin   levothyroxine 150 MCG tablet Commonly known as:  SYNTHROID Take 1 tablet (150 mcg total) by mouth daily before breakfast.   magnesium 30 MG tablet Take 30 mg by mouth at bedtime.   metFORMIN 500 MG tablet Commonly known as:  GLUCOPHAGE TAKE 1 TABLET BY MOUTH EVERY MORNING AND 2 TABLETS BY MOUTH EVERY EVENING   metoprolol succinate 25 MG 24 hr tablet Commonly known as:  TOPROL-XL TAKE 1 TABLET BY MOUTH TWICE DAILY   NEEDLE (DISP) 30 G 30G X 1/2" Misc Commonly known as:  BD Disp Needles 1 each by Does not apply route 2 (two) times daily before a meal.   OneTouch Delica Lancets 06T Misc Use to check blood sugars once daily   pioglitazone 15 MG tablet Commonly known as:  ACTOS TAKE 1 TABLET BY MOUTH EVERY DAY   prednisoLONE acetate 1 % ophthalmic suspension Commonly known as:  PRED FORTE Place 2 drops into the right eye 4 (four) times daily.   timolol 0.5 % ophthalmic solution Commonly known as:  TIMOPTIC Place 1 drop into both eyes daily.   vitamin B-12 1000 MCG tablet Commonly known as:  CYANOCOBALAMIN Take 1,000 mcg by mouth daily.   vitamin C 500 MG tablet Commonly known as:  ASCORBIC ACID Take 500 mg by mouth daily.       Allergies:  Allergies  Allergen Reactions   Pneumococcal Vaccine Anaphylaxis    weakness   Pneumovax 23 [Pneumococcal Vac Polyvalent] Anaphylaxis    weakness   Other Hives    Other reaction(s): Other (See Comments) Pneumonia  Vaccine- very ill   Sulfa Antibiotics Hives    Past Medical History:  Diagnosis Date   Anemia    s/p Heme work -up normal EGD and  colonscopy in 2012 per pt,neg SPEP   Anxiety    Aortic stenosis    very mild by echo 05/2017   Bilateral carotid artery stenosis 06/08/2015   1-39% bilateral   Bradycardia    CKD (chronic kidney disease), stage III ([place])    Diabetes mellitus without complication (HCC)    type 2   GERD (gastroesophageal reflux disease)    Gout    HSV-1 (herpes simplex virus 1) infection    Acyclovir prn   Hyperkalemia    Hyperlipidemia    Hypertension    Joint pain    osteoarthritis by Xray- possible erosion of R 4th MCP,elevated uric  acid ,ANA +    Morbid obesity (HCC)    OSA on CPAP    Osteopenia    PAF (paroxysmal atrial fibrillation) ([place])    Pain management    Neurosurg: Dr Clydell Hakim   Pulmonary HTN ([place])    Moderate by echo  2016. PASP 52mmHg   TIA (transient ischemic attack)    remote history of TIA's early 2000    Past Surgical History:  Procedure Laterality Date   ABDOMINAL HYSTERECTOMY     APPENDECTOMY     BACK SURGERY     cervical fusion   BLADDER SURGERY     CESAREAN SECTION     CHOLECYSTECTOMY      Family History  Problem Relation Age of Onset   Arrhythmia Brother     Social History:  reports that she has never smoked. She has never used smokeless tobacco. She reports that she does not drink alcohol or use drugs.  Review of Systems:   Hypertension:   This has been managed by her PCP or nephrologist She is taking amlodipine and HCTZ  Lipids: Triglycerides have been high to a variable extent LDL has been below 100 with Lipitor 40 mg prescribed by her cardiologist  Lab Results  Component Value Date   CHOL 114 01/10/2018   HDL 31.50 (L) 01/10/2018   LDLCALC 50 01/10/2018   LDLDIRECT 97.2 05/31/2013   TRIG 163.0 (H) 01/10/2018   CHOLHDL 4 01/10/2018    Renal insufficiency: Her creatinine is upper normal and stable, followed by nephrologist also She is not taking lisinopril because of hyperkalemia previously   Lab Results    Component Value Date   CREATININE 1.17 12/13/2018    She has hypothyroidism and is taking 150g levothyroxine, now taking 7-1/2 tablets a week Last TSH was 5.3 and now back to normal  She is regular with taking her supplement before breakfast  Lab Results  Component Value Date   TSH 1.84 12/13/2018   TSH 5.34 (H) 09/11/2018   TSH 1.20 01/10/2018   FREET4 1.15 12/13/2018   FREET4 1.52 01/10/2018   FREET4 1.24 07/03/2015     Prescribed gabapentin 100 mg as needed for mild burning and tingling      Examination:   There were no vitals taken for this visit.  There is no height or weight on file to calculate BMI.     ASSESSMENT/ PLAN:   Diabetes type 2 with obesity, insulin requiring:    See history of present illness for detailed discussion of  current management, blood sugar patterns and issues identified  Her A1c is usually around 8%  Although it has been as low as  7.3 not sure if her blood sugars are being tested enough especially after meals to account for the high A1c She says her meal planning is not very good recently because of not getting balanced meals and less protein Also not able to do any exercise because of back pain and sciatica which is currently untreated  Fasting readings are mostly near target with her 25 units of NPH  Discussed needing to check more readings after meals and she is eating high carbohydrate meals should take her regular insulin also We will also check her fructosamine on the next visit   RENAL dysfunction: Not present currently Urine microalbumin normal  HYPOTHYROIDISM: Well-controlled now and she will continue the same regimen of 7-1/2 tablets a week  There are no Patient Instructions on file for this visit.    Elayne Snare 12/15/2018, 10:21 AM

## 2018-12-18 DIAGNOSIS — M545 Low back pain, unspecified: Secondary | ICD-10-CM | POA: Insufficient documentation

## 2018-12-18 DIAGNOSIS — M25562 Pain in left knee: Secondary | ICD-10-CM | POA: Diagnosis not present

## 2018-12-18 DIAGNOSIS — S8010XA Contusion of unspecified lower leg, initial encounter: Secondary | ICD-10-CM | POA: Insufficient documentation

## 2018-12-18 DIAGNOSIS — S8012XA Contusion of left lower leg, initial encounter: Secondary | ICD-10-CM | POA: Diagnosis not present

## 2018-12-19 ENCOUNTER — Telehealth: Payer: Self-pay | Admitting: Cardiology

## 2018-12-19 NOTE — Telephone Encounter (Signed)
She is now in chronic afib with a very high CHADS2VASC score and would prefer a different form of treatment for back pain where she does not have to be off anticoagulation for more than 24 hours, otherwise patient needs to understand high risk of CVA if she is off anticoagulants for more than 24 hours.

## 2018-12-19 NOTE — Telephone Encounter (Signed)
Patient needs to have an injections for her back she wants to know if she can be off her blood thinners for 5 days.

## 2018-12-19 NOTE — Telephone Encounter (Signed)
Pt takes Eliquis for afib with CHADS2VASc score of 20 (age x2, sex, CHF, HTN, DM, CAD, TIA in early 2000s). CrCl is 18mL/min. Do not need a 5 day hold on Eliquis for any procedure, 3 days is our protocol for spinal procedures. However, with patient's elevated cardiac risk, will defer to Dr Radford Pax regarding appropriate length of time to hold Eliquis prior to back injection.

## 2018-12-20 ENCOUNTER — Other Ambulatory Visit: Payer: Self-pay

## 2018-12-20 ENCOUNTER — Ambulatory Visit: Payer: PPO | Admitting: Podiatry

## 2018-12-20 DIAGNOSIS — B351 Tinea unguium: Secondary | ICD-10-CM | POA: Diagnosis not present

## 2018-12-20 DIAGNOSIS — L84 Corns and callosities: Secondary | ICD-10-CM

## 2018-12-20 DIAGNOSIS — E119 Type 2 diabetes mellitus without complications: Secondary | ICD-10-CM

## 2018-12-20 DIAGNOSIS — M79674 Pain in right toe(s): Secondary | ICD-10-CM | POA: Diagnosis not present

## 2018-12-20 DIAGNOSIS — M79675 Pain in left toe(s): Secondary | ICD-10-CM | POA: Diagnosis not present

## 2018-12-20 DIAGNOSIS — Z794 Long term (current) use of insulin: Secondary | ICD-10-CM

## 2018-12-20 NOTE — Telephone Encounter (Signed)
   Primary Cardiologist: Fransico Him, MD  Chart reviewed as part of pre-operative protocol coverage. Patient was contacted 12/20/2018 in reference to pre-operative risk assessment for pending surgery as outlined below.   Per our pharmacy staff: Pt takes Eliquis for afib with CHADS2VASc score of 41 (age x2, sex, CHF, HTN, DM, CAD, TIA in early 2000s). CrCl is 23mL/min. Do not need a 5 day hold on Eliquis for any procedure, 3 days is our protocol for spinal procedures. However, with patient's elevated cardiac risk, will defer to Dr Radford Pax regarding appropriate length of time to hold Eliquis prior to back injection.  Dr. Radford Pax was contacted and communicated the following: She is now in chronic afib with a very high CHADS2VASC score and would prefer a different form of treatment for back pain where she does not have to be off anticoagulation for more than 24 hours, otherwise patient needs to understand high risk of CVA if she is off anticoagulants for more than 24 hours.  I called and spoke with the patient 12/20/18 and discussed her higher risk of stroke while off of the eliquis for three days. She understands the risk. Her back pain has been debilitating and she can't take any other pharmacological agent for pain relief. She wants to proceed with spinal injection and hold eliquis for three days prior.   I will route this recommendation to the requesting party via Epic fax function and remove from pre-op pool.  Please call with questions.  Tami Lin Duke, PA 12/20/2018, 9:48 AM

## 2018-12-20 NOTE — Telephone Encounter (Signed)
Left voicemail for patient to call back to discuss Eliquis and her high stroke risk.

## 2018-12-20 NOTE — Patient Instructions (Signed)

## 2018-12-27 ENCOUNTER — Encounter: Payer: Self-pay | Admitting: Podiatry

## 2018-12-27 NOTE — Progress Notes (Signed)
Subjective: Tina Hester is a 78 y.o. y.o. female who is on long term blood thinner Eliquis, presents today for preventative diabetic footcare.    Patient's PCP is Jonathon Jordan, MD.  She is followed by Dr. Dwyane Dee for management of her diabetes and hypothyroidism.  She takes gabapentin for tingling and burning in her feet.  Current Outpatient Medications:  .  acetaminophen (TYLENOL) 325 MG tablet, Take by mouth every 6 (six) hours as needed for mild pain or moderate pain., Disp: , Rfl:  .  acyclovir (ZOVIRAX) 400 MG tablet, Take 400 mg by mouth 2 (two) times daily. , Disp: , Rfl:  .  ALPRAZolam (XANAX) 0.25 MG tablet, Take 0.25 mg by mouth at bedtime as needed for anxiety., Disp: , Rfl:  .  amLODipine (NORVASC) 5 MG tablet, Take 1 tablet (5 mg total) by mouth daily., Disp: 90 tablet, Rfl: 3 .  atorvastatin (LIPITOR) 40 MG tablet, TAKE 1 TABLET(40 MG) BY MOUTH DAILY AT 6 PM, Disp: 90 tablet, Rfl: 2 .  CALCIUM PO, Take 1 tablet by mouth daily. 600 mg, Disp: , Rfl:  .  Cholecalciferol 2000 units TABS, Take 4,000 Units by mouth daily., Disp: , Rfl:  .  COLCRYS 0.6 MG tablet, Take 0.6 mg by mouth daily as needed (gout). , Disp: , Rfl:  .  ELIQUIS 5 MG TABS tablet, TAKE 1 TABLET(5 MG) BY MOUTH TWICE DAILY (Patient taking differently: Take 5 mg by mouth 2 (two) times daily. ), Disp: 180 tablet, Rfl: 2 .  gabapentin (NEURONTIN) 100 MG capsule, Take 100-200 mg by mouth at bedtime. , Disp: , Rfl:  .  glucose blood (FREESTYLE LITE) test strip, Use to test blood sugar twice daily. Dx: E11.9, Disp: 200 each, Rfl: 1 .  hydrochlorothiazide (HYDRODIURIL) 25 MG tablet, TAKE 1 TABLET(25 MG) BY MOUTH DAILY, Disp: 90 tablet, Rfl: 2 .  hydrocortisone cream 1 %, Apply 1 application topically daily as needed for itching., Disp: , Rfl:  .  insulin NPH Human (HUMULIN N,NOVOLIN N) 100 UNIT/ML injection, Inject 25 Units into the skin 2 (two) times daily before a meal. INJECT 25 UNITS UNDER THE SKIN TWICE DAILY  WITH MEALS., Disp: , Rfl:  .  insulin regular (NOVOLIN R RELION) 100 units/mL injection, Inject 0.06-0.1 mLs (6-10 Units total) into the skin 3 (three) times daily before meals. (Patient taking differently: Inject 6-10 Units into the skin 2 (two) times daily as needed (only when eating a big meal). ), Disp: 10 mL, Rfl: 1 .  Insulin Syringe-Needle U-100 (B-D INS SYR ULTRAFINE 1CC/31G) 31G X 5/16" 1 ML MISC, Use 3 times daily to inject insulin, Disp: 100 each, Rfl: 0 .  levothyroxine (SYNTHROID) 137 MCG tablet, TK 1 T PO QAM OES, Disp: , Rfl:  .  levothyroxine (SYNTHROID, LEVOTHROID) 150 MCG tablet, Take 1 tablet (150 mcg total) by mouth daily before breakfast., Disp: 30 tablet, Rfl: 2 .  magnesium 30 MG tablet, Take 30 mg by mouth at bedtime. , Disp: , Rfl:  .  metFORMIN (GLUCOPHAGE) 500 MG tablet, TAKE 1 TABLET BY MOUTH EVERY MORNING AND 2 TABLETS BY MOUTH EVERY EVENING (Patient taking differently: Take 500-1,000 mg by mouth See admin instructions. TAKE 1 TABLET BY MOUTH EVERY MORNING AND 2 TABLETS BY MOUTH EVERY EVENING), Disp: 270 tablet, Rfl: 3 .  metoprolol succinate (TOPROL-XL) 25 MG 24 hr tablet, TAKE 1 TABLET BY MOUTH TWICE DAILY, Disp: 180 tablet, Rfl: 3 .  NEEDLE, DISP, 30 G (BD DISP NEEDLES) 30G  X 1/2" MISC, 1 each by Does not apply route 2 (two) times daily before a meal., Disp: 100 each, Rfl: 5 .  ONETOUCH DELICA LANCETS 02B MISC, Use to check blood sugars once daily, Disp: 50 each, Rfl: 3 .  pioglitazone (ACTOS) 15 MG tablet, TAKE 1 TABLET BY MOUTH EVERY DAY, Disp: 90 tablet, Rfl: 0 .  prednisoLONE acetate (PRED FORTE) 1 % ophthalmic suspension, Place 2 drops into the right eye 4 (four) times daily. , Disp: , Rfl: 2 .  timolol (TIMOPTIC) 0.5 % ophthalmic solution, Place 1 drop into both eyes daily. , Disp: , Rfl: 1 .  vitamin B-12 (CYANOCOBALAMIN) 1000 MCG tablet, Take 1,000 mcg by mouth daily., Disp: , Rfl:  .  vitamin C (ASCORBIC ACID) 500 MG tablet, Take 500 mg by mouth daily., Disp: ,  Rfl:    Allergies  Allergen Reactions  . Pneumococcal Vaccine Anaphylaxis    weakness  . Pneumovax 23 [Pneumococcal Vac Polyvalent] Anaphylaxis    weakness  . Other Hives    Other reaction(s): Other (See Comments) Pneumonia  Vaccine- very ill  . Sulfa Antibiotics Hives     Objective: Vascular Examination: Capillary refill time immediate x 10 digits.  Dorsalis pedis pulses palpable b/l.  Posterior tibial pulses palpable b/l.  No digital hair x 10 digits.  Skin temperature gradient WNL b/l.  Dermatological Examination: Skin with normal turgor, texture and tone b/l.  Toenails 1-5 b/l discolored, thick, dystrophic with subungual debris and pain with palpation to nailbeds due to thickness of nails.  Hyperkeratotic lesion submetatarsal head 1 b/l. No erythema, no edema, no drainage, no flocculence noted.  Musculoskeletal: Muscle strength 5/5 to all LE muscle groups.  HAV with bunion deformity b/l.  Dorsal eminence noted right midfoot.   Neurological: Sensation intact with 10 gram monofilament.  Vibratory sensation intact.  Assessment: Painful onychomycosis toenails 1-5 b/l in patient on blood thinner.  Callus submet head 1 b/l NIDDM  Plan: 1. Toenails 1-5 b/l were debrided in length and girth without iatrogenic bleeding. Calluses pared submetatarsal head(s) 1 b/l utilizing sterile scalpel blade without incident. 2. Patient to continue soft, supportive shoe gear daily. 3. Patient to report any pedal injuries to medical professional immediately. 4. Avoid self trimming due to use of blood thinner. 5. Follow up 3 months.  6. Patient/POA to call should there be a concern in the interim.

## 2018-12-28 DIAGNOSIS — E039 Hypothyroidism, unspecified: Secondary | ICD-10-CM | POA: Diagnosis not present

## 2018-12-28 DIAGNOSIS — Z0000 Encounter for general adult medical examination without abnormal findings: Secondary | ICD-10-CM | POA: Diagnosis not present

## 2018-12-28 DIAGNOSIS — I10 Essential (primary) hypertension: Secondary | ICD-10-CM | POA: Diagnosis not present

## 2018-12-28 DIAGNOSIS — N183 Chronic kidney disease, stage 3 (moderate): Secondary | ICD-10-CM | POA: Diagnosis not present

## 2018-12-28 DIAGNOSIS — N390 Urinary tract infection, site not specified: Secondary | ICD-10-CM | POA: Diagnosis not present

## 2018-12-28 DIAGNOSIS — F322 Major depressive disorder, single episode, severe without psychotic features: Secondary | ICD-10-CM | POA: Diagnosis not present

## 2018-12-28 DIAGNOSIS — E11319 Type 2 diabetes mellitus with unspecified diabetic retinopathy without macular edema: Secondary | ICD-10-CM | POA: Diagnosis not present

## 2019-01-02 DIAGNOSIS — M549 Dorsalgia, unspecified: Secondary | ICD-10-CM | POA: Diagnosis not present

## 2019-01-02 DIAGNOSIS — G8929 Other chronic pain: Secondary | ICD-10-CM | POA: Diagnosis not present

## 2019-01-02 DIAGNOSIS — M47816 Spondylosis without myelopathy or radiculopathy, lumbar region: Secondary | ICD-10-CM | POA: Diagnosis not present

## 2019-01-02 DIAGNOSIS — M5440 Lumbago with sciatica, unspecified side: Secondary | ICD-10-CM | POA: Diagnosis not present

## 2019-01-02 DIAGNOSIS — M546 Pain in thoracic spine: Secondary | ICD-10-CM | POA: Diagnosis not present

## 2019-01-02 DIAGNOSIS — M5136 Other intervertebral disc degeneration, lumbar region: Secondary | ICD-10-CM | POA: Diagnosis not present

## 2019-01-03 ENCOUNTER — Other Ambulatory Visit: Payer: Self-pay | Admitting: Neurosurgery

## 2019-01-03 DIAGNOSIS — M5440 Lumbago with sciatica, unspecified side: Secondary | ICD-10-CM

## 2019-01-09 ENCOUNTER — Other Ambulatory Visit: Payer: PPO

## 2019-01-10 ENCOUNTER — Telehealth: Payer: Self-pay

## 2019-01-10 NOTE — Telephone Encounter (Signed)
Please comment on eliquis. 

## 2019-01-10 NOTE — Telephone Encounter (Signed)
   [facility] Pre-operative Risk Assessment    Request for surgical clearance:  1. What type of surgery is being performed? Lumbar surgery   2. When is this surgery scheduled? TBD   3. What type of clearance is required (medical clearance vs. Pharmacy clearance to hold med vs. Both)? Medical. Pt on Eliquis, there are no directions/requests for holding med.   "Pt can hold [id number], ___________days before surgery  4. Are there any medications that need to be held prior to surgery and how long? See above   5. Practice name and name of physician performing surgery? [facility]   6. What is your office phone number [phone],  Ext [phone]   7.   What is your office fax number [phone]  8.   Anesthesia type (None, local, MAC, general) ? General anesthesia   Rodman Key 01/10/2019, 2:22 PM  _________________________________________________________________   (provider comments below)

## 2019-01-10 NOTE — Telephone Encounter (Signed)
Pt takes Eliquis for afib with CHADS2VASc score of 31 (age x2, sex, CHF, HTN, DM, CAD, TIA). SCr 1.17. Typically hold DOACs for 3 days prior to spinal surgery, however with patient's CHADS2VASc score of 9, will defer to MD on appropriate length of hold.

## 2019-01-14 NOTE — Telephone Encounter (Signed)
As stated in a prior note, she is now in chronic atrial fibrillation and never followed up with EP to discuss afib ablation.  Since she is in afib and has a very high CHADS2VASC score I do not think it is safe to hold anticoagulation longer then 24 hours.  Recommend no surgical alternatives.  If she is adamant about proceeding with neurosurgery then she needs to understand that her risk of cardioembolic event off of DOAC is as high as 15% and this should be documented.

## 2019-01-16 NOTE — Telephone Encounter (Signed)
   Primary Cardiologist: Fransico Him, MD  Chart reviewed as part of pre-operative protocol coverage.   Tina Hester was last seen on 06/15/18 by Roderic Palau, NP.   [facility] has requested guidance on holding eliquis for lumbar surgery.  Per our pharmacy staff: Pt takes Eliquis for afib with CHADS2VASc score of 75 (age x2, sex, CHF, HTN, DM, CAD, TIA). SCr 1.17. Typically hold DOACs for 3 days prior to spinal surgery, however with patient's CHADS2VASc score of 9, will defer to MD on appropriate length of hold.  Per Dr. Radford Pax: As stated in a prior note, she is now in chronic atrial fibrillation and never followed up with EP to discuss afib ablation.  Since she is in afib and has a very high CHADS2VASC score I do not think it is safe to hold anticoagulation longer then 24 hours.  Recommend no surgical alternatives.  If she is adamant about proceeding with neurosurgery then she needs to understand that her risk of cardioembolic event off of DOAC is as high as 15% and this should be documented.  I will send this information regarding eliquis to the requesting office. If they wish to proceed with surgery without disrupting anticoagulation, then she will need an appt for medical clearance.  I will await a decision from [facility].    Webb, PA 01/16/2019, 9:09 AM

## 2019-01-16 NOTE — Telephone Encounter (Signed)
I am fine with Lovenox bridging

## 2019-01-16 NOTE — Telephone Encounter (Addendum)
I sent the recommendations from pharmacy and Dr. Radford Pax regarding holding anticoagulation to the requesting office. [facility] is now asking if she is a candidate for lovenox bridging for either surgery or epidural injection.   I will route to both pharmacy and to Dr. Radford Pax for guidance.

## 2019-01-16 NOTE — Telephone Encounter (Signed)
Patient with diagnosis of AFIB on Eliquis for anticoagulation.    Procedure: Spinal/Limbar surgery Date of procedure: TBD  CHADS2-VASc score of  9 (CHF, HTN, AGE x 2, DM2, stroke/tia x 2, CAD, female)  Scr = 1.17  *Patient will be candidate for lovenox (enoxaparin) bridging while holding Eliquis prior to procedure* If agree, patient will need appointment with anticoagulation clinic 1 week prior to procedure to discuss bridging plan*  Will request MD input due to patient risk level.

## 2019-01-17 ENCOUNTER — Telehealth: Payer: Self-pay

## 2019-01-17 NOTE — Telephone Encounter (Signed)
   Primary Cardiologist: Fransico Him, MD  Chart reviewed as part of pre-operative protocol coverage. Tina Hester was last seen on 06/15/18 by Roderic Palau, NP.   [facility] initially equested guidance on holding eliquis for lumbar surgery.  Per our pharmacy staff: Pt takes Eliquis for afib with CHADS2VASc score of 43 (age x2, sex, CHF, HTN, DM, CAD, TIA). SCr 1.17. Typically hold DOACs for 3 days prior to spinal surgery, however with patient's CHADS2VASc score of 9, will defer to MD on appropriate length of hold.  Per Dr. Radford Pax: As stated in a prior note, she is now in chronic atrial fibrillation and never followed up with EP to discuss afib ablation. Since she is in afib and has a very high CHADS2VASC score I do not think it is safe to hold anticoagulation longer then 24 hours. Recommend no surgical alternatives. If she is adamant about proceeding with neurosurgery then she needs to understand that her risk of cardioembolic event off of DOAC is as high as 15% and this should be documented.   The requesting office then inquired about possible lovenox bridging. I reached out to our pharmacy staff and to Dr. Radford Pax:  Per our pharmacy staff: Patient with diagnosis of AFIB on Eliquis for anticoagulation.    Procedure: Spinal/Limbar surgery Date of procedure: TBD  CHADS2-VASc score of  9 (CHF, HTN, AGE x 2, DM2, stroke/tia x 2, CAD, female)  Scr = 1.17  *Patient will be candidate for lovenox (enoxaparin) bridging while holding Eliquis prior to procedure* If agree, patient will need appointment with anticoagulation clinic 1 week prior to procedure to discuss bridging plan*  Will request MD input due to patient risk level.   Per Dr. Radford Pax: I am fine with Lovenox bridging   I will re-fax these recommendations to the requesting office. Tina Hester will need an appt with [place] for medical clearance prior to surgery. I will message our staff to  schedule. Once a surgery date is scheduled, she will need an appt with our pharmacist, as above.  Please schedule a virtual visit with Dr. Radford Pax or an APP.   Please call with questions.  Tami Lin Tina Ridgely, PA 01/17/2019, 10:14 AM

## 2019-01-17 NOTE — Telephone Encounter (Signed)
Pt is scheduled for a virtual visit with Dr. Radford Pax on 01/23/19

## 2019-01-17 NOTE — Telephone Encounter (Signed)
YOUR CARDIOLOGY TEAM HAS ARRANGED FOR AN E-VISIT FOR YOUR APPOINTMENT - PLEASE REVIEW IMPORTANT INFORMATION BELOW SEVERAL DAYS PRIOR TO YOUR APPOINTMENT  Due to the recent COVID-19 pandemic, we are transitioning in-person office visits to tele-medicine visits in an effort to decrease unnecessary exposure to our patients, their families, and staff. These visits are billed to your insurance just like a normal visit is. We also encourage you to sign up for MyChart if you have not already done so. You will need a smartphone if possible. For patients that do not have this, we can still complete the visit using a regular telephone but do prefer a smartphone to enable video when possible. You may have a family member that lives with you that can help. If possible, we also ask that you have a blood pressure cuff and scale at home to measure your blood pressure, heart rate and weight prior to your scheduled appointment. Patients with clinical needs that need an in-person evaluation and testing will still be able to come to the office if absolutely necessary. If you have any questions, feel free to call our office.     YOUR PROVIDER WILL BE USING THE FOLLOWING PLATFORM TO COMPLETE YOUR VISIT: Doximity   . IF USING MYCHART - How to Download the xMyChart App to Your SmartPhone   - If Apple, go to [employer] and type in MyChart in the search bar and download the app. If Android, ask patient to go to [employer] and type in [place] in the search bar and download the app. The app is free but as with any other app downloads, your phone may require you to verify saved payment information or Apple/Android password.  - You will need to then log into the app with your MyChart username and password, and select [facility] as your healthcare provider to link the account.  - When it is time for your visit, go to the MyChart app, find appointments, and click Begin Video Visit. Be sure to Select Allow for your device to  access the Microphone and Camera for your visit. You will then be connected, and your provider will be with you shortly.  **If you have any issues connecting or need assistance, please contact MyChart service desk [phone])**  **If using a computer, in order to ensure the best quality for your visit, you will need to use either of the following Internet Browsers: Insurance underwriter or [employer]**  . IF USING DOXIMITY or DOXY.ME - The staff will give you instructions on receiving your link to join the meeting the day of your visit.      2-3 DAYS BEFORE YOUR APPOINTMENT  You will receive a telephone call from one of our [facility] team members - your caller ID may say "Unknown caller." If this is a video visit, we will walk you through how to get the video launched on your phone. We will remind you check your blood pressure, heart rate and weight prior to your scheduled appointment. If you have an Apple Watch or Kardia, please upload any pertinent ECG strips the day before or morning of your appointment to [facility]. Our staff will also make sure you have reviewed the consent and agree to move forward with your scheduled tele-health visit.     THE DAY OF YOUR APPOINTMENT  Approximately 15 minutes prior to your scheduled appointment, you will receive a telephone call from one of [facility] team - your caller ID may say "Unknown caller."  Our staff will confirm medications, vital signs for the day and any symptoms you may be experiencing. Please have this information available prior to the time of visit start. It may also be helpful for you to have a pad of paper and pen handy for any instructions given during your visit. They will also walk you through joining the smartphone meeting if this is a video visit.    CONSENT FOR TELE-HEALTH VISIT - PLEASE REVIEW  I hereby voluntarily request, consent and authorize [facility] and its employed or contracted physicians, physician  assistants, nurse practitioners or other licensed health care professionals (the Practitioner), to provide me with telemedicine health care services (the "Services") as deemed necessary by the treating Practitioner. I acknowledge and consent to receive the Services by the Practitioner via telemedicine. I understand that the telemedicine visit will involve communicating with the Practitioner through live audiovisual communication technology and the disclosure of certain medical information by electronic transmission. I acknowledge that I have been given the opportunity to request an in-person assessment or other available alternative prior to the telemedicine visit and am voluntarily participating in the telemedicine visit.  I understand that I have the right to withhold or withdraw my consent to the use of telemedicine in the course of my care at any time, without affecting my right to future care or treatment, and that the Practitioner or I may terminate the telemedicine visit at any time. I understand that I have the right to inspect all information obtained and/or recorded in the course of the telemedicine visit and may receive copies of available information for a reasonable fee.  I understand that some of the potential risks of receiving the Services via telemedicine include:  . Delay or interruption in medical evaluation due to technological equipment failure or disruption; . Information transmitted may not be sufficient (e.g. poor resolution of images) to allow for appropriate medical decision making by the Practitioner; and/or  . In rare instances, security protocols could fail, causing a breach of personal health information.  Furthermore, I acknowledge that it is my responsibility to provide information about my medical history, conditions and care that is complete and accurate to the best of my ability. I acknowledge that Practitioner's advice, recommendations, and/or decision may be based on  factors not within their control, such as incomplete or inaccurate data provided by me or distortions of diagnostic images or specimens that may result from electronic transmissions. I understand that the practice of medicine is not an exact science and that Practitioner makes no warranties or guarantees regarding treatment outcomes. I acknowledge that I will receive a copy of this consent concurrently upon execution via email to the email address I last provided but may also request a printed copy by calling the office of [facility].    I understand that my insurance will be billed for this visit.   I have read or had this consent read to me. . I understand the contents of this consent, which adequately explains the benefits and risks of the Services being provided via telemedicine.  . I have been provided ample opportunity to ask questions regarding this consent and the Services and have had my questions answered to my satisfaction. . I give my informed consent for the services to be provided through the use of telemedicine in my medical care  By participating in this telemedicine visit I agree to the above.  

## 2019-01-23 ENCOUNTER — Encounter: Payer: Self-pay | Admitting: Cardiology

## 2019-01-23 ENCOUNTER — Other Ambulatory Visit: Payer: Self-pay

## 2019-01-23 ENCOUNTER — Telehealth (INDEPENDENT_AMBULATORY_CARE_PROVIDER_SITE_OTHER): Payer: PPO | Admitting: Cardiology

## 2019-01-23 VITALS — BP 130/71 | HR 79 | Ht 61.5 in | Wt 230.0 lb

## 2019-01-23 DIAGNOSIS — E785 Hyperlipidemia, unspecified: Secondary | ICD-10-CM

## 2019-01-23 DIAGNOSIS — G4733 Obstructive sleep apnea (adult) (pediatric): Secondary | ICD-10-CM

## 2019-01-23 DIAGNOSIS — I10 Essential (primary) hypertension: Secondary | ICD-10-CM

## 2019-01-23 DIAGNOSIS — I4819 Other persistent atrial fibrillation: Secondary | ICD-10-CM

## 2019-01-23 DIAGNOSIS — I350 Nonrheumatic aortic (valve) stenosis: Secondary | ICD-10-CM | POA: Diagnosis not present

## 2019-01-23 DIAGNOSIS — Z7901 Long term (current) use of anticoagulants: Secondary | ICD-10-CM

## 2019-01-23 DIAGNOSIS — I6523 Occlusion and stenosis of bilateral carotid arteries: Secondary | ICD-10-CM | POA: Diagnosis not present

## 2019-01-23 DIAGNOSIS — Z79899 Other long term (current) drug therapy: Secondary | ICD-10-CM | POA: Diagnosis not present

## 2019-01-23 DIAGNOSIS — E7800 Pure hypercholesterolemia, unspecified: Secondary | ICD-10-CM

## 2019-01-23 NOTE — Patient Instructions (Signed)
Medication Instructions:  Your physician recommends that you continue on your current medications as directed. Please refer to the Current Medication list given to you today.  If you need a refill on your cardiac medications before your next appointment, please call your pharmacy.   Lab work: None If you have labs (blood work) drawn today and your tests are completely normal, you will receive your results only by: Marland Kitchen MyChart Message (if you have MyChart) OR . A paper copy in the mail If you have any lab test that is abnormal or we need to change your treatment, we will call you to review the results.  Testing/Procedures: Your physician has requested that you have an echocardiogram. Echocardiography is a painless test that uses sound waves to create images of your heart. It provides your doctor with information about the size and shape of your heart and how well your heart's chambers and valves are working. This procedure takes approximately one hour. There are no restrictions for this procedure.  Your physician has requested that you have a carotid duplex. This test is an ultrasound of the carotid arteries in your neck. It looks at blood flow through these arteries that supply the brain with blood. Allow one hour for this exam. There are no restrictions or special instructions.  Follow-Up: At [facility], you and your health needs are our priority.  As part of our continuing mission to provide you with exceptional heart care, we have created designated Provider Care Teams.  These Care Teams include your primary Cardiologist (physician) and Advanced Practice Providers (APPs -  Physician Assistants and Nurse Practitioners) who all work together to provide you with the care you need, when you need it. You will need a follow up appointment in 6 months.  Please call our office 2 months in advance to schedule this appointment.  You may see Fransico Him, MD or one of the following Advanced Practice  Providers on your designated Care Team:   Shell, PA-C [name], PA-C . Ermalinda Barrios, PA-C   You have been referred to Electrophysiology with Dr. Curt Bears for afib

## 2019-01-23 NOTE — Progress Notes (Signed)
Virtual Visit via Video Note   This visit type was conducted due to national recommendations for restrictions regarding the COVID-19 Pandemic (e.g. social distancing) in an effort to limit this patient's exposure and mitigate transmission in our community.  Due to her co-morbid illnesses, this patient is at least at moderate risk for complications without adequate follow up.  This format is felt to be most appropriate for this patient at this time.  All issues noted in this document were discussed and addressed.  A limited physical exam was performed with this format.  Please refer to the patient's chart for her consent to telehealth for [facility].  Evaluation Performed:  Follow-up visit  This visit type was conducted due to national recommendations for restrictions regarding the COVID-19 Pandemic (e.g. social distancing).  This format is felt to be most appropriate for this patient at this time.  All issues noted in this document were discussed and addressed.  No physical exam was performed (except for noted visual exam findings with Video Visits).  Please refer to the patient's chart (MyChart message for video visits and phone note for telephone visits) for the patient's consent to telehealth for [facility].  Date:  01/23/2019   ID:  Tina Hester, DOB [birth year], MRN [id number]  Patient Location:  Home  Provider location:   [place]  PCP:  Jonathon Jordan, MD  Cardiologist:  Fransico Him, MD  Electrophysiologist:  None   Chief Complaint:  Atrial fibrillation, OSA, HTN  History of Present Illness:    Tina Hester is a 78 y.o. female who presents via audio/video conferencing for a telehealth visit today.    Tina Hester is a 78 y.o. female with a hx of PAF, HTN, bilateral carotid artery stenosis, aortic stenosis, morbid obesity and OSA on CPAP.   She is here today for followup and is doing well.  She denies any chest pain or pressure, SOB, DOE, PND,  orthopnea, LE edema, dizziness, palpitations or syncope. She is compliant with her meds and is tolerating meds with no SE.  She is doing well with her CPAP device and thinks that she has gotten used to it.  She tolerates the mask and feels the pressure is adequate.  Since going on CPAP she feels rested in the am and has no significant daytime sleepiness.  She denies any significant mouth or nasal dryness or nasal congestion.  She does not think that he snores.    The patient does not have symptoms concerning for COVID-19 infection (fever, chills, cough, or new shortness of breath).    Prior CV studies:   The following studies were reviewed today:  2D echo, carotid dopplers, labs  Past Medical History:  Diagnosis Date  . Anemia    s/p Heme work -up normal EGD and colonscopy in 2012 per pt,neg SPEP  . Anxiety   . Aortic stenosis    very mild by echo 05/2017  . Bilateral carotid artery stenosis 06/08/2015   1-39% bilateral  . Bradycardia   . CKD (chronic kidney disease), stage III ([place])   . Diabetes mellitus without complication ([place])    type 2  . GERD (gastroesophageal reflux disease)   . Gout   . HSV-1 (herpes simplex virus 1) infection    Acyclovir prn  . Hyperkalemia   . Hyperlipidemia   . Hypertension   . Joint pain    osteoarthritis by Xray- possible erosion of R 4th MCP,elevated uric  acid ,ANA +   .  Morbid obesity ([place])   . OSA on CPAP   . Osteopenia   . PAF (paroxysmal atrial fibrillation) ([place])   . Pain management    Neurosurg: Dr Clydell Hakim  . Pulmonary HTN ([place])    Moderate by echo  2016. PASP 1mmHg  . TIA (transient ischemic attack)    remote history of TIA's early 2000   Past Surgical History:  Procedure Laterality Date  . ABDOMINAL HYSTERECTOMY    . APPENDECTOMY    . BACK SURGERY     cervical fusion  . BLADDER SURGERY    . CESAREAN SECTION    . CHOLECYSTECTOMY       Current Meds  Medication Sig  . [DISCONTINUED] levothyroxine (SYNTHROID) 137 MCG  tablet TK 1 T PO QAM OES     Allergies:   Pneumococcal vaccine; Pneumovax 23 [pneumococcal vac polyvalent]; Other; and Sulfa antibiotics   Social History   Tobacco Use  . Smoking status: Never Smoker  . Smokeless tobacco: Never Used  Substance Use Topics  . Alcohol use: No  . Drug use: No     Family Hx: The patient's family history includes Arrhythmia in her brother.  ROS:   Please see the history of present illness.     All other systems reviewed and are negative.   Labs/Other Tests and Data Reviewed:    Recent Labs: 02/02/2018: Magnesium 2.2 04/11/2018: Hemoglobin 11.2; Platelets 188 09/11/2018: ALT 14 12/13/2018: BUN 26; Creatinine, Ser 1.17; Potassium 4.1; Sodium 140; TSH 1.84   Recent Lipid Panel Lab Results  Component Value Date/Time   CHOL 114 01/10/2018 08:31 AM   TRIG 163.0 (H) 01/10/2018 08:31 AM   HDL 31.50 (L) 01/10/2018 08:31 AM   CHOLHDL 4 01/10/2018 08:31 AM   LDLCALC 50 01/10/2018 08:31 AM   LDLDIRECT 97.2 05/31/2013 09:42 AM    Wt Readings from Last 3 Encounters:  01/23/19 230 lb (104.3 kg)  09/14/18 238 lb (108 kg)  06/15/18 246 lb (111.6 kg)     Objective:    Vital Signs:  BP 130/71   Pulse 79   Ht 5' 1.5" (1.562 m)   Wt 230 lb (104.3 kg)   BMI 42.75 kg/m    CONSTITUTIONAL:  Well nourished, well developed female in no acute distress.  EYES: anicteric MOUTH: oral mucosa is pink RESPIRATORY: Normal respiratory effort, symmetric expansion CARDIOVASCULAR: No peripheral edema SKIN: No rash, lesions or ulcers MUSCULOSKELETAL: no digital cyanosis NEURO: Cranial Nerves II-XII grossly intact, moves all extremities PSYCH: Intact judgement and insight.  A&O x 3, Mood/affect appropriate   ASSESSMENT & PLAN:    1.  Persistent atrial fibrillation -she has failed Multaq.  She is followed in A. fib clinic and was set up to see Dr. Garald Balding to discuss possible A. fib ablation as she did not want to take an antiarrhythmic drug.  This was back in the  fall and she has not seen him yet. She says that her HR can get as high as 100bpm with movement.  I am going to have her change her Toprol to 50mg  once daily instead of 25mg  BID to see if she gets better rate control.  She will continue on Eliquis 5mg  BID (age < 51 and creatinine < 1.5).  She has not had any bleeding problems on the Eliquis. She would like to get an appt with Dr. Lyndel Safe.    2.  Hypertension -her BP is well controlled on exam today.  She will continue on amlodipine 5 mg daily,  HCTZ 25 mg daily and Toprol XL.  Her last creatinine was 1.17 last month.  3.  Bilateral carotid artery stenosis -her last carotid Dopplers in 2018 showed mild carotid stenosis 1 to 39%.  I will repeat these this summer once COVID has settled down.  4.  Mild aortic stenosis -her last 2D echo was in 2018 showing a mean aortic valve gradient 15 mmHg.  I will repeat a 2D echo to make sure this is not progressed.  5.  Hyperlipidemia -her LDL goal is less than 70 due to carotid stenosis.  Her last LDL was 50 a year ago.  I will repeat an FLP and ALT.  She will continue on atorvastatin 40 g daily.  6.  Morbid obesity - I have encouraged her to get into a routine exercise program and cut back on carbs and portions.   7.  OSA - the patient is tolerating PAP therapy well without any problems. I will get a download from her DME  The patient has been using and benefiting from PAP use and will continue to benefit from therapy.    8.  COVID-19 Education:The signs and symptoms of COVID-19 were discussed with the patient and how to seek care for testing (follow up with PCP or arrange E-visit).  The importance of social distancing was discussed today.  Patient Risk:   After full review of this patient's clinical status, I feel that they are at least moderate risk at this time.  Time:   Today, I have spent 20 minutes directly with the patient on video discussing medical problems including afib, HTN, AS, lipids, HTN.  We also  reviewed the symptoms of COVID 19 and the ways to protect against contracting the virus with telehealth technology.  I spent an additional 5 minutes reviewing patient's chart including  2D echo, carotid dopplers, labs.  Medication Adjustments/Labs and Tests Ordered: Current medicines are reviewed at length with the patient today.  Concerns regarding medicines are outlined above.  Tests Ordered: No orders of the defined types were placed in this encounter.  Medication Changes: No orders of the defined types were placed in this encounter.   Disposition:  Follow up in 6 month(s)  Signed, Fransico Him, MD  01/23/2019 10:32 AM    [facility]

## 2019-01-26 ENCOUNTER — Telehealth: Payer: Self-pay | Admitting: *Deleted

## 2019-01-26 NOTE — Telephone Encounter (Signed)
Calling patient today to discuss upcoming appointment.  We are currently trying to limit exposure to the virus that causes COVID-19 by seeing patients at home rather than in the office. We would like to schedule this appointment as a Virtual Appointment VIA Smartphone or Laptop. Unable to reach patient.  LVMTCB  

## 2019-01-29 ENCOUNTER — Other Ambulatory Visit: Payer: Self-pay | Admitting: Endocrinology

## 2019-01-29 ENCOUNTER — Telehealth (INDEPENDENT_AMBULATORY_CARE_PROVIDER_SITE_OTHER): Payer: PPO | Admitting: Cardiology

## 2019-01-29 ENCOUNTER — Other Ambulatory Visit: Payer: Self-pay

## 2019-01-29 DIAGNOSIS — I4819 Other persistent atrial fibrillation: Secondary | ICD-10-CM | POA: Diagnosis not present

## 2019-01-29 NOTE — Telephone Encounter (Signed)
[email or web address]

## 2019-01-29 NOTE — Progress Notes (Signed)
Virtual Visit via Video Note   This visit type was conducted due to national recommendations for restrictions regarding the COVID-19 Pandemic (e.g. social distancing) in an effort to limit this patient's exposure and mitigate transmission in our community.  Due to her co-morbid illnesses, this patient is at least at moderate risk for complications without adequate follow up.  This format is felt to be most appropriate for this patient at this time.  All issues noted in this document were discussed and addressed.  A limited physical exam was performed with this format.  Please refer to the patient's chart for her consent to telehealth for [facility].   Date:  01/29/2019   ID:  Tina Hester, DOB 1941/02/06, MRN [id number]  Patient Location: Home Provider Location: Home  PCP:  Jonathon Jordan, MD  Cardiologist:  Fransico Him, MD  Electrophysiologist:  None   Evaluation Performed:  Consultation - Tina Hester was referred by Golden Hurter for the evaluation of atrial fibrillation.  Chief Complaint: Atrial fibrillation  History of Present Illness:    Tina Hester is a 78 y.o. female with a history significant for atrial fibrillation, hypertension, carotid artery disease, aortic stenosis, morbid obesity, OSA on CPAP.  Today, denies symptoms of palpitations, chest pain, shortness of breath, orthopnea, PND, lower extremity edema, claudication, dizziness, presyncope, syncope, bleeding, or neurologic sequela. The patient is tolerating medications without difficulties.  She is mostly unaware of her atrial fibrillation.  She says that she has had atrial fibrillation for so long that she was previously having symptoms of palpitations, weakness, and fatigue, but now she is feeling much improved without knowledge of AF.  The patient does not have symptoms concerning for COVID-19 infection (fever, chills, cough, or new shortness of breath).    Past Medical History:  Diagnosis Date   . Anemia    s/p Heme work -up normal EGD and colonscopy in 2012 per pt,neg SPEP  . Anxiety   . Aortic stenosis    very mild by echo 05/2017  . Bilateral carotid artery stenosis 06/08/2015   1-39% bilateral  . Bradycardia   . CKD (chronic kidney disease), stage III ([place])   . Diabetes mellitus without complication ([place])    type 2  . GERD (gastroesophageal reflux disease)   . Gout   . HSV-1 (herpes simplex virus 1) infection    Acyclovir prn  . Hyperkalemia   . Hyperlipidemia   . Hypertension   . Joint pain    osteoarthritis by Xray- possible erosion of R 4th MCP,elevated uric  acid ,ANA +   . Morbid obesity ([place])   . OSA on CPAP   . Osteopenia   . PAF (paroxysmal atrial fibrillation) ([place])   . Pain management    Neurosurg: Dr Clydell Hakim  . Pulmonary HTN ([place])    Moderate by echo  2016. PASP 39mmHg  . TIA (transient ischemic attack)    remote history of TIA's early 2000   Past Surgical History:  Procedure Laterality Date  . ABDOMINAL HYSTERECTOMY    . APPENDECTOMY    . BACK SURGERY     cervical fusion  . BLADDER SURGERY    . CESAREAN SECTION    . CHOLECYSTECTOMY       Current Meds  Medication Sig  . acetaminophen (TYLENOL) 325 MG tablet Take by mouth every 6 (six) hours as needed for mild pain or moderate pain.  Marland Kitchen acyclovir (ZOVIRAX) 400 MG tablet Take 400 mg by mouth 2 (two)  times daily.   Marland Kitchen ALPRAZolam (XANAX) 0.25 MG tablet Take 0.25 mg by mouth at bedtime as needed for anxiety.  Marland Kitchen amLODipine (NORVASC) 5 MG tablet Take 1 tablet (5 mg total) by mouth daily.  Marland Kitchen atorvastatin (LIPITOR) 40 MG tablet TAKE 1 TABLET(40 MG) BY MOUTH DAILY AT 6 PM  . CALCIUM PO Take 1 tablet by mouth daily. 600 mg  . Cholecalciferol 2000 units TABS Take 4,000 Units by mouth daily.  Marland Kitchen COLCRYS 0.6 MG tablet Take 0.6 mg by mouth daily as needed (gout).   Marland Kitchen ELIQUIS 5 MG TABS tablet TAKE 1 TABLET(5 MG) BY MOUTH TWICE DAILY (Patient taking differently: Take 5 mg by mouth 2 (two) times daily. )   . gabapentin (NEURONTIN) 100 MG capsule Take 100-200 mg by mouth at bedtime.   Marland Kitchen glucose blood (FREESTYLE LITE) test strip Use to test blood sugar twice daily. Dx: E11.9  . hydrochlorothiazide (HYDRODIURIL) 25 MG tablet TAKE 1 TABLET(25 MG) BY MOUTH DAILY  . hydrocortisone cream 1 % Apply 1 application topically daily as needed for itching.  . insulin NPH Human (HUMULIN N,NOVOLIN N) 100 UNIT/ML injection Inject 25 Units into the skin 2 (two) times daily before a meal. INJECT 25 UNITS UNDER THE SKIN TWICE DAILY WITH MEALS.  Marland Kitchen insulin regular (NOVOLIN R RELION) 100 units/mL injection Inject 0.06-0.1 mLs (6-10 Units total) into the skin 3 (three) times daily before meals. (Patient taking differently: Inject 6-10 Units into the skin 2 (two) times daily as needed (only when eating a big meal). )  . Insulin Syringe-Needle U-100 (B-D INS SYR ULTRAFINE 1CC/31G) 31G X 5/16" 1 ML MISC Use 3 times daily to inject insulin  . levothyroxine (SYNTHROID) 137 MCG tablet Take 137 mcg by mouth daily before breakfast.  . magnesium 30 MG tablet Take 30 mg by mouth at bedtime.   . metFORMIN (GLUCOPHAGE) 500 MG tablet TAKE 1 TABLET BY MOUTH EVERY MORNING AND 2 TABLETS BY MOUTH EVERY EVENING (Patient taking differently: Take 500-1,000 mg by mouth See admin instructions. TAKE 1 TABLET BY MOUTH EVERY MORNING AND 2 TABLETS BY MOUTH EVERY EVENING)  . metoprolol succinate (TOPROL-XL) 25 MG 24 hr tablet TAKE 1 TABLET BY MOUTH TWICE DAILY  . NEEDLE, DISP, 30 G (BD DISP NEEDLES) 30G X 1/2" MISC 1 each by Does not apply route 2 (two) times daily before a meal.  . ONETOUCH DELICA LANCETS 67M MISC Use to check blood sugars once daily  . pioglitazone (ACTOS) 15 MG tablet TAKE 1 TABLET BY MOUTH EVERY DAY  . prednisoLONE acetate (PRED FORTE) 1 % ophthalmic suspension Place 2 drops into the right eye 4 (four) times daily.   . timolol (TIMOPTIC) 0.5 % ophthalmic solution Place 1 drop into both eyes daily.   . vitamin B-12 (CYANOCOBALAMIN)  1000 MCG tablet Take 1,000 mcg by mouth daily.  . vitamin C (ASCORBIC ACID) 500 MG tablet Take 500 mg by mouth daily.     Allergies:   Pneumococcal vaccine; Pneumovax 23 [pneumococcal vac polyvalent]; Other; and Sulfa antibiotics   Social History   Tobacco Use  . Smoking status: Never Smoker  . Smokeless tobacco: Never Used  Substance Use Topics  . Alcohol use: No  . Drug use: No     Family Hx: The patient's family history includes Arrhythmia in her brother.  ROS:   Please see the history of present illness.     All other systems reviewed and are negative.   Prior CV studies:   The  following studies were reviewed today:  TTE 06/06/2017 - Left ventricle: The cavity size was normal. Wall thickness was   increased in a pattern of mild LVH. Systolic function was normal.   The estimated ejection fraction was in the range of 60% to 65%. - Aortic valve: AV is thickened, calcified with mildly restricted   motion. Peak and mean gradients through the valve are 25 and 15   mm Hg respectively. - Pericardium, extracardiac: A trivial pericardial effusion was   identified.  Labs/Other Tests and Data Reviewed:    EKG:  An ECG dated 06/15/18 was personally reviewed today and demonstrated:  Atrial fibrillation  Recent Labs: 02/02/2018: Magnesium 2.2 04/11/2018: Hemoglobin 11.2; Platelets 188 09/11/2018: ALT 14 12/13/2018: BUN 26; Creatinine, Ser 1.17; Potassium 4.1; Sodium 140; TSH 1.84   Recent Lipid Panel Lab Results  Component Value Date/Time   CHOL 114 01/10/2018 08:31 AM   TRIG 163.0 (H) 01/10/2018 08:31 AM   HDL 31.50 (L) 01/10/2018 08:31 AM   CHOLHDL 4 01/10/2018 08:31 AM   LDLCALC 50 01/10/2018 08:31 AM   LDLDIRECT 97.2 05/31/2013 09:42 AM    Wt Readings from Last 3 Encounters:  01/23/19 230 lb (104.3 kg)  09/14/18 238 lb (108 kg)  06/15/18 246 lb (111.6 kg)     Objective:    Vital Signs:  BP (!) 150/95 (BP Location: Left Arm)   Pulse 75    VITAL SIGNS:  reviewed  GEN:  no acute distress EYES:  sclerae anicteric, EOMI - Extraocular Movements Intact RESPIRATORY:  normal respiratory effort, symmetric expansion CARDIOVASCULAR:  no peripheral edema SKIN:  no rash, lesions or ulcers. MUSCULOSKELETAL:  no obvious deformities. NEURO:  alert and oriented x 3, no obvious focal deficit PSYCH:  normal affect  ASSESSMENT & PLAN:    1. Persistent atrial fibrillation: Patient has failed Multitak.  She is currently on Eliquis. I discussed with her the possibility of ablation.  Due to the fact that she is feeling well with medication changes recently done by Dr. Radford Pax, Shemar Plemmons hold off on ablation for now.  I Caren Garske see her back in 3 months to reassess. This patients CHA2DS2-VASc Score and unadjusted Ischemic Stroke Rate (% per year) is equal to 7.2 % stroke rate/year from a score of 5  Above score calculated as 1 point each if present [CHF, HTN, DM, Vascular=MI/PAD/Aortic Plaque, Age if 65-74, or Female] Above score calculated as 2 points each if present [Age > 75, or Stroke/TIA/TE]    2. Hypertension: Blood pressure is elevated today on her home cuff, but has been more normal recently in the past.  No changes. 3. OSA: CPAP compliance encouraged 4. Morbid obesity: Diet and exercise encouraged  COVID-19 Education: The signs and symptoms of COVID-19 were discussed with the patient and how to seek care for testing (follow up with PCP or arrange E-visit).  The importance of social distancing was discussed today.  Time:   Today, I have spent 16 minutes with the patient with telehealth technology discussing the above problems.     Medication Adjustments/Labs and Tests Ordered: Current medicines are reviewed at length with the patient today.  Concerns regarding medicines are outlined above.   Tests Ordered: No orders of the defined types were placed in this encounter.   Medication Changes: No orders of the defined types were placed in this encounter.    Disposition:  Follow up in 3 month(s)  Signed, Laylaa Guevarra Meredith Leeds, MD  01/29/2019 12:15 PM    [facility]

## 2019-01-29 NOTE — Telephone Encounter (Signed)
Spoke to pt who is agreeable to visit.     Virtual Visit Pre-Appointment Phone Call  "(Name), I am calling you today to discuss your upcoming appointment. We are currently trying to limit exposure to the virus that causes COVID-19 by seeing patients at home rather than in the office."  1. "What is the BEST phone number to call the day of the visit?" - include this in appointment notes  2. "Do you have or have access to (through a family member/friend) a smartphone with video capability that we can use for your visit?" a. If yes - list this number in appt notes as "cell" (if different from BEST phone #) and list the appointment type as a VIDEO visit in appointment notes b. If no - list the appointment type as a PHONE visit in appointment notes  3. Confirm consent - "In the setting of the current Covid19 crisis, you are scheduled for a (phone or video) visit with your provider on (date) at (time).  Just as we do with many in-office visits, in order for you to participate in this visit, we must obtain consent.  If you'd like, I can send this to your mychart (if signed up) or email for you to review.  Otherwise, I can obtain your verbal consent now.  All virtual visits are billed to your insurance company just like a normal visit would be.  By agreeing to a virtual visit, we'd like you to understand that the technology does not allow for your provider to perform an examination, and thus may limit your provider's ability to fully assess your condition. If your provider identifies any concerns that need to be evaluated in person, we will make arrangements to do so.  Finally, though the technology is pretty good, we cannot assure that it will always work on either your or our end, and in the setting of a video visit, we may have to convert it to a phone-only visit.  In either situation, we cannot ensure that we have a secure connection.  Are you willing to proceed?" STAFF: Did the patient verbally  acknowledge consent to telehealth visit? Document YES/NO here: YES  4. Advise patient to be prepared - "Two hours prior to your appointment, go ahead and check your blood pressure, pulse, oxygen saturation, and your weight (if you have the equipment to check those) and write them all down. When your visit starts, your provider will ask you for this information. If you have an Apple Watch or Kardia device, please plan to have heart rate information ready on the day of your appointment. Please have a pen and paper handy nearby the day of the visit as well."  5. Give patient instructions for MyChart download to smartphone OR Doximity/Doxy.me as below if video visit (depending on what platform provider is using)  6. Inform patient they will receive a phone call 15 minutes prior to their appointment time (may be from unknown caller ID) so they should be prepared to answer    TELEPHONE CALL NOTE  Tina Hester has been deemed a candidate for a follow-up tele-health visit to limit community exposure during the Covid-19 pandemic. I spoke with the patient via phone to ensure availability of phone/video source, confirm preferred email & phone number, and discuss instructions and expectations.  I reminded Tina Hester to be prepared with any vital sign and/or heart rhythm information that could potentially be obtained via home monitoring, at the time of her visit. I reminded  Tina Hester to expect a phone call prior to her visit.  Stanton Kidney, RN 01/29/2019 11:26 AM   INSTRUCTIONS FOR DOWNLOADING THE MYCHART APP TO SMARTPHONE  - The patient must first make sure to have activated MyChart and know their login information - If Apple, go to [employer] and type in MyChart in the search bar and download the app. If Android, ask patient to go to [employer] and type in [place] in the search bar and download the app. The app is free but as with any other app downloads, their phone may  require them to verify saved payment information or Apple/Android password.  - The patient will need to then log into the app with their MyChart username and password, and select [facility] as their healthcare provider to link the account. When it is time for your visit, go to the MyChart app, find appointments, and click Begin Video Visit. Be sure to Select Allow for your device to access the Microphone and Camera for your visit. You will then be connected, and your provider will be with you shortly.  **If they have any issues connecting, or need assistance please contact MyChart service desk [phone])**  **If using a computer, in order to ensure the best quality for their visit they will need to use either of the following Internet Browsers: [employer], or Google Chrome**  IF USING DOXIMITY or DOXY.ME - The patient will receive a link just prior to their visit by text.     FULL LENGTH CONSENT FOR TELE-HEALTH VISIT   I hereby voluntarily request, consent and authorize [place] and its employed or contracted physicians, physician assistants, nurse practitioners or other licensed health care professionals (the Practitioner), to provide me with telemedicine health care services (the "Services") as deemed necessary by the treating Practitioner. I acknowledge and consent to receive the Services by the Practitioner via telemedicine. I understand that the telemedicine visit will involve communicating with the Practitioner through live audiovisual communication technology and the disclosure of certain medical information by electronic transmission. I acknowledge that I have been given the opportunity to request an in-person assessment or other available alternative prior to the telemedicine visit and am voluntarily participating in the telemedicine visit.  I understand that I have the right to withhold or withdraw my consent to the use of telemedicine in the course of my care at  any time, without affecting my right to future care or treatment, and that the Practitioner or I may terminate the telemedicine visit at any time. I understand that I have the right to inspect all information obtained and/or recorded in the course of the telemedicine visit and may receive copies of available information for a reasonable fee.  I understand that some of the potential risks of receiving the Services via telemedicine include:  Marland Kitchen Delay or interruption in medical evaluation due to technological equipment failure or disruption; . Information transmitted may not be sufficient (e.g. poor resolution of images) to allow for appropriate medical decision making by the Practitioner; and/or  . In rare instances, security protocols could fail, causing a breach of personal health information.  Furthermore, I acknowledge that it is my responsibility to provide information about my medical history, conditions and care that is complete and accurate to the best of my ability. I acknowledge that Practitioner's advice, recommendations, and/or decision may be based on factors not within their control, such as incomplete or inaccurate data provided by me or distortions of  diagnostic images or specimens that may result from electronic transmissions. I understand that the practice of medicine is not an exact science and that Practitioner makes no warranties or guarantees regarding treatment outcomes. I acknowledge that I will receive a copy of this consent concurrently upon execution via email to the email address I last provided but may also request a printed copy by calling the office of [facility].    I understand that my insurance will be billed for this visit.   I have read or had this consent read to me. . I understand the contents of this consent, which adequately explains the benefits and risks of the Services being provided via telemedicine.  . I have been provided ample opportunity to ask questions  regarding this consent and the Services and have had my questions answered to my satisfaction. . I give my informed consent for the services to be provided through the use of telemedicine in my medical care  By participating in this telemedicine visit I agree to the above.

## 2019-01-30 ENCOUNTER — Telehealth: Payer: Self-pay | Admitting: *Deleted

## 2019-01-30 NOTE — Telephone Encounter (Signed)
-----   Message from Sarina Ill, RN sent at 01/23/2019 11:28 AM EDT ----- Need D/L.  Suezanne Jacquet

## 2019-01-30 NOTE — Telephone Encounter (Signed)
Reached out to patient to get a download and patient complained of having back pain and said she could not get to her dme for a download right now.

## 2019-02-01 DIAGNOSIS — M5126 Other intervertebral disc displacement, lumbar region: Secondary | ICD-10-CM | POA: Diagnosis not present

## 2019-02-05 ENCOUNTER — Telehealth: Payer: Self-pay | Admitting: *Deleted

## 2019-02-05 NOTE — Telephone Encounter (Signed)
Download received 02/05/19 from [facility].

## 2019-02-05 NOTE — Telephone Encounter (Signed)
-----   Message from Sarina Ill, RN sent at 01/23/2019 11:28 AM EDT ----- Need D/L.  Suezanne Jacquet

## 2019-02-06 DIAGNOSIS — Z6841 Body Mass Index (BMI) 40.0 and over, adult: Secondary | ICD-10-CM | POA: Diagnosis not present

## 2019-02-06 DIAGNOSIS — M47816 Spondylosis without myelopathy or radiculopathy, lumbar region: Secondary | ICD-10-CM | POA: Diagnosis not present

## 2019-02-06 DIAGNOSIS — M5136 Other intervertebral disc degeneration, lumbar region: Secondary | ICD-10-CM | POA: Diagnosis not present

## 2019-02-06 DIAGNOSIS — M5440 Lumbago with sciatica, unspecified side: Secondary | ICD-10-CM | POA: Diagnosis not present

## 2019-02-09 ENCOUNTER — Telehealth: Payer: Self-pay | Admitting: *Deleted

## 2019-02-09 DIAGNOSIS — G4733 Obstructive sleep apnea (adult) (pediatric): Secondary | ICD-10-CM

## 2019-02-09 DIAGNOSIS — I10 Essential (primary) hypertension: Secondary | ICD-10-CM

## 2019-02-09 NOTE — Telephone Encounter (Signed)
-----   Message from Sueanne Margarita, MD sent at 02/07/2019  7:05 PM EDT ----- AHI is too high - please get a 2 week autotitration from 4 to 18cm H2O and get a download in 2 weeks

## 2019-02-09 NOTE — Telephone Encounter (Signed)
Order placed to [facility] via community message. 

## 2019-02-12 NOTE — Telephone Encounter (Signed)
Tracee at [facility] says patients cpap is 78 years old and she can't change the pressure cause it is too old.  It is a 2012 model.She needs a new machine. Tracee will try to get her in the office to tweak her mask but ultimately she needs a new mask.

## 2019-02-12 NOTE — Telephone Encounter (Signed)
Please order a ResMed CPAP with heated humidity with mask of choice and auto titration from 4 to 20cm H2O.  Get a download in 2 weeks.  She will need a 10 week followup once she gets her device

## 2019-02-13 NOTE — Addendum Note (Signed)
Addended by: Freada Bergeron on: 02/13/2019 02:09 PM   Modules accepted: Orders

## 2019-02-13 NOTE — Telephone Encounter (Signed)
Order placed to [facility] via community message. 

## 2019-02-19 NOTE — Telephone Encounter (Signed)
[facility] called to set patient up with new cpap and the patient stated hat she has not been on cpap for twenty years and to cancel the order. [facility] called our office to make the report and cancelled the order.

## 2019-02-20 NOTE — Telephone Encounter (Signed)
Tina Hester did you call the wrong patient?  She has been on CPAP and she has a recent download in Epic under Media and we just ordered a new device

## 2019-02-20 NOTE — Telephone Encounter (Signed)
Yes, I know but Linus Orn the RT at [facility] told me this is what the patient said. The therapist called her to set her up with her new unit and this is what they called and told me the patient said.

## 2019-02-21 ENCOUNTER — Encounter (HOSPITAL_COMMUNITY): Payer: Self-pay

## 2019-02-22 ENCOUNTER — Other Ambulatory Visit: Payer: Self-pay | Admitting: Endocrinology

## 2019-02-27 ENCOUNTER — Ambulatory Visit (HOSPITAL_COMMUNITY)
Admission: RE | Admit: 2019-02-27 | Discharge: 2019-02-27 | Disposition: A | Discharge status: Home / Self Care | Payer: PPO | Source: Ambulatory Visit | Attending: Cardiology | Admitting: Cardiology

## 2019-02-27 ENCOUNTER — Other Ambulatory Visit: Payer: Self-pay

## 2019-02-27 DIAGNOSIS — I6523 Occlusion and stenosis of bilateral carotid arteries: Secondary | ICD-10-CM | POA: Diagnosis not present

## 2019-03-13 ENCOUNTER — Other Ambulatory Visit: Payer: Self-pay

## 2019-03-13 ENCOUNTER — Other Ambulatory Visit (INDEPENDENT_AMBULATORY_CARE_PROVIDER_SITE_OTHER): Payer: PPO

## 2019-03-13 DIAGNOSIS — Z794 Long term (current) use of insulin: Secondary | ICD-10-CM | POA: Diagnosis not present

## 2019-03-13 DIAGNOSIS — E1165 Type 2 diabetes mellitus with hyperglycemia: Secondary | ICD-10-CM

## 2019-03-13 DIAGNOSIS — E782 Mixed hyperlipidemia: Secondary | ICD-10-CM

## 2019-03-13 LAB — LIPID PANEL
Cholesterol: 140 mg/dL (ref 0–200)
HDL: 29.3 mg/dL — ABNORMAL LOW (ref >39.00)
NonHDL: 110.42
Total CHOL/HDL Ratio: 5
Triglycerides: 239 mg/dL — ABNORMAL HIGH (ref 0.0–149.0)
VLDL: 47.8 mg/dL — ABNORMAL HIGH (ref 0.0–40.0)

## 2019-03-13 LAB — COMPREHENSIVE METABOLIC PANEL
ALT: 13 U/L (ref 0–35)
AST: 14 U/L (ref 0–37)
Albumin: 3.9 g/dL (ref 3.5–5.2)
Alkaline Phosphatase: 57 U/L (ref 39–117)
BUN: 35 mg/dL — ABNORMAL HIGH (ref 6–23)
CO2: 31 mEq/L (ref 19–32)
Calcium: 10 mg/dL (ref 8.4–10.5)
Chloride: 101 mEq/L (ref 96–112)
Creatinine, Ser: 1.25 mg/dL — ABNORMAL HIGH (ref 0.40–1.20)
GFR: 41.43 mL/min — ABNORMAL LOW (ref >60.00)
Glucose, Bld: 131 mg/dL — ABNORMAL HIGH (ref 70–99)
Potassium: 4.9 mEq/L (ref 3.5–5.1)
Sodium: 139 mEq/L (ref 135–145)
Total Bilirubin: 0.5 mg/dL (ref 0.2–1.2)
Total Protein: 7.1 g/dL (ref 6.0–8.3)

## 2019-03-13 LAB — LDL CHOLESTEROL, DIRECT: Direct LDL: 80 mg/dL

## 2019-03-13 LAB — HEMOGLOBIN A1C: Hgb A1c MFr Bld: 7.7 % — ABNORMAL HIGH (ref 4.6–6.5)

## 2019-03-14 LAB — FRUCTOSAMINE: Fructosamine: 279 umol/L (ref 0–285)

## 2019-03-15 NOTE — Progress Notes (Signed)
Patient ID: Tina Hester, female   DOB: 05/13/1941, 78 y.o.   MRN: [id number]   Today's office visit was provided via telemedicine using video technique Explained to the patient and the the limitations of evaluation and management by telemedicine and the availability of in person appointments.  The patient understood the limitations and agreed to proceed. Patient also understood that the telehealth visit is billable. . Location of the patient: Home . Location of the provider: Office Only the patient and myself were participating in the encounter    Reason for Appointment: Diabetes follow-up   History of Present Illness   Diagnosis: Type 2 DIABETES MELITUS, long-standing    Previous history:  She has been on insulin for a few years. Because of cost she has been switched from Lantus to NPH insulin twice a day She had previously been taking metformin but this was stopped when she had renal insufficiency Overall her blood sugars have been difficult to control and she has previously had relatively poor control. However with adding Actos her sugars had started improving significantly Also with her being very consistent with diet in 2015 her A1c had gone down to 6.4 To improve her control she was given a trial of the V-go pump in 2/17 but she had allergic skin reaction with the adhesive  Recent history:    Insulin regimen: Humulin N 25 a.m., 25 HS; Novolin R 5 units prn  Oral hypoglycemic drugs: Actos 15 mg, Metformin 500 mg in a.m.-1000 mg   Her A1c is usually around 8% and is now 7.7  Current blood sugar patterns, management and problems identified:   Her meter was not downloaded and has a few readings both in the morning hours and some at bedtime  She continues to take mostly NPH insulin  She says that if she tries to take regular insulin even for large meals she may get shaky and usually does not take this  Also has only a few readings in the mornings recently  to review for before breakfast numbers  Lab glucose was 131 fasting  She tries to eat small portions usually and her sugars after dinner this month have been mostly fairly good, highest 163  She was previously eating eggs and toast and now is sometimes eating a granola bar and this may be making her sugars go up to as high as 202.  She also usually avoids cereal and milk in the morning because it causes her sugars to go up  Usually because of her being out of her home at mealtime she may need a small hamburger for [employer]  Otherwise tries to have some protein with most meals  She has significant limitation of activity because of her back pain  Not clear if she has lost weight recently but it was down as of 5/20                 Side effects from medications:She did not tolerate Invokana and was having muscle cramps  Monitors blood glucose: occasionally     Glucometer:  FreeStyle        Blood Glucose readings from patient reporting blood sugars from her meter   PRE-MEAL Fasting Lunch Dinner Bedtime Overall  Glucose range:  109, 115  149     Mean/median:     ?   POST-MEAL PC Breakfast PC Lunch PC Dinner  Glucose range:  202  171  145-163  Mean/median:  Meals: 2-3 meals per day.   Breakfast 9-10 a.m., dinner around 7-8 pm         Wt Readings from Last 3 Encounters:  01/23/19 230 lb (104.3 kg)  09/14/18 238 lb (108 kg)  06/15/18 246 lb (111.6 kg)   LABS:  Lab Results  Component Value Date   HGBA1C 7.7 (H) 03/13/2019   HGBA1C 8.0 (H) 12/13/2018   HGBA1C 7.8 (H) 09/11/2018   Lab Results  Component Value Date   MICROALBUR 2.9 (H) 12/13/2018   LDLCALC 50 01/10/2018   CREATININE 1.25 (H) 03/13/2019   Lab on 03/13/2019  Component Date Value Ref Range Status  . Cholesterol 03/13/2019 140  0 - 200 mg/dL Final   ATP III Classification       Desirable:  < 200 mg/dL               Borderline High:  200 - 239 mg/dL          High:  > = 240 mg/dL  . Triglycerides  03/13/2019 239.0* 0.0 - 149.0 mg/dL Final   Normal:  <150 mg/dLBorderline High:  150 - 199 mg/dL  . HDL 03/13/2019 29.30* >39.00 mg/dL Final  . VLDL 03/13/2019 47.8* 0.0 - 40.0 mg/dL Final  . Total CHOL/HDL Ratio 03/13/2019 5   Final                  Men          Women1/2 Average Risk     3.4          3.3Average Risk          5.0          4.42X Average Risk          9.6          7.13X Average Risk          15.0          11.0                      . NonHDL 03/13/2019 110.42   Final   NOTE:  Non-HDL goal should be 30 mg/dL higher than patient's LDL goal (i.e. LDL goal of < 70 mg/dL, would have non-HDL goal of < 100 mg/dL)  . Fructosamine 03/13/2019 279  0 - 285 umol/L Final   Comment: Published reference interval for apparently healthy subjects between age 36 and 39 is 58 - 285 umol/L and in a poorly controlled diabetic population is 228 - 563 umol/L with a mean of 396 umol/L.   Marland Kitchen Sodium 03/13/2019 139  135 - 145 mEq/L Final  . Potassium 03/13/2019 4.9  3.5 - 5.1 mEq/L Final  . Chloride 03/13/2019 101  96 - 112 mEq/L Final  . CO2 03/13/2019 31  19 - 32 mEq/L Final  . Glucose, Bld 03/13/2019 131* 70 - 99 mg/dL Final  . BUN 03/13/2019 35* 6 - 23 mg/dL Final  . Creatinine, Ser 03/13/2019 1.25* 0.40 - 1.20 mg/dL Final  . Total Bilirubin 03/13/2019 0.5  0.2 - 1.2 mg/dL Final  . Alkaline Phosphatase 03/13/2019 57  39 - 117 U/L Final  . AST 03/13/2019 14  0 - 37 U/L Final  . ALT 03/13/2019 13  0 - 35 U/L Final  . Total Protein 03/13/2019 7.1  6.0 - 8.3 g/dL Final  . Albumin 03/13/2019 3.9  3.5 - 5.2 g/dL Final  . Calcium 03/13/2019 10.0  8.4 - 10.5 mg/dL Final  .  GFR 03/13/2019 41.43* >60.00 mL/min Final  . Hgb A1c MFr Bld 03/13/2019 7.7* 4.6 - 6.5 % Final   Glycemic Control Guidelines for People with Diabetes:Non Diabetic:  <6%Goal of Therapy: <7%Additional Action Suggested:  >8%   . Direct LDL 03/13/2019 80.0  mg/dL Final   Optimal:  <100 mg/dLNear or Above Optimal:  100-129 mg/dLBorderline  High:  130-159 mg/dLHigh:  160-189 mg/dLVery High:  >190 mg/dL     Allergies as of 03/16/2019      Reactions   Pneumococcal Vaccine Anaphylaxis   weakness   Pneumovax 23 [pneumococcal Vac Polyvalent] Anaphylaxis   weakness   Other Hives   Other reaction(s): Other (See Comments) Pneumonia  Vaccine- very ill   Sulfa Antibiotics Hives      Medication List       Accurate as of March 16, 2019 11:59 PM. If you have any questions, ask your nurse or doctor.        acetaminophen 325 MG tablet Commonly known as: TYLENOL Take by mouth every 6 (six) hours as needed for mild pain or moderate pain.   acyclovir 400 MG tablet Commonly known as: ZOVIRAX Take 400 mg by mouth 2 (two) times daily.   ALPRAZolam 0.25 MG tablet Commonly known as: XANAX Take 0.25 mg by mouth at bedtime as needed for anxiety.   amLODipine 5 MG tablet Commonly known as: NORVASC Take 1 tablet (5 mg total) by mouth daily.   atorvastatin 40 MG tablet Commonly known as: LIPITOR TAKE 1 TABLET(40 MG) BY MOUTH DAILY AT 6 PM   CALCIUM PO Take 1 tablet by mouth daily. 600 mg   Cholecalciferol 50 MCG (2000 UT) Tabs Take 4,000 Units by mouth daily.   Colcrys 0.6 MG tablet Generic drug: colchicine Take 0.6 mg by mouth daily as needed (gout).   Eliquis 5 MG Tabs tablet Generic drug: apixaban TAKE 1 TABLET(5 MG) BY MOUTH TWICE DAILY What changed: See the new instructions.   gabapentin 100 MG capsule Commonly known as: NEURONTIN Take 100-200 mg by mouth at bedtime.   glucose blood test strip Commonly known as: FREESTYLE LITE Use to test blood sugar twice daily. Dx: E11.9   hydrochlorothiazide 25 MG tablet Commonly known as: HYDRODIURIL TAKE 1 TABLET(25 MG) BY MOUTH DAILY   hydrocortisone cream 1 % Apply 1 application topically daily as needed for itching.   insulin NPH Human 100 UNIT/ML injection Commonly known as: NOVOLIN N Inject 25 Units into the skin 2 (two) times daily before a meal. INJECT 25  UNITS UNDER THE SKIN TWICE DAILY WITH MEALS.   insulin regular 100 units/mL injection Commonly known as: NovoLIN R ReliOn Inject 0.06-0.1 mLs (6-10 Units total) into the skin 3 (three) times daily before meals. What changed:   when to take this  reasons to take this   INSULIN SYRINGE 1CC/31GX5/16" 31G X 5/16" 1 ML Misc USE THREE TIMES DAILY TO INJECT INSULIN   levothyroxine 137 MCG tablet Commonly known as: SYNTHROID Take 137 mcg by mouth daily before breakfast.   magnesium 30 MG tablet Take 30 mg by mouth at bedtime.   metFORMIN 500 MG tablet Commonly known as: GLUCOPHAGE TAKE 1 TABLET BY MOUTH EVERY MORNING AND 2 TABLETS BY MOUTH EVERY EVENING What changed:   how much to take  how to take this  when to take this   metoprolol succinate 25 MG 24 hr tablet Commonly known as: TOPROL-XL TAKE 1 TABLET BY MOUTH TWICE DAILY What changed:   when to take this  additional instructions   NEEDLE (DISP) 30 G 30G X 1/2" Misc Commonly known as: BD Disp Needles 1 each by Does not apply route 2 (two) times daily before a meal.   OneTouch Delica Lancets 38V Misc Use to check blood sugars once daily   pioglitazone 15 MG tablet Commonly known as: ACTOS TAKE 1 TABLET BY MOUTH EVERY DAY   prednisoLONE acetate 1 % ophthalmic suspension Commonly known as: PRED FORTE Place 2 drops into the right eye 4 (four) times daily.   timolol 0.5 % ophthalmic solution Commonly known as: TIMOPTIC Place 1 drop into both eyes daily.   vitamin B-12 1000 MCG tablet Commonly known as: CYANOCOBALAMIN Take 1,000 mcg by mouth daily.   vitamin C 500 MG tablet Commonly known as: ASCORBIC ACID Take 500 mg by mouth daily.       Allergies:  Allergies  Allergen Reactions  . Pneumococcal Vaccine Anaphylaxis    weakness  . Pneumovax 23 [Pneumococcal Vac Polyvalent] Anaphylaxis    weakness  . Other Hives    Other reaction(s): Other (See Comments) Pneumonia  Vaccine- very ill  . Sulfa  Antibiotics Hives    Past Medical History:  Diagnosis Date  . Anemia    s/p Heme work -up normal EGD and colonscopy in 2012 per pt,neg SPEP  . Anxiety   . Aortic stenosis    very mild by echo 05/2017  . Bilateral carotid artery stenosis 06/08/2015   1-39% bilateral  . Bradycardia   . CKD (chronic kidney disease), stage III ([place])   . Diabetes mellitus without complication ([place])    type 2  . GERD (gastroesophageal reflux disease)   . Gout   . HSV-1 (herpes simplex virus 1) infection    Acyclovir prn  . Hyperkalemia   . Hyperlipidemia   . Hypertension   . Joint pain    osteoarthritis by Xray- possible erosion of R 4th MCP,elevated uric  acid ,ANA +   . Morbid obesity ([place])   . OSA on CPAP   . Osteopenia   . PAF (paroxysmal atrial fibrillation) ([place])   . Pain management    Neurosurg: Dr Clydell Hakim  . Pulmonary HTN ([place])    Moderate by echo  2016. PASP 48mmHg  . TIA (transient ischemic attack)    remote history of TIA's early 2000    Past Surgical History:  Procedure Laterality Date  . ABDOMINAL HYSTERECTOMY    . APPENDECTOMY    . BACK SURGERY     cervical fusion  . BLADDER SURGERY    . CESAREAN SECTION    . CHOLECYSTECTOMY      Family History  Problem Relation Age of Onset  . Arrhythmia Brother     Social History:  reports that she has never smoked. She has never used smokeless tobacco. She reports that she does not drink alcohol or use drugs.  Review of Systems:   Hypertension:   This has been managed by her PCP or nephrologist She is taking amlodipine and HCTZ  BP Readings from Last 3 Encounters:  01/29/19 (!) 150/95  01/23/19 130/71  09/14/18 130/70   Lab Results  Component Value Date   CREATININE 1.25 (H) 03/13/2019   CREATININE 1.17 12/13/2018   CREATININE 1.22 (H) 09/11/2018     Lipids: Triglycerides have been high to a variable extent LDL has been below 100 with Lipitor 40 mg prescribed by her cardiologist  Lab Results  Component  Value Date   CHOL 140 03/13/2019  HDL 29.30 (L) 03/13/2019   LDLCALC 50 01/10/2018   LDLDIRECT 80.0 03/13/2019   TRIG 239.0 (H) 03/13/2019   CHOLHDL 5 03/13/2019    Renal insufficiency: Her creatinine is upper normal and stable, followed by nephrologist also Lisinopril caused hyperkalemia previously  Lab Results  Component Value Date   CREATININE 1.25 (H) 03/13/2019   CREATININE 1.17 12/13/2018   CREATININE 1.22 (H) 09/11/2018     She has hypothyroidism and is taking 137 mcg daily from her PCP  Last TSH was back to normal  She is regular with taking her supplement before breakfast  Lab Results  Component Value Date   TSH 1.84 12/13/2018   TSH 5.34 (H) 09/11/2018   TSH 1.20 01/10/2018   FREET4 1.15 12/13/2018   FREET4 1.52 01/10/2018   FREET4 1.24 07/03/2015     Prescribed gabapentin 100 mg as needed for mild burning and tingling      Examination:   There were no vitals taken for this visit.  There is no height or weight on file to calculate BMI.     ASSESSMENT/ PLAN:   Diabetes type 2 with obesity, insulin requiring:    See history of present illness for detailed discussion of  current management, blood sugar patterns and issues identified  Her A1c is slightly better at 7.7 Fructosamine is relatively better at 279  She still does not take enough readings after meals and overall monitoring is sporadic also She will sometimes have higher readings such as with eating a high carbohydrate breakfast which is not being monitored Not clear if her weight is coming down but she is generally inactive  Today discussed improving her A1c further down to 7% or below She will need to check her sugars in the morning more regularly to help adjust her bedtime NPH Also check a reading after meals at least every other day on a regular basis by rotation  Currently with no consistent pattern no changes in her insulin regimen can be made She has been reluctant to take  regular insulin because of reported symptoms of hypoglycemia even at 5 units at dinnertime However discussed that she can try combination of 22 NPH and 4 regular insulin in the morning when eating carbohydrate such as granola bars Encourage her to be as active as possible Does need to check more readings after meals especially if eating out and after breakfast if she is having any carbohydrate  Although she may benefit from [place] because of her high risk she is reluctant to take brand-name medications for cost  Discussed needing to check more readings after meals and she is eating high carbohydrate meals should take her regular insulin also We will also check her fructosamine on the next visit   RENAL dysfunction: Still has borderline high creatinine, no significant change Urine microalbumin normal in April and will be checked annually  HYPOTHYROIDISM: Well-controlled previously and will need follow-up on the next visit  LIPIDS: Well controlled with 40 mg Lipitor, will forward results to cardiologist  Will need reports of previous eye exam and to schedule follow-up  There are no Patient Instructions on file for this visit.   Total visit time for evaluation and management of multiple problems and counseling =25 minutes  Elayne Snare 03/17/2019, 3:28 PM

## 2019-03-16 ENCOUNTER — Ambulatory Visit (INDEPENDENT_AMBULATORY_CARE_PROVIDER_SITE_OTHER): Payer: PPO | Admitting: Endocrinology

## 2019-03-16 ENCOUNTER — Other Ambulatory Visit: Payer: Self-pay

## 2019-03-16 ENCOUNTER — Encounter: Payer: Self-pay | Admitting: Endocrinology

## 2019-03-16 DIAGNOSIS — N183 Chronic kidney disease, stage 3 unspecified: Secondary | ICD-10-CM

## 2019-03-16 DIAGNOSIS — E1165 Type 2 diabetes mellitus with hyperglycemia: Secondary | ICD-10-CM | POA: Diagnosis not present

## 2019-03-16 DIAGNOSIS — E063 Autoimmune thyroiditis: Secondary | ICD-10-CM

## 2019-03-16 DIAGNOSIS — Z794 Long term (current) use of insulin: Secondary | ICD-10-CM

## 2019-03-16 DIAGNOSIS — E782 Mixed hyperlipidemia: Secondary | ICD-10-CM

## 2019-03-21 ENCOUNTER — Encounter: Payer: Self-pay | Admitting: Podiatry

## 2019-03-21 ENCOUNTER — Other Ambulatory Visit: Payer: Self-pay

## 2019-03-21 ENCOUNTER — Ambulatory Visit: Payer: PPO | Admitting: Podiatry

## 2019-03-21 DIAGNOSIS — M79675 Pain in left toe(s): Secondary | ICD-10-CM | POA: Diagnosis not present

## 2019-03-21 DIAGNOSIS — M79674 Pain in right toe(s): Secondary | ICD-10-CM | POA: Diagnosis not present

## 2019-03-21 DIAGNOSIS — L84 Corns and callosities: Secondary | ICD-10-CM | POA: Diagnosis not present

## 2019-03-21 DIAGNOSIS — E119 Type 2 diabetes mellitus without complications: Secondary | ICD-10-CM

## 2019-03-21 DIAGNOSIS — Z794 Long term (current) use of insulin: Secondary | ICD-10-CM | POA: Diagnosis not present

## 2019-03-21 DIAGNOSIS — B351 Tinea unguium: Secondary | ICD-10-CM | POA: Diagnosis not present

## 2019-03-21 NOTE — Patient Instructions (Signed)
Diabetes Mellitus and Foot Care Foot care is an important part of your health, especially when you have diabetes. Diabetes may cause you to have problems because of poor blood flow (circulation) to your feet and legs, which can cause your skin to:  Become thinner and drier.  Break more easily.  Heal more slowly.  Peel and crack. You may also have nerve damage (neuropathy) in your legs and feet, causing decreased feeling in them. This means that you may not notice minor injuries to your feet that could lead to more serious problems. Noticing and addressing any potential problems early is the best way to prevent future foot problems. How to care for your feet Foot hygiene  Wash your feet daily with warm water and mild soap. Do not use hot water. Then, pat your feet and the areas between your toes until they are completely dry. Do not soak your feet as this can dry your skin.  Trim your toenails straight across. Do not dig under them or around the cuticle. File the edges of your nails with an emery board or nail file.  Apply a moisturizing lotion or petroleum jelly to the skin on your feet and to dry, brittle toenails. Use lotion that does not contain alcohol and is unscented. Do not apply lotion between your toes. Shoes and socks  Wear clean socks or stockings every day. Make sure they are not too tight. Do not wear knee-high stockings since they may decrease blood flow to your legs.  Wear shoes that fit properly and have enough cushioning. Always look in your shoes before you put them on to be sure there are no objects inside.  To break in new shoes, wear them for just a few hours a day. This prevents injuries on your feet. Wounds, scrapes, corns, and calluses  Check your feet daily for blisters, cuts, bruises, sores, and redness. If you cannot see the bottom of your feet, use a mirror or ask someone for help.  Do not cut corns or calluses or try to remove them with medicine.  If you  find a minor scrape, cut, or break in the skin on your feet, keep it and the skin around it clean and dry. You may clean these areas with mild soap and water. Do not clean the area with peroxide, alcohol, or iodine.  If you have a wound, scrape, corn, or callus on your foot, look at it several times a day to make sure it is healing and not infected. Check for: ? Redness, swelling, or pain. ? Fluid or blood. ? Warmth. ? Pus or a bad smell. General instructions  Do not cross your legs. This may decrease blood flow to your feet.  Do not use heating pads or hot water bottles on your feet. They may burn your skin. If you have lost feeling in your feet or legs, you may not know this is happening until it is too late.  Protect your feet from hot and cold by wearing shoes, such as at the beach or on hot pavement.  Schedule a complete foot exam at least once a year (annually) or more often if you have foot problems. If you have foot problems, report any cuts, sores, or bruises to your health care provider immediately. Contact a health care provider if:  You have a medical condition that increases your risk of infection and you have any cuts, sores, or bruises on your feet.  You have an injury that is not   healing.  You have redness on your legs or feet.  You feel burning or tingling in your legs or feet.  You have pain or cramps in your legs and feet.  Your legs or feet are numb.  Your feet always feel cold.  You have pain around a toenail. Get help right away if:  You have a wound, scrape, corn, or callus on your foot and: ? You have pain, swelling, or redness that gets worse. ? You have fluid or blood coming from the wound, scrape, corn, or callus. ? Your wound, scrape, corn, or callus feels warm to the touch. ? You have pus or a bad smell coming from the wound, scrape, corn, or callus. ? You have a fever. ? You have a red line going up your leg. Summary  Check your feet every day  for cuts, sores, red spots, swelling, and blisters.  Moisturize feet and legs daily.  Wear shoes that fit properly and have enough cushioning.  If you have foot problems, report any cuts, sores, or bruises to your health care provider immediately.  Schedule a complete foot exam at least once a year (annually) or more often if you have foot problems. This information is not intended to replace advice given to you by your health care provider. Make sure you discuss any questions you have with your health care provider. Document Released: 08/13/2000 Document Revised: 09/28/2017 Document Reviewed: 09/17/2016 Elsevier Patient Education  2020 [employer]   Onychomycosis/Fungal Toenails  WHAT IS IT? An infection that lies within the keratin of your nail plate that is caused by a fungus.  WHY ME? Fungal infections affect all ages, sexes, races, and creeds.  There may be many factors that predispose you to a fungal infection such as age, coexisting medical conditions such as diabetes, or an autoimmune disease; stress, medications, fatigue, genetics, etc.  Bottom line: fungus thrives in a warm, moist environment and your shoes offer such a location.  IS IT CONTAGIOUS? Theoretically, yes.  You do not want to share shoes, nail clippers or files with someone who has fungal toenails.  Walking around barefoot in the same room or sleeping in the same bed is unlikely to transfer the organism.  It is important to realize, however, that fungus can spread easily from one nail to the next on the same foot.  HOW DO WE TREAT THIS?  There are several ways to treat this condition.  Treatment may depend on many factors such as age, medications, pregnancy, liver and kidney conditions, etc.  It is best to ask your doctor which options are available to you.  1. No treatment.   Unlike many other medical concerns, you can live with this condition.  However for many people this can be a painful condition and may lead to  ingrown toenails or a bacterial infection.  It is recommended that you keep the nails cut short to help reduce the amount of fungal nail. 2. Topical treatment.  These range from herbal remedies to prescription strength nail lacquers.  About 40-50% effective, topicals require twice daily application for approximately 9 to 12 months or until an entirely new nail has grown out.  The most effective topicals are medical grade medications available through physicians offices. 3. Oral antifungal medications.  With an 80-90% cure rate, the most common oral medication requires 3 to 4 months of therapy and stays in your system for a year as the new nail grows out.  Oral antifungal medications do require   blood work to make sure it is a safe drug for you.  A liver function panel will be performed prior to starting the medication and after the first month of treatment.  It is important to have the blood work performed to avoid any harmful side effects.  In general, this medication safe but blood work is required. 4. Laser Therapy.  This treatment is performed by applying a specialized laser to the affected nail plate.  This therapy is noninvasive, fast, and non-painful.  It is not covered by insurance and is therefore, out of pocket.  The results have been very good with a 80-95% cure rate.  [facility] is the only practice in the area to offer this therapy. 5. Permanent Nail Avulsion.  Removing the entire nail so that a new nail will not grow back. 

## 2019-03-23 ENCOUNTER — Ambulatory Visit: Payer: PPO | Attending: Neurosurgery | Admitting: Physical Therapy

## 2019-03-23 ENCOUNTER — Encounter: Payer: Self-pay | Admitting: Physical Therapy

## 2019-03-23 ENCOUNTER — Other Ambulatory Visit: Payer: Self-pay

## 2019-03-23 DIAGNOSIS — M5441 Lumbago with sciatica, right side: Secondary | ICD-10-CM | POA: Insufficient documentation

## 2019-03-23 DIAGNOSIS — M5442 Lumbago with sciatica, left side: Secondary | ICD-10-CM | POA: Insufficient documentation

## 2019-03-23 DIAGNOSIS — G8929 Other chronic pain: Secondary | ICD-10-CM

## 2019-03-23 DIAGNOSIS — M6281 Muscle weakness (generalized): Secondary | ICD-10-CM

## 2019-03-23 DIAGNOSIS — R293 Abnormal posture: Secondary | ICD-10-CM | POA: Diagnosis not present

## 2019-03-23 NOTE — Patient Instructions (Signed)
Access Code: [email or web address]  URL: [email or web address]  Date: 03/23/2019  Prepared by: Venetia Night Oveda Dadamo   Exercises  Seated Hamstring Stretch - 3 reps - 2 sets - 20 hold - 1x daily - 7x weekly  Seated Flexion Stretch with Swiss Ball - 5 reps - 3 sets - 5 hold - 1x daily - 7x weekly  Seated Thoracic Flexion and Rotation with Swiss Ball - 5 reps - 3 sets - 5 hold - 1x daily - 7x weekly

## 2019-03-23 NOTE — Therapy (Signed)
[facility] [address] [place], [place], [address] Phone: [phone]   Fax:  [phone]  Physical Therapy Evaluation  Patient Details  Name: Tina Hester MRN: [id number] Date of Birth: [birth year] Referring Provider (PT): Jovita Gamma, MD   Encounter Date: 03/23/2019  PT End of Session - 03/23/19 1225    Visit Number  1    Date for PT Re-Evaluation  05/18/19    Authorization Type  [employer]    PT Start Time  0813   Pt arrived late   PT Stop Time  0850    PT Time Calculation (min)  37 min    Activity Tolerance  Patient limited by pain;Patient limited by fatigue    Behavior During Therapy  [facility] for tasks assessed/performed       Past Medical History:  Diagnosis Date  . Anemia    s/p Heme work -up normal EGD and colonscopy in 2012 per pt,neg SPEP  . Anxiety   . Aortic stenosis    very mild by echo 05/2017  . Bilateral carotid artery stenosis 06/08/2015   1-39% bilateral  . Bradycardia   . CKD (chronic kidney disease), stage III ([place])   . Diabetes mellitus without complication ([place])    type 2  . GERD (gastroesophageal reflux disease)   . Gout   . HSV-1 (herpes simplex virus 1) infection    Acyclovir prn  . Hyperkalemia   . Hyperlipidemia   . Hypertension   . Joint pain    osteoarthritis by Xray- possible erosion of R 4th MCP,elevated uric  acid ,ANA +   . Morbid obesity ([place])   . OSA on CPAP   . Osteopenia   . PAF (paroxysmal atrial fibrillation) ([place])   . Pain management    Neurosurg: Dr Clydell Hakim  . Pulmonary HTN ([place])    Moderate by echo  2016. PASP 73mmHg  . TIA (transient ischemic attack)    remote history of TIA's early 2000    Past Surgical History:  Procedure Laterality Date  . ABDOMINAL HYSTERECTOMY    . APPENDECTOMY    . BACK SURGERY     cervical fusion  . BLADDER SURGERY    . CESAREAN SECTION    . CHOLECYSTECTOMY      There were no vitals filed for  this visit.   Subjective Assessment - 03/23/19 0815    Subjective  Pt reports "tired back" and pain that has worsened over last 6 months.  Sometimes worse on the Rt side but can be on both sides.  Pt reports Rt foot numbness which has been present for about a year.  Has a hard time putting shoe on b/c can't feel heel.    Pertinent History  CHF, postural dizziness, Diabetic on insulin, on blood thinners    Limitations  Standing;Walking;House hold activities;Lifting    How long can you stand comfortably?  1 min, does about 10 min of household tasks and then has to take a break due to pain    How long can you walk comfortably?  household distances    Diagnostic tests  MRI May 2020, degenerative discs lumbar    Patient Stated Goals  get rid of the pain, improve tolerance of household tasks with less pain    Currently in Pain?  Yes    Pain Score  8     Pain Location  Back    Pain Orientation  Lower;Right;Left    Pain Descriptors /  Indicators  Sharp;Aching;Shooting;Penetrating    Pain Type  Chronic pain    Pain Radiating Towards  bil buttocks and lateral/posterior thighs    Pain Onset  More than a month ago    Pain Frequency  Constant    Aggravating Factors   standing, walking    Pain Relieving Factors  sitting, bending, Tylenol (takes edge off, can't take ibuprofen due to blood thinners), movement    Effect of Pain on Daily Activities  household tasks, walking household distances, community         [facility] PT Assessment - 03/23/19 0001      Assessment   Medical Diagnosis  M54.40 (ICD-10-CM) - Lumbago with sciatica, unspecified side    Referring Provider (PT)  Jovita Gamma, MD    Onset Date/Surgical Date  --   about 6-12 months ago   Hand Dominance  Right    Next MD Visit  05/02/19    Prior Therapy  home health PT after a fall      Precautions   Precautions  None      Restrictions   Weight Bearing Restrictions  No      Balance Screen   Has the patient fallen in the past 6  months  Yes    How many times?  1    Has the patient had a decrease in activity level because of a fear of falling?   Yes    Is the patient reluctant to leave their home because of a fear of falling?   No      Home Film/video editor residence    Living Arrangements  Children;Other relatives   adult daughter and her kids   Type of [place] to enter    Entrance Stairs-Number of Steps  3    Entrance Stairs-Rails  Right    [place] to live on main level with bedroom/bathroom    [place] - single point      Prior Function   Level of [place]  Retired    Leisure  spent time with grandkids, mind/mental games      Cognition   Overall Cognitive Status  Within Functional Limits for tasks assessed      Observation/Other Assessments   Observations  slow gait, SPC in Rt UE    Focus on Therapeutic Outcomes (FOTO)   64   goal 54     Sensation   Light Touch  Impaired by gross assessment   Rt heel     Functional Tests   Functional tests  Sit to Stand;Single leg stance      Single Leg Stance   Comments  unable to perform >1 sec without UEs      Sit to Stand   Comments  able to perform w/o UEs but slow      Posture/Postural Control   Posture/Postural Control  Postural limitations    Postural Limitations  Increased lumbar lordosis;Weight shift left    Posture Comments  stands with pelvis anterior to trunk      ROM / Strength   AROM / PROM / Strength  Strength;AROM      AROM   Overall AROM Comments  unable to assess hip ROM - Pt declined trying to transfer to treatment table    AROM Assessment Site  Lumbar    Lumbar Flexion  fingers to mid-shin, poor  reversal of lumbar lordosis    Lumbar Extension  5, pain    Lumbar - Right Side Bend  15, no pain    Lumbar - Left Side Bend  12, no pain      Strength   Overall Strength Comments  Lt hip flexor 3/5, Rt hip flexor 3+/5, bil knee  flexion 4-/5 bil, hip ext 3+/5 bil, hip abduct 3+/5 bil      Flexibility   Soft Tissue Assessment /Muscle Length  yes   unable to fully assess LE flexibility today   Hamstrings  limited by 30% bil   assessed seated     Transfers   Five time sit to stand comments   18 sec without UEs      Ambulation/Gait   Ambulation/Gait  Yes    Ambulation/Gait Assistance  6: Modified independent (Device/Increase time)    Ambulation Distance (Feet)  30 Feet    Assistive device  Straight cane    Gait Pattern  Step-through pattern    Gait Comments  wide stance, slow gait, uses cane intermittently for confidence due to past fall, easily winded                Objective measurements completed on examination: See above findings.              PT Education - 03/23/19 0848    Education Details  Access Code: [email or web address]    Person(s) Educated  Patient    Methods  Explanation;Demonstration;Verbal cues;Handout    Comprehension  Verbalized understanding;Returned demonstration       PT Short Term Goals - 03/23/19 1219      PT SHORT TERM GOAL #1   Title  Pt ind in initial HEP    Time  3    Period  Weeks    Status  New    Target Date  04/13/19      PT SHORT TERM GOAL #2   Title  Pt will report reduction in pain by at least 20%    Time  4    Period  Weeks    Status  New    Target Date  04/20/19        PT Long Term Goals - 03/23/19 1220      PT LONG TERM GOAL #1   Title  Pt will be ind in advanced HEP and understand how to safely progress.    Time  8    Period  Weeks    Status  New    Target Date  05/18/19      PT LONG TERM GOAL #2   Title  Pt will reduce FOTO to </= 54% to demo less limitation.    Time  8    Period  Weeks    Status  New    Target Date  05/18/19      PT LONG TERM GOAL #3   Title  Pt will be able to tolerate at least 20 min of standing household tasks before needing a seated break.    Time  8    Period  Weeks    Status  New    Target Date  05/18/19       PT LONG TERM GOAL #4   Title  Pt will be able to perform sit to stand and bed mobility transfers (sit to SL, SL to supine, and reverse) with good use of deep core to protect from injury and pain.    Time  8  Period  Weeks    Target Date  05/18/19      PT LONG TERM GOAL #5   Title  Pt will achieve at least 4+/5 strength throughout bil LEs to improve functional task performance such as transfers and stairs.    Time  8    Period  Weeks    Status  New    Target Date  05/18/19             Plan - 03/23/19 0859    Clinical Impression Statement  Pt is a pleasant 78yo female with complex past medical history and recent history of a fall.  She is referred to PT for LBP with sciatica.  She has started to use [facility] during community ambulation due to fear of falling.  She has diffuse LBP that radiates into bil buttocks and lateral/posterior thighs which has worsened over past 6 months.  She is limited in standing household activities to approx 10 min before needing a seated break due to fatigue and pain.  She lives with her adult daughter and grandkids.  She arrived late and history taking was lengthy so objective measures were not fully completed today due to lack of time.  She declined getting onto treatment table due to physical challenge of these transfers for her.  She will benefit from skilled PT for ROM, strength, endurance, gait training, flexibility to improve tolerance of daily tasks.    Personal Factors and Comorbidities  Age;Fitness;Comorbidity 1;Comorbidity 2;Comorbidity 3+    Comorbidities  obesity, CHF, insulin dependent diabetes    Examination-Activity Limitations  Squat;Stairs;Locomotion Level;Stand;Transfers    Examination-Participation Restrictions  Laundry;Cleaning;Shop;Community Activity;Meal Prep    Stability/Clinical Decision Making  Evolving/Moderate complexity    Clinical Decision Making  Moderate    Rehab Potential  Good    PT Frequency  2x / week    PT Duration  8  weeks    PT Treatment/Interventions  ADLs/Self Care Home Management;Cryotherapy;Electrical Stimulation;Iontophoresis 4mg /ml Dexamethasone;Moist Heat;Stair training;Gait training;Traction;Functional mobility training;Therapeutic activities;Therapeutic exercise;Balance training;Neuromuscular re-education;Patient/family education;Manual techniques;Passive range of motion;Dry needling;Energy conservation;Taping;Spinal Manipulations;Joint Manipulations    PT Next Visit Plan  try NuStep, f/u on HEP, review bed mobility and transfers, spine ROM, seated (and supine if can do transfers) trunk and glute isometrics    PT Home Exercise Plan  Access Code: [email or web address]    Consulted and Agree with Plan of Care  Patient       Patient will benefit from skilled therapeutic intervention in order to improve the following deficits and impairments:  Abnormal gait, Decreased balance, Decreased endurance, Decreased mobility, Difficulty walking, Hypomobility, Impaired sensation, Cardiopulmonary status limiting activity, Decreased range of motion, Improper body mechanics, Obesity, Decreased activity tolerance, Decreased strength, Impaired flexibility, Postural dysfunction, Pain  Visit Diagnosis: 1. Chronic bilateral low back pain with bilateral sciatica   2. Abnormal posture   3. Muscle weakness (generalized)        Problem List Patient Active Problem List   Diagnosis Date Noted  . Hematoma of lower leg 12/18/2018  . Low back pain 12/18/2018  . Contusion of left knee 06/16/2018  . Pain in left knee 06/16/2018  . Rib pain 06/16/2018  . Glaucoma suspect of left eye 12/22/2017  . Secondary open-angle glaucoma of right eye, indeterminate stage 12/22/2017  . Postural dizziness with presyncope 12/13/2017  . Chest pain 12/13/2017  . Hyperkalemia 12/13/2017  . Acute kidney injury ([place]) 12/13/2017  . Fluid overload 12/13/2017  . Bradycardia 12/13/2017  . CHF (congestive heart failure) ([place]) 12/13/2017  .  Aortic  stenosis 11/02/2016  . Abrasion of right cornea 04/12/2016  . Pulmonary HTN ([place]) 12/15/2015  . Bilateral carotid artery stenosis 06/08/2015  . Hypertension   . OSA on CPAP   . Persistent atrial fibrillation ([place])   . Morbid obesity ([place]) 09/04/2013  . Gout 09/04/2013  . DM2 (diabetes mellitus, type 2) ([place]) 05/29/2013  . Hyperlipidemia 05/29/2013  . Anxiety 01/31/2012  . CKD (chronic kidney disease), stage III ([place]) 01/31/2012  . Diabetes mellitus type 2 with complications, uncontrolled ([place]) 01/31/2012  . Hypothyroidism 01/31/2012  . TIA (transient ischemic attack) 01/31/2012    Baruch Merl, PT 03/23/19 12:32 PM   [facility] [address] [place], [place], [address] Phone: [phone]   Fax:  [phone]  Name: SHERESA CULLOP MRN: [id number] Date of Birth: [birth year]

## 2019-03-25 NOTE — Progress Notes (Signed)
Subjective: Tina Hester is a 78 y.o. y.o. female who is on long term blood thinner Eliquis and presents today with painful, discolored, thick toenails which interfere with daily activities. Pain is aggravated when wearing enclosed shoe gear. Pain is relieved with periodic professional debridement.  Pt also presents with painful callus formation noted b/l feet.   Current Outpatient Medications:  .  acetaminophen (TYLENOL) 325 MG tablet, Take by mouth every 6 (six) hours as needed for mild pain or moderate pain., Disp: , Rfl:  .  acyclovir (ZOVIRAX) 400 MG tablet, Take 400 mg by mouth 2 (two) times daily. , Disp: , Rfl:  .  ALPRAZolam (XANAX) 0.25 MG tablet, Take 0.25 mg by mouth at bedtime as needed for anxiety., Disp: , Rfl:  .  amLODipine (NORVASC) 5 MG tablet, Take 1 tablet (5 mg total) by mouth daily., Disp: 90 tablet, Rfl: 3 .  atorvastatin (LIPITOR) 40 MG tablet, TAKE 1 TABLET(40 MG) BY MOUTH DAILY AT 6 PM, Disp: 90 tablet, Rfl: 2 .  CALCIUM PO, Take 1 tablet by mouth daily. 600 mg, Disp: , Rfl:  .  Cholecalciferol 2000 units TABS, Take 4,000 Units by mouth daily., Disp: , Rfl:  .  COLCRYS 0.6 MG tablet, Take 0.6 mg by mouth daily as needed (gout). , Disp: , Rfl:  .  ELIQUIS 5 MG TABS tablet, TAKE 1 TABLET(5 MG) BY MOUTH TWICE DAILY (Patient taking differently: Take 5 mg by mouth 2 (two) times daily. ), Disp: 180 tablet, Rfl: 2 .  gabapentin (NEURONTIN) 100 MG capsule, Take 100-200 mg by mouth at bedtime. , Disp: , Rfl:  .  glucose blood (FREESTYLE LITE) test strip, Use to test blood sugar twice daily. Dx: E11.9, Disp: 200 each, Rfl: 1 .  hydrochlorothiazide (HYDRODIURIL) 25 MG tablet, TAKE 1 TABLET(25 MG) BY MOUTH DAILY, Disp: 90 tablet, Rfl: 2 .  hydrocortisone cream 1 %, Apply 1 application topically daily as needed for itching., Disp: , Rfl:  .  insulin NPH Human (HUMULIN N,NOVOLIN N) 100 UNIT/ML injection, Inject 25 Units into the skin 2 (two) times daily before a meal. INJECT 25  UNITS UNDER THE SKIN TWICE DAILY WITH MEALS., Disp: , Rfl:  .  insulin regular (NOVOLIN R RELION) 100 units/mL injection, Inject 0.06-0.1 mLs (6-10 Units total) into the skin 3 (three) times daily before meals. (Patient taking differently: Inject 6-10 Units into the skin 2 (two) times daily as needed (only when eating a big meal). ), Disp: 10 mL, Rfl: 1 .  Insulin Syringe-Needle U-100 (INSULIN SYRINGE 1CC/31GX5/16") 31G X 5/16" 1 ML MISC, USE THREE TIMES DAILY TO INJECT INSULIN, Disp: 100 each, Rfl: 0 .  levothyroxine (SYNTHROID) 137 MCG tablet, Take 137 mcg by mouth daily before breakfast., Disp: , Rfl:  .  magnesium 30 MG tablet, Take 30 mg by mouth at bedtime. , Disp: , Rfl:  .  metFORMIN (GLUCOPHAGE) 500 MG tablet, TAKE 1 TABLET BY MOUTH EVERY MORNING AND 2 TABLETS BY MOUTH EVERY EVENING (Patient taking differently: Take 500-1,000 mg by mouth See admin instructions. TAKE 1 TABLET BY MOUTH EVERY MORNING AND 2 TABLETS BY MOUTH EVERY EVENING), Disp: 270 tablet, Rfl: 3 .  metoprolol succinate (TOPROL-XL) 25 MG 24 hr tablet, TAKE 1 TABLET BY MOUTH TWICE DAILY (Patient taking differently: Take 25 mg by mouth daily. Take 2 tablets (5omg) total by mouth once daily in the morning.), Disp: 180 tablet, Rfl: 3 .  NEEDLE, DISP, 30 G (BD DISP NEEDLES) 30G X 1/2" MISC,  1 each by Does not apply route 2 (two) times daily before a meal., Disp: 100 each, Rfl: 5 .  ONETOUCH DELICA LANCETS 40J MISC, Use to check blood sugars once daily, Disp: 50 each, Rfl: 3 .  pioglitazone (ACTOS) 15 MG tablet, TAKE 1 TABLET BY MOUTH EVERY DAY, Disp: 90 tablet, Rfl: 0 .  prednisoLONE acetate (PRED FORTE) 1 % ophthalmic suspension, Place 2 drops into the right eye 4 (four) times daily. , Disp: , Rfl: 2 .  timolol (TIMOPTIC) 0.5 % ophthalmic solution, Place 1 drop into both eyes daily. , Disp: , Rfl: 1 .  vitamin B-12 (CYANOCOBALAMIN) 1000 MCG tablet, Take 1,000 mcg by mouth daily., Disp: , Rfl:  .  vitamin C (ASCORBIC ACID) 500 MG  tablet, Take 500 mg by mouth daily., Disp: , Rfl:    Allergies  Allergen Reactions  . Pneumococcal Vaccine Anaphylaxis    weakness  . Pneumovax 23 [Pneumococcal Vac Polyvalent] Anaphylaxis    weakness  . Other Hives    Other reaction(s): Other (See Comments) Pneumonia  Vaccine- very ill  . Sulfa Antibiotics Hives     Objective: Vascular Examination: Capillary refill time immediate x 10 digits.  Dorsalis pedis pulses palpable b/l.  Posterior tibial pulses palpable b/l.  No digital hair x 10 digits.  Skin temperature gradient WNL b/l.  Dermatological Examination: Skin with normal turgor, texture, and tone b/l.   Toenails 1-5 b/l discolored, thick, dystrophic with subungual debris and pain with palpation to nailbeds due to thickness of nails.  Hyperkeratotic lesion submet 1 b/l. No erythema, no edema, no drainage, no flocculence noted.   Musculoskeletal: Muscle strength 5/5 to all LE muscle groups.  HAV with bunion deformity b/l.  Dorsal eminence noted right midfoot.  Neurological: Sensation intact 5/5 b/l with 10 gram monofilament.  Vibratory sensation intact.  Assessment: Painful onychomycosis toenails 1-5 b/l in patient on blood thinner.  Callus submet head 1 b/l NIDDM  Plan: 1. Toenails 1-5 b/l were debrided in length and girth without iatrogenic bleeding. 2. Hyperkeratotic lesion submet head 1 b/l debrided utilizing sterile scalpel blade without incident.  3. Patient to continue soft, supportive shoe gear daily. 4. Patient to report any pedal injuries to medical professional immediately. 5. Avoid self trimming due to use of blood thinner. 6. Follow up 3 months. 7. Patient/POA to call should there be a concern in the interim.

## 2019-04-02 ENCOUNTER — Other Ambulatory Visit: Payer: Self-pay

## 2019-04-02 ENCOUNTER — Ambulatory Visit: Payer: PPO | Attending: Neurosurgery | Admitting: Physical Therapy

## 2019-04-02 ENCOUNTER — Encounter: Payer: Self-pay | Admitting: Physical Therapy

## 2019-04-02 DIAGNOSIS — M5442 Lumbago with sciatica, left side: Secondary | ICD-10-CM | POA: Diagnosis not present

## 2019-04-02 DIAGNOSIS — G8929 Other chronic pain: Secondary | ICD-10-CM

## 2019-04-02 DIAGNOSIS — R293 Abnormal posture: Secondary | ICD-10-CM | POA: Diagnosis not present

## 2019-04-02 DIAGNOSIS — M5441 Lumbago with sciatica, right side: Secondary | ICD-10-CM | POA: Diagnosis not present

## 2019-04-02 DIAGNOSIS — M6281 Muscle weakness (generalized): Secondary | ICD-10-CM | POA: Diagnosis not present

## 2019-04-02 NOTE — Therapy (Signed)
[facility] [address] [place], [place], [address] Phone: [phone]   Fax:  [phone]  Physical Therapy Treatment  Patient Details  Name: Tina Hester MRN: [id number] Date of Birth: 12/20/1940 Referring Provider (PT): Jovita Gamma, MD   Encounter Date: 04/02/2019  PT End of Session - 04/02/19 1400    Visit Number  2    Date for PT Re-Evaluation  05/18/19    Authorization Type  [employer]    PT Start Time  1400    PT Stop Time  1442    PT Time Calculation (min)  42 min    Activity Tolerance  Patient tolerated treatment well    Behavior During Therapy  [facility] for tasks assessed/performed       Past Medical History:  Diagnosis Date  . Anemia    s/p Heme work -up normal EGD and colonscopy in 2012 per pt,neg SPEP  . Anxiety   . Aortic stenosis    very mild by echo 05/2017  . Bilateral carotid artery stenosis 06/08/2015   1-39% bilateral  . Bradycardia   . CKD (chronic kidney disease), stage III ([place])   . Diabetes mellitus without complication ([place])    type 2  . GERD (gastroesophageal reflux disease)   . Gout   . HSV-1 (herpes simplex virus 1) infection    Acyclovir prn  . Hyperkalemia   . Hyperlipidemia   . Hypertension   . Joint pain    osteoarthritis by Xray- possible erosion of R 4th MCP,elevated uric  acid ,ANA +   . Morbid obesity ([place])   . OSA on CPAP   . Osteopenia   . PAF (paroxysmal atrial fibrillation) ([place])   . Pain management    Neurosurg: Dr Clydell Hakim  . Pulmonary HTN ([place])    Moderate by echo  2016. PASP 39mmHg  . TIA (transient ischemic attack)    remote history of TIA's early 2000    Past Surgical History:  Procedure Laterality Date  . ABDOMINAL HYSTERECTOMY    . APPENDECTOMY    . BACK SURGERY     cervical fusion  . BLADDER SURGERY    . CESAREAN SECTION    . CHOLECYSTECTOMY      There were no vitals filed for this visit.  Subjective Assessment -  04/02/19 1406    Subjective  With my Tylenol My pain is moderate.    Pertinent History  CHF, postural dizziness, Diabetic on insulin, on blood thinners    Limitations  Standing;Walking;House hold activities;Lifting    Currently in Pain?  Yes    Pain Score  6     Pain Location  --   Back to buttocks   Pain Orientation  Right;Left    Pain Descriptors / Indicators  Discomfort    Multiple Pain Sites  No                       [facility] Adult PT Treatment/Exercise - 04/02/19 0001      Lumbar Exercises: Stretches   Standing Side Bend  --   Seated 5x bil   Other Lumbar Stretch Exercise  Seated flexion stretch with blue ball 10x      Lumbar Exercises: Aerobic   Nustep   1 min 30 sec LE only      Knee/Hip Exercises: Standing   Hip Flexion  AROM;Stengthening;Both;1 set;10 reps;Knee bent    Hip Abduction  --   Toe taps out  to the side 10x     Knee/Hip Exercises: Seated   Long Arc Quad  AROM;Strengthening;Both;1 set;10 reps    Sit to [employer] 1 min x 2 sets seated              PT Short Term Goals - 03/23/19 1219      PT SHORT TERM GOAL #1   Title  Pt ind in initial HEP    Time  3    Period  Weeks    Status  New    Target Date  04/13/19      PT SHORT TERM GOAL #2   Title  Pt will report reduction in pain by at least 20%    Time  4    Period  Weeks    Status  New    Target Date  04/20/19        PT Long Term Goals - 03/23/19 1220      PT LONG TERM GOAL #1   Title  Pt will be ind in advanced HEP and understand how to safely progress.    Time  8    Period  Weeks    Status  New    Target Date  05/18/19      PT LONG TERM GOAL #2   Title  Pt will reduce FOTO to </= 54% to demo less limitation.    Time  8    Period  Weeks    Status  New    Target Date  05/18/19      PT LONG TERM GOAL #3   Title  Pt will be able to tolerate at least 20 min of standing household tasks before needing a seated break.    Time  8    Period  Weeks     Status  New    Target Date  05/18/19      PT LONG TERM GOAL #4   Title  Pt will be able to perform sit to stand and bed mobility transfers (sit to SL, SL to supine, and reverse) with good use of deep core to protect from injury and pain.    Time  8    Period  Weeks    Target Date  05/18/19      PT LONG TERM GOAL #5   Title  Pt will achieve at least 4+/5 strength throughout bil LEs to improve functional task performance such as transfers and stairs.    Time  8    Period  Weeks    Status  New    Target Date  05/18/19            Plan - 04/02/19 1409    Clinical Impression Statement  Pt arrives today with self reported moderate pain throughout her back and into her buttocks. She was sucessful on the Nustep for 1:30. She also is successful with chair exercises for gentle stretching, hip and knee AROM. Pt has a pool at her home. We discussed the possibliity of doing some pool walking holding onto 2 noodles for extra support in th epresence of her daughter. She will speak with her daughter about this. Pt had less fatigue with todays session.    Personal Factors and Comorbidities  Age;Fitness;Comorbidity 1;Comorbidity 2;Comorbidity 3+    Comorbidities  obesity, CHF, insulin dependent diabetes    Examination-Activity Limitations  Squat;Stairs;Locomotion Level;Stand;Transfers    Examination-Participation Restrictions  Laundry;Cleaning;Shop;Community Activity;Meal Prep    Rehab Potential  Good  PT Frequency  2x / week    PT Duration  8 weeks    PT Treatment/Interventions  ADLs/Self Care Home Management;Cryotherapy;Electrical Stimulation;Iontophoresis 4mg /ml Dexamethasone;Moist Heat;Stair training;Gait training;Traction;Functional mobility training;Therapeutic activities;Therapeutic exercise;Balance training;Neuromuscular re-education;Patient/family education;Manual techniques;Passive range of motion;Dry needling;Energy conservation;Taping;Spinal Manipulations;Joint Manipulations    PT Next  Visit Plan  Nustep, see what pt thinks about getting in the pool, continue with spine ROM and other chair exs for mobility.    PT Home Exercise Plan  Access Code: [email or web address]    Consulted and Agree with Plan of Care  Patient       Patient will benefit from skilled therapeutic intervention in order to improve the following deficits and impairments:  Abnormal gait, Decreased balance, Decreased endurance, Decreased mobility, Difficulty walking, Hypomobility, Impaired sensation, Cardiopulmonary status limiting activity, Decreased range of motion, Improper body mechanics, Obesity, Decreased activity tolerance, Decreased strength, Impaired flexibility, Postural dysfunction, Pain  Visit Diagnosis: 1. Chronic bilateral low back pain with bilateral sciatica   2. Abnormal posture   3. Muscle weakness (generalized)        Problem List Patient Active Problem List   Diagnosis Date Noted  . Hematoma of lower leg 12/18/2018  . Low back pain 12/18/2018  . Contusion of left knee 06/16/2018  . Pain in left knee 06/16/2018  . Rib pain 06/16/2018  . Glaucoma suspect of left eye 12/22/2017  . Secondary open-angle glaucoma of right eye, indeterminate stage 12/22/2017  . Postural dizziness with presyncope 12/13/2017  . Chest pain 12/13/2017  . Hyperkalemia 12/13/2017  . Acute kidney injury ([place]) 12/13/2017  . Fluid overload 12/13/2017  . Bradycardia 12/13/2017  . CHF (congestive heart failure) ([place]) 12/13/2017  . Aortic stenosis 11/02/2016  . Abrasion of right cornea 04/12/2016  . Pulmonary HTN ([place]) 12/15/2015  . Bilateral carotid artery stenosis 06/08/2015  . Hypertension   . OSA on CPAP   . Persistent atrial fibrillation ([place])   . Morbid obesity ([place]) 09/04/2013  . Gout 09/04/2013  . DM2 (diabetes mellitus, type 2) ([place]) 05/29/2013  . Hyperlipidemia 05/29/2013  . Anxiety 01/31/2012  . CKD (chronic kidney disease), stage III ([place]) 01/31/2012  . Diabetes mellitus type 2 with complications,  uncontrolled ([place]) 01/31/2012  . Hypothyroidism 01/31/2012  . TIA (transient ischemic attack) 01/31/2012    Vikram Tillett, PTA 04/02/2019, 2:45 PM  [facility] [address], [place], [place], [address] Phone: [phone]   Fax:  [phone]  Name: Tina Hester MRN: [id number] Date of Birth: 09-Mar-1941

## 2019-04-04 ENCOUNTER — Encounter: Payer: Self-pay | Admitting: Physical Therapy

## 2019-04-04 ENCOUNTER — Ambulatory Visit: Payer: PPO | Admitting: Physical Therapy

## 2019-04-04 ENCOUNTER — Other Ambulatory Visit: Payer: Self-pay

## 2019-04-04 DIAGNOSIS — G8929 Other chronic pain: Secondary | ICD-10-CM

## 2019-04-04 DIAGNOSIS — M5442 Lumbago with sciatica, left side: Secondary | ICD-10-CM | POA: Diagnosis not present

## 2019-04-04 DIAGNOSIS — M6281 Muscle weakness (generalized): Secondary | ICD-10-CM

## 2019-04-04 DIAGNOSIS — R293 Abnormal posture: Secondary | ICD-10-CM

## 2019-04-04 NOTE — Therapy (Signed)
[facility] [address] [place], [place], [address] Phone: [phone]   Fax:  [phone]  Physical Therapy Treatment  Patient Details  Name: Tina Hester MRN: [id number] Date of Birth: [birth year] Referring Provider (PT): Jovita Gamma, MD   Encounter Date: 04/04/2019  PT End of Session - 04/04/19 1500    Visit Number  3    Date for PT Re-Evaluation  05/18/19    Authorization Type  [employer]    PT Start Time  1500   Pt 15 min late   PT Stop Time  1530    PT Time Calculation (min)  30 min    Activity Tolerance  Patient tolerated treatment well    Behavior During Therapy  [facility] for tasks assessed/performed       Past Medical History:  Diagnosis Date  . Anemia    s/p Heme work -up normal EGD and colonscopy in 2012 per pt,neg SPEP  . Anxiety   . Aortic stenosis    very mild by echo 05/2017  . Bilateral carotid artery stenosis 06/08/2015   1-39% bilateral  . Bradycardia   . CKD (chronic kidney disease), stage III ([place])   . Diabetes mellitus without complication ([place])    type 2  . GERD (gastroesophageal reflux disease)   . Gout   . HSV-1 (herpes simplex virus 1) infection    Acyclovir prn  . Hyperkalemia   . Hyperlipidemia   . Hypertension   . Joint pain    osteoarthritis by Xray- possible erosion of R 4th MCP,elevated uric  acid ,ANA +   . Morbid obesity ([place])   . OSA on CPAP   . Osteopenia   . PAF (paroxysmal atrial fibrillation) ([place])   . Pain management    Neurosurg: Dr Clydell Hakim  . Pulmonary HTN ([place])    Moderate by echo  2016. PASP 58mmHg  . TIA (transient ischemic attack)    remote history of TIA's early 2000    Past Surgical History:  Procedure Laterality Date  . ABDOMINAL HYSTERECTOMY    . APPENDECTOMY    . BACK SURGERY     cervical fusion  . BLADDER SURGERY    . CESAREAN SECTION    . CHOLECYSTECTOMY      There were no vitals filed for this  visit.  Subjective Assessment - 04/04/19 1502    Subjective  I felt pretty good after last session than lasted about 24 hrs.                       [facility] Adult PT Treatment/Exercise - 04/04/19 0001      Lumbar Exercises: Stretches   Hip Flexor Stretch  --   Foot on first step 10x bil    Hip Flexor Stretch Limitations  VC for contracting the abs    Standing Side Bend  --   Seated 8x bil   Other Lumbar Stretch Exercise  Seated flexion stretch with blue ball 10x   feet on blue pods     Lumbar Exercises: Aerobic   Nustep  2:30, pt could get on the machine MUCH easier today than last session.       Lumbar Exercises: Seated   Long Arc Quad on Chair  Strengthening;Both;1 set;10 reps;Weights    LAQ on Chair Weights (lbs)  1    Other Seated Lumbar Exercises  Seated marching 1# 10x alternating      Knee/Hip Exercises: Standing  Hip Abduction  Stengthening;Both;1 set;15 reps;Knee straight               PT Short Term Goals - 03/23/19 1219      PT SHORT TERM GOAL #1   Title  Pt ind in initial HEP    Time  3    Period  Weeks    Status  New    Target Date  04/13/19      PT SHORT TERM GOAL #2   Title  Pt will report reduction in pain by at least 20%    Time  4    Period  Weeks    Status  New    Target Date  04/20/19        PT Long Term Goals - 03/23/19 1220      PT LONG TERM GOAL #1   Title  Pt will be ind in advanced HEP and understand how to safely progress.    Time  8    Period  Weeks    Status  New    Target Date  05/18/19      PT LONG TERM GOAL #2   Title  Pt will reduce FOTO to </= 54% to demo less limitation.    Time  8    Period  Weeks    Status  New    Target Date  05/18/19      PT LONG TERM GOAL #3   Title  Pt will be able to tolerate at least 20 min of standing household tasks before needing a seated break.    Time  8    Period  Weeks    Status  New    Target Date  05/18/19      PT LONG TERM GOAL #4   Title  Pt will be able  to perform sit to stand and bed mobility transfers (sit to SL, SL to supine, and reverse) with good use of deep core to protect from injury and pain.    Time  8    Period  Weeks    Target Date  05/18/19      PT LONG TERM GOAL #5   Title  Pt will achieve at least 4+/5 strength throughout bil LEs to improve functional task performance such as transfers and stairs.    Time  8    Period  Weeks    Status  New    Target Date  05/18/19            Plan - 04/04/19 1526    Clinical Impression Statement  Pt arrives today 15 min late, so tx was limited to 30 min. Pt in fact perfromed all her exercises either with greater ease, more reps, or more complex of a movement. No pain throughout the sesion today. Pt has spoken to her daughter about walking together in the pool but it has not happened yet.    Personal Factors and Comorbidities  Age;Fitness;Comorbidity 1;Comorbidity 2;Comorbidity 3+    Comorbidities  obesity, CHF, insulin dependent diabetes    Examination-Activity Limitations  Squat;Stairs;Locomotion Level;Stand;Transfers    Examination-Participation Restrictions  Laundry;Cleaning;Shop;Community Activity;Meal Prep    Stability/Clinical Decision Making  Evolving/Moderate complexity    Rehab Potential  Good    PT Frequency  2x / week    PT Duration  8 weeks    PT Treatment/Interventions  ADLs/Self Care Home Management;Cryotherapy;Electrical Stimulation;Iontophoresis 4mg /ml Dexamethasone;Moist Heat;Stair training;Gait training;Traction;Functional mobility training;Therapeutic activities;Therapeutic exercise;Balance training;Neuromuscular re-education;Patient/family education;Manual techniques;Passive range of motion;Dry needling;Energy conservation;Taping;Spinal  Manipulations;Joint Manipulations    PT Next Visit Plan  Nustep, see what pt thinks about getting in the pool, continue with spine ROM and other chair exs for mobility.    PT Home Exercise Plan  Access Code: [email or web address]    Consulted and  Agree with Plan of Care  Patient       Patient will benefit from skilled therapeutic intervention in order to improve the following deficits and impairments:  Abnormal gait, Decreased balance, Decreased endurance, Decreased mobility, Difficulty walking, Hypomobility, Impaired sensation, Cardiopulmonary status limiting activity, Decreased range of motion, Improper body mechanics, Obesity, Decreased activity tolerance, Decreased strength, Impaired flexibility, Postural dysfunction, Pain  Visit Diagnosis: 1. Chronic bilateral low back pain with bilateral sciatica   2. Abnormal posture   3. Muscle weakness (generalized)        Problem List Patient Active Problem List   Diagnosis Date Noted  . Hematoma of lower leg 12/18/2018  . Low back pain 12/18/2018  . Contusion of left knee 06/16/2018  . Pain in left knee 06/16/2018  . Rib pain 06/16/2018  . Glaucoma suspect of left eye 12/22/2017  . Secondary open-angle glaucoma of right eye, indeterminate stage 12/22/2017  . Postural dizziness with presyncope 12/13/2017  . Chest pain 12/13/2017  . Hyperkalemia 12/13/2017  . Acute kidney injury ([place]) 12/13/2017  . Fluid overload 12/13/2017  . Bradycardia 12/13/2017  . CHF (congestive heart failure) ([place]) 12/13/2017  . Aortic stenosis 11/02/2016  . Abrasion of right cornea 04/12/2016  . Pulmonary HTN ([place]) 12/15/2015  . Bilateral carotid artery stenosis 06/08/2015  . Hypertension   . OSA on CPAP   . Persistent atrial fibrillation ([place])   . Morbid obesity ([place]) 09/04/2013  . Gout 09/04/2013  . DM2 (diabetes mellitus, type 2) ([place]) 05/29/2013  . Hyperlipidemia 05/29/2013  . Anxiety 01/31/2012  . CKD (chronic kidney disease), stage III ([place]) 01/31/2012  . Diabetes mellitus type 2 with complications, uncontrolled ([place]) 01/31/2012  . Hypothyroidism 01/31/2012  . TIA (transient ischemic attack) 01/31/2012    COCHRAN,JENNIFER, PTA 04/04/2019, 3:34 PM  [facility] [address] [place], [place], [address] Phone: [phone]   Fax:  [phone]  Name: Tina Hester MRN: [id number] Date of Birth: 12/10/1940

## 2019-04-09 ENCOUNTER — Encounter: Payer: PPO | Admitting: Physical Therapy

## 2019-04-09 ENCOUNTER — Ambulatory Visit: Payer: PPO | Admitting: Physical Therapy

## 2019-04-11 ENCOUNTER — Other Ambulatory Visit: Payer: Self-pay

## 2019-04-11 ENCOUNTER — Ambulatory Visit: Payer: PPO | Admitting: Physical Therapy

## 2019-04-11 ENCOUNTER — Encounter: Payer: Self-pay | Admitting: Physical Therapy

## 2019-04-11 DIAGNOSIS — G8929 Other chronic pain: Secondary | ICD-10-CM

## 2019-04-11 DIAGNOSIS — R293 Abnormal posture: Secondary | ICD-10-CM

## 2019-04-11 DIAGNOSIS — M6281 Muscle weakness (generalized): Secondary | ICD-10-CM

## 2019-04-11 DIAGNOSIS — M5442 Lumbago with sciatica, left side: Secondary | ICD-10-CM | POA: Diagnosis not present

## 2019-04-11 NOTE — Patient Instructions (Signed)
Access Code: [email or web address]  URL: [email or web address]  Date: 04/11/2019  Prepared by: Venetia Night Beuhring   Exercises  Seated Hamstring Stretch - 3 reps - 2 sets - 20 hold - 1x daily - 7x weekly  Seated Flexion Stretch with Swiss Ball - 5 reps - 3 sets - 5 hold - 1x daily - 7x weekly  Seated Thoracic Flexion and Rotation with Swiss Ball - 5 reps - 3 sets - 5 hold - 1x daily - 7x weekly  Seated Long Arc Quad - 10 reps - 3 sets - 1x daily - 7x weekly  Seated March - 10 reps - 3 sets - 1x daily - 7x weekly  Seated Heel Toe Raises - 10 reps - 3 sets - 1x daily - 7x weekly  Seated Hip Adduction Isometrics with Ball - 10 reps - 3 sets - 1x daily - 7x weekly  Seated Hip Abduction with Resistance - 10 reps - 3 sets - 1x daily - 7x weekly

## 2019-04-11 NOTE — Therapy (Signed)
[facility] [address], [place], [place], [address] Phone: [phone]   Fax:  [phone]  Physical Therapy Treatment  Patient Details  Name: Tina Hester MRN: [id number] Date of Birth: [birth year] Referring Provider (PT): Jovita Gamma, MD   Encounter Date: 04/11/2019  PT End of Session - 04/11/19 1457    Visit Number  4    Date for PT Re-Evaluation  05/18/19    Authorization Type  [employer]    PT Start Time  1430   Pt 15 min late   PT Stop Time  1500    PT Time Calculation (min)  30 min    Activity Tolerance  Patient tolerated treatment well;Patient limited by pain    Behavior During Therapy  [facility] for tasks assessed/performed       Past Medical History:  Diagnosis Date  . Anemia    s/p Heme work -up normal EGD and colonscopy in 2012 per pt,neg SPEP  . Anxiety   . Aortic stenosis    very mild by echo 05/2017  . Bilateral carotid artery stenosis 06/08/2015   1-39% bilateral  . Bradycardia   . CKD (chronic kidney disease), stage III ([place])   . Diabetes mellitus without complication ([place])    type 2  . GERD (gastroesophageal reflux disease)   . Gout   . HSV-1 (herpes simplex virus 1) infection    Acyclovir prn  . Hyperkalemia   . Hyperlipidemia   . Hypertension   . Joint pain    osteoarthritis by Xray- possible erosion of R 4th MCP,elevated uric  acid ,ANA +   . Morbid obesity ([place])   . OSA on CPAP   . Osteopenia   . PAF (paroxysmal atrial fibrillation) ([place])   . Pain management    Neurosurg: Dr Clydell Hakim  . Pulmonary HTN ([place])    Moderate by echo  2016. PASP 66mmHg  . TIA (transient ischemic attack)    remote history of TIA's early 2000    Past Surgical History:  Procedure Laterality Date  . ABDOMINAL HYSTERECTOMY    . APPENDECTOMY    . BACK SURGERY     cervical fusion  . BLADDER SURGERY    . CESAREAN SECTION    . CHOLECYSTECTOMY      There were no vitals filed for  this visit.  Subjective Assessment - 04/11/19 1432    Subjective  Pt states she has been in a lot of pain since she was last here.  She thinks it was the hip stretch on the stairs.  "I can't walk across the rooms of my house without sitting down b/c of the pain."    Pertinent History  CHF, postural dizziness, Diabetic on insulin, on blood thinners    Limitations  Standing;Walking;House hold activities;Lifting    How long can you stand comfortably?  1 min, does about 10 min of household tasks and then has to take a break due to pain    How long can you walk comfortably?  household distances    Diagnostic tests  MRI May 2020, degenerative discs lumbar    Patient Stated Goals  get rid of the pain, improve tolerance of household tasks with less pain    Currently in Pain?  Yes    Pain Score  8     Pain Location  Back    Pain Orientation  Right;Left    Pain Descriptors / Indicators  Penetrating;Aching    Pain Type  Chronic pain    Pain Radiating Towards  bil buttocks and posterior thighs    Pain Onset  More than a month ago    Pain Frequency  Constant    Aggravating Factors   stand/walk    Pain Relieving Factors  sitting                       [facility] Adult PT Treatment/Exercise - 04/11/19 0001      Exercises   Exercises  Ankle      Lumbar Exercises: Seated   Other Seated Lumbar Exercises  UE press into foam roller with TrA cueing 10x5 sec hold      Knee/Hip Exercises: Seated   Long Arc Quad  Strengthening;20 reps;Both    Ball Squeeze  10, 3 sec hold, PT cued TrA    Clamshell with TheraBand  Yellow   15, PT cued Pt to sit tall away from back of chair    Marching  Right;Left;15 reps      Ankle Exercises: Seated   Other Seated Ankle Exercises  heel and toe raises             PT Education - 04/11/19 1453    Education Details  Access Code: [email or web address]    Person(s) Educated  Patient    Methods  Explanation;Demonstration;Verbal cues;Handout    Comprehension   Verbalized understanding;Returned demonstration       PT Short Term Goals - 04/11/19 1459      PT SHORT TERM GOAL #1   Title  Pt ind in initial HEP    Status  On-going        PT Long Term Goals - 03/23/19 1220      PT LONG TERM GOAL #1   Title  Pt will be ind in advanced HEP and understand how to safely progress.    Time  8    Period  Weeks    Status  New    Target Date  05/18/19      PT LONG TERM GOAL #2   Title  Pt will reduce FOTO to </= 54% to demo less limitation.    Time  8    Period  Weeks    Status  New    Target Date  05/18/19      PT LONG TERM GOAL #3   Title  Pt will be able to tolerate at least 20 min of standing household tasks before needing a seated break.    Time  8    Period  Weeks    Status  New    Target Date  05/18/19      PT LONG TERM GOAL #4   Title  Pt will be able to perform sit to stand and bed mobility transfers (sit to SL, SL to supine, and reverse) with good use of deep core to protect from injury and pain.    Time  8    Period  Weeks    Target Date  05/18/19      PT LONG TERM GOAL #5   Title  Pt will achieve at least 4+/5 strength throughout bil LEs to improve functional task performance such as transfers and stairs.    Time  8    Period  Weeks    Status  New    Target Date  05/18/19            Plan - 04/11/19 1457    Clinical Impression Statement  Pt  15 min late and non-compliant with HEP.  She reported increased pain since last visit.  She needed encouragement to participate in seated ther ex but noted improved pain level end of session.  PT built seated HEP strength and ROM program for her to try at home.  Pt going to follow up on a referral for a walker to improve walking/standing tolerance.  she will benefit from ongoing skilled PT with careful monitoring of response to treatment.    PT Frequency  2x / week    PT Duration  8 weeks    PT Treatment/Interventions  ADLs/Self Care Home Management;Cryotherapy;Electrical  Stimulation;Iontophoresis 4mg /ml Dexamethasone;Moist Heat;Stair training;Gait training;Traction;Functional mobility training;Therapeutic activities;Therapeutic exercise;Balance training;Neuromuscular re-education;Patient/family education;Manual techniques;Passive range of motion;Dry needling;Energy conservation;Taping;Spinal Manipulations;Joint Manipulations    PT Next Visit Plan  f/u on seated HEP, continue building endurance/strength with careful progression, f/u on did Pt get walker? in pool at home?    PT Home Exercise Plan  Access Code: [email or web address]    Consulted and Agree with Plan of Care  Patient       Patient will benefit from skilled therapeutic intervention in order to improve the following deficits and impairments:     Visit Diagnosis: 1. Chronic bilateral low back pain with bilateral sciatica   2. Abnormal posture   3. Muscle weakness (generalized)        Problem List Patient Active Problem List   Diagnosis Date Noted  . Hematoma of lower leg 12/18/2018  . Low back pain 12/18/2018  . Contusion of left knee 06/16/2018  . Pain in left knee 06/16/2018  . Rib pain 06/16/2018  . Glaucoma suspect of left eye 12/22/2017  . Secondary open-angle glaucoma of right eye, indeterminate stage 12/22/2017  . Postural dizziness with presyncope 12/13/2017  . Chest pain 12/13/2017  . Hyperkalemia 12/13/2017  . Acute kidney injury ([place]) 12/13/2017  . Fluid overload 12/13/2017  . Bradycardia 12/13/2017  . CHF (congestive heart failure) ([place]) 12/13/2017  . Aortic stenosis 11/02/2016  . Abrasion of right cornea 04/12/2016  . Pulmonary HTN ([place]) 12/15/2015  . Bilateral carotid artery stenosis 06/08/2015  . Hypertension   . OSA on CPAP   . Persistent atrial fibrillation ([place])   . Morbid obesity ([place]) 09/04/2013  . Gout 09/04/2013  . DM2 (diabetes mellitus, type 2) ([place]) 05/29/2013  . Hyperlipidemia 05/29/2013  . Anxiety 01/31/2012  . CKD (chronic kidney disease), stage III ([place])  01/31/2012  . Diabetes mellitus type 2 with complications, uncontrolled ([place]) 01/31/2012  . Hypothyroidism 01/31/2012  . TIA (transient ischemic attack) 01/31/2012    Baruch Merl, PT 04/11/19 3:00 PM   [facility] [address] [place], [place], [address] Phone: [phone]   Fax:  [phone]  Name: IMO CUMBIE MRN: [id number] Date of Birth: 1941/03/19

## 2019-04-16 ENCOUNTER — Ambulatory Visit: Payer: PPO | Admitting: Physical Therapy

## 2019-04-16 ENCOUNTER — Encounter: Payer: Self-pay | Admitting: Physical Therapy

## 2019-04-16 ENCOUNTER — Other Ambulatory Visit: Payer: Self-pay

## 2019-04-16 DIAGNOSIS — G8929 Other chronic pain: Secondary | ICD-10-CM

## 2019-04-16 DIAGNOSIS — M5442 Lumbago with sciatica, left side: Secondary | ICD-10-CM | POA: Diagnosis not present

## 2019-04-16 DIAGNOSIS — R293 Abnormal posture: Secondary | ICD-10-CM

## 2019-04-16 DIAGNOSIS — M6281 Muscle weakness (generalized): Secondary | ICD-10-CM

## 2019-04-16 NOTE — Therapy (Signed)
[facility] [address] [place], [place], [address] Phone: [phone]   Fax:  [phone]  Physical Therapy Treatment  Patient Details  Name: Tina Hester MRN: [id number] Date of Birth: 1941-04-22 Referring Provider (PT): Jovita Gamma, MD   Encounter Date: 04/16/2019  PT End of Session - 04/16/19 1457    Visit Number  5    Date for PT Re-Evaluation  05/18/19    Authorization Type  [employer]    PT Start Time  1455   10 min late   PT Stop Time  1526    PT Time Calculation (min)  31 min    Activity Tolerance  Patient tolerated treatment well    Behavior During Therapy  [facility] for tasks assessed/performed       Past Medical History:  Diagnosis Date  . Anemia    s/p Heme work -up normal EGD and colonscopy in 2012 per pt,neg SPEP  . Anxiety   . Aortic stenosis    very mild by echo 05/2017  . Bilateral carotid artery stenosis 06/08/2015   1-39% bilateral  . Bradycardia   . CKD (chronic kidney disease), stage III ([place])   . Diabetes mellitus without complication ([place])    type 2  . GERD (gastroesophageal reflux disease)   . Gout   . HSV-1 (herpes simplex virus 1) infection    Acyclovir prn  . Hyperkalemia   . Hyperlipidemia   . Hypertension   . Joint pain    osteoarthritis by Xray- possible erosion of R 4th MCP,elevated uric  acid ,ANA +   . Morbid obesity ([place])   . OSA on CPAP   . Osteopenia   . PAF (paroxysmal atrial fibrillation) ([place])   . Pain management    Neurosurg: Dr Clydell Hakim  . Pulmonary HTN ([place])    Moderate by echo  2016. PASP 53mmHg  . TIA (transient ischemic attack)    remote history of TIA's early 2000    Past Surgical History:  Procedure Laterality Date  . ABDOMINAL HYSTERECTOMY    . APPENDECTOMY    . BACK SURGERY     cervical fusion  . BLADDER SURGERY    . CESAREAN SECTION    . CHOLECYSTECTOMY      There were no vitals filed for this visit.  Subjective  Assessment - 04/16/19 1458    Subjective  Pt reminded PTA abouthte exercise that she feels hurt her back. No pool, no walker.    Pertinent History  CHF, postural dizziness, Diabetic on insulin, on blood thinners    Limitations  Standing;Walking;House hold activities;Lifting    Currently in Pain?  Yes   intermittent back pain. 0/10 right now.                      [facility] Adult PT Treatment/Exercise - 04/16/19 0001      Lumbar Exercises: Seated   Other Seated Lumbar Exercises  UE press into foam roller with TrA cueing 10x5 sec hold   VC to not hunch her shoulders when she presses down     Knee/Hip Exercises: Standing   Other Standing Knee Exercises  4 laps with RW ( 320 feet ) witthout stopping. Vc to use LE > UE       Knee/Hip Exercises: Seated   Long Arc Quad  Strengthening;Both;1 set;10 reps;Weights    Long Arc Quad Weight  1 lbs.    Ball Squeeze  5 sec hold 10x  Clamshell with TheraBand  Yellow   15x   Marching  AROM;Both;2 sets;10 reps               PT Short Term Goals - 04/11/19 1459      PT SHORT TERM GOAL #1   Title  Pt ind in initial HEP    Status  On-going        PT Long Term Goals - 03/23/19 1220      PT LONG TERM GOAL #1   Title  Pt will be ind in advanced HEP and understand how to safely progress.    Time  8    Period  Weeks    Status  New    Target Date  05/18/19      PT LONG TERM GOAL #2   Title  Pt will reduce FOTO to </= 54% to demo less limitation.    Time  8    Period  Weeks    Status  New    Target Date  05/18/19      PT LONG TERM GOAL #3   Title  Pt will be able to tolerate at least 20 min of standing household tasks before needing a seated break.    Time  8    Period  Weeks    Status  New    Target Date  05/18/19      PT LONG TERM GOAL #4   Title  Pt will be able to perform sit to stand and bed mobility transfers (sit to SL, SL to supine, and reverse) with good use of deep core to protect from injury and pain.     Time  8    Period  Weeks    Target Date  05/18/19      PT LONG TERM GOAL #5   Title  Pt will achieve at least 4+/5 strength throughout bil LEs to improve functional task performance such as transfers and stairs.    Time  8    Period  Weeks    Status  New    Target Date  05/18/19            Plan - 04/16/19 1457    Clinical Impression Statement  Pt has not attempted any pool walking, nor has she looked into getting a walker. She tolerates chair exercises well. Pt definitely fatigues with low level exercises. Pt ambulated 320 feet with RW before fatiguing and needing to sit. She mostly complained about her UE getting tired. PTA reminded pt to use her legs, core and back to advance the walker and less UE. PTA requested pt think about how she can add more walking into her day ( walking around house longer?) She agreed to think about it and report to PT next session her ideas.    Personal Factors and Comorbidities  Age;Fitness;Comorbidity 1;Comorbidity 2;Comorbidity 3+    Comorbidities  obesity, CHF, insulin dependent diabetes    Examination-Activity Limitations  Squat;Stairs;Locomotion Level;Stand;Transfers    Examination-Participation Restrictions  Laundry;Cleaning;Shop;Community Activity;Meal Prep    Stability/Clinical Decision Making  Evolving/Moderate complexity    Rehab Potential  Good    PT Frequency  2x / week    PT Duration  8 weeks    PT Treatment/Interventions  ADLs/Self Care Home Management;Cryotherapy;Electrical Stimulation;Iontophoresis 4mg /ml Dexamethasone;Moist Heat;Stair training;Gait training;Traction;Functional mobility training;Therapeutic activities;Therapeutic exercise;Balance training;Neuromuscular re-education;Patient/family education;Manual techniques;Passive range of motion;Dry needling;Energy conservation;Taping;Spinal Manipulations;Joint Manipulations    PT Next Visit Plan  Seated exercises, increase walking distance, see what ideas pt  came up with regarding how to  add more walking into her day/week.    PT Home Exercise Plan  Access Code: [email or web address]    Consulted and Agree with Plan of Care  Patient       Patient will benefit from skilled therapeutic intervention in order to improve the following deficits and impairments:  Abnormal gait, Decreased balance, Decreased endurance, Decreased mobility, Difficulty walking, Hypomobility, Impaired sensation, Cardiopulmonary status limiting activity, Decreased range of motion, Improper body mechanics, Obesity, Decreased activity tolerance, Decreased strength, Impaired flexibility, Postural dysfunction, Pain  Visit Diagnosis: 1. Chronic bilateral low back pain with bilateral sciatica   2. Abnormal posture   3. Muscle weakness (generalized)        Problem List Patient Active Problem List   Diagnosis Date Noted  . Hematoma of lower leg 12/18/2018  . Low back pain 12/18/2018  . Contusion of left knee 06/16/2018  . Pain in left knee 06/16/2018  . Rib pain 06/16/2018  . Glaucoma suspect of left eye 12/22/2017  . Secondary open-angle glaucoma of right eye, indeterminate stage 12/22/2017  . Postural dizziness with presyncope 12/13/2017  . Chest pain 12/13/2017  . Hyperkalemia 12/13/2017  . Acute kidney injury ([place]) 12/13/2017  . Fluid overload 12/13/2017  . Bradycardia 12/13/2017  . CHF (congestive heart failure) ([place]) 12/13/2017  . Aortic stenosis 11/02/2016  . Abrasion of right cornea 04/12/2016  . Pulmonary HTN ([place]) 12/15/2015  . Bilateral carotid artery stenosis 06/08/2015  . Hypertension   . OSA on CPAP   . Persistent atrial fibrillation ([place])   . Morbid obesity ([place]) 09/04/2013  . Gout 09/04/2013  . DM2 (diabetes mellitus, type 2) ([place]) 05/29/2013  . Hyperlipidemia 05/29/2013  . Anxiety 01/31/2012  . CKD (chronic kidney disease), stage III ([place]) 01/31/2012  . Diabetes mellitus type 2 with complications, uncontrolled ([place]) 01/31/2012  . Hypothyroidism 01/31/2012  . TIA (transient ischemic  attack) 01/31/2012    Sylvain Hasten, PTA 04/16/2019, 3:32 PM  [facility] [address], [place], [place], [address] Phone: [phone]   Fax:  [phone]  Name: Tina Hester MRN: [id number] Date of Birth: 06-17-1941

## 2019-04-18 ENCOUNTER — Encounter: Payer: PPO | Admitting: Physical Therapy

## 2019-04-19 ENCOUNTER — Telehealth: Payer: Self-pay | Admitting: Physical Therapy

## 2019-04-19 ENCOUNTER — Ambulatory Visit: Payer: PPO | Admitting: Physical Therapy

## 2019-04-19 NOTE — Telephone Encounter (Signed)
PT left message with Pt about missed appointment on Thurs 04/19/19 at 2:15.  Pt called right back and said she forgot about appointment and confirmed she will be at her next appt on 04/23/19 at 2:45pm. Baruch Merl, PT 04/19/19 2:46 PM

## 2019-04-23 ENCOUNTER — Encounter: Payer: Self-pay | Admitting: Physical Therapy

## 2019-04-23 ENCOUNTER — Other Ambulatory Visit: Payer: Self-pay

## 2019-04-23 ENCOUNTER — Ambulatory Visit: Payer: PPO | Admitting: Physical Therapy

## 2019-04-23 DIAGNOSIS — G8929 Other chronic pain: Secondary | ICD-10-CM

## 2019-04-23 DIAGNOSIS — M5442 Lumbago with sciatica, left side: Secondary | ICD-10-CM | POA: Diagnosis not present

## 2019-04-23 DIAGNOSIS — M6281 Muscle weakness (generalized): Secondary | ICD-10-CM

## 2019-04-23 DIAGNOSIS — R293 Abnormal posture: Secondary | ICD-10-CM

## 2019-04-23 DIAGNOSIS — M5441 Lumbago with sciatica, right side: Secondary | ICD-10-CM

## 2019-04-23 NOTE — Therapy (Signed)
[facility] [address] [place], [place], [address] Phone: [phone]   Fax:  [phone]  Physical Therapy Treatment  Patient Details  Name: Tina Hester MRN: [id number] Date of Birth: 26-May-1941 Referring Provider (PT): Jovita Gamma, MD   Encounter Date: 04/23/2019  PT End of Session - 04/23/19 1447    Visit Number  6    Date for PT Re-Evaluation  05/18/19    Authorization Type  [employer]    PT Start Time  1446    PT Stop Time  1524    PT Time Calculation (min)  38 min    Activity Tolerance  Patient tolerated treatment well;Patient limited by fatigue    Behavior During Therapy  [facility] for tasks assessed/performed       Past Medical History:  Diagnosis Date  . Anemia    s/p Heme work -up normal EGD and colonscopy in 2012 per pt,neg SPEP  . Anxiety   . Aortic stenosis    very mild by echo 05/2017  . Bilateral carotid artery stenosis 06/08/2015   1-39% bilateral  . Bradycardia   . CKD (chronic kidney disease), stage III ([place])   . Diabetes mellitus without complication ([place])    type 2  . GERD (gastroesophageal reflux disease)   . Gout   . HSV-1 (herpes simplex virus 1) infection    Acyclovir prn  . Hyperkalemia   . Hyperlipidemia   . Hypertension   . Joint pain    osteoarthritis by Xray- possible erosion of R 4th MCP,elevated uric  acid ,ANA +   . Morbid obesity ([place])   . OSA on CPAP   . Osteopenia   . PAF (paroxysmal atrial fibrillation) ([place])   . Pain management    Neurosurg: Dr Clydell Hakim  . Pulmonary HTN ([place])    Moderate by echo  2016. PASP 58mmHg  . TIA (transient ischemic attack)    remote history of TIA's early 2000    Past Surgical History:  Procedure Laterality Date  . ABDOMINAL HYSTERECTOMY    . APPENDECTOMY    . BACK SURGERY     cervical fusion  . BLADDER SURGERY    . CESAREAN SECTION    . CHOLECYSTECTOMY      There were no vitals filed for this  visit.  Subjective Assessment - 04/23/19 1449    Subjective  I walked inside the science center with my cane, then walked [place] pushing my son in his wheelchair. Pain when I walk or stand for long periods from my low back into my buttocks.    Pertinent History  CHF, postural dizziness, Diabetic on insulin, on blood thinners    Currently in Pain?  Yes    Pain Score  5     Pain Location  Back    Pain Orientation  Lower    Pain Descriptors / Indicators  Sore    Aggravating Factors   Standing and walking    Pain Relieving Factors  Sitting down    Multiple Pain Sites  No                       [facility] Adult PT Treatment/Exercise - 04/23/19 0001      Lumbar Exercises: Aerobic   Nustep  L1x 3:41 with a few short rest breaks for quad fatigue      Lumbar Exercises: Seated   Other Seated Lumbar Exercises  UE press into foam roller with TrA  cueing 10x5 sec hold   VC to not hunch her shoulders when she presses down   Other Seated Lumbar Exercises  Vc to push exhale out mouth to help facilitate her TvA       Knee/Hip Exercises: Standing   Other Standing Knee Exercises  5 laps  with RW, good posture 400 ft       Knee/Hip Exercises: Seated   Long Arc Quad  Strengthening;Both;1 set;10 reps;Weights    Long Arc Quad Weight  2 lbs.    Clamshell with TheraBand  Red   10x              PT Short Term Goals - 04/23/19 1513      PT SHORT TERM GOAL #1   Title  Pt ind in initial HEP    Time  3    Period  Weeks    Status  Achieved    Target Date  04/13/19        PT Long Term Goals - 03/23/19 1220      PT LONG TERM GOAL #1   Title  Pt will be ind in advanced HEP and understand how to safely progress.    Time  8    Period  Weeks    Status  New    Target Date  05/18/19      PT LONG TERM GOAL #2   Title  Pt will reduce FOTO to </= 54% to demo less limitation.    Time  8    Period  Weeks    Status  New    Target Date  05/18/19      PT LONG TERM GOAL #3   Title  Pt  will be able to tolerate at least 20 min of standing household tasks before needing a seated break.    Time  8    Period  Weeks    Status  New    Target Date  05/18/19      PT LONG TERM GOAL #4   Title  Pt will be able to perform sit to stand and bed mobility transfers (sit to SL, SL to supine, and reverse) with good use of deep core to protect from injury and pain.    Time  8    Period  Weeks    Target Date  05/18/19      PT LONG TERM GOAL #5   Title  Pt will achieve at least 4+/5 strength throughout bil LEs to improve functional task performance such as transfers and stairs.    Time  8    Period  Weeks    Status  New    Target Date  05/18/19            Plan - 04/23/19 1447    Clinical Impression Statement  Pt has recently tried increasingher walking at home. She walked the inside of the science center using a cane and then walked the [place] pushing her son's wheelchair. Pt ambulated in the clinic today with a RW 400 feet with mild fatigue. She also was able to use 2# wts on her LAQ exercise today without any pain in her leg.    Personal Factors and Comorbidities  Age;Fitness;Comorbidity 1;Comorbidity 2;Comorbidity 3+    Comorbidities  obesity, CHF, insulin dependent diabetes    Examination-Activity Limitations  Squat;Stairs;Locomotion Level;Stand;Transfers    Examination-Participation Restrictions  Laundry;Cleaning;Shop;Community Activity;Meal Prep    Stability/Clinical Decision Making  Evolving/Moderate complexity    Rehab Potential  Good  PT Frequency  2x / week    PT Duration  8 weeks    PT Treatment/Interventions  ADLs/Self Care Home Management;Cryotherapy;Electrical Stimulation;Iontophoresis 4mg /ml Dexamethasone;Moist Heat;Stair training;Gait training;Traction;Functional mobility training;Therapeutic activities;Therapeutic exercise;Balance training;Neuromuscular re-education;Patient/family education;Manual techniques;Passive range of motion;Dry needling;Energy  conservation;Taping;Spinal Manipulations;Joint Manipulations    PT Next Visit Plan  Maybe send MD note next as pt had to cancel her 9/2 appt. Trunk strength in standing? Consider standing rows/extension?    PT Home Exercise Plan  Access Code: [email or web address]    Consulted and Agree with Plan of Care  Patient       Patient will benefit from skilled therapeutic intervention in order to improve the following deficits and impairments:  Abnormal gait, Decreased balance, Decreased endurance, Decreased mobility, Difficulty walking, Hypomobility, Impaired sensation, Cardiopulmonary status limiting activity, Decreased range of motion, Improper body mechanics, Obesity, Decreased activity tolerance, Decreased strength, Impaired flexibility, Postural dysfunction, Pain  Visit Diagnosis: Chronic bilateral low back pain with bilateral sciatica  Abnormal posture  Muscle weakness (generalized)     Problem List Patient Active Problem List   Diagnosis Date Noted  . Hematoma of lower leg 12/18/2018  . Low back pain 12/18/2018  . Contusion of left knee 06/16/2018  . Pain in left knee 06/16/2018  . Rib pain 06/16/2018  . Glaucoma suspect of left eye 12/22/2017  . Secondary open-angle glaucoma of right eye, indeterminate stage 12/22/2017  . Postural dizziness with presyncope 12/13/2017  . Chest pain 12/13/2017  . Hyperkalemia 12/13/2017  . Acute kidney injury ([place]) 12/13/2017  . Fluid overload 12/13/2017  . Bradycardia 12/13/2017  . CHF (congestive heart failure) ([place]) 12/13/2017  . Aortic stenosis 11/02/2016  . Abrasion of right cornea 04/12/2016  . Pulmonary HTN ([place]) 12/15/2015  . Bilateral carotid artery stenosis 06/08/2015  . Hypertension   . OSA on CPAP   . Persistent atrial fibrillation ([place])   . Morbid obesity ([place]) 09/04/2013  . Gout 09/04/2013  . DM2 (diabetes mellitus, type 2) ([place]) 05/29/2013  . Hyperlipidemia 05/29/2013  . Anxiety 01/31/2012  . CKD (chronic kidney disease), stage III  ([place]) 01/31/2012  . Diabetes mellitus type 2 with complications, uncontrolled ([place]) 01/31/2012  . Hypothyroidism 01/31/2012  . TIA (transient ischemic attack) 01/31/2012    Tina Hester, Tina Hester 04/23/2019, 3:29 PM  [facility] [address] [place], [place], [address] Phone: [phone]   Fax:  [phone]  Name: Tina Hester MRN: [id number] Date of Birth: 01-18-1941

## 2019-04-25 ENCOUNTER — Encounter: Payer: Self-pay | Admitting: Physical Therapy

## 2019-04-25 ENCOUNTER — Other Ambulatory Visit: Payer: Self-pay

## 2019-04-25 ENCOUNTER — Ambulatory Visit: Payer: PPO | Admitting: Physical Therapy

## 2019-04-25 DIAGNOSIS — M6281 Muscle weakness (generalized): Secondary | ICD-10-CM

## 2019-04-25 DIAGNOSIS — M5442 Lumbago with sciatica, left side: Secondary | ICD-10-CM | POA: Diagnosis not present

## 2019-04-25 DIAGNOSIS — R293 Abnormal posture: Secondary | ICD-10-CM

## 2019-04-25 DIAGNOSIS — G8929 Other chronic pain: Secondary | ICD-10-CM

## 2019-04-25 NOTE — Therapy (Signed)
[facility] [address] [place], [place], [address] Phone: [phone]   Fax:  [phone]  Physical Therapy Treatment  Patient Details  Name: Tina [facility] MRN: [id number] Date of Birth: [birth year] Referring Provider (PT): Jovita Gamma, MD   Encounter Date: 04/25/2019  PT End of Session - 04/25/19 1423    Visit Number  7    Date for PT Re-Evaluation  05/18/19    Authorization Type  [employer]    PT Start Time  1420    PT Stop Time  1500    PT Time Calculation (min)  40 min    Activity Tolerance  Patient tolerated treatment well;Patient limited by fatigue    Behavior During Therapy  [facility] for tasks assessed/performed       Past Medical History:  Diagnosis Date  . Anemia    s/p Heme work -up normal EGD and colonscopy in 2012 per pt,neg SPEP  . Anxiety   . Aortic stenosis    very mild by echo 05/2017  . Bilateral carotid artery stenosis 06/08/2015   1-39% bilateral  . Bradycardia   . CKD (chronic kidney disease), stage III ([place])   . Diabetes mellitus without complication ([place])    type 2  . GERD (gastroesophageal reflux disease)   . Gout   . HSV-1 (herpes simplex virus 1) infection    Acyclovir prn  . Hyperkalemia   . Hyperlipidemia   . Hypertension   . Joint pain    osteoarthritis by Xray- possible erosion of R 4th MCP,elevated uric  acid ,ANA +   . Morbid obesity ([place])   . OSA on CPAP   . Osteopenia   . PAF (paroxysmal atrial fibrillation) ([place])   . Pain management    Neurosurg: Dr Clydell Hakim  . Pulmonary HTN ([place])    Moderate by echo  2016. PASP 64mmHg  . TIA (transient ischemic attack)    remote history of TIA's early 2000    Past Surgical History:  Procedure Laterality Date  . ABDOMINAL HYSTERECTOMY    . APPENDECTOMY    . BACK SURGERY     cervical fusion  . BLADDER SURGERY    . CESAREAN SECTION    . CHOLECYSTECTOMY      There were no vitals filed for this  visit.  Subjective Assessment - 04/25/19 1420    Subjective  I am really hurting today.  I have been active at home and needed to sit but didn't b/c I had things I needed to do.  Sitting gives me relief.  Tylenol hasn't helped pain today.  Pt requested to sit on arrival due to pain.  I see the MD on 9/2.    Limitations  Standing;Walking;House hold activities;Lifting    How long can you stand comfortably?  1 min, does about 10 min of household tasks and then has to take a break due to pain    How long can you walk comfortably?  household distances    Diagnostic tests  MRI May 2020, degenerative discs lumbar    Patient Stated Goals  get rid of the pain, improve tolerance of household tasks with less pain    Currently in Pain?  Yes    Pain Score  7     Pain Location  Back    Pain Orientation  Lower    Pain Descriptors / Indicators  Sore    Pain Type  Chronic pain    Pain Radiating Towards  bil buttocks and post thighs    Pain Onset  More than a month ago    Pain Frequency  Constant    Aggravating Factors   standing/walking    Pain Relieving Factors  sitting down    Effect of Pain on Daily Activities  all household tasks                       [facility] Adult PT Treatment/Exercise - 04/25/19 0001      Self-Care   Self-Care  Other Self-Care Comments   take more seated breaks to break up standing tasks     Neuro Re-ed    Neuro Re-ed Details   standing posture to avoid locking out lumbar spine, engage TrA, 2x30 sec   PT cued "zipping up" abdominals during all ther ex     Exercises   Exercises  Shoulder      Lumbar Exercises: Stretches   Other Lumbar Stretch Exercise  seated ball rollouts blue ball 5x flexion, Rt, Lt each      Lumbar Exercises: Seated   Long Arc Quad on Chair  Strengthening;Both;Weights    LAQ on Chair Weights (lbs)  2      Knee/Hip Exercises: Seated   Clamshell with TheraBand  Red   1x10, PT cued TrA   Marching  Strengthening;Both;2 sets;10 reps     Marching Weights  2 lbs.      Shoulder Exercises: Standing   Extension  Strengthening;10 reps;Theraband;Both    Extension Limitations  yellow, PT cued TrA "zipping up" and aligning pelvis and trunk in standing vs locking out low back    [employer];Theraband;Both;10 reps    Theraband Level (Shoulder Row)  Level 1 (Yellow)      Ankle Exercises: Seated   Other Seated Ankle Exercises  heel toe raises feet on black pad x 20 reps               PT Short Term Goals - 04/25/19 1456      PT SHORT TERM GOAL #1   Title  Pt ind in initial HEP    Status  Achieved      PT SHORT TERM GOAL #2   Title  Pt will report reduction in pain by at least 20%    Status  On-going        PT Long Term Goals - 04/25/19 1508      PT LONG TERM GOAL #1   Title  Pt will be ind in advanced HEP and understand how to safely progress.    Status  On-going      PT LONG TERM GOAL #3   Title  Pt will be able to tolerate at least 20 min of standing household tasks before needing a seated break.    Baseline  does 20 min but increases pain    Status  On-going      PT LONG TERM GOAL #4   Title  Pt will be able to perform sit to stand and bed mobility transfers (sit to SL, SL to supine, and reverse) with good use of deep core to protect from injury and pain.    Status  On-going      PT LONG TERM GOAL #5   Title  Pt will achieve at least 4+/5 strength throughout bil LEs to improve functional task performance such as transfers and stairs.    Status  On-going  Plan - 04/25/19 1457    Clinical Impression Statement  PT reminded Pt to look into filling rollator Rx to improve community activity options with seated option for breaks as needed for pain.  We discussed use of heat/ice at home and using seated breaks to break up standing household tasks to manage pain.  Pt was able to perform standing posture cueing with core and addition of yellow tband rows and shoulder extension today.  She  declined walking today due to pain and some dizziness.  Pt tends to lock out low back and when corrected she stated she felt off balance due to core weakness.  she did express awareness that her low back hurts less when she fixes posture.  Pt will continue to benefit from skilled PT along POC with encouragement to continue strength, walking, self-care.    PT Frequency  2x / week    PT Duration  8 weeks    PT Treatment/Interventions  ADLs/Self Care Home Management;Cryotherapy;Electrical Stimulation;Iontophoresis 4mg /ml Dexamethasone;Moist Heat;Stair training;Gait training;Traction;Functional mobility training;Therapeutic activities;Therapeutic exercise;Balance training;Neuromuscular re-education;Patient/family education;Manual techniques;Passive range of motion;Dry needling;Energy conservation;Taping;Spinal Manipulations;Joint Manipulations    PT Next Visit Plan  f/u on standing posture with core, yellow band standing rows and extension, seated strength, sit to stand with core, ball rollouts seated, gait as tol with walker, did Pt call about rollator?    PT Home Exercise Plan  Access Code: [email or web address]    Consulted and Agree with Plan of Care  Patient       Patient will benefit from skilled therapeutic intervention in order to improve the following deficits and impairments:     Visit Diagnosis: Chronic bilateral low back pain with bilateral sciatica  Abnormal posture  Muscle weakness (generalized)     Problem List Patient Active Problem List   Diagnosis Date Noted  . Hematoma of lower leg 12/18/2018  . Low back pain 12/18/2018  . Contusion of left knee 06/16/2018  . Pain in left knee 06/16/2018  . Rib pain 06/16/2018  . Glaucoma suspect of left eye 12/22/2017  . Secondary open-angle glaucoma of right eye, indeterminate stage 12/22/2017  . Postural dizziness with presyncope 12/13/2017  . Chest pain 12/13/2017  . Hyperkalemia 12/13/2017  . Acute kidney injury ([place]) 12/13/2017  . Fluid  overload 12/13/2017  . Bradycardia 12/13/2017  . CHF (congestive heart failure) ([place]) 12/13/2017  . Aortic stenosis 11/02/2016  . Abrasion of right cornea 04/12/2016  . Pulmonary HTN ([place]) 12/15/2015  . Bilateral carotid artery stenosis 06/08/2015  . Hypertension   . OSA on CPAP   . Persistent atrial fibrillation ([place])   . Morbid obesity ([place]) 09/04/2013  . Gout 09/04/2013  . DM2 (diabetes mellitus, type 2) ([place]) 05/29/2013  . Hyperlipidemia 05/29/2013  . Anxiety 01/31/2012  . CKD (chronic kidney disease), stage III ([place]) 01/31/2012  . Diabetes mellitus type 2 with complications, uncontrolled ([place]) 01/31/2012  . Hypothyroidism 01/31/2012  . TIA (transient ischemic attack) 01/31/2012   Baruch Merl, PT 04/25/19 3:10 PM   [facility] [address] [place], [place], [address] Phone: [phone]   Fax:  [phone]  Name: Tina [facility] MRN: [id number] Date of Birth: 1941/03/24

## 2019-04-30 ENCOUNTER — Encounter: Payer: Self-pay | Admitting: Physical Therapy

## 2019-04-30 ENCOUNTER — Ambulatory Visit: Payer: PPO | Admitting: Physical Therapy

## 2019-04-30 ENCOUNTER — Other Ambulatory Visit: Payer: Self-pay

## 2019-04-30 DIAGNOSIS — R293 Abnormal posture: Secondary | ICD-10-CM

## 2019-04-30 DIAGNOSIS — M6281 Muscle weakness (generalized): Secondary | ICD-10-CM

## 2019-04-30 DIAGNOSIS — M5442 Lumbago with sciatica, left side: Secondary | ICD-10-CM | POA: Diagnosis not present

## 2019-04-30 DIAGNOSIS — G8929 Other chronic pain: Secondary | ICD-10-CM

## 2019-04-30 NOTE — Therapy (Signed)
[facility] [address], [place], [place], [address] Phone: [phone]   Fax:  [phone]  Physical Therapy Treatment  Patient Details  Name: Tina Hester MRN: [id number] Date of Birth: [birth year] Referring Provider (PT): Jovita Gamma, MD   Encounter Date: 04/30/2019  PT End of Session - 04/30/19 1501    Visit Number  8    Date for PT Re-Evaluation  05/18/19    Authorization Type  [employer]    PT Start Time  1500    PT Stop Time  1531    PT Time Calculation (min)  31 min    Activity Tolerance  Patient tolerated treatment well;Patient limited by fatigue    Behavior During Therapy  [facility] for tasks assessed/performed       Past Medical History:  Diagnosis Date  . Anemia    s/p Heme work -up normal EGD and colonscopy in 2012 per pt,neg SPEP  . Anxiety   . Aortic stenosis    very mild by echo 05/2017  . Bilateral carotid artery stenosis 06/08/2015   1-39% bilateral  . Bradycardia   . CKD (chronic kidney disease), stage III ([place])   . Diabetes mellitus without complication ([place])    type 2  . GERD (gastroesophageal reflux disease)   . Gout   . HSV-1 (herpes simplex virus 1) infection    Acyclovir prn  . Hyperkalemia   . Hyperlipidemia   . Hypertension   . Joint pain    osteoarthritis by Xray- possible erosion of R 4th MCP,elevated uric  acid ,ANA +   . Morbid obesity ([place])   . OSA on CPAP   . Osteopenia   . PAF (paroxysmal atrial fibrillation) ([place])   . Pain management    Neurosurg: Dr Clydell Hakim  . Pulmonary HTN ([place])    Moderate by echo  2016. PASP 21mHg  . TIA (transient ischemic attack)    remote history of TIA's early 2000    Past Surgical History:  Procedure Laterality Date  . ABDOMINAL HYSTERECTOMY    . APPENDECTOMY    . BACK SURGERY     cervical fusion  . BLADDER SURGERY    . CESAREAN SECTION    . CHOLECYSTECTOMY      There were no vitals filed for this  visit.  Subjective Assessment - 04/30/19 1502    Subjective  When I am up on my feet a lot my back hurts worse. I would have used the scooter but they did not have one. 15 min late for appt today.    How long can you walk comfortably?  household distances    Diagnostic tests  MRI May 2020, degenerative discs lumbar    Currently in Pain?  Yes    Pain Score  5     Pain Location  Buttocks    Pain Orientation  Right;Left    Pain Descriptors / Indicators  Aching;Sore    Aggravating Factors   walking too much    Pain Relieving Factors  sitting down    Multiple Pain Sites  No         [facility] PT Assessment - 04/30/19 0001      Assessment   Medical Diagnosis  M54.40 (ICD-10-CM) - Lumbago with sciatica, unspecified side    Referring Provider (PT)  NJovita Gamma MD      Strength   Overall Strength Comments  Lt hip flexor 4-/5, Rt 4/5: RT quad 5/5, Lt 4/5, Rt hip  extension 4-/5, LT  4-/5                    [facility] Adult PT Treatment/Exercise - 04/30/19 0001      Lumbar Exercises: Stretches   Other Lumbar Stretch Exercise  seated ball rollouts blue ball 5x flexion, Rt, Lt each      Lumbar Exercises: Seated   Long Arc Quad on Chair  Strengthening;Both;Weights    LAQ on Chair Weights (lbs)  2    Other Seated Lumbar Exercises  UE press into foam roller with TrA cueing 10x5 sec hold   VC to not hunch her shoulders when she presses down     Knee/Hip Exercises: Seated   Marching  Strengthening;Both;1 set;20 reps;Weights    Marching Weights  2 lbs.      Shoulder Exercises: Standing   Extension  Strengthening;10 reps;Theraband;Both    Theraband Level (Shoulder Extension)  Level 1 (Yellow)    Row  Strengthening;Theraband;Both;10 reps    Theraband Level (Shoulder Row)  Level 1 (Yellow)   VC for  core contraction              PT Short Term Goals - 04/30/19 1505      PT SHORT TERM GOAL #1   Title  Pt ind in initial HEP    Time  3    Period  Weeks    Status   Achieved    Target Date  04/13/19      PT SHORT TERM GOAL #2   Title  Pt will report reduction in pain by at least 20%    Time  4    Period  Weeks    Status  On-going   At times my pain is less. If i can concentrate on my posture I do better.       PT Long Term Goals - 04/25/19 1508      PT LONG TERM GOAL #1   Title  Pt will be ind in advanced HEP and understand how to safely progress.    Status  On-going      PT LONG TERM GOAL #3   Title  Pt will be able to tolerate at least 20 min of standing household tasks before needing a seated break.    Baseline  does 20 min but increases pain    Status  On-going      PT LONG TERM GOAL #4   Title  Pt will be able to perform sit to stand and bed mobility transfers (sit to SL, SL to supine, and reverse) with good use of deep core to protect from injury and pain.    Status  On-going      PT LONG TERM GOAL #5   Title  Pt will achieve at least 4+/5 strength throughout bil LEs to improve functional task performance such as transfers and stairs.    Status  On-going            Plan - 04/30/19 1508    Clinical Impression Statement  Pt has been able to increase her activity and therefore her walking. Her pain "at times" is better but this is certainly not consistent. It has been recommended she get a walker to walk with to give her more lumbar support but pt has not done this yet. Pt's hip strength has shown improvement, see MMT in chart. No new goals have been met this week.    Personal Factors and Comorbidities  Age;Fitness;Comorbidity 1;Comorbidity 2;Comorbidity 3+  Comorbidities  obesity, CHF, insulin dependent diabetes    Examination-Activity Limitations  Squat;Stairs;Locomotion Level;Stand;Transfers    Examination-Participation Restrictions  Laundry;Cleaning;Shop;Community Activity;Meal Prep    Stability/Clinical Decision Making  Evolving/Moderate complexity    Rehab Potential  Good    PT Frequency  2x / week    PT Duration  8  weeks    PT Treatment/Interventions  ADLs/Self Care Home Management;Cryotherapy;Electrical Stimulation;Iontophoresis 67m/ml Dexamethasone;Moist Heat;Stair training;Gait training;Traction;Functional mobility training;Therapeutic activities;Therapeutic exercise;Balance training;Neuromuscular re-education;Patient/family education;Manual techniques;Passive range of motion;Dry needling;Energy conservation;Taping;Spinal Manipulations;Joint Manipulations    PT Next Visit Plan  See what MD says    Consulted and Agree with Plan of Care  Patient       Patient will benefit from skilled therapeutic intervention in order to improve the following deficits and impairments:  Abnormal gait, Decreased balance, Decreased endurance, Decreased mobility, Difficulty walking, Hypomobility, Impaired sensation, Cardiopulmonary status limiting activity, Decreased range of motion, Improper body mechanics, Obesity, Decreased activity tolerance, Decreased strength, Impaired flexibility, Postural dysfunction, Pain  Visit Diagnosis: Chronic bilateral low back pain with bilateral sciatica  Abnormal posture  Muscle weakness (generalized)     Problem List Patient Active Problem List   Diagnosis Date Noted  . Hematoma of lower leg 12/18/2018  . Low back pain 12/18/2018  . Contusion of left knee 06/16/2018  . Pain in left knee 06/16/2018  . Rib pain 06/16/2018  . Glaucoma suspect of left eye 12/22/2017  . Secondary open-angle glaucoma of right eye, indeterminate stage 12/22/2017  . Postural dizziness with presyncope 12/13/2017  . Chest pain 12/13/2017  . Hyperkalemia 12/13/2017  . Acute kidney injury ([place] 12/13/2017  . Fluid overload 12/13/2017  . Bradycardia 12/13/2017  . CHF (congestive heart failure) ([place] 12/13/2017  . Aortic stenosis 11/02/2016  . Abrasion of right cornea 04/12/2016  . Pulmonary HTN ([place] 12/15/2015  . Bilateral carotid artery stenosis 06/08/2015  . Hypertension   . OSA on CPAP   .  Persistent atrial fibrillation ([place]   . Morbid obesity ([place] 09/04/2013  . Gout 09/04/2013  . DM2 (diabetes mellitus, type 2) ([place] 05/29/2013  . Hyperlipidemia 05/29/2013  . Anxiety 01/31/2012  . CKD (chronic kidney disease), stage III ([place] 01/31/2012  . Diabetes mellitus type 2 with complications, uncontrolled ([place] 01/31/2012  . Hypothyroidism 01/31/2012  . TIA (transient ischemic attack) 01/31/2012    COCHRAN,JENNIFER, PTA 04/30/2019, 3:33 PM  [facility] [address] [place] [address]Phone: [phone]  Fax:  [phone] Name: RBERYLE BAGSBYMRN: [id number] of Birth: 503-11-1940

## 2019-05-02 ENCOUNTER — Encounter: Payer: PPO | Admitting: Physical Therapy

## 2019-05-02 ENCOUNTER — Ambulatory Visit: Payer: PPO | Admitting: Physical Therapy

## 2019-05-02 DIAGNOSIS — M47816 Spondylosis without myelopathy or radiculopathy, lumbar region: Secondary | ICD-10-CM | POA: Diagnosis not present

## 2019-05-02 DIAGNOSIS — M5136 Other intervertebral disc degeneration, lumbar region: Secondary | ICD-10-CM | POA: Diagnosis not present

## 2019-05-02 DIAGNOSIS — M5440 Lumbago with sciatica, unspecified side: Secondary | ICD-10-CM | POA: Diagnosis not present

## 2019-05-03 DIAGNOSIS — Z713 Dietary counseling and surveillance: Secondary | ICD-10-CM | POA: Diagnosis not present

## 2019-05-03 DIAGNOSIS — Z6841 Body Mass Index (BMI) 40.0 and over, adult: Secondary | ICD-10-CM | POA: Diagnosis not present

## 2019-05-09 ENCOUNTER — Ambulatory Visit: Payer: PPO | Attending: Neurosurgery | Admitting: Physical Therapy

## 2019-05-09 ENCOUNTER — Other Ambulatory Visit: Payer: Self-pay

## 2019-05-09 ENCOUNTER — Encounter: Payer: Self-pay | Admitting: Physical Therapy

## 2019-05-09 DIAGNOSIS — R293 Abnormal posture: Secondary | ICD-10-CM

## 2019-05-09 DIAGNOSIS — M5442 Lumbago with sciatica, left side: Secondary | ICD-10-CM | POA: Diagnosis not present

## 2019-05-09 DIAGNOSIS — G8929 Other chronic pain: Secondary | ICD-10-CM

## 2019-05-09 DIAGNOSIS — M6281 Muscle weakness (generalized): Secondary | ICD-10-CM | POA: Diagnosis not present

## 2019-05-09 DIAGNOSIS — M5441 Lumbago with sciatica, right side: Secondary | ICD-10-CM | POA: Insufficient documentation

## 2019-05-09 NOTE — Therapy (Signed)
[facility] [address] [place], [place], [address] Phone: [phone]   Fax:  [phone]  Physical Therapy Treatment  Patient Details  Name: Tina Hester MRN: [id number] Date of Birth: [birth year] Referring Provider (PT): Jovita Gamma, MD   Encounter Date: 05/09/2019  PT End of Session - 05/09/19 1453    Visit Number  9    Date for PT Re-Evaluation  05/18/19    Authorization Type  [employer]    PT Start Time  1453   Pt late   PT Stop Time  1526    PT Time Calculation (min)  33 min    Activity Tolerance  Patient tolerated treatment well    Behavior During Therapy  [facility] for tasks assessed/performed       Past Medical History:  Diagnosis Date  . Anemia    s/p Heme work -up normal EGD and colonscopy in 2012 per pt,neg SPEP  . Anxiety   . Aortic stenosis    very mild by echo 05/2017  . Bilateral carotid artery stenosis 06/08/2015   1-39% bilateral  . Bradycardia   . CKD (chronic kidney disease), stage III ([place])   . Diabetes mellitus without complication ([place])    type 2  . GERD (gastroesophageal reflux disease)   . Gout   . HSV-1 (herpes simplex virus 1) infection    Acyclovir prn  . Hyperkalemia   . Hyperlipidemia   . Hypertension   . Joint pain    osteoarthritis by Xray- possible erosion of R 4th MCP,elevated uric  acid ,ANA +   . Morbid obesity ([place])   . OSA on CPAP   . Osteopenia   . PAF (paroxysmal atrial fibrillation) ([place])   . Pain management    Neurosurg: Dr Clydell Hakim  . Pulmonary HTN ([place])    Moderate by echo  2016. PASP 53mmHg  . TIA (transient ischemic attack)    remote history of TIA's early 2000    Past Surgical History:  Procedure Laterality Date  . ABDOMINAL HYSTERECTOMY    . APPENDECTOMY    . BACK SURGERY     cervical fusion  . BLADDER SURGERY    . CESAREAN SECTION    . CHOLECYSTECTOMY      There were no vitals filed for this visit.  Subjective  Assessment - 05/09/19 1456    Subjective  Saw MD last week. He wants pt to loose weight for her back pain.    Pertinent History  CHF, postural dizziness, Diabetic on insulin, on blood thinners    Currently in Pain?  Yes    Pain Score  5     Pain Location  Buttocks    Pain Orientation  Right    Aggravating Factors   walking too much    Pain Relieving Factors  sitting down    Multiple Pain Sites  No                       [facility] Adult PT Treatment/Exercise - 05/09/19 0001      Lumbar Exercises: Aerobic   Nustep  L1 x 4 min       Lumbar Exercises: Seated   Other Seated Lumbar Exercises  Sidebend stretch Bil 5x 3 sec       Knee/Hip Exercises: Standing   Hip Flexion  AROM;Both;1 set;20 reps;Knee bent   2x10     Knee/Hip Exercises: Seated   Clamshell with Marga Hoots  2x10   Sit to Sand  1 set;2 sets;5 reps;without UE support   holding 3# weight              PT Short Term Goals - 04/30/19 1505      PT SHORT TERM GOAL #1   Title  Pt ind in initial HEP    Time  3    Period  Weeks    Status  Achieved    Target Date  04/13/19      PT SHORT TERM GOAL #2   Title  Pt will report reduction in pain by at least 20%    Time  4    Period  Weeks    Status  On-going   At times my pain is less. If i can concentrate on my posture I do better.       PT Long Term Goals - 04/25/19 1508      PT LONG TERM GOAL #1   Title  Pt will be ind in advanced HEP and understand how to safely progress.    Status  On-going      PT LONG TERM GOAL #3   Title  Pt will be able to tolerate at least 20 min of standing household tasks before needing a seated break.    Baseline  does 20 min but increases pain    Status  On-going      PT LONG TERM GOAL #4   Title  Pt will be able to perform sit to stand and bed mobility transfers (sit to SL, SL to supine, and reverse) with good use of deep core to protect from injury and pain.    Status  On-going      PT LONG TERM GOAL #5    Title  Pt will achieve at least 4+/5 strength throughout bil LEs to improve functional task performance such as transfers and stairs.    Status  On-going            Plan - 05/09/19 1454    Clinical Impression Statement  Pt saw MD last week where he advised her to lose weight for her back pain. She is in discussion with Dr Stephanie Acre regarding a shot that can possibly help with her insuin resistance. Pt talked for quite a bit at the beginning of session telling PTA about her MD visit.  Pt was able to do the Nustep for 4 min which is the longest she has been able to tolerate to date. Alternated between sitting and standing exercises today to work on her standing tolerance/strength.    Personal Factors and Comorbidities  Age;Fitness;Comorbidity 1;Comorbidity 2;Comorbidity 3+    Comorbidities  obesity, CHF, insulin dependent diabetes    Examination-Activity Limitations  Squat;Stairs;Locomotion Level;Stand;Transfers    Examination-Participation Restrictions  Laundry;Cleaning;Shop;Community Activity;Meal Prep    Stability/Clinical Decision Making  Evolving/Moderate complexity    Rehab Potential  Good    PT Frequency  2x / week    PT Duration  8 weeks    PT Treatment/Interventions  ADLs/Self Care Home Management;Cryotherapy;Electrical Stimulation;Iontophoresis 4mg /ml Dexamethasone;Moist Heat;Stair training;Gait training;Traction;Functional mobility training;Therapeutic activities;Therapeutic exercise;Balance training;Neuromuscular re-education;Patient/family education;Manual techniques;Passive range of motion;Dry needling;Energy conservation;Taping;Spinal Manipulations;Joint Manipulations    PT Next Visit Plan  Nustep, trunk strength, and lumbar /hip mobility.    PT Home Exercise Plan  Access Code: [email or web address]    Consulted and Agree with Plan of Care  Patient       Patient will benefit from skilled therapeutic intervention in order to improve the  following deficits and impairments:  Abnormal gait,  Decreased balance, Decreased endurance, Decreased mobility, Difficulty walking, Hypomobility, Impaired sensation, Cardiopulmonary status limiting activity, Decreased range of motion, Improper body mechanics, Obesity, Decreased activity tolerance, Decreased strength, Impaired flexibility, Postural dysfunction, Pain  Visit Diagnosis: Chronic bilateral low back pain with bilateral sciatica  Abnormal posture  Muscle weakness (generalized)     Problem List Patient Active Problem List   Diagnosis Date Noted  . Hematoma of lower leg 12/18/2018  . Low back pain 12/18/2018  . Contusion of left knee 06/16/2018  . Pain in left knee 06/16/2018  . Rib pain 06/16/2018  . Glaucoma suspect of left eye 12/22/2017  . Secondary open-angle glaucoma of right eye, indeterminate stage 12/22/2017  . Postural dizziness with presyncope 12/13/2017  . Chest pain 12/13/2017  . Hyperkalemia 12/13/2017  . Acute kidney injury ([place]) 12/13/2017  . Fluid overload 12/13/2017  . Bradycardia 12/13/2017  . CHF (congestive heart failure) ([place]) 12/13/2017  . Aortic stenosis 11/02/2016  . Abrasion of right cornea 04/12/2016  . Pulmonary HTN ([place]) 12/15/2015  . Bilateral carotid artery stenosis 06/08/2015  . Hypertension   . OSA on CPAP   . Persistent atrial fibrillation ([place])   . Morbid obesity ([place]) 09/04/2013  . Gout 09/04/2013  . DM2 (diabetes mellitus, type 2) ([place]) 05/29/2013  . Hyperlipidemia 05/29/2013  . Anxiety 01/31/2012  . CKD (chronic kidney disease), stage III ([place]) 01/31/2012  . Diabetes mellitus type 2 with complications, uncontrolled ([place]) 01/31/2012  . Hypothyroidism 01/31/2012  . TIA (transient ischemic attack) 01/31/2012    Jaramiah Bossard, PTA 05/09/2019, 3:30 PM  [facility] [address] [place], [place], [address] Phone: [phone]   Fax:  [phone]  Name: Tina Hester MRN: [id number] Date of Birth:  Jan 22, 1941

## 2019-05-14 ENCOUNTER — Encounter: Payer: Self-pay | Admitting: Physical Therapy

## 2019-05-14 ENCOUNTER — Other Ambulatory Visit: Payer: Self-pay

## 2019-05-14 ENCOUNTER — Ambulatory Visit: Payer: PPO | Admitting: Physical Therapy

## 2019-05-14 DIAGNOSIS — M5442 Lumbago with sciatica, left side: Secondary | ICD-10-CM | POA: Diagnosis not present

## 2019-05-14 DIAGNOSIS — M6281 Muscle weakness (generalized): Secondary | ICD-10-CM

## 2019-05-14 DIAGNOSIS — R293 Abnormal posture: Secondary | ICD-10-CM

## 2019-05-14 DIAGNOSIS — G8929 Other chronic pain: Secondary | ICD-10-CM

## 2019-05-14 NOTE — Therapy (Signed)
[facility] [address], [place], [place], [address] Phone: [phone]   Fax:  [phone]  Physical Therapy Treatment  Patient Details  Name: Tina Hester MRN: [id number] Date of Birth: 06/16/1941 Referring Provider (PT): Jovita Gamma, MD   Encounter Date: 05/14/2019  PT End of Session - 05/14/19 1451    Visit Number  10    Date for PT Re-Evaluation  05/18/19    Authorization Type  [employer]    PT Start Time  1450    PT Stop Time  1530    PT Time Calculation (min)  40 min    Activity Tolerance  Patient tolerated treatment well    Behavior During Therapy  [facility] for tasks assessed/performed       Past Medical History:  Diagnosis Date  . Anemia    s/p Heme work -up normal EGD and colonscopy in 2012 per pt,neg SPEP  . Anxiety   . Aortic stenosis    very mild by echo 05/2017  . Bilateral carotid artery stenosis 06/08/2015   1-39% bilateral  . Bradycardia   . CKD (chronic kidney disease), stage III ([place])   . Diabetes mellitus without complication ([place])    type 2  . GERD (gastroesophageal reflux disease)   . Gout   . HSV-1 (herpes simplex virus 1) infection    Acyclovir prn  . Hyperkalemia   . Hyperlipidemia   . Hypertension   . Joint pain    osteoarthritis by Xray- possible erosion of R 4th MCP,elevated uric  acid ,ANA +   . Morbid obesity ([place])   . OSA on CPAP   . Osteopenia   . PAF (paroxysmal atrial fibrillation) ([place])   . Pain management    Neurosurg: Dr Clydell Hakim  . Pulmonary HTN ([place])    Moderate by echo  2016. PASP 68mmHg  . TIA (transient ischemic attack)    remote history of TIA's early 2000    Past Surgical History:  Procedure Laterality Date  . ABDOMINAL HYSTERECTOMY    . APPENDECTOMY    . BACK SURGERY     cervical fusion  . BLADDER SURGERY    . CESAREAN SECTION    . CHOLECYSTECTOMY      There were no vitals filed for this visit.  Subjective Assessment -  05/14/19 1453    Subjective  I am tired. I am trying some supplemental OTC collagen to see if that helps my joints.    Pertinent History  CHF, postural dizziness, Diabetic on insulin, on blood thinners    Currently in Pain?  Yes    Pain Score  4     Pain Location  Buttocks    Pain Orientation  Right    Pain Descriptors / Indicators  Discomfort    Aggravating Factors   Doing too much    Pain Relieving Factors  Sitting    Multiple Pain Sites  No                       [facility] Adult PT Treatment/Exercise - 05/14/19 0001      Lumbar Exercises: Stretches   Active Hamstring Stretch  Right;Left;2 reps;20 seconds      Lumbar Exercises: Aerobic   Nustep  L1 x 5 min       Lumbar Exercises: Standing   Row  Strengthening;Both;10 reps;Theraband    Theraband Level (Row)  Level 1 (Yellow)    Shoulder Extension  Strengthening;Both;10 reps;Theraband  Theraband Level (Shoulder Extension)  Level 1 (Yellow)      Lumbar Exercises: Seated   Sit to Stand Limitations  Ab setting/pushes into dense foam rol 10 sec hold x10    Other Seated Lumbar Exercises  Seated cat/camel flow 10x, PTA demo and pt could do with min cues.     Other Seated Lumbar Exercises  Sidebend stretch Bil 5x 10 sec    Done in standing today              PT Short Term Goals - 04/30/19 1505      PT SHORT TERM GOAL #1   Title  Pt ind in initial HEP    Time  3    Period  Weeks    Status  Achieved    Target Date  04/13/19      PT SHORT TERM GOAL #2   Title  Pt will report reduction in pain by at least 20%    Time  4    Period  Weeks    Status  On-going   At times my pain is less. If i can concentrate on my posture I do better.       PT Long Term Goals - 04/25/19 1508      PT LONG TERM GOAL #1   Title  Pt will be ind in advanced HEP and understand how to safely progress.    Status  On-going      PT LONG TERM GOAL #3   Title  Pt will be able to tolerate at least 20 min of standing household  tasks before needing a seated break.    Baseline  does 20 min but increases pain    Status  On-going      PT LONG TERM GOAL #4   Title  Pt will be able to perform sit to stand and bed mobility transfers (sit to SL, SL to supine, and reverse) with good use of deep core to protect from injury and pain.    Status  On-going      PT LONG TERM GOAL #5   Title  Pt will achieve at least 4+/5 strength throughout bil LEs to improve functional task performance such as transfers and stairs.    Status  On-going            Plan - 05/14/19 1452    Clinical Impression Statement  Today added some seated spinal mobility ( Cat/camel) which pt did very well with. It was tiring to pt but she broke it into 2 sets and could finish 10 reps. Pt was able to complete 5 min on the Nustep which was extremely hard for her to do cardiovascularly and LE strength wise. Pt reports her legs are definitely stronger. Pt was able to perform standing trunk strength ex with yellow band without complaints of pain. Pt could hold her side stretches longer and ab pushes longer today.    Personal Factors and Comorbidities  Age;Fitness;Comorbidity 1;Comorbidity 2;Comorbidity 3+    Comorbidities  obesity, CHF, insulin dependent diabetes    Examination-Activity Limitations  Squat;Stairs;Locomotion Level;Stand;Transfers    Examination-Participation Restrictions  Laundry;Cleaning;Shop;Community Activity;Meal Prep    Stability/Clinical Decision Making  Evolving/Moderate complexity    Rehab Potential  Good    PT Frequency  2x / week    PT Treatment/Interventions  ADLs/Self Care Home Management;Cryotherapy;Electrical Stimulation;Iontophoresis 4mg /ml Dexamethasone;Moist Heat;Stair training;Gait training;Traction;Functional mobility training;Therapeutic activities;Therapeutic exercise;Balance training;Neuromuscular re-education;Patient/family education;Manual techniques;Passive range of motion;Dry needling;Energy conservation;Taping;Spinal  Manipulations;Joint Manipulations  PT Next Visit Plan  Re-eval next. Pt unsure her pain has changed but feels like she "doing more." MMT, ROm lumbar/hips FOTO, consider aquatic therapy??    PT Home Exercise Plan  Access Code: [email or web address]    Consulted and Agree with Plan of Care  Patient       Patient will benefit from skilled therapeutic intervention in order to improve the following deficits and impairments:  Abnormal gait, Decreased balance, Decreased endurance, Decreased mobility, Difficulty walking, Hypomobility, Impaired sensation, Cardiopulmonary status limiting activity, Decreased range of motion, Improper body mechanics, Obesity, Decreased activity tolerance, Decreased strength, Impaired flexibility, Postural dysfunction, Pain  Visit Diagnosis: Chronic bilateral low back pain with bilateral sciatica  Abnormal posture  Muscle weakness (generalized)     Problem List Patient Active Problem List   Diagnosis Date Noted  . Hematoma of lower leg 12/18/2018  . Low back pain 12/18/2018  . Contusion of left knee 06/16/2018  . Pain in left knee 06/16/2018  . Rib pain 06/16/2018  . Glaucoma suspect of left eye 12/22/2017  . Secondary open-angle glaucoma of right eye, indeterminate stage 12/22/2017  . Postural dizziness with presyncope 12/13/2017  . Chest pain 12/13/2017  . Hyperkalemia 12/13/2017  . Acute kidney injury ([place]) 12/13/2017  . Fluid overload 12/13/2017  . Bradycardia 12/13/2017  . CHF (congestive heart failure) ([place]) 12/13/2017  . Aortic stenosis 11/02/2016  . Abrasion of right cornea 04/12/2016  . Pulmonary HTN ([place]) 12/15/2015  . Bilateral carotid artery stenosis 06/08/2015  . Hypertension   . OSA on CPAP   . Persistent atrial fibrillation ([place])   . Morbid obesity ([place]) 09/04/2013  . Gout 09/04/2013  . DM2 (diabetes mellitus, type 2) ([place]) 05/29/2013  . Hyperlipidemia 05/29/2013  . Anxiety 01/31/2012  . CKD (chronic kidney disease), stage III ([place]) 01/31/2012   . Diabetes mellitus type 2 with complications, uncontrolled ([place]) 01/31/2012  . Hypothyroidism 01/31/2012  . TIA (transient ischemic attack) 01/31/2012    Emilija Bohman, PTA 05/14/2019, 3:27 PM  [facility] [address] [place], [place], [address] Phone: [phone]   Fax:  [phone]  Name: Tina Hester MRN: [id number] Date of Birth: Jun 29, 1941

## 2019-05-17 ENCOUNTER — Encounter: Payer: Self-pay | Admitting: Physical Therapy

## 2019-05-17 ENCOUNTER — Ambulatory Visit: Payer: PPO | Admitting: Physical Therapy

## 2019-05-17 ENCOUNTER — Other Ambulatory Visit: Payer: Self-pay

## 2019-05-17 DIAGNOSIS — M5442 Lumbago with sciatica, left side: Secondary | ICD-10-CM

## 2019-05-17 DIAGNOSIS — R293 Abnormal posture: Secondary | ICD-10-CM

## 2019-05-17 DIAGNOSIS — G8929 Other chronic pain: Secondary | ICD-10-CM

## 2019-05-17 DIAGNOSIS — M6281 Muscle weakness (generalized): Secondary | ICD-10-CM

## 2019-05-17 NOTE — Patient Instructions (Addendum)
Aquatic Therapy:  Keeping Everyone Safe!!!  We are so excited to be back in the pool for therapy and can't wait to see you in the water!! Having said that, we also want to make sure that we keep you, your family member, and everyone one else safe.  We have been in touch with our national aquatic association as well as the CDC to develop safe guidelines for aquatic therapy.  First, we want to assure you that the water is one of the safest places to be right now - chlorine and bromine kill the virus and the CDC states the virus cannot be transmitted in a pool; that's great news for [place]! Below are specific guidelines from the CDC that we will ask you and your caregiver to follow when coming to the [facility] for therapy. 1. Please shower AT HOME and come in your bathing suit and cover up to the pool. 2. Plan to leave in your suit and cover up and change at home - this decreases or eliminates time you will spend in the locker room. 3. Follow the Aquatic Center's guidelines to use bathroom facilities as needed (they will educate your caregiver on these guidelines as they enter the Center). 4. Your caregiver will be checked in and screened by the [facility] (there will be a table outside at the front door).  5. We will meet YOU at the door and escort you into the pool directly - you will not need to be screened by the Center.  We will screen as you enter with [place].  Your caregiver will join [place] pool side after check in. 6. Masks:  your caregiver must wear a mask at all times. The CDC recommends that patients and therapists wear their masks until we enter the water and then put them back on as we leave the water. PLEASE BRING A PLASTIC BAG TO STORE YOUR MASK IN WHILE WE ARE IN THE POOL.   Additional safety measures: 1. The [facility] will be practicing social distancing, wearing masks and implementing a stringent cleaning program.  2. We will only be using hard surface equipment and we will  be cleaning it between all patients. 3. We will be cleaning hand rails used to enter the pool and chairs that your caregiver might use. 4. We as employees of [employer] must complete a screening every day we work prior to our shift. 5. We are only offering aquatic therapy to patients who have been determined to be at low risk for the virus - we are happy to share that screen with you if you are interested.  We are excited to be able to offer aquatic therapy again in addition to services at our outpatient clinic - thank you for the opportunity to serve you!  Myrene Galas, PTA [phone] Leone Payor, PT      [facility] Welcome to [facility]! Here you will find all the information you will need regarding your pool therapy. If you have further questions at any time, please call our office at [phone]. After completing your initial evaluation in the [place] clinic, you may be eligible to complete a portion of your therapy in the pool. A typical week of therapy will consist of 1-2 typical physical therapy visits at our [place] location and an additional session of therapy in the pool located at the [facility] at [place], [place], [place] [address].  Aquatic therapy will  be offered on Friday afternoons. Each session will last approximately 30 minutes. All scheduling and payments for aquatic therapy sessions, including cancelations, will be done through our [place] location.  To be eligible for aquatic therapy, these criteria must be met: . You must be able to independently change in the locker room and get to the pool deck. A caregiver can come with you to help if needed. There are bleachers for a caregiver to sit on next to the pool. . No one with an open wound is permitted in the pool.  Handicap parking is available in the front and there is a drop off option for even closer accessibility. Please arrive 15  minutes prior to your appointment to prepare for your pool session. You must sign in at the front desk upon your arrival. Please be sure to attend to any toileting needs prior to entering the pool. [place] rooms for changing are located to the right of the check-in desk. There is direct access to the pool deck from the locker room. You can lock your belongings in a locker but must bring a lock. Your therapist will greet you on the pool deck. There may be other swimmers in the pool at the same time but your session is one-on-one with the therapist.

## 2019-05-17 NOTE — Therapy (Signed)
[facility] [address], [place], [place], [address] Phone: [phone]   Fax:  [phone]  Physical Therapy Treatment  Patient Details  Name: Tina Hester MRN: [id number] Date of Birth: 19-Aug-1941 Referring Provider (PT): Jovita Gamma, MD   Encounter Date: 05/17/2019  PT End of Session - 05/17/19 1617    Visit Number  11    Date for PT Re-Evaluation  06/21/19    Authorization Type  [employer]    PT Start Time  1450    PT Stop Time  1530    PT Time Calculation (min)  40 min    Activity Tolerance  Patient limited by fatigue;Patient tolerated treatment well    Behavior During Therapy  [facility] for tasks assessed/performed       Past Medical History:  Diagnosis Date  . Anemia    s/p Heme work -up normal EGD and colonscopy in 2012 per pt,neg SPEP  . Anxiety   . Aortic stenosis    very mild by echo 05/2017  . Bilateral carotid artery stenosis 06/08/2015   1-39% bilateral  . Bradycardia   . CKD (chronic kidney disease), stage III ([place])   . Diabetes mellitus without complication ([place])    type 2  . GERD (gastroesophageal reflux disease)   . Gout   . HSV-1 (herpes simplex virus 1) infection    Acyclovir prn  . Hyperkalemia   . Hyperlipidemia   . Hypertension   . Joint pain    osteoarthritis by Xray- possible erosion of R 4th MCP,elevated uric  acid ,ANA +   . Morbid obesity ([place])   . OSA on CPAP   . Osteopenia   . PAF (paroxysmal atrial fibrillation) ([place])   . Pain management    Neurosurg: Dr Clydell Hakim  . Pulmonary HTN ([place])    Moderate by echo  2016. PASP 23mHg  . TIA (transient ischemic attack)    remote history of TIA's early 2000    Past Surgical History:  Procedure Laterality Date  . ABDOMINAL HYSTERECTOMY    . APPENDECTOMY    . BACK SURGERY     cervical fusion  . BLADDER SURGERY    . CESAREAN SECTION    . CHOLECYSTECTOMY      There were no vitals filed for this  visit.  Subjective Assessment - 05/17/19 1457    Subjective  I am tired today. I've been busy.  I haven't gotten to follow up on getting a walker.  I use my son's walker sometimes and really like it.  I know I need to get one.  I am able to do my every day activities longer before needing to sit down and take a break.  I can do household tasks up to 25 min at a time now.  I'd say my pain is still the same but activities are better with the same pain.    Pertinent History  CHF, postural dizziness, Diabetic on insulin, on blood thinners    Limitations  Standing;Walking;House hold activities;Lifting    How long can you sit comfortably?  unlimited    How long can you stand comfortably?  25 min    How long can you walk comfortably?  household distances    Diagnostic tests  MRI May 2020, degenerative discs lumbar    Currently in Pain?  Yes    Pain Score  5     Pain Location  Back    Pain Orientation  Right;Left;Lower  Pain Descriptors / Indicators  Aching;Tiring    Pain Type  Chronic pain    Pain Onset  More than a month ago    Pain Frequency  Intermittent    Aggravating Factors   being on feet    Pain Relieving Factors  sitting    Effect of Pain on Daily Activities  all household tasks         [facility] PT Assessment - 05/17/19 0001      Assessment   Medical Diagnosis  M54.40 (ICD-10-CM) - Lumbago with sciatica, unspecified side    Referring Provider (PT)  Jovita Gamma, MD      Observation/Other Assessments   Focus on Therapeutic Outcomes (FOTO)   56%   was 64% limited at eval, goal is 54%     ROM / Strength   AROM / PROM / Strength  Strength;AROM      Strength   Overall Strength Comments  Bil hamstrings 4-/5, hip flexors 4+/5, quads 4+/5, hip ER 4-/5, hip IR 4-/5, hip ext 4-/5, hip abd 4-/5, all bil                   [facility] Adult PT Treatment/Exercise - 05/17/19 0001      Ambulation/Gait   Gait Comments  easily winded, wide stance, slow cautious gait without AD,  improved gait velocity and symmetry with use of rolling walker in clinic      Lumbar Exercises: Aerobic   Nustep  L1 x 7' with intermittent participation due to stopping while talking through goals      Lumbar Exercises: Seated   Long Arc Quad on Chair  Strengthening;Both;2 sets;10 reps      Knee/Hip Exercises: Seated   Ball Squeeze  10x5 sec    Clamshell with TheraBand  Red   20 reps   Hamstring Curl  Strengthening;Both;15 reps    Hamstring Limitations  red band around ankles    Sit to [employer]  5 reps               PT Short Term Goals - 04/30/19 1505      PT SHORT TERM GOAL #1   Title  Pt ind in initial HEP    Time  3    Period  Weeks    Status  Achieved    Target Date  04/13/19      PT SHORT TERM GOAL #2   Title  Pt will report reduction in pain by at least 20%    Time  4    Period  Weeks    Status  On-going   At times my pain is less. If i can concentrate on my posture I do better.       PT Long Term Goals - 05/17/19 1507      PT LONG TERM GOAL #1   Title  Pt will be ind in advanced HEP and understand how to safely progress.    Time  5    Period  Weeks    Status  On-going    Target Date  06/21/19      PT LONG TERM GOAL #2   Title  Pt will reduce FOTO to </= 54% to demo less limitation.    Baseline  56% 05/17/19    Time  5    Period  Weeks    Status  On-going    Target Date  06/21/19      PT LONG TERM GOAL #3   Title  Pt  will be able to tolerate at least 30 min of standing household tasks 5/7 days of the week before needing a seated break.    Baseline  25 min, met initial goal of 20 min most days of the week, revised to progress to 30 min    Time  5    Period  Weeks    Status  Revised    Target Date  06/21/19      PT LONG TERM GOAL #4   Title  Pt will be able to perform sit to stand and bed mobility transfers (sit to SL, SL to supine, and reverse) with good use of deep core to protect from injury and pain.    Time  5    Period  Weeks    Status   On-going    Target Date  06/21/19      PT LONG TERM GOAL #5   Title  Pt will achieve at least 4+/5 strength throughout bil LEs to improve functional task performance such as transfers and stairs.    Baseline  4-/5 bil hamstrings, hip ER, IR 4-/5 bil, met for all other muscle groups    Time  5    Period  Weeks    Status  On-going    Target Date  06/21/19      Additional Long Term Goals   Additional Long Term Goals  Yes      PT LONG TERM GOAL #6   Title  Pt will report compliance with HEP at least 5 days a week for improved strength, mobility and endurance to improve tolerance of daily activities such as meal prep and cleaning.    Time  5    Period  Weeks    Status  New    Target Date  06/21/19      PT LONG TERM GOAL #7   Title  Pt will purchase walker as per MD recommendation to increase community participation with less pain and less fall risk.    Time  5    Period  Weeks    Status  New    Target Date  06/21/19            Plan - 05/17/19 1618    Clinical Impression Statement  Pt making slow but steady improvements in PT. She does not yet report reduced back pain but is tolerating standing/household tasks for 15-25 min most days before needing seated break, meeting a LTG of 20 min tolerance.  Bil LE strength has improved in all muscle groups, partially meeting LTG, but with room to improve in bil hip rotators, extensors and abductors (all 4-/5 bil).  FOTO score has reduced from 64 to 56% demo'ing less functional limitation, with goal of 54%.  Her cardiovascular endurance is poor/fair and she has fair participation in PT visits due to fear, pain and some resistance to exercise due to fatigue.  PT has repeatedly reminded Pt that she would greatly benefit from getting a walker/rollator, initiate a walking-for-exericse program using the walker, and needs to perform HEP daily on days she does not have PT.  PT believes Pt will make greater gains towards remaining goals with more  commitment to walking/exericse outside of PT, which was communicated to Pt.  PT has extended POC for 2x/week for 5 more weeks to include 1 land-based and 1 aquatic-based visit to take advantage of buoyant environment and ease of movement in water to see if this yields greater exercise tolerance which she could apply  through her [employer] when PT ends.    Personal Factors and Comorbidities  Age;Fitness;Comorbidity 1;Comorbidity 2;Comorbidity 3+    Comorbidities  obesity, CHF, insulin dependent diabetes    Examination-Activity Limitations  Squat;Stairs;Locomotion Level;Stand;Transfers    Examination-Participation Restrictions  Laundry;Cleaning;Shop;Community Activity;Meal Prep    Stability/Clinical Decision Making  Evolving/Moderate complexity    Clinical Decision Making  Moderate    Rehab Potential  Good    PT Frequency  2x / week    PT Duration  6 weeks    PT Treatment/Interventions  ADLs/Self Care Home Management;Cryotherapy;Electrical Stimulation;Iontophoresis 40m/ml Dexamethasone;Moist Heat;Stair training;Gait training;Traction;Functional mobility training;Therapeutic activities;Therapeutic exercise;Balance training;Neuromuscular re-education;Patient/family education;Manual techniques;Passive range of motion;Dry needling;Energy conservation;Taping;Spinal Manipulations;Joint Manipulations;Aquatic Therapy    PT Next Visit Plan  extend PT for land and aquatic PT, did Pt get a walker, set up walking program for exercise, f/u on HEP compliance, endurance walking, strength, HEP compliance f/u    PT Home Exercise Plan  Access Code: [email or web address]    Consulted and Agree with Plan of Care  Patient       Patient will benefit from skilled therapeutic intervention in order to improve the following deficits and impairments:  Abnormal gait, Decreased balance, Decreased endurance, Decreased mobility, Difficulty walking, Hypomobility, Impaired sensation, Cardiopulmonary status limiting activity, Decreased range  of motion, Improper body mechanics, Obesity, Decreased activity tolerance, Decreased strength, Impaired flexibility, Postural dysfunction, Pain  Visit Diagnosis: Chronic bilateral low back pain with bilateral sciatica - Plan: PT plan of care cert/re-cert  Abnormal posture - Plan: PT plan of care cert/re-cert  Muscle weakness (generalized) - Plan: PT plan of care cert/re-cert     Problem List Patient Active Problem List   Diagnosis Date Noted  . Hematoma of lower leg 12/18/2018  . Low back pain 12/18/2018  . Contusion of left knee 06/16/2018  . Pain in left knee 06/16/2018  . Rib pain 06/16/2018  . Glaucoma suspect of left eye 12/22/2017  . Secondary open-angle glaucoma of right eye, indeterminate stage 12/22/2017  . Postural dizziness with presyncope 12/13/2017  . Chest pain 12/13/2017  . Hyperkalemia 12/13/2017  . Acute kidney injury ([place] 12/13/2017  . Fluid overload 12/13/2017  . Bradycardia 12/13/2017  . CHF (congestive heart failure) ([place] 12/13/2017  . Aortic stenosis 11/02/2016  . Abrasion of right cornea 04/12/2016  . Pulmonary HTN ([place] 12/15/2015  . Bilateral carotid artery stenosis 06/08/2015  . Hypertension   . OSA on CPAP   . Persistent atrial fibrillation ([place]   . Morbid obesity ([place] 09/04/2013  . Gout 09/04/2013  . DM2 (diabetes mellitus, type 2) ([place] 05/29/2013  . Hyperlipidemia 05/29/2013  . Anxiety 01/31/2012  . CKD (chronic kidney disease), stage III ([place] 01/31/2012  . Diabetes mellitus type 2 with complications, uncontrolled ([place] 01/31/2012  . Hypothyroidism 01/31/2012  . TIA (transient ischemic attack) 01/31/2012    JBaruch Merl PT 05/17/19 4:44 PM   [facility] [address] [place] [address]Phone: [phone]  Fax:  [phone] Name: Tina SCHWOERERMRN: [id number] of Birth: [birth year]

## 2019-05-23 DIAGNOSIS — E1165 Type 2 diabetes mellitus with hyperglycemia: Secondary | ICD-10-CM | POA: Diagnosis not present

## 2019-05-29 ENCOUNTER — Telehealth: Payer: Self-pay | Admitting: Physical Therapy

## 2019-05-29 ENCOUNTER — Ambulatory Visit: Payer: PPO | Admitting: Physical Therapy

## 2019-05-29 ENCOUNTER — Other Ambulatory Visit: Payer: Self-pay | Admitting: Cardiology

## 2019-05-29 MED ORDER — AMLODIPINE BESYLATE 5 MG PO TABS: 5.0000 mg | ORAL_TABLET | Freq: Every day | ORAL | 2 refills | Status: DC

## 2019-05-29 NOTE — Telephone Encounter (Signed)
Left message about missed appointment today 05/29/19 at 2pm.  Asked Pt to call back to confirm next appointment on 06/05/19 at [place], PT 05/29/19 2:19 PM

## 2019-05-30 ENCOUNTER — Other Ambulatory Visit: Payer: Self-pay | Admitting: Endocrinology

## 2019-06-01 ENCOUNTER — Encounter: Payer: PPO | Admitting: Physical Therapy

## 2019-06-05 ENCOUNTER — Other Ambulatory Visit: Payer: Self-pay

## 2019-06-05 ENCOUNTER — Ambulatory Visit: Payer: PPO | Attending: Neurosurgery | Admitting: Physical Therapy

## 2019-06-05 ENCOUNTER — Encounter: Payer: Self-pay | Admitting: Physical Therapy

## 2019-06-05 DIAGNOSIS — M5442 Lumbago with sciatica, left side: Secondary | ICD-10-CM | POA: Diagnosis not present

## 2019-06-05 DIAGNOSIS — M6281 Muscle weakness (generalized): Secondary | ICD-10-CM | POA: Diagnosis not present

## 2019-06-05 DIAGNOSIS — R293 Abnormal posture: Secondary | ICD-10-CM | POA: Diagnosis not present

## 2019-06-05 DIAGNOSIS — M5441 Lumbago with sciatica, right side: Secondary | ICD-10-CM | POA: Diagnosis not present

## 2019-06-05 DIAGNOSIS — G8929 Other chronic pain: Secondary | ICD-10-CM

## 2019-06-05 NOTE — Therapy (Signed)
[facility] [address], [address] [place], [place], [address] Phone: [phone]   Fax:  [phone]  Physical Therapy Treatment  Patient Details  Name: Tina Hester MRN: [id number] Date of Birth: 04/17/1941 Referring Provider (PT): Nudelman, Robert, MD   Encounter Date: 06/05/2019  PT End of Session - 06/05/19 1413    Visit Number  12    Date for PT Re-Evaluation  06/21/19    Authorization Type  [employer]    PT Start Time  0203    PT Stop Time  0242    PT Time Calculation (min)  39 min    Activity Tolerance  Patient limited by fatigue;Patient tolerated treatment well    Behavior During Therapy  WFL for tasks assessed/performed       Past Medical History:  Diagnosis Date  . Anemia    s/p Heme work -up normal EGD and colonscopy in 2012 per pt,neg SPEP  . Anxiety   . Aortic stenosis    very mild by echo 05/2017  . Bilateral carotid artery stenosis 06/08/2015   1-39% bilateral  . Bradycardia   . CKD (chronic kidney disease), stage III   . Diabetes mellitus without complication ([facility])    type 2  . GERD (gastroesophageal reflux disease)   . Gout   . HSV-1 (herpes simplex virus 1) infection    Acyclovir prn  . Hyperkalemia   . Hyperlipidemia   . Hypertension   . Joint pain    osteoarthritis by Xray- possible erosion of R 4th MCP,elevated uric  acid ,ANA +   . Morbid obesity ([facility])   . OSA on CPAP   . Osteopenia   . PAF (paroxysmal atrial fibrillation) ([facility])   . Pain management    Neurosurg: Dr Paul Harkins  . Pulmonary HTN ([facility])    Moderate by echo  2016. PASP 41mmHg  . TIA (transient ischemic attack)    remote history of TIA's early 2000    Past Surgical History:  Procedure Laterality Date  . ABDOMINAL HYSTERECTOMY    . APPENDECTOMY    . BACK SURGERY     cervical fusion  . BLADDER SURGERY    . CESAREAN SECTION    . CHOLECYSTECTOMY      There were no vitals filed for this  visit.  Subjective Assessment - 06/05/19 1406    Subjective  I'm going to order a rollator from [employer] before next visit.  They didn't have them in stock when I went in person.  I also got a ball for my HEP (ball squeeze between knees).  10/10 pain on waking but goes down to a 5/10 during the day.    Pertinent History  CHF, postural dizziness, Diabetic on insulin, on blood thinners    Limitations  Standing;Walking;House hold activities;Lifting    How long can you sit comfortably?  unlimited    How long can you stand comfortably?  25 min    How long can you walk comfortably?  household distances    Diagnostic tests  MRI May 2020, degenerative discs lumbar    Patient Stated Goals  get rid of the pain, improve tolerance of household tasks with less pain    Currently in Pain?  Yes    Pain Score  5     Pain Location  Back    Pain Orientation  Right;Left;Lower    Pain Descriptors / Indicators  Aching;Tiring    Pain Type  Chronic pain      Pain Onset  More than a month ago    Pain Frequency  Intermittent    Aggravating Factors   on waking, prolonged walking    Pain Relieving Factors  sitting    Effect of Pain on Daily Activities  household tasks, errands    Multiple Pain Sites  No                       [facility] Adult PT Treatment/Exercise - 06/05/19 0001      Ambulation/Gait   Ambulation/Gait Assistance  6: Modified independent (Device/Increase time)    Ambulation Distance (Feet)  240 Feet   x 3 reps   Assistive device  Rolling walker    Gait Comments  3' x 3 reps as circuit with seated strength      Exercises   Exercises  Lumbar;Knee/Hip      Knee/Hip Exercises: Seated   Long Arc Quad  Strengthening;10 reps;Weights;2 sets    Long Arc Quad Weight  2 lbs.    Long [employer] Limitations  sets done as circuit mixed with other seated ther ex and walking bouts x 3' each    Ball Squeeze  10x5 sec   x 2 rounds   Clamshell with TheraBand  Blue   2x15   Marching   Strengthening;20 reps;Both;Weights    Marching Weights  2 lbs.    Hamstring Curl  Strengthening;2 sets;10 reps    Hamstring Limitations  blue band around ankles    Sit to [place]  2 sets;5 reps;with UE support               PT Short Term Goals - 04/30/19 1505      PT SHORT TERM GOAL #1   Title  Pt ind in initial HEP    Time  3    Period  Weeks    Status  Achieved    Target Date  04/13/19      PT SHORT TERM GOAL #2   Title  Pt will report reduction in pain by at least 20%    Time  4    Period  Weeks    Status  On-going   At times my pain is less. If i can concentrate on my posture I do better.       PT Long Term Goals - 05/17/19 1507      PT LONG TERM GOAL #1   Title  Pt will be ind in advanced HEP and understand how to safely progress.    Time  5    Period  Weeks    Status  On-going    Target Date  06/21/19      PT LONG TERM GOAL #2   Title  Pt will reduce FOTO to </= 54% to demo less limitation.    Baseline  56% 05/17/19    Time  5    Period  Weeks    Status  On-going    Target Date  06/21/19      PT LONG TERM GOAL #3   Title  Pt will be able to tolerate at least 30 min of standing household tasks 5/7 days of the week before needing a seated break.    Baseline  25 min, met initial goal of 20 min most days of the week, revised to progress to 30 min    Time  5    Period  Weeks    Status  Revised    Target Date  06/21/19      PT LONG TERM GOAL #4   Title  Pt will be able to perform sit to stand and bed mobility transfers (sit to SL, SL to supine, and reverse) with good use of deep core to protect from injury and pain.    Time  5    Period  Weeks    Status  On-going    Target Date  06/21/19      PT LONG TERM GOAL #5   Title  Pt will achieve at least 4+/5 strength throughout bil LEs to improve functional task performance such as transfers and stairs.    Baseline  4-/5 bil hamstrings, hip ER, IR 4-/5 bil, met for all other muscle groups    Time  5     Period  Weeks    Status  On-going    Target Date  06/21/19      Additional Long Term Goals   Additional Long Term Goals  Yes      PT LONG TERM GOAL #6   Title  Pt will report compliance with HEP at least 5 days a week for improved strength, mobility and endurance to improve tolerance of daily activities such as meal prep and cleaning.    Time  5    Period  Weeks    Status  New    Target Date  06/21/19      PT LONG TERM GOAL #7   Title  Pt will purchase walker as per MD recommendation to increase community participation with less pain and less fall risk.    Time  5    Period  Weeks    Status  New    Target Date  06/21/19            Plan - 06/05/19 1432    Clinical Impression Statement  Pt with consistent work rate at PT today.  PT set up circuit with 3x3' walking with walker bouts separated by seated ther ex.  Pt gets winded with all ther ex.  She tolerated increased reps and resistance today without increased pain.  She continues to report pain with standing and walking which is improved with leaning on carts at stores.  She has made efforts to purchase a rollator but it wasn't in stock so she hopes to order one.  She noted that without PT for 2 weeks her back was a little worse.  PT continues to cue the need for core activation during all ther ex, transfers and gait tasks. She will continue to benefit from skilled PT with ongoing encouragement to get walker, do HEP, and continue seeking opportunities to walk for exercise.  Pt hopes to add acupuncture to her treatment regimen as she just found out it is covered by insurance.    Rehab Potential  Good    PT Frequency  2x / week    PT Duration  6 weeks    PT Treatment/Interventions  ADLs/Self Care Home Management;Cryotherapy;Electrical Stimulation;Iontophoresis 4mg/ml Dexamethasone;Moist Heat;Stair training;Gait training;Traction;Functional mobility training;Therapeutic activities;Therapeutic exercise;Balance training;Neuromuscular  re-education;Patient/family education;Manual techniques;Passive range of motion;Dry needling;Energy conservation;Taping;Spinal Manipulations;Joint Manipulations;Aquatic Therapy    PT Next Visit Plan  did Pt get a walker, f/u on HEP compliance, continue walking and ther ex circuits, standing ther ex as tolerated at countertop    PT Home Exercise Plan  Access Code: [email or web address]    Consulted and Agree with Plan of Care  Patient       Patient will benefit from skilled therapeutic intervention   in order to improve the following deficits and impairments:  Abnormal gait, Decreased balance, Decreased endurance, Decreased mobility, Difficulty walking, Hypomobility, Impaired sensation, Cardiopulmonary status limiting activity, Decreased range of motion, Improper body mechanics, Obesity, Decreased activity tolerance, Decreased strength, Impaired flexibility, Postural dysfunction, Pain  Visit Diagnosis: Chronic bilateral low back pain with bilateral sciatica  Abnormal posture  Muscle weakness (generalized)     Problem List Patient Active Problem List   Diagnosis Date Noted  . Hematoma of lower leg 12/18/2018  . Low back pain 12/18/2018  . Contusion of left knee 06/16/2018  . Pain in left knee 06/16/2018  . Rib pain 06/16/2018  . Glaucoma suspect of left eye 12/22/2017  . Secondary open-angle glaucoma of right eye, indeterminate stage 12/22/2017  . Postural dizziness with presyncope 12/13/2017  . Chest pain 12/13/2017  . Hyperkalemia 12/13/2017  . Acute kidney injury ([facility]) 12/13/2017  . Fluid overload 12/13/2017  . Bradycardia 12/13/2017  . CHF (congestive heart failure) ([facility]) 12/13/2017  . Aortic stenosis 11/02/2016  . Abrasion of right cornea 04/12/2016  . Pulmonary HTN ([facility]) 12/15/2015  . Bilateral carotid artery stenosis 06/08/2015  . Hypertension   . OSA on CPAP   . Persistent atrial fibrillation ([facility])   . Morbid obesity ([facility]) 09/04/2013  . Gout 09/04/2013  . DM2 (diabetes mellitus,  type 2) ([facility]) 05/29/2013  . Hyperlipidemia 05/29/2013  . Anxiety 01/31/2012  . CKD (chronic kidney disease), stage III 01/31/2012  . Diabetes mellitus type 2 with complications, uncontrolled ([facility]) 01/31/2012  . Hypothyroidism 01/31/2012  . TIA (transient ischemic attack) 01/31/2012     , PT 06/05/19 2:43 PM   [facility] [address], [address] [place], [place], [address] Phone: [phone]   Fax:  [phone]  Name: Tina Hester MRN: [id number] Date of Birth: 07/25/1941   

## 2019-06-08 ENCOUNTER — Encounter: Payer: PPO | Admitting: Physical Therapy

## 2019-06-11 ENCOUNTER — Other Ambulatory Visit: Payer: Self-pay

## 2019-06-11 ENCOUNTER — Ambulatory Visit: Payer: PPO | Admitting: Physical Therapy

## 2019-06-11 ENCOUNTER — Encounter: Payer: Self-pay | Admitting: Physical Therapy

## 2019-06-11 DIAGNOSIS — M5442 Lumbago with sciatica, left side: Secondary | ICD-10-CM

## 2019-06-11 DIAGNOSIS — G8929 Other chronic pain: Secondary | ICD-10-CM

## 2019-06-11 DIAGNOSIS — M6281 Muscle weakness (generalized): Secondary | ICD-10-CM

## 2019-06-11 DIAGNOSIS — R293 Abnormal posture: Secondary | ICD-10-CM

## 2019-06-11 NOTE — Therapy (Signed)
[facility] [address] [place], [place], [address] Phone: [phone]   Fax:  [phone]  Physical Therapy Treatment  Patient Details  Name: Tina Hester MRN: [id number] Date of Birth: 06-25-1941 Referring Provider (PT): Jovita Gamma, MD   Encounter Date: 06/11/2019  PT End of Session - 06/11/19 1506    Visit Number  13    Date for PT Re-Evaluation  06/21/19    Authorization Type  [employer]    PT Start Time  1506   20 min late   PT Stop Time  1535    PT Time Calculation (min)  29 min    Activity Tolerance  Patient tolerated treatment well    Behavior During Therapy  [facility] for tasks assessed/performed       Past Medical History:  Diagnosis Date  . Anemia    s/p Heme work -up normal EGD and colonscopy in 2012 per pt,neg SPEP  . Anxiety   . Aortic stenosis    very mild by echo 05/2017  . Bilateral carotid artery stenosis 06/08/2015   1-39% bilateral  . Bradycardia   . CKD (chronic kidney disease), stage III   . Diabetes mellitus without complication ([place])    type 2  . GERD (gastroesophageal reflux disease)   . Gout   . HSV-1 (herpes simplex virus 1) infection    Acyclovir prn  . Hyperkalemia   . Hyperlipidemia   . Hypertension   . Joint pain    osteoarthritis by Xray- possible erosion of R 4th MCP,elevated uric  acid ,ANA +   . Morbid obesity ([place])   . OSA on CPAP   . Osteopenia   . PAF (paroxysmal atrial fibrillation) ([place])   . Pain management    Neurosurg: Dr Clydell Hakim  . Pulmonary HTN ([place])    Moderate by echo  2016. PASP 9mHg  . TIA (transient ischemic attack)    remote history of TIA's early 2000    Past Surgical History:  Procedure Laterality Date  . ABDOMINAL HYSTERECTOMY    . APPENDECTOMY    . BACK SURGERY     cervical fusion  . BLADDER SURGERY    . CESAREAN SECTION    . CHOLECYSTECTOMY      There were no vitals filed for this visit.  Subjective  Assessment - 06/11/19 1507    Subjective  I'm sorry I am late. I lost track of time getting ready for my granddaughters bday party. I have no pain this instant.    Pertinent History  CHF, postural dizziness, Diabetic on insulin, on blood thinners    Limitations  Standing;Walking;House hold activities;Lifting    Diagnostic tests  MRI May 2020, degenerative discs lumbar    Currently in Pain?  No/denies    Multiple Pain Sites  No                       [facility] Adult PT Treatment/Exercise - 06/11/19 0001      Lumbar Exercises: Stretches   Active Hamstring Stretch  Right;Left;2 reps;20 seconds    Other Lumbar Stretch Exercise  Seated forward flexion stretch 10x with blue ball      Lumbar Exercises: Aerobic   Nustep  L1 x 5 min       Knee/Hip Exercises: Standing   Hip Abduction  AROM;Stengthening;Both;1 set;10 reps;Knee straight      Knee/Hip Exercises: Seated   Long Arc Quad  Strengthening;Both;2 sets;10 reps;Weights  Long Arc Quad Weight  3 lbs.    Clamshell with TheraBand  Blue   x15   Sit to Sand  10 reps;without UE support               PT Short Term Goals - 04/30/19 1505      PT SHORT TERM GOAL #1   Title  Pt ind in initial HEP    Time  3    Period  Weeks    Status  Achieved    Target Date  04/13/19      PT SHORT TERM GOAL #2   Title  Pt will report reduction in pain by at least 20%    Time  4    Period  Weeks    Status  On-going   At times my pain is less. If i can concentrate on my posture I do better.       PT Long Term Goals - 05/17/19 1507      PT LONG TERM GOAL #1   Title  Pt will be ind in advanced HEP and understand how to safely progress.    Time  5    Period  Weeks    Status  On-going    Target Date  06/21/19      PT LONG TERM GOAL #2   Title  Pt will reduce FOTO to </= 54% to demo less limitation.    Baseline  56% 05/17/19    Time  5    Period  Weeks    Status  On-going    Target Date  06/21/19      PT LONG TERM GOAL  #3   Title  Pt will be able to tolerate at least 30 min of standing household tasks 5/7 days of the week before needing a seated break.    Baseline  25 min, met initial goal of 20 min most days of the week, revised to progress to 30 min    Time  5    Period  Weeks    Status  Revised    Target Date  06/21/19      PT LONG TERM GOAL #4   Title  Pt will be able to perform sit to stand and bed mobility transfers (sit to SL, SL to supine, and reverse) with good use of deep core to protect from injury and pain.    Time  5    Period  Weeks    Status  On-going    Target Date  06/21/19      PT LONG TERM GOAL #5   Title  Pt will achieve at least 4+/5 strength throughout bil LEs to improve functional task performance such as transfers and stairs.    Baseline  4-/5 bil hamstrings, hip ER, IR 4-/5 bil, met for all other muscle groups    Time  5    Period  Weeks    Status  On-going    Target Date  06/21/19      Additional Long Term Goals   Additional Long Term Goals  Yes      PT LONG TERM GOAL #6   Title  Pt will report compliance with HEP at least 5 days a week for improved strength, mobility and endurance to improve tolerance of daily activities such as meal prep and cleaning.    Time  5    Period  Weeks    Status  New    Target Date  06/21/19  PT LONG TERM GOAL #7   Title  Pt will purchase walker as per MD recommendation to increase community participation with less pain and less fall risk.    Time  5    Period  Weeks    Status  New    Target Date  06/21/19            Plan - 06/11/19 1506    Clinical Impression Statement  Pt arrives over 20 minutes late today. Pt reports doing more walking/exercising/moving at home since her last session. She feels her overall activity has increased. She has not gotten a walker yet. Pt had an abbreviated session due to her lateness. No issues with her exercises today.    Personal Factors and Comorbidities  Age;Fitness;Comorbidity  1;Comorbidity 2;Comorbidity 3+    Comorbidities  obesity, CHF, insulin dependent diabetes    Examination-Activity Limitations  Squat;Stairs;Locomotion Level;Stand;Transfers    Examination-Participation Restrictions  Laundry;Cleaning;Shop;Community Activity;Meal Prep    Stability/Clinical Decision Making  Evolving/Moderate complexity    Rehab Potential  Good    PT Frequency  2x / week    PT Duration  6 weeks    PT Treatment/Interventions  ADLs/Self Care Home Management;Cryotherapy;Electrical Stimulation;Iontophoresis 49m/ml Dexamethasone;Moist Heat;Stair training;Gait training;Traction;Functional mobility training;Therapeutic activities;Therapeutic exercise;Balance training;Neuromuscular re-education;Patient/family education;Manual techniques;Passive range of motion;Dry needling;Energy conservation;Taping;Spinal Manipulations;Joint Manipulations;Aquatic Therapy    PT Next Visit Plan  Continue walking and ther ex circuits, standing ther ex as tolerated at countertop    PT Home Exercise Plan  Access Code: [email or web address]    Consulted and Agree with Plan of Care  Patient       Patient will benefit from skilled therapeutic intervention in order to improve the following deficits and impairments:  Abnormal gait, Decreased balance, Decreased endurance, Decreased mobility, Difficulty walking, Hypomobility, Impaired sensation, Cardiopulmonary status limiting activity, Decreased range of motion, Improper body mechanics, Obesity, Decreased activity tolerance, Decreased strength, Impaired flexibility, Postural dysfunction, Pain  Visit Diagnosis: Chronic bilateral low back pain with bilateral sciatica  Abnormal posture  Muscle weakness (generalized)     Problem List Patient Active Problem List   Diagnosis Date Noted  . Hematoma of lower leg 12/18/2018  . Low back pain 12/18/2018  . Contusion of left knee 06/16/2018  . Pain in left knee 06/16/2018  . Rib pain 06/16/2018  . Glaucoma suspect of left eye  12/22/2017  . Secondary open-angle glaucoma of right eye, indeterminate stage 12/22/2017  . Postural dizziness with presyncope 12/13/2017  . Chest pain 12/13/2017  . Hyperkalemia 12/13/2017  . Acute kidney injury ([place] 12/13/2017  . Fluid overload 12/13/2017  . Bradycardia 12/13/2017  . CHF (congestive heart failure) ([place] 12/13/2017  . Aortic stenosis 11/02/2016  . Abrasion of right cornea 04/12/2016  . Pulmonary HTN ([place] 12/15/2015  . Bilateral carotid artery stenosis 06/08/2015  . Hypertension   . OSA on CPAP   . Persistent atrial fibrillation ([place]   . Morbid obesity ([place] 09/04/2013  . Gout 09/04/2013  . DM2 (diabetes mellitus, type 2) ([place] 05/29/2013  . Hyperlipidemia 05/29/2013  . Anxiety 01/31/2012  . CKD (chronic kidney disease), stage III 01/31/2012  . Diabetes mellitus type 2 with complications, uncontrolled ([place] 01/31/2012  . Hypothyroidism 01/31/2012  . TIA (transient ischemic attack) 01/31/2012    Rein Popov, PTA 06/11/2019, 4:17 PM  [facility] [address] [place] [address]Phone: [phone]  Fax:  [phone] Name: RALAA MULLALLYMRN: [id number] of Birth: [birth year]

## 2019-06-15 ENCOUNTER — Encounter: Payer: PPO | Admitting: Physical Therapy

## 2019-06-18 ENCOUNTER — Other Ambulatory Visit: Payer: Self-pay

## 2019-06-18 ENCOUNTER — Encounter: Payer: Self-pay | Admitting: Physical Therapy

## 2019-06-18 ENCOUNTER — Ambulatory Visit: Payer: PPO | Admitting: Physical Therapy

## 2019-06-18 DIAGNOSIS — M5442 Lumbago with sciatica, left side: Secondary | ICD-10-CM | POA: Diagnosis not present

## 2019-06-18 DIAGNOSIS — G8929 Other chronic pain: Secondary | ICD-10-CM

## 2019-06-18 DIAGNOSIS — R293 Abnormal posture: Secondary | ICD-10-CM

## 2019-06-18 DIAGNOSIS — M6281 Muscle weakness (generalized): Secondary | ICD-10-CM

## 2019-06-18 NOTE — Therapy (Addendum)
[facility] [address], [place], [place], [address] Phone: [phone]   Fax:  [phone]  Physical Therapy Treatment  Patient Details  Name: Tina Hester MRN: [id number] Date of Birth: 09-28-1940 Referring Provider (PT): Jovita Gamma, MD   Encounter Date: 06/18/2019  PT End of Session - 06/18/19 1454    Visit Number  14    Date for PT Re-Evaluation  06/21/19    Authorization Type  [employer]    PT Start Time  1454    PT Stop Time  1532    PT Time Calculation (min)  38 min    Activity Tolerance  Patient tolerated treatment well    Behavior During Therapy  [facility] for tasks assessed/performed       Past Medical History:  Diagnosis Date  . Anemia    s/p Heme work -up normal EGD and colonscopy in 2012 per pt,neg SPEP  . Anxiety   . Aortic stenosis    very mild by echo 05/2017  . Bilateral carotid artery stenosis 06/08/2015   1-39% bilateral  . Bradycardia   . CKD (chronic kidney disease), stage III   . Diabetes mellitus without complication ([place])    type 2  . GERD (gastroesophageal reflux disease)   . Gout   . HSV-1 (herpes simplex virus 1) infection    Acyclovir prn  . Hyperkalemia   . Hyperlipidemia   . Hypertension   . Joint pain    osteoarthritis by Xray- possible erosion of R 4th MCP,elevated uric  acid ,ANA +   . Morbid obesity ([place])   . OSA on CPAP   . Osteopenia   . PAF (paroxysmal atrial fibrillation) ([place])   . Pain management    Neurosurg: Dr Clydell Hakim  . Pulmonary HTN ([place])    Moderate by echo  2016. PASP 78mHg  . TIA (transient ischemic attack)    remote history of TIA's early 2000    Past Surgical History:  Procedure Laterality Date  . ABDOMINAL HYSTERECTOMY    . APPENDECTOMY    . BACK SURGERY     cervical fusion  . BLADDER SURGERY    . CESAREAN SECTION    . CHOLECYSTECTOMY      There were no vitals filed for this visit.  Subjective Assessment -  06/18/19 1455    Subjective  I am already out of breath just coming in here. I am better getting up this higher step at my home. I even went down to my basement, goin gup and down the stairs. No device used. Pt was able to walk the science center with her grandson.    Pertinent History  CHF, postural dizziness, Diabetic on insulin, on blood thinners    Currently in Pain?  No/denies   Walking around [employer] 20 min ago wasn't too bad: 3/10 ish        [facility] PT Assessment - 06/18/19 0001      Observation/Other Assessments   Focus on Therapeutic Outcomes (FOTO)   37% limited      Strength   Overall Strength Comments  Bil hamstrings 4+/5, hip flexors 4+/5, quads 4+/5, hip ER 4+/5 ( clamshell position seated), hip IR 4/5, hip ext 4-/5, hip abd 4/5, all bil                   [facility] Adult PT Treatment/Exercise - 06/18/19 0001      Lumbar Exercises: Aerobic   Nustep  L1 x 6  min with PTA present to monitorr SOB      Knee/Hip Exercises: Standing   Hip Flexion  Stengthening;Both;1 set;10 reps;Knee bent    Hip Flexion Limitations  2#    Hip Abduction  Stengthening;Both;1 set;10 reps;Knee straight    Abduction Limitations  2# wts added      Knee/Hip Exercises: Seated   Long Arc Quad  Strengthening;Both;1 set;15 reps;Weights    Long Arc Quad Weight  3 lbs.    Sit to Sand  2 sets;10 reps;without UE support               PT Short Term Goals - 04/30/19 1505      PT SHORT TERM GOAL #1   Title  Pt ind in initial HEP    Time  3    Period  Weeks    Status  Achieved    Target Date  04/13/19      PT SHORT TERM GOAL #2   Title  Pt will report reduction in pain by at least 20%    Time  4    Period  Weeks    Status  On-going   At times my pain is less. If i can concentrate on my posture I do better.       PT Long Term Goals - 06/18/19 0001      PT LONG TERM GOAL #2   Title  Pt will reduce FOTO to </= 54% to demo less limitation.    Baseline  56% 05/17/19    Time  5     Period  Weeks    Status  Achieved   37% limited   Target Date  06/18/19      PT LONG TERM GOAL #3   Title  Pt will be able to tolerate at least 30 min of standing household tasks 5/7 days of the week before needing a seated break.    Time  5    Period  Weeks    Status  Achieved    Target Date  06/18/19      PT LONG TERM GOAL #4   Title  Pt will be able to perform sit to stand and bed mobility transfers (sit to SL, SL to supine, and reverse) with good use of deep core to protect from injury and pain.    Time  5    Period  Weeks    Status  Achieved    Target Date  06/18/19      PT LONG TERM GOAL #5   Title  Pt will achieve at least 4+/5 strength throughout bil LEs to improve functional task performance such as transfers and stairs.    Baseline  4-/5 bil hamstrings, hip ER, IR 4-/5 bil, met for all other muscle groups    Time  5    Period  Weeks    Status  Partially Met    Target Date  06/18/19      PT LONG TERM GOAL #6   Title  Pt will report compliance with HEP at least 5 days a week for improved strength, mobility and endurance to improve tolerance of daily activities such as meal prep and cleaning.    Time  5    Period  Weeks    Status  Achieved   Doing everyday   Target Date  06/18/19      PT LONG TERM GOAL #7   Title  Pt will purchase walker as per MD recommendation to increase community  participation with less pain and less fall risk.    Time  5    Period  Weeks    Status  Not Met    Target Date  06/18/19            Plan - 06/18/19 1454    Clinical Impression Statement  Pt reports she is walking more, doing her steps at home better and a lkittle more frequently, her LE MMt was on avg 1/2 grade better, and her FOTO score greatly improved. Pt cannot come for Re-evaluation this Wednesday but intends on going to her MD to see if he will order more PT. Pt is ambulating community distances without an assistive device. We increased her resistance with her LE weights  today.    Personal Factors and Comorbidities  Age;Fitness;Comorbidity 1;Comorbidity 2;Comorbidity 3+    Comorbidities  obesity, CHF, insulin dependent diabetes    Examination-Activity Limitations  Squat;Stairs;Locomotion Level;Stand;Transfers    Examination-Participation Restrictions  Laundry;Cleaning;Shop;Community Activity;Meal Prep    Stability/Clinical Decision Making  Evolving/Moderate complexity    Rehab Potential  Good    PT Frequency  2x / week    PT Duration  6 weeks    PT Treatment/Interventions  ADLs/Self Care Home Management;Cryotherapy;Electrical Stimulation;Iontophoresis 37m/ml Dexamethasone;Moist Heat;Stair training;Gait training;Traction;Functional mobility training;Therapeutic activities;Therapeutic exercise;Balance training;Neuromuscular re-education;Patient/family education;Manual techniques;Passive range of motion;Dry needling;Energy conservation;Taping;Spinal Manipulations;Joint Manipulations;Aquatic Therapy    PT Next Visit Plan  Discuss pt progress with PT and what pt would like to do with PT. Pt was told she can be on hold 30 days but would be discharged if we did not hear from her within those 30 days.    PT Home Exercise Plan  Access Code: [email or web address]    Consulted and Agree with Plan of Care  Patient       Patient will benefit from skilled therapeutic intervention in order to improve the following deficits and impairments:  Abnormal gait, Decreased balance, Decreased endurance, Decreased mobility, Difficulty walking, Hypomobility, Impaired sensation, Cardiopulmonary status limiting activity, Decreased range of motion, Improper body mechanics, Obesity, Decreased activity tolerance, Decreased strength, Impaired flexibility, Postural dysfunction, Pain  Visit Diagnosis: Chronic bilateral low back pain with bilateral sciatica  Abnormal posture  Muscle weakness (generalized)     Problem List Patient Active Problem List   Diagnosis Date Noted  . Hematoma of lower leg  12/18/2018  . Low back pain 12/18/2018  . Contusion of left knee 06/16/2018  . Pain in left knee 06/16/2018  . Rib pain 06/16/2018  . Glaucoma suspect of left eye 12/22/2017  . Secondary open-angle glaucoma of right eye, indeterminate stage 12/22/2017  . Postural dizziness with presyncope 12/13/2017  . Chest pain 12/13/2017  . Hyperkalemia 12/13/2017  . Acute kidney injury ([place] 12/13/2017  . Fluid overload 12/13/2017  . Bradycardia 12/13/2017  . CHF (congestive heart failure) ([place] 12/13/2017  . Aortic stenosis 11/02/2016  . Abrasion of right cornea 04/12/2016  . Pulmonary HTN ([place] 12/15/2015  . Bilateral carotid artery stenosis 06/08/2015  . Hypertension   . OSA on CPAP   . Persistent atrial fibrillation ([place]   . Morbid obesity ([place] 09/04/2013  . Gout 09/04/2013  . DM2 (diabetes mellitus, type 2) ([place] 05/29/2013  . Hyperlipidemia 05/29/2013  . Anxiety 01/31/2012  . CKD (chronic kidney disease), stage III 01/31/2012  . Diabetes mellitus type 2 with complications, uncontrolled ([place] 01/31/2012  . Hypothyroidism 01/31/2012  . TIA (transient ischemic attack) 01/31/2012    Tasha Diaz, PTA 06/18/2019, 4:20 PM   PHYSICAL THERAPY DISCHARGE SUMMARY  Visits from Start of Care: 14  Current functional level related to goals / functional outcomes: See above   Remaining deficits: See above   Education / Equipment: HEP Plan: Patient agrees to discharge.  Patient goals were partially met. Patient is being discharged due to not returning since the last visit.  ?????        Baruch Merl, PT 07/30/19 12:27 PM     [facility] [address] [place], [place], [address] Phone: [phone]   Fax:  [phone]  Name: Tina Hester MRN: [id number] Date of Birth: 1940-11-05

## 2019-06-19 ENCOUNTER — Ambulatory Visit (INDEPENDENT_AMBULATORY_CARE_PROVIDER_SITE_OTHER): Payer: PPO | Admitting: Podiatry

## 2019-06-19 ENCOUNTER — Other Ambulatory Visit: Payer: Self-pay

## 2019-06-19 DIAGNOSIS — M79674 Pain in right toe(s): Secondary | ICD-10-CM

## 2019-06-19 DIAGNOSIS — B351 Tinea unguium: Secondary | ICD-10-CM | POA: Diagnosis not present

## 2019-06-19 DIAGNOSIS — E119 Type 2 diabetes mellitus without complications: Secondary | ICD-10-CM | POA: Diagnosis not present

## 2019-06-19 DIAGNOSIS — M79675 Pain in left toe(s): Secondary | ICD-10-CM | POA: Diagnosis not present

## 2019-06-19 DIAGNOSIS — L84 Corns and callosities: Secondary | ICD-10-CM

## 2019-06-19 DIAGNOSIS — Z794 Long term (current) use of insulin: Secondary | ICD-10-CM

## 2019-06-19 NOTE — Patient Instructions (Signed)
Diabetes Mellitus and Foot Care Foot care is an important part of your health, especially when you have diabetes. Diabetes may cause you to have problems because of poor blood flow (circulation) to your feet and legs, which can cause your skin to:  Become thinner and drier.  Break more easily.  Heal more slowly.  Peel and crack. You may also have nerve damage (neuropathy) in your legs and feet, causing decreased feeling in them. This means that you may not notice minor injuries to your feet that could lead to more serious problems. Noticing and addressing any potential problems early is the best way to prevent future foot problems. How to care for your feet Foot hygiene  Wash your feet daily with warm water and mild soap. Do not use hot water. Then, pat your feet and the areas between your toes until they are completely dry. Do not soak your feet as this can dry your skin.  Trim your toenails straight across. Do not dig under them or around the cuticle. File the edges of your nails with an emery board or nail file.  Apply a moisturizing lotion or petroleum jelly to the skin on your feet and to dry, brittle toenails. Use lotion that does not contain alcohol and is unscented. Do not apply lotion between your toes. Shoes and socks  Wear clean socks or stockings every day. Make sure they are not too tight. Do not wear knee-high stockings since they may decrease blood flow to your legs.  Wear shoes that fit properly and have enough cushioning. Always look in your shoes before you put them on to be sure there are no objects inside.  To break in new shoes, wear them for just a few hours a day. This prevents injuries on your feet. Wounds, scrapes, corns, and calluses  Check your feet daily for blisters, cuts, bruises, sores, and redness. If you cannot see the bottom of your feet, use a mirror or ask someone for help.  Do not cut corns or calluses or try to remove them with medicine.  If you  find a minor scrape, cut, or break in the skin on your feet, keep it and the skin around it clean and dry. You may clean these areas with mild soap and water. Do not clean the area with peroxide, alcohol, or iodine.  If you have a wound, scrape, corn, or callus on your foot, look at it several times a day to make sure it is healing and not infected. Check for: ? Redness, swelling, or pain. ? Fluid or blood. ? Warmth. ? Pus or a bad smell. General instructions  Do not cross your legs. This may decrease blood flow to your feet.  Do not use heating pads or hot water bottles on your feet. They may burn your skin. If you have lost feeling in your feet or legs, you may not know this is happening until it is too late.  Protect your feet from hot and cold by wearing shoes, such as at the beach or on hot pavement.  Schedule a complete foot exam at least once a year (annually) or more often if you have foot problems. If you have foot problems, report any cuts, sores, or bruises to your health care provider immediately. Contact a health care provider if:  You have a medical condition that increases your risk of infection and you have any cuts, sores, or bruises on your feet.  You have an injury that is not   healing.  You have redness on your legs or feet.  You feel burning or tingling in your legs or feet.  You have pain or cramps in your legs and feet.  Your legs or feet are numb.  Your feet always feel cold.  You have pain around a toenail. Get help right away if:  You have a wound, scrape, corn, or callus on your foot and: ? You have pain, swelling, or redness that gets worse. ? You have fluid or blood coming from the wound, scrape, corn, or callus. ? Your wound, scrape, corn, or callus feels warm to the touch. ? You have pus or a bad smell coming from the wound, scrape, corn, or callus. ? You have a fever. ? You have a red line going up your leg. Summary  Check your feet every day  for cuts, sores, red spots, swelling, and blisters.  Moisturize feet and legs daily.  Wear shoes that fit properly and have enough cushioning.  If you have foot problems, report any cuts, sores, or bruises to your health care provider immediately.  Schedule a complete foot exam at least once a year (annually) or more often if you have foot problems. This information is not intended to replace advice given to you by your health care provider. Make sure you discuss any questions you have with your health care provider. Document Released: 08/13/2000 Document Revised: 09/28/2017 Document Reviewed: 09/17/2016 Elsevier Patient Education  2020 [employer]  

## 2019-06-22 ENCOUNTER — Encounter: Payer: Self-pay | Admitting: Podiatry

## 2019-06-22 ENCOUNTER — Encounter: Payer: PPO | Admitting: Physical Therapy

## 2019-06-22 NOTE — Progress Notes (Addendum)
Subjective: Tina Hester is seen today for follow up painful, elongated, thickened toenails 1-5 b/l feet that she cannot cut. Pain interferes with daily activities. Aggravating factor includes wearing enclosed shoe gear and relieved with periodic debridement.  She is diabetic and remains on blood thinner, Eliquis.  Pt relates painful left great toe on today. Aggravated when wearing shoe gear. Denies any trauma and has not attempted treatment.  Current Outpatient Medications on File Prior to Visit  Medication Sig  . acetaminophen (TYLENOL) 325 MG tablet Take by mouth every 6 (six) hours as needed for mild pain or moderate pain.  Marland Kitchen acyclovir (ZOVIRAX) 400 MG tablet Take 400 mg by mouth 2 (two) times daily.   Marland Kitchen ALPRAZolam (XANAX) 0.25 MG tablet Take 0.25 mg by mouth at bedtime as needed for anxiety.  Marland Kitchen amLODipine (NORVASC) 5 MG tablet Take 1 tablet (5 mg total) by mouth daily.  Marland Kitchen atorvastatin (LIPITOR) 40 MG tablet TAKE 1 TABLET(40 MG) BY MOUTH DAILY AT 6 PM  . CALCIUM PO Take 1 tablet by mouth daily. 600 mg  . Cholecalciferol 2000 units TABS Take 4,000 Units by mouth daily.  Marland Kitchen COLCRYS 0.6 MG tablet Take 0.6 mg by mouth daily as needed (gout).   Marland Kitchen ELIQUIS 5 MG TABS tablet TAKE 1 TABLET(5 MG) BY MOUTH TWICE DAILY (Patient taking differently: Take 5 mg by mouth 2 (two) times daily. )  . gabapentin (NEURONTIN) 100 MG capsule Take 100-200 mg by mouth at bedtime.   Marland Kitchen glucose blood (FREESTYLE LITE) test strip Use to test blood sugar twice daily. Dx: E11.9  . hydrochlorothiazide (HYDRODIURIL) 25 MG tablet TAKE 1 TABLET(25 MG) BY MOUTH DAILY  . hydrocortisone cream 1 % Apply 1 application topically daily as needed for itching.  . insulin NPH Human (HUMULIN N,NOVOLIN N) 100 UNIT/ML injection Inject 25 Units into the skin 2 (two) times daily before a meal. INJECT 25 UNITS UNDER THE SKIN TWICE DAILY WITH MEALS.  Marland Kitchen insulin regular (NOVOLIN R RELION) 100 units/mL injection Inject 0.06-0.1 mLs (6-10  Units total) into the skin 3 (three) times daily before meals. (Patient taking differently: Inject 6-10 Units into the skin 2 (two) times daily as needed (only when eating a big meal). )  . Insulin Syringe-Needle U-100 (INSULIN SYRINGE 1CC/31GX5/16") 31G X 5/16" 1 ML MISC USE THREE TIMES DAILY TO INJECT INSULIN  . levothyroxine (SYNTHROID) 137 MCG tablet Take 137 mcg by mouth daily before breakfast.  . magnesium 30 MG tablet Take 30 mg by mouth at bedtime.   . metFORMIN (GLUCOPHAGE) 500 MG tablet TAKE 1 TABLET BY MOUTH EVERY MORNING AND 2 TABLETS BY MOUTH EVERY EVENING (Patient taking differently: Take 500-1,000 mg by mouth See admin instructions. TAKE 1 TABLET BY MOUTH EVERY MORNING AND 2 TABLETS BY MOUTH EVERY EVENING)  . metoprolol succinate (TOPROL-XL) 25 MG 24 hr tablet TAKE 1 TABLET BY MOUTH TWICE DAILY (Patient taking differently: Take 25 mg by mouth daily. Take 2 tablets (5omg) total by mouth once daily in the morning.)  . NEEDLE, DISP, 30 G (BD DISP NEEDLES) 30G X 1/2" MISC 1 each by Does not apply route 2 (two) times daily before a meal.  . ONETOUCH DELICA LANCETS 99991111 MISC Use to check blood sugars once daily  . pioglitazone (ACTOS) 15 MG tablet Take 1 tablet (15 mg total) by mouth daily. MUST CALL TO SCHEDULE APPT  . prednisoLONE acetate (PRED FORTE) 1 % ophthalmic suspension Place 2 drops into the right eye 4 (four) times daily.   Marland Kitchen  timolol (TIMOPTIC) 0.5 % ophthalmic solution Place 1 drop into both eyes daily.   . vitamin B-12 (CYANOCOBALAMIN) 1000 MCG tablet Take 1,000 mcg by mouth daily.  . vitamin C (ASCORBIC ACID) 500 MG tablet Take 500 mg by mouth daily.   No current facility-administered medications on file prior to visit.      Allergies  Allergen Reactions  . Pneumococcal Vaccine Anaphylaxis    weakness  . Pneumovax 23 [Pneumococcal Vac Polyvalent] Anaphylaxis    weakness  . Other Hives    Other reaction(s): Other (See Comments) Pneumonia  Vaccine- very ill  . Sulfa  Antibiotics Hives     Objective:  Vascular Examination: Capillary refill time immediate x 10 digits.  Dorsalis pedis present b/l.  Posterior tibial pulses present b/l.  Digital hair absent b/l.  Skin temperature gradient WNL b/l.   Dermatological Examination: Skin with normal turgor, texture and tone b/l.  Toenails 1-5 b/l discolored, thick, dystrophic with subungual debris and pain with palpation to nailbeds due to thickness of nails.  Incurvated nailplate left great toe b/l borders with tenderness to palpation. No erythema, no edema, no drainage noted.  Hyperkeratotic lesion submet head 1 b/l with tenderness to palpation. No edema, no erythema, no drainage, no flocculence.  Musculoskeletal: Muscle strength 5/5 to all LE muscle groups  HAV with bunion deformity b/l.  Dorsal exostosis noted right midfoot. Intact with no erythema, no flocculence, no warmth nor pain.  No pain, crepitus or joint limitation noted with ROM.   Neurological Examination: Protective sensation intact with 10 gram monofilament bilaterally.  Epicritic sensation present bilaterally.  Vibratory sensation intact bilaterally.   Assessment: Painful onychomycosis toenails 1-5 b/l  Ingrown toenail left hallux, noninfected Calluses submet head 1 b/l NIDDM  Plan: 1. Toenails 1-5 b/l were debrided in length and girth without iatrogenic bleeding. Offending nail border debrided and curretaged left hallux. Border cleansed with alcohol and tripe antibiotic applied. No further treatment required by patient/cargiver.  2. Calluses pared submetatarsal head 1 b/l utilizing sterile scalpel blade without incident. 3. Patient to continue soft, supportive shoe gear. 4. Patient to report any pedal injuries to medical professional immediately. 5. Follow up 3 months.  6. Patient/POA to call should there be a concern in the interim.

## 2019-06-27 ENCOUNTER — Other Ambulatory Visit: Payer: Self-pay | Admitting: Endocrinology

## 2019-06-27 ENCOUNTER — Other Ambulatory Visit: Payer: Self-pay | Admitting: Cardiology

## 2019-06-27 DIAGNOSIS — I272 Pulmonary hypertension, unspecified: Secondary | ICD-10-CM

## 2019-06-27 DIAGNOSIS — I6523 Occlusion and stenosis of bilateral carotid arteries: Secondary | ICD-10-CM

## 2019-06-27 DIAGNOSIS — I10 Essential (primary) hypertension: Secondary | ICD-10-CM

## 2019-06-27 DIAGNOSIS — I350 Nonrheumatic aortic (valve) stenosis: Secondary | ICD-10-CM

## 2019-06-27 DIAGNOSIS — G4733 Obstructive sleep apnea (adult) (pediatric): Secondary | ICD-10-CM

## 2019-06-27 DIAGNOSIS — I480 Paroxysmal atrial fibrillation: Secondary | ICD-10-CM

## 2019-06-27 MED ORDER — ATORVASTATIN CALCIUM 40 MG PO TABS: ORAL_TABLET | ORAL | 1 refills | Status: DC

## 2019-07-02 ENCOUNTER — Other Ambulatory Visit: Payer: Self-pay | Admitting: Cardiology

## 2019-07-02 DIAGNOSIS — Z23 Encounter for immunization: Secondary | ICD-10-CM | POA: Diagnosis not present

## 2019-07-02 MED ORDER — HYDROCHLOROTHIAZIDE 25 MG PO TABS: ORAL_TABLET | ORAL | 1 refills | Status: DC

## 2019-07-06 ENCOUNTER — Other Ambulatory Visit: Payer: Self-pay | Admitting: Endocrinology

## 2019-07-12 ENCOUNTER — Encounter: Payer: Self-pay | Admitting: Cardiology

## 2019-07-12 ENCOUNTER — Other Ambulatory Visit: Payer: Self-pay | Admitting: *Deleted

## 2019-07-12 ENCOUNTER — Other Ambulatory Visit: Payer: Self-pay

## 2019-07-12 ENCOUNTER — Ambulatory Visit (HOSPITAL_COMMUNITY): Payer: PPO | Attending: Cardiology

## 2019-07-12 DIAGNOSIS — I350 Nonrheumatic aortic (valve) stenosis: Secondary | ICD-10-CM | POA: Diagnosis not present

## 2019-07-13 ENCOUNTER — Telehealth: Payer: Self-pay | Admitting: *Deleted

## 2019-07-13 ENCOUNTER — Ambulatory Visit: Payer: PPO | Admitting: Cardiology

## 2019-07-13 NOTE — Telephone Encounter (Signed)
-----   Message from Sueanne Margarita, MD sent at 07/12/2019 10:06 PM EST ----- Echo showed normal LVF with trivial leakiness of TV and AV, mild AS - repeat echo in 1 year for AS

## 2019-07-13 NOTE — Telephone Encounter (Signed)
Pt has been notified of echo results. Pt states to me that she is wondering when she is supposed to see Dr. Radford Pax. I stated looks like a letter went out for her to see Dr. Radford Pax sometime this month. Pt states he had some chest pressure a few weeks. She said she felt better the next day, but would like to see Dr. Radford Pax. Dr. Radford Pax had 2 openings today 11:40 and 12:40 and pt opted for the 12:40 appt. Pt aware to wear her mask and to arrive by 12:25 pm for appt. Pt thanked me for the appt. The patient has been notified of the result and verbalized understanding.  All questions (if any) were answered. Julaine Hua, [facility] 07/13/2019 8:42 AM   I just received a message from Dr. Radford Pax that she will be out of the office at the 12:40 pm and pt will need to be rescheduled. I have left a message for the pt to call back and reschedule her appt. I apologize for the inconvenience.

## 2019-08-03 DIAGNOSIS — M5136 Other intervertebral disc degeneration, lumbar region: Secondary | ICD-10-CM | POA: Diagnosis not present

## 2019-08-03 DIAGNOSIS — M5440 Lumbago with sciatica, unspecified side: Secondary | ICD-10-CM | POA: Diagnosis not present

## 2019-08-03 DIAGNOSIS — M47816 Spondylosis without myelopathy or radiculopathy, lumbar region: Secondary | ICD-10-CM | POA: Diagnosis not present

## 2019-08-14 DIAGNOSIS — M25562 Pain in left knee: Secondary | ICD-10-CM | POA: Diagnosis not present

## 2019-08-16 ENCOUNTER — Other Ambulatory Visit (INDEPENDENT_AMBULATORY_CARE_PROVIDER_SITE_OTHER): Payer: PPO

## 2019-08-16 ENCOUNTER — Other Ambulatory Visit: Payer: Self-pay

## 2019-08-16 DIAGNOSIS — E1165 Type 2 diabetes mellitus with hyperglycemia: Secondary | ICD-10-CM

## 2019-08-16 DIAGNOSIS — Z794 Long term (current) use of insulin: Secondary | ICD-10-CM | POA: Diagnosis not present

## 2019-08-16 DIAGNOSIS — E063 Autoimmune thyroiditis: Secondary | ICD-10-CM | POA: Diagnosis not present

## 2019-08-16 LAB — BASIC METABOLIC PANEL
BUN: 23 mg/dL (ref 6–23)
CO2: 30 mEq/L (ref 19–32)
Calcium: 9.6 mg/dL (ref 8.4–10.5)
Chloride: 101 mEq/L (ref 96–112)
Creatinine, Ser: 1.18 mg/dL (ref 0.40–1.20)
GFR: 44.23 mL/min — ABNORMAL LOW (ref >60.00)
Glucose, Bld: 137 mg/dL — ABNORMAL HIGH (ref 70–99)
Potassium: 4.1 mEq/L (ref 3.5–5.1)
Sodium: 139 mEq/L (ref 135–145)

## 2019-08-16 LAB — TSH: TSH: 3.32 u[IU]/mL (ref 0.35–4.50)

## 2019-08-16 LAB — HEMOGLOBIN A1C: Hgb A1c MFr Bld: 6.8 % — ABNORMAL HIGH (ref 4.6–6.5)

## 2019-08-20 ENCOUNTER — Other Ambulatory Visit: Payer: Self-pay

## 2019-08-21 ENCOUNTER — Encounter: Payer: Self-pay | Admitting: Endocrinology

## 2019-08-21 ENCOUNTER — Other Ambulatory Visit: Payer: Self-pay

## 2019-08-21 ENCOUNTER — Ambulatory Visit (INDEPENDENT_AMBULATORY_CARE_PROVIDER_SITE_OTHER): Payer: PPO | Admitting: Endocrinology

## 2019-08-21 DIAGNOSIS — E1165 Type 2 diabetes mellitus with hyperglycemia: Secondary | ICD-10-CM | POA: Diagnosis not present

## 2019-08-21 DIAGNOSIS — Z794 Long term (current) use of insulin: Secondary | ICD-10-CM | POA: Diagnosis not present

## 2019-08-21 MED ORDER — "BD DISP NEEDLES 30G X 1/2"" MISC": 1.0000 | Freq: Two times a day (BID) | 5 refills | Status: DC

## 2019-08-21 NOTE — Progress Notes (Signed)
Patient ID: Tina Hester, female   DOB: Jul 12, 1941, 78 y.o.   MRN: [id number]   Today's office visit was provided via telemedicine using video technique Explained to the patient and the the limitations of evaluation and management by telemedicine and the availability of in person appointments.  The patient understood the limitations and agreed to proceed. Patient also understood that the telehealth visit is billable. . Location of the patient: Home . Location of the provider: Office Only the patient and myself were participating in the encounter   Reason for Appointment: Diabetes follow-up   History of Present Illness   Diagnosis: Type 2 DIABETES MELITUS, long-standing    Previous history:  She has been on insulin for a few years. Because of cost she has been switched from Lantus to NPH insulin twice a day She had previously been taking metformin but this was stopped when she had renal insufficiency Overall her blood sugars have been difficult to control and she has previously had relatively poor control. However with adding Actos her sugars had started improving significantly Also with her being very consistent with diet in 2015 her A1c had gone down to 6.4 To improve her control she was given a trial of the V-go pump in 2/17 but she had allergic skin reaction with the adhesive  Recent history:    Insulin regimen: Humulin N 20 units twice daily  Oral hypoglycemic drugs: Actos 15 mg, Metformin 500 mg in a.m.-1000 mg, Ozempic 0.5 mg weekly  Her A1c is improved at 6.8, previously 7.7  Current blood sugar patterns, management and problems identified:   Her meter has the wrong date and time programmed and difficult to identify which readings are at what time  She says she checks her sugars randomly at different times although not consistently after dinner  About 3 months ago her PCP started her on OZEMPIC since she was recommended weight loss because of her back  pain  She has increased the dose up to 0.5 weekly and has had no nausea  However she is generally eating smaller portions now; also may sometimes skip breakfast  Sometimes will not have any carbohydrate and yesterday only had a hot dog at lunch  However he did have a blood sugar of  203 soon after lunch with eating more carbohydrate  She has also reduced her insulin by 5 units twice daily  Not taking any regular insulin at all  She thinks she has lost 5 pounds  Currently not able to exercise because of back pain and is using a walker                 Side effects from medications:She did not tolerate Invokana and was having muscle cramps  Monitors blood glucose: occasionally     Glucometer:  FreeStyle        Blood Glucose readings from patient reporting blood sugars from her meter  Recent range 89-203, average not available  Previous readings:  PRE-MEAL Fasting Lunch Dinner Bedtime Overall  Glucose range:  109, 115  149     Mean/median:     ?   POST-MEAL PC Breakfast PC Lunch PC Dinner  Glucose range:  202  171  145-163  Mean/median:               Wt Readings from Last 3 Encounters:  01/23/19 230 lb (104.3 kg)  09/14/18 238 lb (108 kg)  06/15/18 246 lb (111.6 kg)   LABS:  Lab Results  Component Value Date   HGBA1C 6.8 (H) 08/16/2019   HGBA1C 7.7 (H) 03/13/2019   HGBA1C 8.0 (H) 12/13/2018   Lab Results  Component Value Date   MICROALBUR 2.9 (H) 12/13/2018   LDLCALC 50 01/10/2018   CREATININE 1.18 08/16/2019   Lab on 08/16/2019  Component Date Value Ref Range Status  . TSH 08/16/2019 3.32  0.35 - 4.50 uIU/mL Final  . Sodium 08/16/2019 139  135 - 145 mEq/L Final  . Potassium 08/16/2019 4.1  3.5 - 5.1 mEq/L Final  . Chloride 08/16/2019 101  96 - 112 mEq/L Final  . CO2 08/16/2019 30  19 - 32 mEq/L Final  . Glucose, Bld 08/16/2019 137* 70 - 99 mg/dL Final  . BUN 08/16/2019 23  6 - 23 mg/dL Final  . Creatinine, Ser 08/16/2019 1.18  0.40 - 1.20 mg/dL Final   . GFR 08/16/2019 44.23* >60.00 mL/min Final  . Calcium 08/16/2019 9.6  8.4 - 10.5 mg/dL Final  . Hgb A1c MFr Bld 08/16/2019 6.8* 4.6 - 6.5 % Final   Glycemic Control Guidelines for People with Diabetes:Non Diabetic:  <6%Goal of Therapy: <7%Additional Action Suggested:  >8%      Allergies as of 08/21/2019      Reactions   Pneumococcal Vaccine Anaphylaxis   weakness   Pneumovax 23 [pneumococcal Vac Polyvalent] Anaphylaxis   weakness   Other Hives   Other reaction(s): Other (See Comments) Pneumonia  Vaccine- very ill   Sulfa Antibiotics Hives      Medication List       Accurate as of August 21, 2019  4:07 PM. If you have any questions, ask your nurse or doctor.        acetaminophen 325 MG tablet Commonly known as: TYLENOL Take by mouth every 6 (six) hours as needed for mild pain or moderate pain.   acyclovir 400 MG tablet Commonly known as: ZOVIRAX Take 400 mg by mouth 2 (two) times daily.   ALPRAZolam 0.25 MG tablet Commonly known as: XANAX Take 0.25 mg by mouth at bedtime as needed for anxiety.   amLODipine 5 MG tablet Commonly known as: NORVASC Take 1 tablet (5 mg total) by mouth daily.   atorvastatin 40 MG tablet Commonly known as: LIPITOR TAKE 1 TABLET(40 MG) BY MOUTH DAILY AT 6 PM   CALCIUM PO Take 1 tablet by mouth daily. 600 mg   Cholecalciferol 50 MCG (2000 UT) Tabs Take 4,000 Units by mouth daily.   Colcrys 0.6 MG tablet Generic drug: colchicine Take 0.6 mg by mouth daily as needed (gout).   Eliquis 5 MG Tabs tablet Generic drug: apixaban TAKE 1 TABLET(5 MG) BY MOUTH TWICE DAILY What changed: See the new instructions.   gabapentin 100 MG capsule Commonly known as: NEURONTIN Take 100-200 mg by mouth at bedtime.   glucose blood test strip Commonly known as: FREESTYLE LITE Use to test blood sugar twice daily. Dx: E11.9   hydrochlorothiazide 25 MG tablet Commonly known as: HYDRODIURIL TAKE 1 TABLET(25 MG) BY MOUTH DAILY   hydrocortisone  cream 1 % Apply 1 application topically daily as needed for itching.   insulin NPH Human 100 UNIT/ML injection Commonly known as: NOVOLIN N Inject 25 Units into the skin 2 (two) times daily before a meal. INJECT 25 UNITS UNDER THE SKIN TWICE DAILY WITH MEALS.   insulin regular 100 units/mL injection Commonly known as: NovoLIN R ReliOn Inject 0.06-0.1 mLs (6-10 Units total) into the skin 3 (three) times daily before meals.   INSULIN SYRINGE  1CC/31GX5/16" 31G X 5/16" 1 ML Misc USE THREE TIMES DAILY TO INJECT INSULIN   levothyroxine 137 MCG tablet Commonly known as: SYNTHROID Take 137 mcg by mouth daily before breakfast.   magnesium 30 MG tablet Take 30 mg by mouth at bedtime.   metFORMIN 500 MG tablet Commonly known as: GLUCOPHAGE TAKE 1 TABLET BY MOUTH EVERY MORNING AND 2 TABLETS EVERY EVENING   metoprolol succinate 25 MG 24 hr tablet Commonly known as: TOPROL-XL TAKE 1 TABLET BY MOUTH TWICE DAILY What changed:   when to take this  additional instructions   NEEDLE (DISP) 30 G 30G X 1/2" Misc Commonly known as: BD Disp Needles 1 each by Does not apply route 2 (two) times daily before a meal.   OneTouch Delica Lancets 99991111 Misc Use to check blood sugars once daily   pioglitazone 15 MG tablet Commonly known as: ACTOS TAKE 1 TABLET(15 MG) BY MOUTH DAILY   prednisoLONE acetate 1 % ophthalmic suspension Commonly known as: PRED FORTE Place 2 drops into the right eye 4 (four) times daily.   timolol 0.5 % ophthalmic solution Commonly known as: TIMOPTIC Place 1 drop into both eyes daily.   vitamin B-12 1000 MCG tablet Commonly known as: CYANOCOBALAMIN Take 1,000 mcg by mouth daily.   vitamin C 500 MG tablet Commonly known as: ASCORBIC ACID Take 500 mg by mouth daily.       Allergies:  Allergies  Allergen Reactions  . Pneumococcal Vaccine Anaphylaxis    weakness  . Pneumovax 23 [Pneumococcal Vac Polyvalent] Anaphylaxis    weakness  . Other Hives    Other  reaction(s): Other (See Comments) Pneumonia  Vaccine- very ill  . Sulfa Antibiotics Hives    Past Medical History:  Diagnosis Date  . Anemia    s/p Heme work -up normal EGD and colonscopy in 2012 per pt,neg SPEP  . Anxiety   . Aortic stenosis    mild by echo 07/2019  . Bilateral carotid artery stenosis 06/08/2015   1-39% bilateral  . Bradycardia   . CKD (chronic kidney disease), stage III   . Diabetes mellitus without complication ([place])    type 2  . GERD (gastroesophageal reflux disease)   . Gout   . HSV-1 (herpes simplex virus 1) infection    Acyclovir prn  . Hyperkalemia   . Hyperlipidemia   . Hypertension   . Joint pain    osteoarthritis by Xray- possible erosion of R 4th MCP,elevated uric  acid ,ANA +   . Morbid obesity ([place])   . OSA on CPAP   . Osteopenia   . PAF (paroxysmal atrial fibrillation) ([place])   . Pain management    Neurosurg: Dr Clydell Hakim  . Pulmonary HTN ([place])    Moderate by echo  2016. PASP 16mmHg  . TIA (transient ischemic attack)    remote history of TIA's early 2000    Past Surgical History:  Procedure Laterality Date  . ABDOMINAL HYSTERECTOMY    . APPENDECTOMY    . BACK SURGERY     cervical fusion  . BLADDER SURGERY    . CESAREAN SECTION    . CHOLECYSTECTOMY      Family History  Problem Relation Age of Onset  . Arrhythmia Brother     Social History:  reports that she has never smoked. She has never used smokeless tobacco. She reports that she does not drink alcohol or use drugs.  Review of Systems:   Hypertension:   This has been managed  by her PCP or nephrologist She is taking amlodipine 5 mg, metoprolol 25 mg and HCTZ 25 mg  BP Readings from Last 3 Encounters:  01/29/19 (!) 150/95  01/23/19 130/71  09/14/18 130/70   Lab Results  Component Value Date   CREATININE 1.18 08/16/2019   CREATININE 1.25 (H) 03/13/2019   CREATININE 1.17 12/13/2018     Lipids: Triglycerides have been high to a variable extent LDL has been  below 100 with Lipitor 40 mg prescribed by her cardiologist  Lab Results  Component Value Date   CHOL 140 03/13/2019   HDL 29.30 (L) 03/13/2019   LDLCALC 50 01/10/2018   LDLDIRECT 80.0 03/13/2019   TRIG 239.0 (H) 03/13/2019   CHOLHDL 5 03/13/2019    Renal insufficiency: Her creatinine is relatively better now  Has seen nephrologist also Lisinopril caused hyperkalemia previously  Lab Results  Component Value Date   CREATININE 1.18 08/16/2019   CREATININE 1.25 (H) 03/13/2019   CREATININE 1.17 12/13/2018     She has hypothyroidism and is taking 137 mcg daily from her PCP  Last TSH as follows  She is regular with taking her supplement before breakfast  Lab Results  Component Value Date   TSH 3.32 08/16/2019   TSH 1.84 12/13/2018   TSH 5.34 (H) 09/11/2018   FREET4 1.15 12/13/2018   FREET4 1.52 01/10/2018   FREET4 1.24 07/03/2015     She has been on gabapentin 100 mg as needed for mild burning and tingling      Examination:   There were no vitals taken for this visit.  There is no height or weight on file to calculate BMI.     ASSESSMENT/ PLAN:   Diabetes type 2 with obesity, insulin requiring:    See history of present illness for detailed discussion of  current management, blood sugar patterns and issues identified  Her A1c is much improved at 6.8  She is now on Ozempic which has improved her blood sugar control Although previously she had been reluctant to take any brand-name medications she is not able to afford this Also has reduced her insulin by 5 units twice daily and does not appear to be having any consistent postprandial hyperglycemia either Blood sugar monitoring is sporadic and her meter does not have the right time program to analyze the patterns  She is concerned about lack of significant weight loss, however she does not exercise Also occasionally may have low normal sugars in the morning May consider stopping Actos on the next visit   Discussed need to check blood sugars consistently twice a day at different times If her morning sugars are staying below 100 consistently she can reduce her bedtime NPH to 18 units Follow-up in 3 months and continue Ozempic, Metformin and Actos   RENAL dysfunction: Improved  HYPOTHYROIDISM: Well-controlled    There are no Patient Instructions on file for this visit.   Elayne Snare 08/21/2019, 4:07 PM

## 2019-08-27 ENCOUNTER — Other Ambulatory Visit: Payer: Self-pay

## 2019-08-27 MED ORDER — METOPROLOL SUCCINATE ER 25 MG PO TB24: 25.0000 mg | ORAL_TABLET | Freq: Two times a day (BID) | ORAL | 1 refills | Status: DC

## 2019-09-11 DIAGNOSIS — M9903 Segmental and somatic dysfunction of lumbar region: Secondary | ICD-10-CM | POA: Diagnosis not present

## 2019-09-11 DIAGNOSIS — M5432 Sciatica, left side: Secondary | ICD-10-CM | POA: Diagnosis not present

## 2019-09-12 DIAGNOSIS — M9903 Segmental and somatic dysfunction of lumbar region: Secondary | ICD-10-CM | POA: Diagnosis not present

## 2019-09-12 DIAGNOSIS — M5432 Sciatica, left side: Secondary | ICD-10-CM | POA: Diagnosis not present

## 2019-09-13 DIAGNOSIS — M5432 Sciatica, left side: Secondary | ICD-10-CM | POA: Diagnosis not present

## 2019-09-13 DIAGNOSIS — M9903 Segmental and somatic dysfunction of lumbar region: Secondary | ICD-10-CM | POA: Diagnosis not present

## 2019-09-17 DIAGNOSIS — M5432 Sciatica, left side: Secondary | ICD-10-CM | POA: Diagnosis not present

## 2019-09-17 DIAGNOSIS — M9903 Segmental and somatic dysfunction of lumbar region: Secondary | ICD-10-CM | POA: Diagnosis not present

## 2019-09-19 DIAGNOSIS — M9903 Segmental and somatic dysfunction of lumbar region: Secondary | ICD-10-CM | POA: Diagnosis not present

## 2019-09-19 DIAGNOSIS — M5432 Sciatica, left side: Secondary | ICD-10-CM | POA: Diagnosis not present

## 2019-09-24 ENCOUNTER — Other Ambulatory Visit: Payer: Self-pay

## 2019-09-24 ENCOUNTER — Telehealth: Payer: Self-pay

## 2019-09-24 DIAGNOSIS — M5432 Sciatica, left side: Secondary | ICD-10-CM | POA: Diagnosis not present

## 2019-09-24 DIAGNOSIS — M9903 Segmental and somatic dysfunction of lumbar region: Secondary | ICD-10-CM | POA: Diagnosis not present

## 2019-09-24 MED ORDER — NOVOLIN N FLEXPEN 100 UNIT/ML ~~LOC~~ SUPN: 25.0000 [IU] | PEN_INJECTOR | Freq: Two times a day (BID) | SUBCUTANEOUS | 2 refills | Status: DC

## 2019-09-24 NOTE — Telephone Encounter (Signed)
MEDICATION: Novolin N pens  PHARMACY: [facility] [place]   IS THIS A 90 DAY SUPPLY : no  IS PATIENT OUT OF MEDICATION: yes  IF NOT; HOW MUCH IS LEFT: 0  LAST APPOINTMENT DATE: @12 /22/2020  NEXT APPOINTMENT DATE:@ 11/26/19  DO WE HAVE YOUR PERMISSION TO LEAVE A DETAILED MESSAGE:yes OTHER COMMENTS:    **Let patient know to contact pharmacy at the end of the day to make sure medication is ready. **  ** Please notify patient to allow 48-72 hours to process**  **Encourage patient to contact the pharmacy for refills or they can request refills through [facility]**

## 2019-09-24 NOTE — Telephone Encounter (Signed)
Rx sent 

## 2019-09-25 ENCOUNTER — Ambulatory Visit: Payer: PPO | Admitting: Podiatry

## 2019-09-25 ENCOUNTER — Other Ambulatory Visit: Payer: Self-pay | Admitting: Cardiology

## 2019-09-25 NOTE — Telephone Encounter (Signed)
Age 79, weight 104kg, SCr 1.18 on 08/16/19, last OV June 2020, afib indication

## 2019-09-26 DIAGNOSIS — M9903 Segmental and somatic dysfunction of lumbar region: Secondary | ICD-10-CM | POA: Diagnosis not present

## 2019-09-26 DIAGNOSIS — M5432 Sciatica, left side: Secondary | ICD-10-CM | POA: Diagnosis not present

## 2019-10-01 DIAGNOSIS — M5432 Sciatica, left side: Secondary | ICD-10-CM | POA: Diagnosis not present

## 2019-10-01 DIAGNOSIS — M9903 Segmental and somatic dysfunction of lumbar region: Secondary | ICD-10-CM | POA: Diagnosis not present

## 2019-10-03 DIAGNOSIS — M5432 Sciatica, left side: Secondary | ICD-10-CM | POA: Diagnosis not present

## 2019-10-03 DIAGNOSIS — M9903 Segmental and somatic dysfunction of lumbar region: Secondary | ICD-10-CM | POA: Diagnosis not present

## 2019-10-08 DIAGNOSIS — M5432 Sciatica, left side: Secondary | ICD-10-CM | POA: Diagnosis not present

## 2019-10-08 DIAGNOSIS — M9903 Segmental and somatic dysfunction of lumbar region: Secondary | ICD-10-CM | POA: Diagnosis not present

## 2019-10-10 ENCOUNTER — Ambulatory Visit (INDEPENDENT_AMBULATORY_CARE_PROVIDER_SITE_OTHER): Payer: HMO | Admitting: Podiatry

## 2019-10-10 ENCOUNTER — Other Ambulatory Visit: Payer: Self-pay

## 2019-10-10 ENCOUNTER — Encounter: Payer: Self-pay | Admitting: Podiatry

## 2019-10-10 DIAGNOSIS — L84 Corns and callosities: Secondary | ICD-10-CM | POA: Diagnosis not present

## 2019-10-10 DIAGNOSIS — B351 Tinea unguium: Secondary | ICD-10-CM | POA: Diagnosis not present

## 2019-10-10 DIAGNOSIS — E119 Type 2 diabetes mellitus without complications: Secondary | ICD-10-CM

## 2019-10-10 DIAGNOSIS — Z794 Long term (current) use of insulin: Secondary | ICD-10-CM | POA: Diagnosis not present

## 2019-10-10 DIAGNOSIS — M79675 Pain in left toe(s): Secondary | ICD-10-CM

## 2019-10-10 DIAGNOSIS — M79674 Pain in right toe(s): Secondary | ICD-10-CM | POA: Diagnosis not present

## 2019-10-10 NOTE — Patient Instructions (Signed)
Corns and Calluses Corns are small areas of thickened skin that occur on the top, sides, or tip of a toe. They contain a cone-shaped core with a point that can press on a nerve below. This causes pain.  Calluses are areas of thickened skin that can occur anywhere on the body, including the hands, fingers, palms, soles of the feet, and heels. Calluses are usually larger than corns. What are the causes? Corns and calluses are caused by rubbing (friction) or pressure, such as from shoes that are too tight or do not fit properly. What increases the risk? Corns are more likely to develop in people who have misshapen toes (toe deformities), such as hammer toes. Calluses can occur with friction to any area of the skin. They are more likely to develop in people who:  Work with their hands.  Wear shoes that fit poorly, are too tight, or are high-heeled.  Have toe deformities. What are the signs or symptoms? Symptoms of a corn or callus include:  A hard growth on the skin.  Pain or tenderness under the skin.  Redness and swelling.  Increased discomfort while wearing tight-fitting shoes, if your feet are affected. If a corn or callus becomes infected, symptoms may include:  Redness and swelling that gets worse.  Pain.  Fluid, blood, or pus draining from the corn or callus. How is this diagnosed? Corns and calluses may be diagnosed based on your symptoms, your medical history, and a physical exam. How is this treated? Treatment for corns and calluses may include:  Removing the cause of the friction or pressure. This may involve: ? Changing your shoes. ? Wearing shoe inserts (orthotics) or other protective layers in your shoes, such as a corn pad. ? Wearing gloves.  Applying medicine to the skin (topical medicine) to help soften skin in the hardened, thickened areas.  Removing layers of dead skin with a file to reduce the size of the corn or callus.  Removing the corn or callus with a  scalpel or laser.  Taking antibiotic medicines, if your corn or callus is infected.  Having surgery, if a toe deformity is the cause. Follow these instructions at home:   Take over-the-counter and prescription medicines only as told by your health care provider.  If you were prescribed an antibiotic, take it as told by your health care provider. Do not stop taking it even if your condition starts to improve.  Wear shoes that fit well. Avoid wearing high-heeled shoes and shoes that are too tight or too loose.  Wear any padding, protective layers, gloves, or orthotics as told by your health care provider.  Soak your hands or feet and then use a file or pumice stone to soften your corn or callus. Do this as told by your health care provider.  Check your corn or callus every day for symptoms of infection. Contact a health care provider if you:  Notice that your symptoms do not improve with treatment.  Have redness or swelling that gets worse.  Notice that your corn or callus becomes painful.  Have fluid, blood, or pus coming from your corn or callus.  Have new symptoms. Summary  Corns are small areas of thickened skin that occur on the top, sides, or tip of a toe.  Calluses are areas of thickened skin that can occur anywhere on the body, including the hands, fingers, palms, and soles of the feet. Calluses are usually larger than corns.  Corns and calluses are caused by   rubbing (friction) or pressure, such as from shoes that are too tight or do not fit properly.  Treatment may include wearing any padding, protective layers, gloves, or orthotics as told by your health care provider. This information is not intended to replace advice given to you by your health care provider. Make sure you discuss any questions you have with your health care provider. Document Revised: 12/06/2018 Document Reviewed: 06/29/2017 Elsevier Patient Education  [place].  Diabetes Mellitus and  [place] care is an important part of your health, especially when you have diabetes. Diabetes may cause you to have problems because of poor blood flow (circulation) to your feet and legs, which can cause your skin to:  Become thinner and drier.  Break more easily.  Heal more slowly.  Peel and crack. You may also have nerve damage (neuropathy) in your legs and feet, causing decreased feeling in them. This means that you may not notice minor injuries to your feet that could lead to more serious problems. Noticing and addressing any potential problems early is the best way to prevent future foot problems. How to care for your feet Foot hygiene  Wash your feet daily with warm water and mild soap. Do not use hot water. Then, pat your feet and the areas between your toes until they are completely dry. Do not soak your feet as this can dry your skin.  Trim your toenails straight across. Do not dig under them or around the cuticle. File the edges of your nails with an emery board or nail file.  Apply a moisturizing lotion or petroleum jelly to the skin on your feet and to dry, brittle toenails. Use lotion that does not contain alcohol and is unscented. Do not apply lotion between your toes. Shoes and socks  Wear clean socks or stockings every day. Make sure they are not too tight. Do not wear knee-high stockings since they may decrease blood flow to your legs.  Wear shoes that fit properly and have enough cushioning. Always look in your shoes before you put them on to be sure there are no objects inside.  To break in new shoes, wear them for just a few hours a day. This prevents injuries on your feet. Wounds, scrapes, corns, and calluses  Check your feet daily for blisters, cuts, bruises, sores, and redness. If you cannot see the bottom of your feet, use a mirror or ask someone for help.  Do not cut corns or calluses or try to remove them with medicine.  If you find a minor scrape,  cut, or break in the skin on your feet, keep it and the skin around it clean and dry. You may clean these areas with mild soap and water. Do not clean the area with peroxide, alcohol, or iodine.  If you have a wound, scrape, corn, or callus on your foot, look at it several times a day to make sure it is healing and not infected. Check for: ? Redness, swelling, or pain. ? Fluid or blood. ? Warmth. ? Pus or a bad smell. General instructions  Do not cross your legs. This may decrease blood flow to your feet.  Do not use heating pads or hot water bottles on your feet. They may burn your skin. If you have lost feeling in your feet or legs, you may not know this is happening until it is too late.  Protect your feet from hot and cold by wearing shoes, such as at  the beach or on hot pavement.  Schedule a complete foot exam at least once a year (annually) or more often if you have foot problems. If you have foot problems, report any cuts, sores, or bruises to your health care provider immediately. Contact a health care provider if:  You have a medical condition that increases your risk of infection and you have any cuts, sores, or bruises on your feet.  You have an injury that is not healing.  You have redness on your legs or feet.  You feel burning or tingling in your legs or feet.  You have pain or cramps in your legs and feet.  Your legs or feet are numb.  Your feet always feel cold.  You have pain around a toenail. Get help right away if:  You have a wound, scrape, corn, or callus on your foot and: ? You have pain, swelling, or redness that gets worse. ? You have fluid or blood coming from the wound, scrape, corn, or callus. ? Your wound, scrape, corn, or callus feels warm to the touch. ? You have pus or a bad smell coming from the wound, scrape, corn, or callus. ? You have a fever. ? You have a red line going up your leg. Summary  Check your feet every day for cuts, sores, red  spots, swelling, and blisters.  Moisturize feet and legs daily.  Wear shoes that fit properly and have enough cushioning.  If you have foot problems, report any cuts, sores, or bruises to your health care provider immediately.  Schedule a complete foot exam at least once a year (annually) or more often if you have foot problems. This information is not intended to replace advice given to you by your health care provider. Make sure you discuss any questions you have with your health care provider. Document Revised: 05/09/2019 Document Reviewed: 09/17/2016 Elsevier Patient Education  [place].

## 2019-10-10 NOTE — Progress Notes (Signed)
Subjective: Tina Hester presents today for follow up of preventative diabetic foot care and callus(es) plantarly b/l and painful mycotic toenails b/l that are difficult to trim. Pain interferes with ambulation. Aggravating factors include wearing enclosed shoe gear. Pain is relieved with periodic professional debridement..   Allergies  Allergen Reactions  . Pneumococcal Vaccine Anaphylaxis    weakness  . Pneumovax 23 [Pneumococcal Vac Polyvalent] Anaphylaxis    weakness  . Other Hives    Other reaction(s): Other (See Comments) Pneumonia  Vaccine- very ill  . Sulfa Antibiotics Hives     Objective: There were no vitals filed for this visit.  Vascular Examination:  Capillary refill time to digits immediate b/l, palpable DP pulses b/l, palpable PT pulses b/l, pedal hair absent b/l and skin temperature gradient within normal limits b/l  Dermatological Examination: Pedal skin with normal turgor, texture and tone bilaterally, no open wounds bilaterally, no interdigital macerations bilaterally, toenails 1-5 b/l elongated, dystrophic, thickened, crumbly with subungual debris and hyperkeratotic lesion(s) submet head 1 b/l.  No erythema, no edema, no drainage, no flocculence .  Incurvated nailplate left great toe medial border(s) with tenderness to palpation. No erythema, no edema, no drainage noted.  Musculoskeletal: Normal muscle strength 5/5 to all lower extremity muscle groups bilaterally, no gross bony deformities bilaterally, no pain crepitus or joint limitation noted with ROM b/l, hammertoes noted to the  b/l 5th digit and dorsal exostosis dorsal aspect of right midfoot. No erythema, no edema, no drainage.  Neurological: Protective sensation intact 5/5 intact bilaterally with 10g monofilament b/l and vibratory sensation intact b/l  Assessment: Pain due to onychomycosis of toenails of both feet  Calluses submet head 1 b/l  Ingrown toenail medial border left hallux,  noninfected  Type 2 diabetes mellitus without complication, with long-term current use of insulin ([place])  Plan: -Continue diabetic foot care principles. Literature dispensed on today.  -Toenails 1-5 b/l were debrided in length and girth without iatrogenic bleeding.  Incurvated nailplate left great toe medial border(s) with tenderness to palpation. No erythema, no edema, no drainage noted. -Calluses were debrided without complication or incident. Total number debrided =2: submet head 1 b/l  -Patient to continue soft, supportive shoe gear daily. -Patient to report any pedal injuries to medical professional immediately. -Patient/POA to call should there be question/concern in the interim.  Return in about 3 months (around 01/07/2020) for diabetic nail and callus trim.

## 2019-10-11 DIAGNOSIS — M9903 Segmental and somatic dysfunction of lumbar region: Secondary | ICD-10-CM | POA: Diagnosis not present

## 2019-10-11 DIAGNOSIS — M5432 Sciatica, left side: Secondary | ICD-10-CM | POA: Diagnosis not present

## 2019-10-15 DIAGNOSIS — M9903 Segmental and somatic dysfunction of lumbar region: Secondary | ICD-10-CM | POA: Diagnosis not present

## 2019-10-15 DIAGNOSIS — M5432 Sciatica, left side: Secondary | ICD-10-CM | POA: Diagnosis not present

## 2019-10-24 DIAGNOSIS — M5432 Sciatica, left side: Secondary | ICD-10-CM | POA: Diagnosis not present

## 2019-10-24 DIAGNOSIS — M9903 Segmental and somatic dysfunction of lumbar region: Secondary | ICD-10-CM | POA: Diagnosis not present

## 2019-10-30 DIAGNOSIS — M5432 Sciatica, left side: Secondary | ICD-10-CM | POA: Diagnosis not present

## 2019-10-30 DIAGNOSIS — M9903 Segmental and somatic dysfunction of lumbar region: Secondary | ICD-10-CM | POA: Diagnosis not present

## 2019-11-08 ENCOUNTER — Other Ambulatory Visit: Payer: Self-pay | Admitting: *Deleted

## 2019-11-08 NOTE — Patient Outreach (Addendum)
[facility]) Care Management Chronic Special Needs Program    11/08/2019  Name: Tina Hester, DOB: 06-07-1941  MRN: [id number]   Tina Hester is enrolled in a chronic special needs plan for Diabetes.  Health risk assessment completed, individualized care plan created from information in EMR.  Client also has history of heart failure.  Will send introductory letter with individualized care plan to primary care provider and client, along with education materials.  Assigned RN care coordinator will follow up within 6 months.  Goals    .  Acknowledge receipt of Art gallery manager will mail client advanced directives Contact your assigned RN care manager at [phone] if you need assistance    . Client understands the importance of follow-up with providers by attending scheduled visits     Client's last visit to primary care provider 07/02/19, endocrinologist 08/21/19 Continue to attend all scheduled follow up appointments    . Client verbalize knowledge of Heart Failure disease self management skills by 6 months     RN Care manager will send client EMMI education article on Heart Failure and Atrial Fibrillation,  Weigh daily and record weights Adhere to low salt diet RN CM will send client HTA calendar which includes heart failure action plan    . Client will report no worsening of symptoms of Atrial Fibrillation within the next 6 months     RN care manager will send client [employer] calendar including atrial fibrillation action plan RN CM will send EMMI education article on Heart Failure and Atrial fibrillation    . Client will report no worsening of symptoms related to heart disease within the next 6 months     RN care manager will send client EMMI article on Heart disease in diabetics    . Client will verbalize knowledge of self management of Hypertension as evidences by BP reading of 140/90 or less; or as defined  by provider     RN care manager will send EMMI education article High Blood Pressure: what you can do, Diabetes and Blood pressure Take your medications as prescribed Follow up with your provider as recommended     . Client/Caregiver will verbalize understanding of instructions related to self-care and safety     RN care manager will send client safety pamphlet RN care manager will send EMMI educational article on How to help your memory    . HEMOGLOBIN A1C < 7.0     Discussed diabetes self management actions:  Glucose monitoring per provider recommendation  Visit provider every 3-6 months as directed  Hbg A1C level every 3-6 months.  Carbohydrate controlled meal planning  Taking diabetes medication as prescribed by provider  Physical activity    . Maintain timely refills of diabetic medication as prescribed within the year .     Take medications as prescribed Follow up with your doctor if you have questions Contact your assigned RN care manager if you have difficulty obtaining medications    . Obtain annual  Lipid Profile, LDL-C     Lipid profile completed 03/13/19 Continue to keep follow up appointments with your provider and have labwork completed as recommended    . Obtain Annual Eye (retinal)  Exam      Please schedule follow up appointment with eye doctor    . Obtain Annual Foot Exam     Client saw podiatrist 08/09/19 Diabetes foot care- check feet daily at home (check for skin  color changes, cuts, sores on the skin, swelling of feet or ankles, ingrown or fungal toenails, corns or callouses) Report these findings to your doctor Wash feet with soap and water, dry feet well, especially between toes. Moisturize your feet but not between toes. Always wear shoes that protect your whole feet.    . Obtain annual screen for micro albuminuria (urine) , nephropathy (kidney problems)     Annual microalbumin completed 12/13/18 Continue to obtain yearly physical and lab checks as  recommended by provider.    . Obtain Hemoglobin A1C at least 2 times per year     Hemoglobin AIC completed on 08/16/19 Continue to keep follow up appointments with provider and have labwork completed as recommended.    . Patient Stated " Be more active" (pt-stated)     Before beginning any physical activity/ exercise program, please speak with your doctor     . Visit Primary Care Provider or Endocrinologist at least 2 times per year      Last primary care provider visit 07/02/19, last endocrinology visit 08/21/19 Call and schedule yearly physical and follow up visits as recommended by your providers.       Jacqlyn Larsen [facility], [place] Coordinator [phone]

## 2019-11-22 ENCOUNTER — Other Ambulatory Visit (INDEPENDENT_AMBULATORY_CARE_PROVIDER_SITE_OTHER): Payer: HMO

## 2019-11-22 ENCOUNTER — Other Ambulatory Visit: Payer: Self-pay

## 2019-11-22 DIAGNOSIS — E1165 Type 2 diabetes mellitus with hyperglycemia: Secondary | ICD-10-CM

## 2019-11-22 DIAGNOSIS — Z794 Long term (current) use of insulin: Secondary | ICD-10-CM | POA: Diagnosis not present

## 2019-11-22 LAB — COMPREHENSIVE METABOLIC PANEL
ALT: 14 U/L (ref 0–35)
AST: 17 U/L (ref 0–37)
Albumin: 3.9 g/dL (ref 3.5–5.2)
Alkaline Phosphatase: 61 U/L (ref 39–117)
BUN: 26 mg/dL — ABNORMAL HIGH (ref 6–23)
CO2: 30 mEq/L (ref 19–32)
Calcium: 10.2 mg/dL (ref 8.4–10.5)
Chloride: 99 mEq/L (ref 96–112)
Creatinine, Ser: 1.38 mg/dL — ABNORMAL HIGH (ref 0.40–1.20)
GFR: 36.89 mL/min — ABNORMAL LOW (ref >60.00)
Glucose, Bld: 167 mg/dL — ABNORMAL HIGH (ref 70–99)
Potassium: 4.2 mEq/L (ref 3.5–5.1)
Sodium: 137 mEq/L (ref 135–145)
Total Bilirubin: 0.7 mg/dL (ref 0.2–1.2)
Total Protein: 7 g/dL (ref 6.0–8.3)

## 2019-11-22 LAB — HEMOGLOBIN A1C: Hgb A1c MFr Bld: 6.3 % (ref 4.6–6.5)

## 2019-11-22 LAB — MICROALBUMIN / CREATININE URINE RATIO
Creatinine,U: 153.4 mg/dL
Microalb Creat Ratio: 4 mg/g (ref 0.0–30.0)
Microalb, Ur: 6.2 mg/dL — ABNORMAL HIGH (ref 0.0–1.9)

## 2019-11-26 ENCOUNTER — Other Ambulatory Visit: Payer: Self-pay

## 2019-11-26 ENCOUNTER — Encounter: Payer: Self-pay | Admitting: Endocrinology

## 2019-11-26 ENCOUNTER — Ambulatory Visit (INDEPENDENT_AMBULATORY_CARE_PROVIDER_SITE_OTHER): Payer: HMO | Admitting: Endocrinology

## 2019-11-26 VITALS — BP 138/70 | HR 97 | Ht 61.5 in | Wt 231.0 lb

## 2019-11-26 DIAGNOSIS — E782 Mixed hyperlipidemia: Secondary | ICD-10-CM

## 2019-11-26 DIAGNOSIS — Z794 Long term (current) use of insulin: Secondary | ICD-10-CM | POA: Diagnosis not present

## 2019-11-26 DIAGNOSIS — E1165 Type 2 diabetes mellitus with hyperglycemia: Secondary | ICD-10-CM

## 2019-11-26 DIAGNOSIS — I10 Essential (primary) hypertension: Secondary | ICD-10-CM | POA: Diagnosis not present

## 2019-11-26 LAB — GLUCOSE, POCT (MANUAL RESULT ENTRY): POC Glucose: 140 mg/dl — AB (ref 70–99)

## 2019-11-26 NOTE — Progress Notes (Signed)
Patient ID: Tina Hester, female   DOB: Apr 01, 1941, 79 y.o.   MRN: [id number]    Reason for Appointment: Diabetes follow-up   History of Present Illness   Diagnosis: Type 2 DIABETES MELITUS, long-standing    Previous history:  She has been on insulin for a few years. Because of cost she has been switched from Lantus to NPH insulin twice a day She had previously been taking metformin but this was stopped when she had renal insufficiency Overall her blood sugars have been difficult to control and she has previously had relatively poor control. However with adding Actos her sugars had started improving significantly Also with her being very consistent with diet in 2015 her A1c had gone down to 6.4 To improve her control she was given a trial of the V-go pump in 2/17 but she had allergic skin reaction with the adhesive  Recent history:    Insulin regimen: Humulin N 18 units twice daily  Oral hypoglycemic drugs: Actos 15 mg, Metformin 500 mg in a.m.-1000 mg, Ozempic 0.5 mg weekly  Her A1c is improved at 6.3, previously as high as 7.7  Current blood sugar patterns, management and problems identified:   Her meter was not downloaded today as she forgot to bring this, she still thinks it has the incorrect date and time  Although she thinks her blood sugars are higher than before she believes they are mostly around 120 fasting  Because of her weight loss and low normal sugar that time she was told to reduce her bedtime dose to 18 units but she has reduced her morning dose also  Her lab glucose however late morning was 167  She thinks she is continuing to lose weight, previously had gone up to 245 pounds  This is the help of Ozempic which helps her with portion control  She has however not checked enough readings after meals to know if she has any high readings  She is trying to do a little walking, is more comfortable moving around with her walker  No nausea with  Ozempic                 Side effects from medications:She did not tolerate Invokana and was having muscle cramps  Monitors blood glucose: occasionally     Glucometer:  FreeStyle        Blood Glucose readings from recall: Mostly morning readings are 115-125 range and after meals about 140 She has a relatively old monitor        Wt Readings from Last 3 Encounters:  11/26/19 231 lb (104.8 kg)  01/23/19 230 lb (104.3 kg)  09/14/18 238 lb (108 kg)   LABS:  Lab Results  Component Value Date   HGBA1C 6.3 11/22/2019   HGBA1C 6.8 (H) 08/16/2019   HGBA1C 7.7 (H) 03/13/2019   Lab Results  Component Value Date   MICROALBUR 6.2 (H) 11/22/2019   LDLCALC 50 01/10/2018   CREATININE 1.38 (H) 11/22/2019   Office Visit on 11/26/2019  Component Date Value Ref Range Status  . POC Glucose 11/26/2019 140* 70 - 99 mg/dl Final  Lab on 11/22/2019  Component Date Value Ref Range Status  . Sodium 11/22/2019 137  135 - 145 mEq/L Final  . Potassium 11/22/2019 4.2  3.5 - 5.1 mEq/L Final  . Chloride 11/22/2019 99  96 - 112 mEq/L Final  . CO2 11/22/2019 30  19 - 32 mEq/L Final  . Glucose, Bld 11/22/2019 167* 70 - 99 mg/dL  Final  . BUN 11/22/2019 26* 6 - 23 mg/dL Final  . Creatinine, Ser 11/22/2019 1.38* 0.40 - 1.20 mg/dL Final  . Total Bilirubin 11/22/2019 0.7  0.2 - 1.2 mg/dL Final  . Alkaline Phosphatase 11/22/2019 61  39 - 117 U/L Final  . AST 11/22/2019 17  0 - 37 U/L Final  . ALT 11/22/2019 14  0 - 35 U/L Final  . Total Protein 11/22/2019 7.0  6.0 - 8.3 g/dL Final  . Albumin 11/22/2019 3.9  3.5 - 5.2 g/dL Final  . GFR 11/22/2019 36.89* >60.00 mL/min Final  . Calcium 11/22/2019 10.2  8.4 - 10.5 mg/dL Final  . Hgb A1c MFr Bld 11/22/2019 6.3  4.6 - 6.5 % Final   Glycemic Control Guidelines for People with Diabetes:Non Diabetic:  <6%Goal of Therapy: <7%Additional Action Suggested:  >8%   . Microalb, Ur 11/22/2019 6.2* 0.0 - 1.9 mg/dL Final  . Creatinine,U 11/22/2019 153.4  mg/dL Final  .  Microalb Creat Ratio 11/22/2019 4.0  0.0 - 30.0 mg/g Final     Allergies as of 11/26/2019      Reactions   Pneumococcal Vaccine Anaphylaxis   weakness   Pneumovax 23 [pneumococcal Vac Polyvalent] Anaphylaxis   weakness   Other Hives   Other reaction(s): Other (See Comments) Pneumonia  Vaccine- very ill   Sulfa Antibiotics Hives      Medication List       Accurate as of November 26, 2019 10:25 AM. If you have any questions, ask your nurse or doctor.        acetaminophen 325 MG tablet Commonly known as: TYLENOL Take by mouth every 6 (six) hours as needed for mild pain or moderate pain.   acyclovir 400 MG tablet Commonly known as: ZOVIRAX Take 400 mg by mouth 2 (two) times daily.   ALPRAZolam 0.25 MG tablet Commonly known as: XANAX Take 0.25 mg by mouth at bedtime as needed for anxiety.   amLODipine 5 MG tablet Commonly known as: NORVASC Take 1 tablet (5 mg total) by mouth daily.   atorvastatin 40 MG tablet Commonly known as: LIPITOR TAKE 1 TABLET(40 MG) BY MOUTH DAILY AT 6 PM   BD Disp Needles 30G X 1/2" Misc Generic drug: NEEDLE (DISP) 30 G 1 each by Does not apply route 2 (two) times daily before a meal.   CALCIUM PO Take 1 tablet by mouth daily. 600 mg   Cholecalciferol 50 MCG (2000 UT) Tabs Take 4,000 Units by mouth daily.   Colcrys 0.6 MG tablet Generic drug: colchicine Take 0.6 mg by mouth daily as needed (gout).   Eliquis 5 MG Tabs tablet Generic drug: apixaban TAKE 1 TABLET(5 MG) BY MOUTH TWICE DAILY   gabapentin 100 MG capsule Commonly known as: NEURONTIN Take 100-200 mg by mouth at bedtime.   glucose blood test strip Commonly known as: FREESTYLE LITE Use to test blood sugar twice daily. Dx: E11.9   hydrochlorothiazide 25 MG tablet Commonly known as: HYDRODIURIL TAKE 1 TABLET(25 MG) BY MOUTH DAILY   hydrocortisone cream 1 % Apply 1 application topically daily as needed for itching.   insulin regular 100 units/mL injection Commonly known  as: NovoLIN R ReliOn Inject 0.06-0.1 mLs (6-10 Units total) into the skin 3 (three) times daily before meals. What changed:   how much to take  when to take this  additional instructions   INSULIN SYRINGE 1CC/31GX5/16" 31G X 5/16" 1 ML Misc USE THREE TIMES DAILY TO INJECT INSULIN   levothyroxine 137 MCG tablet  Commonly known as: SYNTHROID Take 137 mcg by mouth daily before breakfast.   magnesium 30 MG tablet Take 30 mg by mouth at bedtime.   metFORMIN 500 MG tablet Commonly known as: GLUCOPHAGE TAKE 1 TABLET BY MOUTH EVERY MORNING AND 2 TABLETS EVERY EVENING   metoprolol succinate 25 MG 24 hr tablet Commonly known as: TOPROL-XL Take 1 tablet (25 mg total) by mouth 2 (two) times daily.   NovoLIN N FlexPen 100 UNIT/ML Kiwkpen Generic drug: Insulin NPH (Human) (Isophane) Inject 25 Units into the skin 2 (two) times daily. Inject 25 units under the skin twice daily.   OneTouch Delica Lancets 99991111 Misc Use to check blood sugars once daily   Ozempic (0.25 or 0.5 MG/DOSE) 2 MG/1.5ML Sopn Generic drug: Semaglutide(0.25 or 0.5MG /DOS) TITRATE 0.25MG  ONCE WEEKLY FOR 4 WEEKS THEN 0.5MG  ONCE WEEKLY ONCE A WEEK   pioglitazone 15 MG tablet Commonly known as: ACTOS TAKE 1 TABLET(15 MG) BY MOUTH DAILY   prednisoLONE acetate 1 % ophthalmic suspension Commonly known as: PRED FORTE Place 2 drops into the right eye 4 (four) times daily.   timolol 0.5 % ophthalmic solution Commonly known as: TIMOPTIC Place 1 drop into both eyes daily.   vitamin B-12 1000 MCG tablet Commonly known as: CYANOCOBALAMIN Take 1,000 mcg by mouth daily.   vitamin C 500 MG tablet Commonly known as: ASCORBIC ACID Take 500 mg by mouth daily.       Allergies:  Allergies  Allergen Reactions  . Pneumococcal Vaccine Anaphylaxis    weakness  . Pneumovax 23 [Pneumococcal Vac Polyvalent] Anaphylaxis    weakness  . Other Hives    Other reaction(s): Other (See Comments) Pneumonia  Vaccine- very ill  .  Sulfa Antibiotics Hives    Past Medical History:  Diagnosis Date  . Anemia    s/p Heme work -up normal EGD and colonscopy in 2012 per pt,neg SPEP  . Anxiety   . Aortic stenosis    mild by echo 07/2019  . Bilateral carotid artery stenosis 06/08/2015   1-39% bilateral  . Bradycardia   . CKD (chronic kidney disease), stage III   . Diabetes mellitus without complication ([place])    type 2  . GERD (gastroesophageal reflux disease)   . Gout   . HSV-1 (herpes simplex virus 1) infection    Acyclovir prn  . Hyperkalemia   . Hyperlipidemia   . Hypertension   . Joint pain    osteoarthritis by Xray- possible erosion of R 4th MCP,elevated uric  acid ,ANA +   . Morbid obesity ([place])   . OSA on CPAP   . Osteopenia   . PAF (paroxysmal atrial fibrillation) ([place])   . Pain management    Neurosurg: Dr Clydell Hakim  . Pulmonary HTN ([place])    Moderate by echo  2016. PASP 41mmHg  . TIA (transient ischemic attack)    remote history of TIA's early 2000    Past Surgical History:  Procedure Laterality Date  . ABDOMINAL HYSTERECTOMY    . APPENDECTOMY    . BACK SURGERY     cervical fusion  . BLADDER SURGERY    . CESAREAN SECTION    . CHOLECYSTECTOMY      Family History  Problem Relation Age of Onset  . Arrhythmia Brother     Social History:  reports that she has never smoked. She has never used smokeless tobacco. She reports that she does not drink alcohol or use drugs.  Review of Systems:   Hypertension:  This has been managed by her PCP or nephrologist She is taking amlodipine 5 mg, metoprolol 25 mg and HCTZ 25 mg  BP Readings from Last 3 Encounters:  11/26/19 138/70  01/29/19 (!) 150/95  01/23/19 130/71   Lab Results  Component Value Date   CREATININE 1.38 (H) 11/22/2019   CREATININE 1.18 08/16/2019   CREATININE 1.25 (H) 03/13/2019     Lipids: Triglycerides have been high to a variable extent LDL has been below 100 with Lipitor 40 mg prescribed by her cardiologist  Lab  Results  Component Value Date   CHOL 140 03/13/2019   HDL 29.30 (L) 03/13/2019   LDLCALC 50 01/10/2018   LDLDIRECT 80.0 03/13/2019   TRIG 239.0 (H) 03/13/2019   CHOLHDL 5 03/13/2019    Renal insufficiency: Her creatinine is fluctuating somewhat  Has seen nephrologist  Lisinopril caused hyperkalemia previously  Lab Results  Component Value Date   CREATININE 1.38 (H) 11/22/2019   CREATININE 1.18 08/16/2019   CREATININE 1.25 (H) 03/13/2019     She has hypothyroidism and is taking 137 mcg daily from her PCP  Last TSH as follows  She is regular with taking her levothyroxine before breakfast  Lab Results  Component Value Date   TSH 3.32 08/16/2019   TSH 1.84 12/13/2018   TSH 5.34 (H) 09/11/2018   FREET4 1.15 12/13/2018   FREET4 1.52 01/10/2018   FREET4 1.24 07/03/2015     She has been on gabapentin 100 mg hs for mild burning and tingling, she says she prefers to take it regularly to prevent any pain and help her sleep    Examination:   BP 138/70 (BP Location: Left Arm, Patient Position: Sitting, Cuff Size: Large)   Pulse 97   Ht 5' 1.5" (1.562 m)   Wt 231 lb (104.8 kg)   SpO2 98%   BMI 42.94 kg/m   Body mass index is 42.94 kg/m.   No ankle edema present  ASSESSMENT/ PLAN:   Diabetes type 2 with obesity, insulin requiring:    See history of present illness for detailed discussion of  current management, blood sugar patterns and issues identified  Her A1c is much improved at 6.3  She is benefiting from starting [place] in 2020 She thinks she has lost weight, previously had been getting significant weight gain with insulin alone She does better with her diet with portion control and she subjectively feels better also Not clear what her blood sugar patterns are as she is checking infrequently and mostly in the mornings only She is using an old meter and not clear if this is still accurate, lab fasting glucose was 167 but she reports readings around 120   However considering her age and comorbid conditions her level of control is excellent judged by her A1c We will leave her on the same medications   RENAL dysfunction: This is variable and not related to diabetes, microalbumin normal  Hypertension: Blood pressure is well controlled  HYPOTHYROIDISM: Well-controlled    There are no Patient Instructions on file for this visit.   Elayne Snare 11/26/2019, 10:25 AM

## 2019-11-26 NOTE — Patient Instructions (Signed)
Check blood sugars on waking up 3 days a week  Also check blood sugars about 2 hours after meals and do this after different meals by rotation  Recommended blood sugar levels on waking up are 90-130 and about 2 hours after meal is 130-160  Please bring your blood sugar monitor to each visit, thank you   

## 2019-12-10 ENCOUNTER — Other Ambulatory Visit: Payer: Self-pay

## 2019-12-10 MED ORDER — PIOGLITAZONE HCL 15 MG PO TABS: ORAL_TABLET | ORAL | 1 refills | Status: DC

## 2019-12-11 ENCOUNTER — Other Ambulatory Visit: Payer: Self-pay | Admitting: *Deleted

## 2019-12-11 ENCOUNTER — Encounter: Payer: Self-pay | Admitting: *Deleted

## 2019-12-11 NOTE — Patient Outreach (Addendum)
[facility]) Care Management Chronic Special Needs Program  12/11/2019  Name: CAYCI WENGLER DOB: 09-08-1940  MRN: [id number]  Ms. Mialyn Difrancesco is enrolled in a chronic special needs plan for Diabetes. Chronic Care Management Coordinator telephoned client to review health risk assessment and to develop individualized care plan.  Introduced the chronic care management program, importance of client participation, and taking their care plan to all provider appointments and inpatient facilities.  Reviewed the transition of care process and possible referral to community care management.  Subjective: Client states she lives with her daughter, son in law and grandchildren and has any assistance she may need.  Client reports she "is not worried about heart failure, have no problems with this", does not have scale, does not weigh and this is not a goal client wants to work towards.  Client states she has HTA calendar and knows the HF action plan is in the back as well as diabetes and atrial fibrillation action plan.  Client reports she manages her own medications and has no issues with this, does not feel she needs pharmacy referral for reconciliation.  Client states she goes to chiropractor now and this "has helped my back problem", feels this is keeping pain more adequately controlled.  Client will look over advanced directives packet and call RN care manager if any questions.  Client states she has Freestyle glucometer "that is old" and wants a new one.  Client states she is trying to be more active and stay busy, move around and walk some. Client states she has been constipated for approximately one month and intends to speak with her doctor about this.  Client verbalizes she will call HTA conciegre to inquire about 40$ copayments for chiropractor to see if any other options, to ask about which glucometers available under HTA plan. Client reports she checks CBG once daily and fasting  ranges are 90-120's.  Goals Addressed            This Visit's Progress   . COMPLETED:  Acknowledge receipt of Advanced Directive package       Client received Advanced Directives packet Contact your assigned RN care manager at [phone] if you need assistance    . Client understands the importance of follow-up with providers by attending scheduled visits   On track    Client's last visit to primary care provider 07/02/19, endocrinologist 11/26/19 Continue to attend all scheduled follow up appointments    . Client verbalize knowledge of Heart Failure disease self management skills by 6 months       Please look at heart failure action plan in the back of [employer] calendar Weigh daily and record weights Adhere to low salt diet Please call your doctor if you are in the yellow zone    . Client will report no worsening of symptoms of Atrial Fibrillation within the next 6 months   On track    Please review A-fib Action Plan in the back of [employer] calendar. Please report to your health care provider any worsening of symptoms such as racing or irregular heart beat, dizziness, or weakness. Call 911 for emergency such as chest pain, fainting, struggling to breathe. Please follow up with your health care provider as recommended.     . Client will report no worsening of symptoms related to heart disease within the next 6 months   On track    Please follow a heart healthy diet including lean protein, fruits and vegetables  including fiber.  This will help with constipation also. Please take all medications as prescribed and follow up with your doctor    . Client will verbalize knowledge of self management of Hypertension as evidences by BP reading of 140/90 or less; or as defined by provider       take blood pressure medications as prescribed. Plan to check blood pressure regularly and take results to your health care provider appointments. Plan to follow a low salt  diet. Increase activity as tolerated. Follow up with your health care provider as recommended. Please ask your health care provider " what is my target blood pressure range". Blood pressure 138/70 on 11/26/19     . Client/Caregiver will verbalize understanding of instructions related to self-care and safety   On track    Please use your walker as needed Ask for assistance as needed Keep pathways clear     . HEMOGLOBIN A1C < 7.0       Discussed diabetes self management actions:  Glucose monitoring per provider recommendation  Visit provider every 3-6 months as directed  Hbg A1C level every 3-6 months.  Carbohydrate controlled meal planning  Taking diabetes medication as prescribed by provider  Physical activity  AIC 6.3 on 11/22/19    . Maintain timely refills of diabetic medication as prescribed within the year .   On track    Take medications as prescribed. Follow up with your health care provider if you have any questions. Please contact your assigned RN care manager if you have difficulty obtaining medications. Review of electronic medical record medication dispense report indicates client maintains timely refills of diabetic medications.     . Obtain annual  Lipid Profile, LDL-C   On track    Lipid profile completed 03/13/19  LDL=80 The goal for LDL is less than 70mg /dl as you are at high risk for complications. Try to avoid saturated fats, trans-fats and eat more fiber. Continue to follow up with your health care provider for any needed lab work.     [place] Annual Eye (retinal)  Exam        Client has scheduled appointment for eye exam April 2021 Please complete your eye exam yearly    . Obtain Annual Foot Exam   On track    Client saw podiatrist 10/10/19 Diabetes foot care- check feet daily at home (check for skin color changes, cuts, sores on the skin, swelling of feet or ankles, ingrown or fungal toenails, corns or callouses) Report these findings to your  doctor Wash feet with soap and water, dry feet well, especially between toes. Moisturize your feet but not between toes. Always wear shoes that protect your whole feet.    . Obtain annual screen for micro albuminuria (urine) , nephropathy (kidney problems)       Annual microalbumin completed 11/22/19 Continue to obtain yearly physical and lab checks as recommended by provider.    . Obtain Hemoglobin A1C at least 2 times per year   On track    Hemoglobin AIC completed on 11/22/19 Continue to keep follow up appointments with provider and have labwork completed as recommended.    . Patient Stated " Be more active" (pt-stated)       Before beginning any physical activity/ exercise program, please speak with your doctor Client is trying to walk more- continue to keep up the good work on this     . Visit Primary Care Provider or Endocrinologist at least 2 times per year  On track    Last primary care provider visit 07/02/19, last endocrinology visit 11/26/19 Call and schedule yearly physical and follow up visits as recommended by your providers.       PLAN:  RN Transport planner provided client HTA conciegre contact number for any benefit questions. RN care manager sent letter to Dr. Dwyane Dee reporting client wants new glucometer and requests what [employer] will cover.  RN care manager faxed letter and individualized care plan with update to primary care provider that client has constipation x one month and would like any recommendations for this. RN care manager mailed successful outreach letter with individualized care plan, consent form and magnet (client cannot find hers) to client's home.  Chronic care management coordination will outreach in:  6 months  Jacqlyn Larsen [facility] [place] Coordinator [place] [facility]/RN [place] Case Manager, C-SNP  [phone]

## 2019-12-12 ENCOUNTER — Other Ambulatory Visit: Payer: Self-pay

## 2019-12-12 MED ORDER — FREESTYLE LITE TEST VI STRP: ORAL_STRIP | 1 refills | Status: DC

## 2019-12-12 MED ORDER — FREESTYLE FREEDOM LITE W/DEVICE KIT: 1.0000 | PACK | Freq: Two times a day (BID) | 1 refills | Status: DC

## 2019-12-18 DIAGNOSIS — K5900 Constipation, unspecified: Secondary | ICD-10-CM | POA: Diagnosis not present

## 2019-12-18 DIAGNOSIS — M545 Low back pain: Secondary | ICD-10-CM | POA: Diagnosis not present

## 2019-12-21 DIAGNOSIS — H40002 Preglaucoma, unspecified, left eye: Secondary | ICD-10-CM | POA: Diagnosis not present

## 2019-12-21 DIAGNOSIS — B0052 Herpesviral keratitis: Secondary | ICD-10-CM | POA: Diagnosis not present

## 2019-12-21 DIAGNOSIS — H25812 Combined forms of age-related cataract, left eye: Secondary | ICD-10-CM | POA: Diagnosis not present

## 2019-12-21 DIAGNOSIS — H02883 Meibomian gland dysfunction of right eye, unspecified eyelid: Secondary | ICD-10-CM | POA: Diagnosis not present

## 2019-12-31 ENCOUNTER — Other Ambulatory Visit: Payer: Self-pay

## 2019-12-31 ENCOUNTER — Other Ambulatory Visit: Payer: Self-pay | Admitting: Cardiology

## 2019-12-31 DIAGNOSIS — I272 Pulmonary hypertension, unspecified: Secondary | ICD-10-CM

## 2019-12-31 DIAGNOSIS — I480 Paroxysmal atrial fibrillation: Secondary | ICD-10-CM

## 2019-12-31 DIAGNOSIS — I10 Essential (primary) hypertension: Secondary | ICD-10-CM

## 2019-12-31 DIAGNOSIS — I6523 Occlusion and stenosis of bilateral carotid arteries: Secondary | ICD-10-CM

## 2019-12-31 DIAGNOSIS — Z9989 Dependence on other enabling machines and devices: Secondary | ICD-10-CM

## 2019-12-31 DIAGNOSIS — G4733 Obstructive sleep apnea (adult) (pediatric): Secondary | ICD-10-CM

## 2019-12-31 DIAGNOSIS — I350 Nonrheumatic aortic (valve) stenosis: Secondary | ICD-10-CM

## 2019-12-31 MED ORDER — ATORVASTATIN CALCIUM 40 MG PO TABS: ORAL_TABLET | ORAL | 0 refills | Status: DC

## 2020-01-09 ENCOUNTER — Ambulatory Visit: Payer: HMO | Admitting: Podiatry

## 2020-01-24 DIAGNOSIS — M545 Low back pain: Secondary | ICD-10-CM | POA: Diagnosis not present

## 2020-01-24 DIAGNOSIS — Z0000 Encounter for general adult medical examination without abnormal findings: Secondary | ICD-10-CM | POA: Diagnosis not present

## 2020-01-24 DIAGNOSIS — I4891 Unspecified atrial fibrillation: Secondary | ICD-10-CM | POA: Diagnosis not present

## 2020-01-24 DIAGNOSIS — D6859 Other primary thrombophilia: Secondary | ICD-10-CM | POA: Diagnosis not present

## 2020-01-24 DIAGNOSIS — Z79899 Other long term (current) drug therapy: Secondary | ICD-10-CM | POA: Diagnosis not present

## 2020-01-24 DIAGNOSIS — I5042 Chronic combined systolic (congestive) and diastolic (congestive) heart failure: Secondary | ICD-10-CM | POA: Diagnosis not present

## 2020-01-24 DIAGNOSIS — I10 Essential (primary) hypertension: Secondary | ICD-10-CM | POA: Diagnosis not present

## 2020-01-24 DIAGNOSIS — E1121 Type 2 diabetes mellitus with diabetic nephropathy: Secondary | ICD-10-CM | POA: Diagnosis not present

## 2020-01-24 DIAGNOSIS — E039 Hypothyroidism, unspecified: Secondary | ICD-10-CM | POA: Diagnosis not present

## 2020-01-24 DIAGNOSIS — G4700 Insomnia, unspecified: Secondary | ICD-10-CM | POA: Diagnosis not present

## 2020-01-24 DIAGNOSIS — E559 Vitamin D deficiency, unspecified: Secondary | ICD-10-CM | POA: Diagnosis not present

## 2020-01-24 DIAGNOSIS — I350 Nonrheumatic aortic (valve) stenosis: Secondary | ICD-10-CM | POA: Diagnosis not present

## 2020-01-25 DIAGNOSIS — H40002 Preglaucoma, unspecified, left eye: Secondary | ICD-10-CM | POA: Diagnosis not present

## 2020-01-25 DIAGNOSIS — E083292 Diabetes mellitus due to underlying condition with mild nonproliferative diabetic retinopathy without macular edema, left eye: Secondary | ICD-10-CM | POA: Diagnosis not present

## 2020-01-25 DIAGNOSIS — Z947 Corneal transplant status: Secondary | ICD-10-CM | POA: Diagnosis not present

## 2020-01-25 DIAGNOSIS — B0052 Herpesviral keratitis: Secondary | ICD-10-CM | POA: Diagnosis not present

## 2020-02-04 ENCOUNTER — Other Ambulatory Visit: Payer: Self-pay | Admitting: Cardiology

## 2020-02-05 ENCOUNTER — Other Ambulatory Visit: Payer: Self-pay | Admitting: Cardiology

## 2020-02-06 ENCOUNTER — Other Ambulatory Visit: Payer: Self-pay | Admitting: Cardiology

## 2020-02-06 DIAGNOSIS — I350 Nonrheumatic aortic (valve) stenosis: Secondary | ICD-10-CM

## 2020-02-06 DIAGNOSIS — I10 Essential (primary) hypertension: Secondary | ICD-10-CM

## 2020-02-06 DIAGNOSIS — I480 Paroxysmal atrial fibrillation: Secondary | ICD-10-CM

## 2020-02-06 DIAGNOSIS — I272 Pulmonary hypertension, unspecified: Secondary | ICD-10-CM

## 2020-02-06 DIAGNOSIS — I6523 Occlusion and stenosis of bilateral carotid arteries: Secondary | ICD-10-CM

## 2020-02-06 DIAGNOSIS — G4733 Obstructive sleep apnea (adult) (pediatric): Secondary | ICD-10-CM

## 2020-02-25 ENCOUNTER — Other Ambulatory Visit: Payer: Self-pay | Admitting: Cardiology

## 2020-02-25 ENCOUNTER — Other Ambulatory Visit (INDEPENDENT_AMBULATORY_CARE_PROVIDER_SITE_OTHER): Payer: HMO

## 2020-02-25 ENCOUNTER — Other Ambulatory Visit: Payer: Self-pay

## 2020-02-25 DIAGNOSIS — E1165 Type 2 diabetes mellitus with hyperglycemia: Secondary | ICD-10-CM | POA: Diagnosis not present

## 2020-02-25 DIAGNOSIS — I6523 Occlusion and stenosis of bilateral carotid arteries: Secondary | ICD-10-CM

## 2020-02-25 DIAGNOSIS — Z794 Long term (current) use of insulin: Secondary | ICD-10-CM

## 2020-02-25 DIAGNOSIS — I480 Paroxysmal atrial fibrillation: Secondary | ICD-10-CM

## 2020-02-25 DIAGNOSIS — I272 Pulmonary hypertension, unspecified: Secondary | ICD-10-CM

## 2020-02-25 DIAGNOSIS — I10 Essential (primary) hypertension: Secondary | ICD-10-CM

## 2020-02-25 DIAGNOSIS — I350 Nonrheumatic aortic (valve) stenosis: Secondary | ICD-10-CM

## 2020-02-25 DIAGNOSIS — E782 Mixed hyperlipidemia: Secondary | ICD-10-CM

## 2020-02-25 DIAGNOSIS — G4733 Obstructive sleep apnea (adult) (pediatric): Secondary | ICD-10-CM

## 2020-02-25 LAB — COMPREHENSIVE METABOLIC PANEL
ALT: 13 U/L (ref 0–35)
AST: 15 U/L (ref 0–37)
Albumin: 4 g/dL (ref 3.5–5.2)
Alkaline Phosphatase: 71 U/L (ref 39–117)
BUN: 28 mg/dL — ABNORMAL HIGH (ref 6–23)
CO2: 31 mEq/L (ref 19–32)
Calcium: 10 mg/dL (ref 8.4–10.5)
Chloride: 100 mEq/L (ref 96–112)
Creatinine, Ser: 1.14 mg/dL (ref 0.40–1.20)
GFR: 45.96 mL/min — ABNORMAL LOW (ref >60.00)
Glucose, Bld: 131 mg/dL — ABNORMAL HIGH (ref 70–99)
Potassium: 4 mEq/L (ref 3.5–5.1)
Sodium: 138 mEq/L (ref 135–145)
Total Bilirubin: 0.5 mg/dL (ref 0.2–1.2)
Total Protein: 7.2 g/dL (ref 6.0–8.3)

## 2020-02-25 LAB — LIPID PANEL
Cholesterol: 133 mg/dL (ref 0–200)
HDL: 28.5 mg/dL — ABNORMAL LOW (ref >39.00)
NonHDL: 104.78
Total CHOL/HDL Ratio: 5
Triglycerides: 218 mg/dL — ABNORMAL HIGH (ref 0.0–149.0)
VLDL: 43.6 mg/dL — ABNORMAL HIGH (ref 0.0–40.0)

## 2020-02-25 LAB — HEMOGLOBIN A1C: Hgb A1c MFr Bld: 6.8 % — ABNORMAL HIGH (ref 4.6–6.5)

## 2020-02-25 LAB — LDL CHOLESTEROL, DIRECT: Direct LDL: 72 mg/dL

## 2020-02-28 ENCOUNTER — Other Ambulatory Visit: Payer: Self-pay

## 2020-02-28 ENCOUNTER — Encounter: Payer: Self-pay | Admitting: Endocrinology

## 2020-02-28 ENCOUNTER — Ambulatory Visit (INDEPENDENT_AMBULATORY_CARE_PROVIDER_SITE_OTHER): Payer: HMO | Admitting: Endocrinology

## 2020-02-28 VITALS — BP 124/68 | HR 61 | Ht 61.5 in | Wt 235.6 lb

## 2020-02-28 DIAGNOSIS — E782 Mixed hyperlipidemia: Secondary | ICD-10-CM | POA: Diagnosis not present

## 2020-02-28 DIAGNOSIS — E1165 Type 2 diabetes mellitus with hyperglycemia: Secondary | ICD-10-CM

## 2020-02-28 DIAGNOSIS — E063 Autoimmune thyroiditis: Secondary | ICD-10-CM | POA: Diagnosis not present

## 2020-02-28 DIAGNOSIS — Z794 Long term (current) use of insulin: Secondary | ICD-10-CM

## 2020-02-28 NOTE — Patient Instructions (Signed)
Use new test strips  Check blood sugars on waking up 3 days a week  Also check blood sugars about 2 hours after meals and do this after different meals by rotation  Recommended blood sugar levels on waking up are 90-130 and about 2 hours after meal is 130-160  Please bring your blood sugar monitor to each visit, thank you

## 2020-02-28 NOTE — Progress Notes (Signed)
Patient ID: Tina Hester, female   DOB: Jan 25, 1941, 79 y.o.   MRN: [id number]    Reason for Appointment: follow-up   History of Present Illness   Diagnosis: Type 2 DIABETES MELITUS, long-standing    Previous history:  She has been on insulin for a few years. Because of cost she has been switched from Lantus to NPH insulin twice a day She had previously been taking metformin but this was stopped when she had renal insufficiency Overall her blood sugars have been difficult to control and she has previously had relatively poor control. However with adding Actos her sugars had started improving significantly Also with her being very consistent with diet in 2015 her A1c had gone down to 6.4 To improve her control she was given a trial of the V-go pump in 2/17 but she had allergic skin reaction with the adhesive  Recent history:    Insulin regimen: Humulin N 15 units twice daily;  10 prn  Oral hypoglycemic drugs: Actos 15 mg, Metformin 500 mg in a.m.-1000 mg, Ozempic 0.5 mg weekly  Her A1c is back up to 6.8, previously was at 6.3  Current blood sugar patterns, management and problems identified:   Her meter shows only 4 blood sugars in the last month  Her test strips expired 2 years ago  She has on her own reduced her NPH from 18 down to 15 units and not clear why  Highest blood sugar was 189 after dinner, this was a couple of months ago  She still thinks that Ozempic helps her keep her portions down and she is usually skipping breakfast  Previously had lost weight because of acute illness and her weight has come back up 5 pounds  She is mostly doing some walking when she is shopping but no formal exercise  No hypoglycemic symptoms reported                 Side effects from medications:She did not tolerate Invokana and was having muscle cramps  Monitors blood glucose: occasionally     Glucometer:  FreeStyle        Blood Glucose readings in the last few weeks  range from 112-166, mostly fasting and only a few readings available        Wt Readings from Last 3 Encounters:  02/28/20 235 lb 9.6 oz (106.9 kg)  11/26/19 231 lb (104.8 kg)  01/23/19 230 lb (104.3 kg)   LABS:  Lab Results  Component Value Date   HGBA1C 6.8 (H) 02/25/2020   HGBA1C 6.3 11/22/2019   HGBA1C 6.8 (H) 08/16/2019   Lab Results  Component Value Date   MICROALBUR 6.2 (H) 11/22/2019   LDLCALC 50 01/10/2018   CREATININE 1.14 02/25/2020   Lab on 02/25/2020  Component Date Value Ref Range Status  . Cholesterol 02/25/2020 133  0 - 200 mg/dL Final   ATP III Classification       Desirable:  < 200 mg/dL               Borderline High:  200 - 239 mg/dL          High:  > = 240 mg/dL  . Triglycerides 02/25/2020 218.0* 0 - 149 mg/dL Final   Normal:  <150 mg/dLBorderline High:  150 - 199 mg/dL  . HDL 02/25/2020 28.50* >39.00 mg/dL Final  . VLDL 02/25/2020 43.6* 0.0 - 40.0 mg/dL Final  . Total CHOL/HDL Ratio 02/25/2020 5   Final  Men          Women1/2 Average Risk     3.4          3.3Average Risk          5.0          4.42X Average Risk          9.6          7.13X Average Risk          15.0          11.0                      . NonHDL 02/25/2020 104.78   Final   NOTE:  Non-HDL goal should be 30 mg/dL higher than patient's LDL goal (i.e. LDL goal of < 70 mg/dL, would have non-HDL goal of < 100 mg/dL)  . Sodium 02/25/2020 138  135 - 145 mEq/L Final  . Potassium 02/25/2020 4.0  3.5 - 5.1 mEq/L Final  . Chloride 02/25/2020 100  96 - 112 mEq/L Final  . CO2 02/25/2020 31  19 - 32 mEq/L Final  . Glucose, Bld 02/25/2020 131* 70 - 99 mg/dL Final  . BUN 02/25/2020 28* 6 - 23 mg/dL Final  . Creatinine, Ser 02/25/2020 1.14  0.40 - 1.20 mg/dL Final  . Total Bilirubin 02/25/2020 0.5  0.2 - 1.2 mg/dL Final  . Alkaline Phosphatase 02/25/2020 71  39 - 117 U/L Final  . AST 02/25/2020 15  0 - 37 U/L Final  . ALT 02/25/2020 13  0 - 35 U/L Final  . Total Protein 02/25/2020 7.2  6.0 -  8.3 g/dL Final  . Albumin 02/25/2020 4.0  3.5 - 5.2 g/dL Final  . GFR 02/25/2020 45.96* >60.00 mL/min Final  . Calcium 02/25/2020 10.0  8.4 - 10.5 mg/dL Final  . Hgb A1c MFr Bld 02/25/2020 6.8* 4.6 - 6.5 % Final   Glycemic Control Guidelines for People with Diabetes:Non Diabetic:  <6%Goal of Therapy: <7%Additional Action Suggested:  >8%   . Direct LDL 02/25/2020 72.0  mg/dL Final   Optimal:  <100 mg/dLNear or Above Optimal:  100-129 mg/dLBorderline High:  130-159 mg/dLHigh:  160-189 mg/dLVery High:  >190 mg/dL     Allergies as of 02/28/2020      Reactions   Pneumococcal Vaccine Anaphylaxis   weakness   Pneumovax 23 [pneumococcal Vac Polyvalent] Anaphylaxis   weakness   Other Hives   Other reaction(s): Other (See Comments) Pneumonia  Vaccine- very ill   Sulfa Antibiotics Hives      Medication List       Accurate as of February 28, 2020  2:04 PM. If you have any questions, ask your nurse or doctor.        acetaminophen 325 MG tablet Commonly known as: TYLENOL Take by mouth every 6 (six) hours as needed for mild pain or moderate pain.   acyclovir 400 MG tablet Commonly known as: ZOVIRAX Take 400 mg by mouth 2 (two) times daily.   ALPRAZolam 0.25 MG tablet Commonly known as: XANAX Take 0.25 mg by mouth at bedtime as needed for anxiety.   amLODipine 5 MG tablet Commonly known as: NORVASC Take 1 tablet (5 mg total) by mouth daily.   atorvastatin 40 MG tablet Commonly known as: LIPITOR TAKE 1 TABLET BY MOUTH EVERY DAY AT 6PM - Pt needs to make appt with provider for further refills, 1st attempt   BD Disp Needles 30G X 1/2" Misc Generic drug:  NEEDLE (DISP) 30 G 1 each by Does not apply route 2 (two) times daily before a meal.   CALCIUM PO Take 1 tablet by mouth daily. 600 mg   Cholecalciferol 50 MCG (2000 UT) Tabs Take 4,000 Units by mouth daily.   Colcrys 0.6 MG tablet Generic drug: colchicine Take 0.6 mg by mouth daily as needed (gout).   Eliquis 5 MG Tabs  tablet Generic drug: apixaban TAKE 1 TABLET(5 MG) BY MOUTH TWICE DAILY   FreeStyle Freedom Lite w/Device Kit 1 each by Does not apply route in the morning and at bedtime. Use Freestyle lite meter to check blood sugar twice daily.   FREESTYLE LITE test strip Generic drug: glucose blood Use to test blood sugar twice daily. Dx: E11.9   gabapentin 100 MG capsule Commonly known as: NEURONTIN Take 100-200 mg by mouth at bedtime.   hydrochlorothiazide 25 MG tablet Commonly known as: HYDRODIURIL TAKE 1 TABLET(25 MG) BY MOUTH DAILY. Call and schedule follow up office visit to continue refills. Thank you. [phone]   hydrocortisone cream 1 % Apply 1 application topically daily as needed for itching.   insulin regular 100 units/mL injection Commonly known as: NovoLIN R ReliOn Inject 0.06-0.1 mLs (6-10 Units total) into the skin 3 (three) times daily before meals.   INSULIN SYRINGE 1CC/31GX5/16" 31G X 5/16" 1 ML Misc USE THREE TIMES DAILY TO INJECT INSULIN   levothyroxine 137 MCG tablet Commonly known as: SYNTHROID Take 137 mcg by mouth daily before breakfast.   magnesium 30 MG tablet Take 30 mg by mouth at bedtime.   metFORMIN 500 MG tablet Commonly known as: GLUCOPHAGE TAKE 1 TABLET BY MOUTH EVERY MORNING AND 2 TABLETS EVERY EVENING   metoprolol succinate 25 MG 24 hr tablet Commonly known as: TOPROL-XL Take 1 tablet (25 mg total) by mouth daily. Pt needs to make appt with provider before anymore refills - 1st attempt   NovoLIN N FlexPen 100 UNIT/ML Kiwkpen Generic drug: Insulin NPH (Human) (Isophane) Inject 25 Units into the skin 2 (two) times daily. Inject 25 units under the skin twice daily. What changed: how much to take   OneTouch Delica Lancets 84X Misc Use to check blood sugars once daily   Ozempic (0.25 or 0.5 MG/DOSE) 2 MG/1.5ML Sopn Generic drug: Semaglutide(0.25 or 0.5MG/DOS) TITRATE 0.25MG ONCE WEEKLY FOR 4 WEEKS THEN 0.5MG ONCE WEEKLY ONCE A WEEK    pioglitazone 15 MG tablet Commonly known as: ACTOS TAKE 1 TABLET(15 MG) BY MOUTH DAILY   prednisoLONE acetate 1 % ophthalmic suspension Commonly known as: PRED FORTE Place 2 drops into the right eye 4 (four) times daily.   timolol 0.5 % ophthalmic solution Commonly known as: TIMOPTIC Place 1 drop into both eyes daily.   vitamin B-12 1000 MCG tablet Commonly known as: CYANOCOBALAMIN Take 1,000 mcg by mouth daily.   vitamin C 500 MG tablet Commonly known as: ASCORBIC ACID Take 500 mg by mouth daily.       Allergies:  Allergies  Allergen Reactions  . Pneumococcal Vaccine Anaphylaxis    weakness  . Pneumovax 23 [Pneumococcal Vac Polyvalent] Anaphylaxis    weakness  . Other Hives    Other reaction(s): Other (See Comments) Pneumonia  Vaccine- very ill  . Sulfa Antibiotics Hives    Past Medical History:  Diagnosis Date  . Anemia    s/p Heme work -up normal EGD and colonscopy in 2012 per pt,neg SPEP  . Anxiety   . Aortic stenosis    mild by echo 07/2019  .  Bilateral carotid artery stenosis 06/08/2015   1-39% bilateral  . Bradycardia   . CKD (chronic kidney disease), stage III   . Diabetes mellitus without complication ([place])    type 2  . GERD (gastroesophageal reflux disease)   . Gout   . HSV-1 (herpes simplex virus 1) infection    Acyclovir prn  . Hyperkalemia   . Hyperlipidemia   . Hypertension   . Joint pain    osteoarthritis by Xray- possible erosion of R 4th MCP,elevated uric  acid ,ANA +   . Morbid obesity ([place])   . OSA on CPAP   . Osteopenia   . PAF (paroxysmal atrial fibrillation) ([place])   . Pain management    Neurosurg: Dr Clydell Hakim  . Pulmonary HTN ([place])    Moderate by echo  2016. PASP 37mHg  . TIA (transient ischemic attack)    remote history of TIA's early 2000    Past Surgical History:  Procedure Laterality Date  . ABDOMINAL HYSTERECTOMY    . APPENDECTOMY    . BACK SURGERY     cervical fusion  . BLADDER SURGERY    . CESAREAN SECTION     . CHOLECYSTECTOMY      Family History  Problem Relation Age of Onset  . Arrhythmia Brother     Social History:  reports that she has never smoked. She has never used smokeless tobacco. She reports that she does not drink alcohol and does not use drugs.  Review of Systems:   Hypertension:   This has been managed by her PCP or nephrologist She is taking amlodipine 5 mg, metoprolol 25 mg and HCTZ 25 mg  BP Readings from Last 3 Encounters:  02/28/20 124/68  11/26/19 138/70  01/29/19 (!) 150/95   Lab Results  Component Value Date   CREATININE 1.14 02/25/2020   CREATININE 1.38 (H) 11/22/2019   CREATININE 1.18 08/16/2019     Lipids: Triglycerides have been high to a variable extent LDL has been below 100 with Lipitor 40 mg prescribed by her cardiologist  Lab Results  Component Value Date   CHOL 133 02/25/2020   HDL 28.50 (L) 02/25/2020   LDLCALC 50 01/10/2018   LDLDIRECT 72.0 02/25/2020   TRIG 218.0 (H) 02/25/2020   CHOLHDL 5 02/25/2020    Renal insufficiency: Her creatinine is back to normal  Has seen nephrologist  Lisinopril caused hyperkalemia previously  Lab Results  Component Value Date   CREATININE 1.14 02/25/2020   CREATININE 1.38 (H) 11/22/2019   CREATININE 1.18 08/16/2019     She has hypothyroidism and is taking 137 mcg daily from her PCP  Last TSH as follows  She is regular with taking her levothyroxine before breakfast  Lab Results  Component Value Date   TSH 3.32 08/16/2019   TSH 1.84 12/13/2018   TSH 5.34 (H) 09/11/2018   FREET4 1.15 12/13/2018   FREET4 1.52 01/10/2018   FREET4 1.24 07/03/2015     She has been on gabapentin 100 mg hs for mild burning and tingling, she says she prefers to take it regularly to prevent any pain and help her sleep    Examination:   BP 124/68 (BP Location: Left Arm, Patient Position: Sitting)   Pulse 61   Ht 5' 1.5" (1.562 m)   Wt 235 lb 9.6 oz (106.9 kg)   SpO2 96%   BMI 43.80 kg/m   Body mass  index is 43.8 kg/m.      ASSESSMENT/ PLAN:   Diabetes type 2  with obesity, insulin requiring:    See history of present illness for detailed discussion of  current management, blood sugar patterns and issues identified  Her A1c is slightly higher at 6.8  Taking only NPH and Ozempic in 2020 and only rarely takes regular insulin when eating out in the evening  Blood sugar monitoring has been minimal and her test strips at home are expired 2 years ago Encourage her to use new test strips and check readings after meals alternating with fasting Discussed blood sugar targets Discussed that if she has consistently high readings after dinner will need to take regular insulin more often No change in insulin as yet  Lab blood sugars are not high   LIPIDS: Triglycerides are relatively high with taking atorvastatin alone, this is prescribed by her cardiologist We will consider adding fenofibrate if triglycerides are persistently high  HYPOTHYROIDISM: Needs follow-up on the next visit    Patient Instructions  Use new test strips  Check blood sugars on waking up 3 days a week  Also check blood sugars about 2 hours after meals and do this after different meals by rotation  Recommended blood sugar levels on waking up are 90-130 and about 2 hours after meal is 130-160  Please bring your blood sugar monitor to each visit, thank you      Elayne Snare 02/28/2020, 2:04 PM

## 2020-03-11 ENCOUNTER — Ambulatory Visit: Payer: HMO | Admitting: Podiatry

## 2020-03-11 ENCOUNTER — Encounter: Payer: Self-pay | Admitting: Podiatry

## 2020-03-11 ENCOUNTER — Other Ambulatory Visit: Payer: Self-pay

## 2020-03-11 DIAGNOSIS — M79674 Pain in right toe(s): Secondary | ICD-10-CM

## 2020-03-11 DIAGNOSIS — E119 Type 2 diabetes mellitus without complications: Secondary | ICD-10-CM | POA: Diagnosis not present

## 2020-03-11 DIAGNOSIS — L84 Corns and callosities: Secondary | ICD-10-CM | POA: Diagnosis not present

## 2020-03-11 DIAGNOSIS — B351 Tinea unguium: Secondary | ICD-10-CM

## 2020-03-11 DIAGNOSIS — Z794 Long term (current) use of insulin: Secondary | ICD-10-CM | POA: Diagnosis not present

## 2020-03-11 DIAGNOSIS — M79675 Pain in left toe(s): Secondary | ICD-10-CM

## 2020-03-15 NOTE — Progress Notes (Signed)
Subjective: Tina Hester presents today for follow up of preventative diabetic foot care and callus(es) plantarly b/l and painful mycotic toenails b/l that are difficult to trim. Pain interferes with ambulation. Aggravating factors include wearing enclosed shoe gear. Pain is relieved with periodic professional debridement.   She voices no new pedal concerns on today's visit.  Allergies  Allergen Reactions  . Pneumococcal Vaccine Anaphylaxis    weakness  . Pneumovax 23 [Pneumococcal Vac Polyvalent] Anaphylaxis    weakness  . Other Hives    Other reaction(s): Other (See Comments) Pneumonia  Vaccine- very ill  . Sulfa Antibiotics Hives     Objective: There were no vitals filed for this visit.  Vascular Examination:  Capillary refill time to digits immediate b/l, palpable DP pulses b/l, palpable PT pulses b/l, pedal hair absent b/l and skin temperature gradient within normal limits b/l  Dermatological Examination: Pedal skin with normal turgor, texture and tone bilaterally, no open wounds bilaterally, no interdigital macerations bilaterally, toenails 1-5 b/l elongated, dystrophic, thickened, crumbly with subungual debris and hyperkeratotic lesion(s) submet head 1 b/l.  No erythema, no edema, no drainage, no flocculence .  Incurvated nailplate left great toe medial border(s) with tenderness to palpation. No erythema, no edema, no drainage noted.  Musculoskeletal: Normal muscle strength 5/5 to all lower extremity muscle groups bilaterally, no gross bony deformities bilaterally, no pain crepitus or joint limitation noted with ROM b/l, hammertoes noted to the  b/l 5th digit and dorsal exostosis dorsal aspect of right midfoot. No erythema, no edema, no drainage.  Neurological: Protective sensation intact 5/5 intact bilaterally with 10g monofilament b/l and vibratory sensation intact b/l  Assessment: Pain due to onychomycosis of toenails of both feet Calluses submet head 1  b/l Ingrown toenail medial border left hallux, noninfected Type 2 diabetes mellitus without complication, with long-term current use of insulin ([place])  Plan: -Continue diabetic foot care principles.  -Toenails 1-5 b/l were debrided in length and girth without iatrogenic bleeding.  Incurvated nailplate left great toe medial border(s) with tenderness to palpation. No erythema, no edema, no drainage noted. -Calluses were debrided without complication or incident. Total number debrided =2: submet head 1 b/l  -Patient to continue soft, supportive shoe gear daily. -Patient to report any pedal injuries to medical professional immediately. -Patient/POA to call should there be question/concern in the interim.  Return in about 3 months (around 06/11/2020) for diabetic nail and callus trim.

## 2020-03-16 ENCOUNTER — Other Ambulatory Visit: Payer: Self-pay | Admitting: Cardiology

## 2020-03-24 ENCOUNTER — Other Ambulatory Visit: Payer: Self-pay | Admitting: Cardiology

## 2020-03-27 ENCOUNTER — Telehealth: Payer: Self-pay | Admitting: Cardiology

## 2020-03-27 MED ORDER — METOPROLOL SUCCINATE ER 25 MG PO TB24: 25.0000 mg | ORAL_TABLET | Freq: Two times a day (BID) | ORAL | 0 refills | Status: DC

## 2020-03-27 NOTE — Telephone Encounter (Signed)
Prescription has been updated to correct dosage. Metoprolol succinate 25 mg twice daily.

## 2020-03-27 NOTE — Telephone Encounter (Signed)
New message   *STAT* If patient is at the pharmacy, call can be transferred to refill team.   1. Which medications need to be refilled? (please list name of each medication and dose if known) metoprolol succinate (TOPROL-XL) 25 MG 24 hr tablet 2 tablets a day 2. Which pharmacy/location (including street and city if local pharmacy) is medication to be sent to? [id number] - [place], [place] - [address]  3. Do they need a 30 day or 90 day supply? 90 day

## 2020-03-27 NOTE — Telephone Encounter (Signed)
Follow Up  Patient's prescription was changed from twice a day to once a day. Please assist with correcting prescription. Patient is out of medication.

## 2020-03-31 ENCOUNTER — Other Ambulatory Visit: Payer: Self-pay | Admitting: Endocrinology

## 2020-04-14 ENCOUNTER — Other Ambulatory Visit: Payer: Self-pay | Admitting: Cardiology

## 2020-04-14 DIAGNOSIS — I10 Essential (primary) hypertension: Secondary | ICD-10-CM

## 2020-04-14 DIAGNOSIS — I480 Paroxysmal atrial fibrillation: Secondary | ICD-10-CM

## 2020-04-14 DIAGNOSIS — I350 Nonrheumatic aortic (valve) stenosis: Secondary | ICD-10-CM

## 2020-04-14 DIAGNOSIS — I6523 Occlusion and stenosis of bilateral carotid arteries: Secondary | ICD-10-CM

## 2020-04-14 DIAGNOSIS — G4733 Obstructive sleep apnea (adult) (pediatric): Secondary | ICD-10-CM

## 2020-04-14 DIAGNOSIS — I272 Pulmonary hypertension, unspecified: Secondary | ICD-10-CM

## 2020-04-16 ENCOUNTER — Other Ambulatory Visit: Payer: Self-pay | Admitting: Cardiology

## 2020-04-16 DIAGNOSIS — I350 Nonrheumatic aortic (valve) stenosis: Secondary | ICD-10-CM

## 2020-04-16 DIAGNOSIS — I480 Paroxysmal atrial fibrillation: Secondary | ICD-10-CM

## 2020-04-16 DIAGNOSIS — I6523 Occlusion and stenosis of bilateral carotid arteries: Secondary | ICD-10-CM

## 2020-04-16 DIAGNOSIS — I272 Pulmonary hypertension, unspecified: Secondary | ICD-10-CM

## 2020-04-16 DIAGNOSIS — G4733 Obstructive sleep apnea (adult) (pediatric): Secondary | ICD-10-CM

## 2020-04-16 DIAGNOSIS — I10 Essential (primary) hypertension: Secondary | ICD-10-CM

## 2020-04-28 DIAGNOSIS — H4051X4 Glaucoma secondary to other eye disorders, right eye, indeterminate stage: Secondary | ICD-10-CM | POA: Diagnosis not present

## 2020-04-28 DIAGNOSIS — H40002 Preglaucoma, unspecified, left eye: Secondary | ICD-10-CM | POA: Diagnosis not present

## 2020-04-30 ENCOUNTER — Other Ambulatory Visit: Payer: Self-pay

## 2020-04-30 MED ORDER — METOPROLOL SUCCINATE ER 25 MG PO TB24: 25.0000 mg | ORAL_TABLET | Freq: Two times a day (BID) | ORAL | 0 refills | Status: DC

## 2020-04-30 NOTE — Telephone Encounter (Signed)
Pt's medication was sent to pt's pharmacy as requested. Confirmation received.  °

## 2020-05-01 ENCOUNTER — Other Ambulatory Visit: Payer: Self-pay | Admitting: Cardiology

## 2020-05-01 NOTE — Progress Notes (Signed)
Date:  05/02/2020   ID:  Tina Hester, DOB 1941/03/16, MRN [id number]   PCP:  Jonathon Jordan, MD  Cardiologist:  Fransico Him, MD  Electrophysiologist:  Constance Haw, MD   Chief Complaint:  Atrial fibrillation, OSA, HTN  History of Present Illness:    Tina Hester is a 79 y.o. female with a hx of PAF, HTN, bilateral carotid artery stenosis, aortic stenosis, morbid obesity and OSA on CPAP.   She is here today for followup and is doing well.  She denies any chest pain or pressure, PND, orthopnea, LE edema, dizziness, palpitations or syncope. She has chronic DOE which is stable.  She is compliant with her meds and is tolerating meds with no SE.    She is doing well with her CPAP device and thinks that she has gotten used to it.  SHe tolerates the mask and feels the pressure is adequate.  Since going on CPAP she feels rested in the am and has no significant daytime sleepiness.  She denies any significant mouth or nasal dryness or nasal congestion.  She does not think that he snores.    Prior CV studies:   The following studies were reviewed today:  2D echo, carotid dopplers, labs,   Past Medical History:  Diagnosis Date  . Anemia    s/p Heme work -up normal EGD and colonscopy in 2012 per pt,neg SPEP  . Anxiety   . Aortic stenosis    mild by echo 07/2019  . Bilateral carotid artery stenosis 06/08/2015   1-39% bilateral  . Bradycardia   . CKD (chronic kidney disease), stage III   . Diabetes mellitus without complication ([place])    type 2  . GERD (gastroesophageal reflux disease)   . Gout   . HSV-1 (herpes simplex virus 1) infection    Acyclovir prn  . Hyperkalemia   . Hyperlipidemia   . Hypertension   . Joint pain    osteoarthritis by Xray- possible erosion of R 4th MCP,elevated uric  acid ,ANA +   . Morbid obesity ([place])   . OSA on CPAP   . Osteopenia   . PAF (paroxysmal atrial fibrillation) ([place])   . Pain management    Neurosurg: Dr Clydell Hakim  .  Pulmonary HTN ([place])    Moderate by echo  2016. PASP 50mHg  . TIA (transient ischemic attack)    remote history of TIA's early 2000   Past Surgical History:  Procedure Laterality Date  . ABDOMINAL HYSTERECTOMY    . APPENDECTOMY    . BACK SURGERY     cervical fusion  . BLADDER SURGERY    . CESAREAN SECTION    . CHOLECYSTECTOMY       Current Meds  Medication Sig  . acetaminophen (TYLENOL) 325 MG tablet Take by mouth every 6 (six) hours as needed for mild pain or moderate pain.  .Marland Kitchenacyclovir (ZOVIRAX) 400 MG tablet Take 400 mg by mouth 2 (two) times daily.   .Marland KitchenALPRAZolam (XANAX) 0.25 MG tablet Take 0.25 mg by mouth at bedtime as needed for anxiety.  .Marland KitchenamLODipine (NORVASC) 5 MG tablet Take 1 tablet (5 mg total) by mouth daily.  .Marland Kitchenatorvastatin (LIPITOR) 40 MG tablet TAKE 1 TABLET BY MOUTH EVERY DAY AT 6 PM. Please keep upcoming appt in September with Dr. TRadford Paxbefore anymore refills. Thank you  . Blood Glucose Monitoring Suppl (FREESTYLE FREEDOM LITE) w/Device KIT 1 each by Does not apply route in the morning and at  bedtime. Use Freestyle lite meter to check blood sugar twice daily.  Marland Kitchen CALCIUM PO Take 1 tablet by mouth daily. 600 mg  . Cholecalciferol 2000 units TABS Take 4,000 Units by mouth daily.  Marland Kitchen COLCRYS 0.6 MG tablet Take 0.6 mg by mouth daily as needed (gout).   Marland Kitchen ELIQUIS 5 MG TABS tablet TAKE 1 TABLET(5 MG) BY MOUTH TWICE DAILY  . gabapentin (NEURONTIN) 100 MG capsule Take 100-200 mg by mouth at bedtime.   Marland Kitchen glucose blood (FREESTYLE LITE) test strip Use to test blood sugar twice daily. Dx: E11.9  . hydrochlorothiazide (HYDRODIURIL) 25 MG tablet TAKE 1 TABLET(25 MG) BY MOUTH DAILY. FOLLOW UP OFFICE VISIT TO. CONTINUE REFILLS. THANK YOU. [id number]  . hydrocortisone cream 1 % Apply 1 application topically daily as needed for itching.  . Insulin NPH Human, Isophane, (NOVOLIN N FLEXPEN Titanic) Inject 30 Units into the skin daily.  . Insulin Syringe-Needle U-100 (INSULIN SYRINGE  1CC/31GX5/16") 31G X 5/16" 1 ML MISC USE THREE TIMES DAILY TO INJECT INSULIN  . levothyroxine (SYNTHROID) 137 MCG tablet Take 137 mcg by mouth daily before breakfast.  . magnesium 30 MG tablet Take 30 mg by mouth at bedtime.   . metFORMIN (GLUCOPHAGE) 500 MG tablet TAKE 1 TABLET BY MOUTH EVERY MORNING AND 2 TABLETS EVERY EVENING  . metoprolol succinate (TOPROL-XL) 25 MG 24 hr tablet Take 25 mg by mouth in the morning and at bedtime.  Marland Kitchen NEEDLE, DISP, 30 G (BD DISP NEEDLES) 30G X 1/2" MISC 1 each by Does not apply route 2 (two) times daily before a meal.  . NOVOLIN N FLEXPEN RELION 100 UNIT/ML Kiwkpen INJECT 25 UNITS INTO THE SKIN TWICE DAILY  . ONETOUCH DELICA LANCETS 16X MISC Use to check blood sugars once daily  . OZEMPIC, 0.25 OR 0.5 MG/DOSE, 2 MG/1.5ML SOPN TITRATE 0.25MG ONCE WEEKLY FOR 4 WEEKS THEN 0.5MG ONCE WEEKLY ONCE A WEEK  . pioglitazone (ACTOS) 15 MG tablet TAKE 1 TABLET(15 MG) BY MOUTH DAILY  . prednisoLONE acetate (PRED FORTE) 1 % ophthalmic suspension Place 2 drops into the right eye daily.   . timolol (TIMOPTIC) 0.5 % ophthalmic solution Place 1 drop into both eyes daily.   . vitamin B-12 (CYANOCOBALAMIN) 1000 MCG tablet Take 1,000 mcg by mouth daily.  . vitamin C (ASCORBIC ACID) 500 MG tablet Take 500 mg by mouth daily.     Allergies:   Pneumococcal vaccine, Pneumovax 23 [pneumococcal vac polyvalent], Other, and Sulfa antibiotics   Social History   Tobacco Use  . Smoking status: Never Smoker  . Smokeless tobacco: Never Used  Substance Use Topics  . Alcohol use: No  . Drug use: No     Family Hx: The patient's family history includes Arrhythmia in her brother.  ROS:   Please see the history of present illness.     All other systems reviewed and are negative.   Labs/Other Tests and Data Reviewed:    Recent Labs: 08/16/2019: TSH 3.32 02/25/2020: ALT 13; BUN 28; Creatinine, Ser 1.14; Potassium 4.0; Sodium 138   Recent Lipid Panel Lab Results  Component Value  Date/Time   CHOL 133 02/25/2020 11:14 AM   TRIG 218.0 (H) 02/25/2020 11:14 AM   HDL 28.50 (L) 02/25/2020 11:14 AM   CHOLHDL 5 02/25/2020 11:14 AM   LDLCALC 50 01/10/2018 08:31 AM   LDLDIRECT 72.0 02/25/2020 11:14 AM    Wt Readings from Last 3 Encounters:  05/02/20 238 lb (108 kg)  02/28/20 235 lb 9.6 oz (106.9 kg)  11/26/19 231 lb (104.8 kg)     Objective:    Vital Signs:  BP 120/76   Pulse 99   Ht 5' 1.5" (1.562 m)   Wt 238 lb (108 kg)   SpO2 96%   BMI 44.24 kg/m    GEN: Well nourished, well developed in no acute distress HEENT: Normal NECK: No JVD; No carotid bruits LYMPHATICS: No lymphadenopathy CARDIAC:irregularly irregular, no murmurs, rubs, gallops RESPIRATORY:  Clear to auscultation without rales, wheezing or rhonchi  ABDOMEN: Soft, non-tender, non-distended MUSCULOSKELETAL:  No edema; No deformity  SKIN: Warm and dry NEUROLOGIC:  Alert and oriented x 3 PSYCHIATRIC:  Normal affect  EKG was performed int he office today and demonstrates atrial flutter with CVR at 99bpm with   ASSESSMENT & PLAN:    1.  Persistent atrial fibrillation/atrial flutter -she has failed Multaq.   -She was evaluated by Dr. Lennie Odor and was doing better and wanted to hold off on afib ablation -denies any bleeding problems on DOAC -She will continue on Eliquis 30m BID (age < 840and creatinine < 1.5).   -Hbg 13.2 in May 2021 -She is in atrial flutter with variable block today but her HR is higher than I would like to see so I will increase Toprol XL to 770mdaily (she will take 5011mn am and 66m45m night)   2.  Hypertension  -BP controlled on exam -continue HCTZ 66mg45mly and amlodipine 5mg d86my.  -increasing Toprol XL to 75mg d60m -SCr stable at 1.14 in June 2021  3.  Bilateral carotid artery stenosis  -her last carotid Dopplers in 2018 showed mild carotid stenosis 1 to 39% bur repeat dopplers 01/2019 essentially normal -continue statin  4.  Mild aortic stenosis  -her last 2D  echo was in 2018 showing a mean aortic valve gradient 15 mmHg.   5.  Hyperlipidemia  -her LDL goal is less than 70 due to carotid stenosis.  -Her last LDL was 72 on 02/25/2020 -continue Atorvastatin 40mg da47m 6.  Morbid obesity  -I have encouraged her to get into a routine exercise program and cut back on carbs and portions.   7.  OSA -she uses her CPAP nightly with no problems. -she feels rested in the am -I will get a download on her device -her machine is old so I will order her a new device and order a Resmed CPAP on auto from 4-18cm H2O with heated humidity and mask of choice -she will need to see me 8 weeks after getting a new device   Medication Adjustments/Labs and Tests Ordered: Current medicines are reviewed at length with the patient today.  Concerns regarding medicines are outlined above.  Tests Ordered: Orders Placed This Encounter  Procedures  . EKG 12-Lead   Medication Changes: No orders of the defined types were placed in this encounter.   Disposition:  Follow up in 6 month(s)  Signed, Ondrea Dow TuFransico Him3/2021 10:52 AM    [facility]

## 2020-05-02 ENCOUNTER — Other Ambulatory Visit: Payer: Self-pay

## 2020-05-02 ENCOUNTER — Encounter: Payer: Self-pay | Admitting: Cardiology

## 2020-05-02 ENCOUNTER — Telehealth: Payer: Self-pay | Admitting: *Deleted

## 2020-05-02 ENCOUNTER — Ambulatory Visit (INDEPENDENT_AMBULATORY_CARE_PROVIDER_SITE_OTHER): Payer: HMO | Admitting: Cardiology

## 2020-05-02 VITALS — BP 120/76 | HR 99 | Ht 61.5 in | Wt 238.0 lb

## 2020-05-02 DIAGNOSIS — I10 Essential (primary) hypertension: Secondary | ICD-10-CM

## 2020-05-02 DIAGNOSIS — I4819 Other persistent atrial fibrillation: Secondary | ICD-10-CM

## 2020-05-02 DIAGNOSIS — Z9989 Dependence on other enabling machines and devices: Secondary | ICD-10-CM

## 2020-05-02 DIAGNOSIS — E7800 Pure hypercholesterolemia, unspecified: Secondary | ICD-10-CM | POA: Diagnosis not present

## 2020-05-02 DIAGNOSIS — I6523 Occlusion and stenosis of bilateral carotid arteries: Secondary | ICD-10-CM | POA: Diagnosis not present

## 2020-05-02 DIAGNOSIS — I350 Nonrheumatic aortic (valve) stenosis: Secondary | ICD-10-CM

## 2020-05-02 MED ORDER — METOPROLOL SUCCINATE ER 50 MG PO TB24: ORAL_TABLET | ORAL | 3 refills | Status: DC

## 2020-05-02 NOTE — Telephone Encounter (Signed)
Please order a new Resmed CPAP on auto from 4-18cm H2O with heated humidity and mask of choice,. Will need to see me 8 weeks after she gets new device

## 2020-05-02 NOTE — Patient Instructions (Signed)
Please take your blood pressure daily for one week and send [place] a message with your readings.   Medication Instructions:  Your physician has recommended you make the following change in your medication:  1) INCREASE Toprol XL to 50 mg every morning and 25 mg every evening  *If you need a refill on your cardiac medications before your next appointment, please call your pharmacy*   Follow-Up: At [facility], you and your health needs are our priority.  As part of our continuing mission to provide you with exceptional heart care, we have created designated Provider Care Teams.  These Care Teams include your primary Cardiologist (physician) and Advanced Practice Providers (APPs -  Physician Assistants and Nurse Practitioners) who all work together to provide you with the care you need, when you need it.  We recommend signing up for the patient portal called "MyChart".  Sign up information is provided on this After Visit Summary.  MyChart is used to connect with patients for Virtual Visits (Telemedicine).  Patients are able to view lab/test results, encounter notes, upcoming appointments, etc.  Non-urgent messages can be sent to your provider as well.   To learn more about what you can do with MyChart, go to [email or web address].    Gae Bon will be in contact with you for follow up after changes to CPAP.

## 2020-05-02 NOTE — Telephone Encounter (Signed)
Order placed to [facility] via community message to [place]

## 2020-05-02 NOTE — Addendum Note (Signed)
Addended by: Antonieta Iba on: 05/02/2020 11:12 AM   Modules accepted: Orders

## 2020-05-02 NOTE — Telephone Encounter (Signed)
Pt's pharmacy is requesting a refill on metoprolol 25 mg tablets. Dose pt suppose to be taking both metoprolol 50 and 25? Please address

## 2020-05-02 NOTE — Telephone Encounter (Signed)
Tina Hester at [facility] states in June of 2020 patient said she had not used cpap in 20 years and said cancel the order she did not want it.

## 2020-05-06 ENCOUNTER — Other Ambulatory Visit: Payer: Self-pay | Admitting: Cardiology

## 2020-05-22 ENCOUNTER — Other Ambulatory Visit: Payer: Self-pay | Admitting: Cardiology

## 2020-05-26 ENCOUNTER — Other Ambulatory Visit: Payer: Self-pay | Admitting: Cardiology

## 2020-05-26 DIAGNOSIS — I6523 Occlusion and stenosis of bilateral carotid arteries: Secondary | ICD-10-CM

## 2020-05-26 DIAGNOSIS — G4733 Obstructive sleep apnea (adult) (pediatric): Secondary | ICD-10-CM

## 2020-05-26 DIAGNOSIS — I350 Nonrheumatic aortic (valve) stenosis: Secondary | ICD-10-CM

## 2020-05-26 DIAGNOSIS — I272 Pulmonary hypertension, unspecified: Secondary | ICD-10-CM

## 2020-05-26 DIAGNOSIS — I480 Paroxysmal atrial fibrillation: Secondary | ICD-10-CM

## 2020-05-26 DIAGNOSIS — I10 Essential (primary) hypertension: Secondary | ICD-10-CM

## 2020-05-27 ENCOUNTER — Other Ambulatory Visit: Payer: Self-pay

## 2020-05-27 ENCOUNTER — Other Ambulatory Visit (INDEPENDENT_AMBULATORY_CARE_PROVIDER_SITE_OTHER): Payer: HMO

## 2020-05-27 DIAGNOSIS — E1165 Type 2 diabetes mellitus with hyperglycemia: Secondary | ICD-10-CM

## 2020-05-27 DIAGNOSIS — E063 Autoimmune thyroiditis: Secondary | ICD-10-CM | POA: Diagnosis not present

## 2020-05-27 DIAGNOSIS — Z794 Long term (current) use of insulin: Secondary | ICD-10-CM

## 2020-05-27 DIAGNOSIS — E782 Mixed hyperlipidemia: Secondary | ICD-10-CM

## 2020-05-27 DIAGNOSIS — N390 Urinary tract infection, site not specified: Secondary | ICD-10-CM | POA: Diagnosis not present

## 2020-05-27 DIAGNOSIS — Z23 Encounter for immunization: Secondary | ICD-10-CM | POA: Diagnosis not present

## 2020-05-27 LAB — LDL CHOLESTEROL, DIRECT: Direct LDL: 85 mg/dL

## 2020-05-27 LAB — COMPREHENSIVE METABOLIC PANEL
ALT: 14 U/L (ref 0–35)
AST: 18 U/L (ref 0–37)
Albumin: 3.7 g/dL (ref 3.5–5.2)
Alkaline Phosphatase: 61 U/L (ref 39–117)
BUN: 25 mg/dL — ABNORMAL HIGH (ref 6–23)
CO2: 29 mEq/L (ref 19–32)
Calcium: 9.4 mg/dL (ref 8.4–10.5)
Chloride: 101 mEq/L (ref 96–112)
Creatinine, Ser: 1.31 mg/dL — ABNORMAL HIGH (ref 0.40–1.20)
GFR: 39.13 mL/min — ABNORMAL LOW (ref >60.00)
Glucose, Bld: 168 mg/dL — ABNORMAL HIGH (ref 70–99)
Potassium: 4.4 mEq/L (ref 3.5–5.1)
Sodium: 137 mEq/L (ref 135–145)
Total Bilirubin: 0.5 mg/dL (ref 0.2–1.2)
Total Protein: 7 g/dL (ref 6.0–8.3)

## 2020-05-27 LAB — LIPID PANEL
Cholesterol: 150 mg/dL (ref 0–200)
HDL: 29.7 mg/dL — ABNORMAL LOW (ref >39.00)
NonHDL: 120.69
Total CHOL/HDL Ratio: 5
Triglycerides: 203 mg/dL — ABNORMAL HIGH (ref 0.0–149.0)
VLDL: 40.6 mg/dL — ABNORMAL HIGH (ref 0.0–40.0)

## 2020-05-27 LAB — TSH: TSH: 9.98 u[IU]/mL — ABNORMAL HIGH (ref 0.35–4.50)

## 2020-05-27 LAB — T4, FREE: Free T4: 1.01 ng/dL (ref 0.60–1.60)

## 2020-05-27 LAB — HEMOGLOBIN A1C: Hgb A1c MFr Bld: 7.2 % — ABNORMAL HIGH (ref 4.6–6.5)

## 2020-05-30 ENCOUNTER — Ambulatory Visit: Payer: HMO | Admitting: Endocrinology

## 2020-06-02 ENCOUNTER — Other Ambulatory Visit: Payer: Self-pay | Admitting: *Deleted

## 2020-06-02 ENCOUNTER — Encounter: Payer: Self-pay | Admitting: Endocrinology

## 2020-06-02 ENCOUNTER — Ambulatory Visit (INDEPENDENT_AMBULATORY_CARE_PROVIDER_SITE_OTHER): Payer: HMO | Admitting: Endocrinology

## 2020-06-02 ENCOUNTER — Other Ambulatory Visit: Payer: Self-pay

## 2020-06-02 VITALS — BP 124/76 | HR 76 | Ht 61.5 in | Wt 234.0 lb

## 2020-06-02 DIAGNOSIS — E063 Autoimmune thyroiditis: Secondary | ICD-10-CM | POA: Diagnosis not present

## 2020-06-02 DIAGNOSIS — E782 Mixed hyperlipidemia: Secondary | ICD-10-CM | POA: Diagnosis not present

## 2020-06-02 DIAGNOSIS — E1165 Type 2 diabetes mellitus with hyperglycemia: Secondary | ICD-10-CM

## 2020-06-02 DIAGNOSIS — Z794 Long term (current) use of insulin: Secondary | ICD-10-CM | POA: Diagnosis not present

## 2020-06-02 MED ORDER — OZEMPIC (1 MG/DOSE) 2 MG/1.5ML ~~LOC~~ SOPN: 1.0000 mg | PEN_INJECTOR | SUBCUTANEOUS | 2 refills | Status: DC

## 2020-06-02 MED ORDER — LEVOTHYROXINE SODIUM 150 MCG PO TABS: 150.0000 ug | ORAL_TABLET | Freq: Every day | ORAL | 3 refills | Status: DC

## 2020-06-02 MED ORDER — OZEMPIC (0.25 OR 0.5 MG/DOSE) 2 MG/1.5ML ~~LOC~~ SOPN: 0.5000 mg | PEN_INJECTOR | SUBCUTANEOUS | 0 refills | Status: DC

## 2020-06-02 NOTE — Progress Notes (Signed)
Patient ID: Tina Hester, female   DOB: 1941/07/20, 79 y.o.   MRN: [id number]    Reason for Appointment: follow-up   History of Present Illness   Diagnosis: Type 2 DIABETES MELITUS, long-standing    Previous history:  She has been on insulin for a few years. Because of cost she has been switched from Lantus to NPH insulin twice a day She had previously been taking metformin but this was stopped when she had renal insufficiency Overall her blood sugars have been difficult to control and she has previously had relatively poor control. However with adding Actos her sugars had started improving significantly Also with her being very consistent with diet in 2015 her A1c had gone down to 6.4 To improve her control she was given a trial of the V-go pump in 2/17 but she had allergic skin reaction with the adhesive  Recent history:    Insulin regimen: Humulin N 15 units twice daily;  10 units regular insulin prn  Oral hypoglycemic drugs: Actos 15 mg, Metformin 500 mg in a.m.-1000 mg, Ozempic 0.5 mg weekly  Her A1c is further increased at 7.2, has been as low as 6.3   Current blood sugar patterns, management and problems identified:   Her meter shows only 6 blood sugars in the last month   She has not been taking Ozempic every week because of cost, she thinks it has always been costing her about $200 a month  Her blood sugar monitoring again is very inadequate  She has only a couple of readings in the evening before dinner and they appear to be high  Except for this morning her morning sugars are still fairly good with her 15 units at bedtime  Sometimes her meals are high fat, yesterday she had a hot dog for both lunch and breakfast  As before does not take any mealtime insulin  She is doing some walking when she is out shopping, not able to do any other exercise  No hypoglycemic symptoms at any time                 Side effects from medications:She did not  tolerate Invokana and was having muscle cramps  Monitors blood glucose: occasionally     Glucometer:  FreeStyle        Blood Glucose readings:  FASTING range 107-170  Dinnertime 178, 186 AVERAGE 153        Wt Readings from Last 3 Encounters:  06/02/20 234 lb (106.1 kg)  05/02/20 238 lb (108 kg)  02/28/20 235 lb 9.6 oz (106.9 kg)   LABS:  Lab Results  Component Value Date   HGBA1C 7.2 (H) 05/27/2020   HGBA1C 6.8 (H) 02/25/2020   HGBA1C 6.3 11/22/2019   Lab Results  Component Value Date   MICROALBUR 6.2 (H) 11/22/2019   LDLCALC 50 01/10/2018   CREATININE 1.31 (H) 05/27/2020   Lab on 05/27/2020  Component Date Value Ref Range Status  . TSH 05/27/2020 9.98* 0.35 - 4.50 uIU/mL Final  . Free T4 05/27/2020 1.01  0.60 - 1.60 ng/dL Final   Comment: Specimens from patients who are undergoing biotin therapy and /or ingesting biotin supplements may contain high levels of biotin.  The higher biotin concentration in these specimens interferes with this Free T4 assay.  Specimens that contain high levels  of biotin may cause false high results for this Free T4 assay.  Please interpret results in light of the total clinical presentation of the patient.    Marland Kitchen  Cholesterol 05/27/2020 150  0 - 200 mg/dL Final   ATP III Classification       Desirable:  < 200 mg/dL               Borderline High:  200 - 239 mg/dL          High:  > = 240 mg/dL  . Triglycerides 05/27/2020 203.0* 0 - 149 mg/dL Final   Normal:  <150 mg/dLBorderline High:  150 - 199 mg/dL  . HDL 05/27/2020 29.70* >39.00 mg/dL Final  . VLDL 05/27/2020 40.6* 0.0 - 40.0 mg/dL Final  . Total CHOL/HDL Ratio 05/27/2020 5   Final                  Men          Women1/2 Average Risk     3.4          3.3Average Risk          5.0          4.42X Average Risk          9.6          7.13X Average Risk          15.0          11.0                      . NonHDL 05/27/2020 120.69   Final   NOTE:  Non-HDL goal should be 30 mg/dL higher than patient's  LDL goal (i.e. LDL goal of < 70 mg/dL, would have non-HDL goal of < 100 mg/dL)  . Sodium 05/27/2020 137  135 - 145 mEq/L Final  . Potassium 05/27/2020 4.4  3.5 - 5.1 mEq/L Final  . Chloride 05/27/2020 101  96 - 112 mEq/L Final  . CO2 05/27/2020 29  19 - 32 mEq/L Final  . Glucose, Bld 05/27/2020 168* 70 - 99 mg/dL Final  . BUN 05/27/2020 25* 6 - 23 mg/dL Final  . Creatinine, Ser 05/27/2020 1.31* 0.40 - 1.20 mg/dL Final  . Total Bilirubin 05/27/2020 0.5  0.2 - 1.2 mg/dL Final  . Alkaline Phosphatase 05/27/2020 61  39 - 117 U/L Final  . AST 05/27/2020 18  0 - 37 U/L Final  . ALT 05/27/2020 14  0 - 35 U/L Final  . Total Protein 05/27/2020 7.0  6.0 - 8.3 g/dL Final  . Albumin 05/27/2020 3.7  3.5 - 5.2 g/dL Final  . GFR 05/27/2020 39.13* >60.00 mL/min Final  . Calcium 05/27/2020 9.4  8.4 - 10.5 mg/dL Final  . Hgb A1c MFr Bld 05/27/2020 7.2* 4.6 - 6.5 % Final   Glycemic Control Guidelines for People with Diabetes:Non Diabetic:  <6%Goal of Therapy: <7%Additional Action Suggested:  >8%   . Direct LDL 05/27/2020 85.0  mg/dL Final   Optimal:  <100 mg/dLNear or Above Optimal:  100-129 mg/dLBorderline High:  130-159 mg/dLHigh:  160-189 mg/dLVery High:  >190 mg/dL     Allergies as of 06/02/2020      Reactions   Pneumococcal Vaccine Anaphylaxis   weakness   Pneumovax 23 [pneumococcal Vac Polyvalent] Anaphylaxis   weakness   Other Hives   Other reaction(s): Other (See Comments) Pneumonia  Vaccine- very ill   Sulfa Antibiotics Hives      Medication List       Accurate as of June 02, 2020 10:54 AM. If you have any questions, ask your nurse or doctor.  acetaminophen 325 MG tablet Commonly known as: TYLENOL Take by mouth every 6 (six) hours as needed for mild pain or moderate pain.   acyclovir 400 MG tablet Commonly known as: ZOVIRAX Take 400 mg by mouth 2 (two) times daily.   ALPRAZolam 0.25 MG tablet Commonly known as: XANAX Take 0.25 mg by mouth at bedtime as needed for  anxiety.   amLODipine 5 MG tablet Commonly known as: NORVASC TAKE 1 TABLET(5 MG) BY MOUTH DAILY   atorvastatin 40 MG tablet Commonly known as: LIPITOR TAKE 1 TABLET BY MOUTH DAILY AT 6PM   BD Disp Needles 30G X 1/2" Misc Generic drug: NEEDLE (DISP) 30 G 1 each by Does not apply route 2 (two) times daily before a meal.   CALCIUM PO Take 1 tablet by mouth daily. 600 mg   Cholecalciferol 50 MCG (2000 UT) Tabs Take 4,000 Units by mouth daily.   Colcrys 0.6 MG tablet Generic drug: colchicine Take 0.6 mg by mouth daily as needed (gout).   Eliquis 5 MG Tabs tablet Generic drug: apixaban TAKE 1 TABLET(5 MG) BY MOUTH TWICE DAILY   FreeStyle Freedom Lite w/Device Kit 1 each by Does not apply route in the morning and at bedtime. Use Freestyle lite meter to check blood sugar twice daily.   FREESTYLE LITE test strip Generic drug: glucose blood Use to test blood sugar twice daily. Dx: E11.9   gabapentin 100 MG capsule Commonly known as: NEURONTIN Take 100-200 mg by mouth at bedtime.   hydrochlorothiazide 25 MG tablet Commonly known as: HYDRODIURIL Take 1 tablet (25 mg total) by mouth daily. TAKE 1 TABLET(25 MG) BY MOUTH DAILY.   hydrocortisone cream 1 % Apply 1 application topically daily as needed for itching.   INSULIN SYRINGE 1CC/31GX5/16" 31G X 5/16" 1 ML Misc USE THREE TIMES DAILY TO INJECT INSULIN   levothyroxine 137 MCG tablet Commonly known as: SYNTHROID Take 137 mcg by mouth daily before breakfast.   magnesium 30 MG tablet Take 30 mg by mouth at bedtime.   metFORMIN 500 MG tablet Commonly known as: GLUCOPHAGE TAKE 1 TABLET BY MOUTH EVERY MORNING AND 2 TABLETS EVERY EVENING   metoprolol succinate 25 MG 24 hr tablet Commonly known as: TOPROL-XL Take 25 mg by mouth every evening.   metoprolol succinate 25 MG 24 hr tablet Commonly known as: TOPROL-XL Take 1 tablet (25 mg total) by mouth at bedtime.   metoprolol succinate 50 MG 24 hr tablet Commonly known  as: TOPROL-XL Take (1 tablet) 50 mg every morning. Take with or immediately following a meal. To be taken in addition to 25 mg tablet every evening.   NOVOLIN N FLEXPEN [place] Inject 30 Units into the skin daily.   NovoLIN N FlexPen ReliOn 100 UNIT/ML Kiwkpen Generic drug: Insulin NPH (Human) (Isophane) INJECT 25 UNITS INTO THE SKIN TWICE DAILY   OneTouch Delica Lancets 50Y Misc Use to check blood sugars once daily   Ozempic (0.25 or 0.5 MG/DOSE) 2 MG/1.5ML Sopn Generic drug: Semaglutide(0.25 or 0.5MG/DOS) TITRATE 0.25MG ONCE WEEKLY FOR 4 WEEKS THEN 0.5MG ONCE WEEKLY ONCE A WEEK   pioglitazone 15 MG tablet Commonly known as: ACTOS TAKE 1 TABLET(15 MG) BY MOUTH DAILY   prednisoLONE acetate 1 % ophthalmic suspension Commonly known as: PRED FORTE Place 2 drops into the right eye daily.   timolol 0.5 % ophthalmic solution Commonly known as: TIMOPTIC Place 1 drop into both eyes daily.   vitamin B-12 1000 MCG tablet Commonly known as: CYANOCOBALAMIN Take 1,000 mcg by  mouth daily.   vitamin C 500 MG tablet Commonly known as: ASCORBIC ACID Take 500 mg by mouth daily.       Allergies:  Allergies  Allergen Reactions  . Pneumococcal Vaccine Anaphylaxis    weakness  . Pneumovax 23 [Pneumococcal Vac Polyvalent] Anaphylaxis    weakness  . Other Hives    Other reaction(s): Other (See Comments) Pneumonia  Vaccine- very ill  . Sulfa Antibiotics Hives    Past Medical History:  Diagnosis Date  . Anemia    s/p Heme work -up normal EGD and colonscopy in 2012 per pt,neg SPEP  . Anxiety   . Aortic stenosis    mild by echo 07/2019  . Bilateral carotid artery stenosis 06/08/2015   1-39% bilateral  . Bradycardia   . CKD (chronic kidney disease), stage III ([place])   . Diabetes mellitus without complication ([place])    type 2  . GERD (gastroesophageal reflux disease)   . Gout   . HSV-1 (herpes simplex virus 1) infection    Acyclovir prn  . Hyperkalemia   . Hyperlipidemia   .  Hypertension   . Joint pain    osteoarthritis by Xray- possible erosion of R 4th MCP,elevated uric  acid ,ANA +   . Morbid obesity ([place])   . OSA on CPAP   . Osteopenia   . PAF (paroxysmal atrial fibrillation) ([place])   . Pain management    Neurosurg: Dr Clydell Hakim  . Pulmonary HTN ([place])    Moderate by echo  2016. PASP 76mHg  . TIA (transient ischemic attack)    remote history of TIA's early 2000    Past Surgical History:  Procedure Laterality Date  . ABDOMINAL HYSTERECTOMY    . APPENDECTOMY    . BACK SURGERY     cervical fusion  . BLADDER SURGERY    . CESAREAN SECTION    . CHOLECYSTECTOMY      Family History  Problem Relation Age of Onset  . Arrhythmia Brother     Social History:  reports that she has never smoked. She has never used smokeless tobacco. She reports that she does not drink alcohol and does not use drugs.  Review of Systems:   Hypertension:   This has been managed by her PCP and nephrologist She is taking amlodipine 5 mg, metoprolol 25 mg and HCTZ 25 mg  BP Readings from Last 3 Encounters:  06/02/20 124/76  05/02/20 120/76  02/28/20 124/68   Lab Results  Component Value Date   CREATININE 1.31 (H) 05/27/2020   CREATININE 1.14 02/25/2020   CREATININE 1.38 (H) 11/22/2019     Lipids: Triglycerides have been mostly high LDL has been below 100 with Lipitor 40 mg prescribed by her cardiologist She is also on 15 mg of Actos Last labs were done nonfasting  Lab Results  Component Value Date   CHOL 150 05/27/2020   HDL 29.70 (L) 05/27/2020   LDLCALC 50 01/10/2018   LDLDIRECT 85.0 05/27/2020   TRIG 203.0 (H) 05/27/2020   CHOLHDL 5 05/27/2020    Renal insufficiency: Her creatinine levels are as follows:  Has seen nephrologist  Lisinopril caused hyperkalemia previously  Lab Results  Component Value Date   CREATININE 1.31 (H) 05/27/2020   CREATININE 1.14 02/25/2020   CREATININE 1.38 (H) 11/22/2019     She has hypothyroidism and is  taking 137 mcg daily from her PCP  Last TSH as follows  She is regular with taking her levothyroxine before breakfast and has not  missed any doses  Feels tired recently  Lab Results  Component Value Date   TSH 9.98 (H) 05/27/2020   TSH 3.32 08/16/2019   TSH 1.84 12/13/2018   FREET4 1.01 05/27/2020   FREET4 1.15 12/13/2018   FREET4 1.52 01/10/2018     She has been on gabapentin 100 mg hs for mild burning and tingling    Examination:   BP 124/76   Pulse 76   Ht 5' 1.5" (1.562 m)   Wt 234 lb (106.1 kg)   SpO2 98%   BMI 43.50 kg/m   Body mass index is 43.5 kg/m.      ASSESSMENT/ PLAN:   Diabetes type 2 with obesity, insulin requiring:    See history of present illness for detailed discussion of  current management, blood sugar patterns and issues identified  Her A1c is slightly higher at 7.2  Taking twice daily NPH and Ozempic since 2020  Although her weight has improved since last visit her blood sugars are likely higher during the day This is from her reducing her insulin on her previous visits Also higher sugars may be related to sometimes high-fat meals Timing of insulin can also be better in the morning  Recommendations:  More consistent glucose monitoring  She needs to check her sugars after supper to see if she needs any mealtime insulin and also what different foods are doing to her sugars  She should take her morning insulin at least an hour before her first meal sensitive only NPH  Increase morning dose to 18  She can save on her Ozempic by switching to the 1 mg and dialing 38 clicks weekly, this should allow her to take her medication regularly every week  Stay on Actos and Metformin and same dose of bedtime NPH, 15 units   LIPIDS: Triglycerides are relatively high with taking atorvastatin alone, this is prescribed by her cardiologist We will consider adding fenofibrate if fasting triglycerides are persistently high  HYPOTHYROIDISM: TSH is  high and she is complaining of fatigue We will go up to 150 mcg on levothyroxine now instead of 137  Needs follow-up on the next visit Follow-up in 2 months for reassessment of thyroid levels   There are no Patient Instructions on file for this visit.   Elayne Snare 06/02/2020, 10:54 AM

## 2020-06-02 NOTE — Patient Instructions (Addendum)
Check blood sugars on waking up 3 days a week  Also check blood sugars about 2 hours after meals and do this after different meals by rotation  Recommended blood sugar levels on waking up are 90-130 and about 2 hours after meal is 130-160  Please bring your blood sugar monitor to each visit, thank you  INSULIN 18 UNITS IN AM AND 15 AT NITE  OZEMPIC 1 MG PEN: DIAL 38 clicks each dose weekly

## 2020-06-02 NOTE — Addendum Note (Signed)
Addended by: Jefferson Fuel on: 06/02/2020 01:03 PM   Modules accepted: Orders

## 2020-06-02 NOTE — Patient Outreach (Signed)
[facility]) Care Management Chronic Special Needs Program    06/02/2020  Name: Tina Hester, DOB: 03/06/1941  MRN: [id number]   Ms. Tina Hester is enrolled in a chronic special needs plan for Diabetes. Outreach call to client for follow up telephone assessment.  No answer to telephone.  RN care manager updated individualized care plan.  Goals    .  Client understands the importance of follow-up with providers by attending scheduled visits      Continue to attend all scheduled follow up appointments    .  Client verbalize knowledge of Heart Failure disease self management skills by 6 months      Signs and symptoms of heart failure reviewed.  Client verbalizes understanding. Review [employer] calendar (in the back section) sent in the mail for Heart failure information/ action plan. Read the "color" heart zones and know if you are in the red, yellow or green zones and be prepared to discuss with RN on each call. Call your provider if you feel you are getting in the "yellow" zone. Call 911 if you are in the "red" zone, unrelieved shortness of breath. It is important to weigh daily and write down weights.  Pay attention to your body if you have any shortness of breath, swelling in your feet, ankles, legs or your waistband gets tight. Call your provider if you gain 3 pounds overnight or 5 pounds in a week. Take your medications as prescribed, many will cause you to use the bathroom more. Follow a low salt meal plan, read food labels for the sodium (salt) content. Your provider may restrict or limit how much liquids you drink every day.  Increase exercise only if you are able to do it.  Follow doctor recommendations.      .  Client will report no worsening of symptoms of Atrial Fibrillation within the next 6 months      Please review A-fib Action Plan in the back of [employer] calendar. Please report to your health care provider any  worsening of symptoms such as racing or irregular heart beat that may be uncomfortable, shortness of breath with or without chest pain, dizziness, or weakness. Call 911 for severe symptoms such as chest pain, fainting, struggling to breathe. Reviewed importance of taking anticoagulant (blood thinner) to prevent stroke. Increase exercise only if you are able to do it.  Follow doctor recommendations. Follow up with your cardiologist (heart doctor) for yearly visits. Please follow up with your health care provider as recommended.     .  Client will report no worsening of symptoms related to heart disease within the next 6 months      Notify provider for symptoms of chest pain, sweating, nausea/ vomiting, irregular heartbeat, palpitations, rapid heart rate, shortness of breath, dizziness or fainting. Call 911 for severe symptoms of chest pain or shortness of breath. Take medications as prescribed. Follow a low salt meal plan, limit or avoid alcohol. Client reported no signs or of cardiac issues. Increase exercise only if you are able to do it.  Follow doctor recommendations. Follow up with your cardiologist (heart doctor) for yearly visits.      .  Client will verbalize knowledge of self management of Hypertension as evidences by BP reading of 140/90 or less; or as defined by provider      take blood pressure medications as prescribed. Plan to check blood pressure regularly and take results to your health care provider appointments. Plan to  follow a low salt diet. Increase activity as tolerated. Follow up with your health care provider as recommended. Please ask your health care provider " what is my target blood pressure range". Blood pressure 138/70 on 11/26/19     .  Client/Caregiver will verbalize understanding of instructions related to self-care and safety      Please use your walker as needed Ask for assistance as needed Keep pathways clear       .  HEMOGLOBIN A1C < 7      Your  last documented AIC is 7.2 on 05/27/20.  Have your [facility] checked every 6 months if you are at goal or every 3 months if you are not at goal. Check blood sugars daily before eating with goal of 80-130.  You can also check 1 1/2 hours after eating with goal of 180 or less. Plan to eat low carbohydrate and low salt meals, watch portion sizes and avoid sugar sweetened drinks.  Discussed carbohydrate control meals. Reviewed signs and symptoms of hyperglycemia (high blood sugar) and hypoglycemia (low blood sugar) and actions to take. Review [employer] calendar (sent in the mail) for diabetes action plan in the back. Increase activity only if you are able to do it.  Follow doctor recommendations. RN care manager provided EMMI education article "Diabetic Meal Planning"       .  Maintain timely refills of diabetic medication as prescribed within the year .      Take medications as prescribed. Follow up with your health care provider if you have any questions. Please contact your assigned RN care manager if you have difficulty obtaining medications.      .  Obtain annual  Lipid Profile, LDL-C      Per medical record review, Lipid profile completed on 05/27/20   LDL=85 The goal for LDL is less than 70mg /dl as you are at high risk for complications. Try to avoid saturated fats, trans-fats and eat more fiber. Plan to take statin (cholesterol) medicine as ordered.      .  Obtain Annual Eye (retinal)  Exam       Your last documented eye exam was on 01/25/20. Diabetes can affect your vision.  Plan to have a dilated eye exam every year. Advised client to keep and/ or schedule appointment with eye doctor.      .  Obtain Annual Foot Exam      Your doctor should check your bare feet at each visit. Diabetes can affect the nerves in your feet, causing decreased feeling or numbness. Check your feet and in-between toes daily for cuts, bruises, redness, blisters or sores.  If you cannot reach them,  use a mirror. Wash feet with soap and water, dry feet well especially between toes.  Don't use too much lotion. Wear shoes that are not too tight and don't walk barefoot.     .  Obtain annual screen for micro albuminuria (urine) , nephropathy (kidney problems)      Diabetes can affect your kidneys. It is important for your doctor to check your urine at least once a year  These tests show how your kidneys are working.     .  Obtain Hemoglobin A1C at least 2 times per year      Hemoglobin AIC completed on 05/27/20 Continue to keep follow up appointments with provider and have labwork completed as recommended.    .  Patient Stated " Be more active" (pt-stated)      Before beginning  any physical activity/ exercise program, please speak with your doctor Client is trying to walk more- continue to keep up the good work on this Consulting civil engineer provided EMMI education " Exercise and Aging"     .  Visit Primary Care Provider or Endocrinologist at least 2 times per year       Last primary care provider visit 07/02/19, last endocrinology visit 02/28/20. Call and schedule yearly physical and follow up visits as recommended by your providers.       PLAN RN care manager faxed today's note with updated individualized care plan to primary care provider, mailed unsuccessful outreach letter to client along with updated individualized care plan and education materials.  Outreach client within 1-2 weeks  Jacqlyn Larsen [facility], BSN [place], [place]

## 2020-06-03 DIAGNOSIS — Z20822 Contact with and (suspected) exposure to covid-19: Secondary | ICD-10-CM | POA: Diagnosis not present

## 2020-06-03 DIAGNOSIS — Z03818 Encounter for observation for suspected exposure to other biological agents ruled out: Secondary | ICD-10-CM | POA: Diagnosis not present

## 2020-06-05 ENCOUNTER — Other Ambulatory Visit: Payer: Self-pay | Admitting: *Deleted

## 2020-06-05 NOTE — Patient Outreach (Signed)
  [facility]) Care Management Chronic Special Needs Program    06/05/2020  Name: Tina Hester, DOB: 10-10-1940  MRN: [id number]   Ms. Tina Hester is enrolled in a chronic special needs plan for Diabetes.  Outreach call to client ( 2nd attempt) for telephone follow up assessment, no answer to telephone.  PLAN Outreach client in 1-2 weeks  Jacqlyn Larsen [facility], BSN [place], [place]

## 2020-06-12 ENCOUNTER — Other Ambulatory Visit: Payer: Self-pay | Admitting: *Deleted

## 2020-06-12 NOTE — Patient Outreach (Addendum)
°  [facility]) Care Management Chronic Special Needs Program    06/12/2020  Name: LATIKA KRONICK, DOB: Apr 30, 1941  MRN: [id number]   Ms. Jazell Rosenau is enrolled in a chronic special needs plan for Diabetes.  Outreach call to client for telephone follow up assessment (3rd attempt), client states she is driving in the car and is unable to talk, requests call back another day.  PLAN Outreach client in 1-2 weeks  Jacqlyn Larsen [facility], BSN [place], [place]

## 2020-06-13 ENCOUNTER — Encounter: Payer: Self-pay | Admitting: Podiatry

## 2020-06-13 ENCOUNTER — Ambulatory Visit: Payer: HMO | Admitting: Podiatry

## 2020-06-13 ENCOUNTER — Other Ambulatory Visit: Payer: Self-pay

## 2020-06-13 DIAGNOSIS — M79675 Pain in left toe(s): Secondary | ICD-10-CM | POA: Diagnosis not present

## 2020-06-13 DIAGNOSIS — L84 Corns and callosities: Secondary | ICD-10-CM | POA: Diagnosis not present

## 2020-06-13 DIAGNOSIS — Z794 Long term (current) use of insulin: Secondary | ICD-10-CM

## 2020-06-13 DIAGNOSIS — E119 Type 2 diabetes mellitus without complications: Secondary | ICD-10-CM | POA: Diagnosis not present

## 2020-06-13 DIAGNOSIS — M79674 Pain in right toe(s): Secondary | ICD-10-CM | POA: Diagnosis not present

## 2020-06-13 DIAGNOSIS — B351 Tinea unguium: Secondary | ICD-10-CM

## 2020-06-16 NOTE — Progress Notes (Signed)
Subjective:  Patient ID: Tina Hester, female    DOB: May 11, 1941,  MRN: [id number]  79 y.o. female presents with preventative diabetic foot care and painful callus(es) b/l and painful thick toenails that are difficult to trim. Painful toenails interfere with ambulation. Aggravating factors include wearing enclosed shoe gear. Pain is relieved with periodic professional debridement. Painful calluses are aggravated when weightbearing with and without shoegear. Pain is relieved with periodic professional debridement..    Review of Systems: Negative except as noted in the HPI.  Past Medical History:  Diagnosis Date  . Anemia    s/p Heme work -up normal EGD and colonscopy in 2012 per pt,neg SPEP  . Anxiety   . Aortic stenosis    mild by echo 07/2019  . Bilateral carotid artery stenosis 06/08/2015   1-39% bilateral  . Bradycardia   . CKD (chronic kidney disease), stage III ([place])   . Diabetes mellitus without complication ([place])    type 2  . GERD (gastroesophageal reflux disease)   . Gout   . HSV-1 (herpes simplex virus 1) infection    Acyclovir prn  . Hyperkalemia   . Hyperlipidemia   . Hypertension   . Joint pain    osteoarthritis by Xray- possible erosion of R 4th MCP,elevated uric  acid ,ANA +   . Morbid obesity ([place])   . OSA on CPAP   . Osteopenia   . PAF (paroxysmal atrial fibrillation) ([place])   . Pain management    Neurosurg: Dr Clydell Hakim  . Pulmonary HTN ([place])    Moderate by echo  2016. PASP 26mHg  . TIA (transient ischemic attack)    remote history of TIA's early 2000   Past Surgical History:  Procedure Laterality Date  . ABDOMINAL HYSTERECTOMY    . APPENDECTOMY    . BACK SURGERY     cervical fusion  . BLADDER SURGERY    . CESAREAN SECTION    . CHOLECYSTECTOMY     Patient Active Problem List   Diagnosis Date Noted  . Hematoma of lower leg 12/18/2018  . Low back pain 12/18/2018  . Contusion of left knee 06/16/2018  . Pain in left knee 06/16/2018  . Rib  pain 06/16/2018  . Glaucoma suspect of left eye 12/22/2017  . Secondary open-angle glaucoma of right eye, indeterminate stage 12/22/2017  . Postural dizziness with presyncope 12/13/2017  . Chest pain 12/13/2017  . Hyperkalemia 12/13/2017  . Acute kidney injury ([place] 12/13/2017  . Fluid overload 12/13/2017  . Bradycardia 12/13/2017  . CHF (congestive heart failure) ([place] 12/13/2017  . Aortic stenosis 11/02/2016  . Aortic valve disorder 11/02/2016  . Abrasion of right cornea 04/12/2016  . Status post corneal transplant 04/12/2016  . Pulmonary HTN ([facility] 12/15/2015  . Bilateral carotid artery stenosis 06/08/2015  . Hypertension   . OSA on CPAP   . Persistent atrial fibrillation ([facility]   . Morbid obesity ([facility] 09/04/2013  . Gout 09/04/2013  . DM2 (diabetes mellitus, type 2) ([facility] 05/29/2013  . Hyperlipidemia 05/29/2013  . Anxiety 01/31/2012  . CKD (chronic kidney disease), stage III ([facility] 01/31/2012  . Diabetes mellitus type 2 with complications, uncontrolled ([facility] 01/31/2012  . Hypothyroidism 01/31/2012  . TIA (transient ischemic attack) 01/31/2012    Current Outpatient Medications:  .  acetaminophen (TYLENOL) 325 MG tablet, Take by mouth every 6 (six) hours as needed for mild pain or moderate pain., Disp: , Rfl:  .  acyclovir (ZOVIRAX) 400 MG tablet, Take 400 mg by mouth 2 (two)  times daily. , Disp: , Rfl:  .  ALPRAZolam (XANAX) 0.25 MG tablet, Take 0.25 mg by mouth at bedtime as needed for anxiety., Disp: , Rfl:  .  amLODipine (NORVASC) 5 MG tablet, TAKE 1 TABLET(5 MG) BY MOUTH DAILY, Disp: 90 tablet, Rfl: 3 .  atorvastatin (LIPITOR) 40 MG tablet, TAKE 1 TABLET BY MOUTH DAILY AT 6PM, Disp: 90 tablet, Rfl: 1 .  Blood Glucose Monitoring Suppl (FREESTYLE FREEDOM LITE) w/Device KIT, 1 each by Does not apply route in the morning and at bedtime. Use Freestyle lite meter to check blood sugar twice daily., Disp: 1 kit, Rfl: 1 .  CALCIUM PO, Take 1 tablet by mouth daily. 600 mg, Disp: , Rfl:   .  cephALEXin (KEFLEX) 500 MG capsule, Take 500 mg by mouth 4 (four) times daily., Disp: , Rfl:  .  Cholecalciferol 2000 units TABS, Take 4,000 Units by mouth daily., Disp: , Rfl:  .  COLCRYS 0.6 MG tablet, Take 0.6 mg by mouth daily as needed (gout). , Disp: , Rfl:  .  ELIQUIS 5 MG TABS tablet, TAKE 1 TABLET(5 MG) BY MOUTH TWICE DAILY, Disp: 180 tablet, Rfl: 1 .  gabapentin (NEURONTIN) 100 MG capsule, Take 100-200 mg by mouth at bedtime. , Disp: , Rfl:  .  glucose blood (FREESTYLE LITE) test strip, Use to test blood sugar twice daily. Dx: E11.9, Disp: 200 each, Rfl: 1 .  hydrochlorothiazide (HYDRODIURIL) 25 MG tablet, Take 1 tablet (25 mg total) by mouth daily. TAKE 1 TABLET(25 MG) BY MOUTH DAILY., Disp: 90 tablet, Rfl: 3 .  hydrocortisone cream 1 %, Apply 1 application topically daily as needed for itching., Disp: , Rfl:  .  Insulin NPH Human, Isophane, (NOVOLIN N FLEXPEN [place]), Inject 30 Units into the skin daily., Disp: , Rfl:  .  Insulin Syringe-Needle U-100 (INSULIN SYRINGE 1CC/31GX5/16") 31G X 5/16" 1 ML MISC, USE THREE TIMES DAILY TO INJECT INSULIN, Disp: 100 each, Rfl: 0 .  levothyroxine (SYNTHROID) 150 MCG tablet, Take 1 tablet (150 mcg total) by mouth daily., Disp: 90 tablet, Rfl: 3 .  magnesium 30 MG tablet, Take 30 mg by mouth at bedtime. , Disp: , Rfl:  .  metFORMIN (GLUCOPHAGE) 500 MG tablet, TAKE 1 TABLET BY MOUTH EVERY MORNING AND 2 TABLETS EVERY EVENING, Disp: 270 tablet, Rfl: 3 .  metoprolol succinate (TOPROL-XL) 25 MG 24 hr tablet, Take 1 tablet (25 mg total) by mouth at bedtime., Disp: 90 tablet, Rfl: 3 .  metoprolol succinate (TOPROL-XL) 25 MG 24 hr tablet, Take 25 mg by mouth every evening., Disp: , Rfl:  .  metoprolol succinate (TOPROL-XL) 50 MG 24 hr tablet, Take (1 tablet) 50 mg every morning. Take with or immediately following a meal. To be taken in addition to 25 mg tablet every evening., Disp: 90 tablet, Rfl: 3 .  NEEDLE, DISP, 30 G (BD DISP NEEDLES) 30G X 1/2" MISC, 1  each by Does not apply route 2 (two) times daily before a meal., Disp: 100 each, Rfl: 5 .  NOVOLIN N FLEXPEN RELION 100 UNIT/ML [place], INJECT 25 UNITS INTO THE SKIN TWICE DAILY, Disp: 15 mL, Rfl: 1 .  ONETOUCH DELICA LANCETS 78I MISC, Use to check blood sugars once daily, Disp: 50 each, Rfl: 3 .  pioglitazone (ACTOS) 15 MG tablet, TAKE 1 TABLET(15 MG) BY MOUTH DAILY, Disp: 90 tablet, Rfl: 1 .  prednisoLONE acetate (PRED FORTE) 1 % ophthalmic suspension, Place 2 drops into the right eye daily. , Disp: , Rfl: 2 .  Semaglutide, 1 MG/DOSE, (OZEMPIC, 1 MG/DOSE,) 2 MG/1.5ML SOPN, Inject 1 mg into the skin once a week., Disp: 1.5 mL, Rfl: 2 .  Semaglutide,0.25 or 0.5MG/DOS, (OZEMPIC, 0.25 OR 0.5 MG/DOSE,) 2 MG/1.5ML SOPN, Inject 0.5 mg into the skin once a week., Disp: 1.5 mL, Rfl: 0 .  timolol (TIMOPTIC) 0.5 % ophthalmic solution, Place 1 drop into both eyes daily. , Disp: , Rfl: 1 .  vitamin B-12 (CYANOCOBALAMIN) 1000 MCG tablet, Take 1,000 mcg by mouth daily., Disp: , Rfl:  .  vitamin C (ASCORBIC ACID) 500 MG tablet, Take 500 mg by mouth daily., Disp: , Rfl:  Allergies  Allergen Reactions  . Pneumococcal Vaccine Anaphylaxis    weakness  . Pneumovax 23 [Pneumococcal Vac Polyvalent] Anaphylaxis    weakness  . Other Hives    Other reaction(s): Other (See Comments) Pneumonia  Vaccine- very ill  . Sulfa Antibiotics Hives   Social History   Occupational History  . Not on file  Tobacco Use  . Smoking status: Never Smoker  . Smokeless tobacco: Never Used  Substance and Sexual Activity  . Alcohol use: No  . Drug use: No  . Sexual activity: Not on file    Objective:   Constitutional Pt is a pleasant 79 y.o. Caucasian female in NAD. AAO x 3.   Vascular Capillary refill time to digits immediate b/l. Palpable pedal pulses b/l LE. Pedal hair absent. Lower extremity skin temperature gradient within normal limits. No pain with calf compression b/l. No cyanosis or clubbing noted.  Neurologic Normal  speech. Protective sensation intact 5/5 intact bilaterally with 10g monofilament b/l. Vibratory sensation intact b/l.  Dermatologic Pedal skin with normal turgor, texture and tone bilaterally. No open wounds bilaterally. No interdigital macerations bilaterally. Toenails 1-5 b/l elongated, discolored, dystrophic, thickened, crumbly with subungual debris and tenderness to dorsal palpation. Hyperkeratotic lesion(s) submet head 1 left foot and submet head 1 right foot.  No erythema, no edema, no drainage, no flocculence.  Orthopedic: Normal muscle strength 5/5 to all lower extremity muscle groups bilaterally. No pain crepitus or joint limitation noted with ROM b/l. Hammertoes noted to the L 5th toe and R 5th toe.   Radiographs: None Assessment:   1. Pain due to onychomycosis of toenails of both feet   2. Callus   3. Type 2 diabetes mellitus without complication, with long-term current use of insulin ([place])    Plan:  Patient was evaluated and treated and all questions answered.  Onychomycosis with pain -Nails palliatively debridement as below. -Educated on self-care  Procedure: Nail Debridement Rationale: Pain Type of Debridement: manual, sharp debridement. Instrumentation: Nail nipper, rotary burr. Number of Nails: 10  -Examined patient. -Toenails 1-5 b/l were debrided in length and girth with sterile nail nippers and dremel without iatrogenic bleeding.  -Callus(es) submet head 1 left foot and submet head 1 right foot pared utilizing sterile scalpel blade without complication or incident. Total number debrided =2. -Patient to report any pedal injuries to medical professional immediately. -Patient to continue soft, supportive shoe gear daily. -Patient/POA to call should there be question/concern in the interim.  Return in about 3 months (around 09/13/2020) for diabetic nail and callus trim.  Marzetta Board, DPM

## 2020-06-17 ENCOUNTER — Telehealth: Payer: Self-pay | Admitting: Cardiology

## 2020-06-17 NOTE — Telephone Encounter (Signed)
Reached out to [facility] and was informed patient needs to call in to order her supplies and no prescription is needed.

## 2020-06-17 NOTE — Telephone Encounter (Signed)
Patient calling for a prescription for nose cushions for her CPAP.

## 2020-06-23 ENCOUNTER — Encounter: Payer: Self-pay | Admitting: *Deleted

## 2020-06-23 ENCOUNTER — Other Ambulatory Visit: Payer: Self-pay | Admitting: *Deleted

## 2020-06-23 NOTE — Patient Outreach (Addendum)
[facility]) Care Management Chronic Special Needs Program  06/23/2020  Name: Tina Hester DOB: 12/16/1940  MRN: [id number]  Tina Hester is enrolled in a chronic special needs plan for Diabetes. Reviewed and updated care plan.  Subjective: Client reports she continues to live with her daughter, son in law and grandchildren, continues to drive, attends all scheduled doctor's appointments, reports constipation issue "is much better, I'm taking probiotics"  Client reports she has had issues with affording ozempic in the past and has received some samples and has the medication on hand, reports [facility] is working with her and they will visit her tomorrow 06/24/20 and she discusses any medication issues with them also.  Client reports blood sugar "has been good".  Client still does not weigh and does not intend to start.  Client requests another 24 hour nurse line magnet and advanced directives packet be mailed as she cannot find hers.  Client reports she continues to use Freestyle Lite glucometer with no issues and did not pursue getting another glucometer.  Health Risk Assessment completed with client.  Goals Addressed              This Visit's Progress   .  COMPLETED:  Acknowledge receipt of Programme researcher, broadcasting/film/video        Client requests another advanced directives packet be mailed Consulting civil engineer mailed client Advanced Directives packet. RN care manager reviewed importance of having Advanced Directives forms completed. Plan to have Advanced Directives (Living Will and POA) forms notarized and witnessed. RN care manager mailed EMMI education article "Advanced Directives"     .  Client understands the importance of follow-up with providers by attending scheduled visits   On track     Continue to attend all scheduled follow up appointments    .  Client verbalize knowledge of Heart Failure disease self management skills by 6 months        Signs and  symptoms of heart failure reviewed.  Client verbalizes understanding. Review [employer] calendar (in the back section) sent in the mail for Heart failure information/ action plan. Read the "color" heart zones and know if you are in the red, yellow or green zones and be prepared to discuss with RN on each call. Call your provider if you feel you are getting in the "yellow" zone. Call 911 if you are in the "red" zone, unrelieved shortness of breath. It is important to weigh daily and write down weights.  Pay attention to your body if you have any shortness of breath, swelling in your feet, ankles, legs or your waistband gets tight. Call your provider if you gain 3 pounds overnight or 5 pounds in a week. Take your medications as prescribed, many will cause you to use the bathroom more. Follow a low salt meal plan, read food labels for the sodium (salt) content. Your provider may restrict or limit how much liquids you drink every day.  Increase exercise only if you are able to do it.  Follow doctor recommendations. EMMI education article provided "Heart Failure Exercise Guide" Review and plan to discuss with RN during next telephonic assessment.   Use 24 hour nurse advice line as needed at [phone]      .  Client will report no worsening of symptoms of Atrial Fibrillation within the next 6 months        Please review A-fib Action Plan in the back of [employer] calendar. Please report to your health  care provider any worsening of symptoms such as racing or irregular heart beat that may be uncomfortable, shortness of breath with or without chest pain, dizziness, or weakness. Call 911 for severe symptoms such as chest pain, fainting, struggling to breathe. Reviewed importance of taking anticoagulant (blood thinner) to prevent stroke. Increase exercise only if you are able to do it.  Follow doctor recommendations. Follow up with your cardiologist (heart doctor) for yearly  visits. Please follow up with your health care provider as recommended. RN care manager provided education "Atrial Fibrillation"       .  Client will report no worsening of symptoms related to heart disease within the next 6 months        Notify provider for symptoms of chest pain, sweating, nausea/ vomiting, irregular heartbeat, palpitations, rapid heart rate, shortness of breath, dizziness or fainting. Call 911 for severe symptoms of chest pain or shortness of breath. Take medications as prescribed. Follow a low salt meal plan, limit or avoid alcohol. Client reported no signs or of cardiac issues. Increase exercise only if you are able to do it.  Follow doctor recommendations. Follow up with your cardiologist (heart doctor) for yearly visits.         .  Client will verbalize knowledge of self management of Hypertension as evidences by BP reading of 140/90 or less; or as defined by provider        Plan to check blood pressure regularly.  If you do not have a B/P monitor (cuff), one can be provided to you.  Write results in your [employer] calendar (in the back section). Reviewed blood pressure medication from EMR. Take B/P medications as ordered.  Some may cause you to use the bathroom more. Plan to eat low salt and heart healthy meals full of fruits, vegetables, whole grains, lean protein and limit fat and sugars. Increase activity as tolerated. Reviewed lifestyle modification- smoking cessation, weight control and reducing stress. EMMI education article provided "Controlling your blood pressure through lifestyle" Review and plan to discuss with RN during next telephonic assessment.     .  Client/Caregiver will verbalize understanding of instructions related to self-care and safety   On track     Please use your walker as needed Ask for assistance as needed Keep pathways clear          .  HEMOGLOBIN A1C < 7        Your last documented AIC is 7.2 on 05/27/20.  Have your  [facility] checked every 6 months if you are at goal or every 3 months if you are not at goal. Check blood sugars daily before eating with goal of 80-130.  You can also check 1 1/2 hours after eating with goal of 180 or less. Plan to eat low carbohydrate and low salt meals, watch portion sizes and avoid sugar sweetened drinks.  Discussed carbohydrate control meals. Reviewed signs and symptoms of hyperglycemia (high blood sugar) and hypoglycemia (low blood sugar) and actions to take. Review [employer] calendar (sent in the mail) for diabetes action plan in the back. Increase activity only if you are able to do it.  Follow doctor recommendations.        .  Maintain timely refills of diabetic medication as prescribed within the year .        Take medications as prescribed. Follow up with your health care provider if you have any questions. Please contact your assigned RN care manager if you have  difficulty obtaining medications. Client is working with [facility] (home visit scheduled for 06/24/20 per client)      .  Obtain annual  Lipid Profile, LDL-C        Per medical record review, Lipid profile completed on 05/27/20   LDL=85 The goal for LDL is less than 70mg /dl as you are at high risk for complications. Try to avoid saturated fats, trans-fats and eat more fiber. Plan to take statin (cholesterol) medicine as ordered.         .  Obtain Annual Eye (retinal)  Exam         Your last documented eye exam was on 01/25/20. Diabetes can affect your vision.  Plan to have a dilated eye exam every year. Advised client to keep and/ or schedule appointment with eye doctor.        .  Obtain Annual Foot Exam        Your doctor should check your bare feet at each visit. Diabetes can affect the nerves in your feet, causing decreased feeling or numbness. Check your feet and in-between toes daily for cuts, bruises, redness, blisters or sores.  If you cannot reach them, use a mirror. Wash feet with  soap and water, dry feet well especially between toes.  Don't use too much lotion. Wear shoes that are not too tight and don't walk barefoot.         .  Obtain annual screen for micro albuminuria (urine) , nephropathy (kidney problems)        Diabetes can affect your kidneys. It is important for your doctor to check your urine at least once a year  These tests show how your kidneys are working.     .  Obtain Hemoglobin A1C at least 2 times per year        Hemoglobin AIC completed on 05/27/20 Continue to keep follow up appointments with provider and have labwork completed as recommended.      .  Patient Stated " Be more active" (pt-stated)        Before beginning any physical activity/ exercise program, please speak with your doctor Client is trying to walk more- continue to keep up the good work on this      .  Visit Primary Care Provider or Endocrinologist at least 2 times per year         Last primary care provider visit 05/27/20 last endocrinology visit 06/02/20. Call and schedule yearly physical and follow up visits as recommended by your providers.       Plan:    RN care manager faxed today's note and updated individualized care plan to primary care provider, mailed updated individualized care plan to client along with successful outreach letter, education materials, advanced directives packet, 24 hour nurse advice line magnet.   Chronic care management coordinator will outreach in:  6 months (per Tier 2)  Kassie Mends Nursing/RN Coord [facility] Case Manager, C-SNP  [phone]  .

## 2020-06-25 DIAGNOSIS — G4733 Obstructive sleep apnea (adult) (pediatric): Secondary | ICD-10-CM | POA: Diagnosis not present

## 2020-06-30 DIAGNOSIS — H4051X4 Glaucoma secondary to other eye disorders, right eye, indeterminate stage: Secondary | ICD-10-CM | POA: Diagnosis not present

## 2020-06-30 DIAGNOSIS — H40002 Preglaucoma, unspecified, left eye: Secondary | ICD-10-CM | POA: Diagnosis not present

## 2020-07-04 DIAGNOSIS — M5416 Radiculopathy, lumbar region: Secondary | ICD-10-CM | POA: Diagnosis not present

## 2020-07-09 ENCOUNTER — Other Ambulatory Visit: Payer: Self-pay | Admitting: Cardiology

## 2020-07-16 DIAGNOSIS — G4733 Obstructive sleep apnea (adult) (pediatric): Secondary | ICD-10-CM | POA: Diagnosis not present

## 2020-07-22 ENCOUNTER — Other Ambulatory Visit: Payer: Self-pay

## 2020-07-22 ENCOUNTER — Telehealth (INDEPENDENT_AMBULATORY_CARE_PROVIDER_SITE_OTHER): Payer: HMO | Admitting: Cardiology

## 2020-07-22 VITALS — BP 128/57 | HR 81 | Ht 61.5 in | Wt 230.0 lb

## 2020-07-22 DIAGNOSIS — I350 Nonrheumatic aortic (valve) stenosis: Secondary | ICD-10-CM | POA: Diagnosis not present

## 2020-07-22 DIAGNOSIS — I4819 Other persistent atrial fibrillation: Secondary | ICD-10-CM

## 2020-07-22 DIAGNOSIS — I6523 Occlusion and stenosis of bilateral carotid arteries: Secondary | ICD-10-CM | POA: Diagnosis not present

## 2020-07-22 DIAGNOSIS — I10 Essential (primary) hypertension: Secondary | ICD-10-CM

## 2020-07-22 DIAGNOSIS — Z9989 Dependence on other enabling machines and devices: Secondary | ICD-10-CM

## 2020-07-22 DIAGNOSIS — G4733 Obstructive sleep apnea (adult) (pediatric): Secondary | ICD-10-CM | POA: Diagnosis not present

## 2020-07-22 DIAGNOSIS — E7800 Pure hypercholesterolemia, unspecified: Secondary | ICD-10-CM | POA: Diagnosis not present

## 2020-07-22 MED ORDER — METOPROLOL SUCCINATE ER 25 MG PO TB24: 25.0000 mg | ORAL_TABLET | Freq: Every day | ORAL | 3 refills | Status: DC

## 2020-07-22 NOTE — Progress Notes (Signed)
Virtual Visit via Telephone Note   This visit type was conducted due to national recommendations for restrictions regarding the COVID-19 Pandemic (e.g. social distancing) in an effort to limit this patient's exposure and mitigate transmission in our community.  Due to her co-morbid illnesses, this patient is at least at moderate risk for complications without adequate follow up.  This format is felt to be most appropriate for this patient at this time.  The patient did not have access to video technology/had technical difficulties with video requiring transitioning to audio format only (telephone).  All issues noted in this document were discussed and addressed.  No physical exam could be performed with this format.  Please refer to the patient's chart for her  consent to telehealth for [facility].    Date:  07/22/2020   ID:  Tina Hester, DOB 03-Nov-1940, MRN [id number] The patient was identified using 2 identifiers.  Patient Location: Home Provider Location: Office/Clinic  Date:  07/22/2020   ID:  Tina Hester, DOB [birth year], MRN [id number]   PCP:  Jonathon Jordan, MD  Cardiologist:  Fransico Him, MD  Electrophysiologist:  Constance Haw, MD   Chief Complaint:  Atrial fibrillation, OSA, HTN  History of Present Illness:    Tina Hester is a 79 y.o. female with a hx of PAF, HTN, bilateral carotid artery stenosis, aortic stenosis, morbid obesity and OSA on CPAP.  She is here today for followup and is doing well.  She denies any chest pain or pressure, SOB, DOE, PND, orthopnea, LE edema, dizziness or syncope. She had one episode a few weeks ago with her heart pounding hard.  She says that it woke her up and she does not know if she had been dreaming.  She has not had any other episodes.  She is compliant with her meds and is tolerating meds with no SE.   She is doing well with her CPAP device and thinks that she has gotten used to it.  She tolerates the  mask and feels the pressure is adequate.  Since going on CPAP she feels rested in the am and has no significant daytime sleepiness if she sleeps well the night before but does have to get up a lot to urinate at night.  Once in a while she will take a nap in the afternoon with her PAP. She denies any significant mouth or nasal dryness or nasal congestion.  She does not think that he snores.    Prior CV studies:   The following studies were reviewed today:  2D echo, carotid dopplers, labs,   Past Medical History:  Diagnosis Date  . Anemia    s/p Heme work -up normal EGD and colonscopy in 2012 per pt,neg SPEP  . Anxiety   . Aortic stenosis    mild by echo 07/2019  . Bilateral carotid artery stenosis 06/08/2015   1-39% bilateral  . Bradycardia   . CKD (chronic kidney disease), stage III ([place])   . Diabetes mellitus without complication ([place])    type 2  . GERD (gastroesophageal reflux disease)   . Gout   . HSV-1 (herpes simplex virus 1) infection    Acyclovir prn  . Hyperkalemia   . Hyperlipidemia   . Hypertension   . Joint pain    osteoarthritis by Xray- possible erosion of R 4th MCP,elevated uric  acid ,ANA +   . Morbid obesity ([place])   . OSA on CPAP   . Osteopenia   .  PAF (paroxysmal atrial fibrillation) ([place])   . Pain management    Neurosurg: Dr Clydell Hakim  . Pulmonary HTN ([place])    Moderate by echo  2016. PASP 53mHg  . TIA (transient ischemic attack)    remote history of TIA's early 2000   Past Surgical History:  Procedure Laterality Date  . ABDOMINAL HYSTERECTOMY    . APPENDECTOMY    . BACK SURGERY     cervical fusion  . BLADDER SURGERY    . CESAREAN SECTION    . CHOLECYSTECTOMY       Current Meds  Medication Sig  . acetaminophen (TYLENOL) 325 MG tablet Take by mouth every 6 (six) hours as needed for mild pain or moderate pain.  .Marland Kitchenacyclovir (ZOVIRAX) 400 MG tablet Take 400 mg by mouth 2 (two) times daily.   .Marland KitchenALPRAZolam (XANAX) 0.25 MG tablet Take 0.25 mg by  mouth at bedtime as needed for anxiety.  .Marland KitchenamLODipine (NORVASC) 5 MG tablet TAKE 1 TABLET(5 MG) BY MOUTH DAILY  . atorvastatin (LIPITOR) 40 MG tablet TAKE 1 TABLET BY MOUTH DAILY AT 6PM  . Blood Glucose Monitoring Suppl (FREESTYLE FREEDOM LITE) w/Device KIT 1 each by Does not apply route in the morning and at bedtime. Use Freestyle lite meter to check blood sugar twice daily.  .Marland KitchenCALCIUM PO Take 1 tablet by mouth daily. 600 mg  . Cholecalciferol 2000 units TABS Take 4,000 Units by mouth daily.  .Marland KitchenCOLCRYS 0.6 MG tablet Take 0.6 mg by mouth daily as needed (gout).   .Marland KitchenELIQUIS 5 MG TABS tablet TAKE 1 TABLET(5 MG) BY MOUTH TWICE DAILY  . gabapentin (NEURONTIN) 100 MG capsule Take 100-200 mg by mouth at bedtime.   .Marland Kitchenglucose blood (FREESTYLE LITE) test strip Use to test blood sugar twice daily. Dx: E11.9  . hydrochlorothiazide (HYDRODIURIL) 25 MG tablet Take 1 tablet (25 mg total) by mouth daily. TAKE 1 TABLET(25 MG) BY MOUTH DAILY.  . hydrocortisone cream 1 % Apply 1 application topically daily as needed for itching.  . Insulin NPH Human, Isophane, (NOVOLIN N FLEXPEN [place]) Inject 30 Units into the skin daily. 15 units at morning and 18 units at night  . levothyroxine (SYNTHROID) 150 MCG tablet Take 1 tablet (150 mcg total) by mouth daily.  . magnesium 30 MG tablet Take 30 mg by mouth at bedtime.   . metFORMIN (GLUCOPHAGE) 500 MG tablet TAKE 1 TABLET BY MOUTH EVERY MORNING AND 2 TABLETS EVERY EVENING  . metoprolol succinate (TOPROL-XL) 25 MG 24 hr tablet Take 1 tablet (25 mg total) by mouth at bedtime.  . metoprolol succinate (TOPROL-XL) 50 MG 24 hr tablet Take (1 tablet) 50 mg every morning. Take with or immediately following a meal. To be taken in addition to 25 mg tablet every evening.  .Marland KitchenNEEDLE, DISP, 30 G (BD DISP NEEDLES) 30G X 1/2" MISC 1 each by Does not apply route 2 (two) times daily before a meal.  . NOVOLIN N FLEXPEN RELION 100 UNIT/ML Kiwkpen INJECT 25 UNITS INTO THE SKIN TWICE DAILY (Patient  taking differently: Inject 18 Units into the skin. Take 18 units in am and 15 units in pm)  . ONETOUCH DELICA LANCETS 360YMISC Use to check blood sugars once daily  . pioglitazone (ACTOS) 15 MG tablet TAKE 1 TABLET(15 MG) BY MOUTH DAILY  . prednisoLONE acetate (PRED FORTE) 1 % ophthalmic suspension Place 2 drops into the right eye daily.   . Semaglutide, 1 MG/DOSE, (OZEMPIC, 1 MG/DOSE,) 2 MG/1.5ML  SOPN Inject 1 mg into the skin once a week.  . Semaglutide,0.25 or 0.5MG/DOS, (OZEMPIC, 0.25 OR 0.5 MG/DOSE,) 2 MG/1.5ML SOPN Inject 0.5 mg into the skin once a week.  . timolol (TIMOPTIC) 0.5 % ophthalmic solution Place 1 drop into both eyes daily.   . vitamin B-12 (CYANOCOBALAMIN) 1000 MCG tablet Take 1,000 mcg by mouth daily.  . vitamin C (ASCORBIC ACID) 500 MG tablet Take 500 mg by mouth daily.  . [DISCONTINUED] metoprolol succinate (TOPROL-XL) 25 MG 24 hr tablet Take 1 tablet (25 mg total) by mouth at bedtime.     Allergies:   Pneumococcal vaccine, Pneumovax 23 [pneumococcal vac polyvalent], Other, and Sulfa antibiotics   Social History   Tobacco Use  . Smoking status: Never Smoker  . Smokeless tobacco: Never Used  Substance Use Topics  . Alcohol use: No  . Drug use: No     Family Hx: The patient's family history includes Arrhythmia in her brother.  ROS:   Please see the history of present illness.     All other systems reviewed and are negative.   Labs/Other Tests and Data Reviewed:    Recent Labs: 05/27/2020: ALT 14; BUN 25; Creatinine, Ser 1.31; Potassium 4.4; Sodium 137; TSH 9.98   Recent Lipid Panel Lab Results  Component Value Date/Time   CHOL 150 05/27/2020 10:19 AM   TRIG 203.0 (H) 05/27/2020 10:19 AM   HDL 29.70 (L) 05/27/2020 10:19 AM   CHOLHDL 5 05/27/2020 10:19 AM   LDLCALC 50 01/10/2018 08:31 AM   LDLDIRECT 85.0 05/27/2020 10:19 AM    Wt Readings from Last 3 Encounters:  07/22/20 230 lb (104.3 kg)  06/02/20 234 lb (106.1 kg)  05/02/20 238 lb (108 kg)       Objective:    Vital Signs:  BP (!) 128/57   Pulse 81   Ht 5' 1.5" (1.562 m)   Wt 230 lb (104.3 kg)   BMI 42.75 kg/m     ASSESSMENT & PLAN:    1.  Persistent atrial fibrillation/atrial flutter -she failed Multaq.   -She was evaluated by Dr. Lennie Odor and was doing better and wanted to hold off on afib ablation -denies any bleeding problems on DOAC -She will continue on Eliquis 11m BID (age < 85and creatinine < 1.5).   -Hbg 13.2 in May 2021 and SCr 1.31 in Sept 2021   2.  Hypertension  -BP controlled on exam -continue HCTZ 242mdaily, Toprol XL 5015mam and 63m65mm and amlodipine 5mg 36mly.  -SCr stable at 1.14 in June 2021  3.  Bilateral carotid artery stenosis  -her last carotid Dopplers in 2018 showed mild carotid stenosis 1 to 39% but repeat dopplers 01/2019 essentially normal -continue statin  4.  Mild aortic stenosis  -her last 2D echo was in 2020 showing a mean aortic valve gradient 14mmH59m.  Hyperlipidemia  -Her last LDL was 85 in Sept 2021 -continue Atorvastatin 40mg d67m  6.  Morbid obesity  -I have encouraged her to get into a routine exercise program and cut back on carbs and portions.   7.  OSA - The patient is tolerating PAP therapy well without any problems. I will get a download from her DME.  The patient has been using and benefiting from PAP use and will continue to benefit from therapy.   COVID-19 Education: She is vaccinated  Time:   Today, I have spent 20 minutes with the patient with telehealth technology discussing the above problems.  Medication Adjustments/Labs and Tests Ordered: Current medicines are reviewed at length with the patient today.  Concerns regarding medicines are outlined above.  Tests Ordered: No orders of the defined types were placed in this encounter.  Medication Changes: Meds ordered this encounter  Medications  . metoprolol succinate (TOPROL-XL) 25 MG 24 hr tablet    Sig: Take 1 tablet (25 mg total) by mouth  at bedtime.    Dispense:  90 tablet    Refill:  3    **Patient requests 90 days supply**    Disposition:  Follow up in 6 month(s)  Signed, Fransico Him, MD  07/22/2020 11:02 AM    [place]

## 2020-07-22 NOTE — Patient Instructions (Signed)
Medication Instructions:  Your physician recommends that you continue on your current medications as directed. Please refer to the Current Medication list given to you today.  *If you need a refill on your cardiac medications before your next appointment, please call your pharmacy*  Follow-Up: At [facility], you and your health needs are our priority.  As part of our continuing mission to provide you with exceptional heart care, we have created designated Provider Care Teams.  These Care Teams include your primary Cardiologist (physician) and Advanced Practice Providers (APPs -  Physician Assistants and Nurse Practitioners) who all work together to provide you with the care you need, when you need it.  We recommend signing up for the patient portal called "MyChart".  Sign up information is provided on this After Visit Summary.  MyChart is used to connect with patients for Virtual Visits (Telemedicine).  Patients are able to view lab/test results, encounter notes, upcoming appointments, etc.  Non-urgent messages can be sent to your provider as well.   To learn more about what you can do with MyChart, go to [email or web address].    Your next appointment:   6 month(s)  The format for your next appointment:   In Person  Provider:   You may see Traci Turner, MD or one of the following Advanced Practice Providers on your designated Care Team:    Dayna Dunn, PA-C  Michele Lenze, PA-C   

## 2020-07-26 DIAGNOSIS — G4733 Obstructive sleep apnea (adult) (pediatric): Secondary | ICD-10-CM | POA: Diagnosis not present

## 2020-07-29 ENCOUNTER — Other Ambulatory Visit: Payer: Self-pay | Admitting: Endocrinology

## 2020-08-05 ENCOUNTER — Telehealth: Payer: Self-pay | Admitting: *Deleted

## 2020-08-05 ENCOUNTER — Other Ambulatory Visit: Payer: HMO

## 2020-08-05 NOTE — Telephone Encounter (Signed)
-----   Message from Sueanne Margarita, MD sent at 07/22/2020 11:06 AM EST ----- Please get a PAP download

## 2020-08-05 NOTE — Telephone Encounter (Signed)
Good AHI and compliance

## 2020-08-05 NOTE — Telephone Encounter (Signed)
Patient is aware and agreeable to AHI being within range at 1.5. Patient is aware and agreeable to being in compliance with machine usage Patient is aware and agreeable to no change in current pressures.

## 2020-08-05 NOTE — Telephone Encounter (Signed)
Schaafsma, DEANDA 06/09/2020 - 07/08/2020 DOB: 1941/03/29 Age: 79 years [place] Compliance Report Usage 06/09/2020 - 07/08/2020 Usage days 30/30 days (100%) >= 4 hours 30 days (100%) < 4 hours 0 days (0%) Usage hours 247 hours 37 minutes Average usage (total days) 8 hours 15 minutes Average usage (days used) 8 hours 15 minutes Median usage (days used) 8 hours 4 minutes Total used hours (value since last reset - 07/08/2020) 362 hours AirSense 11 AutoSet Serial number [id number] Mode AutoSet Min Pressure 4 cmH2O Max Pressure 18 cmH2O EPR Ramp Only EPR level 1 Response Standard Therapy Pressure - cmH2O Median: 7.1 95th percentile: 10.5 Maximum: 11.7 Leaks - L/min Median: 14.5 95th percentile: 28.2 Maximum: 45.1 Events per hour AI: 0.7 HI: 0.8 AHI: 1.5 Apnea Index Central: 0.0 Obstructive: 0.6 Unknown: 0.0 RERA Index 0.2 Cheyne-Stokes respiration (average duration per night) 0 minutes (0%) Usage - hours Printed on 08/05/2020 - ResMed AirView version 4.28.0-5.0 Page 1 of 1 Algeo, Wendell 06/09/2020 - 07/08/2020 DOB: 1941-03-17 Age: 27 years [facility] Therapy Report AirSense 11 AutoSet SN: [id number] Usage (hours) Usage days 30/30 (100%) >= 4 hour days 30 (100%) < 4 hour days 0 (0%) Days not used 0 (0%) Days no data 0 (0%) Used/day (avg.) 8.3 hrs. Leak (L/min) Set threshold 24.0 L/min Maximum (avg) 45.1 95th % (avg) 28.2 Median (avg) 14.5 Pressure (cmH2O) Mode AutoSet Set EPR Ramp Only, 1.0 Set Max Pressure 18.0 Set Min Pressure 4.0 Maximum (avg) 11.7 95th % (avg) 10.5 Median (avg) 7.1 AHI (events/hour) AHI 1.5 HI 0.8 AI 0.7 CAI 0.0 OAI 0.6 UAI 0.0 RERA 0.2 CSR% (avg) 0.0 Printed on 08/05/2020 - ResMed AirView version 4.28.0-5.0 Page 1 of 2 Mallette, Geniva 06/09/2020 - 07/08/2020 DOB: [birth year] Age: 30 years [facility] Therapy Report AirSense 11 AutoSet SN: [id number] Tidal Volume  (ml) Maximum (avg) 625 95th % (avg) 428 Median (avg) 293 Respiratory Rate (breaths/min) Maximum (avg) 22 95th % (avg) 19 Median (avg) 17 Minute Ventilation (L/min) Maximum (avg) 12.8 95th % (avg) 7.0 Median (avg) 5.2 Printed on 08/05/2020 - ResMed AirView version 4.28.0-5.0 Page 2 of 2

## 2020-08-06 ENCOUNTER — Other Ambulatory Visit (INDEPENDENT_AMBULATORY_CARE_PROVIDER_SITE_OTHER): Payer: HMO

## 2020-08-06 ENCOUNTER — Other Ambulatory Visit: Payer: Self-pay

## 2020-08-06 DIAGNOSIS — E782 Mixed hyperlipidemia: Secondary | ICD-10-CM | POA: Diagnosis not present

## 2020-08-06 DIAGNOSIS — E1165 Type 2 diabetes mellitus with hyperglycemia: Secondary | ICD-10-CM

## 2020-08-06 DIAGNOSIS — E063 Autoimmune thyroiditis: Secondary | ICD-10-CM

## 2020-08-06 DIAGNOSIS — Z794 Long term (current) use of insulin: Secondary | ICD-10-CM | POA: Diagnosis not present

## 2020-08-06 LAB — COMPREHENSIVE METABOLIC PANEL
ALT: 16 U/L (ref 0–35)
AST: 19 U/L (ref 0–37)
Albumin: 3.9 g/dL (ref 3.5–5.2)
Alkaline Phosphatase: 73 U/L (ref 39–117)
BUN: 21 mg/dL (ref 6–23)
CO2: 31 mEq/L (ref 19–32)
Calcium: 9.4 mg/dL (ref 8.4–10.5)
Chloride: 100 mEq/L (ref 96–112)
Creatinine, Ser: 1.34 mg/dL — ABNORMAL HIGH (ref 0.40–1.20)
GFR: 37.7 mL/min — ABNORMAL LOW (ref >60.00)
Glucose, Bld: 103 mg/dL — ABNORMAL HIGH (ref 70–99)
Potassium: 3.9 mEq/L (ref 3.5–5.1)
Sodium: 139 mEq/L (ref 135–145)
Total Bilirubin: 0.6 mg/dL (ref 0.2–1.2)
Total Protein: 7.3 g/dL (ref 6.0–8.3)

## 2020-08-06 LAB — LIPID PANEL
Cholesterol: 144 mg/dL (ref 0–200)
HDL: 29 mg/dL — ABNORMAL LOW (ref >39.00)
NonHDL: 115.1
Total CHOL/HDL Ratio: 5
Triglycerides: 249 mg/dL — ABNORMAL HIGH (ref 0.0–149.0)
VLDL: 49.8 mg/dL — ABNORMAL HIGH (ref 0.0–40.0)

## 2020-08-06 LAB — LDL CHOLESTEROL, DIRECT: Direct LDL: 81 mg/dL

## 2020-08-06 LAB — TSH: TSH: 1.07 u[IU]/mL (ref 0.35–4.50)

## 2020-08-07 LAB — T4, FREE: Free T4: 1.34 ng/dL (ref 0.60–1.60)

## 2020-08-08 ENCOUNTER — Ambulatory Visit (INDEPENDENT_AMBULATORY_CARE_PROVIDER_SITE_OTHER): Payer: HMO | Admitting: Endocrinology

## 2020-08-08 ENCOUNTER — Encounter: Payer: Self-pay | Admitting: Endocrinology

## 2020-08-08 ENCOUNTER — Other Ambulatory Visit: Payer: Self-pay

## 2020-08-08 VITALS — BP 140/58 | HR 120 | Temp 97.9°F | Ht 61.5 in | Wt 231.0 lb

## 2020-08-08 DIAGNOSIS — E782 Mixed hyperlipidemia: Secondary | ICD-10-CM | POA: Diagnosis not present

## 2020-08-08 DIAGNOSIS — Z794 Long term (current) use of insulin: Secondary | ICD-10-CM

## 2020-08-08 DIAGNOSIS — E1165 Type 2 diabetes mellitus with hyperglycemia: Secondary | ICD-10-CM

## 2020-08-08 DIAGNOSIS — E063 Autoimmune thyroiditis: Secondary | ICD-10-CM | POA: Diagnosis not present

## 2020-08-08 NOTE — Patient Instructions (Addendum)
Check blood sugars on waking up 2-3 days a week  Also check blood sugars about 2 hours after meals and do this after different meals by rotation  Recommended blood sugar levels on waking up are 90-130 and about 2 hours after meal is 130-160  Please bring your blood sugar monitor to each visit, thank you  Follow-up in 3 months

## 2020-08-08 NOTE — Progress Notes (Signed)
Patient ID: Tina Hester, female   DOB: [birth year], 79 y.o.   MRN: [id number]    Reason for Appointment: follow-up   History of Present Illness   Diagnosis: Type 2 DIABETES MELITUS, long-standing    Previous history:  She has been on insulin for a few years. Because of cost she has been switched from Lantus to NPH insulin twice a day She had previously been taking metformin but this was stopped when she had renal insufficiency Overall her blood sugars have been difficult to control and she has previously had relatively poor control. However with adding Actos her sugars had started improving significantly Also with her being very consistent with diet in 2015 her A1c had gone down to 6.4 To improve her control she was given a trial of the V-go pump in 2/17 but she had allergic skin reaction with the adhesive  Recent history:    Insulin regimen: Humulin N 18 units a.m.,--15 units at bedtime daily;  10 units regular insulin prn  Oral hypoglycemic drugs: Actos 15 mg, Metformin 500 mg in a.m.-1000 mg, Ozempic 0.5 mg weekly  Her A1c is last at 7.2, has been as low as 6.3   Current blood sugar patterns, management and problems identified:   She had not been taking Ozempic regularly previously causing higher readings  Now she has been taking the 0.5 dose using the 1 mg pen which is more affordable although recently for unknown reason she had a refill of 0.5 mg pen again  Most of her blood sugars at home are fairly close to normal  Her blood sugar monitoring has been done sporadically and mostly in the mornings although somewhat more than before  However does not check readings after dinner as discussed on several visits  With increasing her morning insulin to 18 units her sugars appear to be relatively better controlled with no hypoglycemia  Fasting readings are in the low 100 range and 103 in the lab in the afternoon  She is doing some walking when she is shopping  and doing chores but not any formal exercise  No hypoglycemic symptoms at any time                 Side effects from medications:She did not tolerate Invokana and was having muscle cramps  Monitors blood glucose: occasionally     Glucometer:  FreeStyle        Blood Glucose readings:   PRE-MEAL Fasting Lunch Dinner Bedtime Overall  Glucose range:  92-159      Mean/median:  115   114, 125   118   Previous data:  FASTING range 107-170  Dinnertime 178, 186 AVERAGE 153        Wt Readings from Last 3 Encounters:  08/08/20 231 lb (104.8 kg)  07/22/20 230 lb (104.3 kg)  06/02/20 234 lb (106.1 kg)   LABS:  Lab Results  Component Value Date   HGBA1C 7.2 (H) 05/27/2020   HGBA1C 6.8 (H) 02/25/2020   HGBA1C 6.3 11/22/2019   Lab Results  Component Value Date   MICROALBUR 6.2 (H) 11/22/2019   LDLCALC 50 01/10/2018   CREATININE 1.34 (H) 08/06/2020   Lab on 08/06/2020  Component Date Value Ref Range Status   Sodium 08/06/2020 139  135 - 145 mEq/L Final   Potassium 08/06/2020 3.9  3.5 - 5.1 mEq/L Final   Chloride 08/06/2020 100  96 - 112 mEq/L Final   CO2 08/06/2020  31  19 - 32 mEq/L Final   Glucose, Bld 08/06/2020 103* 70 - 99 mg/dL Final   BUN 08/06/2020 21  6 - 23 mg/dL Final   Creatinine, Ser 08/06/2020 1.34* 0.40 - 1.20 mg/dL Final   Total Bilirubin 08/06/2020 0.6  0.2 - 1.2 mg/dL Final   Alkaline Phosphatase 08/06/2020 73  39 - 117 U/L Final   AST 08/06/2020 19  0 - 37 U/L Final   ALT 08/06/2020 16  0 - 35 U/L Final   Total Protein 08/06/2020 7.3  6.0 - 8.3 g/dL Final   Albumin 08/06/2020 3.9  3.5 - 5.2 g/dL Final   GFR 08/06/2020 37.70* >60.00 mL/min Final   Calculated using the CKD-EPI Creatinine Equation (2021)   Calcium 08/06/2020 9.4  8.4 - 10.5 mg/dL Final   Cholesterol 08/06/2020 144  0 - 200 mg/dL Final   ATP III Classification       Desirable:  < 200 mg/dL               Borderline High:  200 - 239 mg/dL          High:  > = 240 mg/dL    Triglycerides 08/06/2020 249.0* 0.0 - 149.0 mg/dL Final   Normal:  <150 mg/dLBorderline High:  150 - 199 mg/dL   HDL 08/06/2020 29.00* >39.00 mg/dL Final   VLDL 08/06/2020 49.8* 0.0 - 40.0 mg/dL Final   Total CHOL/HDL Ratio 08/06/2020 5   Final                  Men          Women1/2 Average Risk     3.4          3.3Average Risk          5.0          4.42X Average Risk          9.6          7.13X Average Risk          15.0          11.0                       NonHDL 08/06/2020 115.10   Final   NOTE:  Non-HDL goal should be 30 mg/dL higher than patient's LDL goal (i.e. LDL goal of < 70 mg/dL, would have non-HDL goal of < 100 mg/dL)   Free T4 08/06/2020 1.34  0.60 - 1.60 ng/dL Final   Comment: Specimens from patients who are undergoing biotin therapy and /or ingesting biotin supplements may contain high levels of biotin.  The higher biotin concentration in these specimens interferes with this Free T4 assay.  Specimens that contain high levels  of biotin may cause false high results for this Free T4 assay.  Please interpret results in light of the total clinical presentation of the patient.     TSH 08/06/2020 1.07  0.35 - 4.50 uIU/mL Final   Direct LDL 08/06/2020 81.0  mg/dL Final   Optimal:  <100 mg/dLNear or Above Optimal:  100-129 mg/dLBorderline High:  130-159 mg/dLHigh:  160-189 mg/dLVery High:  >190 mg/dL     Allergies as of 08/08/2020      Reactions   Pneumococcal Vaccine Anaphylaxis   weakness   Pneumovax 23 [pneumococcal Vac Polyvalent] Anaphylaxis   weakness   Other Hives   Other reaction(s): Other (See Comments) Pneumonia  Vaccine- very ill   Sulfa Antibiotics Hives  Accurate as of August 08, 2020 11:23 AM. If you have any questions, ask your nurse or doctor.  °  °  °  °acetaminophen 325 MG tablet °Commonly known as: TYLENOL °Take by mouth every 6 (six) hours as needed for mild pain or moderate pain. °  °acyclovir 400 MG tablet °Commonly known  as: ZOVIRAX °Take 400 mg by mouth 2 (two) times daily. °  °ALPRAZolam 0.25 MG tablet °Commonly known as: XANAX °Take 0.25 mg by mouth at bedtime as needed for anxiety. °  °amLODipine 5 MG tablet °Commonly known as: NORVASC °TAKE 1 TABLET(5 MG) BY MOUTH DAILY °  °atorvastatin 40 MG tablet °Commonly known as: LIPITOR °TAKE 1 TABLET BY MOUTH DAILY AT 6PM °  °BD Disp Needles 30G X 1/2" Misc °Generic drug: NEEDLE (DISP) 30 G °1 each by Does not apply route 2 (two) times daily before a meal. °  °CALCIUM PO °Take 1 tablet by mouth daily. 600 mg °  °Cholecalciferol 50 MCG (2000 UT) Tabs °Take 4,000 Units by mouth daily. °  °Colcrys 0.6 MG tablet °Generic drug: colchicine °Take 0.6 mg by mouth daily as needed (gout). °  °Eliquis 5 MG Tabs tablet °Generic drug: apixaban °TAKE 1 TABLET(5 MG) BY MOUTH TWICE DAILY °  °FreeStyle Freedom Lite w/Device Kit °1 each by Does not apply route in the morning and at bedtime. Use Freestyle lite meter to check blood sugar twice daily. °  °FREESTYLE LITE test strip °Generic drug: glucose blood °Use to test blood sugar twice daily. Dx: E11.9 °  °gabapentin 100 MG capsule °Commonly known as: NEURONTIN °Take 100-200 mg by mouth at bedtime. °  °hydrochlorothiazide 25 MG tablet °Commonly known as: HYDRODIURIL °Take 1 tablet (25 mg total) by mouth daily. TAKE 1 TABLET(25 MG) BY MOUTH DAILY. °  °hydrocortisone cream 1 % °Apply 1 application topically daily as needed for itching. °  °levothyroxine 150 MCG tablet °Commonly known as: SYNTHROID °Take 1 tablet (150 mcg total) by mouth daily. °  °magnesium 30 MG tablet °Take 30 mg by mouth at bedtime. °  °metFORMIN 500 MG tablet °Commonly known as: GLUCOPHAGE °TAKE 1 TABLET BY MOUTH EVERY MORNING AND 2 TABLETS EVERY EVENING °  °metoprolol succinate 50 MG 24 hr tablet °Commonly known as: TOPROL-XL °Take (1 tablet) 50 mg every morning. Take with or immediately following a meal. To be taken in addition to 25 mg tablet every evening. °  °metoprolol succinate  25 MG 24 hr tablet °Commonly known as: TOPROL-XL °Take 1 tablet (25 mg total) by mouth at bedtime. °  °NOVOLIN N FLEXPEN [place] °Inject 30 Units into the skin daily. 15 units at morning and 18 units at night °What changed: Another medication with the same name was changed. Make sure you understand how and when to take each. °  °NovoLIN N FlexPen ReliOn 100 UNIT/ML Kiwkpen °Generic drug: Insulin NPH (Human) ([place]oph[place]) °INJECT 25 UNITS INTO THE SKIN TWICE DAILY °What changed: See the new instructions. °  °OneTouch Delica Lancets 33G Misc °Use to check blood sugars once daily °  °Ozempic (1 MG/DOSE) 2 MG/1.5ML Sopn °Generic drug: Semaglutide (1 MG/DOSE) °Inject 1 mg into the skin once a week. °  °Ozempic (0.25 or 0.5 MG/DOSE) 2 MG/1.5ML Sopn °Generic drug: Semaglutide(0.25 or 0.5MG/DOS) °Inject 0.5 mg into the skin once a week. °  °pioglitazone 15 MG tablet °Commonly known as: ACTOS °TAKE 1 TABLET(15 MG) BY MOUTH DAILY °  °prednisoLONE acetate 1 % ophthalmic suspension °Commonly known as: PRED FORTE °Place   ophthalmic suspension Commonly known as: PRED FORTE Place 2 drops into the right eye daily.   timolol 0.5 % ophthalmic solution Commonly known as: TIMOPTIC Place 1 drop into both eyes daily.   vitamin B-12 1000 MCG tablet Commonly known as: CYANOCOBALAMIN Take 1,000 mcg by mouth daily.   vitamin C 500 MG tablet Commonly known as: ASCORBIC ACID Take 500 mg by mouth daily.       Allergies:  Allergies  Allergen Reactions   Pneumococcal Vaccine Anaphylaxis    weakness   Pneumovax 23 [Pneumococcal Vac Polyvalent] Anaphylaxis    weakness   Other Hives    Other reaction(s): Other (See Comments) Pneumonia  Vaccine- very ill   Sulfa Antibiotics Hives    Past Medical History:  Diagnosis Date   Anemia    s/p Heme work -up normal EGD and colonscopy in 2012 per pt,neg SPEP   Anxiety    Aortic stenosis    mild by echo 07/2019   Bilateral carotid artery stenosis 06/08/2015   1-39% bilateral   Bradycardia    CKD (chronic kidney disease), stage  III ([place])    Diabetes mellitus without complication (HCC)    type 2   GERD (gastroesophageal reflux disease)    Gout    HSV-1 (herpes simplex virus 1) infection    Acyclovir prn   Hyperkalemia    Hyperlipidemia    Hypertension    Joint pain    osteoarthritis by Xray- possible erosion of R 4th MCP,elevated uric  acid ,ANA +    Morbid obesity (HCC)    OSA on CPAP    Osteopenia    PAF (paroxysmal atrial fibrillation) ([place])    Pain management    Neurosurg: Dr Clydell Hakim   Pulmonary HTN ([place])    Moderate by echo  2016. PASP 49mHg   TIA (transient ischemic attack)    remote history of TIA's early 2000    Past Surgical History:  Procedure Laterality Date   ABDOMINAL HYSTERECTOMY     APPENDECTOMY     BACK SURGERY     cervical fusion   BLADDER SURGERY     CESAREAN SECTION     CHOLECYSTECTOMY      Family History  Problem Relation Age of Onset   Arrhythmia Brother     Social History:  reports that she has never smoked. She has never used smokeless tobacco. She reports that she does not drink alcohol and does not use drugs.  Review of Systems:   Hypertension:   This has been managed by her PCP and nephrologist She is taking amlodipine 5 mg, metoprolol 25 mg and HCTZ 25 mg  BP Readings from Last 3 Encounters:  08/08/20 (!) 140/58  07/22/20 (!) 128/57  06/02/20 124/76   Lab Results  Component Value Date   CREATININE 1.34 (H) 08/06/2020   CREATININE 1.31 (H) 05/27/2020   CREATININE 1.14 02/25/2020     Lipids: Triglycerides have been mostly high LDL has been below 100 with Lipitor 40 mg prescribed by her cardiologist She is also on 15 mg of Actos Last labs were again done nonfasting  Lab Results  Component Value Date   CHOL 144 08/06/2020   HDL 29.00 (L) 08/06/2020   LDLCALC 50 01/10/2018   LDLDIRECT 81.0 08/06/2020   TRIG 249.0 (H) 08/06/2020   CHOLHDL 5 08/06/2020    Renal insufficiency: Her creatinine levels are as  follows:  Has seen nephrologist before Lisinopril caused hyperkalemia previously  Lab Results  Component Value Date  CREATININE 1.34 (H) 08/06/2020   CREATININE 1.31 (H) 05/27/2020   CREATININE 1.14 02/25/2020   Lab Results  Component Value Date   K 3.9 08/06/2020     She has hypothyroidism and is taking 150 mcg daily The dose was increased in 9/21 when she was taking 137 mcg  She is regular with taking her levothyroxine before breakfast and has not missed any doses  Feels less tired recently Last TSH as follows  Lab Results  Component Value Date   TSH 1.07 08/06/2020   TSH 9.98 (H) 05/27/2020   TSH 3.32 08/16/2019   FREET4 1.34 08/06/2020   FREET4 1.01 05/27/2020   FREET4 1.15 12/13/2018     She has been on gabapentin 100 mg hs for mild burning and tingling in her feet and toes Followed by podiatrist also regularly   Examination:   BP (!) 140/58 (BP Location: Left Arm, Patient Position: Sitting, Cuff Size: Large)    Pulse (!) 120    Temp 97.9 F (36.6 C) (Oral)    Ht 5' 1.5" (1.562 m)    Wt 231 lb (104.8 kg)    SpO2 98%    BMI 42.94 kg/m   Body mass index is 42.94 kg/m.      ASSESSMENT/ PLAN:   Diabetes type 2 with obesity, insulin requiring:    See history of present illness for detailed discussion of  current management, blood sugar patterns and issues identified  Her A1c was last 7.2 No recent fructosamine available  Taking twice daily NPH and Ozempic 0.5 mg weekly since 2020  She usually does well when she takes her Ozempic regularly However has issues with affording this Also likely had higher readings during the day with previous dose of 15 NPH and now doing better with 18 units Her morning sugars may be occasionally higher likely from occasional sweets at night Again does not check readings after dinner as directed  Recommendations:  More frequent glucose monitoring after meals especially dinner  May need occasional doses of regular  insulin at suppertime based on her carbohydrate intake and especially if eating dessert  She should take her morning insulin at least an hour before her first meal   Increase morning dose to 18  She will continue Ozempic, needs to be switching to the 1 mg pen again and dialing 38 clicks weekly  No change in insulin   LIPIDS: Triglycerides are again high with taking atorvastatin alone, this is prescribed by her cardiologist Needs fasting lipids to assess need for fenofibrate  HYPOTHYROIDISM: TSH is back to normal with 150 mcg levothyroxine instead of 137  Hypertension: Fairly well controlled, she may have some whitecoat increase in blood pressure in the office  RENAL dysfunction: Mild and stable  Follow-up in 3 months   There are no Patient Instructions on file for this visit.   Elayne Snare 08/08/2020, 11:23 AM

## 2020-08-11 ENCOUNTER — Other Ambulatory Visit: Payer: Self-pay | Admitting: *Deleted

## 2020-08-11 NOTE — Patient Outreach (Signed)
  [facility]) Care Management Chronic Special Needs Program    08/11/2020  Name: Tina Hester, DOB: 01/26/1941  MRN: [id number]   Ms. Tina Hester is enrolled in a chronic special needs plan for Diabetes.  [facility] care management will continue to provide services for this member through 08/29/20.  The [facility] care management team will assume care 08/30/20.   Tina Hester [facility], BSN [place], [place]

## 2020-08-25 DIAGNOSIS — G4733 Obstructive sleep apnea (adult) (pediatric): Secondary | ICD-10-CM | POA: Diagnosis not present

## 2020-08-26 DIAGNOSIS — G4733 Obstructive sleep apnea (adult) (pediatric): Secondary | ICD-10-CM | POA: Diagnosis not present

## 2020-09-03 ENCOUNTER — Other Ambulatory Visit: Payer: Self-pay | Admitting: *Deleted

## 2020-09-06 ENCOUNTER — Other Ambulatory Visit: Payer: Self-pay | Admitting: Endocrinology

## 2020-09-19 ENCOUNTER — Telehealth (HOSPITAL_COMMUNITY): Payer: Self-pay

## 2020-09-19 ENCOUNTER — Other Ambulatory Visit: Payer: Self-pay | Admitting: Physician Assistant

## 2020-09-19 ENCOUNTER — Ambulatory Visit: Payer: HMO | Admitting: Podiatry

## 2020-09-19 DIAGNOSIS — E1100 Type 2 diabetes mellitus with hyperosmolarity without nonketotic hyperglycemic-hyperosmolar coma (NKHHC): Secondary | ICD-10-CM

## 2020-09-19 DIAGNOSIS — N183 Chronic kidney disease, stage 3 unspecified: Secondary | ICD-10-CM

## 2020-09-19 DIAGNOSIS — I4819 Other persistent atrial fibrillation: Secondary | ICD-10-CM

## 2020-09-19 DIAGNOSIS — G4733 Obstructive sleep apnea (adult) (pediatric): Secondary | ICD-10-CM

## 2020-09-19 DIAGNOSIS — I10 Essential (primary) hypertension: Secondary | ICD-10-CM

## 2020-09-19 DIAGNOSIS — U071 COVID-19: Secondary | ICD-10-CM

## 2020-09-19 MED ORDER — MOLNUPIRAVIR EUA 200MG CAPSULE: 4.0000 | ORAL_CAPSULE | Freq: Two times a day (BID) | ORAL | 0 refills | Status: AC

## 2020-09-19 MED FILL — MOLNUPIRAVIR 200 MG CAPS: 200 | 5 days supply | Qty: 40 | Fill #0

## 2020-09-19 NOTE — Telephone Encounter (Signed)
Patient was prescribed oral covid treatment Molnupiravir and treatment note was reviewed. Medication has been received by [place] and reviewed for appropriateness.  Drug Interactions or Dosage Adjustments Noted: none  Delivery Method: pick up  Patient contacted for counseling on 09/19/20 and verbalized understanding.   Delivery or Pick-Up Date: 09/19/20   Alinda Dooms 09/19/2020, 9:58 AM [facility] Outpatient Pharmacist Phone# [phone]

## 2020-09-19 NOTE — Progress Notes (Signed)
Outpatient Oral COVID Treatment Note  I connected with [name] on 09/19/2020/9:29 AM by telephone and verified that I am speaking with the correct person using two identifiers.  I discussed the limitations, risks, security, and privacy concerns of performing an evaluation and management service by telephone and the availability of in person appointments. I also discussed with the patient that there may be a patient responsible charge related to this service. The patient expressed understanding and agreed to proceed.  Patient location: Home Provider location: Home  Diagnosis: COVID-19 infection  Purpose of visit: Discussion of potential use of Molnupiravir or Paxlovid, a new treatment for mild to moderate COVID-19 viral infection in non-hospitalized patients.   Subjective: Patient is a 80 y.o. female who has been diagnosed with COVID 19 viral infection.  Their symptoms began on 09/17/20 with cough and congestion.     Past Medical History:  Diagnosis Date  . Anemia    s/p Heme work -up normal EGD and colonscopy in 2012 per pt,neg SPEP  . Anxiety   . Aortic stenosis    mild by echo 07/2019  . Bilateral carotid artery stenosis 06/08/2015   1-39% bilateral  . Bradycardia   . CKD (chronic kidney disease), stage III ([place])   . Diabetes mellitus without complication ([place])    type 2  . GERD (gastroesophageal reflux disease)   . Gout   . HSV-1 (herpes simplex virus 1) infection    Acyclovir prn  . Hyperkalemia   . Hyperlipidemia   . Hypertension   . Joint pain    osteoarthritis by Xray- possible erosion of R 4th MCP,elevated uric  acid ,ANA +   . Morbid obesity ([place])   . OSA on CPAP   . Osteopenia   . PAF (paroxysmal atrial fibrillation) ([place])   . Pain management    Neurosurg: Dr Clydell Hakim  . Pulmonary HTN ([place])    Moderate by echo  2016. PASP 87mHg  . TIA (transient ischemic attack)    remote history of TIA's early 2000    Allergies  Allergen Reactions  .  Pneumococcal Vaccine Anaphylaxis    weakness  . Pneumovax 23 [Pneumococcal Vac Polyvalent] Anaphylaxis    weakness  . Other Hives    Other reaction(s): Other (See Comments) Pneumonia  Vaccine- very ill  . Sulfa Antibiotics Hives     Current Outpatient Medications:  .  acetaminophen (TYLENOL) 325 MG tablet, Take by mouth every 6 (six) hours as needed for mild pain or moderate pain., Disp: , Rfl:  .  acyclovir (ZOVIRAX) 400 MG tablet, Take 400 mg by mouth 2 (two) times daily. , Disp: , Rfl:  .  ALPRAZolam (XANAX) 0.25 MG tablet, Take 0.25 mg by mouth at bedtime as needed for anxiety., Disp: , Rfl:  .  amLODipine (NORVASC) 5 MG tablet, TAKE 1 TABLET(5 MG) BY MOUTH DAILY, Disp: 90 tablet, Rfl: 3 .  atorvastatin (LIPITOR) 40 MG tablet, TAKE 1 TABLET BY MOUTH DAILY AT 6PM, Disp: 90 tablet, Rfl: 1 .  Blood Glucose Monitoring Suppl (FREESTYLE FREEDOM LITE) w/Device KIT, 1 each by Does not apply route in the morning and at bedtime. Use Freestyle lite meter to check blood sugar twice daily., Disp: 1 kit, Rfl: 1 .  CALCIUM PO, Take 1 tablet by mouth daily. 600 mg, Disp: , Rfl:  .  Cholecalciferol 2000 units TABS, Take 4,000 Units by mouth daily., Disp: , Rfl:  .  COLCRYS 0.6 MG tablet, Take 0.6 mg by mouth daily  as needed (gout). , Disp: , Rfl:  .  ELIQUIS 5 MG TABS tablet, TAKE 1 TABLET(5 MG) BY MOUTH TWICE DAILY, Disp: 180 tablet, Rfl: 1 .  gabapentin (NEURONTIN) 100 MG capsule, Take 100-200 mg by mouth at bedtime. , Disp: , Rfl:  .  glucose blood (FREESTYLE LITE) test strip, Use to test blood sugar twice daily. Dx: E11.9, Disp: 200 each, Rfl: 1 .  hydrochlorothiazide (HYDRODIURIL) 25 MG tablet, Take 1 tablet (25 mg total) by mouth daily. TAKE 1 TABLET(25 MG) BY MOUTH DAILY., Disp: 90 tablet, Rfl: 3 .  hydrocortisone cream 1 %, Apply 1 application topically daily as needed for itching., Disp: , Rfl:  .  Insulin NPH Human, Isophane, (NOVOLIN N FLEXPEN [place]), Inject 30 Units into the skin daily. 15  units at morning and 18 units at night, Disp: , Rfl:  .  Insulin NPH, Human,, Isophane, (NOVOLIN N FLEXPEN RELION) 100 UNIT/ML Kiwkpen, Inject 18 Units into the skin 3 (three) times daily. Take 18 units in am and 15 units in pm, Disp: 15 mL, Rfl: 5 .  levothyroxine (SYNTHROID) 150 MCG tablet, Take 1 tablet (150 mcg total) by mouth daily., Disp: 90 tablet, Rfl: 3 .  magnesium 30 MG tablet, Take 30 mg by mouth at bedtime. , Disp: , Rfl:  .  metFORMIN (GLUCOPHAGE) 500 MG tablet, TAKE 1 TABLET BY MOUTH EVERY MORNING AND 2 TABLETS EVERY EVENING, Disp: 270 tablet, Rfl: 3 .  metoprolol succinate (TOPROL-XL) 25 MG 24 hr tablet, Take 1 tablet (25 mg total) by mouth at bedtime., Disp: 90 tablet, Rfl: 3 .  metoprolol succinate (TOPROL-XL) 50 MG 24 hr tablet, Take (1 tablet) 50 mg every morning. Take with or immediately following a meal. To be taken in addition to 25 mg tablet every evening., Disp: 90 tablet, Rfl: 3 .  NEEDLE, DISP, 30 G (BD DISP NEEDLES) 30G X 1/2" MISC, 1 each by Does not apply route 2 (two) times daily before a meal., Disp: 100 each, Rfl: 5 .  ONETOUCH DELICA LANCETS 32G MISC, Use to check blood sugars once daily, Disp: 50 each, Rfl: 3 .  pioglitazone (ACTOS) 15 MG tablet, TAKE 1 TABLET(15 MG) BY MOUTH DAILY, Disp: 90 tablet, Rfl: 1 .  prednisoLONE acetate (PRED FORTE) 1 % ophthalmic suspension, Place 2 drops into the right eye daily. , Disp: , Rfl: 2 .  Semaglutide, 1 MG/DOSE, (OZEMPIC, 1 MG/DOSE,) 2 MG/1.5ML SOPN, Inject 1 mg into the skin once a week., Disp: 1.5 mL, Rfl: 2 .  timolol (TIMOPTIC) 0.5 % ophthalmic solution, Place 1 drop into both eyes daily. , Disp: , Rfl: 1 .  vitamin B-12 (CYANOCOBALAMIN) 1000 MCG tablet, Take 1,000 mcg by mouth daily., Disp: , Rfl:  .  vitamin C (ASCORBIC ACID) 500 MG tablet, Take 500 mg by mouth daily., Disp: , Rfl:   Objective: Patient appears/sounds mild congested.  They are in no apparent distress.  Breathing is non labored.  Mood and behavior are  normal.  Laboratory Data:  Recent Results (from the past 2160 hour(s))  Comprehensive metabolic panel     Status: Abnormal   Collection Time: 08/06/20  3:24 PM  Result Value Ref Range   Sodium 139 135 - 145 mEq/L   Potassium 3.9 3.5 - 5.1 mEq/L   Chloride 100 96 - 112 mEq/L   CO2 31 19 - 32 mEq/L   Glucose, Bld 103 (H) 70 - 99 mg/dL   BUN 21 6 - 23 mg/dL   Creatinine,  Ser 1.34 (H) 0.40 - 1.20 mg/dL   Total Bilirubin 0.6 0.2 - 1.2 mg/dL   Alkaline Phosphatase 73 39 - 117 U/L   AST 19 0 - 37 U/L   ALT 16 0 - 35 U/L   Total Protein 7.3 6.0 - 8.3 g/dL   Albumin 3.9 3.5 - 5.2 g/dL   GFR 37.70 (L) >60.00 mL/min    Comment: Calculated using the CKD-EPI Creatinine Equation (2021)   Calcium 9.4 8.4 - 10.5 mg/dL  Lipid panel     Status: Abnormal   Collection Time: 08/06/20  3:24 PM  Result Value Ref Range   Cholesterol 144 0 - 200 mg/dL    Comment: ATP III Classification       Desirable:  < 200 mg/dL               Borderline High:  200 - 239 mg/dL          High:  > = 240 mg/dL   Triglycerides 249.0 (H) 0.0 - 149.0 mg/dL    Comment: Normal:  <150 mg/dLBorderline High:  150 - 199 mg/dL   HDL 29.00 (L) >39.00 mg/dL   VLDL 49.8 (H) 0.0 - 40.0 mg/dL   Total CHOL/HDL Ratio 5     Comment:                Men          Women1/2 Average Risk     3.4          3.3Average Risk          5.0          4.42X Average Risk          9.6          7.13X Average Risk          15.0          11.0                       NonHDL 115.10     Comment: NOTE:  Non-HDL goal should be 30 mg/dL higher than patient's LDL goal (i.e. LDL goal of < 70 mg/dL, would have non-HDL goal of < 100 mg/dL)  T4, free     Status: None   Collection Time: 08/06/20  3:24 PM  Result Value Ref Range   Free T4 1.34 0.60 - 1.60 ng/dL    Comment: Specimens from patients who are undergoing biotin therapy and /or ingesting biotin supplements may contain high levels of biotin.  The higher biotin concentration in these specimens interferes with this  Free T4 assay.  Specimens that contain high levels  of biotin may cause false high results for this Free T4 assay.  Please interpret results in light of the total clinical presentation of the patient.    TSH     Status: None   Collection Time: 08/06/20  3:24 PM  Result Value Ref Range   TSH 1.07 0.35 - 4.50 uIU/mL  LDL cholesterol, direct     Status: None   Collection Time: 08/06/20  3:24 PM  Result Value Ref Range   Direct LDL 81.0 mg/dL    Comment: Optimal:  <100 mg/dLNear or Above Optimal:  100-129 mg/dLBorderline High:  130-159 mg/dLHigh:  160-189 mg/dLVery High:  >190 mg/dL     Assessment: 80 y.o. female with mild/moderate COVID 19 viral infection diagnosed on 09/18/20 at high risk for progression to severe COVID 19.  Plan:  This  patient is a 80 y.o. female that meets the following criteria for Emergency Use Authorization of: Molnupiravir  1. Age >18 yr 2. SARS-COV-2 positive test 3. Symptom onset < 5 days 4. Mild-to-moderate COVID disease with high risk for severe progression to hospitalization or death   I have spoken and communicated the following to the patient or parent/caregiver regarding: 1. Molnupiravir is an unapproved drug that is authorized for use under an [employer].  2. There are no adequate, approved, available products for the treatment of COVID-19 in adults who have mild-to-moderate COVID-19 and are at high risk for progressing to severe COVID-19, including hospitalization or death. 3. Other therapeutics are currently authorized. For additional information on all products authorized for treatment or prevention of COVID-19, please see [email or web address].  4. There are benefits and risks of taking this treatment as outlined in the "Fact Sheet for Patients and Caregivers."  5. "Fact Sheet for Patients and Caregivers" was reviewed with patient. A hard  copy will be provided to patient from pharmacy prior to the patient receiving treatment. 6. Patients should continue to self-isolate and use infection control measures (e.g., wear mask, isolate, social distance, avoid sharing personal items, clean and disinfect "high touch" surfaces, and frequent handwashing) according to CDC guidelines.  7. The patient or parent/caregiver has the option to accept or refuse treatment. 8. [place] has established a pregnancy surveillance program. 9. Females of childbearing potential should use a reliable method of contraception correctly and consistently, as applicable, for the duration of treatment and for 4 days after the last dose of Molnupiravir. 73. Males of reproductive potential who are sexually active with females of childbearing potential should use a reliable method of contraception correctly and consistently during treatment and for at least 3 months after the last dose. 11. Pregnancy status and risk was assessed. Patient verbalized understanding of precautions.   After reviewing above information with the patient, the patient agrees to receive molnupiravir.  Follow up instructions:    . Take prescription BID x 5 days as directed . Reach out to pharmacist for counseling on medication if desired . For concerns regarding further COVID symptoms please follow up with your PCP or urgent care . For urgent or life-threatening issues, seek care at your local emergency department  The patient was provided an opportunity to ask questions, and all were answered. The patient agreed with the plan and demonstrated an understanding of the instructions.   Script sent to [facility] and opted to pick up RX.  The patient was advised to call their PCP or seek an in-person evaluation if the symptoms worsen or if the condition fails to improve as anticipated.   I provided 45 minutes of non face-to-face telephone visit time during this  encounter, and > 50% was spent counseling as documented under my assessment & plan.  [place], [place] 09/19/2020 /9:29 AM

## 2020-09-25 DIAGNOSIS — G4733 Obstructive sleep apnea (adult) (pediatric): Secondary | ICD-10-CM | POA: Diagnosis not present

## 2020-09-26 DIAGNOSIS — N3000 Acute cystitis without hematuria: Secondary | ICD-10-CM | POA: Diagnosis not present

## 2020-09-27 ENCOUNTER — Other Ambulatory Visit: Payer: Self-pay | Admitting: Cardiology

## 2020-09-29 NOTE — Telephone Encounter (Signed)
Pt last saw Dr Radford Pax 07/22/20 video visit Covid-19, last labs 08/07/20 Creat 1.34, age 80, weight 104.8kg, based on specified criteria pt is on appropriate dosage of Eliquis 5mg  BID for afib.  Will refill rx.

## 2020-10-03 ENCOUNTER — Other Ambulatory Visit: Payer: Self-pay | Admitting: Endocrinology

## 2020-10-09 ENCOUNTER — Telehealth: Payer: Self-pay

## 2020-10-09 NOTE — Telephone Encounter (Signed)
Spoke with the patient who states that she has been in a lot of back pain. She is probably going to need injections and is wanting to know about being off of Eliquis. I advised her to speak with the provider who will be giving her the injections and have them call us.\Patient verbalized understanding.

## 2020-10-10 DIAGNOSIS — M5431 Sciatica, right side: Secondary | ICD-10-CM | POA: Diagnosis not present

## 2020-10-15 DIAGNOSIS — M5416 Radiculopathy, lumbar region: Secondary | ICD-10-CM | POA: Diagnosis not present

## 2020-10-21 DIAGNOSIS — G4733 Obstructive sleep apnea (adult) (pediatric): Secondary | ICD-10-CM | POA: Diagnosis not present

## 2020-10-26 DIAGNOSIS — G4733 Obstructive sleep apnea (adult) (pediatric): Secondary | ICD-10-CM | POA: Diagnosis not present

## 2020-10-28 DIAGNOSIS — M5416 Radiculopathy, lumbar region: Secondary | ICD-10-CM | POA: Diagnosis not present

## 2020-11-05 DIAGNOSIS — I700 Atherosclerosis of aorta: Secondary | ICD-10-CM | POA: Diagnosis not present

## 2020-11-05 DIAGNOSIS — D6869 Other thrombophilia: Secondary | ICD-10-CM | POA: Diagnosis not present

## 2020-11-05 DIAGNOSIS — M5416 Radiculopathy, lumbar region: Secondary | ICD-10-CM | POA: Insufficient documentation

## 2020-11-05 DIAGNOSIS — I4891 Unspecified atrial fibrillation: Secondary | ICD-10-CM | POA: Diagnosis not present

## 2020-11-07 ENCOUNTER — Other Ambulatory Visit: Payer: Self-pay | Admitting: Cardiology

## 2020-11-11 ENCOUNTER — Other Ambulatory Visit: Payer: HMO

## 2020-11-14 ENCOUNTER — Ambulatory Visit: Payer: HMO | Admitting: Endocrinology

## 2020-11-22 DIAGNOSIS — M5416 Radiculopathy, lumbar region: Secondary | ICD-10-CM | POA: Diagnosis not present

## 2020-11-23 DIAGNOSIS — G4733 Obstructive sleep apnea (adult) (pediatric): Secondary | ICD-10-CM | POA: Diagnosis not present

## 2020-11-24 DIAGNOSIS — G4733 Obstructive sleep apnea (adult) (pediatric): Secondary | ICD-10-CM | POA: Diagnosis not present

## 2020-11-25 ENCOUNTER — Ambulatory Visit: Payer: HMO | Admitting: Podiatry

## 2020-11-26 DIAGNOSIS — N183 Chronic kidney disease, stage 3 unspecified: Secondary | ICD-10-CM | POA: Diagnosis not present

## 2020-11-26 DIAGNOSIS — F330 Major depressive disorder, recurrent, mild: Secondary | ICD-10-CM | POA: Diagnosis not present

## 2020-11-26 DIAGNOSIS — E1121 Type 2 diabetes mellitus with diabetic nephropathy: Secondary | ICD-10-CM | POA: Diagnosis not present

## 2020-11-26 DIAGNOSIS — M5416 Radiculopathy, lumbar region: Secondary | ICD-10-CM | POA: Diagnosis not present

## 2020-12-18 DIAGNOSIS — M5416 Radiculopathy, lumbar region: Secondary | ICD-10-CM | POA: Diagnosis not present

## 2020-12-18 DIAGNOSIS — I10 Essential (primary) hypertension: Secondary | ICD-10-CM | POA: Diagnosis not present

## 2020-12-18 DIAGNOSIS — Z6841 Body Mass Index (BMI) 40.0 and over, adult: Secondary | ICD-10-CM | POA: Diagnosis not present

## 2020-12-19 ENCOUNTER — Other Ambulatory Visit: Payer: Self-pay | Admitting: Neurological Surgery

## 2020-12-19 DIAGNOSIS — M5416 Radiculopathy, lumbar region: Secondary | ICD-10-CM

## 2020-12-24 ENCOUNTER — Ambulatory Visit
Admission: RE | Admit: 2020-12-24 | Discharge: 2020-12-24 | Disposition: A | Discharge status: Home / Self Care | Payer: HMO | Source: Ambulatory Visit | Attending: Neurological Surgery | Admitting: Neurological Surgery

## 2020-12-24 ENCOUNTER — Other Ambulatory Visit: Payer: Self-pay

## 2020-12-24 DIAGNOSIS — M545 Low back pain, unspecified: Secondary | ICD-10-CM | POA: Diagnosis not present

## 2020-12-24 DIAGNOSIS — M48061 Spinal stenosis, lumbar region without neurogenic claudication: Secondary | ICD-10-CM | POA: Diagnosis not present

## 2020-12-24 DIAGNOSIS — G4733 Obstructive sleep apnea (adult) (pediatric): Secondary | ICD-10-CM | POA: Diagnosis not present

## 2020-12-24 DIAGNOSIS — M5416 Radiculopathy, lumbar region: Secondary | ICD-10-CM

## 2020-12-24 IMAGING — MR MR LUMBAR SPINE W/O CM
4 of 5 series · 18 of 48 positions shown · non-contrast
Comparison: Lumbar MRI [DATE]

CLINICAL DATA: Low back pain with right hip and leg pain

EXAM:
MRI LUMBAR SPINE WITHOUT CONTRAST
TECHNIQUE: Multiplanar, multisequence MR imaging of the lumbar spine was
performed. No intravenous contrast was administered.

[Series 5: T2 · sagittal · 4.0mm · 0.73mm/px · 6 of 15 slices shown (1 of 2)]
[im 1/15]
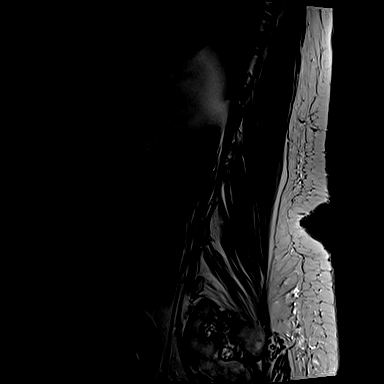
[im 3/15]
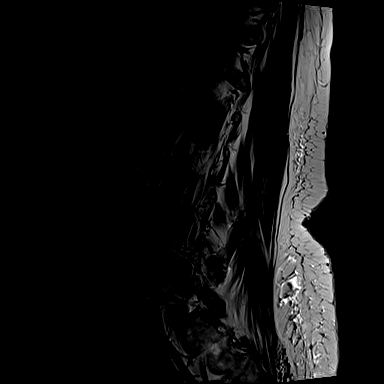
[im 6/15]
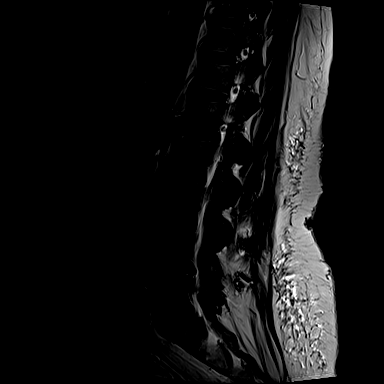
[im 9/15]
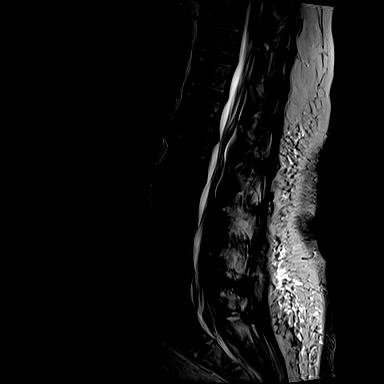
[im 12/15]
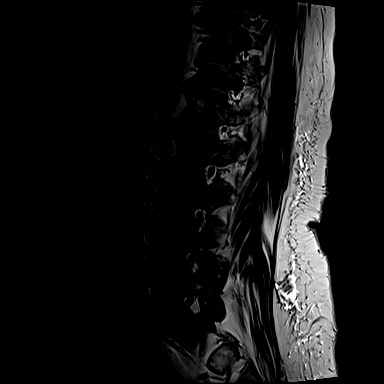
[im 15/15]
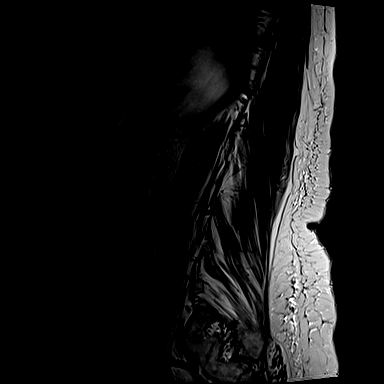

[Series 6: T1 · sagittal · 4.0mm · 0.73mm/px · 3 of 15 slices shown (1 of 2)]
[im 3/15]
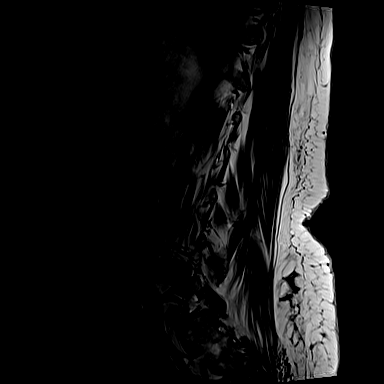
[im 9/15]
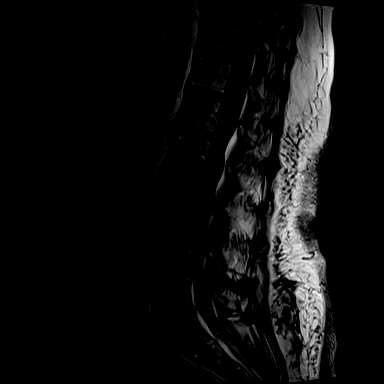
[im 15/15]
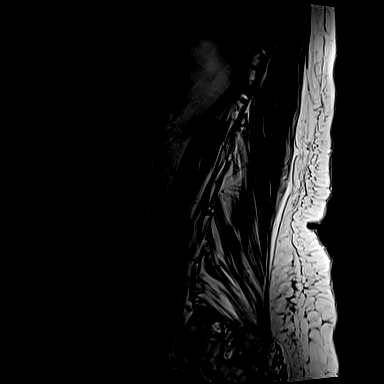

[Series 10: T1 · axial · 4.0mm · 0.28mm/px · z∈[-55,+95]mm · 3 of 37 slices shown (2 of 2)]
[im 6/37]
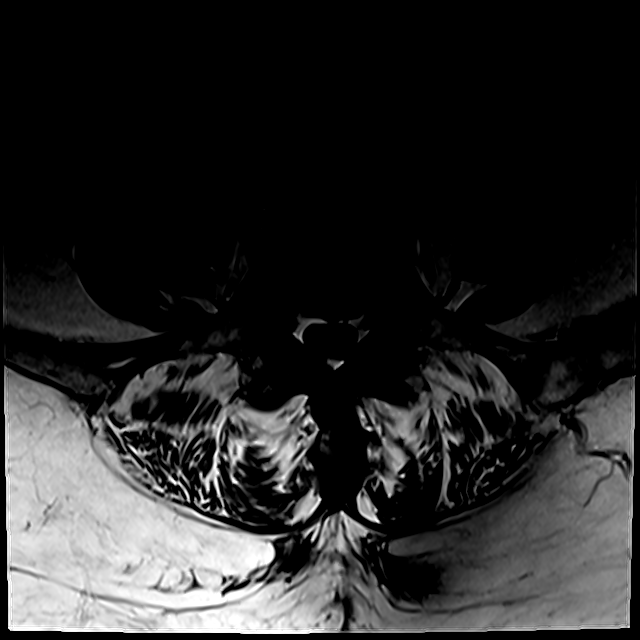
[im 19/37]
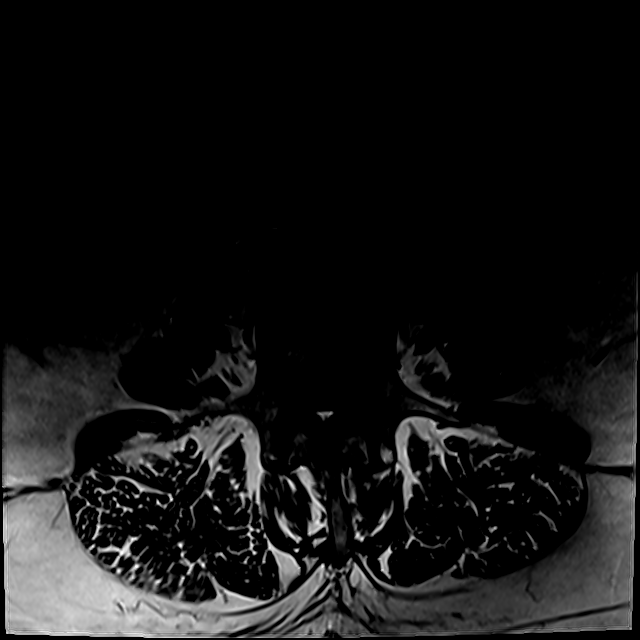
[im 31/37]
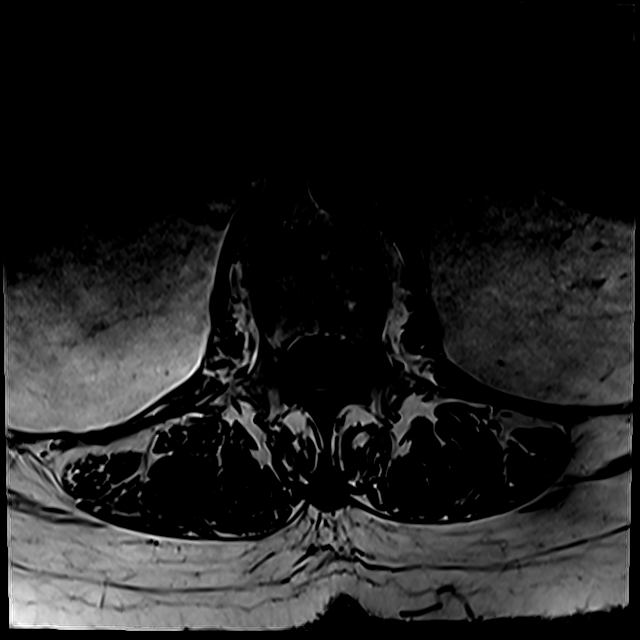

[Series 13: T2 · axial · 4.0mm · 0.28mm/px · z∈[-79,+95]mm · 6 of 37 slices shown (2 of 2)]
[im 1/37]
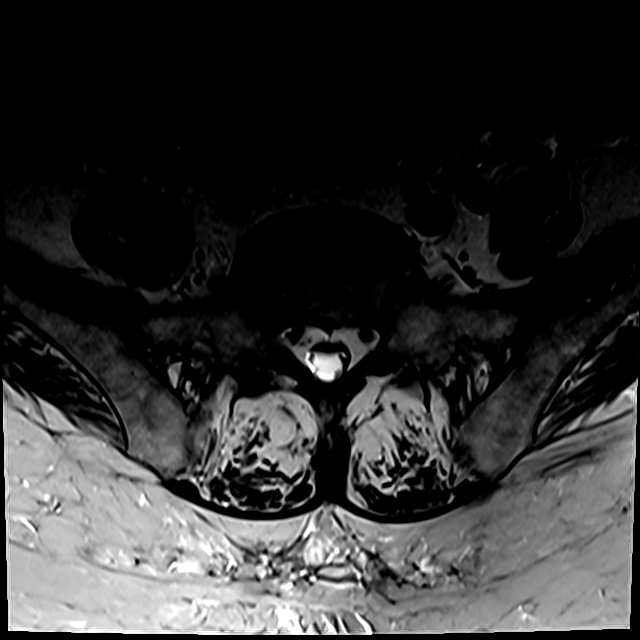
[im 6/37]
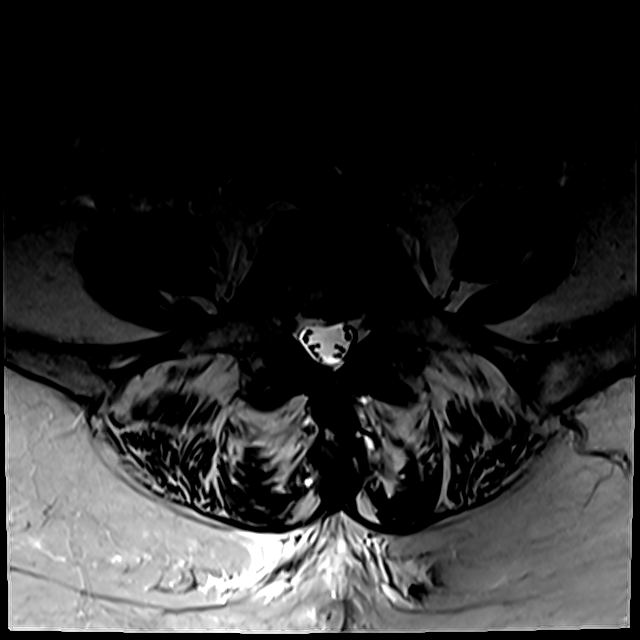
[im 11/37]
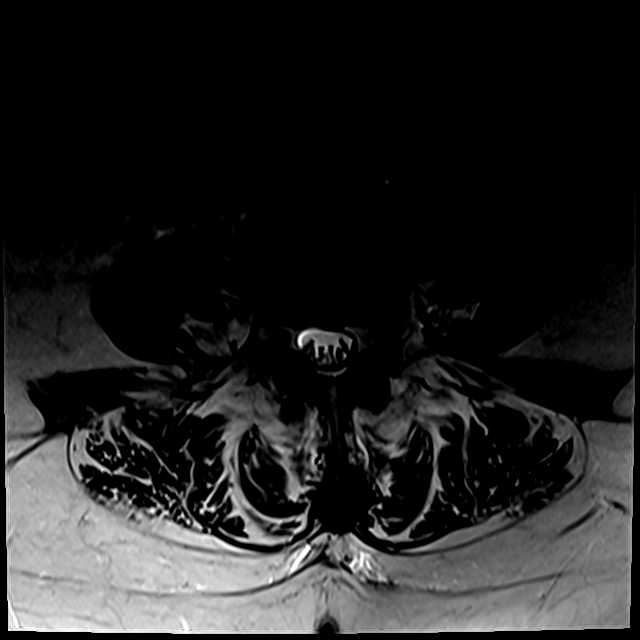
[im 16/37]
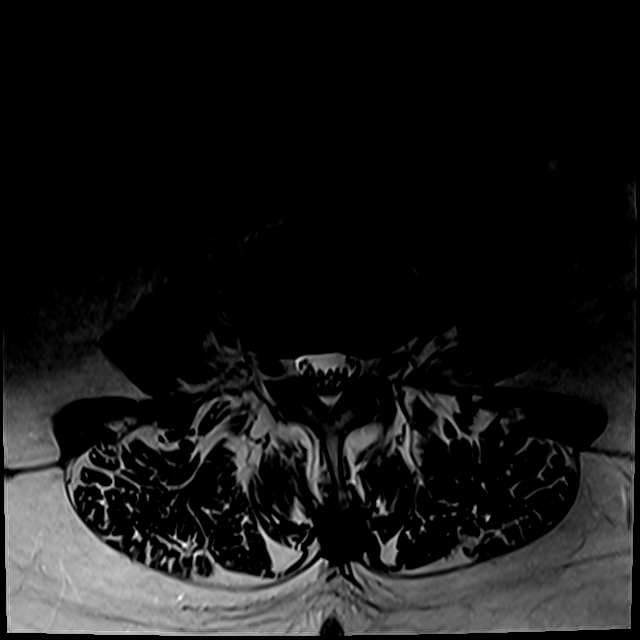
[im 19/37]
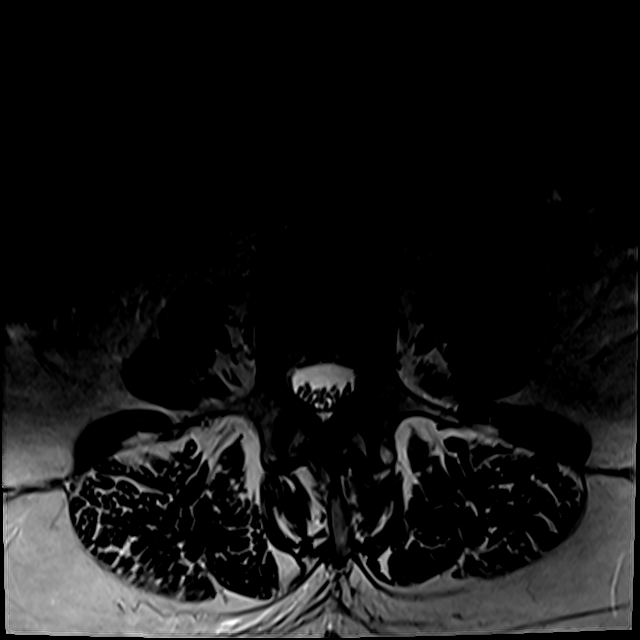
[im 31/37]
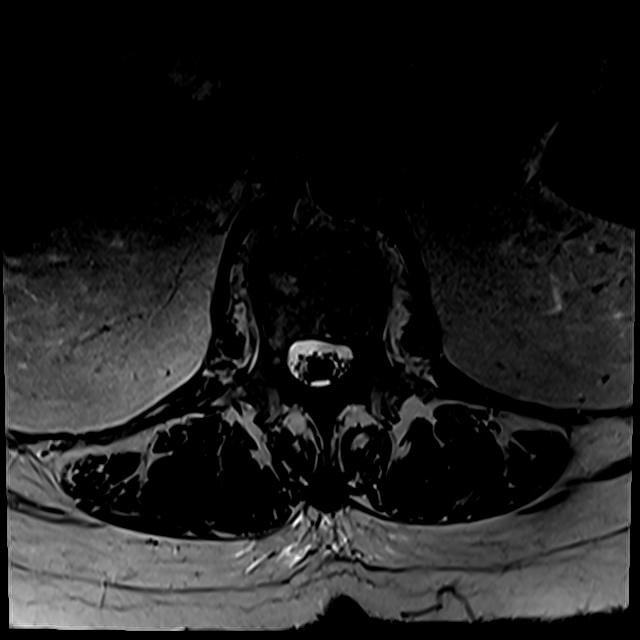

[18 of 48 positions shown; findings below may reference images not displayed]

FINDINGS: Segmentation:  Normal

Alignment: Mild anterolisthesis L4-5 unchanged. Remaining alignment
normal

Vertebrae:  Normal bone marrow.  Negative for fracture or mass.

Conus medullaris and cauda equina: Conus extends to the L2 level.
Conus and cauda equina appear normal.

Paraspinal and other soft tissues: Negative for paraspinous mass or
adenopathy

Common bile duct dilated 12 mm in diameter. Cystic mass near the
uncinate process of the pancreas measuring 2.7 cm. Question
pancreatic mass versus duodenal diverticulum. Further imaging
recommended.

Disc levels:

L1-2: Broad-based disc protrusion extending to the left of midline
causing mild spinal stenosis. Mild facet degeneration.

L2-3: Mild disc bulging and minimal right paracentral disc
protrusion. Mild facet degeneration. No significant spinal or
foraminal stenosis

L3-4: Mild disc bulging and mild facet degeneration. Negative for
stenosis

L4-5: Mild anterolisthesis with diffuse disc bulging. Moderate facet
degeneration. Mild to moderate spinal stenosis. Subarticular
stenosis bilaterally left greater than right

L5-S1: Bilateral facet degeneration.  Negative for stenosis.
IMPRESSION: Multilevel disc and facet degeneration lumbar spine. Mild to
moderate spinal stenosis L4-5 with subarticular stenosis bilaterally
left greater than right.

Dilated common bile duct 12 mm in diameter. Question prior
cholecystectomy. Multi cystic mass near the uncinate process the
pancreas measuring 2.7 cm. Question pancreatic mass versus duodenal
diverticulum. Recommend correlation with LFT and prior imaging if
available. MRI of the pancreas without and with contrast recommended
for further evaluation.

## 2020-12-30 DIAGNOSIS — M5416 Radiculopathy, lumbar region: Secondary | ICD-10-CM | POA: Diagnosis not present

## 2020-12-30 DIAGNOSIS — K8689 Other specified diseases of pancreas: Secondary | ICD-10-CM | POA: Diagnosis not present

## 2020-12-31 ENCOUNTER — Other Ambulatory Visit: Payer: Self-pay | Admitting: Student

## 2020-12-31 DIAGNOSIS — K8689 Other specified diseases of pancreas: Secondary | ICD-10-CM

## 2021-01-05 ENCOUNTER — Ambulatory Visit: Payer: HMO | Admitting: Podiatry

## 2021-01-06 ENCOUNTER — Other Ambulatory Visit: Payer: HMO

## 2021-01-07 ENCOUNTER — Ambulatory Visit
Admission: RE | Admit: 2021-01-07 | Discharge: 2021-01-07 | Disposition: A | Discharge status: Home / Self Care | Payer: HMO | Source: Ambulatory Visit | Attending: Student | Admitting: Student

## 2021-01-07 ENCOUNTER — Other Ambulatory Visit: Payer: Self-pay

## 2021-01-07 DIAGNOSIS — N281 Cyst of kidney, acquired: Secondary | ICD-10-CM | POA: Diagnosis not present

## 2021-01-07 DIAGNOSIS — K8689 Other specified diseases of pancreas: Secondary | ICD-10-CM

## 2021-01-07 DIAGNOSIS — K838 Other specified diseases of biliary tract: Secondary | ICD-10-CM | POA: Diagnosis not present

## 2021-01-07 DIAGNOSIS — K760 Fatty (change of) liver, not elsewhere classified: Secondary | ICD-10-CM | POA: Diagnosis not present

## 2021-01-07 DIAGNOSIS — N271 Small kidney, bilateral: Secondary | ICD-10-CM | POA: Diagnosis not present

## 2021-01-07 IMAGING — MR MR ABDOMEN WO/W CM
13 of 19 series · 31 of 48 positions shown · IV contrast (multihance)
Comparison: MRI lumbar spine [DATE]

CLINICAL DATA: Evaluate pancreas lesion

EXAM:
MRI ABDOMEN WITHOUT AND WITH CONTRAST
TECHNIQUE: Multiplanar multisequence MR imaging of the abdomen was performed
both before and after the administration of intravenous contrast.
CONTRAST:  20mL MULTIHANCE GADOBENATE DIMEGLUMINE 529 MG/ML IV SOLN

[Series 3: T2 · coronal · 5.0mm · 1.56mm/px · 2 of 38 slices shown (1 of 3)]
[im 1/38]
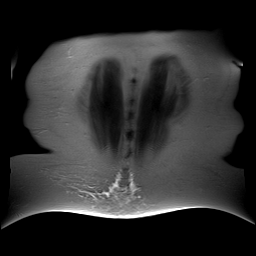
[im 38/38]
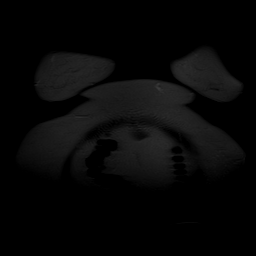

[Series 4: T2 · axial · 6.0mm · 1.56mm/px · z∈[-166,+75]mm · 2 of 36 slices shown (2 of 3)]
[im 1/36]
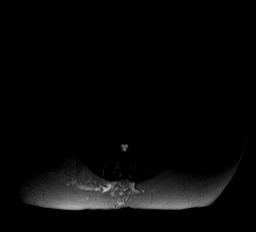
[im 36/36]
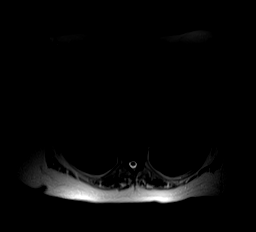

[Series 7: T2 · axial · 6.0mm · 0.78mm/px · z∈[-167,+88]mm · 2 of 38 slices shown (3 of 3)]
[im 1/38]
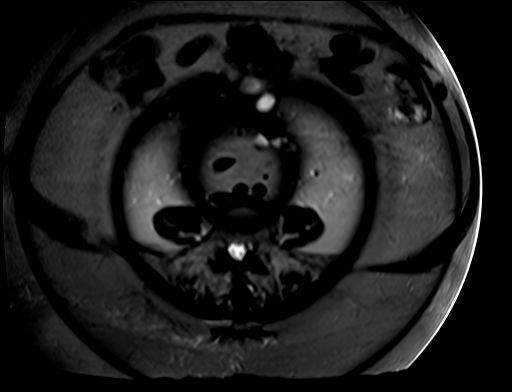
[im 38/38]
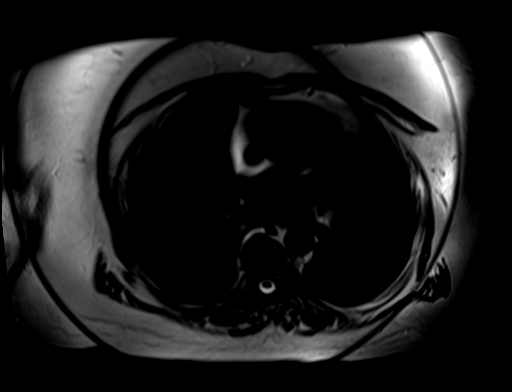

[Series 13: [id number] · axial · 6.0mm · 2.08mm/px · z∈[-161,+94]mm · 4 of 114 slices shown]
[im 1/114]
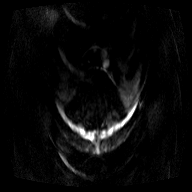
[im 38/114]
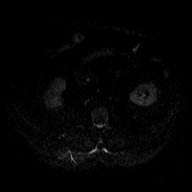
[im 76/114]
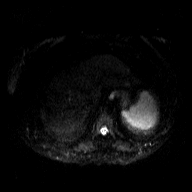
[im 114/114]
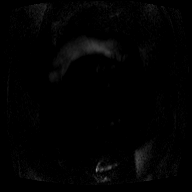

[Series 14: [id number] · axial · 6.0mm · 2.08mm/px · 1 of 38 slices shown]
[im 1/38]
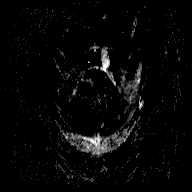

[Series 15: axial tru fisp · axial · 5.0mm · 1.56mm/px · 1 of 43 slices shown]
[im 1/43]
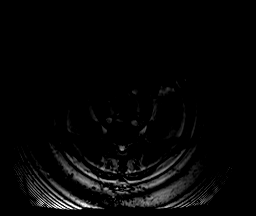

[Series 16: axial in out · axial · 6.0mm · 0.78mm/px · z∈[-176,+66]mm · 3 of 72 slices shown]
[im 1/72]
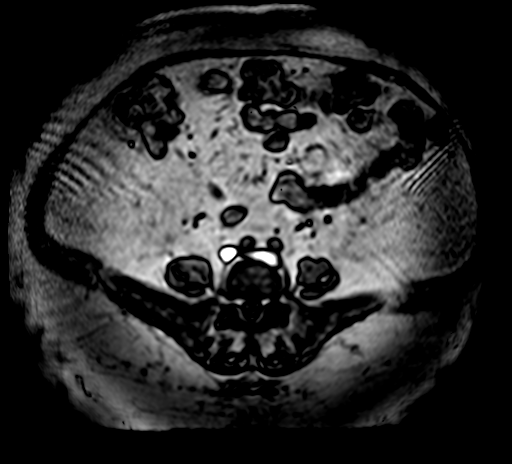
[im 36/72]
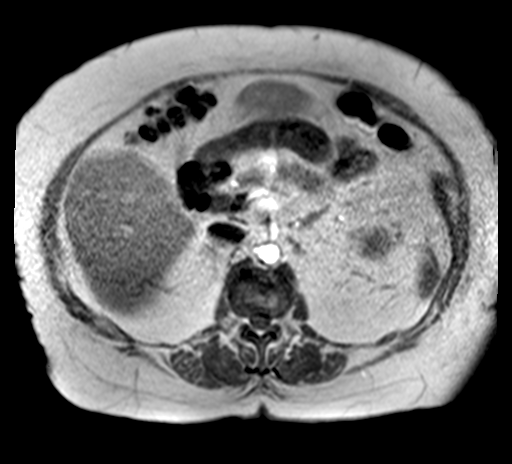
[im 72/72]
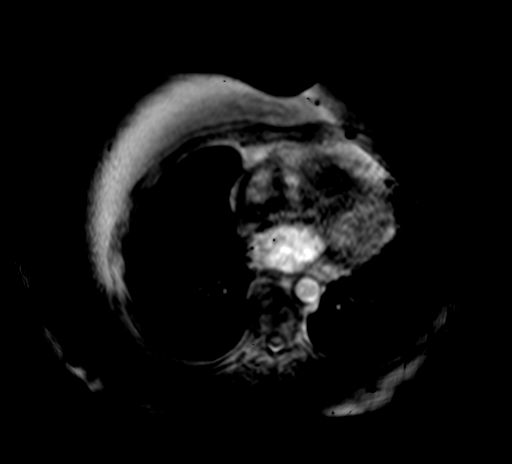

[Series 17: T1 dynamic · axial · non-contrast · 2.5mm · 0.88mm/px · z∈[-170,+67]mm · 3 of 96 slices shown]
[im 1/96]
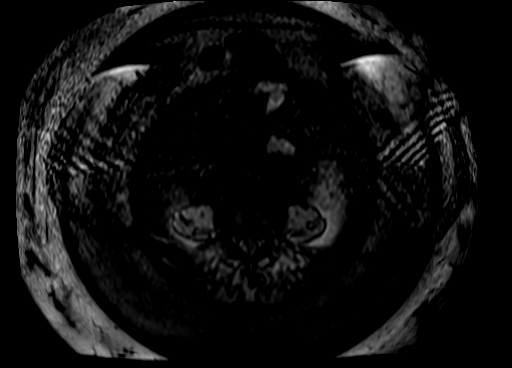
[im 48/96]
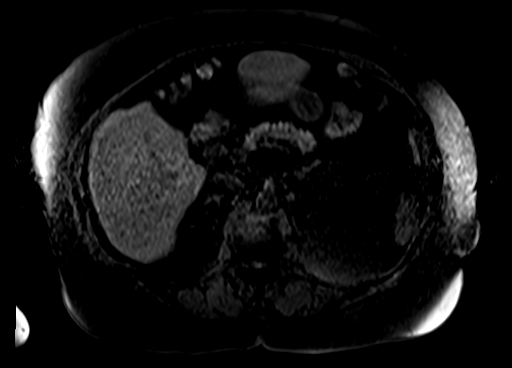
[im 96/96]
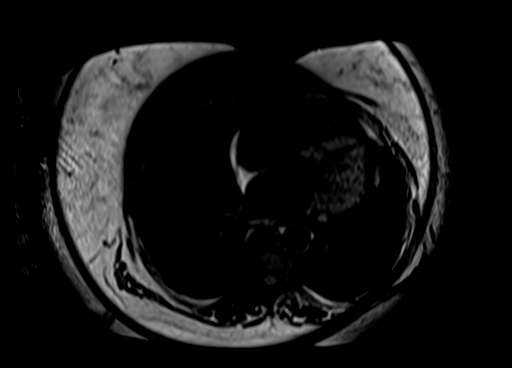

[Series 18: post 25 sec · axial · 2.5mm · 0.88mm/px · z∈[-170,+67]mm · 3 of 96 slices shown]
[im 1/96]
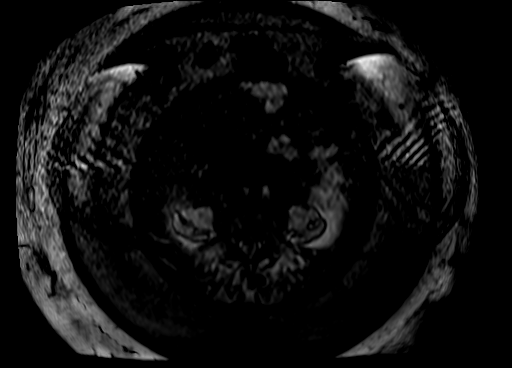
[im 48/96]
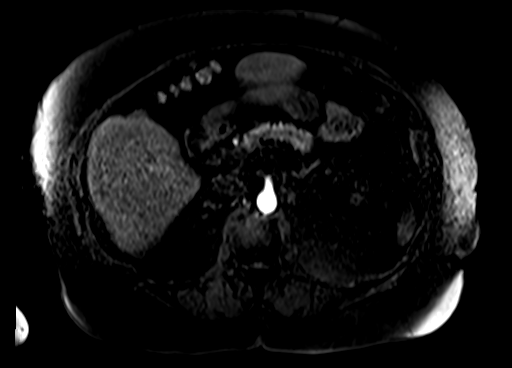
[im 96/96]
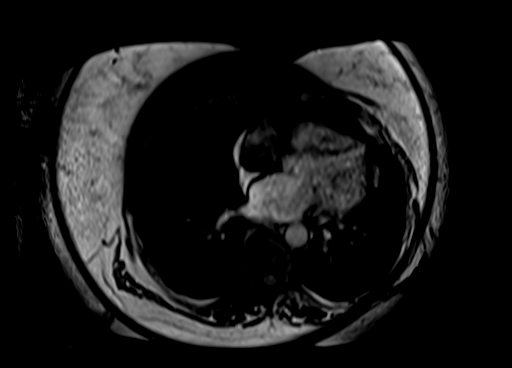

[Series 19: post 25 sec_sub · axial · 2.5mm · 0.88mm/px · z∈[-170,+67]mm · 3 of 96 slices shown]
[im 1/96]
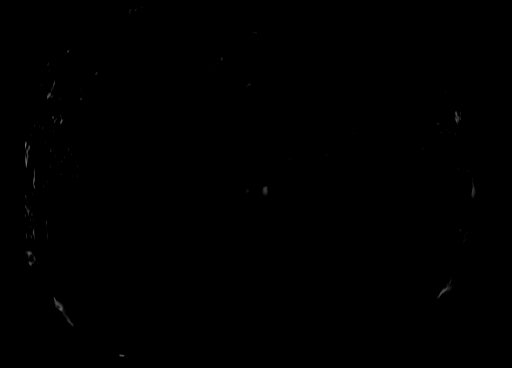
[im 48/96]
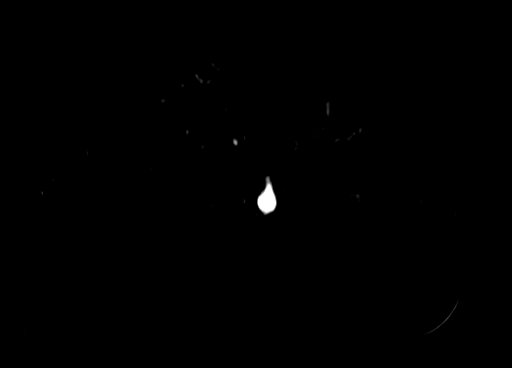
[im 96/96]
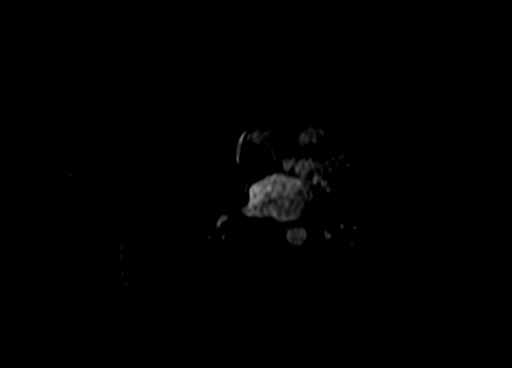

[Series 20: post 45 sec · axial · 2.5mm · 0.88mm/px · z∈[-170,+67]mm · 3 of 96 slices shown]
[im 1/96]
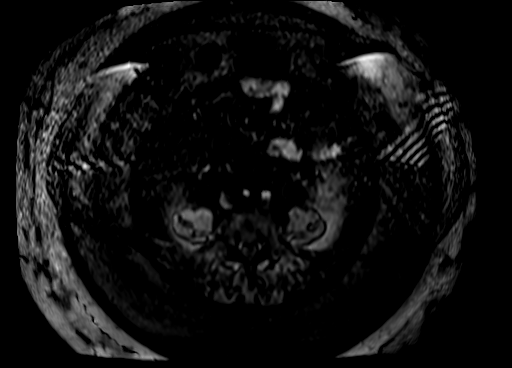
[im 48/96]
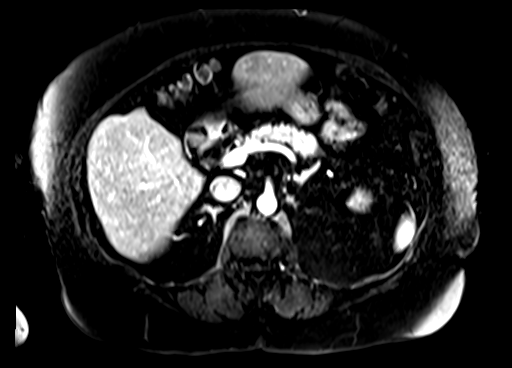
[im 96/96]
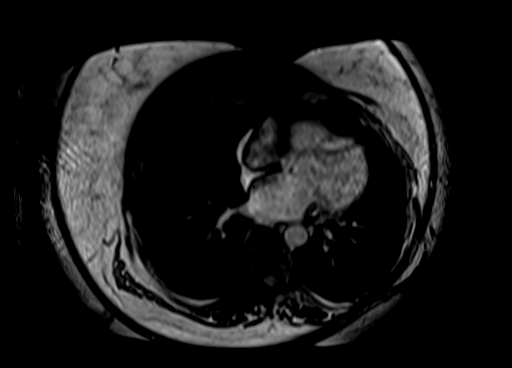

[Series 21: post 45 sec_sub · axial · 2.5mm · 0.88mm/px · z∈[-170,+67]mm · 3 of 96 slices shown]
[im 1/96]
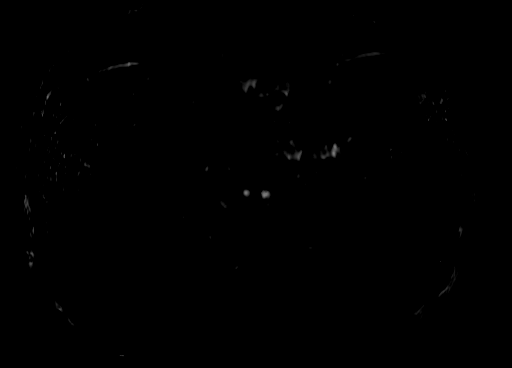
[im 48/96]
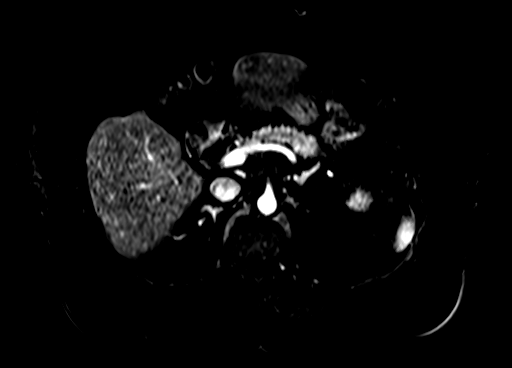
[im 96/96]
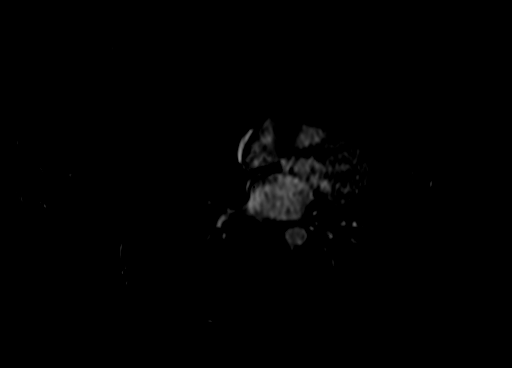

[Series 22: post 90 sec · axial · 2.5mm · 0.88mm/px · 1 of 96 slices shown]
[im 1/96]
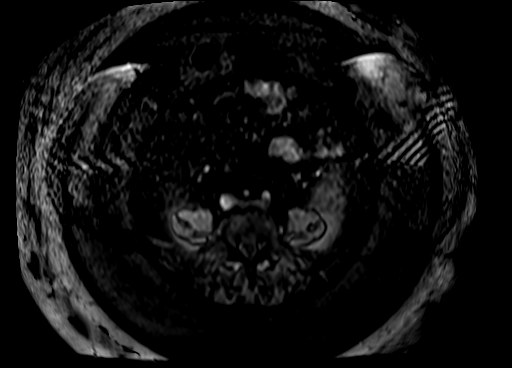

[31 of 48 positions shown; findings below may reference images not displayed]

FINDINGS: Lower chest: No acute findings.

Hepatobiliary: No mass or other parenchymal abnormality identified.
Status post cholecystectomy. No significant intrahepatic bile duct
dilatation. Fusiform dilatation of the common bile duct measures
cm, image [DATE].

Pancreas: The main pancreatic duct has a normal caliber. No
pancreatic inflammation. No mass.

Spleen:  Within normal limits in size and appearance.

Adrenals/Urinary Tract: No masses identified. No evidence of
hydronephrosis. Small bilateral kidney cysts identified. Most of
these are less than 1 cm and thus too small to reliably
characterize.

Stomach/Bowel: Stomach appears normal. Corresponding to the lumbar
spine findings is a well-circumscribed fluid signal intensity
structure arising off the proximal horizontal portion of the
duodenum measures 3.1 x 2.0 cm, image [DATE]. This most likely
represent a duodenal diverticulum. The remaining bowel loops are
unremarkable.

Vascular/Lymphatic: No pathologically enlarged lymph nodes
identified. No abdominal aortic aneurysm demonstrated.

Other:  None.

Musculoskeletal: No suspicious bone lesions identified.
IMPRESSION: 1. Corresponding to the lumbar spine findings is a
well-circumscribed fluid signal intensity structure arising off the
proximal horizontal portion of the duodenum measuring 3.1 x 2.0 cm.
This most likely represent a duodenal diverticulum. No pancreatic
mass identified.
2. Fusiform dilatation of the common bile duct status post
cholecystectomy. No choledocholithiasis.
3. Small bilateral kidney cysts. Most of these are less than 1 cm
and thus too small to reliably characterize.
4. Hepatic steatosis

## 2021-01-07 MED ORDER — GADOBENATE DIMEGLUMINE 529 MG/ML IV SOLN
20.0000 mL | Freq: Once | INTRAVENOUS | Status: AC | PRN
  Administered 2021-01-07: 20 mL via INTRAVENOUS

## 2021-01-08 DIAGNOSIS — I700 Atherosclerosis of aorta: Secondary | ICD-10-CM | POA: Diagnosis not present

## 2021-01-08 DIAGNOSIS — M5416 Radiculopathy, lumbar region: Secondary | ICD-10-CM | POA: Diagnosis not present

## 2021-01-13 ENCOUNTER — Ambulatory Visit: Payer: HMO | Admitting: Physical Therapy

## 2021-01-13 DIAGNOSIS — M5416 Radiculopathy, lumbar region: Secondary | ICD-10-CM | POA: Diagnosis not present

## 2021-01-14 ENCOUNTER — Other Ambulatory Visit: Payer: Self-pay

## 2021-01-14 DIAGNOSIS — I350 Nonrheumatic aortic (valve) stenosis: Secondary | ICD-10-CM

## 2021-01-14 DIAGNOSIS — I700 Atherosclerosis of aorta: Secondary | ICD-10-CM

## 2021-01-20 ENCOUNTER — Encounter: Payer: Self-pay | Admitting: Physical Therapy

## 2021-01-20 ENCOUNTER — Other Ambulatory Visit: Payer: Self-pay

## 2021-01-20 ENCOUNTER — Ambulatory Visit: Payer: HMO | Attending: Chiropractic Medicine | Admitting: Physical Therapy

## 2021-01-20 DIAGNOSIS — G8929 Other chronic pain: Secondary | ICD-10-CM | POA: Insufficient documentation

## 2021-01-20 DIAGNOSIS — M5441 Lumbago with sciatica, right side: Secondary | ICD-10-CM | POA: Insufficient documentation

## 2021-01-20 DIAGNOSIS — M6281 Muscle weakness (generalized): Secondary | ICD-10-CM | POA: Insufficient documentation

## 2021-01-20 NOTE — Patient Instructions (Signed)
Access Code: [email or web address] URL: [email or web address] Date: 01/20/2021 Prepared by: Venetia Night Almina Schul  Exercises Seated Hamstring Stretch - 1 x daily - 7 x weekly - 3 reps - 2 sets - 20 hold Seated Flexion Stretch with Swiss Ball - 1 x daily - 7 x weekly - 5 reps - 3 sets - 5 hold Seated Thoracic Flexion and Rotation with Swiss Ball - 1 x daily - 7 x weekly - 5 reps - 3 sets - 5 hold Seated Long Arc Quad - 1 x daily - 7 x weekly - 10 reps - 3 sets Seated March - 1 x daily - 7 x weekly - 10 reps - 3 sets Seated Heel Toe Raises - 1 x daily - 7 x weekly - 10 reps - 3 sets Seated Hip Adduction Isometrics with Ball - 1 x daily - 7 x weekly - 10 reps - 3 sets Seated Hip Abduction with Resistance - 1 x daily - 7 x weekly - 10 reps - 3 sets Sit to Stand - 3 x daily - 7 x weekly - 2 sets - 5 reps

## 2021-01-20 NOTE — Therapy (Signed)
[facility] [address] [place], [place], [address] Phone: [phone]   Fax:  [phone]  Physical Therapy Evaluation  Patient Details  Name: Tina Hester MRN: [id number] Date of Birth: 10-10-1940 Referring Provider (PT): Levy Pupa, [place]   Encounter Date: 01/20/2021   PT End of Session - 01/20/21 1503    Visit Number 1    Date for PT Re-Evaluation 03/03/21    Authorization Type [employer]    PT Start Time 1145    PT Stop Time 1230    PT Time Calculation (min) 45 min    Activity Tolerance Patient tolerated treatment well    Behavior During Therapy [facility] for tasks assessed/performed           Past Medical History:  Diagnosis Date  . Anemia    s/p Heme work -up normal EGD and colonscopy in 2012 per pt,neg SPEP  . Anxiety   . Aortic stenosis    mild by echo 07/2019  . Bilateral carotid artery stenosis 06/08/2015   1-39% bilateral  . Bradycardia   . CKD (chronic kidney disease), stage III ([place])   . Diabetes mellitus without complication ([place])    type 2  . GERD (gastroesophageal reflux disease)   . Gout   . HSV-1 (herpes simplex virus 1) infection    Acyclovir prn  . Hyperkalemia   . Hyperlipidemia   . Hypertension   . Joint pain    osteoarthritis by Xray- possible erosion of R 4th MCP,elevated uric  acid ,ANA +   . Morbid obesity ([place])   . OSA on CPAP   . Osteopenia   . PAF (paroxysmal atrial fibrillation) ([place])   . Pain management    Neurosurg: Dr Clydell Hakim  . Pulmonary HTN ([place])    Moderate by echo  2016. PASP 9mmHg  . TIA (transient ischemic attack)    remote history of TIA's early 2000    Past Surgical History:  Procedure Laterality Date  . ABDOMINAL HYSTERECTOMY    . APPENDECTOMY    . BACK SURGERY     cervical fusion  . BLADDER SURGERY    . CESAREAN SECTION    . CHOLECYSTECTOMY      There were no vitals filed for this visit.    Subjective Assessment -  01/20/21 1119    Subjective Pt is an 80yo previous Pt referred to OPPT with LBP with ongoing radiculopathy into Rt LE.  Rt LE symptoms include Rt foot numbness.  She is referred for treatment of LBP, gait, weakness and balance deficits.  Pt got a rollator since last visit.  She was surprised she was sent for PT and doesn't feel she needs it.    Pertinent History PMH: a-fib, CHF, DM, abdominal aorta atherosclerosis, anxiety, had nerve root block 11/22/20 on Rt S1 but only 2 days of relief    How long can you sit comfortably? unlimited    How long can you stand comfortably? hard to say b/c I sit a lot, 20-30 min    How long can you walk comfortably? uses rollator through house and in community    Diagnostic tests MRI    Patient Stated Goals "I don't know why I'm here - I don't feel like I need PT" - I can do everything I want to do    Currently in Pain? No/denies              [facility] PT Assessment - 01/20/21 0001  Assessment   Medical Diagnosis M54.16 (ICD-10-CM) - Radiculopathy, lumbar region    Referring Provider (PT) Levy Pupa, PA-C    Onset Date/Surgical Date --   years   Next MD Visit unsure    Prior Therapy yes, here at [facility]      Precautions   Precautions Fall    Precaution Comments on blood thinner      Balance Screen   Has the patient fallen in the past 6 months No    Has the patient had a decrease in activity level because of a fear of falling?  No    Is the patient reluctant to leave their home because of a fear of falling?  No      Home Ecologist residence    Living Arrangements Children;Other relatives    Type of [place] to enter    Entrance Stairs-Number of Steps 3    Entrance Stairs-Rails Can reach both    [place] to live on main level with bedroom/bathroom    [place] - 4 wheels      Prior Function   Level of Independence Independent with household mobility with  device;Independent with community mobility with device    Leisure TV, puzzles, spend time with grandkids      Cognition   Overall Cognitive Status Within Functional Limits for tasks assessed      Observation/Other Assessments   Focus on Therapeutic Outcomes (FOTO)  67% (goal 66)      Sensation   Light Touch Impaired by gross assessment   Rt foot     Functional Tests   Functional tests Squat;Single leg stance      Squat   Comments Pt able to squat and return from squat ind      Single Leg Stance   Comments unable to stand on Rt LE, able to balance on Lt LE x 3 sec      Posture/Postural Control   Posture/Postural Control No significant limitations      ROM / Strength   AROM / PROM / Strength Strength;AROM      AROM   Overall AROM Comments trunk ROM WFL and painfree      Strength   Overall Strength Comments LEs 4+/5 with exception of 3+/5 Rt ankle DF and 4/5 Rt hamstring      Transfers   Transfers Sit to Stand;Stand to Sit    Sit to Stand 6: Modified independent (Device/Increase time)    Five time sit to stand comments  16 sec, no UE assist    Stand to Sit 6: Modified independent (Device/Increase time)      Ambulation/Gait   Ambulation/Gait Yes    Ambulation/Gait Assistance 6: Modified independent (Device/Increase time)    Assistive device Rollator    Gait Pattern Step-through pattern    Stairs Yes    Stairs Assistance 6: Modified independent (Device/Increase time)    Stair Management Technique Alternating pattern;Step to pattern   alteranting up, step to down   Number of Stairs 6    Gait Comments 400' in 3' mild SOB      Balance   Balance Assessed Yes      Standardized Balance Assessment   Standardized Balance Assessment Timed Up and Go Test      Timed Up and Go Test   TUG Normal TUG    Normal TUG (seconds) 14    TUG Comments with rollator  Objective measurements completed on examination: See above findings.                PT Education - 01/20/21 1229    Education Details PAST Access Code: [email or web address]    Person(s) Educated Patient    Methods Explanation;Demonstration;Handout    Comprehension Verbalized understanding;Returned demonstration            PT Short Term Goals - 01/20/21 1513      PT SHORT TERM GOAL #1   Title PT will set goals if Pt decides to follow up with PT.  She initially chose to do evaluation only.    Time 6    Period Weeks    Status New    Target Date 03/03/21      PT SHORT TERM GOAL #2   Title -             PT Long Term Goals - 01/20/21 1514      PT LONG TERM GOAL #1   Title -      PT LONG TERM GOAL #2   Title -      PT LONG TERM GOAL #3   Title -    Baseline -      PT LONG TERM GOAL #4   Title -      PT LONG TERM GOAL #5   Title -    Baseline -                  Plan - 01/20/21 1504    Clinical Impression Statement Pt is a pleasant 80yo female with history of chronic LBP and Rt LE pain and weakness, referred to PT for gait, weakness, balance and LBP.  She has a complex PMH and is a Pt familiar to this clinic from past PT episodes.  Overall, Pt is in much greater physical health and demos much higher activity level than 2 years ago when she was last seen in PT.  Pt reported she was surprised by this referral and doesn't feel she needs PT.  She presents with a rollator and ambulated 400' with 3 min walk test with very mild SOB which was quick to recover.  She has 4+/5 strength in bil LEs with exception of Rt ankle DF 3+/5.  She is able to clear Rt foot consistently with gait despite weakness.  Her trunk ROM is WFL.  She is able to perform functional squat and SLS through Lt LE, but is unable to balance in SLS on Rt LE without UE support.  5x sit to stand is 16 sec and TUG is 14 sec using her rollator.  Pt reports she is able to do everything she wants to do other than drive at this time and felt she may seek her chiropractor who has been  able to help her in the past should she need more therapy.  PT and Pt discussed what would be worked on in PT should she seek to follow up for appointments after today's evaluation.  PT reinforced importance of more frequent household ambulation to improve endurance and reprinted past HEP for her to start again.  PT will plan to set goals at follow up visit should patient decide to follow up with PT.    Personal Factors and Comorbidities Comorbidity 2;Comorbidity 1;Comorbidity 3+    Comorbidities a-fib, CHF, DM, abdominal aorta atherosclerosis    Examination-Participation Restrictions Driving    Stability/Clinical Decision Making Stable/Uncomplicated    Clinical Decision Making Low  Rehab Potential Good    PT Frequency 1x / week    PT Duration 6 weeks    PT Treatment/Interventions Balance training;Therapeutic exercise;Functional mobility training;Patient/family education    PT Next Visit Plan Pt chose to do evaluation only due to being satisfied with current functional level and pain control, no follow ups scheduled at this time    PT Home Exercise Plan PAST Access Code: [email or web address]    Consulted and Agree with Plan of Care Patient           Patient will benefit from skilled therapeutic intervention in order to improve the following deficits and impairments:  Decreased balance,Decreased strength,Decreased endurance  Visit Diagnosis: Muscle weakness (generalized) - Plan: PT plan of care cert/re-cert  Chronic midline low back pain with right-sided sciatica - Plan: PT plan of care cert/re-cert     Problem List Patient Active Problem List   Diagnosis Date Noted  . Hematoma of lower leg 12/18/2018  . Low back pain 12/18/2018  . Contusion of left knee 06/16/2018  . Pain in left knee 06/16/2018  . Rib pain 06/16/2018  . Glaucoma suspect of left eye 12/22/2017  . Secondary open-angle glaucoma of right eye, indeterminate stage 12/22/2017  . Postural dizziness with presyncope 12/13/2017  .  Chest pain 12/13/2017  . Hyperkalemia 12/13/2017  . Acute kidney injury ([place]) 12/13/2017  . Fluid overload 12/13/2017  . Bradycardia 12/13/2017  . CHF (congestive heart failure) ([place]) 12/13/2017  . Aortic stenosis 11/02/2016  . Aortic valve disorder 11/02/2016  . Abrasion of right cornea 04/12/2016  . Status post corneal transplant 04/12/2016  . Pulmonary HTN ([place]) 12/15/2015  . Bilateral carotid artery stenosis 06/08/2015  . Hypertension   . OSA on CPAP   . Persistent atrial fibrillation ([place])   . Morbid obesity ([place]) 09/04/2013  . Gout 09/04/2013  . DM2 (diabetes mellitus, type 2) ([place]) 05/29/2013  . Hyperlipidemia 05/29/2013  . Anxiety 01/31/2012  . CKD (chronic kidney disease), stage III ([place]) 01/31/2012  . Diabetes mellitus type 2 with complications, uncontrolled ([place]) 01/31/2012  . Hypothyroidism 01/31/2012  . TIA (transient ischemic attack) 01/31/2012    Baruch Merl, PT 01/20/21 3:18 PM   [facility] [address], [place], [place], [address] Phone: [phone]   Fax:  [phone]  Name: Tina Hester MRN: [id number] Date of Birth: 08-09-1941

## 2021-01-23 DIAGNOSIS — G4733 Obstructive sleep apnea (adult) (pediatric): Secondary | ICD-10-CM | POA: Diagnosis not present

## 2021-02-02 NOTE — Progress Notes (Signed)
[facility] [place]  ASSESSMENT / PLAN: Tina Hester is a 80 y.o. female with likely neuropathy of left lower extremity. She has mild aortic atherosclerosis on recent MRI performed to study lumbar spine.  Palpable pedal pulses suggest no flow-limiting stenosis between the heart and feet. Her ABI is unreliable because of diabetes, but her toe pressures suggest robust flow.  Patient counseled patients with asymptomatic peripheral arterial disease or claudication have a 1-2% risk of developing chronic limb threatening ischemia, but a 15-30% risk of mortality in the next 5 years. Intervention should only be considered for medically optimized patients with disabling symptoms. .  Recommend the following which can slow the progression of atherosclerosis and reduce the risk of major adverse cardiac / limb events:  Complete cessation from all tobacco products. Blood glucose control with goal A1c < 7%. Blood pressure control with goal blood pressure < 140/90 mmHg. Lipid reduction therapy with goal LDL-C <100 mg/dL (<70 if symptomatic from PAD).  Continue anticoagulation. If anticoagulation is stopped recommend starting ASA 67m PO QD for risk prevention. Atorvastatin 40-80mg PO QD (or other "high intensity" statin therapy).  Counseled patient to follow up with pain specialist.  With me as needed.  CHIEF COMPLAINT: Right leg pain  HISTORY OF PRESENT ILLNESS: Tina MCQUEENis a 80y.o. female referred to clinic for evaluation of abdominal atherosclerosis.  The patient reports pain in the right leg from the calf to the toes in a "stocking" distribution.  The pain is present both at rest and with activity.  The patient is limited in her ability to ambulate.  She does not report symptoms typical of ischemic rest pain.  She has no ulcers about her foot.  Past Medical History:  Diagnosis Date  . Anemia    s/p Heme work -up normal EGD and colonscopy in 2012 per pt,neg SPEP   . Anxiety   . Aortic stenosis    mild by echo 07/2019  . Bilateral carotid artery stenosis 06/08/2015   1-39% bilateral  . Bradycardia   . CKD (chronic kidney disease), stage III ([place]   . Diabetes mellitus without complication ([place]    type 2  . GERD (gastroesophageal reflux disease)   . Gout   . HSV-1 (herpes simplex virus 1) infection    Acyclovir prn  . Hyperkalemia   . Hyperlipidemia   . Hypertension   . Joint pain    osteoarthritis by Xray- possible erosion of R 4th MCP,elevated uric  acid ,ANA +   . Morbid obesity ([place]   . OSA on CPAP   . Osteopenia   . PAF (paroxysmal atrial fibrillation) ([place]   . Pain management    Neurosurg: Dr PClydell Hakim . Pulmonary HTN ([place]    Moderate by echo  2016. PASP 468mg  . TIA (transient ischemic attack)    remote history of TIA's early 2000    Past Surgical History:  Procedure Laterality Date  . ABDOMINAL HYSTERECTOMY    . APPENDECTOMY    . BACK SURGERY     cervical fusion  . BLADDER SURGERY    . CESAREAN SECTION    . CHOLECYSTECTOMY      Family History  Problem Relation Age of Onset  . Arrhythmia Brother     Social History   Socioeconomic History  . Marital status: Divorced    Spouse name: Not on file  . Number of children: Not on file  . Years of education: Not on file  .  Highest education level: Not on file  Occupational History  . Not on file  Tobacco Use  . Smoking status: Never Smoker  . Smokeless tobacco: Never Used  Substance and Sexual Activity  . Alcohol use: No  . Drug use: No  . Sexual activity: Not on file  Other Topics Concern  . Not on file  Social History Narrative  . Not on file   Social Determinants of Health   Financial Resource Strain: Not on file  Food Insecurity: No Food Insecurity  . Worried About Charity fundraiser in the Last Year: Never true  . Ran Out of Food in the Last Year: Never true  Transportation Needs: No Transportation Needs  . Lack of Transportation (Medical):  No  . Lack of Transportation (Non-Medical): No  Physical Activity: Not on file  Stress: Not on file  Social Connections: Not on file  Intimate Partner Violence: Not on file    Allergies  Allergen Reactions  . Pneumococcal Vaccine Anaphylaxis    weakness  . Pneumovax 23 [Pneumococcal Vac Polyvalent] Anaphylaxis    weakness  . Other Hives    Other reaction(s): Other (See Comments) Pneumonia  Vaccine- very ill  . Sulfa Antibiotics Hives    Current Outpatient Medications  Medication Sig Dispense Refill  . acetaminophen (TYLENOL) 325 MG tablet Take by mouth every 6 (six) hours as needed for mild pain or moderate pain.    Marland Kitchen acyclovir (ZOVIRAX) 400 MG tablet Take 400 mg by mouth 2 (two) times daily.     Marland Kitchen ALPRAZolam (XANAX) 0.25 MG tablet Take 0.25 mg by mouth at bedtime as needed for anxiety.    Marland Kitchen amLODipine (NORVASC) 5 MG tablet TAKE 1 TABLET(5 MG) BY MOUTH DAILY 90 tablet 3  . atorvastatin (LIPITOR) 40 MG tablet TAKE 1 TABLET BY MOUTH DAILY AT 6PM 90 tablet 1  . Blood Glucose Monitoring Suppl (FREESTYLE FREEDOM LITE) w/Device KIT 1 each by Does not apply route in the morning and at bedtime. Use Freestyle lite meter to check blood sugar twice daily. 1 kit 1  . CALCIUM PO Take 1 tablet by mouth daily. 600 mg    . Cholecalciferol 2000 units TABS Take 4,000 Units by mouth daily.    Marland Kitchen COLCRYS 0.6 MG tablet Take 0.6 mg by mouth daily as needed (gout).     Marland Kitchen ELIQUIS 5 MG TABS tablet TAKE 1 TABLET(5 MG) BY MOUTH TWICE DAILY 180 tablet 1  . gabapentin (NEURONTIN) 100 MG capsule Take 100-200 mg by mouth at bedtime.     Marland Kitchen glucose blood (FREESTYLE LITE) test strip Use to test blood sugar twice daily. Dx: E11.9 200 each 1  . hydrochlorothiazide (HYDRODIURIL) 25 MG tablet Take 1 tablet (25 mg total) by mouth daily. TAKE 1 TABLET(25 MG) BY MOUTH DAILY. 90 tablet 3  . hydrocortisone cream 1 % Apply 1 application topically daily as needed for itching.    . Insulin NPH Human, Isophane, (NOVOLIN N  FLEXPEN [place]) Inject 30 Units into the skin daily. 15 units at morning and 18 units at night    . Insulin NPH, Human,, Isophane, (NOVOLIN N FLEXPEN RELION) 100 UNIT/ML Kiwkpen Inject 18 Units into the skin 3 (three) times daily. Take 18 units in am and 15 units in pm 15 mL 5  . levothyroxine (SYNTHROID) 150 MCG tablet Take 1 tablet (150 mcg total) by mouth daily. 90 tablet 3  . magnesium 30 MG tablet Take 30 mg by mouth at bedtime.     Marland Kitchen  metFORMIN (GLUCOPHAGE) 500 MG tablet TAKE 1 TABLET BY MOUTH EVERY MORNING AND 2 TABLETS EVERY EVENING 270 tablet 3  . metoprolol succinate (TOPROL-XL) 25 MG 24 hr tablet Take 1 tablet (25 mg total) by mouth at bedtime. 90 tablet 3  . metoprolol succinate (TOPROL-XL) 50 MG 24 hr tablet Take (1 tablet) 50 mg every morning. Take with or immediately following a meal. To be taken in addition to 25 mg tablet every evening. 90 tablet 3  . Molnupiravir 200 MG CAPS TAKE 4 CAPSULES (800 MG TOTAL) BY MOUTH 2 (TWO) TIMES DAILY FOR 5 DAYS. 40 capsule 0  . NEEDLE, DISP, 30 G (BD DISP NEEDLES) 30G X 1/2" MISC 1 each by Does not apply route 2 (two) times daily before a meal. 100 each 5  . ONETOUCH DELICA LANCETS 42H MISC Use to check blood sugars once daily 50 each 3  . pioglitazone (ACTOS) 15 MG tablet TAKE 1 TABLET(15 MG) BY MOUTH DAILY 90 tablet 1  . prednisoLONE acetate (PRED FORTE) 1 % ophthalmic suspension Place 2 drops into the right eye daily.   2  . Semaglutide, 1 MG/DOSE, (OZEMPIC, 1 MG/DOSE,) 2 MG/1.5ML SOPN Inject 1 mg into the skin once a week. 1.5 mL 2  . timolol (TIMOPTIC) 0.5 % ophthalmic solution Place 1 drop into both eyes daily.   1  . vitamin B-12 (CYANOCOBALAMIN) 1000 MCG tablet Take 1,000 mcg by mouth daily.    . vitamin C (ASCORBIC ACID) 500 MG tablet Take 500 mg by mouth daily.     No current facility-administered medications for this visit.    REVIEW OF SYSTEMS:  [X]  denotes positive finding, [ ]  denotes negative finding Cardiac  Comments:  Chest pain  or chest pressure:    Shortness of breath upon exertion:    Short of breath when lying flat:    Irregular heart rhythm:        Vascular    Pain in calf, thigh, or hip brought on by ambulation: x   Pain in feet at night that wakes you up from your sleep:  x   Blood clot in your veins:    Leg swelling:         Pulmonary    Oxygen at home:    Productive cough:     Wheezing:         Neurologic    Sudden weakness in arms or legs:     Sudden numbness in arms or legs:     Sudden onset of difficulty speaking or slurred speech:    Temporary loss of vision in one eye:     Problems with dizziness:         Gastrointestinal    Blood in stool:     Vomited blood:         Genitourinary    Burning when urinating:     Blood in urine:        Psychiatric    Major depression:         Hematologic    Bleeding problems:    Problems with blood clotting too easily:        Skin    Rashes or ulcers:        Constitutional    Fever or chills:      PHYSICAL EXAM  Vitals:   02/03/21 0922  BP: (!) 151/68  Pulse: 89  Resp: 20  SpO2: 99%  Weight: 216 lb (98 kg)  Height: 5' 1.5" (1.562 m)  Constitutional: Elderly appearing. No distress. Obese. Neurologic: CN intact. No focal findings. No sensory loss. Psychiatric: Mood and affect symmetric and appropriate. Eyes: No icterus. No conjunctival pallor. Ears, nose, throat: mucous membranes moist. Midline trachea.  Cardiac: regular rate and rhythm.  Respiratory: unlabored. Abdominal: soft, non-tender, non-distended.  Peripheral vascular:  2+ PT bilaterally Extremity: No edema. No cyanosis. No pallor.  Skin: No gangrene. No ulceration.  Lymphatic: No Stemmer's sign. No palpable lymphadenopathy.  PERTINENT LABORATORY AND RADIOLOGIC DATA  Most recent CBC CBC Latest Ref Rng & Units 04/11/2018 12/13/2017 12/12/2017  WBC 4.0 - 10.5 K/uL 7.4 9.3 7.6  Hemoglobin 12.0 - 15.0 g/dL 11.2(L) 12.6 11.4(L)  Hematocrit 36.0 - 46.0 % 35.7(L) 39.7  35.3(L)  Platelets 150 - 400 K/uL 188 207 211     Most recent CMP CMP Latest Ref Rng & Units 08/06/2020 05/27/2020 02/25/2020  Glucose 70 - 99 mg/dL 103(H) 168(H) 131(H)  BUN 6 - 23 mg/dL 21 25(H) 28(H)  Creatinine 0.40 - 1.20 mg/dL 1.34(H) 1.31(H) 1.14  Sodium 135 - 145 mEq/L 139 137 138  Potassium 3.5 - 5.1 mEq/L 3.9 4.4 4.0  Chloride 96 - 112 mEq/L 100 101 100  CO2 19 - 32 mEq/L 31 29 31   Calcium 8.4 - 10.5 mg/dL 9.4 9.4 10.0  Total Protein 6.0 - 8.3 g/dL 7.3 7.0 7.2  Total Bilirubin 0.2 - 1.2 mg/dL 0.6 0.5 0.5  Alkaline Phos 39 - 117 U/L 73 61 71  AST 0 - 37 U/L 19 18 15   ALT 0 - 35 U/L 16 14 13     Renal function CrCl cannot be calculated (Patient's most recent lab result is older than the maximum 21 days allowed.).  Hgb A1c MFr Bld (%)  Date Value  05/27/2020 7.2 (H)    LDL Cholesterol  Date Value Ref Range Status  01/10/2018 50 0 - 99 mg/dL Final   Direct LDL  Date Value Ref Range Status  08/06/2020 81.0 mg/dL Final    Comment:    Optimal:  <100 mg/dLNear or Above Optimal:  100-129 mg/dLBorderline High:  130-159 mg/dLHigh:  160-189 mg/dLVery High:  >190 mg/dL     Vascular Imaging:   Yevonne Aline. Stanford Breed, MD [facility] Phone Number: [phone] 02/02/2021 5:40 PM

## 2021-02-03 ENCOUNTER — Ambulatory Visit: Payer: HMO | Admitting: Vascular Surgery

## 2021-02-03 ENCOUNTER — Ambulatory Visit (HOSPITAL_COMMUNITY)
Admission: RE | Admit: 2021-02-03 | Discharge: 2021-02-03 | Disposition: A | Discharge status: Home / Self Care | Payer: HMO | Source: Ambulatory Visit | Attending: Vascular Surgery | Admitting: Vascular Surgery

## 2021-02-03 ENCOUNTER — Other Ambulatory Visit: Payer: Self-pay

## 2021-02-03 ENCOUNTER — Encounter: Payer: Self-pay | Admitting: Vascular Surgery

## 2021-02-03 VITALS — BP 151/68 | HR 89 | Resp 20 | Ht 61.5 in | Wt 216.0 lb

## 2021-02-03 DIAGNOSIS — I700 Atherosclerosis of aorta: Secondary | ICD-10-CM | POA: Insufficient documentation

## 2021-02-05 DIAGNOSIS — M5416 Radiculopathy, lumbar region: Secondary | ICD-10-CM | POA: Diagnosis not present

## 2021-02-16 DIAGNOSIS — M5416 Radiculopathy, lumbar region: Secondary | ICD-10-CM | POA: Diagnosis not present

## 2021-02-16 DIAGNOSIS — D6869 Other thrombophilia: Secondary | ICD-10-CM | POA: Diagnosis not present

## 2021-02-23 DIAGNOSIS — G4733 Obstructive sleep apnea (adult) (pediatric): Secondary | ICD-10-CM | POA: Diagnosis not present

## 2021-03-09 DIAGNOSIS — M5416 Radiculopathy, lumbar region: Secondary | ICD-10-CM | POA: Diagnosis not present

## 2021-03-16 DIAGNOSIS — G4733 Obstructive sleep apnea (adult) (pediatric): Secondary | ICD-10-CM | POA: Diagnosis not present

## 2021-03-18 DIAGNOSIS — R5383 Other fatigue: Secondary | ICD-10-CM | POA: Diagnosis not present

## 2021-03-18 DIAGNOSIS — U071 COVID-19: Secondary | ICD-10-CM | POA: Diagnosis not present

## 2021-03-18 DIAGNOSIS — R0981 Nasal congestion: Secondary | ICD-10-CM | POA: Diagnosis not present

## 2021-03-18 DIAGNOSIS — R059 Cough, unspecified: Secondary | ICD-10-CM | POA: Diagnosis not present

## 2021-03-25 DIAGNOSIS — G4733 Obstructive sleep apnea (adult) (pediatric): Secondary | ICD-10-CM | POA: Diagnosis not present

## 2021-04-15 DIAGNOSIS — G4733 Obstructive sleep apnea (adult) (pediatric): Secondary | ICD-10-CM | POA: Diagnosis not present

## 2021-04-25 DIAGNOSIS — G4733 Obstructive sleep apnea (adult) (pediatric): Secondary | ICD-10-CM | POA: Diagnosis not present

## 2021-04-28 ENCOUNTER — Other Ambulatory Visit: Payer: Self-pay | Admitting: Cardiology

## 2021-04-28 DIAGNOSIS — I6523 Occlusion and stenosis of bilateral carotid arteries: Secondary | ICD-10-CM

## 2021-04-28 DIAGNOSIS — I272 Pulmonary hypertension, unspecified: Secondary | ICD-10-CM

## 2021-04-28 DIAGNOSIS — I10 Essential (primary) hypertension: Secondary | ICD-10-CM

## 2021-04-28 DIAGNOSIS — G4733 Obstructive sleep apnea (adult) (pediatric): Secondary | ICD-10-CM

## 2021-04-28 DIAGNOSIS — I350 Nonrheumatic aortic (valve) stenosis: Secondary | ICD-10-CM

## 2021-04-28 DIAGNOSIS — I480 Paroxysmal atrial fibrillation: Secondary | ICD-10-CM

## 2021-05-26 DIAGNOSIS — G4733 Obstructive sleep apnea (adult) (pediatric): Secondary | ICD-10-CM | POA: Diagnosis not present

## 2021-06-03 DIAGNOSIS — M5135 Other intervertebral disc degeneration, thoracolumbar region: Secondary | ICD-10-CM | POA: Diagnosis not present

## 2021-06-03 DIAGNOSIS — Z7984 Long term (current) use of oral hypoglycemic drugs: Secondary | ICD-10-CM | POA: Diagnosis not present

## 2021-06-03 DIAGNOSIS — Z6839 Body mass index (BMI) 39.0-39.9, adult: Secondary | ICD-10-CM | POA: Diagnosis not present

## 2021-06-03 DIAGNOSIS — E11319 Type 2 diabetes mellitus with unspecified diabetic retinopathy without macular edema: Secondary | ICD-10-CM | POA: Diagnosis not present

## 2021-06-03 DIAGNOSIS — I130 Hypertensive heart and chronic kidney disease with heart failure and stage 1 through stage 4 chronic kidney disease, or unspecified chronic kidney disease: Secondary | ICD-10-CM | POA: Diagnosis not present

## 2021-06-03 DIAGNOSIS — H5461 Unqualified visual loss, right eye, normal vision left eye: Secondary | ICD-10-CM | POA: Diagnosis not present

## 2021-06-03 DIAGNOSIS — E1122 Type 2 diabetes mellitus with diabetic chronic kidney disease: Secondary | ICD-10-CM | POA: Diagnosis not present

## 2021-06-03 DIAGNOSIS — M109 Gout, unspecified: Secondary | ICD-10-CM | POA: Diagnosis not present

## 2021-06-03 DIAGNOSIS — M1990 Unspecified osteoarthritis, unspecified site: Secondary | ICD-10-CM | POA: Diagnosis not present

## 2021-06-03 DIAGNOSIS — I5042 Chronic combined systolic (congestive) and diastolic (congestive) heart failure: Secondary | ICD-10-CM | POA: Diagnosis not present

## 2021-06-03 DIAGNOSIS — I4891 Unspecified atrial fibrillation: Secondary | ICD-10-CM | POA: Diagnosis not present

## 2021-06-03 DIAGNOSIS — M47815 Spondylosis without myelopathy or radiculopathy, thoracolumbar region: Secondary | ICD-10-CM | POA: Diagnosis not present

## 2021-06-03 DIAGNOSIS — G4733 Obstructive sleep apnea (adult) (pediatric): Secondary | ICD-10-CM | POA: Diagnosis not present

## 2021-06-03 DIAGNOSIS — N183 Chronic kidney disease, stage 3 unspecified: Secondary | ICD-10-CM | POA: Diagnosis not present

## 2021-06-03 DIAGNOSIS — Z8616 Personal history of COVID-19: Secondary | ICD-10-CM | POA: Diagnosis not present

## 2021-06-03 DIAGNOSIS — F419 Anxiety disorder, unspecified: Secondary | ICD-10-CM | POA: Diagnosis not present

## 2021-06-03 DIAGNOSIS — M8580 Other specified disorders of bone density and structure, unspecified site: Secondary | ICD-10-CM | POA: Diagnosis not present

## 2021-06-03 DIAGNOSIS — Z794 Long term (current) use of insulin: Secondary | ICD-10-CM | POA: Diagnosis not present

## 2021-06-03 DIAGNOSIS — Z8673 Personal history of transient ischemic attack (TIA), and cerebral infarction without residual deficits: Secondary | ICD-10-CM | POA: Diagnosis not present

## 2021-06-03 DIAGNOSIS — I350 Nonrheumatic aortic (valve) stenosis: Secondary | ICD-10-CM | POA: Diagnosis not present

## 2021-06-03 DIAGNOSIS — K219 Gastro-esophageal reflux disease without esophagitis: Secondary | ICD-10-CM | POA: Diagnosis not present

## 2021-06-03 DIAGNOSIS — E039 Hypothyroidism, unspecified: Secondary | ICD-10-CM | POA: Diagnosis not present

## 2021-06-03 DIAGNOSIS — E785 Hyperlipidemia, unspecified: Secondary | ICD-10-CM | POA: Diagnosis not present

## 2021-06-03 DIAGNOSIS — Z7901 Long term (current) use of anticoagulants: Secondary | ICD-10-CM | POA: Diagnosis not present

## 2021-06-04 DIAGNOSIS — M8580 Other specified disorders of bone density and structure, unspecified site: Secondary | ICD-10-CM | POA: Diagnosis not present

## 2021-06-04 DIAGNOSIS — I4891 Unspecified atrial fibrillation: Secondary | ICD-10-CM | POA: Diagnosis not present

## 2021-06-04 DIAGNOSIS — F419 Anxiety disorder, unspecified: Secondary | ICD-10-CM | POA: Diagnosis not present

## 2021-06-04 DIAGNOSIS — E1122 Type 2 diabetes mellitus with diabetic chronic kidney disease: Secondary | ICD-10-CM | POA: Diagnosis not present

## 2021-06-04 DIAGNOSIS — E785 Hyperlipidemia, unspecified: Secondary | ICD-10-CM | POA: Diagnosis not present

## 2021-06-04 DIAGNOSIS — G4733 Obstructive sleep apnea (adult) (pediatric): Secondary | ICD-10-CM | POA: Diagnosis not present

## 2021-06-04 DIAGNOSIS — Z6839 Body mass index (BMI) 39.0-39.9, adult: Secondary | ICD-10-CM | POA: Diagnosis not present

## 2021-06-04 DIAGNOSIS — Z8673 Personal history of transient ischemic attack (TIA), and cerebral infarction without residual deficits: Secondary | ICD-10-CM | POA: Diagnosis not present

## 2021-06-04 DIAGNOSIS — Z7901 Long term (current) use of anticoagulants: Secondary | ICD-10-CM | POA: Diagnosis not present

## 2021-06-04 DIAGNOSIS — I350 Nonrheumatic aortic (valve) stenosis: Secondary | ICD-10-CM | POA: Diagnosis not present

## 2021-06-04 DIAGNOSIS — M109 Gout, unspecified: Secondary | ICD-10-CM | POA: Diagnosis not present

## 2021-06-04 DIAGNOSIS — H5461 Unqualified visual loss, right eye, normal vision left eye: Secondary | ICD-10-CM | POA: Diagnosis not present

## 2021-06-04 DIAGNOSIS — E039 Hypothyroidism, unspecified: Secondary | ICD-10-CM | POA: Diagnosis not present

## 2021-06-04 DIAGNOSIS — I5042 Chronic combined systolic (congestive) and diastolic (congestive) heart failure: Secondary | ICD-10-CM | POA: Diagnosis not present

## 2021-06-04 DIAGNOSIS — M47815 Spondylosis without myelopathy or radiculopathy, thoracolumbar region: Secondary | ICD-10-CM | POA: Diagnosis not present

## 2021-06-04 DIAGNOSIS — M1990 Unspecified osteoarthritis, unspecified site: Secondary | ICD-10-CM | POA: Diagnosis not present

## 2021-06-04 DIAGNOSIS — I130 Hypertensive heart and chronic kidney disease with heart failure and stage 1 through stage 4 chronic kidney disease, or unspecified chronic kidney disease: Secondary | ICD-10-CM | POA: Diagnosis not present

## 2021-06-04 DIAGNOSIS — N183 Chronic kidney disease, stage 3 unspecified: Secondary | ICD-10-CM | POA: Diagnosis not present

## 2021-06-04 DIAGNOSIS — Z7984 Long term (current) use of oral hypoglycemic drugs: Secondary | ICD-10-CM | POA: Diagnosis not present

## 2021-06-04 DIAGNOSIS — Z794 Long term (current) use of insulin: Secondary | ICD-10-CM | POA: Diagnosis not present

## 2021-06-04 DIAGNOSIS — E11319 Type 2 diabetes mellitus with unspecified diabetic retinopathy without macular edema: Secondary | ICD-10-CM | POA: Diagnosis not present

## 2021-06-04 DIAGNOSIS — K219 Gastro-esophageal reflux disease without esophagitis: Secondary | ICD-10-CM | POA: Diagnosis not present

## 2021-06-04 DIAGNOSIS — M5135 Other intervertebral disc degeneration, thoracolumbar region: Secondary | ICD-10-CM | POA: Diagnosis not present

## 2021-06-08 DIAGNOSIS — M8580 Other specified disorders of bone density and structure, unspecified site: Secondary | ICD-10-CM | POA: Diagnosis not present

## 2021-06-08 DIAGNOSIS — Z7984 Long term (current) use of oral hypoglycemic drugs: Secondary | ICD-10-CM | POA: Diagnosis not present

## 2021-06-08 DIAGNOSIS — Z8673 Personal history of transient ischemic attack (TIA), and cerebral infarction without residual deficits: Secondary | ICD-10-CM | POA: Diagnosis not present

## 2021-06-08 DIAGNOSIS — K219 Gastro-esophageal reflux disease without esophagitis: Secondary | ICD-10-CM | POA: Diagnosis not present

## 2021-06-08 DIAGNOSIS — F419 Anxiety disorder, unspecified: Secondary | ICD-10-CM | POA: Diagnosis not present

## 2021-06-08 DIAGNOSIS — M109 Gout, unspecified: Secondary | ICD-10-CM | POA: Diagnosis not present

## 2021-06-08 DIAGNOSIS — Z7901 Long term (current) use of anticoagulants: Secondary | ICD-10-CM | POA: Diagnosis not present

## 2021-06-08 DIAGNOSIS — M47815 Spondylosis without myelopathy or radiculopathy, thoracolumbar region: Secondary | ICD-10-CM | POA: Diagnosis not present

## 2021-06-08 DIAGNOSIS — M5135 Other intervertebral disc degeneration, thoracolumbar region: Secondary | ICD-10-CM | POA: Diagnosis not present

## 2021-06-08 DIAGNOSIS — Z6839 Body mass index (BMI) 39.0-39.9, adult: Secondary | ICD-10-CM | POA: Diagnosis not present

## 2021-06-08 DIAGNOSIS — E785 Hyperlipidemia, unspecified: Secondary | ICD-10-CM | POA: Diagnosis not present

## 2021-06-08 DIAGNOSIS — H5461 Unqualified visual loss, right eye, normal vision left eye: Secondary | ICD-10-CM | POA: Diagnosis not present

## 2021-06-08 DIAGNOSIS — E039 Hypothyroidism, unspecified: Secondary | ICD-10-CM | POA: Diagnosis not present

## 2021-06-08 DIAGNOSIS — I5042 Chronic combined systolic (congestive) and diastolic (congestive) heart failure: Secondary | ICD-10-CM | POA: Diagnosis not present

## 2021-06-08 DIAGNOSIS — I4891 Unspecified atrial fibrillation: Secondary | ICD-10-CM | POA: Diagnosis not present

## 2021-06-08 DIAGNOSIS — G4733 Obstructive sleep apnea (adult) (pediatric): Secondary | ICD-10-CM | POA: Diagnosis not present

## 2021-06-08 DIAGNOSIS — E11319 Type 2 diabetes mellitus with unspecified diabetic retinopathy without macular edema: Secondary | ICD-10-CM | POA: Diagnosis not present

## 2021-06-08 DIAGNOSIS — I350 Nonrheumatic aortic (valve) stenosis: Secondary | ICD-10-CM | POA: Diagnosis not present

## 2021-06-08 DIAGNOSIS — I130 Hypertensive heart and chronic kidney disease with heart failure and stage 1 through stage 4 chronic kidney disease, or unspecified chronic kidney disease: Secondary | ICD-10-CM | POA: Diagnosis not present

## 2021-06-08 DIAGNOSIS — M1990 Unspecified osteoarthritis, unspecified site: Secondary | ICD-10-CM | POA: Diagnosis not present

## 2021-06-08 DIAGNOSIS — N183 Chronic kidney disease, stage 3 unspecified: Secondary | ICD-10-CM | POA: Diagnosis not present

## 2021-06-08 DIAGNOSIS — E1122 Type 2 diabetes mellitus with diabetic chronic kidney disease: Secondary | ICD-10-CM | POA: Diagnosis not present

## 2021-06-08 DIAGNOSIS — Z794 Long term (current) use of insulin: Secondary | ICD-10-CM | POA: Diagnosis not present

## 2021-06-10 DIAGNOSIS — M8580 Other specified disorders of bone density and structure, unspecified site: Secondary | ICD-10-CM | POA: Diagnosis not present

## 2021-06-10 DIAGNOSIS — E1122 Type 2 diabetes mellitus with diabetic chronic kidney disease: Secondary | ICD-10-CM | POA: Diagnosis not present

## 2021-06-10 DIAGNOSIS — E11319 Type 2 diabetes mellitus with unspecified diabetic retinopathy without macular edema: Secondary | ICD-10-CM | POA: Diagnosis not present

## 2021-06-10 DIAGNOSIS — Z7984 Long term (current) use of oral hypoglycemic drugs: Secondary | ICD-10-CM | POA: Diagnosis not present

## 2021-06-10 DIAGNOSIS — E785 Hyperlipidemia, unspecified: Secondary | ICD-10-CM | POA: Diagnosis not present

## 2021-06-10 DIAGNOSIS — F419 Anxiety disorder, unspecified: Secondary | ICD-10-CM | POA: Diagnosis not present

## 2021-06-10 DIAGNOSIS — I350 Nonrheumatic aortic (valve) stenosis: Secondary | ICD-10-CM | POA: Diagnosis not present

## 2021-06-10 DIAGNOSIS — I4891 Unspecified atrial fibrillation: Secondary | ICD-10-CM | POA: Diagnosis not present

## 2021-06-10 DIAGNOSIS — E039 Hypothyroidism, unspecified: Secondary | ICD-10-CM | POA: Diagnosis not present

## 2021-06-10 DIAGNOSIS — G4733 Obstructive sleep apnea (adult) (pediatric): Secondary | ICD-10-CM | POA: Diagnosis not present

## 2021-06-10 DIAGNOSIS — Z6839 Body mass index (BMI) 39.0-39.9, adult: Secondary | ICD-10-CM | POA: Diagnosis not present

## 2021-06-10 DIAGNOSIS — M5135 Other intervertebral disc degeneration, thoracolumbar region: Secondary | ICD-10-CM | POA: Diagnosis not present

## 2021-06-10 DIAGNOSIS — H5461 Unqualified visual loss, right eye, normal vision left eye: Secondary | ICD-10-CM | POA: Diagnosis not present

## 2021-06-10 DIAGNOSIS — I130 Hypertensive heart and chronic kidney disease with heart failure and stage 1 through stage 4 chronic kidney disease, or unspecified chronic kidney disease: Secondary | ICD-10-CM | POA: Diagnosis not present

## 2021-06-10 DIAGNOSIS — Z7901 Long term (current) use of anticoagulants: Secondary | ICD-10-CM | POA: Diagnosis not present

## 2021-06-10 DIAGNOSIS — I5042 Chronic combined systolic (congestive) and diastolic (congestive) heart failure: Secondary | ICD-10-CM | POA: Diagnosis not present

## 2021-06-10 DIAGNOSIS — N183 Chronic kidney disease, stage 3 unspecified: Secondary | ICD-10-CM | POA: Diagnosis not present

## 2021-06-10 DIAGNOSIS — Z794 Long term (current) use of insulin: Secondary | ICD-10-CM | POA: Diagnosis not present

## 2021-06-10 DIAGNOSIS — M109 Gout, unspecified: Secondary | ICD-10-CM | POA: Diagnosis not present

## 2021-06-10 DIAGNOSIS — M47815 Spondylosis without myelopathy or radiculopathy, thoracolumbar region: Secondary | ICD-10-CM | POA: Diagnosis not present

## 2021-06-10 DIAGNOSIS — M1990 Unspecified osteoarthritis, unspecified site: Secondary | ICD-10-CM | POA: Diagnosis not present

## 2021-06-10 DIAGNOSIS — K219 Gastro-esophageal reflux disease without esophagitis: Secondary | ICD-10-CM | POA: Diagnosis not present

## 2021-06-10 DIAGNOSIS — Z8673 Personal history of transient ischemic attack (TIA), and cerebral infarction without residual deficits: Secondary | ICD-10-CM | POA: Diagnosis not present

## 2021-06-11 DIAGNOSIS — Z794 Long term (current) use of insulin: Secondary | ICD-10-CM | POA: Diagnosis not present

## 2021-06-11 DIAGNOSIS — E1122 Type 2 diabetes mellitus with diabetic chronic kidney disease: Secondary | ICD-10-CM | POA: Diagnosis not present

## 2021-06-11 DIAGNOSIS — M109 Gout, unspecified: Secondary | ICD-10-CM | POA: Diagnosis not present

## 2021-06-11 DIAGNOSIS — N183 Chronic kidney disease, stage 3 unspecified: Secondary | ICD-10-CM | POA: Diagnosis not present

## 2021-06-11 DIAGNOSIS — M1990 Unspecified osteoarthritis, unspecified site: Secondary | ICD-10-CM | POA: Diagnosis not present

## 2021-06-11 DIAGNOSIS — I5042 Chronic combined systolic (congestive) and diastolic (congestive) heart failure: Secondary | ICD-10-CM | POA: Diagnosis not present

## 2021-06-11 DIAGNOSIS — K219 Gastro-esophageal reflux disease without esophagitis: Secondary | ICD-10-CM | POA: Diagnosis not present

## 2021-06-11 DIAGNOSIS — M47815 Spondylosis without myelopathy or radiculopathy, thoracolumbar region: Secondary | ICD-10-CM | POA: Diagnosis not present

## 2021-06-11 DIAGNOSIS — M8580 Other specified disorders of bone density and structure, unspecified site: Secondary | ICD-10-CM | POA: Diagnosis not present

## 2021-06-11 DIAGNOSIS — E039 Hypothyroidism, unspecified: Secondary | ICD-10-CM | POA: Diagnosis not present

## 2021-06-11 DIAGNOSIS — Z6839 Body mass index (BMI) 39.0-39.9, adult: Secondary | ICD-10-CM | POA: Diagnosis not present

## 2021-06-11 DIAGNOSIS — I130 Hypertensive heart and chronic kidney disease with heart failure and stage 1 through stage 4 chronic kidney disease, or unspecified chronic kidney disease: Secondary | ICD-10-CM | POA: Diagnosis not present

## 2021-06-11 DIAGNOSIS — I4891 Unspecified atrial fibrillation: Secondary | ICD-10-CM | POA: Diagnosis not present

## 2021-06-11 DIAGNOSIS — H5461 Unqualified visual loss, right eye, normal vision left eye: Secondary | ICD-10-CM | POA: Diagnosis not present

## 2021-06-11 DIAGNOSIS — G4733 Obstructive sleep apnea (adult) (pediatric): Secondary | ICD-10-CM | POA: Diagnosis not present

## 2021-06-11 DIAGNOSIS — Z7984 Long term (current) use of oral hypoglycemic drugs: Secondary | ICD-10-CM | POA: Diagnosis not present

## 2021-06-11 DIAGNOSIS — Z8673 Personal history of transient ischemic attack (TIA), and cerebral infarction without residual deficits: Secondary | ICD-10-CM | POA: Diagnosis not present

## 2021-06-11 DIAGNOSIS — Z7901 Long term (current) use of anticoagulants: Secondary | ICD-10-CM | POA: Diagnosis not present

## 2021-06-11 DIAGNOSIS — E785 Hyperlipidemia, unspecified: Secondary | ICD-10-CM | POA: Diagnosis not present

## 2021-06-11 DIAGNOSIS — M5135 Other intervertebral disc degeneration, thoracolumbar region: Secondary | ICD-10-CM | POA: Diagnosis not present

## 2021-06-11 DIAGNOSIS — F419 Anxiety disorder, unspecified: Secondary | ICD-10-CM | POA: Diagnosis not present

## 2021-06-11 DIAGNOSIS — I350 Nonrheumatic aortic (valve) stenosis: Secondary | ICD-10-CM | POA: Diagnosis not present

## 2021-06-11 DIAGNOSIS — E11319 Type 2 diabetes mellitus with unspecified diabetic retinopathy without macular edema: Secondary | ICD-10-CM | POA: Diagnosis not present

## 2021-06-12 DIAGNOSIS — Z7901 Long term (current) use of anticoagulants: Secondary | ICD-10-CM | POA: Diagnosis not present

## 2021-06-12 DIAGNOSIS — Z8673 Personal history of transient ischemic attack (TIA), and cerebral infarction without residual deficits: Secondary | ICD-10-CM | POA: Diagnosis not present

## 2021-06-12 DIAGNOSIS — E11319 Type 2 diabetes mellitus with unspecified diabetic retinopathy without macular edema: Secondary | ICD-10-CM | POA: Diagnosis not present

## 2021-06-12 DIAGNOSIS — E039 Hypothyroidism, unspecified: Secondary | ICD-10-CM | POA: Diagnosis not present

## 2021-06-12 DIAGNOSIS — I350 Nonrheumatic aortic (valve) stenosis: Secondary | ICD-10-CM | POA: Diagnosis not present

## 2021-06-12 DIAGNOSIS — M1990 Unspecified osteoarthritis, unspecified site: Secondary | ICD-10-CM | POA: Diagnosis not present

## 2021-06-12 DIAGNOSIS — H5461 Unqualified visual loss, right eye, normal vision left eye: Secondary | ICD-10-CM | POA: Diagnosis not present

## 2021-06-12 DIAGNOSIS — I5042 Chronic combined systolic (congestive) and diastolic (congestive) heart failure: Secondary | ICD-10-CM | POA: Diagnosis not present

## 2021-06-12 DIAGNOSIS — F419 Anxiety disorder, unspecified: Secondary | ICD-10-CM | POA: Diagnosis not present

## 2021-06-12 DIAGNOSIS — G4733 Obstructive sleep apnea (adult) (pediatric): Secondary | ICD-10-CM | POA: Diagnosis not present

## 2021-06-12 DIAGNOSIS — E785 Hyperlipidemia, unspecified: Secondary | ICD-10-CM | POA: Diagnosis not present

## 2021-06-12 DIAGNOSIS — M47815 Spondylosis without myelopathy or radiculopathy, thoracolumbar region: Secondary | ICD-10-CM | POA: Diagnosis not present

## 2021-06-12 DIAGNOSIS — Z794 Long term (current) use of insulin: Secondary | ICD-10-CM | POA: Diagnosis not present

## 2021-06-12 DIAGNOSIS — I4891 Unspecified atrial fibrillation: Secondary | ICD-10-CM | POA: Diagnosis not present

## 2021-06-12 DIAGNOSIS — M109 Gout, unspecified: Secondary | ICD-10-CM | POA: Diagnosis not present

## 2021-06-12 DIAGNOSIS — N183 Chronic kidney disease, stage 3 unspecified: Secondary | ICD-10-CM | POA: Diagnosis not present

## 2021-06-12 DIAGNOSIS — K219 Gastro-esophageal reflux disease without esophagitis: Secondary | ICD-10-CM | POA: Diagnosis not present

## 2021-06-12 DIAGNOSIS — Z8616 Personal history of COVID-19: Secondary | ICD-10-CM | POA: Diagnosis not present

## 2021-06-12 DIAGNOSIS — M5135 Other intervertebral disc degeneration, thoracolumbar region: Secondary | ICD-10-CM | POA: Diagnosis not present

## 2021-06-12 DIAGNOSIS — Z6839 Body mass index (BMI) 39.0-39.9, adult: Secondary | ICD-10-CM | POA: Diagnosis not present

## 2021-06-12 DIAGNOSIS — Z7984 Long term (current) use of oral hypoglycemic drugs: Secondary | ICD-10-CM | POA: Diagnosis not present

## 2021-06-12 DIAGNOSIS — M8580 Other specified disorders of bone density and structure, unspecified site: Secondary | ICD-10-CM | POA: Diagnosis not present

## 2021-06-12 DIAGNOSIS — I130 Hypertensive heart and chronic kidney disease with heart failure and stage 1 through stage 4 chronic kidney disease, or unspecified chronic kidney disease: Secondary | ICD-10-CM | POA: Diagnosis not present

## 2021-06-12 DIAGNOSIS — E1122 Type 2 diabetes mellitus with diabetic chronic kidney disease: Secondary | ICD-10-CM | POA: Diagnosis not present

## 2021-06-15 DIAGNOSIS — E11319 Type 2 diabetes mellitus with unspecified diabetic retinopathy without macular edema: Secondary | ICD-10-CM | POA: Diagnosis not present

## 2021-06-15 DIAGNOSIS — Z794 Long term (current) use of insulin: Secondary | ICD-10-CM | POA: Diagnosis not present

## 2021-06-15 DIAGNOSIS — K219 Gastro-esophageal reflux disease without esophagitis: Secondary | ICD-10-CM | POA: Diagnosis not present

## 2021-06-15 DIAGNOSIS — G4733 Obstructive sleep apnea (adult) (pediatric): Secondary | ICD-10-CM | POA: Diagnosis not present

## 2021-06-15 DIAGNOSIS — N183 Chronic kidney disease, stage 3 unspecified: Secondary | ICD-10-CM | POA: Diagnosis not present

## 2021-06-15 DIAGNOSIS — F419 Anxiety disorder, unspecified: Secondary | ICD-10-CM | POA: Diagnosis not present

## 2021-06-15 DIAGNOSIS — M5135 Other intervertebral disc degeneration, thoracolumbar region: Secondary | ICD-10-CM | POA: Diagnosis not present

## 2021-06-15 DIAGNOSIS — M8580 Other specified disorders of bone density and structure, unspecified site: Secondary | ICD-10-CM | POA: Diagnosis not present

## 2021-06-15 DIAGNOSIS — M47815 Spondylosis without myelopathy or radiculopathy, thoracolumbar region: Secondary | ICD-10-CM | POA: Diagnosis not present

## 2021-06-15 DIAGNOSIS — I5042 Chronic combined systolic (congestive) and diastolic (congestive) heart failure: Secondary | ICD-10-CM | POA: Diagnosis not present

## 2021-06-15 DIAGNOSIS — Z7984 Long term (current) use of oral hypoglycemic drugs: Secondary | ICD-10-CM | POA: Diagnosis not present

## 2021-06-15 DIAGNOSIS — M109 Gout, unspecified: Secondary | ICD-10-CM | POA: Diagnosis not present

## 2021-06-15 DIAGNOSIS — I4891 Unspecified atrial fibrillation: Secondary | ICD-10-CM | POA: Diagnosis not present

## 2021-06-15 DIAGNOSIS — E039 Hypothyroidism, unspecified: Secondary | ICD-10-CM | POA: Diagnosis not present

## 2021-06-15 DIAGNOSIS — E785 Hyperlipidemia, unspecified: Secondary | ICD-10-CM | POA: Diagnosis not present

## 2021-06-15 DIAGNOSIS — Z8673 Personal history of transient ischemic attack (TIA), and cerebral infarction without residual deficits: Secondary | ICD-10-CM | POA: Diagnosis not present

## 2021-06-15 DIAGNOSIS — Z6839 Body mass index (BMI) 39.0-39.9, adult: Secondary | ICD-10-CM | POA: Diagnosis not present

## 2021-06-15 DIAGNOSIS — E1122 Type 2 diabetes mellitus with diabetic chronic kidney disease: Secondary | ICD-10-CM | POA: Diagnosis not present

## 2021-06-15 DIAGNOSIS — M1990 Unspecified osteoarthritis, unspecified site: Secondary | ICD-10-CM | POA: Diagnosis not present

## 2021-06-15 DIAGNOSIS — I130 Hypertensive heart and chronic kidney disease with heart failure and stage 1 through stage 4 chronic kidney disease, or unspecified chronic kidney disease: Secondary | ICD-10-CM | POA: Diagnosis not present

## 2021-06-15 DIAGNOSIS — I350 Nonrheumatic aortic (valve) stenosis: Secondary | ICD-10-CM | POA: Diagnosis not present

## 2021-06-15 DIAGNOSIS — H5461 Unqualified visual loss, right eye, normal vision left eye: Secondary | ICD-10-CM | POA: Diagnosis not present

## 2021-06-15 DIAGNOSIS — Z7901 Long term (current) use of anticoagulants: Secondary | ICD-10-CM | POA: Diagnosis not present

## 2021-06-16 DIAGNOSIS — Z7984 Long term (current) use of oral hypoglycemic drugs: Secondary | ICD-10-CM | POA: Diagnosis not present

## 2021-06-16 DIAGNOSIS — E1122 Type 2 diabetes mellitus with diabetic chronic kidney disease: Secondary | ICD-10-CM | POA: Diagnosis not present

## 2021-06-16 DIAGNOSIS — I4891 Unspecified atrial fibrillation: Secondary | ICD-10-CM | POA: Diagnosis not present

## 2021-06-16 DIAGNOSIS — E11319 Type 2 diabetes mellitus with unspecified diabetic retinopathy without macular edema: Secondary | ICD-10-CM | POA: Diagnosis not present

## 2021-06-16 DIAGNOSIS — Z6839 Body mass index (BMI) 39.0-39.9, adult: Secondary | ICD-10-CM | POA: Diagnosis not present

## 2021-06-16 DIAGNOSIS — M5135 Other intervertebral disc degeneration, thoracolumbar region: Secondary | ICD-10-CM | POA: Diagnosis not present

## 2021-06-16 DIAGNOSIS — M1990 Unspecified osteoarthritis, unspecified site: Secondary | ICD-10-CM | POA: Diagnosis not present

## 2021-06-16 DIAGNOSIS — Z7901 Long term (current) use of anticoagulants: Secondary | ICD-10-CM | POA: Diagnosis not present

## 2021-06-16 DIAGNOSIS — M109 Gout, unspecified: Secondary | ICD-10-CM | POA: Diagnosis not present

## 2021-06-16 DIAGNOSIS — G4733 Obstructive sleep apnea (adult) (pediatric): Secondary | ICD-10-CM | POA: Diagnosis not present

## 2021-06-16 DIAGNOSIS — E039 Hypothyroidism, unspecified: Secondary | ICD-10-CM | POA: Diagnosis not present

## 2021-06-16 DIAGNOSIS — K219 Gastro-esophageal reflux disease without esophagitis: Secondary | ICD-10-CM | POA: Diagnosis not present

## 2021-06-16 DIAGNOSIS — I5042 Chronic combined systolic (congestive) and diastolic (congestive) heart failure: Secondary | ICD-10-CM | POA: Diagnosis not present

## 2021-06-16 DIAGNOSIS — Z8673 Personal history of transient ischemic attack (TIA), and cerebral infarction without residual deficits: Secondary | ICD-10-CM | POA: Diagnosis not present

## 2021-06-16 DIAGNOSIS — I130 Hypertensive heart and chronic kidney disease with heart failure and stage 1 through stage 4 chronic kidney disease, or unspecified chronic kidney disease: Secondary | ICD-10-CM | POA: Diagnosis not present

## 2021-06-16 DIAGNOSIS — F419 Anxiety disorder, unspecified: Secondary | ICD-10-CM | POA: Diagnosis not present

## 2021-06-16 DIAGNOSIS — Z794 Long term (current) use of insulin: Secondary | ICD-10-CM | POA: Diagnosis not present

## 2021-06-16 DIAGNOSIS — M8580 Other specified disorders of bone density and structure, unspecified site: Secondary | ICD-10-CM | POA: Diagnosis not present

## 2021-06-16 DIAGNOSIS — I350 Nonrheumatic aortic (valve) stenosis: Secondary | ICD-10-CM | POA: Diagnosis not present

## 2021-06-16 DIAGNOSIS — N183 Chronic kidney disease, stage 3 unspecified: Secondary | ICD-10-CM | POA: Diagnosis not present

## 2021-06-16 DIAGNOSIS — E785 Hyperlipidemia, unspecified: Secondary | ICD-10-CM | POA: Diagnosis not present

## 2021-06-16 DIAGNOSIS — H5461 Unqualified visual loss, right eye, normal vision left eye: Secondary | ICD-10-CM | POA: Diagnosis not present

## 2021-06-16 DIAGNOSIS — M47815 Spondylosis without myelopathy or radiculopathy, thoracolumbar region: Secondary | ICD-10-CM | POA: Diagnosis not present

## 2021-06-18 DIAGNOSIS — I5042 Chronic combined systolic (congestive) and diastolic (congestive) heart failure: Secondary | ICD-10-CM | POA: Diagnosis not present

## 2021-06-18 DIAGNOSIS — H5461 Unqualified visual loss, right eye, normal vision left eye: Secondary | ICD-10-CM | POA: Diagnosis not present

## 2021-06-18 DIAGNOSIS — M8580 Other specified disorders of bone density and structure, unspecified site: Secondary | ICD-10-CM | POA: Diagnosis not present

## 2021-06-18 DIAGNOSIS — G4733 Obstructive sleep apnea (adult) (pediatric): Secondary | ICD-10-CM | POA: Diagnosis not present

## 2021-06-18 DIAGNOSIS — M47815 Spondylosis without myelopathy or radiculopathy, thoracolumbar region: Secondary | ICD-10-CM | POA: Diagnosis not present

## 2021-06-18 DIAGNOSIS — E11319 Type 2 diabetes mellitus with unspecified diabetic retinopathy without macular edema: Secondary | ICD-10-CM | POA: Diagnosis not present

## 2021-06-18 DIAGNOSIS — M5135 Other intervertebral disc degeneration, thoracolumbar region: Secondary | ICD-10-CM | POA: Diagnosis not present

## 2021-06-18 DIAGNOSIS — M109 Gout, unspecified: Secondary | ICD-10-CM | POA: Diagnosis not present

## 2021-06-18 DIAGNOSIS — I350 Nonrheumatic aortic (valve) stenosis: Secondary | ICD-10-CM | POA: Diagnosis not present

## 2021-06-18 DIAGNOSIS — M1990 Unspecified osteoarthritis, unspecified site: Secondary | ICD-10-CM | POA: Diagnosis not present

## 2021-06-18 DIAGNOSIS — Z7901 Long term (current) use of anticoagulants: Secondary | ICD-10-CM | POA: Diagnosis not present

## 2021-06-18 DIAGNOSIS — E039 Hypothyroidism, unspecified: Secondary | ICD-10-CM | POA: Diagnosis not present

## 2021-06-18 DIAGNOSIS — I4891 Unspecified atrial fibrillation: Secondary | ICD-10-CM | POA: Diagnosis not present

## 2021-06-18 DIAGNOSIS — Z7984 Long term (current) use of oral hypoglycemic drugs: Secondary | ICD-10-CM | POA: Diagnosis not present

## 2021-06-18 DIAGNOSIS — E785 Hyperlipidemia, unspecified: Secondary | ICD-10-CM | POA: Diagnosis not present

## 2021-06-18 DIAGNOSIS — Z794 Long term (current) use of insulin: Secondary | ICD-10-CM | POA: Diagnosis not present

## 2021-06-18 DIAGNOSIS — I130 Hypertensive heart and chronic kidney disease with heart failure and stage 1 through stage 4 chronic kidney disease, or unspecified chronic kidney disease: Secondary | ICD-10-CM | POA: Diagnosis not present

## 2021-06-18 DIAGNOSIS — K219 Gastro-esophageal reflux disease without esophagitis: Secondary | ICD-10-CM | POA: Diagnosis not present

## 2021-06-18 DIAGNOSIS — F419 Anxiety disorder, unspecified: Secondary | ICD-10-CM | POA: Diagnosis not present

## 2021-06-18 DIAGNOSIS — Z6839 Body mass index (BMI) 39.0-39.9, adult: Secondary | ICD-10-CM | POA: Diagnosis not present

## 2021-06-18 DIAGNOSIS — Z8673 Personal history of transient ischemic attack (TIA), and cerebral infarction without residual deficits: Secondary | ICD-10-CM | POA: Diagnosis not present

## 2021-06-18 DIAGNOSIS — N183 Chronic kidney disease, stage 3 unspecified: Secondary | ICD-10-CM | POA: Diagnosis not present

## 2021-06-18 DIAGNOSIS — E1122 Type 2 diabetes mellitus with diabetic chronic kidney disease: Secondary | ICD-10-CM | POA: Diagnosis not present

## 2021-06-22 DIAGNOSIS — I4891 Unspecified atrial fibrillation: Secondary | ICD-10-CM | POA: Diagnosis not present

## 2021-06-22 DIAGNOSIS — Z7901 Long term (current) use of anticoagulants: Secondary | ICD-10-CM | POA: Diagnosis not present

## 2021-06-22 DIAGNOSIS — K219 Gastro-esophageal reflux disease without esophagitis: Secondary | ICD-10-CM | POA: Diagnosis not present

## 2021-06-22 DIAGNOSIS — Z7984 Long term (current) use of oral hypoglycemic drugs: Secondary | ICD-10-CM | POA: Diagnosis not present

## 2021-06-22 DIAGNOSIS — E785 Hyperlipidemia, unspecified: Secondary | ICD-10-CM | POA: Diagnosis not present

## 2021-06-22 DIAGNOSIS — M47815 Spondylosis without myelopathy or radiculopathy, thoracolumbar region: Secondary | ICD-10-CM | POA: Diagnosis not present

## 2021-06-22 DIAGNOSIS — I5042 Chronic combined systolic (congestive) and diastolic (congestive) heart failure: Secondary | ICD-10-CM | POA: Diagnosis not present

## 2021-06-22 DIAGNOSIS — F419 Anxiety disorder, unspecified: Secondary | ICD-10-CM | POA: Diagnosis not present

## 2021-06-22 DIAGNOSIS — M8580 Other specified disorders of bone density and structure, unspecified site: Secondary | ICD-10-CM | POA: Diagnosis not present

## 2021-06-22 DIAGNOSIS — M1990 Unspecified osteoarthritis, unspecified site: Secondary | ICD-10-CM | POA: Diagnosis not present

## 2021-06-22 DIAGNOSIS — Z6839 Body mass index (BMI) 39.0-39.9, adult: Secondary | ICD-10-CM | POA: Diagnosis not present

## 2021-06-22 DIAGNOSIS — Z8673 Personal history of transient ischemic attack (TIA), and cerebral infarction without residual deficits: Secondary | ICD-10-CM | POA: Diagnosis not present

## 2021-06-22 DIAGNOSIS — M109 Gout, unspecified: Secondary | ICD-10-CM | POA: Diagnosis not present

## 2021-06-22 DIAGNOSIS — I130 Hypertensive heart and chronic kidney disease with heart failure and stage 1 through stage 4 chronic kidney disease, or unspecified chronic kidney disease: Secondary | ICD-10-CM | POA: Diagnosis not present

## 2021-06-22 DIAGNOSIS — N183 Chronic kidney disease, stage 3 unspecified: Secondary | ICD-10-CM | POA: Diagnosis not present

## 2021-06-22 DIAGNOSIS — E11319 Type 2 diabetes mellitus with unspecified diabetic retinopathy without macular edema: Secondary | ICD-10-CM | POA: Diagnosis not present

## 2021-06-22 DIAGNOSIS — I350 Nonrheumatic aortic (valve) stenosis: Secondary | ICD-10-CM | POA: Diagnosis not present

## 2021-06-22 DIAGNOSIS — M5135 Other intervertebral disc degeneration, thoracolumbar region: Secondary | ICD-10-CM | POA: Diagnosis not present

## 2021-06-22 DIAGNOSIS — G4733 Obstructive sleep apnea (adult) (pediatric): Secondary | ICD-10-CM | POA: Diagnosis not present

## 2021-06-22 DIAGNOSIS — E1122 Type 2 diabetes mellitus with diabetic chronic kidney disease: Secondary | ICD-10-CM | POA: Diagnosis not present

## 2021-06-22 DIAGNOSIS — E039 Hypothyroidism, unspecified: Secondary | ICD-10-CM | POA: Diagnosis not present

## 2021-06-22 DIAGNOSIS — Z794 Long term (current) use of insulin: Secondary | ICD-10-CM | POA: Diagnosis not present

## 2021-06-22 DIAGNOSIS — H5461 Unqualified visual loss, right eye, normal vision left eye: Secondary | ICD-10-CM | POA: Diagnosis not present

## 2021-06-22 DIAGNOSIS — Z8616 Personal history of COVID-19: Secondary | ICD-10-CM | POA: Diagnosis not present

## 2021-06-23 DIAGNOSIS — G4733 Obstructive sleep apnea (adult) (pediatric): Secondary | ICD-10-CM | POA: Diagnosis not present

## 2021-06-23 DIAGNOSIS — M109 Gout, unspecified: Secondary | ICD-10-CM | POA: Diagnosis not present

## 2021-06-23 DIAGNOSIS — K219 Gastro-esophageal reflux disease without esophagitis: Secondary | ICD-10-CM | POA: Diagnosis not present

## 2021-06-23 DIAGNOSIS — Z794 Long term (current) use of insulin: Secondary | ICD-10-CM | POA: Diagnosis not present

## 2021-06-23 DIAGNOSIS — Z8673 Personal history of transient ischemic attack (TIA), and cerebral infarction without residual deficits: Secondary | ICD-10-CM | POA: Diagnosis not present

## 2021-06-23 DIAGNOSIS — E039 Hypothyroidism, unspecified: Secondary | ICD-10-CM | POA: Diagnosis not present

## 2021-06-23 DIAGNOSIS — E11319 Type 2 diabetes mellitus with unspecified diabetic retinopathy without macular edema: Secondary | ICD-10-CM | POA: Diagnosis not present

## 2021-06-23 DIAGNOSIS — M5135 Other intervertebral disc degeneration, thoracolumbar region: Secondary | ICD-10-CM | POA: Diagnosis not present

## 2021-06-23 DIAGNOSIS — Z7984 Long term (current) use of oral hypoglycemic drugs: Secondary | ICD-10-CM | POA: Diagnosis not present

## 2021-06-23 DIAGNOSIS — Z6839 Body mass index (BMI) 39.0-39.9, adult: Secondary | ICD-10-CM | POA: Diagnosis not present

## 2021-06-23 DIAGNOSIS — I350 Nonrheumatic aortic (valve) stenosis: Secondary | ICD-10-CM | POA: Diagnosis not present

## 2021-06-23 DIAGNOSIS — Z7901 Long term (current) use of anticoagulants: Secondary | ICD-10-CM | POA: Diagnosis not present

## 2021-06-23 DIAGNOSIS — F419 Anxiety disorder, unspecified: Secondary | ICD-10-CM | POA: Diagnosis not present

## 2021-06-23 DIAGNOSIS — H5461 Unqualified visual loss, right eye, normal vision left eye: Secondary | ICD-10-CM | POA: Diagnosis not present

## 2021-06-23 DIAGNOSIS — E785 Hyperlipidemia, unspecified: Secondary | ICD-10-CM | POA: Diagnosis not present

## 2021-06-23 DIAGNOSIS — E1122 Type 2 diabetes mellitus with diabetic chronic kidney disease: Secondary | ICD-10-CM | POA: Diagnosis not present

## 2021-06-23 DIAGNOSIS — M47815 Spondylosis without myelopathy or radiculopathy, thoracolumbar region: Secondary | ICD-10-CM | POA: Diagnosis not present

## 2021-06-23 DIAGNOSIS — I130 Hypertensive heart and chronic kidney disease with heart failure and stage 1 through stage 4 chronic kidney disease, or unspecified chronic kidney disease: Secondary | ICD-10-CM | POA: Diagnosis not present

## 2021-06-23 DIAGNOSIS — N183 Chronic kidney disease, stage 3 unspecified: Secondary | ICD-10-CM | POA: Diagnosis not present

## 2021-06-23 DIAGNOSIS — I4891 Unspecified atrial fibrillation: Secondary | ICD-10-CM | POA: Diagnosis not present

## 2021-06-23 DIAGNOSIS — M1990 Unspecified osteoarthritis, unspecified site: Secondary | ICD-10-CM | POA: Diagnosis not present

## 2021-06-23 DIAGNOSIS — M8580 Other specified disorders of bone density and structure, unspecified site: Secondary | ICD-10-CM | POA: Diagnosis not present

## 2021-06-23 DIAGNOSIS — I5042 Chronic combined systolic (congestive) and diastolic (congestive) heart failure: Secondary | ICD-10-CM | POA: Diagnosis not present

## 2021-06-24 DIAGNOSIS — I5042 Chronic combined systolic (congestive) and diastolic (congestive) heart failure: Secondary | ICD-10-CM | POA: Diagnosis not present

## 2021-06-24 DIAGNOSIS — Z7984 Long term (current) use of oral hypoglycemic drugs: Secondary | ICD-10-CM | POA: Diagnosis not present

## 2021-06-24 DIAGNOSIS — G4733 Obstructive sleep apnea (adult) (pediatric): Secondary | ICD-10-CM | POA: Diagnosis not present

## 2021-06-24 DIAGNOSIS — M47815 Spondylosis without myelopathy or radiculopathy, thoracolumbar region: Secondary | ICD-10-CM | POA: Diagnosis not present

## 2021-06-24 DIAGNOSIS — I350 Nonrheumatic aortic (valve) stenosis: Secondary | ICD-10-CM | POA: Diagnosis not present

## 2021-06-24 DIAGNOSIS — I4891 Unspecified atrial fibrillation: Secondary | ICD-10-CM | POA: Diagnosis not present

## 2021-06-24 DIAGNOSIS — E039 Hypothyroidism, unspecified: Secondary | ICD-10-CM | POA: Diagnosis not present

## 2021-06-24 DIAGNOSIS — H5461 Unqualified visual loss, right eye, normal vision left eye: Secondary | ICD-10-CM | POA: Diagnosis not present

## 2021-06-24 DIAGNOSIS — F419 Anxiety disorder, unspecified: Secondary | ICD-10-CM | POA: Diagnosis not present

## 2021-06-24 DIAGNOSIS — Z794 Long term (current) use of insulin: Secondary | ICD-10-CM | POA: Diagnosis not present

## 2021-06-24 DIAGNOSIS — E785 Hyperlipidemia, unspecified: Secondary | ICD-10-CM | POA: Diagnosis not present

## 2021-06-24 DIAGNOSIS — I130 Hypertensive heart and chronic kidney disease with heart failure and stage 1 through stage 4 chronic kidney disease, or unspecified chronic kidney disease: Secondary | ICD-10-CM | POA: Diagnosis not present

## 2021-06-24 DIAGNOSIS — K219 Gastro-esophageal reflux disease without esophagitis: Secondary | ICD-10-CM | POA: Diagnosis not present

## 2021-06-24 DIAGNOSIS — M5135 Other intervertebral disc degeneration, thoracolumbar region: Secondary | ICD-10-CM | POA: Diagnosis not present

## 2021-06-24 DIAGNOSIS — M8580 Other specified disorders of bone density and structure, unspecified site: Secondary | ICD-10-CM | POA: Diagnosis not present

## 2021-06-24 DIAGNOSIS — Z8673 Personal history of transient ischemic attack (TIA), and cerebral infarction without residual deficits: Secondary | ICD-10-CM | POA: Diagnosis not present

## 2021-06-24 DIAGNOSIS — M109 Gout, unspecified: Secondary | ICD-10-CM | POA: Diagnosis not present

## 2021-06-24 DIAGNOSIS — M1990 Unspecified osteoarthritis, unspecified site: Secondary | ICD-10-CM | POA: Diagnosis not present

## 2021-06-24 DIAGNOSIS — N183 Chronic kidney disease, stage 3 unspecified: Secondary | ICD-10-CM | POA: Diagnosis not present

## 2021-06-24 DIAGNOSIS — Z7901 Long term (current) use of anticoagulants: Secondary | ICD-10-CM | POA: Diagnosis not present

## 2021-06-24 DIAGNOSIS — E11319 Type 2 diabetes mellitus with unspecified diabetic retinopathy without macular edema: Secondary | ICD-10-CM | POA: Diagnosis not present

## 2021-06-24 DIAGNOSIS — E1122 Type 2 diabetes mellitus with diabetic chronic kidney disease: Secondary | ICD-10-CM | POA: Diagnosis not present

## 2021-06-24 DIAGNOSIS — Z6839 Body mass index (BMI) 39.0-39.9, adult: Secondary | ICD-10-CM | POA: Diagnosis not present

## 2021-06-25 DIAGNOSIS — Z7984 Long term (current) use of oral hypoglycemic drugs: Secondary | ICD-10-CM | POA: Diagnosis not present

## 2021-06-25 DIAGNOSIS — H5461 Unqualified visual loss, right eye, normal vision left eye: Secondary | ICD-10-CM | POA: Diagnosis not present

## 2021-06-25 DIAGNOSIS — G4733 Obstructive sleep apnea (adult) (pediatric): Secondary | ICD-10-CM | POA: Diagnosis not present

## 2021-06-25 DIAGNOSIS — K219 Gastro-esophageal reflux disease without esophagitis: Secondary | ICD-10-CM | POA: Diagnosis not present

## 2021-06-25 DIAGNOSIS — I130 Hypertensive heart and chronic kidney disease with heart failure and stage 1 through stage 4 chronic kidney disease, or unspecified chronic kidney disease: Secondary | ICD-10-CM | POA: Diagnosis not present

## 2021-06-25 DIAGNOSIS — M47815 Spondylosis without myelopathy or radiculopathy, thoracolumbar region: Secondary | ICD-10-CM | POA: Diagnosis not present

## 2021-06-25 DIAGNOSIS — Z8673 Personal history of transient ischemic attack (TIA), and cerebral infarction without residual deficits: Secondary | ICD-10-CM | POA: Diagnosis not present

## 2021-06-25 DIAGNOSIS — E039 Hypothyroidism, unspecified: Secondary | ICD-10-CM | POA: Diagnosis not present

## 2021-06-25 DIAGNOSIS — M8580 Other specified disorders of bone density and structure, unspecified site: Secondary | ICD-10-CM | POA: Diagnosis not present

## 2021-06-25 DIAGNOSIS — M1990 Unspecified osteoarthritis, unspecified site: Secondary | ICD-10-CM | POA: Diagnosis not present

## 2021-06-25 DIAGNOSIS — F419 Anxiety disorder, unspecified: Secondary | ICD-10-CM | POA: Diagnosis not present

## 2021-06-25 DIAGNOSIS — Z794 Long term (current) use of insulin: Secondary | ICD-10-CM | POA: Diagnosis not present

## 2021-06-25 DIAGNOSIS — Z6839 Body mass index (BMI) 39.0-39.9, adult: Secondary | ICD-10-CM | POA: Diagnosis not present

## 2021-06-25 DIAGNOSIS — Z7901 Long term (current) use of anticoagulants: Secondary | ICD-10-CM | POA: Diagnosis not present

## 2021-06-25 DIAGNOSIS — E785 Hyperlipidemia, unspecified: Secondary | ICD-10-CM | POA: Diagnosis not present

## 2021-06-25 DIAGNOSIS — M109 Gout, unspecified: Secondary | ICD-10-CM | POA: Diagnosis not present

## 2021-06-25 DIAGNOSIS — I4891 Unspecified atrial fibrillation: Secondary | ICD-10-CM | POA: Diagnosis not present

## 2021-06-25 DIAGNOSIS — I5042 Chronic combined systolic (congestive) and diastolic (congestive) heart failure: Secondary | ICD-10-CM | POA: Diagnosis not present

## 2021-06-25 DIAGNOSIS — E1122 Type 2 diabetes mellitus with diabetic chronic kidney disease: Secondary | ICD-10-CM | POA: Diagnosis not present

## 2021-06-25 DIAGNOSIS — N183 Chronic kidney disease, stage 3 unspecified: Secondary | ICD-10-CM | POA: Diagnosis not present

## 2021-06-25 DIAGNOSIS — I350 Nonrheumatic aortic (valve) stenosis: Secondary | ICD-10-CM | POA: Diagnosis not present

## 2021-06-25 DIAGNOSIS — E11319 Type 2 diabetes mellitus with unspecified diabetic retinopathy without macular edema: Secondary | ICD-10-CM | POA: Diagnosis not present

## 2021-06-25 DIAGNOSIS — M5135 Other intervertebral disc degeneration, thoracolumbar region: Secondary | ICD-10-CM | POA: Diagnosis not present

## 2021-07-17 DIAGNOSIS — G4733 Obstructive sleep apnea (adult) (pediatric): Secondary | ICD-10-CM | POA: Diagnosis not present

## 2021-07-29 DIAGNOSIS — I4891 Unspecified atrial fibrillation: Secondary | ICD-10-CM | POA: Diagnosis not present

## 2021-07-29 DIAGNOSIS — E11319 Type 2 diabetes mellitus with unspecified diabetic retinopathy without macular edema: Secondary | ICD-10-CM | POA: Diagnosis not present

## 2021-07-29 DIAGNOSIS — E1165 Type 2 diabetes mellitus with hyperglycemia: Secondary | ICD-10-CM | POA: Diagnosis not present

## 2021-07-29 DIAGNOSIS — E039 Hypothyroidism, unspecified: Secondary | ICD-10-CM | POA: Diagnosis not present

## 2021-07-29 DIAGNOSIS — I5043 Acute on chronic combined systolic (congestive) and diastolic (congestive) heart failure: Secondary | ICD-10-CM | POA: Diagnosis not present

## 2021-07-29 DIAGNOSIS — E1121 Type 2 diabetes mellitus with diabetic nephropathy: Secondary | ICD-10-CM | POA: Diagnosis not present

## 2021-07-29 DIAGNOSIS — N183 Chronic kidney disease, stage 3 unspecified: Secondary | ICD-10-CM | POA: Diagnosis not present

## 2021-07-29 DIAGNOSIS — G4700 Insomnia, unspecified: Secondary | ICD-10-CM | POA: Diagnosis not present

## 2021-07-29 DIAGNOSIS — F330 Major depressive disorder, recurrent, mild: Secondary | ICD-10-CM | POA: Diagnosis not present

## 2021-07-29 DIAGNOSIS — D649 Anemia, unspecified: Secondary | ICD-10-CM | POA: Diagnosis not present

## 2021-07-29 DIAGNOSIS — I10 Essential (primary) hypertension: Secondary | ICD-10-CM | POA: Diagnosis not present

## 2021-07-29 DIAGNOSIS — E7800 Pure hypercholesterolemia, unspecified: Secondary | ICD-10-CM | POA: Diagnosis not present

## 2021-08-03 DIAGNOSIS — E1121 Type 2 diabetes mellitus with diabetic nephropathy: Secondary | ICD-10-CM | POA: Diagnosis not present

## 2021-08-03 DIAGNOSIS — Z23 Encounter for immunization: Secondary | ICD-10-CM | POA: Diagnosis not present

## 2021-08-03 DIAGNOSIS — E039 Hypothyroidism, unspecified: Secondary | ICD-10-CM | POA: Diagnosis not present

## 2021-08-03 DIAGNOSIS — I4891 Unspecified atrial fibrillation: Secondary | ICD-10-CM | POA: Diagnosis not present

## 2021-08-03 DIAGNOSIS — G3184 Mild cognitive impairment, so stated: Secondary | ICD-10-CM | POA: Diagnosis not present

## 2021-08-03 DIAGNOSIS — M79604 Pain in right leg: Secondary | ICD-10-CM | POA: Diagnosis not present

## 2021-08-03 DIAGNOSIS — D649 Anemia, unspecified: Secondary | ICD-10-CM | POA: Diagnosis not present

## 2021-08-03 DIAGNOSIS — I10 Essential (primary) hypertension: Secondary | ICD-10-CM | POA: Diagnosis not present

## 2021-08-03 DIAGNOSIS — E2839 Other primary ovarian failure: Secondary | ICD-10-CM | POA: Diagnosis not present

## 2021-08-06 ENCOUNTER — Other Ambulatory Visit: Payer: Self-pay | Admitting: Family Medicine

## 2021-08-06 DIAGNOSIS — E2839 Other primary ovarian failure: Secondary | ICD-10-CM

## 2021-08-19 DIAGNOSIS — N1832 Chronic kidney disease, stage 3b: Secondary | ICD-10-CM | POA: Diagnosis not present

## 2021-08-27 ENCOUNTER — Ambulatory Visit: Payer: HMO | Admitting: Endocrinology

## 2021-08-28 DIAGNOSIS — E039 Hypothyroidism, unspecified: Secondary | ICD-10-CM | POA: Diagnosis not present

## 2021-08-28 DIAGNOSIS — E11319 Type 2 diabetes mellitus with unspecified diabetic retinopathy without macular edema: Secondary | ICD-10-CM | POA: Diagnosis not present

## 2021-08-28 DIAGNOSIS — I5042 Chronic combined systolic (congestive) and diastolic (congestive) heart failure: Secondary | ICD-10-CM | POA: Diagnosis not present

## 2021-08-28 DIAGNOSIS — N183 Chronic kidney disease, stage 3 unspecified: Secondary | ICD-10-CM | POA: Diagnosis not present

## 2021-08-28 DIAGNOSIS — I4891 Unspecified atrial fibrillation: Secondary | ICD-10-CM | POA: Diagnosis not present

## 2021-08-28 DIAGNOSIS — I10 Essential (primary) hypertension: Secondary | ICD-10-CM | POA: Diagnosis not present

## 2021-08-28 DIAGNOSIS — G4700 Insomnia, unspecified: Secondary | ICD-10-CM | POA: Diagnosis not present

## 2021-08-28 DIAGNOSIS — F322 Major depressive disorder, single episode, severe without psychotic features: Secondary | ICD-10-CM | POA: Diagnosis not present

## 2021-08-28 DIAGNOSIS — I5043 Acute on chronic combined systolic (congestive) and diastolic (congestive) heart failure: Secondary | ICD-10-CM | POA: Diagnosis not present

## 2021-08-28 DIAGNOSIS — E1121 Type 2 diabetes mellitus with diabetic nephropathy: Secondary | ICD-10-CM | POA: Diagnosis not present

## 2021-08-28 DIAGNOSIS — E1165 Type 2 diabetes mellitus with hyperglycemia: Secondary | ICD-10-CM | POA: Diagnosis not present

## 2021-08-28 DIAGNOSIS — E7800 Pure hypercholesterolemia, unspecified: Secondary | ICD-10-CM | POA: Diagnosis not present

## 2021-09-02 ENCOUNTER — Telehealth: Payer: Self-pay

## 2021-09-02 ENCOUNTER — Other Ambulatory Visit: Payer: Self-pay | Admitting: Endocrinology

## 2021-09-02 NOTE — Telephone Encounter (Signed)
Spoke with daughter advise mother does not need to come to the lab on 09-03-2021.  Kieth Brightly

## 2021-09-03 ENCOUNTER — Other Ambulatory Visit: Payer: HMO

## 2021-09-10 ENCOUNTER — Encounter: Payer: Self-pay | Admitting: Endocrinology

## 2021-09-10 ENCOUNTER — Ambulatory Visit (INDEPENDENT_AMBULATORY_CARE_PROVIDER_SITE_OTHER): Payer: HMO | Admitting: Endocrinology

## 2021-09-10 ENCOUNTER — Other Ambulatory Visit: Payer: Self-pay

## 2021-09-10 VITALS — BP 142/80 | HR 128 | Ht 62.0 in | Wt 219.6 lb

## 2021-09-10 DIAGNOSIS — E1129 Type 2 diabetes mellitus with other diabetic kidney complication: Secondary | ICD-10-CM

## 2021-09-10 DIAGNOSIS — E1165 Type 2 diabetes mellitus with hyperglycemia: Secondary | ICD-10-CM

## 2021-09-10 DIAGNOSIS — E782 Mixed hyperlipidemia: Secondary | ICD-10-CM | POA: Diagnosis not present

## 2021-09-10 DIAGNOSIS — R809 Proteinuria, unspecified: Secondary | ICD-10-CM

## 2021-09-10 DIAGNOSIS — Z794 Long term (current) use of insulin: Secondary | ICD-10-CM | POA: Diagnosis not present

## 2021-09-10 DIAGNOSIS — E063 Autoimmune thyroiditis: Secondary | ICD-10-CM | POA: Diagnosis not present

## 2021-09-10 LAB — POCT GLUCOSE (DEVICE FOR HOME USE): Glucose Fasting, POC: 427 mg/dL — AB (ref 70–99)

## 2021-09-10 MED ORDER — TOUJEO SOLOSTAR 300 UNIT/ML ~~LOC~~ SOPN: 30.0000 [IU] | PEN_INJECTOR | Freq: Every day | SUBCUTANEOUS | 3 refills | Status: DC

## 2021-09-10 NOTE — Progress Notes (Signed)
Patient ID: Tina Hester, female   DOB: December 24, 1940, 81 y.o.   MRN: [id number]    Reason for Appointment: follow-up   History of Present Illness   Diagnosis: Type 2 DIABETES MELITUS, long-standing    Previous history:  She has been on insulin for a few years. Because of cost she has been switched from Lantus to NPH insulin twice a day She had previously been taking metformin but this was stopped when she had renal insufficiency Overall her blood sugars have been difficult to control and she has previously had relatively poor control. However with adding Actos her sugars had started improving significantly Also with her being very consistent with diet in 2015 her A1c had gone down to 6.4 To improve her control she was given a trial of the V-go pump in 2/17 but she had allergic skin reaction with the adhesive  Recent history:    Insulin regimen: N 15 units a.m.,-18 units at bedtime daily  Oral hypoglycemic drugs: Actos 15 mg, Metformin 500 mg in a.m.-1000 mg, Ozempic 0.5 mg   Her A1c is last 9.9  Current blood sugar patterns, management and problems identified:  She had not been seen in follow-up for over a year Her daughter came in with her today and says that she is having memory issues Despite her daughter trying to supervise her medications and insulin she is still missing at least 1 insulin dose per day usually She was having some issues with orthopedic problems last year and for some time stopped taking her insulin and medications She has not taken any regular insulin She has also taken her Ozempic irregularly She is getting her medications in a pill pack from the pharmacy Recently taking oral medications more regularly  has not checked her sugars in a few weeks and does not remember what they were running Apparently her PCP has ordered freestyle libre but pharmacy states is not going to be available till next month Today in the office blood sugar was over 400  right after breakfast and not clear if she took her insulin last night or this morning Now she is asking whether she should take her insulin when she is not eating even the NPH insulin No hypoglycemic symptoms reported                 Meals 10 am 6 pm, sometimes will eat lunch  Side effects from medications:She did not tolerate Invokana and was having muscle cramps  Monitors blood glucose: occasionally     Glucometer:  FreeStyle        Blood Glucose readings:none  Previously:  PRE-MEAL Fasting Lunch Dinner Bedtime Overall  Glucose range:  92-159      Mean/median:  115   114, 125   118     Wt Readings from Last 3 Encounters:  09/10/21 219 lb 9.6 oz (99.6 kg)  02/03/21 216 lb (98 kg)  08/08/20 231 lb (104.8 kg)   LABS:  Lab Results  Component Value Date   HGBA1C 7.2 (H) 05/27/2020   HGBA1C 6.8 (H) 02/25/2020   HGBA1C 6.3 11/22/2019   Lab Results  Component Value Date   MICROALBUR 6.2 (H) 11/22/2019   LDLCALC 50 01/10/2018   CREATININE 1.34 (H) 08/06/2020   No visits with results within 1 Week(s) from this visit.  Latest known visit with results is:  Lab on 08/06/2020  Component Date Value Ref Range Status   Sodium 08/06/2020 139  135 - 145 mEq/L Final  Potassium 08/06/2020 3.9  3.5 - 5.1 mEq/L Final   Chloride 08/06/2020 100  96 - 112 mEq/L Final   CO2 08/06/2020 31  19 - 32 mEq/L Final   Glucose, Bld 08/06/2020 103 (H)  70 - 99 mg/dL Final   BUN 08/06/2020 21  6 - 23 mg/dL Final   Creatinine, Ser 08/06/2020 1.34 (H)  0.40 - 1.20 mg/dL Final   Total Bilirubin 08/06/2020 0.6  0.2 - 1.2 mg/dL Final   Alkaline Phosphatase 08/06/2020 73  39 - 117 U/L Final   AST 08/06/2020 19  0 - 37 U/L Final   ALT 08/06/2020 16  0 - 35 U/L Final   Total Protein 08/06/2020 7.3  6.0 - 8.3 g/dL Final   Albumin 08/06/2020 3.9  3.5 - 5.2 g/dL Final   GFR 08/06/2020 37.70 (L)  >60.00 mL/min Final   Calculated using the CKD-EPI Creatinine Equation (2021)   Calcium 08/06/2020 9.4  8.4 -  10.5 mg/dL Final   Cholesterol 08/06/2020 144  0 - 200 mg/dL Final   ATP III Classification       Desirable:  < 200 mg/dL               Borderline High:  200 - 239 mg/dL          High:  > = 240 mg/dL   Triglycerides 08/06/2020 249.0 (H)  0.0 - 149.0 mg/dL Final   Normal:  <150 mg/dLBorderline High:  150 - 199 mg/dL   HDL 08/06/2020 29.00 (L)  >39.00 mg/dL Final   VLDL 08/06/2020 49.8 (H)  0.0 - 40.0 mg/dL Final   Total CHOL/HDL Ratio 08/06/2020 5   Final                  Men          Women1/2 Average Risk     3.4          3.3Average Risk          5.0          4.42X Average Risk          9.6          7.13X Average Risk          15.0          11.0                       NonHDL 08/06/2020 115.10   Final   NOTE:  Non-HDL goal should be 30 mg/dL higher than patient's LDL goal (i.e. LDL goal of < 70 mg/dL, would have non-HDL goal of < 100 mg/dL)   Free T4 08/06/2020 1.34  0.60 - 1.60 ng/dL Final   Comment: Specimens from patients who are undergoing biotin therapy and /or ingesting biotin supplements may contain high levels of biotin.  The higher biotin concentration in these specimens interferes with this Free T4 assay.  Specimens that contain high levels  of biotin may cause false high results for this Free T4 assay.  Please interpret results in light of the total clinical presentation of the patient.     TSH 08/06/2020 1.07  0.35 - 4.50 uIU/mL Final   Direct LDL 08/06/2020 81.0  mg/dL Final   Optimal:  <100 mg/dLNear or Above Optimal:  100-129 mg/dLBorderline High:  130-159 mg/dLHigh:  160-189 mg/dLVery High:  >190 mg/dL     Allergies as of 09/10/2021       Reactions   Pneumococcal Vaccine  Anaphylaxis   weakness   Pneumovax 23 [pneumococcal Vac Polyvalent] Anaphylaxis   weakness   Other Hives   Other reaction(s): Other (See Comments) Pneumonia  Vaccine- very ill   Sulfa Antibiotics Hives   Pregabalin    Other reaction(s): depression        Medication List        Accurate as of  September 10, 2021 11:56 AM. If you have any questions, ask your nurse or doctor.          acetaminophen 325 MG tablet Commonly known as: TYLENOL Take by mouth every 6 (six) hours as needed for mild pain or moderate pain.   acyclovir 400 MG tablet Commonly known as: ZOVIRAX Take 400 mg by mouth 2 (two) times daily.   ALPRAZolam 0.25 MG tablet Commonly known as: XANAX Take 0.25 mg by mouth at bedtime as needed for anxiety.   amLODipine 5 MG tablet Commonly known as: NORVASC TAKE 1 TABLET(5 MG) BY MOUTH DAILY   atorvastatin 40 MG tablet Commonly known as: LIPITOR TAKE 1 TABLET BY MOUTH DAILY AT 6 PM   BD Disp Needles 30G X 1/2" Misc Generic drug: NEEDLE (DISP) 30 G 1 each by Does not apply route 2 (two) times daily before a meal.   CALCIUM PO Take 1 tablet by mouth daily. 600 mg   Cholecalciferol 50 MCG (2000 UT) Tabs Take 4,000 Units by mouth daily.   Colcrys 0.6 MG tablet Generic drug: colchicine Take 0.6 mg by mouth daily as needed (gout).   Eliquis 5 MG Tabs tablet Generic drug: apixaban TAKE 1 TABLET(5 MG) BY MOUTH TWICE DAILY   FreeStyle Freedom Lite w/Device Kit 1 each by Does not apply route in the morning and at bedtime. Use Freestyle lite meter to check blood sugar twice daily.   FREESTYLE LITE test strip Generic drug: glucose blood Use to test blood sugar twice daily. Dx: E11.9   gabapentin 100 MG capsule Commonly known as: NEURONTIN Take 100-200 mg by mouth at bedtime.   hydrochlorothiazide 25 MG tablet Commonly known as: HYDRODIURIL Take 1 tablet (25 mg total) by mouth daily. TAKE 1 TABLET(25 MG) BY MOUTH DAILY.   hydrocortisone cream 1 % Apply 1 application topically daily as needed for itching.   levothyroxine 150 MCG tablet Commonly known as: SYNTHROID Take 1 tablet (150 mcg total) by mouth daily.   magnesium 30 MG tablet Take 30 mg by mouth at bedtime.   metFORMIN 500 MG tablet Commonly known as: GLUCOPHAGE TAKE 1 TABLET BY MOUTH  EVERY MORNING AND 2 TABLETS EVERY EVENING   metoprolol succinate 50 MG 24 hr tablet Commonly known as: TOPROL-XL Take (1 tablet) 50 mg every morning. Take with or immediately following a meal. To be taken in addition to 25 mg tablet every evening.   metoprolol succinate 25 MG 24 hr tablet Commonly known as: TOPROL-XL Take 1 tablet (25 mg total) by mouth at bedtime.   molnupiravir EUA 200 MG Caps capsule Commonly known as: LAGEVRIO TAKE 4 CAPSULES (800 MG TOTAL) BY MOUTH 2 (TWO) TIMES DAILY FOR 5 DAYS.   NOVOLIN N FLEXPEN [place] Inject 30 Units into the skin daily. 15 units at morning and 18 units at night What changed: Another medication with the same name was changed. Make sure you understand how and when to take each.   NovoLIN N FlexPen ReliOn 100 UNIT/ML Kiwkpen Generic drug: Insulin NPH (Human) (Isophane) Inject 18 Units into the skin 3 (three) times daily. Take 18 units in  am and 15 units in pm What changed: additional instructions   OneTouch Delica Lancets 72I Misc Use to check blood sugars once daily   Ozempic (1 MG/DOSE) 2 MG/1.5ML Sopn Generic drug: Semaglutide (1 MG/DOSE) Inject 1 mg into the skin once a week.   pioglitazone 15 MG tablet Commonly known as: ACTOS TAKE 1 TABLET(15 MG) BY MOUTH DAILY   prednisoLONE acetate 1 % ophthalmic suspension Commonly known as: PRED FORTE Place 2 drops into the right eye daily.   timolol 0.5 % ophthalmic solution Commonly known as: TIMOPTIC Place 1 drop into both eyes daily.   vitamin B-12 1000 MCG tablet Commonly known as: CYANOCOBALAMIN Take 1,000 mcg by mouth daily.   vitamin C 500 MG tablet Commonly known as: ASCORBIC ACID Take 500 mg by mouth daily.        Allergies:  Allergies  Allergen Reactions   Pneumococcal Vaccine Anaphylaxis    weakness   Pneumovax 23 [Pneumococcal Vac Polyvalent] Anaphylaxis    weakness   Other Hives    Other reaction(s): Other (See Comments) Pneumonia  Vaccine- very ill   Sulfa  Antibiotics Hives   Pregabalin     Other reaction(s): depression    Past Medical History:  Diagnosis Date   Anemia    s/p Heme work -up normal EGD and colonscopy in 2012 per pt,neg SPEP   Anxiety    Aortic stenosis    mild by echo 07/2019   Bilateral carotid artery stenosis 06/08/2015   1-39% bilateral   Bradycardia    CKD (chronic kidney disease), stage III ([place])    Diabetes mellitus without complication (HCC)    type 2   GERD (gastroesophageal reflux disease)    Gout    HSV-1 (herpes simplex virus 1) infection    Acyclovir prn   Hyperkalemia    Hyperlipidemia    Hypertension    Joint pain    osteoarthritis by Xray- possible erosion of R 4th MCP,elevated uric  acid ,ANA +    Morbid obesity (HCC)    OSA on CPAP    Osteopenia    PAF (paroxysmal atrial fibrillation) ([place])    Pain management    Neurosurg: Dr Clydell Hakim   Pulmonary HTN ([place])    Moderate by echo  2016. PASP 57mHg   TIA (transient ischemic attack)    remote history of TIA's early 2000    Past Surgical History:  Procedure Laterality Date   ABDOMINAL HYSTERECTOMY     APPENDECTOMY     BACK SURGERY     cervical fusion   BLADDER SURGERY     CESAREAN SECTION     CHOLECYSTECTOMY      Family History  Problem Relation Age of Onset   Arrhythmia Brother     Social History:  reports that she has never smoked. She has never used smokeless tobacco. She reports that she does not drink alcohol and does not use drugs.  Review of Systems:   Hypertension:   This has been managed by her PCP and nephrologist She is taking amlodipine 5 mg, metoprolol 25 mg and HCTZ 25 mg  BP Readings from Last 3 Encounters:  09/10/21 (!) 142/80  02/03/21 (!) 151/68  08/08/20 (!) 140/58   Lab Results  Component Value Date   CREATININE 1.34 (H) 08/06/2020   CREATININE 1.31 (H) 05/27/2020   CREATININE 1.14 02/25/2020    Lipids: Triglycerides have been mostly high LDL has been below 100 with Lipitor 40 mg prescribed by  her cardiologist  She is also on 15 mg of Actos Last labs were again done nonfasting  Lab Results  Component Value Date   CHOL 144 08/06/2020   HDL 29.00 (L) 08/06/2020   LDLCALC 50 01/10/2018   LDLDIRECT 81.0 08/06/2020   TRIG 249.0 (H) 08/06/2020   CHOLHDL 5 08/06/2020    Renal insufficiency: Her creatinine levels are as follows:  Lisinopril caused hyperkalemia previously  Lab Results  Component Value Date   CREATININE 1.34 (H) 08/06/2020   CREATININE 1.31 (H) 05/27/2020   CREATININE 1.14 02/25/2020   Lab Results  Component Value Date   K 3.9 08/06/2020    She has hypothyroidism and is taking 150 mcg daily  She is regular with taking her levothyroxine before breakfast reportedly  She has some nonspecific fatigue TSH in December was about 7 Last TSH as follows  Lab Results  Component Value Date   TSH 1.07 08/06/2020   TSH 9.98 (H) 05/27/2020   TSH 3.32 08/16/2019   FREET4 1.34 08/06/2020   FREET4 1.01 05/27/2020   FREET4 1.15 12/13/2018     She has been on gabapentin 100 mg hs for mild burning and tingling in her feet and toes Followed by podiatrist also regularly   Examination:   BP (!) 142/80    Pulse (!) 128    Ht 5' 2"  (1.575 m)    Wt 219 lb 9.6 oz (99.6 kg)    SpO2 99%    BMI 40.17 kg/m   Body mass index is 40.17 kg/m.      ASSESSMENT/ PLAN:   Diabetes type 2 with obesity, insulin requiring:    See history of present illness for detailed discussion of  current management, blood sugar patterns and issues identified  Her A1c is now 9.9 as of last month She has not followed up for over a year No recent fructosamine available  Taking twice daily NPH and Ozempic 0.5 mg irregularly  She appears to have progression of her diabetes since she has much higher blood sugars overall Although her daughter thinks that she is missing her insulin doses as well as frequently late with Ozempic Blood sugar monitoring has been  minimal  Recommendations: Restart glucose monitoring and she will need to check her blood sugar at least twice a day especially fasting She will also call the DME supplier to get the freestyle libre sooner than next month  Discussed how basal insulin works, timing of injection, dosage She will start with 30 units once a day and replace NPH with TOUJEO Also discussed titration based on fasting blood sugar every 3 days by 2 units and at target of 90-150 for fasting reading.  Given a flowsheet with instructions on how to keep a record and adjust the doses  Co-pay card and brochure on insulin given Today discussed in detail the need for mealtime insulin to cover postprandial spikes, action of mealtime insulin, use of the insulin pen, timing and action of the rapid acting insulin as well as starting dose and dosage titration to target the two-hour reading of under 180 She will take 8 units with breakfast and 10 units with dinnertime preferably 30 minutes before eating Patient flyer on mealtime insulin doses was given If she has blood sugars over 180 she may need to increase the dose but also let us know if she starts getting low readings Her daughter will make sure she takes her Ozempic weekly on the same day Follow-up with diabetes educator next week   LIPIDS: Triglycerides  are again high with taking atorvastatin alone, this is prescribed by her cardiologist May be worse with poor diabetes control  HYPOTHYROIDISM: TSH is is relatively higher and she can take an extra tablet once a week of the 150 dose  Hypertension: She will continue to follow-up with PCP  RENAL dysfunction: Mild and stable, recently followed by PCP She does have diabetic nephropathy with higher microalbumin and this will need to be checked when blood sugars are better controlled  Follow-up in 1 month  Total visit time including counseling = 45 minutes  There are no Patient Instructions on file for this visit.   Elayne Snare 09/10/2021, 11:56 AM

## 2021-09-10 NOTE — Patient Instructions (Signed)
TOUJEO insulin: This insulin provides blood sugar control for up to 24 hours, REPLACES NOVOLIN R.    Start with 30 units at bedtime daily and increase by 2 units every 3 days until the waking up sugars are under 130. Then continue the same dose.   If blood sugar is under 90 for 2 days in a row, reduce the dose by 2 units.  Note that this insulin does not control the rise of blood sugar with meals    NOVOLIN R BEFORE MEALS WITH CARBS  Extra 1/2 thyroid pill weekly

## 2021-09-18 DIAGNOSIS — G4733 Obstructive sleep apnea (adult) (pediatric): Secondary | ICD-10-CM | POA: Diagnosis not present

## 2021-09-21 ENCOUNTER — Ambulatory Visit: Payer: HMO | Admitting: Nutrition

## 2021-09-23 ENCOUNTER — Other Ambulatory Visit: Payer: Self-pay | Admitting: Endocrinology

## 2021-09-25 DIAGNOSIS — D649 Anemia, unspecified: Secondary | ICD-10-CM | POA: Diagnosis not present

## 2021-09-25 DIAGNOSIS — G4700 Insomnia, unspecified: Secondary | ICD-10-CM | POA: Diagnosis not present

## 2021-09-25 DIAGNOSIS — E1121 Type 2 diabetes mellitus with diabetic nephropathy: Secondary | ICD-10-CM | POA: Diagnosis not present

## 2021-09-25 DIAGNOSIS — E11319 Type 2 diabetes mellitus with unspecified diabetic retinopathy without macular edema: Secondary | ICD-10-CM | POA: Diagnosis not present

## 2021-09-25 DIAGNOSIS — I4891 Unspecified atrial fibrillation: Secondary | ICD-10-CM | POA: Diagnosis not present

## 2021-09-25 DIAGNOSIS — E7800 Pure hypercholesterolemia, unspecified: Secondary | ICD-10-CM | POA: Diagnosis not present

## 2021-09-25 DIAGNOSIS — N183 Chronic kidney disease, stage 3 unspecified: Secondary | ICD-10-CM | POA: Diagnosis not present

## 2021-09-25 DIAGNOSIS — I5042 Chronic combined systolic (congestive) and diastolic (congestive) heart failure: Secondary | ICD-10-CM | POA: Diagnosis not present

## 2021-09-25 DIAGNOSIS — F330 Major depressive disorder, recurrent, mild: Secondary | ICD-10-CM | POA: Diagnosis not present

## 2021-09-25 DIAGNOSIS — I10 Essential (primary) hypertension: Secondary | ICD-10-CM | POA: Diagnosis not present

## 2021-09-25 DIAGNOSIS — E1165 Type 2 diabetes mellitus with hyperglycemia: Secondary | ICD-10-CM | POA: Diagnosis not present

## 2021-09-25 DIAGNOSIS — E039 Hypothyroidism, unspecified: Secondary | ICD-10-CM | POA: Diagnosis not present

## 2021-10-08 ENCOUNTER — Ambulatory Visit: Payer: HMO | Admitting: Endocrinology

## 2021-10-16 DIAGNOSIS — E7800 Pure hypercholesterolemia, unspecified: Secondary | ICD-10-CM | POA: Diagnosis not present

## 2021-10-16 DIAGNOSIS — E1165 Type 2 diabetes mellitus with hyperglycemia: Secondary | ICD-10-CM | POA: Diagnosis not present

## 2021-10-16 DIAGNOSIS — N183 Chronic kidney disease, stage 3 unspecified: Secondary | ICD-10-CM | POA: Diagnosis not present

## 2021-10-16 DIAGNOSIS — G4700 Insomnia, unspecified: Secondary | ICD-10-CM | POA: Diagnosis not present

## 2021-10-16 DIAGNOSIS — E039 Hypothyroidism, unspecified: Secondary | ICD-10-CM | POA: Diagnosis not present

## 2021-10-16 DIAGNOSIS — I4891 Unspecified atrial fibrillation: Secondary | ICD-10-CM | POA: Diagnosis not present

## 2021-10-16 DIAGNOSIS — I10 Essential (primary) hypertension: Secondary | ICD-10-CM | POA: Diagnosis not present

## 2021-10-20 DIAGNOSIS — Z7901 Long term (current) use of anticoagulants: Secondary | ICD-10-CM | POA: Diagnosis not present

## 2021-10-20 DIAGNOSIS — I4891 Unspecified atrial fibrillation: Secondary | ICD-10-CM | POA: Diagnosis not present

## 2021-10-20 DIAGNOSIS — H169 Unspecified keratitis: Secondary | ICD-10-CM | POA: Diagnosis not present

## 2021-10-20 DIAGNOSIS — I10 Essential (primary) hypertension: Secondary | ICD-10-CM | POA: Diagnosis not present

## 2021-10-20 DIAGNOSIS — I252 Old myocardial infarction: Secondary | ICD-10-CM | POA: Diagnosis not present

## 2021-10-21 ENCOUNTER — Other Ambulatory Visit: Payer: Self-pay | Admitting: Cardiology

## 2021-10-21 ENCOUNTER — Encounter: Payer: Self-pay | Admitting: *Deleted

## 2021-10-21 ENCOUNTER — Other Ambulatory Visit: Payer: Self-pay | Admitting: Endocrinology

## 2021-10-21 DIAGNOSIS — I350 Nonrheumatic aortic (valve) stenosis: Secondary | ICD-10-CM

## 2021-10-21 DIAGNOSIS — G4733 Obstructive sleep apnea (adult) (pediatric): Secondary | ICD-10-CM

## 2021-10-21 DIAGNOSIS — I6523 Occlusion and stenosis of bilateral carotid arteries: Secondary | ICD-10-CM

## 2021-10-21 DIAGNOSIS — I480 Paroxysmal atrial fibrillation: Secondary | ICD-10-CM

## 2021-10-21 DIAGNOSIS — I272 Pulmonary hypertension, unspecified: Secondary | ICD-10-CM

## 2021-10-21 DIAGNOSIS — I10 Essential (primary) hypertension: Secondary | ICD-10-CM

## 2021-10-26 ENCOUNTER — Telehealth: Payer: Self-pay | Admitting: Cardiology

## 2021-10-26 MED ORDER — APIXABAN 5 MG PO TABS: ORAL_TABLET | ORAL | 0 refills | Status: DC

## 2021-10-26 NOTE — Telephone Encounter (Signed)
°*  STAT* If patient is at the pharmacy, call can be transferred to refill team.   1. Which medications need to be refilled? (please list name of each medication and dose if known)  ELIQUIS 5 MG TABS tablet   2. Which pharmacy/location (including street and city if local pharmacy) is medication to be sent to?[facility] - [place], [place] - [address]  Phone:  [phone] Fax:  [phone]  3. Do they need a 30 day or 90 day supply? 90 ds

## 2021-10-26 NOTE — Telephone Encounter (Signed)
Age 81, weight 100kg, SCr 1.2 on 08/19/21, on correct dose of Eliquis for afib but pt has not been seen by provider since 06/2020. Does have f/u scheduled 11/20/21. Will send in 1 refill (90 day as requested ok as pt gets better copay rate for 90 day vs 30 day supply).

## 2021-10-27 ENCOUNTER — Telehealth: Payer: Self-pay | Admitting: Diagnostic Neuroimaging

## 2021-10-27 ENCOUNTER — Encounter: Payer: Self-pay | Admitting: Diagnostic Neuroimaging

## 2021-10-27 ENCOUNTER — Ambulatory Visit: Payer: HMO | Admitting: Diagnostic Neuroimaging

## 2021-10-27 VITALS — BP 137/70 | HR 80 | Ht 61.5 in | Wt 214.0 lb

## 2021-10-27 DIAGNOSIS — R413 Other amnesia: Secondary | ICD-10-CM | POA: Diagnosis not present

## 2021-10-27 NOTE — Progress Notes (Signed)
[facility]  PATIENT: Tina Hester DOB: 1941-03-26  REFERRING CLINICIAN: Jonathon Jordan, MD HISTORY FROM: patient  REASON FOR VISIT: new consult   HISTORICAL  CHIEF COMPLAINT:  Chief Complaint  Patient presents with   Cognitive impairment    Rm 7 New Pt  dgtr- [name]  MMSE 20    HISTORY OF PRESENT ILLNESS:   81 year old female here for evaluation of mild memory loss.  Mild changes noted in the last 6 to 12 months.  Changes in ADLs.  Needs some help with major financial issues, medications, transportation, food preparation and shopping.  Patient repeating himself and having some short-term memory loss.  Noted by patient and daughter.   REVIEW OF SYSTEMS: Full 14 system review of systems performed and negative with exception of: as per HPI.  ALLERGIES: Allergies  Allergen Reactions   Pneumococcal Vaccine Anaphylaxis    weakness   Pneumovax 23 [Pneumococcal Vac Polyvalent] Anaphylaxis    weakness   Other Hives    Other reaction(s): Other (See Comments) Pneumonia  Vaccine- very ill   Sulfa Antibiotics Hives   Pregabalin     Other reaction(s): depression    HOME MEDICATIONS: Outpatient Medications Prior to Visit  Medication Sig Dispense Refill   acetaminophen (TYLENOL) 325 MG tablet Take by mouth every 6 (six) hours as needed for mild pain or moderate pain.     acyclovir (ZOVIRAX) 400 MG tablet Take 400 mg by mouth 2 (two) times daily.      ALPRAZolam (XANAX) 0.25 MG tablet Take 0.25 mg by mouth at bedtime as needed for anxiety.     apixaban (ELIQUIS) 5 MG TABS tablet TAKE 1 TABLET(5 MG) BY MOUTH TWICE DAILY 180 tablet 0   atorvastatin (LIPITOR) 40 MG tablet TAKE ONE TABLET BY MOUTH EVERY EVENING. Please keep upcoming appointment in March 2023 for future refills. Thank you 90 tablet 0   atropine 1 % ophthalmic ointment 1 application 2 (two) times daily.     Blood Glucose Monitoring Suppl (FREESTYLE FREEDOM LITE) w/Device KIT 1 each by Does not  apply route in the morning and at bedtime. Use Freestyle lite meter to check blood sugar twice daily. 1 kit 1   CALCIUM PO Take 1 tablet by mouth daily. 600 mg     doxycycline (VIBRAMYCIN) 100 MG capsule Take by mouth.     erythromycin ophthalmic ointment 1 application 4 (four) times daily.     gabapentin (NEURONTIN) 100 MG capsule Take 100-200 mg by mouth at bedtime.      hydrochlorothiazide (HYDRODIURIL) 25 MG tablet Take 1 tablet (25 mg total) by mouth every morning. Please keep upcoming appointment in March 2023 for future refills. Thank you 90 tablet 0   hydrocortisone cream 1 % Apply 1 application topically daily as needed for itching.     insulin glargine, 1 Unit Dial, (TOUJEO SOLOSTAR) 300 UNIT/ML Solostar Pen Inject 30 Units into the skin daily. 4.5 mL 3   Insulin NPH, Human,, Isophane, (NOVOLIN N FLEXPEN) 100 UNIT/ML Kiwkpen Inject 15 units at morning and 18 units at night 15 mL 2   magnesium 30 MG tablet Take 30 mg by mouth at bedtime.     metFORMIN (GLUCOPHAGE) 500 MG tablet TAKE ONE TABLET BY MOUTH EVERY MORNING and TAKE TWO TABLETS BY MOUTH EVERY EVENING 270 tablet 0   metoprolol succinate (TOPROL-XL) 25 MG 24 hr tablet Take 1 tablet (25 mg total) by mouth every evening. Please keep uupcoming appointment in March 2023 for future refills.  Thank you 90 tablet 0   metoprolol succinate (TOPROL-XL) 50 MG 24 hr tablet TAKE ONE TABLET BY MOUTH EVERY MORNING. Please keep upcoming appointment in March 2023 for future refills. Thank you 90 tablet 0   NEEDLE, DISP, 30 G (BD DISP NEEDLES) 30G X 1/2" MISC 1 each by Does not apply route 2 (two) times daily before a meal. 100 each 5   ONETOUCH DELICA LANCETS 47W MISC Use to check blood sugars once daily 50 each 3   OZEMPIC, 0.25 OR 0.5 MG/DOSE, 2 MG/1.5ML SOPN INJECT 0.5 MG SUBCUTANEOUSLY ONCE A WEEK 4.5 mL 0   pioglitazone (ACTOS) 15 MG tablet TAKE ONE TABLET BY MOUTH EVERY MORNING 90 tablet 0   Semaglutide, 1 MG/DOSE, (OZEMPIC, 1 MG/DOSE,) 2  MG/1.5ML SOPN Inject 1 mg into the skin once a week. 1.5 mL 2   valACYclovir (VALTREX) 1000 MG tablet Take by mouth.     vitamin C (ASCORBIC ACID) 500 MG tablet Take 500 mg by mouth daily.     amLODipine (NORVASC) 5 MG tablet Take 1 tablet (5 mg total) by mouth every morning. Please keep upcoming appointment for future refills. Thank you (Patient not taking: Reported on 10/27/2021) 90 tablet 0   Cholecalciferol 2000 units TABS Take 4,000 Units by mouth daily. (Patient not taking: Reported on 10/27/2021)     COLCRYS 0.6 MG tablet Take 0.6 mg by mouth daily as needed (gout).  (Patient not taking: Reported on 10/27/2021)     glucose blood (FREESTYLE LITE) test strip Use to test blood sugar twice daily. Dx: E11.9 (Patient not taking: Reported on 10/27/2021) 200 each 1   levothyroxine (SYNTHROID) 150 MCG tablet TAKE ONE TABLET BY MOUTH BEFORE BREAKFAST (Patient not taking: Reported on 10/27/2021) 90 tablet 0   prednisoLONE acetate (PRED FORTE) 1 % ophthalmic suspension Place 2 drops into the right eye daily.  (Patient not taking: Reported on 09/10/2021)  2   timolol (TIMOPTIC) 0.5 % ophthalmic solution Place 1 drop into both eyes daily.  (Patient not taking: Reported on 09/10/2021)  1   vitamin B-12 (CYANOCOBALAMIN) 1000 MCG tablet Take 1,000 mcg by mouth daily. (Patient not taking: Reported on 09/10/2021)     No facility-administered medications prior to visit.    PAST MEDICAL HISTORY: Past Medical History:  Diagnosis Date   Anemia    s/p Heme work -up normal EGD and colonscopy in 2012 per pt,neg SPEP   Anxiety    Aortic stenosis    mild by echo 07/2019   Bilateral carotid artery stenosis 06/08/2015   1-39% bilateral   Bradycardia    CKD (chronic kidney disease), stage III ([place])    Diabetes mellitus without complication (HCC)    type 2   GERD (gastroesophageal reflux disease)    Gout    HSV-1 (herpes simplex virus 1) infection    Acyclovir prn   Hyperkalemia    Hyperlipidemia    Hypertension     Joint pain    osteoarthritis by Xray- possible erosion of R 4th MCP,elevated uric  acid ,ANA +    MCI (mild cognitive impairment)    Morbid obesity (HCC)    OSA on CPAP    Osteopenia    PAF (paroxysmal atrial fibrillation) ([place])    Pain management    Neurosurg: Dr Clydell Hakim   Pulmonary HTN ([place])    Moderate by echo  2016. PASP 61mHg   TIA (transient ischemic attack)    remote history of TIA's early 2000  PAST SURGICAL HISTORY: Past Surgical History:  Procedure Laterality Date   ABDOMINAL HYSTERECTOMY     APPENDECTOMY     BACK SURGERY     cervical fusion   BLADDER SURGERY     CESAREAN SECTION     CHOLECYSTECTOMY      FAMILY HISTORY: Family History  Problem Relation Age of Onset   Arrhythmia Brother     SOCIAL HISTORY: Social History   Socioeconomic History   Marital status: Divorced    Spouse name: Not on file   Number of children: 4   Years of education: Not on file   Highest education level: Some college, no degree  Occupational History   Not on file  Tobacco Use   Smoking status: Never   Smokeless tobacco: Never  Vaping Use   Vaping Use: Never used  Substance and Sexual Activity   Alcohol use: No   Drug use: No   Sexual activity: Not on file  Other Topics Concern   Not on file  Social History Narrative   10/27/21 lives with daughter, Ishmael Holter   Some soda   Social Determinants of Health   Financial Resource Strain: Not on file  Food Insecurity: Not on file  Transportation Needs: Not on file  Physical Activity: Not on file  Stress: Not on file  Social Connections: Not on file  Intimate Partner Violence: Not on file     PHYSICAL EXAM  GENERAL EXAM/CONSTITUTIONAL: Vitals:  Vitals:   10/27/21 1350  BP: 137/70  Pulse: 80  Weight: 214 lb (97.1 kg)  Height: 5' 1.5" (1.562 m)   Body mass index is 39.78 kg/m. Wt Readings from Last 3 Encounters:  10/27/21 214 lb (97.1 kg)  09/10/21 219 lb 9.6 oz (99.6 kg)  02/03/21 216 lb (98 kg)    Patient is in no distress; well developed, nourished and groomed; neck is supple  CARDIOVASCULAR: Examination of carotid arteries is normal; no carotid bruits Regular rate and rhythm, no murmurs Examination of peripheral vascular system by observation and palpation is normal   MUSCULOSKELETAL: Gait, strength, tone, movements noted in Neurologic exam below  NEUROLOGIC: MENTAL STATUS:  MMSE - Mini Mental State Exam 10/27/2021  Orientation to time 3  Orientation to Place 4  Registration 3  Attention/ Calculation 0  Recall 2  Language- name 2 objects 2  Language- repeat 1  Language- follow 3 step command 3  Language- read & follow direction 1  Write a sentence 1  Copy design 0  Total score 20   awake, alert, oriented to person, place and time recent and remote memory intact normal attention and concentration language fluent, comprehension intact, naming intact fund of knowledge appropriate  CRANIAL NERVE:  2nd, 3rd, 4th, 6th - right eye post-surg; cloudy; red sclera; left pupil reactive; visual fields full to confrontation, extraocular muscles intact, no nystagmus 5th - facial sensation symmetric 7th - facial strength symmetric 8th - hearing intact 9th - palate elevates symmetrically, uvula midline 11th - shoulder shrug symmetric 12th - tongue protrusion midline  MOTOR:  normal bulk and tone, full strength in the BUE, BLE  SENSORY:  normal and symmetric to light touch, temperature, vibration  COORDINATION:  finger-nose-finger, fine finger movements normal  REFLEXES:  deep tendon reflexes TRACE and symmetric  GAIT/STATION:  narrow based gait; USING WALKER     DIAGNOSTIC DATA (LABS, IMAGING, TESTING) - I reviewed patient records, labs, notes, testing and imaging myself where available.  Lab Results  Component Value Date  WBC 7.4 04/11/2018   HGB 11.2 (L) 04/11/2018   HCT 35.7 (L) 04/11/2018   MCV 94.9 04/11/2018   PLT 188 04/11/2018      Component  Value Date/Time   NA 139 08/06/2020 1524   NA 141 02/02/2018 1051   K 3.9 08/06/2020 1524   CL 100 08/06/2020 1524   CO2 31 08/06/2020 1524   GLUCOSE 103 (H) 08/06/2020 1524   BUN 21 08/06/2020 1524   BUN 36 (H) 02/02/2018 1051   CREATININE 1.34 (H) 08/06/2020 1524   CALCIUM 9.4 08/06/2020 1524   PROT 7.3 08/06/2020 1524   ALBUMIN 3.9 08/06/2020 1524   AST 19 08/06/2020 1524   ALT 16 08/06/2020 1524   ALKPHOS 73 08/06/2020 1524   BILITOT 0.6 08/06/2020 1524   GFRNONAA 39 (L) 04/11/2018 1137   GFRAA 45 (L) 04/11/2018 1137   Lab Results  Component Value Date   CHOL 144 08/06/2020   HDL 29.00 (L) 08/06/2020   LDLCALC 50 01/10/2018   LDLDIRECT 81.0 08/06/2020   TRIG 249.0 (H) 08/06/2020   CHOLHDL 5 08/06/2020   Lab Results  Component Value Date   HGBA1C 7.2 (H) 05/27/2020   No results found for: VITAMINB12 Lab Results  Component Value Date   TSH 1.07 08/06/2020    08/06/21 labs B12 238 TSH 7.65 A1c 9.9    ASSESSMENT AND PLAN  81 y.o. year old female here with mild short term memory loss, word finding, navigation issues, diff with time relationships and ADLs decline since 2022.   Dx:  1. Memory loss     PLAN:  MILD MEMORY LOSS (suspected mild dementia) - check MRI brain - optimize diabetes, B12, thyroid function - consider memantine 36m at bedtime; increase to twice a day after 1-2 weeks - safety / supervision issues reviewed - daily physical activity / exercise (at least 15-30 minutes) - eat more plants / vegetables - increase social activities, brain stimulation, games, puzzles, hobbies, crafts, arts, music - aim for at least 7-8 hours sleep per night (or more) - avoid smoking and alcohol - caregiver resources provided - caution with medications, finances; no driving  Orders Placed This Encounter  Procedures   MR BRAIN WO CONTRAST   Return for pending if symptoms worsen or fail to improve, pending test results.    VPenni Bombard MD  20/27/1423 22:00PM Certified in Neurology, Neurophysiology and Neuroimaging  [facility] [address] [place] [phone]

## 2021-10-27 NOTE — Telephone Encounter (Signed)
HTA order sent to GI, NPR they will reach out to the patient to schedule.  

## 2021-10-27 NOTE — Patient Instructions (Signed)
MILD MEMORY LOSS (mild cognitive impairment vs mild dementia) - check MRI brain - optimize diabetes, B12, thyroid function - consider memantine 10mg  at bedtime; increase to twice a day after 1-2 weeks - safety / supervision issues reviewed - daily physical activity / exercise (at least 15-30 minutes) - eat more plants / vegetables - increase social activities, brain stimulation, games, puzzles, hobbies, crafts, arts, music - aim for at least 7-8 hours sleep per night (or more) - avoid smoking and alcohol - caregiver resources provided - caution with medications, finances; no driving

## 2021-10-28 ENCOUNTER — Encounter: Payer: Self-pay | Admitting: Diagnostic Neuroimaging

## 2021-11-17 ENCOUNTER — Ambulatory Visit
Admission: RE | Admit: 2021-11-17 | Discharge: 2021-11-17 | Disposition: A | Discharge status: Home / Self Care | Payer: HMO | Source: Ambulatory Visit | Attending: Diagnostic Neuroimaging | Admitting: Diagnostic Neuroimaging

## 2021-11-17 ENCOUNTER — Other Ambulatory Visit: Payer: Self-pay

## 2021-11-17 DIAGNOSIS — R413 Other amnesia: Secondary | ICD-10-CM | POA: Diagnosis not present

## 2021-11-20 ENCOUNTER — Ambulatory Visit (INDEPENDENT_AMBULATORY_CARE_PROVIDER_SITE_OTHER): Payer: HMO | Admitting: Cardiology

## 2021-11-20 ENCOUNTER — Telehealth: Payer: Self-pay | Admitting: *Deleted

## 2021-11-20 ENCOUNTER — Encounter: Payer: Self-pay | Admitting: Cardiology

## 2021-11-20 ENCOUNTER — Other Ambulatory Visit: Payer: Self-pay

## 2021-11-20 VITALS — BP 116/62 | HR 115 | Ht 61.5 in | Wt 216.0 lb

## 2021-11-20 DIAGNOSIS — I4819 Other persistent atrial fibrillation: Secondary | ICD-10-CM

## 2021-11-20 DIAGNOSIS — E7800 Pure hypercholesterolemia, unspecified: Secondary | ICD-10-CM

## 2021-11-20 DIAGNOSIS — G4733 Obstructive sleep apnea (adult) (pediatric): Secondary | ICD-10-CM | POA: Diagnosis not present

## 2021-11-20 DIAGNOSIS — Z9989 Dependence on other enabling machines and devices: Secondary | ICD-10-CM

## 2021-11-20 DIAGNOSIS — I6523 Occlusion and stenosis of bilateral carotid arteries: Secondary | ICD-10-CM

## 2021-11-20 DIAGNOSIS — I359 Nonrheumatic aortic valve disorder, unspecified: Secondary | ICD-10-CM | POA: Diagnosis not present

## 2021-11-20 DIAGNOSIS — I10 Essential (primary) hypertension: Secondary | ICD-10-CM

## 2021-11-20 MED ORDER — METOPROLOL TARTRATE 50 MG PO TABS: 50.0000 mg | ORAL_TABLET | Freq: Two times a day (BID) | ORAL | 3 refills | Status: DC

## 2021-11-20 NOTE — Telephone Encounter (Signed)
4 week reminder made in epic. ?

## 2021-11-20 NOTE — Addendum Note (Signed)
Addended by: Antonieta Iba on: 11/20/2021 10:50 AM ? ? Modules accepted: Orders ? ?

## 2021-11-20 NOTE — Telephone Encounter (Signed)
-----   Message from Antonieta Iba, RN sent at 11/20/2021 11:52 AM EDT ----- ?Dr. Radford Pax would like to repeat a download in 4 weeks.  ?Thanks! ? ?

## 2021-11-20 NOTE — Patient Instructions (Addendum)
Medication Instructions:  ?Your physician has recommended you make the following change in your medication:  ?1) INCREASE Lopressor (metoprolol) to 50 mg twice daily ? ?*If you need a refill on your cardiac medications before your next appointment, please call your pharmacy* ? ?Lab Work: ?Come back for fasting lipids and ALT ?If you have labs (blood work) drawn today and your tests are completely normal, you will receive your results only by: ?MyChart Message (if you have MyChart) OR ?A paper copy in the mail ?If you have any lab test that is abnormal or we need to change your treatment, we will call you to review the results. ? ?Testing/Procedures: ?Your physician has requested that you have an echocardiogram. Echocardiography is a painless test that uses sound waves to create images of your heart. It provides your doctor with information about the size and shape of your heart and how well your heart?s chambers and valves are working. This procedure takes approximately one hour. There are no restrictions for this procedure. ? ?Follow-Up: ?At [facility], you and your health needs are our priority.  As part of our continuing mission to provide you with exceptional heart care, we have created designated Provider Care Teams.  These Care Teams include your primary Cardiologist (physician) and Advanced Practice Providers (APPs -  Physician Assistants and Nurse Practitioners) who all work together to provide you with the care you need, when you need it.  ? ?Your next appointment:   ?6 month(s) ? ?The format for your next appointment:   ?In Person ? ?Provider:   ?Fransico Him, MD   ? ?Follow back up with Dr. Curt Bears ?

## 2021-11-20 NOTE — Progress Notes (Signed)
? ? ?Date:  11/20/2021  ? ?ID:  Tina Hester, DOB [birth year], MRN [id number] ?The patient was identified using 2 identifiers. ?Date:  11/20/2021  ? ?ID:  Tina Hester, DOB 12-21-1940, MRN [id number] ? ? ?PCP:  Jonathon Jordan, MD  ?Cardiologist:  Fransico Him, MD  ?Electrophysiologist:  Will Meredith Leeds, MD  ? ?Chief Complaint:  Atrial fibrillation, OSA, HTN ? ?History of Present Illness:   ? ?Tina Hester is a 81y.o. female with a hx of PAF, HTN, bilateral carotid artery stenosis, aortic stenosis, morbid obesity and OSA on CPAP.    ? ?She is here today for followup and is doing well.  She denies any chest pain or pressure, SOB, DOE (except with extreme exertion), PND, orthopnea, LE edema, dizziness, palpitations or syncope. She is compliant with her meds and is tolerating meds with no SE.    ? ?She is doing well with her CPAP device and thinks that she has gotten used to it.  She tolerates the mask and feels the pressure is adequate.  Since going on CPAP she feels rested in the am and has no significant daytime sleepiness.  She denies any significant mouth or nasal dryness or nasal congestion.  She does not think that he snores.    ? ?Prior CV studies:   ?The following studies were reviewed today: ?PAP compliance download, EKG ? ?Past Medical History:  ?Diagnosis Date  ? Anemia   ? s/p Heme work -up normal EGD and colonscopy in 2012 per pt,neg SPEP  ? Anxiety   ? Aortic stenosis   ? mild by echo 07/2019  ? Bilateral carotid artery stenosis 06/08/2015  ? 1-39% bilateral  ? Bradycardia   ? CKD (chronic kidney disease), stage III ([place])   ? Diabetes mellitus without complication ([place])   ? type 2  ? GERD (gastroesophageal reflux disease)   ? Gout   ? HSV-1 (herpes simplex virus 1) infection   ? Acyclovir prn  ? Hyperkalemia   ? Hyperlipidemia   ? Hypertension   ? Joint pain   ? osteoarthritis by Xray- possible erosion of R 4th MCP,elevated uric  acid ,ANA +   ? MCI (mild cognitive impairment)   ? Morbid  obesity ([place])   ? OSA on CPAP   ? Osteopenia   ? PAF (paroxysmal atrial fibrillation) ([place])   ? Pain management   ? Neurosurg: Dr Clydell Hakim  ? Pulmonary HTN ([place])   ? Moderate by echo  2016. PASP 40mHg  ? TIA (transient ischemic attack)   ? remote history of TIA's early 2000  ? ?Past Surgical History:  ?Procedure Laterality Date  ? ABDOMINAL HYSTERECTOMY    ? APPENDECTOMY    ? BACK SURGERY    ? cervical fusion  ? BLADDER SURGERY    ? CESAREAN SECTION    ? CHOLECYSTECTOMY    ?  ? ?Current Meds  ?Medication Sig  ? acetaminophen (TYLENOL) 325 MG tablet Take by mouth every 6 (six) hours as needed for mild pain or moderate pain.  ? acyclovir (ZOVIRAX) 400 MG tablet Take 400 mg by mouth 2 (two) times daily.   ? ALPRAZolam (XANAX) 0.25 MG tablet Take 0.25 mg by mouth at bedtime as needed for anxiety.  ? apixaban (ELIQUIS) 5 MG TABS tablet TAKE 1 TABLET(5 MG) BY MOUTH TWICE DAILY  ? atorvastatin (LIPITOR) 40 MG tablet TAKE ONE TABLET BY MOUTH EVERY EVENING. Please keep upcoming appointment in March 2023 for future refills. Thank you  ?  atropine 1 % ophthalmic ointment 1 application 2 (two) times daily.  ? Blood Glucose Monitoring Suppl (FREESTYLE FREEDOM LITE) w/Device KIT 1 each by Does not apply route in the morning and at bedtime. Use Freestyle lite meter to check blood sugar twice daily.  ? CALCIUM PO Take 1 tablet by mouth daily. 600 mg  ? Cholecalciferol 2000 units TABS Take 4,000 Units by mouth daily.  ? COLCRYS 0.6 MG tablet Take 0.6 mg by mouth daily as needed (gout).  ? erythromycin ophthalmic ointment 1 application 4 (four) times daily.  ? gabapentin (NEURONTIN) 100 MG capsule Take 100-200 mg by mouth at bedtime.   ? hydrochlorothiazide (HYDRODIURIL) 25 MG tablet Take 1 tablet (25 mg total) by mouth every morning. Please keep upcoming appointment in March 2023 for future refills. Thank you  ? hydrocortisone cream 1 % Apply 1 application topically daily as needed for itching.  ? insulin glargine, 1 Unit Dial,  (TOUJEO SOLOSTAR) 300 UNIT/ML Solostar Pen Inject 30 Units into the skin daily.  ? levothyroxine (SYNTHROID) 150 MCG tablet TAKE ONE TABLET BY MOUTH BEFORE BREAKFAST  ? magnesium 30 MG tablet Take 30 mg by mouth at bedtime.  ? metFORMIN (GLUCOPHAGE) 500 MG tablet TAKE ONE TABLET BY MOUTH EVERY MORNING and TAKE TWO TABLETS BY MOUTH EVERY EVENING  ? metoprolol succinate (TOPROL-XL) 25 MG 24 hr tablet Take 1 tablet (25 mg total) by mouth every evening. Please keep uupcoming appointment in March 2023 for future refills. Thank you  ? metoprolol succinate (TOPROL-XL) 50 MG 24 hr tablet TAKE ONE TABLET BY MOUTH EVERY MORNING. Please keep upcoming appointment in March 2023 for future refills. Thank you  ? NEEDLE, DISP, 30 G (BD DISP NEEDLES) 30G X 1/2" MISC 1 each by Does not apply route 2 (two) times daily before a meal.  ? ONETOUCH DELICA LANCETS 78G MISC Use to check blood sugars once daily  ? OZEMPIC, 0.25 OR 0.5 MG/DOSE, 2 MG/1.5ML SOPN INJECT 0.5 MG SUBCUTANEOUSLY ONCE A WEEK  ? pioglitazone (ACTOS) 15 MG tablet TAKE ONE TABLET BY MOUTH EVERY MORNING  ?  ? ?Allergies:   Pneumococcal vaccine, Pneumovax 23 [pneumococcal vac polyvalent], Other, Sulfa antibiotics, and Pregabalin  ? ?Social History  ? ?Tobacco Use  ? Smoking status: Never  ? Smokeless tobacco: Never  ?Vaping Use  ? Vaping Use: Never used  ?Substance Use Topics  ? Alcohol use: No  ? Drug use: No  ?  ? ?Family Hx: ?The patient's family history includes Arrhythmia in her brother. ? ?ROS:   ?Please see the history of present illness.    ? ?All other systems reviewed and are negative. ? ? ?Labs/Other Tests and Data Reviewed:   ? ?Recent Labs: ?No results found for requested labs within last 8760 hours.  ? ?Recent Lipid Panel ?Lab Results  ?Component Value Date/Time  ? CHOL 144 08/06/2020 03:24 PM  ? TRIG 249.0 (H) 08/06/2020 03:24 PM  ? HDL 29.00 (L) 08/06/2020 03:24 PM  ? CHOLHDL 5 08/06/2020 03:24 PM  ? [place] 50 01/10/2018 08:31 AM  ? LDLDIRECT 81.0  08/06/2020 03:24 PM  ? ? ?Wt Readings from Last 3 Encounters:  ?11/20/21 216 lb (98 kg)  ?10/27/21 214 lb (97.1 kg)  ?09/10/21 219 lb 9.6 oz (99.6 kg)  ?  ? ?Objective:   ? ?Vital Signs:  Ht 5' 1.5" (1.562 m)   Wt 216 lb (98 kg)   BMI 40.15 kg/m?   ?GEN: Well nourished, well developed in no acute distress ?  HEENT: Normal ?NECK: No JVD; No carotid bruits ?LYMPHATICS: No lymphadenopathy ?CARDIAC:irregularly irregular, no murmurs, rubs, gallops ?RESPIRATORY:  Clear to auscultation without rales, wheezing or rhonchi  ?ABDOMEN: Soft, non-tender, non-distended ?MUSCULOSKELETAL:  No edema; No deformity  ?SKIN: Warm and dry ?NEUROLOGIC:  Alert and oriented x 3 ?PSYCHIATRIC:  Normal affect   ? ?ASSESSMENT & PLAN:   ? ?1.  Persistent atrial fibrillation/atrial flutter ?-she failed Multaq.   ?-She was evaluated by Dr. Lennie Odor and was doing better and wanted to hold off on afib ablation ?-She is back in afib with RVR ?-She has not had any problems with bleeding on DOAC ?-increase Toprol XL to 85m BID for better HR control ?-Continue prescription drug management with Eliquis 5 mg twice daily with as needed refills (weight greater than 60 kg and creatinine < 1.5).   ?-I have personally reviewed and interpreted outside labs performed by patient's PCP which showed hemoglobin 12.8 and serum creatinine 1.2 on 09/10/2020 ?-I will get her in with Dr. CCurt Bearsgiven her recurrent afib ? ?2.  Hypertension  ?-BP is well controlled on exam today ?-Continue prescription drug management with HCTZ 25 mg daily and amlodipine 5 mg daily with as needed refills ?-increasing Toprol XL 576mBID for HR control ? ?3.  Bilateral carotid artery stenosis  ?-her last carotid Dopplers in 2018 showed mild carotid stenosis 1 to 39% but repeat dopplers 01/2019 essentially normal ?-continue statin ? ?4.  Mild aortic stenosis  ?-her last 2D echo was in 2020 showing a mean aortic valve gradient 1425m ?-Repeat 2D echo to reassess gradient ? ?5.  Hyperlipidemia   ?-LDL goal is less than 100 ?-Repeat FLP and ALT ?-Continue prescription drug management with atorvastatin 40 mg daily with as needed refills ? ?6.  OSA - The patient is tolerating PAP therapy well without a

## 2021-11-25 ENCOUNTER — Telehealth: Payer: Self-pay | Admitting: Cardiology

## 2021-11-25 DIAGNOSIS — I10 Essential (primary) hypertension: Secondary | ICD-10-CM | POA: Diagnosis not present

## 2021-11-25 DIAGNOSIS — E1121 Type 2 diabetes mellitus with diabetic nephropathy: Secondary | ICD-10-CM | POA: Diagnosis not present

## 2021-11-25 MED ORDER — METOPROLOL TARTRATE 50 MG PO TABS: 50.0000 mg | ORAL_TABLET | Freq: Two times a day (BID) | ORAL | 3 refills | Status: DC

## 2021-11-25 NOTE — Telephone Encounter (Signed)
?*  STAT* If patient is at the pharmacy, call can be transferred to refill team. ? ? ?1. Which medications need to be refilled? (please list name of each medication and dose if known)  ?metoprolol tartrate (LOPRESSOR) 50 MG tablet ? ? ?2. Which pharmacy/location (including street and city if local pharmacy) is medication to be sent to? ? ?[facility] - [place], [place] - [place] [address] ?P: [phone] ?F: [phone] ? ?3. Do they need a 30 day or 90 day supply? 90 ds ? ?

## 2021-11-25 NOTE — Telephone Encounter (Signed)
Pt's medication was sent to pt's pharmacy as requested. Confirmation received.  °

## 2021-11-30 DIAGNOSIS — Z794 Long term (current) use of insulin: Secondary | ICD-10-CM | POA: Diagnosis not present

## 2021-11-30 DIAGNOSIS — Z7984 Long term (current) use of oral hypoglycemic drugs: Secondary | ICD-10-CM | POA: Diagnosis not present

## 2021-11-30 DIAGNOSIS — E119 Type 2 diabetes mellitus without complications: Secondary | ICD-10-CM | POA: Diagnosis not present

## 2021-12-18 ENCOUNTER — Ambulatory Visit (HOSPITAL_COMMUNITY): Payer: HMO | Attending: Internal Medicine

## 2021-12-18 ENCOUNTER — Other Ambulatory Visit: Payer: HMO | Admitting: *Deleted

## 2021-12-18 DIAGNOSIS — E7800 Pure hypercholesterolemia, unspecified: Secondary | ICD-10-CM

## 2021-12-18 DIAGNOSIS — I4819 Other persistent atrial fibrillation: Secondary | ICD-10-CM | POA: Diagnosis not present

## 2021-12-18 DIAGNOSIS — I6523 Occlusion and stenosis of bilateral carotid arteries: Secondary | ICD-10-CM

## 2021-12-18 DIAGNOSIS — G4733 Obstructive sleep apnea (adult) (pediatric): Secondary | ICD-10-CM

## 2021-12-18 DIAGNOSIS — Z9989 Dependence on other enabling machines and devices: Secondary | ICD-10-CM | POA: Diagnosis not present

## 2021-12-18 DIAGNOSIS — I10 Essential (primary) hypertension: Secondary | ICD-10-CM | POA: Diagnosis not present

## 2021-12-18 DIAGNOSIS — I359 Nonrheumatic aortic valve disorder, unspecified: Secondary | ICD-10-CM

## 2021-12-18 LAB — LIPID PANEL
Chol/HDL Ratio: 5.4 ratio — ABNORMAL HIGH (ref 0.0–4.4)
Cholesterol, Total: 147 mg/dL (ref 100–199)
HDL: 27 mg/dL — ABNORMAL LOW (ref >39)
LDL Chol Calc (NIH): 86 mg/dL (ref 0–99)
Triglycerides: 198 mg/dL — ABNORMAL HIGH (ref 0–149)
VLDL Cholesterol Cal: 34 mg/dL (ref 5–40)

## 2021-12-18 LAB — ALT: ALT: 9 IU/L (ref 0–32)

## 2021-12-21 ENCOUNTER — Telehealth: Payer: Self-pay

## 2021-12-21 DIAGNOSIS — E7800 Pure hypercholesterolemia, unspecified: Secondary | ICD-10-CM

## 2021-12-21 MED ORDER — ICOSAPENT ETHYL 1 G PO CAPS: 2.0000 g | ORAL_CAPSULE | Freq: Two times a day (BID) | ORAL | 3 refills | Status: DC

## 2021-12-21 NOTE — Telephone Encounter (Signed)
-----   Message from Sueanne Margarita, MD sent at 12/20/2021  7:23 PM EDT ----- ?LDL is at goal but triglycerides elevated.  Please start Vascepa 2 g twice daily and repeat FLP in 3 months ?

## 2021-12-21 NOTE — Telephone Encounter (Signed)
The patient's daughhter has been notified of the result and verbalized understanding.  All questions (if any) were answered. ?Antonieta Iba, RN 12/21/2021 11:54 AM  ?Rx has been sent in. Repeat labs have been scheduled.  ?

## 2021-12-25 DIAGNOSIS — G4700 Insomnia, unspecified: Secondary | ICD-10-CM | POA: Diagnosis not present

## 2021-12-25 DIAGNOSIS — E039 Hypothyroidism, unspecified: Secondary | ICD-10-CM | POA: Diagnosis not present

## 2021-12-25 DIAGNOSIS — E1121 Type 2 diabetes mellitus with diabetic nephropathy: Secondary | ICD-10-CM | POA: Diagnosis not present

## 2021-12-25 DIAGNOSIS — N183 Chronic kidney disease, stage 3 unspecified: Secondary | ICD-10-CM | POA: Diagnosis not present

## 2021-12-25 DIAGNOSIS — I4891 Unspecified atrial fibrillation: Secondary | ICD-10-CM | POA: Diagnosis not present

## 2021-12-25 DIAGNOSIS — I10 Essential (primary) hypertension: Secondary | ICD-10-CM | POA: Diagnosis not present

## 2021-12-25 DIAGNOSIS — E7800 Pure hypercholesterolemia, unspecified: Secondary | ICD-10-CM | POA: Diagnosis not present

## 2021-12-31 ENCOUNTER — Telehealth: Payer: Self-pay

## 2021-12-31 NOTE — Telephone Encounter (Signed)
Vascepa P/A completed on CoverMyMeds ? ?KEY: [id number] ? ?Waiting for response ?

## 2022-01-04 NOTE — Telephone Encounter (Signed)
**Note De-Identified  Obfuscation** [name] (Key: [id number]) ?Rx #: [id number] ?Vascepa 1GM capsules ?  ?Form ?[place] [employer] Electronic Prior Authorization Form 2017 NCPDP ?Created ?Prior authorization for Tina Hester has been approved. ? ?Message from plan: 05-MAY-23:31-DEC-23 Vascepa 1GM OR CAPS Quantity:360 ? ?[id number] - [place], [place] - [address] [place] [address] (Ph: [phone]) has been notified of this approval. ?

## 2022-01-05 ENCOUNTER — Ambulatory Visit (HOSPITAL_COMMUNITY): Payer: HMO | Attending: Cardiology

## 2022-01-05 ENCOUNTER — Encounter: Payer: Self-pay | Admitting: Cardiology

## 2022-01-05 DIAGNOSIS — G4733 Obstructive sleep apnea (adult) (pediatric): Secondary | ICD-10-CM | POA: Insufficient documentation

## 2022-01-05 DIAGNOSIS — Z9989 Dependence on other enabling machines and devices: Secondary | ICD-10-CM | POA: Diagnosis not present

## 2022-01-05 DIAGNOSIS — I10 Essential (primary) hypertension: Secondary | ICD-10-CM | POA: Insufficient documentation

## 2022-01-05 DIAGNOSIS — I6523 Occlusion and stenosis of bilateral carotid arteries: Secondary | ICD-10-CM | POA: Diagnosis not present

## 2022-01-05 DIAGNOSIS — E7800 Pure hypercholesterolemia, unspecified: Secondary | ICD-10-CM | POA: Insufficient documentation

## 2022-01-05 DIAGNOSIS — I4819 Other persistent atrial fibrillation: Secondary | ICD-10-CM | POA: Diagnosis not present

## 2022-01-05 DIAGNOSIS — I359 Nonrheumatic aortic valve disorder, unspecified: Secondary | ICD-10-CM | POA: Insufficient documentation

## 2022-01-05 LAB — ECHOCARDIOGRAM COMPLETE
AR max vel: 1.6 cm2
AV Area VTI: 1.48 cm2
AV Area mean vel: 1.58 cm2
AV Mean grad: 17.3 mmHg
AV Peak grad: 30.3 mmHg
Ao pk vel: 2.75 m/s
S' Lateral: 2.6 cm

## 2022-01-11 ENCOUNTER — Ambulatory Visit: Payer: HMO | Admitting: Cardiology

## 2022-01-11 NOTE — Progress Notes (Deleted)
Electrophysiology Office Note   Date:  01/11/2022   ID:  VALEEN BORYS, DOB 1941/02/07, MRN [id number]  PCP:  Jonathon Jordan, MD  Cardiologist:  Radford Pax Primary Electrophysiologist:  Kimiko Common Meredith Leeds, MD    Chief Complaint: AF   History of Present Illness: Tina Hester is a 81 y.o. female who is being seen today for the evaluation of AF at the request of Jonathon Jordan, MD. Presenting today for electrophysiology evaluation.  She has a history significant for atrial fibrillation, hypertension, carotid artery stenosis, aortic stenosis, morbid obesity, obstructive sleep apnea on CPAP.  Today, she denies*** symptoms of palpitations, chest pain, shortness of breath, orthopnea, PND, lower extremity edema, claudication, dizziness, presyncope, syncope, bleeding, or neurologic sequela. The patient is tolerating medications without difficulties.    Past Medical History:  Diagnosis Date   Anemia    s/p Heme work -up normal EGD and colonscopy in 2012 per pt,neg SPEP   Anxiety    Aortic stenosis    Mild by echo 12/2021 with mean aortic valve gradient 17 mmHg   Bilateral carotid artery stenosis 06/08/2015   1-39% bilateral   Bradycardia    CKD (chronic kidney disease), stage III (HCC)    Diabetes mellitus without complication (HCC)    type 2   GERD (gastroesophageal reflux disease)    Gout    HSV-1 (herpes simplex virus 1) infection    Acyclovir prn   Hyperkalemia    Hyperlipidemia    Hypertension    Joint pain    osteoarthritis by Xray- possible erosion of R 4th MCP,elevated uric  acid ,ANA +    MCI (mild cognitive impairment)    Morbid obesity (HCC)    OSA on CPAP    Osteopenia    PAF (paroxysmal atrial fibrillation) ([place])    Pain management    Neurosurg: Dr Clydell Hakim   Pulmonary HTN ([place])    Moderate by echo  2016. PASP 25mHg   TIA (transient ischemic attack)    remote history of TIA's early 2000   Past Surgical History:  Procedure Laterality Date    ABDOMINAL HYSTERECTOMY     APPENDECTOMY     BACK SURGERY     cervical fusion   BLADDER SURGERY     CESAREAN SECTION     CHOLECYSTECTOMY       Current Outpatient Medications  Medication Sig Dispense Refill   acetaminophen (TYLENOL) 325 MG tablet Take by mouth every 6 (six) hours as needed for mild pain or moderate pain.     acyclovir (ZOVIRAX) 400 MG tablet Take 400 mg by mouth 2 (two) times daily.      ALPRAZolam (XANAX) 0.25 MG tablet Take 0.25 mg by mouth at bedtime as needed for anxiety.     amLODipine (NORVASC) 5 MG tablet Take 1 tablet (5 mg total) by mouth every morning. Please keep upcoming appointment for future refills. Thank you (Patient not taking: Reported on 10/27/2021) 90 tablet 0   apixaban (ELIQUIS) 5 MG TABS tablet TAKE 1 TABLET(5 MG) BY MOUTH TWICE DAILY 180 tablet 0   atorvastatin (LIPITOR) 40 MG tablet TAKE ONE TABLET BY MOUTH EVERY EVENING. Please keep upcoming appointment in March 2023 for future refills. Thank you 90 tablet 0   atropine 1 % ophthalmic ointment 1 application 2 (two) times daily.     Blood Glucose Monitoring Suppl (FREESTYLE FREEDOM LITE) w/Device KIT 1 each by Does not apply route in the morning and at bedtime. Use Freestyle lite meter to  check blood sugar twice daily. 1 kit 1   CALCIUM PO Take 1 tablet by mouth daily. 600 mg     Cholecalciferol 2000 units TABS Take 4,000 Units by mouth daily.     COLCRYS 0.6 MG tablet Take 0.6 mg by mouth daily as needed (gout).     erythromycin ophthalmic ointment 1 application 4 (four) times daily.     gabapentin (NEURONTIN) 100 MG capsule Take 100-200 mg by mouth at bedtime.      glucose blood (FREESTYLE LITE) test strip Use to test blood sugar twice daily. Dx: E11.9 (Patient not taking: Reported on 10/27/2021) 200 each 1   hydrochlorothiazide (HYDRODIURIL) 25 MG tablet Take 1 tablet (25 mg total) by mouth every morning. Please keep upcoming appointment in March 2023 for future refills. Thank you 90 tablet 0    hydrocortisone cream 1 % Apply 1 application topically daily as needed for itching.     icosapent Ethyl (VASCEPA) 1 g capsule Take 2 capsules (2 g total) by mouth 2 (two) times daily. 360 capsule 3   insulin glargine, 1 Unit Dial, (TOUJEO SOLOSTAR) 300 UNIT/ML Solostar Pen Inject 30 Units into the skin daily. 4.5 mL 3   Insulin NPH, Human,, Isophane, (NOVOLIN N FLEXPEN) 100 UNIT/ML Kiwkpen Inject 15 units at morning and 18 units at night (Patient not taking: Reported on 11/20/2021) 15 mL 2   insulin regular (NOVOLIN R) 100 units/mL injection Inject into the skin 3 (three) times daily before meals. 8 -10 units     levothyroxine (SYNTHROID) 150 MCG tablet TAKE ONE TABLET BY MOUTH BEFORE BREAKFAST 90 tablet 0   magnesium 30 MG tablet Take 30 mg by mouth at bedtime.     metFORMIN (GLUCOPHAGE) 500 MG tablet TAKE ONE TABLET BY MOUTH EVERY MORNING and TAKE TWO TABLETS BY MOUTH EVERY EVENING 270 tablet 0   metoprolol tartrate (LOPRESSOR) 50 MG tablet Take 1 tablet (50 mg total) by mouth 2 (two) times daily. 180 tablet 3   NEEDLE, DISP, 30 G (BD DISP NEEDLES) 30G X 1/2" MISC 1 each by Does not apply route 2 (two) times daily before a meal. 100 each 5   ONETOUCH DELICA LANCETS 62G MISC Use to check blood sugars once daily 50 each 3   OZEMPIC, 0.25 OR 0.5 MG/DOSE, 2 MG/1.5ML SOPN INJECT 0.5 MG SUBCUTANEOUSLY ONCE A WEEK 4.5 mL 0   pioglitazone (ACTOS) 15 MG tablet TAKE ONE TABLET BY MOUTH EVERY MORNING 90 tablet 0   prednisoLONE acetate (PRED FORTE) 1 % ophthalmic suspension Place 2 drops into the right eye daily.  (Patient not taking: Reported on 09/10/2021)  2   Semaglutide, 1 MG/DOSE, (OZEMPIC, 1 MG/DOSE,) 2 MG/1.5ML SOPN Inject 1 mg into the skin once a week. 1.5 mL 2   timolol (TIMOPTIC) 0.5 % ophthalmic solution Place 1 drop into both eyes daily.  1   valACYclovir (VALTREX) 1000 MG tablet Take by mouth.     vitamin B-12 (CYANOCOBALAMIN) 1000 MCG tablet Take 1,000 mcg by mouth daily. (Patient not taking:  Reported on 11/20/2021)     vitamin C (ASCORBIC ACID) 500 MG tablet Take 500 mg by mouth daily. (Patient not taking: Reported on 11/20/2021)     No current facility-administered medications for this visit.    Allergies:   Pneumococcal vaccine, Pneumovax 23 [pneumococcal vac polyvalent], Other, Sulfa antibiotics, and Pregabalin   Social History:  The patient  reports that she has never smoked. She has never used smokeless tobacco. She reports that she  does not drink alcohol and does not use drugs.   Family History:  The patient's family history includes Arrhythmia in her brother.    ROS:  Please see the history of present illness.   Otherwise, review of systems is positive for none.   All other systems are reviewed and negative.    PHYSICAL EXAM: VS:  There were no vitals taken for this visit. , BMI There is no height or weight on file to calculate BMI. GEN: Well nourished, well developed, in no acute distress  HEENT: normal  Neck: no JVD, carotid bruits, or masses Cardiac: ***RRR; no murmurs, rubs, or gallops,no edema  Respiratory:  clear to auscultation bilaterally, normal work of breathing GI: soft, nontender, nondistended, + BS MS: no deformity or atrophy  Skin: warm and dry Neuro:  Strength and sensation are intact Psych: euthymic mood, full affect  EKG:  EKG {ACTION; IS/IS [id number]} ordered today. Personal review of the ekg ordered *** shows ***  Recent Labs: 12/18/2021: ALT 9    Lipid Panel     Component Value Date/Time   CHOL 147 12/18/2021 0949   TRIG 198 (H) 12/18/2021 0949   HDL 27 (L) 12/18/2021 0949   CHOLHDL 5.4 (H) 12/18/2021 0949   CHOLHDL 5 08/06/2020 1524   VLDL 49.8 (H) 08/06/2020 1524   LDLCALC 86 12/18/2021 0949   LDLDIRECT 81.0 08/06/2020 1524     Wt Readings from Last 3 Encounters:  11/20/21 216 lb (98 kg)  10/27/21 214 lb (97.1 kg)  09/10/21 219 lb 9.6 oz (99.6 kg)      Other studies Reviewed: Additional studies/ records that were  reviewed today include: TTE 01/05/22  Review of the above records today demonstrates:   1. Left ventricular ejection fraction, by estimation, is 55 to 60%. The  left ventricle has normal function. The left ventricle has no regional  wall motion abnormalities. There is mild left ventricular hypertrophy.  Left ventricular diastolic function  could not be evaluated.   2. Right ventricular systolic function is normal. The right ventricular  size is normal.   3. The mitral valve is normal in structure. Mild mitral valve  regurgitation. No evidence of mitral stenosis.   4. The aortic valve is tricuspid. Aortic valve regurgitation is mild.  Mild aortic valve stenosis.   5. The inferior vena cava is normal in size with greater than 50%  respiratory variability, suggesting right atrial pressure of 3 mmHg.    ASSESSMENT AND PLAN:  1.  Persistent atrial fibrillation/flutter: Has failed to Multaq.  CHA2DS2-VASc of at least 4.  Currently on metoprolol 50 mg twice daily, Eliquis 5 mg twice daily.***  2.  Hypertension:***  3.  Carotid artery stenosis: Continue statin per primary cardiology  4.  Obstructive sleep apnea: CPAP compliance encouraged  5.  Secondary hypercoagulable state: Currently on Eliquis for atrial fibrillation as above.  Current medicines are reviewed at length with the patient today.   The patient {ACTIONS; HAS/DOES NOT HAVE:19233} concerns regarding her medicines.  The following changes were made today:  {NONE DEFAULTED:18576}  Labs/ tests ordered today include: *** No orders of the defined types were placed in this encounter.    Disposition:   FU with Casimer Russett {gen number [id number]} {Days to years:10300}  Signed, Claiborne Stroble Meredith Leeds, MD  01/11/2022 9:38 AM     [facility] [address] [place] [address] [phone] (office) [phone] (fax)

## 2022-01-19 ENCOUNTER — Inpatient Hospital Stay: Admission: RE | Admit: 2022-01-19 | Payer: HMO | Source: Ambulatory Visit

## 2022-01-26 DIAGNOSIS — I10 Essential (primary) hypertension: Secondary | ICD-10-CM | POA: Diagnosis not present

## 2022-01-26 DIAGNOSIS — I4891 Unspecified atrial fibrillation: Secondary | ICD-10-CM | POA: Diagnosis not present

## 2022-01-26 DIAGNOSIS — N183 Chronic kidney disease, stage 3 unspecified: Secondary | ICD-10-CM | POA: Diagnosis not present

## 2022-01-26 DIAGNOSIS — E039 Hypothyroidism, unspecified: Secondary | ICD-10-CM | POA: Diagnosis not present

## 2022-01-26 DIAGNOSIS — E7800 Pure hypercholesterolemia, unspecified: Secondary | ICD-10-CM | POA: Diagnosis not present

## 2022-01-26 DIAGNOSIS — E1121 Type 2 diabetes mellitus with diabetic nephropathy: Secondary | ICD-10-CM | POA: Diagnosis not present

## 2022-01-28 ENCOUNTER — Other Ambulatory Visit: Payer: Self-pay | Admitting: Endocrinology

## 2022-01-28 ENCOUNTER — Other Ambulatory Visit: Payer: Self-pay | Admitting: Cardiology

## 2022-01-28 DIAGNOSIS — G4733 Obstructive sleep apnea (adult) (pediatric): Secondary | ICD-10-CM

## 2022-01-28 DIAGNOSIS — I350 Nonrheumatic aortic (valve) stenosis: Secondary | ICD-10-CM

## 2022-01-28 DIAGNOSIS — I272 Pulmonary hypertension, unspecified: Secondary | ICD-10-CM

## 2022-01-28 DIAGNOSIS — I6523 Occlusion and stenosis of bilateral carotid arteries: Secondary | ICD-10-CM

## 2022-01-28 DIAGNOSIS — I10 Essential (primary) hypertension: Secondary | ICD-10-CM

## 2022-01-28 DIAGNOSIS — I480 Paroxysmal atrial fibrillation: Secondary | ICD-10-CM

## 2022-01-28 NOTE — Telephone Encounter (Signed)
Pt last saw Dr Radford Pax 11/20/21, last labs 08/19/21 Creat 1.20, age 81, weight 98kg, based on specified criteria pt is on appropriate dosage of Eliquis '5mg'$  BID for afib.  Will refill rx.

## 2022-02-16 DIAGNOSIS — Z0000 Encounter for general adult medical examination without abnormal findings: Secondary | ICD-10-CM | POA: Diagnosis not present

## 2022-02-16 DIAGNOSIS — E559 Vitamin D deficiency, unspecified: Secondary | ICD-10-CM | POA: Diagnosis not present

## 2022-02-16 DIAGNOSIS — E1165 Type 2 diabetes mellitus with hyperglycemia: Secondary | ICD-10-CM | POA: Diagnosis not present

## 2022-02-16 DIAGNOSIS — F330 Major depressive disorder, recurrent, mild: Secondary | ICD-10-CM | POA: Diagnosis not present

## 2022-02-16 DIAGNOSIS — D692 Other nonthrombocytopenic purpura: Secondary | ICD-10-CM | POA: Diagnosis not present

## 2022-02-16 DIAGNOSIS — E1121 Type 2 diabetes mellitus with diabetic nephropathy: Secondary | ICD-10-CM | POA: Diagnosis not present

## 2022-02-16 DIAGNOSIS — I5042 Chronic combined systolic (congestive) and diastolic (congestive) heart failure: Secondary | ICD-10-CM | POA: Diagnosis not present

## 2022-02-16 DIAGNOSIS — I350 Nonrheumatic aortic (valve) stenosis: Secondary | ICD-10-CM | POA: Diagnosis not present

## 2022-02-16 DIAGNOSIS — I4891 Unspecified atrial fibrillation: Secondary | ICD-10-CM | POA: Diagnosis not present

## 2022-02-16 DIAGNOSIS — E039 Hypothyroidism, unspecified: Secondary | ICD-10-CM | POA: Diagnosis not present

## 2022-02-16 DIAGNOSIS — Z79899 Other long term (current) drug therapy: Secondary | ICD-10-CM | POA: Diagnosis not present

## 2022-02-16 DIAGNOSIS — I6523 Occlusion and stenosis of bilateral carotid arteries: Secondary | ICD-10-CM | POA: Diagnosis not present

## 2022-02-16 DIAGNOSIS — I10 Essential (primary) hypertension: Secondary | ICD-10-CM | POA: Diagnosis not present

## 2022-02-17 ENCOUNTER — Other Ambulatory Visit: Payer: Self-pay | Admitting: Endocrinology

## 2022-03-19 ENCOUNTER — Other Ambulatory Visit: Payer: Self-pay | Admitting: Endocrinology

## 2022-03-19 DIAGNOSIS — I4891 Unspecified atrial fibrillation: Secondary | ICD-10-CM | POA: Diagnosis not present

## 2022-03-19 DIAGNOSIS — E039 Hypothyroidism, unspecified: Secondary | ICD-10-CM | POA: Diagnosis not present

## 2022-03-19 DIAGNOSIS — N183 Chronic kidney disease, stage 3 unspecified: Secondary | ICD-10-CM | POA: Diagnosis not present

## 2022-03-19 DIAGNOSIS — E7800 Pure hypercholesterolemia, unspecified: Secondary | ICD-10-CM | POA: Diagnosis not present

## 2022-03-19 DIAGNOSIS — E1121 Type 2 diabetes mellitus with diabetic nephropathy: Secondary | ICD-10-CM | POA: Diagnosis not present

## 2022-03-19 DIAGNOSIS — I10 Essential (primary) hypertension: Secondary | ICD-10-CM | POA: Diagnosis not present

## 2022-03-22 ENCOUNTER — Other Ambulatory Visit: Payer: HMO

## 2022-03-31 ENCOUNTER — Other Ambulatory Visit: Payer: Self-pay | Admitting: Endocrinology

## 2022-04-15 DIAGNOSIS — Z7984 Long term (current) use of oral hypoglycemic drugs: Secondary | ICD-10-CM | POA: Diagnosis not present

## 2022-04-15 DIAGNOSIS — E119 Type 2 diabetes mellitus without complications: Secondary | ICD-10-CM | POA: Diagnosis not present

## 2022-04-15 DIAGNOSIS — Z794 Long term (current) use of insulin: Secondary | ICD-10-CM | POA: Diagnosis not present

## 2022-04-15 DIAGNOSIS — F02A0 Dementia in other diseases classified elsewhere, mild, without behavioral disturbance, psychotic disturbance, mood disturbance, and anxiety: Secondary | ICD-10-CM | POA: Diagnosis not present

## 2022-04-15 DIAGNOSIS — G309 Alzheimer's disease, unspecified: Secondary | ICD-10-CM | POA: Diagnosis not present

## 2022-04-20 ENCOUNTER — Other Ambulatory Visit: Payer: Self-pay | Admitting: Endocrinology

## 2022-04-23 DIAGNOSIS — E1121 Type 2 diabetes mellitus with diabetic nephropathy: Secondary | ICD-10-CM | POA: Diagnosis not present

## 2022-04-23 DIAGNOSIS — E7800 Pure hypercholesterolemia, unspecified: Secondary | ICD-10-CM | POA: Diagnosis not present

## 2022-04-23 DIAGNOSIS — I10 Essential (primary) hypertension: Secondary | ICD-10-CM | POA: Diagnosis not present

## 2022-04-23 DIAGNOSIS — N183 Chronic kidney disease, stage 3 unspecified: Secondary | ICD-10-CM | POA: Diagnosis not present

## 2022-05-07 ENCOUNTER — Encounter: Payer: Self-pay | Admitting: Cardiology

## 2022-05-07 ENCOUNTER — Ambulatory Visit: Payer: HMO | Attending: Cardiology | Admitting: Cardiology

## 2022-05-07 VITALS — BP 126/58 | HR 78 | Ht 61.5 in | Wt 209.4 lb

## 2022-05-07 DIAGNOSIS — Z9989 Dependence on other enabling machines and devices: Secondary | ICD-10-CM

## 2022-05-07 DIAGNOSIS — I10 Essential (primary) hypertension: Secondary | ICD-10-CM | POA: Diagnosis not present

## 2022-05-07 DIAGNOSIS — I6523 Occlusion and stenosis of bilateral carotid arteries: Secondary | ICD-10-CM | POA: Diagnosis not present

## 2022-05-07 DIAGNOSIS — I359 Nonrheumatic aortic valve disorder, unspecified: Secondary | ICD-10-CM

## 2022-05-07 DIAGNOSIS — G4733 Obstructive sleep apnea (adult) (pediatric): Secondary | ICD-10-CM

## 2022-05-07 DIAGNOSIS — I4819 Other persistent atrial fibrillation: Secondary | ICD-10-CM | POA: Diagnosis not present

## 2022-05-07 DIAGNOSIS — E7800 Pure hypercholesterolemia, unspecified: Secondary | ICD-10-CM

## 2022-05-07 MED ORDER — METOPROLOL TARTRATE 50 MG PO TABS: 50.0000 mg | ORAL_TABLET | Freq: Two times a day (BID) | ORAL | 3 refills | Status: DC

## 2022-05-07 MED ORDER — APIXABAN 5 MG PO TABS: 5.0000 mg | ORAL_TABLET | Freq: Two times a day (BID) | ORAL | 3 refills | Status: DC

## 2022-05-07 NOTE — Patient Instructions (Signed)
Medication Instructions:  Your physician recommends that you continue on your current medications as directed. Please refer to the Current Medication list given to you today.  *If you need a refill on your cardiac medications before your next appointment, please call your pharmacy*  Testing/Procedures: Your physician has recommended that you have a sleep study. This test records several body functions during sleep, including: brain activity, eye movement, oxygen and carbon dioxide blood levels, heart rate and rhythm, breathing rate and rhythm, the flow of air through your mouth and nose, snoring, body muscle movements, and chest and belly movement.  Your physician has requested that you have an echocardiogram in May 2024. Echocardiography is a painless test that uses sound waves to create images of your heart. It provides your doctor with information about the size and shape of your heart and how well your heart's chambers and valves are working. This procedure takes approximately one hour. There are no restrictions for this procedure.  Follow-Up: At [facility], you and your health needs are our priority.  As part of our continuing mission to provide you with exceptional heart care, we have created designated Provider Care Teams.  These Care Teams include your primary Cardiologist (physician) and Advanced Practice Providers (APPs -  Physician Assistants and Nurse Practitioners) who all work together to provide you with the care you need, when you need it.  Your next appointment:   1 year(s)  The format for your next appointment:   In Person  Provider:   Fransico Him, MD      Important Information About Sugar

## 2022-05-07 NOTE — Progress Notes (Signed)
Date:  05/07/2022   ID:  Tina Hester, DOB [birth year], MRN [id number] The patient was identified using 2 identifiers. Date:  05/07/2022   ID:  Tina Hester, DOB September 30, 1940, MRN [id number]   PCP:  Jonathon Jordan, MD  Cardiologist:  Fransico Him, MD  Electrophysiologist:  Constance Haw, MD   Chief Complaint:  Atrial fibrillation, OSA, HTN  History of Present Illness:    Tina Hester is a 81y.o. female with a hx of PAF, HTN, bilateral carotid artery stenosis, aortic stenosis, morbid obesity and OSA on CPAP.     She is here today for followup and is doing well.  She does not complain of shortness of breath but mainly only when walking up hills and her daughter says this is not new for her and is a chronic problem that is unchanged.  She denies any chest pain or pressure, PND, orthopnea, LE edema, dizziness, palpitations or syncope. She is compliant with her meds and is tolerating meds with no SE.    She will 20 pounds and decided to stop her CPAP on her own because she felt she likely did not sleep apnea more because she was sleeping better at night.  She says she is not really tired during the day.  Prior CV studies:   The following studies were reviewed today: PAP compliance download, EKG  Past Medical History:  Diagnosis Date   Anemia    s/p Heme work -up normal EGD and colonscopy in 2012 per pt,neg SPEP   Anxiety    Aortic stenosis    Mild by echo 12/2021 with mean aortic valve gradient 17 mmHg   Bilateral carotid artery stenosis 06/08/2015   1-39% bilateral   Bradycardia    CKD (chronic kidney disease), stage III (HCC)    Diabetes mellitus without complication (HCC)    type 2   GERD (gastroesophageal reflux disease)    Gout    HSV-1 (herpes simplex virus 1) infection    Acyclovir prn   Hyperkalemia    Hyperlipidemia    Hypertension    Joint pain    osteoarthritis by Xray- possible erosion of R 4th MCP,elevated uric  acid ,ANA +    MCI (mild  cognitive impairment)    Morbid obesity (HCC)    OSA on CPAP    Osteopenia    PAF (paroxysmal atrial fibrillation) ([place])    Pain management    Neurosurg: Dr Clydell Hakim   Pulmonary HTN ([place])    Moderate by echo  2016. PASP 45mHg   TIA (transient ischemic attack)    remote history of TIA's early 2000   Past Surgical History:  Procedure Laterality Date   ABDOMINAL HYSTERECTOMY     APPENDECTOMY     BACK SURGERY     cervical fusion   BLADDER SURGERY     CESAREAN SECTION     CHOLECYSTECTOMY       No outpatient medications have been marked as taking for the 05/07/22 encounter (Office Visit) with TSueanne Margarita MD.     Allergies:   Pneumococcal vaccine, Pneumovax 23 [pneumococcal vac polyvalent], Other, Sulfa antibiotics, and Pregabalin   Social History   Tobacco Use   Smoking status: Never   Smokeless tobacco: Never  Vaping Use   Vaping Use: Never used  Substance Use Topics   Alcohol use: No   Drug use: No     Family Hx: The patient's family history includes Arrhythmia in her brother.  ROS:  Please see the history of present illness.     All other systems reviewed and are negative.   Labs/Other Tests and Data Reviewed:    Recent Labs: 12/18/2021: ALT 9   Recent Lipid Panel Lab Results  Component Value Date/Time   CHOL 147 12/18/2021 09:49 AM   TRIG 198 (H) 12/18/2021 09:49 AM   HDL 27 (L) 12/18/2021 09:49 AM   CHOLHDL 5.4 (H) 12/18/2021 09:49 AM   CHOLHDL 5 08/06/2020 03:24 PM   LDLCALC 86 12/18/2021 09:49 AM   LDLDIRECT 81.0 08/06/2020 03:24 PM    Wt Readings from Last 3 Encounters:  05/07/22 209 lb 6.4 oz (95 kg)  11/20/21 216 lb (98 kg)  10/27/21 214 lb (97.1 kg)     Objective:    Vital Signs:  BP (!) 126/58   Pulse 78   Ht 5' 1.5" (1.562 m)   Wt 209 lb 6.4 oz (95 kg)   SpO2 98%   BMI 38.93 kg/m   GEN: Well nourished, well developed in no acute distress HEENT: Normal NECK: No JVD; No carotid bruits LYMPHATICS: No  lymphadenopathy CARDIAC:RRR, no murmurs, rubs, gallops RESPIRATORY:  Clear to auscultation without rales, wheezing or rhonchi  ABDOMEN: Soft, non-tender, non-distended MUSCULOSKELETAL:  No edema; No deformity  SKIN: Warm and dry NEUROLOGIC:  Alert and oriented x 3 PSYCHIATRIC:  Normal affect  ASSESSMENT & PLAN:    1.  Persistent atrial fibrillation/atrial flutter -she failed Multaq.   -She was evaluated by Dr. Lennie Odor and was doing better and wanted to hold off on afib ablation -She was supposed to follow-up with Dr. Curt Bears but this did not occur -Continue present drug management with Eliquis 5 mg twice daily  and Lopressor 50 mg twice daily with as needed refills -I have personally reviewed and interpreted outside labs performed by patient's PCP which showed serum creatinine 1.16, potassium 3.8 and hemoglobin 12.1 on 02/16/2022 -she appears to be in normal sinus rhythm today on exam  2.  Hypertension  -BP is well controlled on exam today -Continue prescription drug management with HCTZ 25 mg daily, amlodipine 5 mg daily, Lopressor 50 mg twice daily with as needed refills  3.  Bilateral carotid artery stenosis  -her last carotid Dopplers in 2018 showed mild carotid stenosis 1 to 39% but repeat dopplers 01/2019 essentially normal -continue statin  4.  Mild aortic stenosis/Mild AR  -2D echo 2020 showed showed a mean aortic valve gradient 6mHg -2D echo 01/05/2022 showed EF 55 to 60% with mild MR and mild AR with mild aortic stenosis and mean aortic valve gradient 17 mmHg -Repeat 2D echo 12/2022  5.  Hyperlipidemia  -LDL goal is less than 100 -I have personally reviewed and interpreted outside labs performed by patient's PCP which showed LDL 63 and HDL 28 on 02/16/2022 -Continue prescription drug management with atorvastatin 40 mg daily with as needed refills  6.  OSA -she stopped using her device recently because she lost 20 pounds and felt that she did not need her CPAP anymore. -I  tried to explain to her that she is still overweight with a BMI of 39 and likely still has residual obstructive sleep apnea -I will send her to the lab for PSG to see if she still has residual sleep apnea  Medication Adjustments/Labs and Tests Ordered: Current medicines are reviewed at length with the patient today.  Concerns regarding medicines are outlined above.  Tests Ordered: Orders Placed This Encounter  Procedures   ECHOCARDIOGRAM COMPLETE  PSG SLEEP STUDY   Medication Changes: Meds ordered this encounter  Medications   apixaban (ELIQUIS) 5 MG TABS tablet    Sig: Take 1 tablet (5 mg total) by mouth 2 (two) times daily.    Dispense:  180 tablet    Refill:  3   metoprolol tartrate (LOPRESSOR) 50 MG tablet    Sig: Take 1 tablet (50 mg total) by mouth 2 (two) times daily.    Dispense:  180 tablet    Refill:  3    Disposition:  Follow up in 6 month(s)  Signed, Fransico Him, MD  05/07/2022 3:16 PM    [place]

## 2022-05-19 ENCOUNTER — Other Ambulatory Visit: Payer: Self-pay | Admitting: Endocrinology

## 2022-06-17 ENCOUNTER — Other Ambulatory Visit: Payer: Self-pay | Admitting: Endocrinology

## 2022-07-01 ENCOUNTER — Telehealth: Payer: Self-pay | Admitting: Endocrinology

## 2022-07-01 ENCOUNTER — Other Ambulatory Visit: Payer: Self-pay | Admitting: Endocrinology

## 2022-07-01 NOTE — Telephone Encounter (Signed)
MEDICATION: TOUJEO SOLOSTAR 300 UNIT/ML Solostar Pen   PHARMACY:    [facility] - [place], [place] - [address] (Ph: [phone]    HAS THE PATIENT CONTACTED [place]?  yes  IS THIS A 90 DAY SUPPLY : yes  IS PATIENT OUT OF MEDICATION: yes  IF NOT; HOW MUCH IS LEFT:   LAST APPOINTMENT DATE: '@1'$ /07/2022  NEXT APPOINTMENT DATE:'@2'$ /16/2024  DO WE HAVE YOUR PERMISSION TO LEAVE A DETAILED MESSAGE?:Yes  OTHER COMMENTS:    **Let patient know to contact pharmacy at the end of the day to make sure medication is ready. **  ** Please notify patient to allow 48-72 hours to process**  **Encourage patient to contact the pharmacy for refills or they can request refills through [facility]**

## 2022-07-05 NOTE — Telephone Encounter (Signed)
RX sent on 11/2.

## 2022-07-15 ENCOUNTER — Other Ambulatory Visit: Payer: Self-pay | Admitting: Cardiology

## 2022-07-15 DIAGNOSIS — I4819 Other persistent atrial fibrillation: Secondary | ICD-10-CM

## 2022-07-15 NOTE — Telephone Encounter (Signed)
Prescription refill request for Eliquis received. Indication: Afib  Last office visit: 05/07/22 Radford Pax)  Scr: 1.20 (08/19/21)  Age: 81 Weight: 95kg  Appropriate dose and refill sent to requested pharmacy.

## 2022-07-21 ENCOUNTER — Other Ambulatory Visit: Payer: Self-pay | Admitting: Endocrinology

## 2022-07-28 ENCOUNTER — Other Ambulatory Visit: Payer: Self-pay

## 2022-07-28 DIAGNOSIS — E063 Autoimmune thyroiditis: Secondary | ICD-10-CM

## 2022-07-28 DIAGNOSIS — E1165 Type 2 diabetes mellitus with hyperglycemia: Secondary | ICD-10-CM

## 2022-07-28 MED ORDER — PIOGLITAZONE HCL 15 MG PO TABS: 15.0000 mg | ORAL_TABLET | Freq: Every morning | ORAL | 0 refills | Status: DC

## 2022-07-28 MED ORDER — "BD DISP NEEDLES 30G X 1/2"" MISC": 1.0000 | Freq: Two times a day (BID) | 1 refills | Status: DC

## 2022-07-28 MED ORDER — LEVOTHYROXINE SODIUM 150 MCG PO TABS: ORAL_TABLET | ORAL | 0 refills | Status: DC

## 2022-07-28 MED ORDER — METFORMIN HCL 500 MG PO TABS: ORAL_TABLET | ORAL | 0 refills | Status: DC

## 2022-08-16 ENCOUNTER — Other Ambulatory Visit: Payer: Self-pay | Admitting: Endocrinology

## 2022-08-17 LAB — HEMOGLOBIN A1C: Hemoglobin A1C: 8.7

## 2022-10-07 ENCOUNTER — Encounter: Payer: Self-pay | Admitting: Endocrinology

## 2022-10-07 ENCOUNTER — Other Ambulatory Visit: Payer: Self-pay | Admitting: Endocrinology

## 2022-10-07 DIAGNOSIS — Z794 Long term (current) use of insulin: Secondary | ICD-10-CM

## 2022-10-11 ENCOUNTER — Other Ambulatory Visit: Payer: HMO

## 2022-10-15 ENCOUNTER — Ambulatory Visit: Payer: HMO | Admitting: Endocrinology

## 2022-10-27 ENCOUNTER — Telehealth: Payer: Self-pay | Admitting: Cardiology

## 2022-10-27 NOTE — Telephone Encounter (Signed)
Pt c/o medication issue:  1. Name of Medication: icosapent Ethyl (VASCEPA) 1 g capsule   2. How are you currently taking this medication (dosage and times per day)?   Take 2 capsules (2 g total) by mouth 2 (two) times daily.   3. Are you having a reaction (difficulty breathing--STAT)? No  4. What is your medication issue? Pharmacy needing Prior Auth for medication. Please advise

## 2022-10-28 NOTE — Telephone Encounter (Signed)
**Note De-Identified  Obfuscation** Per Dewaine Oats at [facility]: Pts ID: [id number] [place]: [id number] PCN: [place]: COS Ins phone number: [phone]  I have started a Icosapent PA through covermymeds. Key: [id number]

## 2022-10-28 NOTE — Telephone Encounter (Signed)
**Note De-Identified  Obfuscation** I have attempted to do this Icosapent PA several times through covermymeds but receive the same message each time: There was an error with your request Eligibility could not be verified for this patient - patient not found. Please review patient information.  I then called [facility] [place] (Pharmacy) at [phone] but got a message to call back during their normal business hours which will be at 11 am for [place] and 9 am for the pharmacy.  Will call back later today.

## 2022-10-29 NOTE — Telephone Encounter (Signed)
**Note De-Identified  Obfuscation** Tina Hester (Key: [id number]) PA Case ID #: [id number] Outcome N/A on February 29 This medication or product is on your plan's list of covered drugs. Prior authorization is not required at this time.  Drug Icosapent Ethyl 1GM capsules ePA cloud logo Form [employer] Medicare Part D Electronic Prior Authorization Form [phone] NCPDP)  I called [facility] [place] [place] (Pharmacy) at [phone]) but received a message that I am calling outside of their normal business hours. I will call back later today to advise of the above outcome on the pts Icsapent PA.

## 2022-12-30 ENCOUNTER — Other Ambulatory Visit: Payer: Self-pay

## 2022-12-30 ENCOUNTER — Emergency Department (HOSPITAL_COMMUNITY): Payer: PPO

## 2022-12-30 ENCOUNTER — Inpatient Hospital Stay (HOSPITAL_COMMUNITY)
Admission: EM | Admit: 2022-12-30 | Discharge: 2023-01-03 | DRG: 638 | Disposition: A | Discharge status: Skilled Nursing Facility | Payer: PPO | Source: Skilled Nursing Facility | Attending: Internal Medicine | Admitting: Internal Medicine

## 2022-12-30 ENCOUNTER — Encounter (HOSPITAL_COMMUNITY): Payer: Self-pay

## 2022-12-30 DIAGNOSIS — I350 Nonrheumatic aortic (valve) stenosis: Secondary | ICD-10-CM | POA: Diagnosis present

## 2022-12-30 DIAGNOSIS — S91301A Unspecified open wound, right foot, initial encounter: Secondary | ICD-10-CM | POA: Diagnosis present

## 2022-12-30 DIAGNOSIS — E1165 Type 2 diabetes mellitus with hyperglycemia: Secondary | ICD-10-CM | POA: Insufficient documentation

## 2022-12-30 DIAGNOSIS — Z7985 Long-term (current) use of injectable non-insulin antidiabetic drugs: Secondary | ICD-10-CM

## 2022-12-30 DIAGNOSIS — L97419 Non-pressure chronic ulcer of right heel and midfoot with unspecified severity: Secondary | ICD-10-CM | POA: Diagnosis not present

## 2022-12-30 DIAGNOSIS — M8580 Other specified disorders of bone density and structure, unspecified site: Secondary | ICD-10-CM | POA: Diagnosis not present

## 2022-12-30 DIAGNOSIS — R001 Bradycardia, unspecified: Secondary | ICD-10-CM | POA: Diagnosis not present

## 2022-12-30 DIAGNOSIS — I959 Hypotension, unspecified: Secondary | ICD-10-CM | POA: Diagnosis not present

## 2022-12-30 DIAGNOSIS — E876 Hypokalemia: Secondary | ICD-10-CM | POA: Diagnosis not present

## 2022-12-30 DIAGNOSIS — H40002 Preglaucoma, unspecified, left eye: Secondary | ICD-10-CM | POA: Diagnosis not present

## 2022-12-30 DIAGNOSIS — L03116 Cellulitis of left lower limb: Secondary | ICD-10-CM

## 2022-12-30 DIAGNOSIS — I4819 Other persistent atrial fibrillation: Secondary | ICD-10-CM | POA: Diagnosis present

## 2022-12-30 DIAGNOSIS — F0393 Unspecified dementia, unspecified severity, with mood disturbance: Secondary | ICD-10-CM | POA: Diagnosis present

## 2022-12-30 DIAGNOSIS — Z887 Allergy status to serum and vaccine status: Secondary | ICD-10-CM

## 2022-12-30 DIAGNOSIS — Z9049 Acquired absence of other specified parts of digestive tract: Secondary | ICD-10-CM

## 2022-12-30 DIAGNOSIS — K219 Gastro-esophageal reflux disease without esophagitis: Secondary | ICD-10-CM | POA: Insufficient documentation

## 2022-12-30 DIAGNOSIS — Z6839 Body mass index (BMI) 39.0-39.9, adult: Secondary | ICD-10-CM

## 2022-12-30 DIAGNOSIS — E782 Mixed hyperlipidemia: Secondary | ICD-10-CM | POA: Diagnosis not present

## 2022-12-30 DIAGNOSIS — E039 Hypothyroidism, unspecified: Secondary | ICD-10-CM | POA: Diagnosis present

## 2022-12-30 DIAGNOSIS — N1832 Chronic kidney disease, stage 3b: Secondary | ICD-10-CM | POA: Diagnosis not present

## 2022-12-30 DIAGNOSIS — M869 Osteomyelitis, unspecified: Principal | ICD-10-CM

## 2022-12-30 DIAGNOSIS — M109 Gout, unspecified: Secondary | ICD-10-CM | POA: Diagnosis present

## 2022-12-30 DIAGNOSIS — R41841 Cognitive communication deficit: Secondary | ICD-10-CM | POA: Diagnosis not present

## 2022-12-30 DIAGNOSIS — Z794 Long term (current) use of insulin: Secondary | ICD-10-CM

## 2022-12-30 DIAGNOSIS — Z79890 Hormone replacement therapy (postmenopausal): Secondary | ICD-10-CM

## 2022-12-30 DIAGNOSIS — E871 Hypo-osmolality and hyponatremia: Secondary | ICD-10-CM | POA: Insufficient documentation

## 2022-12-30 DIAGNOSIS — Z888 Allergy status to other drugs, medicaments and biological substances status: Secondary | ICD-10-CM

## 2022-12-30 DIAGNOSIS — Z90710 Acquired absence of both cervix and uterus: Secondary | ICD-10-CM

## 2022-12-30 DIAGNOSIS — F419 Anxiety disorder, unspecified: Secondary | ICD-10-CM | POA: Diagnosis not present

## 2022-12-30 DIAGNOSIS — Z7984 Long term (current) use of oral hypoglycemic drugs: Secondary | ICD-10-CM

## 2022-12-30 DIAGNOSIS — F32A Depression, unspecified: Secondary | ICD-10-CM | POA: Diagnosis not present

## 2022-12-30 DIAGNOSIS — L03115 Cellulitis of right lower limb: Secondary | ICD-10-CM | POA: Diagnosis not present

## 2022-12-30 DIAGNOSIS — I509 Heart failure, unspecified: Secondary | ICD-10-CM | POA: Diagnosis not present

## 2022-12-30 DIAGNOSIS — S91301D Unspecified open wound, right foot, subsequent encounter: Secondary | ICD-10-CM | POA: Diagnosis not present

## 2022-12-30 DIAGNOSIS — I10 Essential (primary) hypertension: Secondary | ICD-10-CM | POA: Diagnosis not present

## 2022-12-30 DIAGNOSIS — Z5986 Financial insecurity: Secondary | ICD-10-CM

## 2022-12-30 DIAGNOSIS — M7989 Other specified soft tissue disorders: Secondary | ICD-10-CM | POA: Diagnosis not present

## 2022-12-30 DIAGNOSIS — E1122 Type 2 diabetes mellitus with diabetic chronic kidney disease: Secondary | ICD-10-CM | POA: Diagnosis present

## 2022-12-30 DIAGNOSIS — R609 Edema, unspecified: Secondary | ICD-10-CM | POA: Diagnosis not present

## 2022-12-30 DIAGNOSIS — R739 Hyperglycemia, unspecified: Secondary | ICD-10-CM | POA: Diagnosis not present

## 2022-12-30 DIAGNOSIS — F0390 Unspecified dementia without behavioral disturbance: Secondary | ICD-10-CM | POA: Insufficient documentation

## 2022-12-30 DIAGNOSIS — M19071 Primary osteoarthritis, right ankle and foot: Secondary | ICD-10-CM | POA: Diagnosis not present

## 2022-12-30 DIAGNOSIS — Z7901 Long term (current) use of anticoagulants: Secondary | ICD-10-CM

## 2022-12-30 DIAGNOSIS — E785 Hyperlipidemia, unspecified: Secondary | ICD-10-CM | POA: Diagnosis not present

## 2022-12-30 DIAGNOSIS — G4733 Obstructive sleep apnea (adult) (pediatric): Secondary | ICD-10-CM | POA: Diagnosis not present

## 2022-12-30 DIAGNOSIS — M6281 Muscle weakness (generalized): Secondary | ICD-10-CM | POA: Diagnosis not present

## 2022-12-30 DIAGNOSIS — Z981 Arthrodesis status: Secondary | ICD-10-CM

## 2022-12-30 DIAGNOSIS — E11621 Type 2 diabetes mellitus with foot ulcer: Principal | ICD-10-CM | POA: Diagnosis present

## 2022-12-30 DIAGNOSIS — Z993 Dependence on wheelchair: Secondary | ICD-10-CM

## 2022-12-30 DIAGNOSIS — D72829 Elevated white blood cell count, unspecified: Secondary | ICD-10-CM | POA: Insufficient documentation

## 2022-12-30 DIAGNOSIS — Z79899 Other long term (current) drug therapy: Secondary | ICD-10-CM

## 2022-12-30 DIAGNOSIS — S81801A Unspecified open wound, right lower leg, initial encounter: Secondary | ICD-10-CM | POA: Diagnosis not present

## 2022-12-30 DIAGNOSIS — Z8673 Personal history of transient ischemic attack (TIA), and cerebral infarction without residual deficits: Secondary | ICD-10-CM

## 2022-12-30 DIAGNOSIS — M79672 Pain in left foot: Secondary | ICD-10-CM | POA: Diagnosis not present

## 2022-12-30 DIAGNOSIS — I129 Hypertensive chronic kidney disease with stage 1 through stage 4 chronic kidney disease, or unspecified chronic kidney disease: Secondary | ICD-10-CM | POA: Diagnosis present

## 2022-12-30 DIAGNOSIS — F0280 Dementia in other diseases classified elsewhere without behavioral disturbance: Secondary | ICD-10-CM | POA: Diagnosis not present

## 2022-12-30 DIAGNOSIS — M86172 Other acute osteomyelitis, left ankle and foot: Secondary | ICD-10-CM | POA: Diagnosis not present

## 2022-12-30 DIAGNOSIS — R2681 Unsteadiness on feet: Secondary | ICD-10-CM | POA: Diagnosis not present

## 2022-12-30 DIAGNOSIS — M79673 Pain in unspecified foot: Secondary | ICD-10-CM | POA: Diagnosis not present

## 2022-12-30 DIAGNOSIS — Z882 Allergy status to sulfonamides status: Secondary | ICD-10-CM

## 2022-12-30 DIAGNOSIS — L0390 Cellulitis, unspecified: Secondary | ICD-10-CM | POA: Diagnosis not present

## 2022-12-30 LAB — BLOOD GAS, VENOUS
Acid-Base Excess: 1.7 mmol/L (ref 0.0–2.0)
Bicarbonate: 25.7 mmol/L (ref 20.0–28.0)
Drawn by: 4237
O2 Saturation: 67.6 %
Patient temperature: 37.3
pCO2, Ven: 37 mmHg — ABNORMAL LOW (ref 44–60)
pH, Ven: 7.45 — ABNORMAL HIGH (ref 7.25–7.43)
pO2, Ven: 39 mmHg (ref 32–45)

## 2022-12-30 LAB — BASIC METABOLIC PANEL
Anion gap: 12 (ref 5–15)
BUN: 39 mg/dL — ABNORMAL HIGH (ref 8–23)
CO2: 23 mmol/L (ref 22–32)
Calcium: 8.4 mg/dL — ABNORMAL LOW (ref 8.9–10.3)
Chloride: 94 mmol/L — ABNORMAL LOW (ref 98–111)
Creatinine, Ser: 1.44 mg/dL — ABNORMAL HIGH (ref 0.44–1.00)
GFR, Estimated: 37 mL/min — ABNORMAL LOW (ref >60)
Glucose, Bld: 322 mg/dL — ABNORMAL HIGH (ref 70–99)
Potassium: 3.7 mmol/L (ref 3.5–5.1)
Sodium: 129 mmol/L — ABNORMAL LOW (ref 135–145)

## 2022-12-30 LAB — CBC
HCT: 34.6 % — ABNORMAL LOW (ref 36.0–46.0)
Hemoglobin: 10.7 g/dL — ABNORMAL LOW (ref 12.0–15.0)
MCH: 29.2 pg (ref 26.0–34.0)
MCHC: 30.9 g/dL (ref 30.0–36.0)
MCV: 94.3 fL (ref 80.0–100.0)
Platelets: 190 10*3/uL (ref 150–400)
RBC: 3.67 MIL/uL — ABNORMAL LOW (ref 3.87–5.11)
RDW: 15.9 % — ABNORMAL HIGH (ref 11.5–15.5)
WBC: 15.7 10*3/uL — ABNORMAL HIGH (ref 4.0–10.5)
nRBC: 0 % (ref 0.0–0.2)

## 2022-12-30 LAB — LACTIC ACID, PLASMA: Lactic Acid, Venous: 0.9 mmol/L (ref 0.5–1.9)

## 2022-12-30 LAB — SEDIMENTATION RATE: Sed Rate: 68 mm/hr — ABNORMAL HIGH (ref 0–22)

## 2022-12-30 LAB — BETA-HYDROXYBUTYRIC ACID: Beta-Hydroxybutyric Acid: 0.06 mmol/L (ref 0.05–0.27)

## 2022-12-30 MED ORDER — SODIUM CHLORIDE 0.9 % IV SOLN
1.0000 g | Freq: Once | INTRAVENOUS | Status: AC
  Administered 2022-12-30: 1 g via INTRAVENOUS
  Filled 2022-12-30: qty 10

## 2022-12-30 MED ORDER — SODIUM CHLORIDE 0.9 % IV BOLUS
1000.0000 mL | Freq: Once | INTRAVENOUS | Status: AC
  Administered 2022-12-30: 1000 mL via INTRAVENOUS

## 2022-12-30 MED ORDER — ACETAMINOPHEN 650 MG RE SUPP: 650.0000 mg | Freq: Four times a day (QID) | RECTAL | Status: DC | PRN

## 2022-12-30 MED ORDER — ONDANSETRON HCL 4 MG/2ML IJ SOLN: 4.0000 mg | Freq: Four times a day (QID) | INTRAMUSCULAR | Status: DC | PRN

## 2022-12-30 MED ORDER — ACETAMINOPHEN 325 MG PO TABS
650.0000 mg | ORAL_TABLET | Freq: Four times a day (QID) | ORAL | Status: DC | PRN
  Administered 2022-12-31 – 2023-01-02 (×3): 650 mg via ORAL
  Filled 2022-12-30 (×3): qty 2

## 2022-12-30 MED ORDER — ONDANSETRON HCL 4 MG PO TABS: 4.0000 mg | ORAL_TABLET | Freq: Four times a day (QID) | ORAL | Status: DC | PRN

## 2022-12-30 NOTE — ED Provider Notes (Signed)
[place] EMERGENCY DEPARTMENT AT [facility] Provider Note   CSN: [id number] Arrival date & time: 12/30/22  1658     History  Chief Complaint  Patient presents with   Foot Pain    Left foot    Tina Hester is a 82 y.o. female with medical history of chronic kidney disease stage III, diabetes, gout, joint pain, paroxysmal A-fib, pulmonary hypertension.  Patient presents to ED for evaluation of left foot cellulitis, diabetic foot wound to right heel.  Patient daughter presents with the patient and provides majority of the history.  Per patient daughter, 1 month ago was livingin [facility]. Patient had problems with left foot then. Patient sent to hospital in [facility] and they thought it was cellulitis. Treated her with abx and kept in hospital a few days. DC to skilled nursing. Treated her with abx, giving her PT at SNF. Patient daughter came and got patient before they were raedy to DC her due to financial issues. Patient could ambulate, uses walker at baseline. Patient now staying locally in assisted living. Three days ago patient began having pain, unable to ambulate on left foot. Does have appointment with podiatry on 5/9. Nurses at facility felt like it needs assessment.  Patient denies body aches or chills, nausea or vomiting, fevers.  Patient daughter also concerned about diabetic foot wound to right heel.  Patient daughter is unsure when his foot wound..  She denies any drainage from it.   Foot Pain       Home Medications Prior to Admission medications   Medication Sig Start Date End Date Taking? Authorizing Provider  acetaminophen (TYLENOL) 325 MG tablet Take by mouth every 6 (six) hours as needed for mild pain or moderate pain.    [provider]  acyclovir (ZOVIRAX) 400 MG tablet Take 400 mg by mouth 2 (two) times daily.  09/03/15   [provider]  ALPRAZolam Prudy Feeler) 0.25 MG tablet Take 0.25 mg by mouth at bedtime as needed for anxiety.     [provider]  amLODipine (NORVASC) 5 MG tablet TAKE ONE TABLET BY MOUTH EVERY MORNING 01/29/22   Quintella Reichert, MD  atorvastatin (LIPITOR) 40 MG tablet TAKE ONE TABLET BY MOUTH EVERY EVENING 01/29/22   Quintella Reichert, MD  atropine 1 % ophthalmic ointment 1 application 2 (two) times daily.    [provider]  Blood Glucose Monitoring Suppl (FREESTYLE FREEDOM LITE) w/Device KIT 1 each by Does not apply route in the morning and at bedtime. Use Freestyle lite meter to check blood sugar twice daily. 12/12/19   Reather Littler, MD  CALCIUM PO Take 1 tablet by mouth daily. 600 mg    [provider]  Cholecalciferol 2000 units TABS Take 4,000 Units by mouth daily.    [provider]  COLCRYS 0.6 MG tablet Take 0.6 mg by mouth daily as needed (gout). 05/22/13   [provider]  ELIQUIS 5 MG TABS tablet TAKE ONE TABLET BY MOUTH TWICE DAILY 07/15/22   Quintella Reichert, MD  erythromycin ophthalmic ointment 1 application 4 (four) times daily.    [provider]  gabapentin (NEURONTIN) 100 MG capsule Take 100-200 mg by mouth at bedtime.     [provider]  glucose blood (FREESTYLE LITE) test strip Use to test blood sugar twice daily. Dx: E11.9 Patient not taking: Reported on 10/27/2021 12/12/19   Reather Littler, MD  hydrochlorothiazide (HYDRODIURIL) 25 MG tablet TAKE ONE TABLET BY MOUTH  EVERY MORNING 01/29/22   Quintella Reichert, MD  hydrocortisone cream 1 % Apply 1 application topically daily as needed for itching.    [provider]  icosapent Ethyl (VASCEPA) 1 g capsule Take 2 capsules (2 g total) by mouth 2 (two) times daily. 12/21/21   Quintella Reichert, MD  Insulin NPH, Human,, Isophane, (NOVOLIN N FLEXPEN) 100 UNIT/ML Kiwkpen Inject 15 units at morning and 18 units at night Patient not taking: Reported on 11/20/2021 09/24/21   Reather Littler, MD  insulin regular (NOVOLIN R) 100 units/mL injection Inject into the skin 3 (three) times daily before meals. 8  -10 units    [provider]  levothyroxine (SYNTHROID) 150 MCG tablet TAKE ONE TABLET BY MOUTH BEFORE BREAKFAST 07/28/22   Reather Littler, MD  magnesium 30 MG tablet Take 30 mg by mouth at bedtime.    [provider]  metFORMIN (GLUCOPHAGE) 500 MG tablet TAKE ONE TABLET BY MOUTH EVERY MORNING and TAKE TWO TABLETS BY MOUTH EVERY EVENING 07/28/22   Reather Littler, MD  metoprolol tartrate (LOPRESSOR) 50 MG tablet Take 1 tablet (50 mg total) by mouth 2 (two) times daily. 05/07/22   Quintella Reichert, MD  NEEDLE, DISP, 30 G (BD DISP NEEDLES) 30G X 1/2" MISC 1 each by Does not apply route 2 (two) times daily before a meal. 07/28/22   Reather Littler, MD  [facility] DELICA LANCETS 33G MISC Use to check blood sugars once daily 10/08/13   Reather Littler, MD  OZEMPIC, 0.25 OR 0.5 MG/DOSE, 2 MG/3ML SOPN Inject 0.5mg  into THE SKIN WEEKLY 08/16/22   Reather Littler, MD  pioglitazone (ACTOS) 15 MG tablet Take 1 tablet (15 mg total) by mouth every morning. 07/28/22   Reather Littler, MD  prednisoLONE acetate (PRED FORTE) 1 % ophthalmic suspension Place 2 drops into the right eye daily.  Patient not taking: Reported on 09/10/2021 03/23/17   [provider]  Semaglutide, 1 MG/DOSE, (OZEMPIC, 1 MG/DOSE,) 2 MG/1.5ML SOPN Inject 1 mg into the skin once a week. 06/02/20   Reather Littler, MD  timolol (TIMOPTIC) 0.5 % ophthalmic solution Place 1 drop into both eyes daily. 10/21/15   [provider]  TOUJEO SOLOSTAR 300 UNIT/ML Solostar Pen Inject 30 units into THE SKIN ONCE daily 07/21/22   Reather Littler, MD  valACYclovir (VALTREX) 1000 MG tablet Take by mouth. 10/20/21   [provider]  vitamin B-12 (CYANOCOBALAMIN) 1000 MCG tablet Take 1,000 mcg by mouth daily. Patient not taking: Reported on 11/20/2021    [provider]  vitamin C (ASCORBIC ACID) 500 MG tablet Take 500 mg by mouth daily. Patient not taking: Reported on 11/20/2021    [provider]      Allergies    Pneumococcal vaccine,  Pneumovax 23 [pneumococcal vac polyvalent], Other, Sulfa antibiotics, and Pregabalin    Review of Systems   Review of Systems  Constitutional:  Negative for fever.  Skin:  Positive for color change.  All other systems reviewed and are negative.   Physical Exam Updated Vital Signs BP (!) 145/52   Pulse 94   Temp 99.1 F (37.3 C) (Oral)   Resp 17   Ht 5\' 1"  (1.549 m)   Wt 95 kg   SpO2 96%   BMI 39.57 kg/m  Physical Exam Vitals and nursing note reviewed.  Constitutional:      General: She is not in acute distress.    Appearance: Normal appearance. She is not ill-appearing, toxic-appearing or diaphoretic.  HENT:  Head: Normocephalic and atraumatic.     Nose: Nose normal.     Mouth/Throat:     Mouth: Mucous membranes are moist.     Pharynx: Oropharynx is clear.  Eyes:     Extraocular Movements: Extraocular movements intact.     Conjunctiva/sclera: Conjunctivae normal.     Pupils: Pupils are equal, round, and reactive to light.  Cardiovascular:     Rate and Rhythm: Normal rate and regular rhythm.  Pulmonary:     Effort: Pulmonary effort is normal.     Breath sounds: Normal breath sounds. No wheezing.  Abdominal:     General: Abdomen is flat. Bowel sounds are normal.     Palpations: Abdomen is soft.     Tenderness: There is no abdominal tenderness.  Musculoskeletal:     Cervical back: Normal range of motion and neck supple.  Skin:    General: Skin is warm and dry.     Capillary Refill: Capillary refill takes less than 2 seconds.     Comments: Erythema without fluctuance or induration to dorsal surface of patient left foot.  Diabetic foot wound to patient right heel.  Neurological:     Mental Status: She is alert and oriented to person, place, and time.          ED Results / Procedures / Treatments   Labs (all labs ordered are listed, but only abnormal results are displayed) Labs Reviewed  CBC - Abnormal; Notable for the following components:      Result  Value   WBC 15.7 (*)    RBC 3.67 (*)    Hemoglobin 10.7 (*)    HCT 34.6 (*)    RDW 15.9 (*)    All other components within normal limits  BASIC METABOLIC PANEL - Abnormal; Notable for the following components:   Sodium 129 (*)    Chloride 94 (*)    Glucose, Bld 322 (*)    BUN 39 (*)    Creatinine, Ser 1.44 (*)    Calcium 8.4 (*)    GFR, Estimated 37 (*)    All other components within normal limits  SEDIMENTATION RATE  C-REACTIVE PROTEIN  BLOOD GAS, VENOUS  BETA-HYDROXYBUTYRIC ACID    EKG None  Radiology DG Foot Complete Right  Result Date: 12/30/2022 CLINICAL DATA:  Open wound in the skin EXAM: RIGHT FOOT COMPLETE - 3+ VIEW COMPARISON:  None Available. FINDINGS: No displaced fracture or dislocation is seen. Osteopenia is seen in the bony structures. Degenerative changes are noted in first metatarsophalangeal joint and dorsal aspect of the intertarsal and tarsometatarsal joints. There is a large plantar spur in calcaneus. Calcification is seen at the attachment of Achilles tendon to the calcaneus. IMPRESSION: No recent fracture or dislocation is seen. There are scattered foci of lucencies in the bony structures, possibly due to osteopenia. There is no break in the cortical margins. If there is clinical suspicion for osteomyelitis, follow-up MRI may be considered. Other findings as described in the body of the report. Electronically Signed   By: Ernie Avena M.D.   On: 12/30/2022 18:40    Procedures Procedures   Medications Ordered in ED Medications  cefTRIAXone (ROCEPHIN) 1 g in sodium chloride 0.9 % 100 mL IVPB (has no administration in time range)  sodium chloride 0.9 % bolus 1,000 mL (has no administration in time range)    ED Course/ Medical Decision Making/ A&P  Medical Decision Making  82 year old female presents to the ED for evaluation.  Please see HPI  for further details.  On exam patient is afebrile and nontachycardic.  Her lung sounds are clear  bilaterally, she is not hypoxic.  Her abdomen is soft and compressible throughout.  Neurological examination at baseline.  Patient does have erythema to dorsal surface of patient left foot.  There is a diabetic foot wound to the patient right heel.  Lab work collected to include CBC, BMP.  We will x-ray the patient's right foot.  Will give her 1 g ceftriaxone for suspected cellulitis.  At the end my shift the patient workup is not complete.  Patient be signed out to oncoming provider Ysidro Evert, PA-C for further management.   Final Clinical Impression(s) / ED Diagnoses Final diagnoses:  Foot pain, left    Rx / DC Orders ED Discharge Orders     None         Clent Ridges 12/30/22 1903    Bethann Berkshire, MD 01/01/23 1239

## 2022-12-30 NOTE — ED Notes (Signed)
Pt daughter leaving for the night, informed that pt would probably be admitted tonight and she could call back in the morning for any updates.

## 2022-12-30 NOTE — ED Notes (Signed)
Pt has water on bedside table- call bell within reach

## 2022-12-30 NOTE — ED Provider Notes (Signed)
Accepted handoff at shift change from Delice Bison, PA-C. Please see prior provider note for more detail. Briefly: Patient is 82 y.o. with past medical history CKD, diabetes, gout, A-fib, pulmonary hypertension who presents to the ED complaining of bilateral extremity pain.  She complains of possible cellulitis to the left lower leg and foot.  She states that she was recently evaluated for this before moving from [facility] and finished a course of antibiotics.  Due to financial constraint, patient's daughter relocated her locally and she is currently in a SNF.  At baseline, patient is able to ambulate with a walker but over the last few days has had increasing difficulty doing so.  It was also noted that she had a wound to her right heel.  This is suspected to be a diabetic foot wound.  X-ray shows concern for osteomyelitis.  Additional workup ordered for further evaluation.  Prior to transition of care, patient was given dose of Rocephin.  Blood cultures were not obtained.  She does report that she generally feels better following antibiotic administration.  Examine wounds and do have a concern for osteomyelitis.  Patient notes that she had an MRI completed before relocating but this was over a month ago and before the onset of her worsening pain and difficulty ambulating.  She does have a leukocytosis of 15.7 and the sed rate of 68.  Unfortunately, MRI unavailable at any pain at this time of night and will consult for admission due to suspicion of osteomyelitis.  Patient agreeable with plan for admission.  Hospitalist consulted for admission and sepsis.  Patient being admitted for possible osteomyelitis of the right heel.  On my examination of patient, she has minimal erythema and increased warmth to the left lower leg and foot.  Neurovascularly intact to both lower extremities.  I have a low suspicion for an underlying cellulitis but she does have a leukocytosis and a history of recurrent cellulitis to  the left leg.  She was given a dose of Rocephin by previous provider which should cover for this.  Primary reason for admission is to rule out osteomyelitis in the right heel.  Patient stable at time of admission.    Richardson Dopp 12/30/22 2234    Bethann Berkshire, MD 01/01/23 1239

## 2022-12-30 NOTE — H&P (Addendum)
History and Physical    Patient: Tina Hester ZOX:[id number] DOB: Apr 22, 1941 DOA: 12/30/2022 DOS: the patient was seen and examined on 12/31/2022 PCP: [name], MD  Patient coming from: SNF   Chief Complaint:  Chief Complaint  Patient presents with   Foot Pain    Left foot   HPI: Tina Hester is a 82 y.o. female with medical history significant of hypertension, hyperlipidemia, atrial fibrillation, hypothyroidism, GERD, type 2 diabetes who presents to the emergency department via [facility] [place] due to 1 week onset of left foot pain with difficulty in ambulation and now ambulates with a wheelchair (normally ambulates with a walker at baseline).  Patient states that she was recently evaluated for cellulitis of the left leg prior to relocating from [facility], [place] and she completed a course of antibiotics.  She also complained of an incidental finding of a wound to her right heel, she states that she does not know when she sustained the wound or how long the wound has been there due to numbness in the foot.  ED Course:  In the emergency department, BP was 140/51, other vital signs were within normal range.  Workup in the ED showed WBC 15.7, hemoglobin 10.7, hematocrit 34.6, MCV 94.3, platelets 190.  BMP showed sodium 129, potassium 3.7, chloride 94, bicarb 23, blood glucose 322, BUN/creatinine 39.1.44 (creatinine is within baseline range), EGFR 37.  Beta hydroxybutyric acid. Right foot x-ray showed no recent fracture or dislocation is seen. There are scattered foci of lucencies in the bony structures, possibly due to osteopenia. There is no break in the cortical margins. Patient was empirically treated with IV ceftriaxone, IV NS 1 L was given hospitalist was asked to admit patient for further evaluation and management.  Review of Systems: Review of systems as noted in the HPI. All other systems reviewed and are negative.   Past Medical History:   Diagnosis Date   Anemia    s/p Heme work -up normal EGD and colonscopy in 2012 per pt,neg SPEP   Anxiety    Aortic stenosis    Mild by echo 12/2021 with mean aortic valve gradient 17 mmHg   Bilateral carotid artery stenosis 06/08/2015   1-39% bilateral   Bradycardia    CKD (chronic kidney disease), stage III (HCC)    Diabetes mellitus without complication (HCC)    type 2   GERD (gastroesophageal reflux disease)    Gout    HSV-1 (herpes simplex virus 1) infection    Acyclovir prn   Hyperkalemia    Hyperlipidemia    Hypertension    Joint pain    osteoarthritis by Xray- possible erosion of R 4th MCP,elevated uric  acid ,ANA +    MCI (mild cognitive impairment)    Morbid obesity (HCC)    OSA on CPAP    Osteopenia    PAF (paroxysmal atrial fibrillation) (HCC)    Pain management    Neurosurg: Dr Odette Fraction   Pulmonary HTN (HCC)    Moderate by echo  2016. [facility]   TIA (transient ischemic attack)    remote history of TIA's early 2000   Past Surgical History:  Procedure Laterality Date   ABDOMINAL HYSTERECTOMY     APPENDECTOMY     BACK SURGERY     cervical fusion   BLADDER SURGERY     CESAREAN SECTION     CHOLECYSTECTOMY      Social History:  reports that she has never smoked.  She has never used smokeless tobacco. She reports that she does not drink alcohol and does not use drugs.   Allergies  Allergen Reactions   Pneumococcal Vaccine Anaphylaxis    weakness   Pneumovax 23 [Pneumococcal Vac Polyvalent] Anaphylaxis    weakness   Other Hives    Other reaction(s): Other (See Comments) Pneumonia  Vaccine- very ill   Sulfa Antibiotics Hives   Pregabalin     Other reaction(s): depression    Family History  Problem Relation Age of Onset   Arrhythmia Brother     Prior to Admission medications   Medication Sig Start Date End Date Taking? Authorizing Provider  acetaminophen (TYLENOL) 325 MG tablet Take 500 mg by mouth every 4 (four) hours as needed for mild  pain or moderate pain.   Yes [provider]  amLODipine (NORVASC) 5 MG tablet TAKE ONE TABLET BY MOUTH EVERY MORNING 01/29/22  Yes Turner, Cornelious Bryant, MD  atorvastatin (LIPITOR) 40 MG tablet TAKE ONE TABLET BY MOUTH EVERY EVENING 01/29/22  Yes Quintella Reichert, MD  Cholecalciferol 2000 units TABS Take 400 Units by mouth daily.   Yes [provider]  clotrimazole (LOTRIMIN) 1 % cream Apply 1 Application topically 2 (two) times daily.   Yes [provider]  donepezil (ARICEPT) 5 MG tablet Take 5 mg by mouth daily. 12/17/22  Yes [provider]  ELIQUIS 5 MG TABS tablet TAKE ONE TABLET BY MOUTH TWICE DAILY 07/15/22  Yes Turner, Cornelious Bryant, MD  ezetimibe (ZETIA) 10 MG tablet Take 10 mg by mouth daily. 12/17/22  Yes [provider]  gabapentin (NEURONTIN) 100 MG capsule Take 600 mg by mouth 3 (three) times daily.   Yes [provider]  glipiZIDE (GLUCOTROL) 10 MG tablet Take 10 mg by mouth daily before breakfast.   Yes [provider]  insulin glargine (LANTUS SOLOSTAR) 100 UNIT/ML Solostar Pen Inject 15 Units into the skin daily.   Yes [provider]  insulin lispro (HUMALOG) 100 UNIT/ML injection Inject 1-7 Units into the skin 3 (three) times daily before meals. Sliding scale 141-180=1 unit,181-220= 2 units,221-260= 3 units,261-300=4 units, 301-340=5 units, 341-380=6 units, 381-400 = 7 units,> 400 call MD   Yes [provider]  levothyroxine (SYNTHROID) 150 MCG tablet TAKE ONE TABLET BY MOUTH BEFORE BREAKFAST 07/28/22  Yes Reather Littler, MD  olmesartan (BENICAR) 20 MG tablet Take 20 mg by mouth daily. 12/17/22  Yes [provider]  omeprazole (PRILOSEC) 20 MG capsule Take 20 mg by mouth daily. 12/17/22  Yes [provider]  pioglitazone (ACTOS) 15 MG tablet Take 1 tablet (15 mg total) by mouth every morning. 07/28/22  Yes Reather Littler, MD  sertraline (ZOLOFT) 50 MG tablet Take 50 mg by mouth daily. 12/17/22  Yes [provider]  vitamin B-12 (CYANOCOBALAMIN) 1000 MCG tablet Take 1,000 mcg by mouth daily.   Yes [provider]  ALPRAZolam (XANAX) 0.25 MG tablet Take 0.25 mg by mouth at bedtime as needed for anxiety.    [provider]  Blood Glucose Monitoring Suppl (FREESTYLE FREEDOM LITE) w/Device KIT 1 each by Does not apply route in the morning and at bedtime. Use Freestyle lite meter to check blood sugar twice daily. 12/12/19   Reather Littler, MD  CALCIUM PO Take 1 tablet by mouth daily. 600 mg    [provider]  COLCRYS 0.6 MG tablet Take 0.6 mg by mouth daily as needed (gout). 05/22/13   [provider]  dronedarone Karie Soda)  400 MG tablet     [provider]  erythromycin ophthalmic ointment 1 application 4 (four) times daily.    [provider]  glucose blood (FREESTYLE LITE) test strip Use to test blood sugar twice daily. Dx: E11.9 Patient not taking: Reported on 10/27/2021 12/12/19   Reather Littler, MD  hydrochlorothiazide (HYDRODIURIL) 25 MG tablet TAKE ONE TABLET BY MOUTH EVERY MORNING 01/29/22   Quintella Reichert, MD  HYDROcodone-acetaminophen (NORCO/VICODIN) 5-325 MG tablet hydrocodone 5 mg-acetaminophen 325 mg tablet  Take 1 tablet 3 times a day by oral route as needed. Patient not taking: Reported on 12/30/2022    [provider]  Insulin NPH, Human,, Isophane, (NOVOLIN N FLEXPEN) 100 UNIT/ML Kiwkpen Inject 15 units at morning and 18 units at night Patient not taking: Reported on 11/20/2021 09/24/21   Reather Littler, MD  insulin regular (NOVOLIN R) 100 units/mL injection Inject into the skin 3 (three) times daily before meals. 8 -10 units Patient not taking: Reported on 12/30/2022    [provider]  magnesium 30 MG tablet Take 30 mg by mouth at bedtime. Patient not taking: Reported on 12/30/2022    [provider]  metFORMIN (GLUCOPHAGE) 500 MG tablet TAKE ONE TABLET BY MOUTH EVERY MORNING and TAKE TWO TABLETS BY MOUTH EVERY EVENING  07/28/22   Reather Littler, MD  metoprolol tartrate (LOPRESSOR) 50 MG tablet Take 1 tablet (50 mg total) by mouth 2 (two) times daily. 05/07/22   Quintella Reichert, MD  NEEDLE, DISP, 30 G (BD DISP NEEDLES) 30G X 1/2" MISC 1 each by Does not apply route 2 (two) times daily before a meal. 07/28/22   Reather Littler, MD  [facility] DELICA LANCETS 33G MISC Use to check blood sugars once daily 10/08/13   Reather Littler, MD  OZEMPIC, 0.25 OR 0.5 MG/DOSE, 2 MG/3ML SOPN Inject 0.5mg  into THE SKIN WEEKLY Patient not taking: Reported on 12/30/2022 08/16/22   Reather Littler, MD  TOUJEO SOLOSTAR 300 UNIT/ML Solostar Pen Inject 30 units into THE SKIN ONCE daily 07/21/22   Reather Littler, MD    Physical Exam: BP (!) 131/54 (BP Location: Right Arm)   Pulse 74   Temp 98.2 F (36.8 C) (Oral)   Resp 16   Ht 5\' 1"  (1.549 m)   Wt 93.3 kg   SpO2 100%   BMI 38.86 kg/m   General: 82 y.o. year-old female well developed well nourished in no acute distress.  Alert and oriented x3. HEENT: NCAT, EOMI Neck: Supple, trachea medial Cardiovascular: Regular rate and rhythm with no rubs or gallops.  No thyromegaly or JVD noted.  No lower extremity edema. 2/4 pulses in all 4 extremities. Respiratory: Clear to auscultation with no wheezes or rales. Good inspiratory effort. Abdomen: Soft, nontender nondistended with normal bowel sounds x4 quadrants. Muskuloskeletal: Left leg swelling > right leg.  Right heel diabetic foot ulcer.   Neuro: CN II-XII intact, strength 5/5 x 4, sensation, reflexes intact Skin: No ulcerative lesions noted or rashes Psychiatry: Judgement and insight appear normal. Mood is appropriate for condition and setting            Labs on Admission:  Basic Metabolic Panel: Recent Labs  Lab 12/30/22 1816  NA 129*  K 3.7  CL 94*  CO2 23  GLUCOSE 322*  BUN 39*  CREATININE 1.44*  CALCIUM 8.4*   Liver Function Tests: No results for input(s): "AST", "ALT", "ALKPHOS", "BILITOT", "PROT", "ALBUMIN" in the last 168  hours. No results for input(s): "LIPASE", "AMYLASE"  in the last 168 hours. No results for input(s): "AMMONIA" in the last 168 hours. CBC: Recent Labs  Lab 12/30/22 1816  WBC 15.7*  HGB 10.7*  HCT 34.6*  MCV 94.3  PLT 190   Cardiac Enzymes: No results for input(s): "CKTOTAL", "CKMB", "CKMBINDEX", "TROPONINI" in the last 168 hours.  BNP (last 3 results) No results for input(s): "BNP" in the last 8760 hours.  ProBNP (last 3 results) No results for input(s): "PROBNP" in the last 8760 hours.  CBG: No results for input(s): "GLUCAP" in the last 168 hours.  Radiological Exams on Admission: DG Foot Complete Right  Result Date: 12/30/2022 CLINICAL DATA:  Open wound in the skin EXAM: RIGHT FOOT COMPLETE - 3+ VIEW COMPARISON:  None Available. FINDINGS: No displaced fracture or dislocation is seen. Osteopenia is seen in the bony structures. Degenerative changes are noted in first metatarsophalangeal joint and dorsal aspect of the intertarsal and tarsometatarsal joints. There is a large plantar spur in calcaneus. Calcification is seen at the attachment of Achilles tendon to the calcaneus. IMPRESSION: No recent fracture or dislocation is seen. There are scattered foci of lucencies in the bony structures, possibly due to osteopenia. There is no break in the cortical margins. If there is clinical suspicion for osteomyelitis, follow-up MRI may be considered. Other findings as described in the body of the report. Electronically Signed   By: Ernie Avena M.D.   On: 12/30/2022 18:40    EKG: I independently viewed the EKG done and my findings are as followed: EKG was not done in the ED  Assessment/Plan Present on Admission:  Open wound of right heel  Chronic kidney disease, stage 3b (HCC)  Essential hypertension  Persistent atrial fibrillation (HCC)  Mixed hyperlipidemia  Acquired hypothyroidism  Principal Problem:   Open wound of right heel Active Problems:   Mixed hyperlipidemia    Essential hypertension   Persistent atrial fibrillation (HCC)   Chronic kidney disease, stage 3b (HCC)   Acquired hypothyroidism   Left leg swelling   Leukocytosis   Hyponatremia   Type 2 diabetes mellitus with hyperglycemia (HCC)   GERD (gastroesophageal reflux disease)   Dementia without behavioral disturbance (HCC)  Open wound of right heel, rule out osteomyelitis IV ceftriaxone was empirically given, continue IV vancomycin MRI with and without contrast of right foot in the morning Continue gentle hydration to prevent contrast-induced kidney injury Wound nurse will be consulted and we shall await further recommendations  Left leg swelling Patient complained of left leg pain with difficulty in being able to ambulate Left leg was noted to be more swollen than right leg, lower extremity ultrasound will be done in the morning Continue PT eval and treat  Leukocytosis possibly reactive vs infectious WBC 15.7, empiric IV ceftriaxone was given in the ED Consider further antibiotic treatment based on MRI findings of osteomyelitis   Hyponatremia Na 129, this is possibly secondary to diuretic use Continue gentle hydration and monitor sodium levels.  Type 2 diabetes mellitus with hyperglycemia Continue Semglee 10 units nightly and adjust dose accordingly Continue ISS and hypoglycemia protocol  Chronic kidney disease stage IIIb BUN/creatinine 39/1.44 (creatinine is within baseline range) Renally adjust medications, avoid nephrotoxic agents/dehydration/hypotension  Essential hypertension Continue amlodipine and Avapro  Mixed hyperlipidemia Continue Lipitor and Zetia  Persistent atrial fibrillation Continue Eliquis Metoprolol temporarily held due to patient being currently bradycardic  GERD Continue Protonix  Acquired hypothyroidism Continue Synthroid  Depression Continue Zoloft  Dementia Continue Aricept   DVT prophylaxis: Eliquis  Advance  Care Planning: Full  code  Consults: None  Family Communication: None at bedside  Severity of Illness: The appropriate patient status for this patient is INPATIENT. Inpatient status is judged to be reasonable and necessary in order to provide the required intensity of service to ensure the patient's safety. The patient's presenting symptoms, physical exam findings, and initial radiographic and laboratory data in the context of their chronic comorbidities is felt to place them at high risk for further clinical deterioration. Furthermore, it is not anticipated that the patient will be medically stable for discharge from the hospital within 2 midnights of admission.   * I certify that at the point of admission it is my clinical judgment that the patient will require inpatient hospital care spanning beyond 2 midnights from the point of admission due to high intensity of service, high risk for further deterioration and high frequency of surveillance required.*  Author: Frankey Shown, DO 12/31/2022 3:37 AM  For on call review [email or web address].

## 2022-12-30 NOTE — ED Triage Notes (Signed)
Pt BIB [facility] from [facility] in [place] for left foot pain that has been ongoing for over a week, pt was dx with cellulitis in left foot, reports completing anbx, pain still present.

## 2022-12-31 ENCOUNTER — Inpatient Hospital Stay (HOSPITAL_COMMUNITY): Payer: PPO

## 2022-12-31 ENCOUNTER — Ambulatory Visit (HOSPITAL_COMMUNITY): Payer: HMO

## 2022-12-31 DIAGNOSIS — S91301A Unspecified open wound, right foot, initial encounter: Secondary | ICD-10-CM | POA: Diagnosis not present

## 2022-12-31 DIAGNOSIS — D72829 Elevated white blood cell count, unspecified: Secondary | ICD-10-CM | POA: Insufficient documentation

## 2022-12-31 DIAGNOSIS — E1165 Type 2 diabetes mellitus with hyperglycemia: Secondary | ICD-10-CM | POA: Insufficient documentation

## 2022-12-31 DIAGNOSIS — K219 Gastro-esophageal reflux disease without esophagitis: Secondary | ICD-10-CM | POA: Insufficient documentation

## 2022-12-31 DIAGNOSIS — E871 Hypo-osmolality and hyponatremia: Secondary | ICD-10-CM | POA: Insufficient documentation

## 2022-12-31 DIAGNOSIS — M7989 Other specified soft tissue disorders: Secondary | ICD-10-CM | POA: Insufficient documentation

## 2022-12-31 DIAGNOSIS — F0390 Unspecified dementia without behavioral disturbance: Secondary | ICD-10-CM | POA: Insufficient documentation

## 2022-12-31 LAB — GLUCOSE, CAPILLARY
Glucose-Capillary: 117 mg/dL — ABNORMAL HIGH (ref 70–99)
Glucose-Capillary: 242 mg/dL — ABNORMAL HIGH (ref 70–99)
Glucose-Capillary: 220 mg/dL — ABNORMAL HIGH (ref 70–99)
Glucose-Capillary: 293 mg/dL — ABNORMAL HIGH (ref 70–99)

## 2022-12-31 LAB — COMPREHENSIVE METABOLIC PANEL
ALT: 13 U/L (ref 0–44)
AST: 19 U/L (ref 15–41)
Albumin: 2.6 g/dL — ABNORMAL LOW (ref 3.5–5.0)
Alkaline Phosphatase: 69 U/L (ref 38–126)
Anion gap: 8 (ref 5–15)
BUN: 36 mg/dL — ABNORMAL HIGH (ref 8–23)
CO2: 24 mmol/L (ref 22–32)
Calcium: 8.2 mg/dL — ABNORMAL LOW (ref 8.9–10.3)
Chloride: 102 mmol/L (ref 98–111)
Creatinine, Ser: 1.07 mg/dL — ABNORMAL HIGH (ref 0.44–1.00)
GFR, Estimated: 52 mL/min — ABNORMAL LOW (ref >60)
Glucose, Bld: 126 mg/dL — ABNORMAL HIGH (ref 70–99)
Potassium: 3.4 mmol/L — ABNORMAL LOW (ref 3.5–5.1)
Sodium: 134 mmol/L — ABNORMAL LOW (ref 135–145)
Total Bilirubin: 0.8 mg/dL (ref 0.3–1.2)
Total Protein: 6.1 g/dL — ABNORMAL LOW (ref 6.5–8.1)

## 2022-12-31 LAB — PHOSPHORUS: Phosphorus: 3.3 mg/dL (ref 2.5–4.6)

## 2022-12-31 LAB — CBC
HCT: 29.3 % — ABNORMAL LOW (ref 36.0–46.0)
Hemoglobin: 9.3 g/dL — ABNORMAL LOW (ref 12.0–15.0)
MCH: 29.2 pg (ref 26.0–34.0)
MCHC: 31.7 g/dL (ref 30.0–36.0)
MCV: 91.8 fL (ref 80.0–100.0)
Platelets: 170 10*3/uL (ref 150–400)
RBC: 3.19 MIL/uL — ABNORMAL LOW (ref 3.87–5.11)
RDW: 15.7 % — ABNORMAL HIGH (ref 11.5–15.5)
WBC: 10.7 10*3/uL — ABNORMAL HIGH (ref 4.0–10.5)
nRBC: 0 % (ref 0.0–0.2)

## 2022-12-31 LAB — URIC ACID: Uric Acid, Serum: 8.8 mg/dL — ABNORMAL HIGH (ref 2.5–7.1)

## 2022-12-31 LAB — MAGNESIUM: Magnesium: 1.7 mg/dL (ref 1.7–2.4)

## 2022-12-31 LAB — MRSA NEXT GEN BY PCR, NASAL: MRSA by PCR Next Gen: DETECTED — AB

## 2022-12-31 LAB — C-REACTIVE PROTEIN: CRP: 14.7 mg/dL — ABNORMAL HIGH (ref <1.0)

## 2022-12-31 MED ORDER — NYSTATIN 100000 UNIT/GM EX POWD
Freq: Two times a day (BID) | CUTANEOUS | Status: DC
  Administered 2023-01-01 – 2023-01-03 (×2): 1 via TOPICAL
  Filled 2022-12-31: qty 15

## 2022-12-31 MED ORDER — POTASSIUM CHLORIDE CRYS ER 20 MEQ PO TBCR
40.0000 meq | EXTENDED_RELEASE_TABLET | Freq: Once | ORAL | Status: AC
  Administered 2022-12-31: 40 meq via ORAL
  Filled 2022-12-31: qty 2

## 2022-12-31 MED ORDER — VANCOMYCIN HCL 750 MG/150ML IV SOLN
750.0000 mg | INTRAVENOUS | Status: DC
  Administered 2022-12-31 – 2023-01-03 (×4): 750 mg via INTRAVENOUS
  Filled 2022-12-31 (×4): qty 150

## 2022-12-31 MED ORDER — MUPIROCIN CALCIUM 2 % EX CREA
TOPICAL_CREAM | Freq: Two times a day (BID) | CUTANEOUS | Status: DC
  Filled 2022-12-31: qty 15

## 2022-12-31 MED ORDER — IRBESARTAN 150 MG PO TABS
150.0000 mg | ORAL_TABLET | Freq: Every day | ORAL | Status: DC
  Administered 2022-12-31 – 2023-01-03 (×4): 150 mg via ORAL
  Filled 2022-12-31 (×4): qty 1

## 2022-12-31 MED ORDER — LEVOTHYROXINE SODIUM 75 MCG PO TABS
150.0000 ug | ORAL_TABLET | Freq: Every day | ORAL | Status: DC
  Administered 2022-12-31 – 2023-01-03 (×4): 150 ug via ORAL
  Filled 2022-12-31 (×4): qty 2

## 2022-12-31 MED ORDER — INSULIN GLARGINE-YFGN 100 UNIT/ML ~~LOC~~ SOLN
10.0000 [IU] | Freq: Every day | SUBCUTANEOUS | Status: DC
  Administered 2022-12-31 – 2023-01-03 (×3): 10 [IU] via SUBCUTANEOUS
  Filled 2022-12-31 (×4): qty 0.1

## 2022-12-31 MED ORDER — DONEPEZIL HCL 5 MG PO TABS
5.0000 mg | ORAL_TABLET | Freq: Every day | ORAL | Status: DC
  Administered 2022-12-31 – 2023-01-03 (×4): 5 mg via ORAL
  Filled 2022-12-31 (×4): qty 1

## 2022-12-31 MED ORDER — AMLODIPINE BESYLATE 5 MG PO TABS
5.0000 mg | ORAL_TABLET | Freq: Every morning | ORAL | Status: DC
  Administered 2022-12-31 – 2023-01-03 (×4): 5 mg via ORAL
  Filled 2022-12-31 (×4): qty 1

## 2022-12-31 MED ORDER — SODIUM CHLORIDE 0.9 % IV SOLN: INTRAVENOUS | Status: AC

## 2022-12-31 MED ORDER — EZETIMIBE 10 MG PO TABS
10.0000 mg | ORAL_TABLET | Freq: Every day | ORAL | Status: DC
  Administered 2022-12-31 – 2023-01-03 (×4): 10 mg via ORAL
  Filled 2022-12-31 (×4): qty 1

## 2022-12-31 MED ORDER — APIXABAN 5 MG PO TABS
5.0000 mg | ORAL_TABLET | Freq: Two times a day (BID) | ORAL | Status: DC
  Administered 2022-12-31 – 2023-01-03 (×7): 5 mg via ORAL
  Filled 2022-12-31 (×7): qty 1

## 2022-12-31 MED ORDER — GADOBUTROL 1 MMOL/ML IV SOLN
9.0000 mL | Freq: Once | INTRAVENOUS | Status: AC | PRN
  Administered 2022-12-31: 9 mL via INTRAVENOUS

## 2022-12-31 MED ORDER — ATORVASTATIN CALCIUM 40 MG PO TABS
40.0000 mg | ORAL_TABLET | Freq: Every day | ORAL | Status: DC
  Administered 2022-12-31 – 2023-01-03 (×4): 40 mg via ORAL
  Filled 2022-12-31 (×4): qty 1

## 2022-12-31 MED ORDER — COLCHICINE 0.6 MG PO TABS
0.6000 mg | ORAL_TABLET | Freq: Once | ORAL | Status: AC
  Administered 2022-12-31: 0.6 mg via ORAL
  Filled 2022-12-31: qty 1

## 2022-12-31 MED ORDER — SERTRALINE HCL 50 MG PO TABS
50.0000 mg | ORAL_TABLET | Freq: Every day | ORAL | Status: DC
  Administered 2022-12-31 – 2023-01-03 (×4): 50 mg via ORAL
  Filled 2022-12-31 (×4): qty 1

## 2022-12-31 MED ORDER — PANTOPRAZOLE SODIUM 40 MG PO TBEC
40.0000 mg | DELAYED_RELEASE_TABLET | Freq: Every day | ORAL | Status: DC
  Administered 2022-12-31 – 2023-01-03 (×4): 40 mg via ORAL
  Filled 2022-12-31 (×4): qty 1

## 2022-12-31 MED ORDER — COLCHICINE 0.6 MG PO TABS
1.2000 mg | ORAL_TABLET | Freq: Once | ORAL | Status: AC
  Administered 2022-12-31: 1.2 mg via ORAL
  Filled 2022-12-31: qty 2

## 2022-12-31 MED ORDER — INSULIN ASPART 100 UNIT/ML IJ SOLN
0.0000 [IU] | Freq: Every day | INTRAMUSCULAR | Status: DC
  Administered 2022-12-31: 3 [IU] via SUBCUTANEOUS

## 2022-12-31 MED ORDER — MUPIROCIN 2 % EX OINT
TOPICAL_OINTMENT | Freq: Two times a day (BID) | CUTANEOUS | Status: DC
  Administered 2023-01-01 – 2023-01-03 (×3): 1 via NASAL
  Filled 2022-12-31: qty 22

## 2022-12-31 MED ORDER — INSULIN ASPART 100 UNIT/ML IJ SOLN
0.0000 [IU] | Freq: Three times a day (TID) | INTRAMUSCULAR | Status: DC
  Administered 2022-12-31 (×2): 5 [IU] via SUBCUTANEOUS
  Administered 2023-01-01 (×2): 2 [IU] via SUBCUTANEOUS
  Administered 2023-01-01: 3 [IU] via SUBCUTANEOUS
  Administered 2023-01-02 (×3): 2 [IU] via SUBCUTANEOUS
  Administered 2023-01-03 (×2): 3 [IU] via SUBCUTANEOUS

## 2022-12-31 NOTE — Consult Note (Signed)
WOC Nurse Consult Note: Reason for Consult:Right heel Unstageable pressure injury Wound type:Pressure Pressure Injury POA: Yes Measurement: 3cm x 1cm Wound AOZ:HYQMV stable eschar Drainage (amount, consistency, odor) none Periwound:intact Dressing procedure/placement/frequency: I have provided Nursing with guidance for the topical care of this pressure injury using a daily soap and water cleanse followed by rinsing and drying then applying povidone-iodine and allowing it to air dry. A dry dressing is to be applied and secured with a few turns of Kerlix roll gauze/paper tape. Feet are to be placed into pressure redistribution heel boots (Prevalon).  Turning and repositioning is in place; a sacral foam is to be placed for PI prevention in that region.  WOC nursing team will not follow, but will remain available to this patient, the nursing and medical teams.  Please re-consult if needed.  Thank you for inviting [place] to participate in this patient's Plan of Care.  Ladona Mow, MSN, RN, CNS, GNP, Leda Min, [employer], [employer] phone:  [phone]

## 2022-12-31 NOTE — Progress Notes (Signed)
Pharmacy Antibiotic Note  Tina Hester is a 82 y.o. female admitted on 12/30/2022 with cellulitis.  Pharmacy has been consulted for Vancomycin dosing. WBC 10.7. Renal function age appropriate.   Plan: Vancomycin 750 mg IV q24h >>>Estimated AUC: 534 Trend WBC, temp, renal function  F/U infectious work-up Drug levels as indicated   Height: 5\' 1"  (154.9 cm) Weight: 93.3 kg (205 lb 11 oz) IBW/kg (Calculated) : 47.8  Temp (24hrs), Avg:98.5 F (36.9 C), Min:98.2 F (36.8 C), Max:99.1 F (37.3 C)  Recent Labs  Lab 12/30/22 1816 12/30/22 2222 12/31/22 0514  WBC 15.7*  --  10.7*  CREATININE 1.44*  --  1.07*  LATICACIDVEN  --  0.9  --     Estimated Creatinine Clearance: 43 mL/min (A) (by C-G formula based on SCr of 1.07 mg/dL (H)).    Allergies  Allergen Reactions   Pneumococcal Vaccine Anaphylaxis    weakness   Pneumovax 23 [Pneumococcal Vac Polyvalent] Anaphylaxis    weakness   Other Hives    Other reaction(s): Other (See Comments) Pneumonia  Vaccine- very ill   Sulfa Antibiotics Hives   Pregabalin     Other reaction(s): depression    Abran Duke, PharmD, BCPS Clinical Pharmacist Phone: [phone]

## 2022-12-31 NOTE — Progress Notes (Signed)
Noted during patients assessment that patient has redness under left breast and abdominal fold, and perineum. MD Sherryll Burger made aware. New orders placed.

## 2022-12-31 NOTE — Progress Notes (Signed)
PROGRESS NOTE    Tina Hester  ZOX:[id number] DOB: 04-23-1941 DOA: 12/30/2022 PCP: [name], MD   Brief Narrative:  Tina Hester is a 82 y.o. female with medical history significant of hypertension, hyperlipidemia, atrial fibrillation, hypothyroidism, GERD, type 2 diabetes who presents to the emergency department via [facility] [place] due to 1 week onset of left foot pain with difficulty in ambulation and now ambulates with a wheelchair (normally ambulates with a walker at baseline).  Patient was admitted for evaluation of open wound to her right heel as well as left lower extremity edema.  Studies are negative for DVT and there is no sign of osteomyelitis.  She was started on IV vancomycin for treatment of cellulitis.  She is now complaining of severe pain that appears to be gout related.  Assessment & Plan:   Principal Problem:   Open wound of right heel Active Problems:   Mixed hyperlipidemia   Essential hypertension   Persistent atrial fibrillation (HCC)   Chronic kidney disease, stage 3b (HCC)   Acquired hypothyroidism   Left leg swelling   Leukocytosis   Hyponatremia   Type 2 diabetes mellitus with hyperglycemia (HCC)   GERD (gastroesophageal reflux disease)   Dementia without behavioral disturbance (HCC)  Assessment and Plan:  Open wound of right heel, rule out osteomyelitis IV ceftriaxone was empirically given, continue IV vancomycin MRI with and without contrast of right foot in the morning Appreciate wound consultation   Left leg swelling DVT ultrasound negative PT reccommending SNF Possible acute gout flare and started on colchicine and check uric acid   Leukocytosis possibly reactive vs infectious WBC 15.7, empiric IV ceftriaxone was given in the ED Consider further antibiotic treatment based on MRI findings of osteomyelitis    Hyponatremia-improved Na 129, this is possibly secondary to diuretic use Continue gentle hydration  and monitor sodium levels.  Mild hypokalemia Replete and reevaluate in a.m.   Type 2 diabetes mellitus with hyperglycemia Continue Semglee 10 units nightly and adjust dose accordingly Continue ISS and hypoglycemia protocol   Chronic kidney disease stage IIIb BUN/creatinine 39/1.44 (creatinine is within baseline range) Renally adjust medications, avoid nephrotoxic agents/dehydration/hypotension   Essential hypertension Continue amlodipine and Avapro   Mixed hyperlipidemia Continue Lipitor and Zetia   Persistent atrial fibrillation Continue Eliquis Metoprolol temporarily held due to patient being currently bradycardic   GERD Continue Protonix   Acquired hypothyroidism Continue Synthroid   Depression Continue Zoloft   Dementia Continue Aricept  Obesity BMI 38.86    DVT prophylaxis:Eliquis Code Status: Full Family Communication: Daughter at bedside 5/3 Disposition Plan:  Status is: Inpatient Remains inpatient appropriate because: Need for IV medications.   Consultants:  None  Procedures:  None  Antimicrobials:  Anti-infectives (From admission, onward)    Start     Dose/Rate Route Frequency Ordered Stop   12/31/22 0745  vancomycin (VANCOREADY) IVPB 750 mg/150 mL        750 mg 150 mL/hr over 60 Minutes Intravenous Every 24 hours 12/31/22 0646     12/30/22 1815  cefTRIAXone (ROCEPHIN) 1 g in sodium chloride 0.9 % 100 mL IVPB        1 g 200 mL/hr over 30 Minutes Intravenous  Once 12/30/22 1807 12/30/22 2018       Subjective: Patient seen and evaluated today with ongoing pain to bilateral lower extremities.  Objective: Vitals:   12/30/22 2340 12/31/22 0242 12/31/22 0834 12/31/22 0947  BP: (!) 122/95 (!) 131/54 (!) 116/52 114/76  Pulse: 69 74 83   Resp: 16 16 18    Temp: 98.3 F (36.8 C) 98.2 F (36.8 C) 99.2 F (37.3 C)   TempSrc: Oral Oral Oral   SpO2: 100% 100% 97%   Weight: 93.3 kg     Height: 5\' 1"  (1.549 m)       Intake/Output Summary  (Last 24 hours) at 12/31/2022 1301 Last data filed at 12/31/2022 0525 Gross per 24 hour  Intake 1263.52 ml  Output --  Net 1263.52 ml   Filed Weights   12/30/22 1724 12/30/22 2340  Weight: 95 kg 93.3 kg    Examination:  General exam: Appears calm and comfortable  Respiratory system: Clear to auscultation. Respiratory effort normal. Cardiovascular system: S1 & S2 heard, RRR.  Gastrointestinal system: Abdomen is soft Central nervous system: Alert and awake Extremities: Bilateral heels and dressings C/D/I Skin: No significant lesions noted Psychiatry: Flat affect.    Data Reviewed: I have personally reviewed following labs and imaging studies  CBC: Recent Labs  Lab 12/30/22 1816 12/31/22 0514  WBC 15.7* 10.7*  HGB 10.7* 9.3*  HCT 34.6* 29.3*  MCV 94.3 91.8  PLT 190 170   Basic Metabolic Panel: Recent Labs  Lab 12/30/22 1816 12/31/22 0514  NA 129* 134*  K 3.7 3.4*  CL 94* 102  CO2 23 24  GLUCOSE 322* 126*  BUN 39* 36*  CREATININE 1.44* 1.07*  CALCIUM 8.4* 8.2*  MG  --  1.7  PHOS  --  3.3   GFR: Estimated Creatinine Clearance: 43 mL/min (A) (by C-G formula based on SCr of 1.07 mg/dL (H)). Liver Function Tests: Recent Labs  Lab 12/31/22 0514  AST 19  ALT 13  ALKPHOS 69  BILITOT 0.8  PROT 6.1*  ALBUMIN 2.6*   No results for input(s): "LIPASE", "AMYLASE" in the last 168 hours. No results for input(s): "AMMONIA" in the last 168 hours. Coagulation Profile: No results for input(s): "INR", "PROTIME" in the last 168 hours. Cardiac Enzymes: No results for input(s): "CKTOTAL", "CKMB", "CKMBINDEX", "TROPONINI" in the last 168 hours. BNP (last 3 results) No results for input(s): "PROBNP" in the last 8760 hours. HbA1C: No results for input(s): "HGBA1C" in the last 72 hours. CBG: Recent Labs  Lab 12/31/22 0729 12/31/22 1121  GLUCAP 117* 242*   Lipid Profile: No results for input(s): "CHOL", "HDL", "LDLCALC", "TRIG", "CHOLHDL", "LDLDIRECT" in the last 72  hours. Thyroid Function Tests: No results for input(s): "TSH", "T4TOTAL", "FREET4", "T3FREE", "THYROIDAB" in the last 72 hours. Anemia Panel: No results for input(s): "VITAMINB12", "FOLATE", "FERRITIN", "TIBC", "IRON", "RETICCTPCT" in the last 72 hours. Sepsis Labs: Recent Labs  Lab 12/30/22 2222  LATICACIDVEN 0.9    Recent Results (from the past 240 hour(s))  MRSA Next Gen by PCR, Nasal     Status: Abnormal   Collection Time: 12/31/22 12:18 AM   Specimen: Nasal Mucosa; Nasal Swab  Result Value Ref Range Status   MRSA by PCR Next Gen DETECTED (A) NOT DETECTED Final    Comment: RESULT CALLED TO, READ BACK BY AND VERIFIED WITH: BROWN @ 0622 ON [id number] BY HENDERSON L (NOTE) The GeneXpert MRSA Assay (FDA approved for NASAL specimens only), is one component of a comprehensive MRSA colonization surveillance program. It is not intended to diagnose MRSA infection nor to guide or monitor treatment for MRSA infections. Test performance is not FDA approved in patients less than 74 years old. Performed at [facility], [address], [place], [place] [address]  Radiology Studies: US Venous Img Lower Bilateral (DVT)  Result Date: 12/31/2022 CLINICAL DATA:  [id number] Leg swelling [id number] EXAM: BILATERAL LOWER EXTREMITY VENOUS DOPPLER ULTRASOUND TECHNIQUE: Gray-scale sonography with graded compression, as well as color Doppler and duplex ultrasound were performed to evaluate the lower extremity deep venous systems from the level of the common femoral vein and including the common femoral, femoral, profunda femoral, popliteal and calf veins including the posterior tibial, peroneal and gastrocnemius veins when visible. The superficial great saphenous vein was also interrogated. Spectral Doppler was utilized to evaluate flow at rest and with distal augmentation maneuvers in the common femoral, femoral and popliteal veins. COMPARISON:  Foot XRs 12/30/2018. FINDINGS: RIGHT LOWER EXTREMITY VENOUS  Normal compressibility of the RIGHT common femoral, superficial femoral, and popliteal veins, as well as the visualized calf veins. Visualized portions of profunda femoral vein and great saphenous vein unremarkable. No filling defects to suggest DVT on grayscale or color Doppler imaging. Doppler waveforms show normal direction of venous flow, normal respiratory plasticity and response to augmentation. OTHER No evidence of superficial thrombophlebitis or abnormal fluid collection. Limitations: none LEFT LOWER EXTREMITY VENOUS Normal compressibility of the LEFT common femoral, superficial femoral, and popliteal veins, as well as the visualized calf veins. Visualized portions of profunda femoral vein and great saphenous vein unremarkable. No filling defects to suggest DVT on grayscale or color Doppler imaging. Doppler waveforms show normal direction of venous flow, normal respiratory plasticity and response to augmentation. OTHER No evidence of superficial thrombophlebitis or abnormal fluid collection. Limitations: none IMPRESSION: No evidence of femoropopliteal DVT or superficial thrombophlebitis within either lower extremity. Roanna Banning, MD [facility] Radiology Electronically Signed   By: Roanna Banning M.D.   On: 12/31/2022 11:11   MR FOOT RIGHT W WO CONTRAST  Result Date: 12/31/2022 CLINICAL DATA:  Osteomyelitis, foot. Foot pain with difficulty ambulating for 1 week. Heel wound. EXAM: MRI OF THE RIGHT FOOT WITHOUT AND WITH CONTRAST TECHNIQUE: Multiplanar, multisequence MR imaging of the right hindfoot was performed before and after the administration of intravenous contrast. CONTRAST:  9mL GADAVIST GADOBUTROL 1 MMOL/ML IV SOLN COMPARISON:  Radiographs 12/30/2022 FINDINGS: Despite efforts by the technologist and patient, moderate motion artifact is present on today's exam and could not be eliminated. This reduces exam sensitivity and specificity.  Bones/Joint/Cartilage There is no evidence of acute fracture or dislocation. No cortical destruction, suspicious marrow signal or abnormal enhancement to suggest osteomyelitis. There are moderate midfoot degenerative changes with osteophytes and subchondral edema, greatest at the 2nd and 3rd tarsometatarsal joints. No significant joint effusions. Ligaments The major ankle ligaments appear intact. The Lisfranc ligament is intact. Muscles and Tendons Ankle tendons appear intact without significant tenosynovitis. No focal muscular abnormalities are identified. There is mild thickening of the central cord of the plantar facet adjacent to a plantar calcaneal spur. No associated T2 hyperintensity or abnormal enhancement to suggest active plantar fasciitis. Soft tissues No obvious soft tissue wound is demonstrated by this motion degraded examination. There is mild generalized subcutaneous edema. There are probable small incidental ganglia projecting dorsally from the Lisfranc joint, best seen on the coronal and sagittal images. IMPRESSION: 1. Moderately motion degraded examination. 2. No evidence of osteomyelitis or septic arthritis. 3. Moderate midfoot degenerative changes, greatest at the 2nd and 3rd tarsometatarsal joints. 4. Probable small incidental ganglia projecting dorsally from the Lisfranc joint. Electronically Signed   By: Carey Bullocks M.D.   On: 12/31/2022 09:05   DG Foot Complete Right  Result  Date: 12/30/2022 CLINICAL DATA:  Open wound in the skin EXAM: RIGHT FOOT COMPLETE - 3+ VIEW COMPARISON:  None Available. FINDINGS: No displaced fracture or dislocation is seen. Osteopenia is seen in the bony structures. Degenerative changes are noted in first metatarsophalangeal joint and dorsal aspect of the intertarsal and tarsometatarsal joints. There is a large plantar spur in calcaneus. Calcification is seen at the attachment of Achilles tendon to the calcaneus. IMPRESSION: No recent fracture or dislocation is  seen. There are scattered foci of lucencies in the bony structures, possibly due to osteopenia. There is no break in the cortical margins. If there is clinical suspicion for osteomyelitis, follow-up MRI may be considered. Other findings as described in the body of the report. Electronically Signed   By: Ernie Avena M.D.   On: 12/30/2022 18:40        Scheduled Meds:  amLODipine  5 mg Oral q morning   apixaban  5 mg Oral BID   atorvastatin  40 mg Oral Daily   colchicine  0.6 mg Oral Once   colchicine  1.2 mg Oral Once   donepezil  5 mg Oral Daily   ezetimibe  10 mg Oral Daily   insulin aspart  0-15 Units Subcutaneous TID WC   insulin aspart  0-5 Units Subcutaneous QHS   insulin glargine-yfgn  10 Units Subcutaneous QHS   irbesartan  150 mg Oral Daily   levothyroxine  150 mcg Oral Q0600   mupirocin ointment   Nasal BID   nystatin   Topical BID   pantoprazole  40 mg Oral Daily   sertraline  50 mg Oral Daily   Continuous Infusions:  sodium chloride 100 mL/hr at 12/31/22 0340   vancomycin 750 mg (12/31/22 1006)     LOS: 1 day    Time spent: 35 minutes    Jari Dipasquale Hoover Brunette, DO [facility]  If 7PM-7AM, please contact night-coverage [email or web address] 12/31/2022, 1:01 PM

## 2022-12-31 NOTE — [place] FL2 (Signed)
[place] MEDICAID FL2 LEVEL OF CARE FORM     IDENTIFICATION  Patient Name: Tina Hester Birthdate: 1941-06-02 Sex: female Admission Date (Current Location): 12/30/2022  [facility] and [place] Number:  [employer] and Address:  [facility],  [address], [place] [address]      Provider Number: [id number]  Attending Physician Name and Address:  Erick Blinks, DO  Relative Name and Phone Number:  Vella Kohler (Daughter) [phone]    Current Level of Care: Hospital Recommended Level of Care: Skilled Nursing Facility Prior Approval Number:    Date Approved/Denied:   PASRR Number: [id number]  Discharge Plan: SNF    Current Diagnoses: Patient Active Problem List   Diagnosis Date Noted   Left leg swelling 12/31/2022   Leukocytosis 12/31/2022   Hyponatremia 12/31/2022   Type 2 diabetes mellitus with hyperglycemia (HCC) 12/31/2022   GERD (gastroesophageal reflux disease) 12/31/2022   Dementia without behavioral disturbance (HCC) 12/31/2022   Open wound of right heel 12/30/2022   Hematoma of lower leg 12/18/2018   Low back pain 12/18/2018   Contusion of left knee 06/16/2018   Pain in left knee 06/16/2018   Rib pain 06/16/2018   Glaucoma suspect of left eye 12/22/2017   Secondary open-angle glaucoma of right eye, indeterminate stage 12/22/2017   Postural dizziness with presyncope 12/13/2017   Chest pain 12/13/2017   Hyperkalemia 12/13/2017   Acute kidney injury (HCC) 12/13/2017   Fluid overload 12/13/2017   Bradycardia 12/13/2017   CHF (congestive heart failure) (HCC) 12/13/2017   Aortic stenosis 11/02/2016   Aortic valve disorder 11/02/2016   Abrasion of right cornea 04/12/2016   Status post corneal transplant 04/12/2016   Pulmonary HTN (HCC) 12/15/2015   Bilateral carotid artery stenosis 06/08/2015   Essential hypertension    OSA on CPAP    Persistent atrial fibrillation (HCC)    Morbid obesity (HCC) 09/04/2013   Gout  09/04/2013   DM2 (diabetes mellitus, type 2) (HCC) 05/29/2013   Mixed hyperlipidemia 05/29/2013   Anxiety 01/31/2012   Chronic kidney disease, stage 3b (HCC) 01/31/2012   Diabetes mellitus type 2 with complications, uncontrolled 01/31/2012   Acquired hypothyroidism 01/31/2012   TIA (transient ischemic attack) 01/31/2012    Orientation RESPIRATION BLADDER Height & Weight     Self, Place  Normal External catheter Weight: 205 lb 11 oz (93.3 kg) Height:  5\' 1"  (154.9 cm)  BEHAVIORAL SYMPTOMS/MOOD NEUROLOGICAL BOWEL NUTRITION STATUS      Incontinent Diet  AMBULATORY STATUS COMMUNICATION OF NEEDS Skin   Extensive Assist Verbally PU Stage and Appropriate Care (right heel, unstagable)                       Personal Care Assistance Level of Assistance  Bathing, Feeding, Dressing Bathing Assistance: Limited assistance Feeding assistance: Independent Dressing Assistance: Limited assistance     Functional Limitations Info  Sight, Hearing, Speech Sight Info: Adequate Hearing Info: Adequate Speech Info: Adequate    SPECIAL CARE FACTORS FREQUENCY  PT (By licensed PT)     PT Frequency: 5x/week              Contractures Contractures Info: Not present    Additional Factors Info  Code Status, Allergies Code Status Info: Full Code Allergies Info: Pneumococcal Vaccine, Pneumovax 23 (Pneumococcal Vac Polyvalent), Other, Sulfa Antibiotics, Pregabalin           Current Medications (12/31/2022):  This is the current hospital active medication list Current Facility-Administered Medications  Medication Dose Route Frequency Provider Last Rate Last Admin   acetaminophen (TYLENOL) tablet 650 mg  650 mg Oral Q6H PRN Adefeso, Oladapo, DO       Or   acetaminophen (TYLENOL) suppository 650 mg  650 mg Rectal Q6H PRN Adefeso, Oladapo, DO       amLODipine (NORVASC) tablet 5 mg  5 mg Oral q morning Adefeso, Oladapo, DO   5 mg at 12/31/22 0947   apixaban (ELIQUIS) tablet 5 mg  5 mg Oral  BID Adefeso, Oladapo, DO   5 mg at 12/31/22 0948   atorvastatin (LIPITOR) tablet 40 mg  40 mg Oral Daily Adefeso, Oladapo, DO   40 mg at 12/31/22 0947   donepezil (ARICEPT) tablet 5 mg  5 mg Oral Daily Adefeso, Oladapo, DO   5 mg at 12/31/22 0948   ezetimibe (ZETIA) tablet 10 mg  10 mg Oral Daily Adefeso, Oladapo, DO   10 mg at 12/31/22 0948   insulin aspart (novoLOG) injection 0-15 Units  0-15 Units Subcutaneous TID WC Adefeso, Oladapo, DO   5 Units at 12/31/22 1217   insulin aspart (novoLOG) injection 0-5 Units  0-5 Units Subcutaneous QHS Adefeso, Oladapo, DO       insulin glargine-yfgn (SEMGLEE) injection 10 Units  10 Units Subcutaneous QHS Adefeso, Oladapo, DO       irbesartan (AVAPRO) tablet 150 mg  150 mg Oral Daily Adefeso, Oladapo, DO   150 mg at 12/31/22 0947   levothyroxine (SYNTHROID) tablet 150 mcg  150 mcg Oral Q0600 Adefeso, Oladapo, DO   150 mcg at 12/31/22 0507   mupirocin ointment (BACTROBAN) 2 %   Nasal BID Maurilio Lovely D, DO   Given at 12/31/22 1003   nystatin (MYCOSTATIN/NYSTOP) topical powder   Topical BID Maurilio Lovely D, DO   Given at 12/31/22 1217   ondansetron (ZOFRAN) tablet 4 mg  4 mg Oral Q6H PRN Adefeso, Oladapo, DO       Or   ondansetron (ZOFRAN) injection 4 mg  4 mg Intravenous Q6H PRN Adefeso, Oladapo, DO       pantoprazole (PROTONIX) EC tablet 40 mg  40 mg Oral Daily Adefeso, Oladapo, DO   40 mg at 12/31/22 0948   sertraline (ZOLOFT) tablet 50 mg  50 mg Oral Daily Adefeso, Oladapo, DO   50 mg at 12/31/22 0947   vancomycin (VANCOREADY) IVPB 750 mg/150 mL  750 mg Intravenous Q24H Stevphen [place], [facility]   Stopped at 12/31/22 1204     Discharge Medications: Please see discharge summary for a list of discharge medications.  Relevant Imaging Results:  Relevant Lab Results:   Additional Information SSN [id number]. Per daughter patient has been vaccinated for covid.  Sanjit Mcmichael, Juleen China, LCSW

## 2022-12-31 NOTE — Evaluation (Signed)
Physical Therapy Evaluation Patient Details Name: Tina Hester MRN: [id number] DOB: [birth year] Today's Date: 12/31/2022  History of Present Illness  Tina Hester is a 82 y.o. female with medical history significant of hypertension, hyperlipidemia, atrial fibrillation, hypothyroidism, GERD, type 2 diabetes who presents to the emergency department via [facility] [place] due to 1 week onset of left foot pain with difficulty in ambulation and now ambulates with a wheelchair (normally ambulates with a walker at baseline).  Patient states that she was recently evaluated for cellulitis of the left leg prior to relocating from [facility], [place] and she completed a course of antibiotics.  She also complained of an incidental finding of a wound to her right heel, she states that she does not know when she sustained the wound or how long the wound has been there due to numbness in the foot.   Clinical Impression  Patient limited for functional mobility as stated below secondary to BLE weakness, fatigue and bilateral foot pain. Patient requires assist to transition to seated EOB due to general weakness. She demonstrates good sitting tolerance and sitting balance at EOB. Several attempts made at transfer to standing with RW, assist, and encouragement but patient unable due to c/o bilateral foot pain and further apprehension. Patient assisted back to supine at end of session and performs ankle pumps in limited range of motion due to stiffness and mobility limitations. Patient will benefit from continued physical therapy in hospital and recommended venue below to increase strength, balance, endurance for safe ADLs and gait.        Recommendations for follow up therapy are one component of a multi-disciplinary discharge planning process, led by the attending physician.  Recommendations may be updated based on patient status, additional functional criteria and insurance  authorization.  Follow Up Recommendations Can patient physically be transported by private vehicle: No     Assistance Recommended at Discharge Frequent or constant Supervision/Assistance  Patient can return home with the following  A lot of help with walking and/or transfers;A lot of help with bathing/dressing/bathroom    Equipment Recommendations None recommended by PT  Recommendations for Other Services       Functional Status Assessment Patient has had a recent decline in their functional status and demonstrates the ability to make significant improvements in function in a reasonable and predictable amount of time.     Precautions / Restrictions Precautions Precautions: Fall Restrictions Weight Bearing Restrictions: No      Mobility  Bed Mobility Overal bed mobility: Needs Assistance Bed Mobility: Supine to Sit, Sit to Supine     Supine to sit: Min assist, HOB elevated Sit to supine: Mod assist, Min assist   General bed mobility comments: slow, labored, use of bed rails with HOB elevated and assist to pull to seated; assist for BLE back into bed and trunk positioning    Transfers                   General transfer comment: attempted with RW and gait belt but patient pain limited with c/o bilateral foot pain    Ambulation/Gait                  Stairs            Wheelchair Mobility    Modified Rankin (Stroke Patients Only)       Balance Overall balance assessment: Needs assistance Sitting-balance support: Feet supported, No upper extremity supported Sitting balance-Leahy Scale: Good  Sitting balance - Comments: seated EOB                                     Pertinent Vitals/Pain Pain Assessment Pain Assessment: Faces Faces Pain Scale: Hurts even more Pain Location: bilateral feet Pain Descriptors / Indicators: Grimacing, Guarding Pain Intervention(s): Limited activity within patient's tolerance, Monitored during  session, Repositioned    Home Living Family/patient expects to be discharged to:: Assisted living                 Home Equipment: Agricultural consultant (2 wheels);Shower seat      Prior Function Prior Level of Function : Needs assist             Mobility Comments: Patient states she typically ambulates with RW but has not ambulated recently due to foot pain ADLs Comments: assisted     Hand Dominance        Extremity/Trunk Assessment   Upper Extremity Assessment Upper Extremity Assessment: Generalized weakness    Lower Extremity Assessment Lower Extremity Assessment: Generalized weakness    Cervical / Trunk Assessment Cervical / Trunk Assessment: Normal  Communication   Communication: No difficulties  Cognition Arousal/Alertness: Awake/alert Behavior During Therapy: WFL for tasks assessed/performed Overall Cognitive Status: Within Functional Limits for tasks assessed                                          General Comments      Exercises General Exercises - Lower Extremity Ankle Circles/Pumps: AROM, Both, 10 reps, Supine   Assessment/Plan    PT Assessment Patient needs continued PT services  PT Problem List Decreased strength;Decreased activity tolerance;Decreased balance;Decreased mobility       PT Treatment Interventions DME instruction;Balance training;Neuromuscular re-education;Gait training;Stair training;Functional mobility training;Patient/family education;Therapeutic activities;Therapeutic exercise;Manual techniques    PT Goals (Current goals can be found in the Care Plan section)  Acute Rehab PT Goals Patient Stated Goal: return to ALF PT Goal Formulation: With patient Time For Goal Achievement: 01/14/23 Potential to Achieve Goals: Good    Frequency Min 3X/week     Co-evaluation               AM-PAC PT "6 Clicks" Mobility  Outcome Measure Help needed turning from your back to your side while in a flat bed  without using bedrails?: A Little Help needed moving from lying on your back to sitting on the side of a flat bed without using bedrails?: A Little Help needed moving to and from a bed to a chair (including a wheelchair)?: A Lot Help needed standing up from a chair using your arms (e.g., wheelchair or bedside chair)?: A Lot Help needed to walk in hospital room?: A Lot Help needed climbing 3-5 steps with a railing? : A Lot 6 Click Score: 14    End of Session Equipment Utilized During Treatment: Gait belt Activity Tolerance: Patient limited by pain Patient left: in bed;with call bell/phone within reach;with bed alarm set Nurse Communication: Mobility status PT Visit Diagnosis: Unsteadiness on feet (R26.81);Other abnormalities of gait and mobility (R26.89);Muscle weakness (generalized) (M62.81)    Time: 3086-5784 PT Time Calculation (min) (ACUTE ONLY): 17 min   Charges:   PT Evaluation $PT Eval Moderate Complexity: 1 Mod PT Treatments $Therapeutic Activity: 8-22 mins  10:53 AM, 12/31/22 Wyman Songster PT, DPT Physical Therapist at [facility]

## 2022-12-31 NOTE — TOC Initial Note (Signed)
Transition of Care [facility]) - Initial/Assessment Note    Patient Details  Name: Tina Hester MRN: [id number] Date of Birth: [birth year]  Transition of Care [facility]) CM/SW Contact:    Annice Needy, LCSW Phone Number: 12/31/2022, 3:26 PM  Clinical Narrative:                 Patient admitted from [facility]. At baseline she used a walker; however, due to foot would she has been using a wc. Facility assists with ADLs. Admitted for open would of right heel. PT recommends SNF. Daughter wants her to remain in [place]. Referral sent to county facilities. Auth started.   Expected Discharge Plan: Skilled Nursing Facility Barriers to Discharge: Continued Medical Work up   Patient Goals and CMS Choice Patient states their goals for this hospitalization and ongoing recovery are:: SNF          Expected Discharge Plan and Services       Living arrangements for the past 2 months: Assisted Living Facility                                      Prior Living Arrangements/Services Living arrangements for the past 2 months: Assisted Living Facility Lives with:: Facility Resident Patient language and need for interpreter reviewed:: Yes            Current home services: DME Criminal Activity/Legal Involvement Pertinent to Current Situation/Hospitalization: No - Comment as needed  Activities of Daily Living Home Assistive Devices/Equipment: Environmental consultant (specify type) ADL Screening (condition at time of admission) Patient's cognitive ability adequate to safely complete daily activities?: Yes Is the patient deaf or have difficulty hearing?: No Does the patient have difficulty seeing, even when wearing glasses/contacts?: No Does the patient have difficulty concentrating, remembering, or making decisions?: No Patient able to express need for assistance with ADLs?: Yes Does the patient have difficulty dressing or bathing?: Yes Independently performs ADLs?: No Communication:  Independent Dressing (OT): Needs assistance Is this a change from baseline?: Pre-admission baseline Grooming: Needs assistance Is this a change from baseline?: Pre-admission baseline Feeding: Independent Bathing: Needs assistance Is this a change from baseline?: Pre-admission baseline Toileting: Needs assistance Is this a change from baseline?: Pre-admission baseline In/Out Bed: Needs assistance Is this a change from baseline?: Pre-admission baseline Walks in Home: Needs assistance Is this a change from baseline?: Pre-admission baseline Does the patient have difficulty walking or climbing stairs?: No Weakness of Legs: None Weakness of Arms/Hands: None  Permission Sought/Granted Permission sought to share information with : Family Supports    Share Information with NAME: daughter, Vella Kohler           Emotional Assessment       Orientation: : Oriented to Self, Oriented to Place Alcohol / Substance Use: Not Applicable Psych Involvement: No (comment)  Admission diagnosis:  Foot pain, left [M79.672] Cellulitis of left lower extremity [L03.116] Open wound of right heel [S91.301A] Osteomyelitis of right foot, unspecified type (HCC) [M86.9] Patient Active Problem List   Diagnosis Date Noted   Left leg swelling 12/31/2022   Leukocytosis 12/31/2022   Hyponatremia 12/31/2022   Type 2 diabetes mellitus with hyperglycemia (HCC) 12/31/2022   GERD (gastroesophageal reflux disease) 12/31/2022   Dementia without behavioral disturbance (HCC) 12/31/2022   Open wound of right heel 12/30/2022   Hematoma of lower leg 12/18/2018   Low back pain 12/18/2018   Contusion of  left knee 06/16/2018   Pain in left knee 06/16/2018   Rib pain 06/16/2018   Glaucoma suspect of left eye 12/22/2017   Secondary open-angle glaucoma of right eye, indeterminate stage 12/22/2017   Postural dizziness with presyncope 12/13/2017   Chest pain 12/13/2017   Hyperkalemia 12/13/2017   Acute kidney injury  (HCC) 12/13/2017   Fluid overload 12/13/2017   Bradycardia 12/13/2017   CHF (congestive heart failure) (HCC) 12/13/2017   Aortic stenosis 11/02/2016   Aortic valve disorder 11/02/2016   Abrasion of right cornea 04/12/2016   Status post corneal transplant 04/12/2016   Pulmonary HTN (HCC) 12/15/2015   Bilateral carotid artery stenosis 06/08/2015   Essential hypertension    OSA on CPAP    Persistent atrial fibrillation (HCC)    Morbid obesity (HCC) 09/04/2013   Gout 09/04/2013   DM2 (diabetes mellitus, type 2) (HCC) 05/29/2013   Mixed hyperlipidemia 05/29/2013   Anxiety 01/31/2012   Chronic kidney disease, stage 3b (HCC) 01/31/2012   Diabetes mellitus type 2 with complications, uncontrolled 01/31/2012   Acquired hypothyroidism 01/31/2012   TIA (transient ischemic attack) 01/31/2012   PCP:  [name], MD Pharmacy:   [facility] [phone] - [place], [place] - [address] AT [facility] [address] [place] [address] Phone: [phone] Fax: [phone]  [facility] - [place], [place] - [address] [place] [address] Phone: [phone] Fax: [phone]  [facility] - [place], [place] - [address] [place] [address] Phone: [phone] Fax: [phone]  [facility] - [facility] [place], [place] - [address] [place] [address] Phone: [phone] Fax: [phone]     Social Determinants of Health (SDOH) Social History: SDOH Screenings   Food Insecurity: No Food Insecurity (12/30/2022)  Housing: Low Risk  (12/30/2022)  Transportation Needs: No Transportation Needs (12/30/2022)  Utilities: Not At Risk (12/30/2022)  Depression (PHQ2-9): Low Risk  (06/23/2020)  Tobacco Use: Low Risk  (12/30/2022)   SDOH Interventions:     Readmission Risk Interventions      No data to display

## 2022-12-31 NOTE — Progress Notes (Signed)
Positive MRSA swab. Notified Dr. Adefeso.  

## 2022-12-31 NOTE — Plan of Care (Signed)
  Problem: Acute Rehab PT Goals(only PT should resolve) Goal: Pt Will Go Supine/Side To Sit Outcome: Progressing Flowsheets (Taken 12/31/2022 1055) Pt will go Supine/Side to Sit:  with min guard assist  with minimal assist Goal: Pt Will Go Sit To Supine/Side Outcome: Progressing Flowsheets (Taken 12/31/2022 1055) Pt will go Sit to Supine/Side:  with min guard assist  with minimal assist Goal: Patient Will Transfer Sit To/From Stand Outcome: Progressing Flowsheets (Taken 12/31/2022 1055) Patient will transfer sit to/from stand:  with moderate assist  with minimal assist Goal: Pt Will Transfer Bed To Chair/Chair To Bed Outcome: Progressing Flowsheets (Taken 12/31/2022 1055) Pt will Transfer Bed to Chair/Chair to Bed:  with mod assist  with min assist Goal: Pt/caregiver will Perform Home Exercise Program Outcome: Progressing Flowsheets (Taken 12/31/2022 1055) Pt/caregiver will Perform Home Exercise Program:  For increased strengthening  For improved balance  For increased ROM  With Supervision, verbal cues required/provided    10:56 AM, 12/31/22 Wyman Songster PT, DPT Physical Therapist at [facility]

## 2023-01-01 DIAGNOSIS — S91301A Unspecified open wound, right foot, initial encounter: Secondary | ICD-10-CM | POA: Diagnosis not present

## 2023-01-01 LAB — BASIC METABOLIC PANEL
Anion gap: 7 (ref 5–15)
BUN: 29 mg/dL — ABNORMAL HIGH (ref 8–23)
CO2: 24 mmol/L (ref 22–32)
Calcium: 8.2 mg/dL — ABNORMAL LOW (ref 8.9–10.3)
Chloride: 104 mmol/L (ref 98–111)
Creatinine, Ser: 1.07 mg/dL — ABNORMAL HIGH (ref 0.44–1.00)
GFR, Estimated: 52 mL/min — ABNORMAL LOW (ref >60)
Glucose, Bld: 132 mg/dL — ABNORMAL HIGH (ref 70–99)
Potassium: 3.9 mmol/L (ref 3.5–5.1)
Sodium: 135 mmol/L (ref 135–145)

## 2023-01-01 LAB — CBC
HCT: 28.5 % — ABNORMAL LOW (ref 36.0–46.0)
Hemoglobin: 9 g/dL — ABNORMAL LOW (ref 12.0–15.0)
MCH: 29 pg (ref 26.0–34.0)
MCHC: 31.6 g/dL (ref 30.0–36.0)
MCV: 91.9 fL (ref 80.0–100.0)
Platelets: 182 10*3/uL (ref 150–400)
RBC: 3.1 MIL/uL — ABNORMAL LOW (ref 3.87–5.11)
RDW: 15.5 % (ref 11.5–15.5)
WBC: 8.3 10*3/uL (ref 4.0–10.5)
nRBC: 0 % (ref 0.0–0.2)

## 2023-01-01 LAB — GLUCOSE, CAPILLARY
Glucose-Capillary: 139 mg/dL — ABNORMAL HIGH (ref 70–99)
Glucose-Capillary: 125 mg/dL — ABNORMAL HIGH (ref 70–99)
Glucose-Capillary: 174 mg/dL — ABNORMAL HIGH (ref 70–99)
Glucose-Capillary: 114 mg/dL — ABNORMAL HIGH (ref 70–99)

## 2023-01-01 LAB — MAGNESIUM: Magnesium: 1.7 mg/dL (ref 1.7–2.4)

## 2023-01-01 MED ORDER — ORAL CARE MOUTH RINSE: 15.0000 mL | OROMUCOSAL | Status: DC | PRN

## 2023-01-01 MED ORDER — COLCHICINE 0.6 MG PO TABS
0.6000 mg | ORAL_TABLET | Freq: Two times a day (BID) | ORAL | Status: DC
  Administered 2023-01-01 – 2023-01-03 (×5): 0.6 mg via ORAL
  Filled 2023-01-01 (×5): qty 1

## 2023-01-01 NOTE — Plan of Care (Signed)

## 2023-01-01 NOTE — TOC Progression Note (Signed)
Transition of Care [facility]) - Progression Note    Patient Details  Name: Tina Hester MRN: [id number] Date of Birth: 1940-12-04  Transition [facility]) CM/SW Contact  Catalina Gravel, [place] Phone Number: 01/01/2023, 1:28 PM  Clinical Narrative:    CSW contacted [facility]-regarding SNF Auth. Advised by Judeth Cornfield that they are still awaiting a response from medical Director. Not authorized  yet. TOC to follow.   Expected Discharge Plan: Skilled Nursing Facility Barriers to Discharge: Insurance Authorization  Expected Discharge Plan and Services       Living arrangements for the past 2 months: Assisted Living Facility                                       Social Determinants of Health (SDOH) Interventions SDOH Screenings   Food Insecurity: No Food Insecurity (12/30/2022)  Housing: Low Risk  (12/30/2022)  Transportation Needs: No Transportation Needs (12/30/2022)  Utilities: Not At Risk (12/30/2022)  Depression (PHQ2-9): Low Risk  (06/23/2020)  Tobacco Use: Low Risk  (12/30/2022)    Readmission Risk Interventions     No data to display

## 2023-01-01 NOTE — Progress Notes (Signed)
PROGRESS NOTE    Tina Hester  UJW:[id number] DOB: [birth year] DOA: 12/30/2022 PCP: [name], MD   Brief Narrative:  Tina Hester is a 82 y.o. female with medical history significant of hypertension, hyperlipidemia, atrial fibrillation, hypothyroidism, GERD, type 2 diabetes who presents to the emergency department [facility] [place] due to 1 week onset of left foot pain with difficulty in ambulation and now ambulates with a wheelchair (normally ambulates with a walker at baseline).  Patient was admitted for evaluation of open wound to her right heel as well as left lower extremity edema.  Studies are negative for DVT and there is no sign of osteomyelitis.  She was started on IV vancomycin for treatment of cellulitis.  She is now complaining of severe pain that appears to be gout related.  Assessment & Plan:   Principal Problem:   Open wound of right heel Active Problems:   Mixed hyperlipidemia   Essential hypertension   Persistent atrial fibrillation (HCC)   Chronic kidney disease, stage 3b (HCC)   Acquired hypothyroidism   Left leg swelling   Leukocytosis   Hyponatremia   Type 2 diabetes mellitus with hyperglycemia (HCC)   GERD (gastroesophageal reflux disease)   Dementia without behavioral disturbance (HCC)  Assessment and Plan:  Open wound of right heel, rule out osteomyelitis IV ceftriaxone was empirically given, continue IV vancomycin MRI with and without contrast of right foot in the morning Appreciate wound consultation   Left leg swelling with acute gout flare DVT ultrasound negative PT reccommending SNF Possible acute gout flare and started on colchicine and uric acid noted to be elevated   Leukocytosis possibly reactive vs infectious-resolving Continue monitor CBC and maintain on vancomycin No sign of osteomyelitis noted on MRI   Type 2 diabetes mellitus with hyperglycemia Continue Semglee 10 units nightly and adjust dose  accordingly Continue ISS and hypoglycemia protocol   Chronic kidney disease stage IIIb BUN/creatinine 39/1.44 (creatinine is within baseline range) Renally adjust medications, avoid nephrotoxic agents/dehydration/hypotension   Essential hypertension Continue amlodipine and Avapro   Mixed hyperlipidemia Continue Lipitor and Zetia   Persistent atrial fibrillation Continue Eliquis Metoprolol temporarily held due to patient being currently bradycardic   GERD Continue Protonix   Acquired hypothyroidism Continue Synthroid   Depression Continue Zoloft   Dementia Continue Aricept  Obesity BMI 38.86    DVT prophylaxis:Eliquis Code Status: Full Family Communication: Daughter at bedside 5/3 Disposition Plan:  Status is: Inpatient Remains inpatient appropriate because: Need for IV medications.   Consultants:  None  Procedures:  None  Antimicrobials:  Anti-infectives (From admission, onward)    Start     Dose/Rate Route Frequency Ordered Stop   12/31/22 0745  vancomycin (VANCOREADY) IVPB 750 mg/150 mL        750 mg 150 mL/hr over 60 Minutes Intravenous Every 24 hours 12/31/22 0646     12/30/22 1815  cefTRIAXone (ROCEPHIN) 1 g in sodium chloride 0.9 % 100 mL IVPB        1 g 200 mL/hr over 30 Minutes Intravenous  Once 12/30/22 1807 12/30/22 2018       Subjective: Patient seen and evaluated today with ongoing pain to bilateral lower extremities that she claims is improving.  Objective: Vitals:   12/31/22 0947 12/31/22 1327 12/31/22 2139 01/01/23 0451  BP: 114/76 121/62 (!) 125/49 (!) 107/49  Pulse:  88 96 85  Resp:  17 20 20   Temp:  98.2 F (36.8 C) 98.8 F (37.1 C)  98.2 F (36.8 C)  TempSrc:  Oral Oral Oral  SpO2:  98% 97% 97%  Weight:      Height:        Intake/Output Summary (Last 24 hours) at 01/01/2023 1045 Last data filed at 01/01/2023 0310 Gross per 24 hour  Intake 1102.98 ml  Output 700 ml  Net 402.98 ml   Filed Weights   12/30/22 1724  12/30/22 2340  Weight: 95 kg 93.3 kg    Examination:  General exam: Appears calm and comfortable  Respiratory system: Clear to auscultation. Respiratory effort normal. Cardiovascular system: S1 & S2 heard, RRR.  Gastrointestinal system: Abdomen is soft Central nervous system: Alert and awake Extremities: Bilateral heels and dressings C/D/I Skin: No significant lesions noted Psychiatry: Flat affect.    Data Reviewed: I have personally reviewed following labs and imaging studies  CBC: Recent Labs  Lab 12/30/22 1816 12/31/22 0514 01/01/23 0451  WBC 15.7* 10.7* 8.3  HGB 10.7* 9.3* 9.0*  HCT 34.6* 29.3* 28.5*  MCV 94.3 91.8 91.9  PLT 190 170 182   Basic Metabolic Panel: Recent Labs  Lab 12/30/22 1816 12/31/22 0514 01/01/23 0451  NA 129* 134* 135  K 3.7 3.4* 3.9  CL 94* 102 104  CO2 23 24 24   GLUCOSE 322* 126* 132*  BUN 39* 36* 29*  CREATININE 1.44* 1.07* 1.07*  CALCIUM 8.4* 8.2* 8.2*  MG  --  1.7 1.7  PHOS  --  3.3  --    GFR: Estimated Creatinine Clearance: 43 mL/min (A) (by C-G formula based on SCr of 1.07 mg/dL (H)). Liver Function Tests: Recent Labs  Lab 12/31/22 0514  AST 19  ALT 13  ALKPHOS 69  BILITOT 0.8  PROT 6.1*  ALBUMIN 2.6*   No results for input(s): "LIPASE", "AMYLASE" in the last 168 hours. No results for input(s): "AMMONIA" in the last 168 hours. Coagulation Profile: No results for input(s): "INR", "PROTIME" in the last 168 hours. Cardiac Enzymes: No results for input(s): "CKTOTAL", "CKMB", "CKMBINDEX", "TROPONINI" in the last 168 hours. BNP (last 3 results) No results for input(s): "PROBNP" in the last 8760 hours. HbA1C: No results for input(s): "HGBA1C" in the last 72 hours. CBG: Recent Labs  Lab 12/31/22 0729 12/31/22 1121 12/31/22 1702 12/31/22 2141 01/01/23 0720  GLUCAP 117* 242* 220* 293* 139*   Lipid Profile: No results for input(s): "CHOL", "HDL", "LDLCALC", "TRIG", "CHOLHDL", "LDLDIRECT" in the last 72  hours. Thyroid Function Tests: No results for input(s): "TSH", "T4TOTAL", "FREET4", "T3FREE", "THYROIDAB" in the last 72 hours. Anemia Panel: No results for input(s): "VITAMINB12", "FOLATE", "FERRITIN", "TIBC", "IRON", "RETICCTPCT" in the last 72 hours. Sepsis Labs: Recent Labs  Lab 12/30/22 2222  LATICACIDVEN 0.9    Recent Results (from the past 240 hour(s))  MRSA Next Gen by PCR, Nasal     Status: Abnormal   Collection Time: 12/31/22 12:18 AM   Specimen: Nasal Mucosa; Nasal Swab  Result Value Ref Range Status   MRSA by PCR Next Gen DETECTED (A) NOT DETECTED Final    Comment: RESULT CALLED TO, READ BACK BY AND VERIFIED WITH: BROWN @ 0622 ON [id number] BY HENDERSON L (NOTE) The GeneXpert MRSA Assay (FDA approved for NASAL specimens only), is one component of a comprehensive MRSA colonization surveillance program. It is not intended to diagnose MRSA infection nor to guide or monitor treatment for MRSA infections. Test performance is not FDA approved in patients less than 76 years old. Performed at [facility], [address] [place], [place] [address]  Radiology Studies: US Venous Img Lower Bilateral (DVT)  Result Date: 12/31/2022 CLINICAL DATA:  [id number] Leg swelling [id number] EXAM: BILATERAL LOWER EXTREMITY VENOUS DOPPLER ULTRASOUND TECHNIQUE: Gray-scale sonography with graded compression, as well as color Doppler and duplex ultrasound were performed to evaluate the lower extremity deep venous systems from the level of the common femoral vein and including the common femoral, femoral, profunda femoral, popliteal and calf veins including the posterior tibial, peroneal and gastrocnemius veins when visible. The superficial great saphenous vein was also interrogated. Spectral Doppler was utilized to evaluate flow at rest and with distal augmentation maneuvers in the common femoral, femoral and popliteal veins. COMPARISON:  Foot XRs 12/30/2018. FINDINGS: RIGHT LOWER EXTREMITY VENOUS  Normal compressibility of the RIGHT common femoral, superficial femoral, and popliteal veins, as well as the visualized calf veins. Visualized portions of profunda femoral vein and great saphenous vein unremarkable. No filling defects to suggest DVT on grayscale or color Doppler imaging. Doppler waveforms show normal direction of venous flow, normal respiratory plasticity and response to augmentation. OTHER No evidence of superficial thrombophlebitis or abnormal fluid collection. Limitations: none LEFT LOWER EXTREMITY VENOUS Normal compressibility of the LEFT common femoral, superficial femoral, and popliteal veins, as well as the visualized calf veins. Visualized portions of profunda femoral vein and great saphenous vein unremarkable. No filling defects to suggest DVT on grayscale or color Doppler imaging. Doppler waveforms show normal direction of venous flow, normal respiratory plasticity and response to augmentation. OTHER No evidence of superficial thrombophlebitis or abnormal fluid collection. Limitations: none IMPRESSION: No evidence of femoropopliteal DVT or superficial thrombophlebitis within either lower extremity. Roanna Banning, MD [facility] Radiology Electronically Signed   By: Roanna Banning M.D.   On: 12/31/2022 11:11   MR FOOT RIGHT W WO CONTRAST  Result Date: 12/31/2022 CLINICAL DATA:  Osteomyelitis, foot. Foot pain with difficulty ambulating for 1 week. Heel wound. EXAM: MRI OF THE RIGHT FOOT WITHOUT AND WITH CONTRAST TECHNIQUE: Multiplanar, multisequence MR imaging of the right hindfoot was performed before and after the administration of intravenous contrast. CONTRAST:  9mL GADAVIST GADOBUTROL 1 MMOL/ML IV SOLN COMPARISON:  Radiographs 12/30/2022 FINDINGS: Despite efforts by the technologist and patient, moderate motion artifact is present on today's exam and could not be eliminated. This reduces exam sensitivity and specificity.  Bones/Joint/Cartilage There is no evidence of acute fracture or dislocation. No cortical destruction, suspicious marrow signal or abnormal enhancement to suggest osteomyelitis. There are moderate midfoot degenerative changes with osteophytes and subchondral edema, greatest at the 2nd and 3rd tarsometatarsal joints. No significant joint effusions. Ligaments The major ankle ligaments appear intact. The Lisfranc ligament is intact. Muscles and Tendons Ankle tendons appear intact without significant tenosynovitis. No focal muscular abnormalities are identified. There is mild thickening of the central cord of the plantar facet adjacent to a plantar calcaneal spur. No associated T2 hyperintensity or abnormal enhancement to suggest active plantar fasciitis. Soft tissues No obvious soft tissue wound is demonstrated by this motion degraded examination. There is mild generalized subcutaneous edema. There are probable small incidental ganglia projecting dorsally from the Lisfranc joint, best seen on the coronal and sagittal images. IMPRESSION: 1. Moderately motion degraded examination. 2. No evidence of osteomyelitis or septic arthritis. 3. Moderate midfoot degenerative changes, greatest at the 2nd and 3rd tarsometatarsal joints. 4. Probable small incidental ganglia projecting dorsally from the Lisfranc joint. Electronically Signed   By: Carey Bullocks M.D.   On: 12/31/2022 09:05   DG Foot Complete Right  Result  Date: 12/30/2022 CLINICAL DATA:  Open wound in the skin EXAM: RIGHT FOOT COMPLETE - 3+ VIEW COMPARISON:  None Available. FINDINGS: No displaced fracture or dislocation is seen. Osteopenia is seen in the bony structures. Degenerative changes are noted in first metatarsophalangeal joint and dorsal aspect of the intertarsal and tarsometatarsal joints. There is a large plantar spur in calcaneus. Calcification is seen at the attachment of Achilles tendon to the calcaneus. IMPRESSION: No recent fracture or dislocation is  seen. There are scattered foci of lucencies in the bony structures, possibly due to osteopenia. There is no break in the cortical margins. If there is clinical suspicion for osteomyelitis, follow-up MRI may be considered. Other findings as described in the body of the report. Electronically Signed   By: Ernie Avena M.D.   On: 12/30/2022 18:40        Scheduled Meds:  amLODipine  5 mg Oral q morning   apixaban  5 mg Oral BID   atorvastatin  40 mg Oral Daily   colchicine  0.6 mg Oral BID   donepezil  5 mg Oral Daily   ezetimibe  10 mg Oral Daily   insulin aspart  0-15 Units Subcutaneous TID WC   insulin aspart  0-5 Units Subcutaneous QHS   insulin glargine-yfgn  10 Units Subcutaneous QHS   irbesartan  150 mg Oral Daily   levothyroxine  150 mcg Oral Q0600   mupirocin ointment   Nasal BID   nystatin   Topical BID   pantoprazole  40 mg Oral Daily   sertraline  50 mg Oral Daily   Continuous Infusions:  vancomycin 750 mg (01/01/23 0648)     LOS: 2 days    Time spent: 35 minutes    Joyce Leckey Hoover Brunette, DO [facility]  If 7PM-7AM, please contact night-coverage [email or web address] 01/01/2023, 10:45 AM

## 2023-01-02 DIAGNOSIS — S91301A Unspecified open wound, right foot, initial encounter: Secondary | ICD-10-CM | POA: Diagnosis not present

## 2023-01-02 LAB — CBC
HCT: 30.8 % — ABNORMAL LOW (ref 36.0–46.0)
Hemoglobin: 9.7 g/dL — ABNORMAL LOW (ref 12.0–15.0)
MCH: 28.5 pg (ref 26.0–34.0)
MCHC: 31.5 g/dL (ref 30.0–36.0)
MCV: 90.6 fL (ref 80.0–100.0)
Platelets: 222 10*3/uL (ref 150–400)
RBC: 3.4 MIL/uL — ABNORMAL LOW (ref 3.87–5.11)
RDW: 15.4 % (ref 11.5–15.5)
WBC: 7.9 10*3/uL (ref 4.0–10.5)
nRBC: 0 % (ref 0.0–0.2)

## 2023-01-02 LAB — BASIC METABOLIC PANEL
Anion gap: 8 (ref 5–15)
BUN: 21 mg/dL (ref 8–23)
CO2: 24 mmol/L (ref 22–32)
Calcium: 8.5 mg/dL — ABNORMAL LOW (ref 8.9–10.3)
Chloride: 103 mmol/L (ref 98–111)
Creatinine, Ser: 0.96 mg/dL (ref 0.44–1.00)
GFR, Estimated: 59 mL/min — ABNORMAL LOW (ref >60)
Glucose, Bld: 123 mg/dL — ABNORMAL HIGH (ref 70–99)
Potassium: 4.3 mmol/L (ref 3.5–5.1)
Sodium: 135 mmol/L (ref 135–145)

## 2023-01-02 LAB — MAGNESIUM: Magnesium: 1.9 mg/dL (ref 1.7–2.4)

## 2023-01-02 LAB — GLUCOSE, CAPILLARY
Glucose-Capillary: 127 mg/dL — ABNORMAL HIGH (ref 70–99)
Glucose-Capillary: 127 mg/dL — ABNORMAL HIGH (ref 70–99)
Glucose-Capillary: 138 mg/dL — ABNORMAL HIGH (ref 70–99)
Glucose-Capillary: 177 mg/dL — ABNORMAL HIGH (ref 70–99)

## 2023-01-02 NOTE — TOC Progression Note (Addendum)
Transition of Care [facility]) - Progression Note    Patient Details  Name: Tina Hester MRN: [id number] Date of Birth: April 27, 1941  Transition of Care [facility]) CM/SW Contact  Catalina Gravel, [place] Phone Number: 01/02/2023, 1:54 PM  Clinical Narrative:    [facility] contacted CSW today and advised that pt is authorized for SNF for 7 days.  There are two bed offers in Hub to present to family [facility] and [facility]. [facility] would like to be updated on bed selection.   Addendum: Daughter was visiting, CSW updated on bed offers. [facility] too far.  Accepted [facility]. CSW advised physician, and sent acceptance to facility via Hub. CSW called [facility] to advise of selection, VM. Left brief message that calling from [facility] apout an Auth today and the name of selected facility. No pt info left on VM.  Expected Discharge Plan: Skilled Nursing Facility Barriers to Discharge: Continued Medical Work up  Expected Discharge Plan and Services       Living arrangements for the past 2 months: Assisted Living Facility                                       Social Determinants of Health (SDOH) Interventions SDOH Screenings   Food Insecurity: No Food Insecurity (12/30/2022)  Housing: Low Risk  (12/30/2022)  Transportation Needs: No Transportation Needs (12/30/2022)  Utilities: Not At Risk (12/30/2022)  Depression (PHQ2-9): Low Risk  (06/23/2020)  Tobacco Use: Low Risk  (12/30/2022)    Readmission Risk Interventions     No data to display

## 2023-01-02 NOTE — Progress Notes (Signed)
Patient triggered yellow mews. Notified Dr. Sherryll Burger, and Sharol Roussel, charge RN. No new orders.     01/02/23 0805  Assess: MEWS Score  Temp 98.6 F (37 C)  BP (!) 152/92  MAP (mmHg) 110  Pulse Rate (!) 111  Resp 18  SpO2 96 %  O2 Device Room Air  Assess: MEWS Score  MEWS Temp 0  MEWS Systolic 0  MEWS Pulse 2  MEWS RR 0  MEWS LOC 0  MEWS Score 2  MEWS Score Color Yellow  Assess: if the MEWS score is Yellow or Red  Were vital signs taken at a resting state? Yes  Focused Assessment No change from prior assessment  Does the patient meet 2 or more of the SIRS criteria? No  MEWS guidelines implemented  Yes, yellow  Treat  MEWS Interventions Considered administering scheduled or prn medications/treatments as ordered  Take Vital Signs  Increase Vital Sign Frequency  Yellow: Q2hr x1, continue Q4hrs until patient remains green for 12hrs  Escalate  MEWS: Escalate Yellow: Discuss with charge nurse and consider notifying provider and/or RRT  Notify: Charge Nurse/RN  Name of Charge Nurse/RN Notified Dory Larsen, RN  Provider Notification  Provider Name/Title Dr. Sherryll Burger  Date Provider Notified 01/02/23  Time Provider Notified 0831  Method of Notification Page  Notification Reason Other (Comment) (yellow mews)  Provider response No new orders  Date of Provider Response 01/02/23  Time of Provider Response 0815  Assess: SIRS CRITERIA  SIRS Temperature  0  SIRS Pulse 1  SIRS Respirations  0  SIRS WBC 0  SIRS Score Sum  1

## 2023-01-02 NOTE — Progress Notes (Signed)
PROGRESS NOTE    TARYN NAEVE  ZOX:[id number] DOB: 11/22/1940 DOA: 12/30/2022 PCP: [name], MD   Brief Narrative:  Tina Hester is a 82 y.o. female with medical history significant of hypertension, hyperlipidemia, atrial fibrillation, hypothyroidism, GERD, type 2 diabetes who presents to the emergency department [facility] [place] due to 1 week onset of left foot pain with difficulty in ambulation and now ambulates with a wheelchair (normally ambulates with a walker at baseline).  Patient was admitted for evaluation of open wound to her right heel as well as left lower extremity edema.  Studies are negative for DVT and there is no sign of osteomyelitis.  She was started on IV vancomycin for treatment of cellulitis.  She is now complaining of severe pain that appears to be gout related.  Assessment & Plan:   Principal Problem:   Open wound of right heel Active Problems:   Mixed hyperlipidemia   Essential hypertension   Persistent atrial fibrillation (HCC)   Chronic kidney disease, stage 3b (HCC)   Acquired hypothyroidism   Left leg swelling   Leukocytosis   Hyponatremia   Type 2 diabetes mellitus with hyperglycemia (HCC)   GERD (gastroesophageal reflux disease)   Dementia without behavioral disturbance (HCC)  Assessment and Plan:  Open wound of right heel, rule out osteomyelitis IV ceftriaxone was empirically given, continue IV vancomycin MRI with and without contrast of right foot in the morning Appreciate wound consultation   Left leg swelling with acute gout flare DVT ultrasound negative PT reccommending SNF Possible acute gout flare and started on colchicine and uric acid noted to be elevated   Leukocytosis possibly reactive vs infectious-resolving Continue monitor CBC and maintain on vancomycin No sign of osteomyelitis noted on MRI   Type 2 diabetes mellitus with hyperglycemia Continue Semglee 10 units nightly and adjust dose  accordingly Continue ISS and hypoglycemia protocol   Chronic kidney disease stage IIIb BUN/creatinine 39/1.44 (creatinine is within baseline range) Renally adjust medications, avoid nephrotoxic agents/dehydration/hypotension   Essential hypertension Continue amlodipine and Avapro   Mixed hyperlipidemia Continue Lipitor and Zetia   Persistent atrial fibrillation Continue Eliquis Metoprolol temporarily held due to patient being currently bradycardic   GERD Continue Protonix   Acquired hypothyroidism Continue Synthroid   Depression Continue Zoloft   Dementia Continue Aricept  Obesity BMI 38.86    DVT prophylaxis:Eliquis Code Status: Full Family Communication: Daughter at bedside 5/3 Disposition Plan:  Status is: Inpatient Remains inpatient appropriate because: Need for IV medications.   Consultants:  None  Procedures:  None  Antimicrobials:  Anti-infectives (From admission, onward)    Start     Dose/Rate Route Frequency Ordered Stop   12/31/22 0745  vancomycin (VANCOREADY) IVPB 750 mg/150 mL        750 mg 150 mL/hr over 60 Minutes Intravenous Every 24 hours 12/31/22 0646     12/30/22 1815  cefTRIAXone (ROCEPHIN) 1 g in sodium chloride 0.9 % 100 mL IVPB        1 g 200 mL/hr over 30 Minutes Intravenous  Once 12/30/22 1807 12/30/22 2018       Subjective: Patient seen and evaluated today with ongoing pain to bilateral lower extremities that she claims is improving.  Objective: Vitals:   01/01/23 1551 01/01/23 2049 01/02/23 0556 01/02/23 0805  BP: (!) 140/55 (!) 155/80 (!) 158/66 (!) 152/92  Pulse: 74 (!) 108 85 (!) 111  Resp: 16 20 20 18   Temp: 98.4 F (36.9 C) 98.5  F (36.9 C) 98.4 F (36.9 C) 98.6 F (37 C)  TempSrc: Oral Oral Oral Oral  SpO2: 95% 97% 98% 96%  Weight:      Height:        Intake/Output Summary (Last 24 hours) at 01/02/2023 1029 Last data filed at 01/02/2023 0500 Gross per 24 hour  Intake --  Output 800 ml  Net -800 ml    Filed Weights   12/30/22 1724 12/30/22 2340  Weight: 95 kg 93.3 kg    Examination:  General exam: Appears calm and comfortable  Respiratory system: Clear to auscultation. Respiratory effort normal. Cardiovascular system: S1 & S2 heard, RRR.  Gastrointestinal system: Abdomen is soft Central nervous system: Alert and awake Extremities: Bilateral heels and dressings C/D/I Skin: No significant lesions noted Psychiatry: Flat affect.    Data Reviewed: I have personally reviewed following labs and imaging studies  CBC: Recent Labs  Lab 12/30/22 1816 12/31/22 0514 01/01/23 0451 01/02/23 0504  WBC 15.7* 10.7* 8.3 7.9  HGB 10.7* 9.3* 9.0* 9.7*  HCT 34.6* 29.3* 28.5* 30.8*  MCV 94.3 91.8 91.9 90.6  PLT 190 170 182 222   Basic Metabolic Panel: Recent Labs  Lab 12/30/22 1816 12/31/22 0514 01/01/23 0451 01/02/23 0504  NA 129* 134* 135 135  K 3.7 3.4* 3.9 4.3  CL 94* 102 104 103  CO2 23 24 24 24   GLUCOSE 322* 126* 132* 123*  BUN 39* 36* 29* 21  CREATININE 1.44* 1.07* 1.07* 0.96  CALCIUM 8.4* 8.2* 8.2* 8.5*  MG  --  1.7 1.7 1.9  PHOS  --  3.3  --   --    GFR: Estimated Creatinine Clearance: 47.9 mL/min (by C-G formula based on SCr of 0.96 mg/dL). Liver Function Tests: Recent Labs  Lab 12/31/22 0514  AST 19  ALT 13  ALKPHOS 69  BILITOT 0.8  PROT 6.1*  ALBUMIN 2.6*   No results for input(s): "LIPASE", "AMYLASE" in the last 168 hours. No results for input(s): "AMMONIA" in the last 168 hours. Coagulation Profile: No results for input(s): "INR", "PROTIME" in the last 168 hours. Cardiac Enzymes: No results for input(s): "CKTOTAL", "CKMB", "CKMBINDEX", "TROPONINI" in the last 168 hours. BNP (last 3 results) No results for input(s): "PROBNP" in the last 8760 hours. HbA1C: No results for input(s): "HGBA1C" in the last 72 hours. CBG: Recent Labs  Lab 01/01/23 0720 01/01/23 1131 01/01/23 1650 01/01/23 2131 01/02/23 0817  GLUCAP 139* 125* 174* 114* 127*    Lipid Profile: No results for input(s): "CHOL", "HDL", "LDLCALC", "TRIG", "CHOLHDL", "LDLDIRECT" in the last 72 hours. Thyroid Function Tests: No results for input(s): "TSH", "T4TOTAL", "FREET4", "T3FREE", "THYROIDAB" in the last 72 hours. Anemia Panel: No results for input(s): "VITAMINB12", "FOLATE", "FERRITIN", "TIBC", "IRON", "RETICCTPCT" in the last 72 hours. Sepsis Labs: Recent Labs  Lab 12/30/22 2222  LATICACIDVEN 0.9    Recent Results (from the past 240 hour(s))  MRSA Next Gen by PCR, Nasal     Status: Abnormal   Collection Time: 12/31/22 12:18 AM   Specimen: Nasal Mucosa; Nasal Swab  Result Value Ref Range Status   MRSA by PCR Next Gen DETECTED (A) NOT DETECTED Final    Comment: RESULT CALLED TO, READ BACK BY AND VERIFIED WITH: BROWN @ 0622 ON [id number] BY HENDERSON L (NOTE) The GeneXpert MRSA Assay (FDA approved for NASAL specimens only), is one component of a comprehensive MRSA colonization surveillance program. It is not intended to diagnose MRSA infection nor to guide or monitor treatment for MRSA  infections. Test performance is not FDA approved in patients less than 51 years old. Performed at [facility], [address] [place], [place] [address]          Radiology Studies: US Venous Img Lower Bilateral (DVT)  Result Date: 12/31/2022 CLINICAL DATA:  [id number] Leg swelling [id number] EXAM: BILATERAL LOWER EXTREMITY VENOUS DOPPLER ULTRASOUND TECHNIQUE: Gray-scale sonography with graded compression, as well as color Doppler and duplex ultrasound were performed to evaluate the lower extremity deep venous systems from the level of the common femoral vein and including the common femoral, femoral, profunda femoral, popliteal and calf veins including the posterior tibial, peroneal and gastrocnemius veins when visible. The superficial great saphenous vein was also interrogated. Spectral Doppler was utilized to evaluate flow at rest and with distal augmentation maneuvers in the  common femoral, femoral and popliteal veins. COMPARISON:  Foot XRs 12/30/2018. FINDINGS: RIGHT LOWER EXTREMITY VENOUS Normal compressibility of the RIGHT common femoral, superficial femoral, and popliteal veins, as well as the visualized calf veins. Visualized portions of profunda femoral vein and great saphenous vein unremarkable. No filling defects to suggest DVT on grayscale or color Doppler imaging. Doppler waveforms show normal direction of venous flow, normal respiratory plasticity and response to augmentation. OTHER No evidence of superficial thrombophlebitis or abnormal fluid collection. Limitations: none LEFT LOWER EXTREMITY VENOUS Normal compressibility of the LEFT common femoral, superficial femoral, and popliteal veins, as well as the visualized calf veins. Visualized portions of profunda femoral vein and great saphenous vein unremarkable. No filling defects to suggest DVT on grayscale or color Doppler imaging. Doppler waveforms show normal direction of venous flow, normal respiratory plasticity and response to augmentation. OTHER No evidence of superficial thrombophlebitis or abnormal fluid collection. Limitations: none IMPRESSION: No evidence of femoropopliteal DVT or superficial thrombophlebitis within either lower extremity. Roanna Banning, MD [facility] Radiology Electronically Signed   By: Roanna Banning M.D.   On: 12/31/2022 11:11        Scheduled Meds:  amLODipine  5 mg Oral q morning   apixaban  5 mg Oral BID   atorvastatin  40 mg Oral Daily   colchicine  0.6 mg Oral BID   donepezil  5 mg Oral Daily   ezetimibe  10 mg Oral Daily   insulin aspart  0-15 Units Subcutaneous TID WC   insulin aspart  0-5 Units Subcutaneous QHS   insulin glargine-yfgn  10 Units Subcutaneous QHS   irbesartan  150 mg Oral Daily   levothyroxine  150 mcg Oral Q0600   mupirocin ointment   Nasal BID   nystatin   Topical BID   pantoprazole  40 mg Oral Daily    sertraline  50 mg Oral Daily   Continuous Infusions:  vancomycin 750 mg (01/02/23 0752)     LOS: 3 days    Time spent: 35 minutes    Jahrel Borthwick Hoover Brunette, DO [facility]  If 7PM-7AM, please contact night-coverage [email or web address] 01/02/2023, 10:29 AM

## 2023-01-03 DIAGNOSIS — S91309A Unspecified open wound, unspecified foot, initial encounter: Secondary | ICD-10-CM | POA: Diagnosis not present

## 2023-01-03 DIAGNOSIS — R2681 Unsteadiness on feet: Secondary | ICD-10-CM | POA: Diagnosis not present

## 2023-01-03 DIAGNOSIS — M6281 Muscle weakness (generalized): Secondary | ICD-10-CM | POA: Diagnosis not present

## 2023-01-03 DIAGNOSIS — R41841 Cognitive communication deficit: Secondary | ICD-10-CM | POA: Diagnosis not present

## 2023-01-03 DIAGNOSIS — I509 Heart failure, unspecified: Secondary | ICD-10-CM | POA: Diagnosis not present

## 2023-01-03 DIAGNOSIS — F329 Major depressive disorder, single episode, unspecified: Secondary | ICD-10-CM | POA: Diagnosis not present

## 2023-01-03 DIAGNOSIS — L03115 Cellulitis of right lower limb: Secondary | ICD-10-CM | POA: Diagnosis not present

## 2023-01-03 DIAGNOSIS — D72829 Elevated white blood cell count, unspecified: Secondary | ICD-10-CM | POA: Diagnosis not present

## 2023-01-03 DIAGNOSIS — E782 Mixed hyperlipidemia: Secondary | ICD-10-CM | POA: Diagnosis not present

## 2023-01-03 DIAGNOSIS — M109 Gout, unspecified: Secondary | ICD-10-CM | POA: Diagnosis not present

## 2023-01-03 DIAGNOSIS — S91301A Unspecified open wound, right foot, initial encounter: Secondary | ICD-10-CM | POA: Diagnosis not present

## 2023-01-03 DIAGNOSIS — I4891 Unspecified atrial fibrillation: Secondary | ICD-10-CM | POA: Diagnosis not present

## 2023-01-03 DIAGNOSIS — E1165 Type 2 diabetes mellitus with hyperglycemia: Secondary | ICD-10-CM | POA: Diagnosis not present

## 2023-01-03 DIAGNOSIS — E569 Vitamin deficiency, unspecified: Secondary | ICD-10-CM | POA: Diagnosis not present

## 2023-01-03 DIAGNOSIS — I10 Essential (primary) hypertension: Secondary | ICD-10-CM | POA: Diagnosis not present

## 2023-01-03 DIAGNOSIS — E785 Hyperlipidemia, unspecified: Secondary | ICD-10-CM | POA: Diagnosis not present

## 2023-01-03 DIAGNOSIS — S91301D Unspecified open wound, right foot, subsequent encounter: Secondary | ICD-10-CM | POA: Diagnosis not present

## 2023-01-03 DIAGNOSIS — F0280 Dementia in other diseases classified elsewhere without behavioral disturbance: Secondary | ICD-10-CM | POA: Diagnosis not present

## 2023-01-03 DIAGNOSIS — F419 Anxiety disorder, unspecified: Secondary | ICD-10-CM | POA: Diagnosis not present

## 2023-01-03 DIAGNOSIS — H40002 Preglaucoma, unspecified, left eye: Secondary | ICD-10-CM | POA: Diagnosis not present

## 2023-01-03 DIAGNOSIS — I4819 Other persistent atrial fibrillation: Secondary | ICD-10-CM | POA: Diagnosis not present

## 2023-01-03 DIAGNOSIS — K219 Gastro-esophageal reflux disease without esophagitis: Secondary | ICD-10-CM | POA: Diagnosis not present

## 2023-01-03 DIAGNOSIS — G629 Polyneuropathy, unspecified: Secondary | ICD-10-CM | POA: Diagnosis not present

## 2023-01-03 LAB — CBC
HCT: 32.3 % — ABNORMAL LOW (ref 36.0–46.0)
Hemoglobin: 10.3 g/dL — ABNORMAL LOW (ref 12.0–15.0)
MCH: 28.5 pg (ref 26.0–34.0)
MCHC: 31.9 g/dL (ref 30.0–36.0)
MCV: 89.5 fL (ref 80.0–100.0)
Platelets: 224 10*3/uL (ref 150–400)
RBC: 3.61 MIL/uL — ABNORMAL LOW (ref 3.87–5.11)
RDW: 15.3 % (ref 11.5–15.5)
WBC: 6 10*3/uL (ref 4.0–10.5)
nRBC: 0 % (ref 0.0–0.2)

## 2023-01-03 LAB — BASIC METABOLIC PANEL WITH GFR
Anion gap: 8 (ref 5–15)
BUN: 17 mg/dL (ref 8–23)
CO2: 23 mmol/L (ref 22–32)
Calcium: 8.5 mg/dL — ABNORMAL LOW (ref 8.9–10.3)
Chloride: 103 mmol/L (ref 98–111)
Creatinine, Ser: 0.91 mg/dL (ref 0.44–1.00)
GFR, Estimated: >60 mL/min
Glucose, Bld: 157 mg/dL — ABNORMAL HIGH (ref 70–99)
Potassium: 3.8 mmol/L (ref 3.5–5.1)
Sodium: 134 mmol/L — ABNORMAL LOW (ref 135–145)

## 2023-01-03 LAB — MAGNESIUM: Magnesium: 1.7 mg/dL (ref 1.7–2.4)

## 2023-01-03 LAB — GLUCOSE, CAPILLARY
Glucose-Capillary: 173 mg/dL — ABNORMAL HIGH (ref 70–99)
Glucose-Capillary: 158 mg/dL — ABNORMAL HIGH (ref 70–99)

## 2023-01-03 MED ORDER — HYDROCODONE-ACETAMINOPHEN 5-325 MG PO TABS: ORAL_TABLET | ORAL | 0 refills | Status: DC

## 2023-01-03 MED ORDER — ALPRAZOLAM 0.25 MG PO TABS: 0.2500 mg | ORAL_TABLET | Freq: Every evening | ORAL | 0 refills | Status: DC | PRN

## 2023-01-03 MED ORDER — DOXYCYCLINE HYCLATE 100 MG PO TABS: 100.0000 mg | ORAL_TABLET | Freq: Two times a day (BID) | ORAL | 0 refills | Status: AC

## 2023-01-03 NOTE — Discharge Summary (Signed)
Physician Discharge Summary  Tina Hester:[id number] DOB: 1941-02-01 DOA: 12/30/2022  PCP: [name], MD  Admit date: 12/30/2022  Discharge date: 01/03/2023  Admitted From:Home  Disposition:  SNF  Recommendations for Outpatient Follow-up:  Follow up with PCP in 1-2 weeks Continue on colchicine as needed for gout flare Hydrocodone prescribed as needed for severe pain symptoms Continue doxycycline as prescribed for 10 more days to complete total course of treatment Continue other home medications as noted below  Home Health: None  Equipment/Devices: None  Discharge Condition:Stable  CODE STATUS: Full  Diet recommendation: Heart Healthy/carb modified  Brief/Interim Summary:  Tina Hester is a 82 y.o. female with medical history significant of hypertension, hyperlipidemia, atrial fibrillation, hypothyroidism, GERD, type 2 diabetes who presents to the emergency department [facility] [place] due to 1 week onset of left foot pain with difficulty in ambulation and now ambulates with a wheelchair (normally ambulates with a walker at baseline).  Patient was admitted for evaluation of open wound to her right heel as well as left lower extremity edema.  Studies are negative for DVT and there is no sign of osteomyelitis.  She was started on IV vancomycin for treatment of cellulitis.  She is now complaining of severe pain that appears to be gout related.  Her pain symptoms have improved and she is now in stable condition for discharge on oral doxycycline for 10 more days to complete total 14-day course of treatment.  She was seen by PT with recommendations for SNF/rehab with no other acute complaints or concerns noted during this admission.  Discharge Diagnoses:  Principal Problem:   Open wound of right heel Active Problems:   Mixed hyperlipidemia   Essential hypertension   Persistent atrial fibrillation (HCC)   Chronic kidney disease, stage 3b (HCC)    Acquired hypothyroidism   Left leg swelling   Leukocytosis   Hyponatremia   Type 2 diabetes mellitus with hyperglycemia (HCC)   GERD (gastroesophageal reflux disease)   Dementia without behavioral disturbance (HCC)  Principal discharge diagnosis: Right heel open wound infection/cellulitis with acute gout flare.  Discharge Instructions  Discharge Instructions     Diet - low sodium heart healthy   Complete by: As directed    Discharge wound care:   Complete by: As directed    Wound care to Unstageable pressure injury to right heel:  Cleanse with soap and water, rinse and dry. Paint with povidone-iodine (betadine) swabstick and allow to air-dry. When dry, cover with dry gauze and secure with a few turns of Kerlix roll gauze/paper tape. Change daily. Place foot into [employer].   Increase activity slowly   Complete by: As directed       Allergies as of 01/03/2023       Reactions   Pneumococcal Vaccine Anaphylaxis   weakness   Pneumovax 23 [pneumococcal Vac Polyvalent] Anaphylaxis   weakness   Other Hives   Other reaction(s): Other (See Comments) Pneumonia  Vaccine- very ill   Sulfa Antibiotics Hives   Pregabalin    Other reaction(s): depression        Medication List     STOP taking these medications    hydrochlorothiazide 25 MG tablet Commonly known as: HYDRODIURIL       TAKE these medications    acetaminophen 325 MG tablet Commonly known as: TYLENOL Take 500 mg by mouth every 4 (four) hours as needed for mild pain or moderate pain.   ALPRAZolam 0.25 MG tablet Commonly  known as: XANAX Take 1 tablet (0.25 mg total) by mouth at bedtime as needed for anxiety.   amLODipine 5 MG tablet Commonly known as: NORVASC TAKE ONE TABLET BY MOUTH EVERY MORNING   atorvastatin 40 MG tablet Commonly known as: LIPITOR TAKE ONE TABLET BY MOUTH EVERY EVENING   BD Disp Needles 30G X 1/2" Misc Generic drug: NEEDLE (DISP) 30 G 1 each by Does not apply route 2 (two) times  daily before a meal.   CALCIUM PO Take 1 tablet by mouth daily. 600 mg   Cholecalciferol 50 MCG (2000 UT) Tabs Take 400 Units by mouth daily.   clotrimazole 1 % cream Commonly known as: LOTRIMIN Apply 1 Application topically 2 (two) times daily.   Colcrys 0.6 MG tablet Generic drug: colchicine Take 0.6 mg by mouth daily as needed (gout).   cyanocobalamin 1000 MCG tablet Commonly known as: VITAMIN B12 Take 1,000 mcg by mouth daily.   donepezil 5 MG tablet Commonly known as: ARICEPT Take 5 mg by mouth daily.   doxycycline 100 MG tablet Commonly known as: VIBRA-TABS Take 1 tablet (100 mg total) by mouth 2 (two) times daily for 10 days.   Eliquis 5 MG Tabs tablet Generic drug: apixaban TAKE ONE TABLET BY MOUTH TWICE DAILY   erythromycin ophthalmic ointment 1 application 4 (four) times daily.   ezetimibe 10 MG tablet Commonly known as: ZETIA Take 10 mg by mouth daily.   FreeStyle Freedom Lite w/Device Kit 1 each by Does not apply route in the morning and at bedtime. Use Freestyle lite meter to check blood sugar twice daily.   FREESTYLE LITE test strip Generic drug: glucose blood Use to test blood sugar twice daily. Dx: E11.9   gabapentin 100 MG capsule Commonly known as: NEURONTIN Take 600 mg by mouth 3 (three) times daily.   glipiZIDE 10 MG tablet Commonly known as: GLUCOTROL Take 10 mg by mouth daily before breakfast.   HYDROcodone-acetaminophen 5-325 MG tablet Commonly known as: NORCO/VICODIN hydrocodone 5 mg-acetaminophen 325 mg tablet  Take 1 tablet 3 times a day by oral route as needed.   insulin lispro 100 UNIT/ML injection Commonly known as: HUMALOG Inject 1-7 Units into the skin 3 (three) times daily before meals. Sliding scale 141-180=1 unit,181-220= 2 units,221-260= 3 units,261-300=4 units, 301-340=5 units, 341-380=6 units, 381-400 = 7 units,> 400 call MD   insulin regular 100 units/mL injection Commonly known as: NOVOLIN R Inject into the skin 3  (three) times daily before meals. 8 -10 units   levothyroxine 150 MCG tablet Commonly known as: SYNTHROID TAKE ONE TABLET BY MOUTH BEFORE BREAKFAST   magnesium 30 MG tablet Take 30 mg by mouth at bedtime.   metFORMIN 500 MG tablet Commonly known as: GLUCOPHAGE TAKE ONE TABLET BY MOUTH EVERY MORNING and TAKE TWO TABLETS BY MOUTH EVERY EVENING   metoprolol tartrate 50 MG tablet Commonly known as: LOPRESSOR Take 1 tablet (50 mg total) by mouth 2 (two) times daily.   Multaq 400 MG tablet Generic drug: dronedarone   NovoLIN N FlexPen 100 UNIT/ML Kiwkpen Generic drug: Insulin NPH (Human) (Isophane) Inject 15 units at morning and 18 units at night   olmesartan 20 MG tablet Commonly known as: BENICAR Take 20 mg by mouth daily.   omeprazole 20 MG capsule Commonly known as: PRILOSEC Take 20 mg by mouth daily.   OneTouch Delica Lancets 33G Misc Use to check blood sugars once daily   Ozempic (0.25 or 0.5 MG/DOSE) 2 MG/3ML Sopn Generic drug: Semaglutide(0.25 or  0.5MG /DOS) Inject 0.5mg  into THE SKIN WEEKLY   pioglitazone 15 MG tablet Commonly known as: ACTOS Take 1 tablet (15 mg total) by mouth every morning.   sertraline 50 MG tablet Commonly known as: ZOLOFT Take 50 mg by mouth daily.   Lantus SoloStar 100 UNIT/ML Solostar Pen Generic drug: insulin glargine Inject 15 Units into the skin daily.   Toujeo SoloStar 300 UNIT/ML Solostar Pen Generic drug: insulin glargine (1 Unit Dial) Inject 30 units into THE SKIN ONCE daily               Discharge Care Instructions  (From admission, onward)           Start     Ordered   01/03/23 0000  Discharge wound care:       Comments: Wound care to Unstageable pressure injury to right heel:  Cleanse with soap and water, rinse and dry. Paint with povidone-iodine (betadine) swabstick and allow to air-dry. When dry, cover with dry gauze and secure with a few turns of Kerlix roll gauze/paper tape. Change daily. Place foot into  [employer].   01/03/23 0920            Contact information for follow-up providers     [name], MD. Schedule an appointment as soon as possible for a visit in 1 week(s).   Specialty: Family Medicine Contact information: [address] [place] [address] [phone]              Contact information for after-discharge care     Destination     [facility] Preferred SNF .   Service: Skilled Nursing Contact information: [address] [place] [address] [phone]                    Allergies  Allergen Reactions   Pneumococcal Vaccine Anaphylaxis    weakness   Pneumovax 23 [Pneumococcal Vac Polyvalent] Anaphylaxis    weakness   Other Hives    Other reaction(s): Other (See Comments) Pneumonia  Vaccine- very ill   Sulfa Antibiotics Hives   Pregabalin     Other reaction(s): depression    Consultations: None   Procedures/Studies: US Venous Img Lower Bilateral (DVT)  Result Date: 12/31/2022 CLINICAL DATA:  [id number] Leg swelling [id number] EXAM: BILATERAL LOWER EXTREMITY VENOUS DOPPLER ULTRASOUND TECHNIQUE: Gray-scale sonography with graded compression, as well as color Doppler and duplex ultrasound were performed to evaluate the lower extremity deep venous systems from the level of the common femoral vein and including the common femoral, femoral, profunda femoral, popliteal and calf veins including the posterior tibial, peroneal and gastrocnemius veins when visible. The superficial great saphenous vein was also interrogated. Spectral Doppler was utilized to evaluate flow at rest and with distal augmentation maneuvers in the common femoral, femoral and popliteal veins. COMPARISON:  Foot XRs 12/30/2018. FINDINGS: RIGHT LOWER EXTREMITY VENOUS Normal compressibility of the RIGHT common femoral, superficial femoral, and popliteal veins, as well as the visualized calf veins.  Visualized portions of profunda femoral vein and great saphenous vein unremarkable. No filling defects to suggest DVT on grayscale or color Doppler imaging. Doppler waveforms show normal direction of venous flow, normal respiratory plasticity and response to augmentation. OTHER No evidence of superficial thrombophlebitis or abnormal fluid collection. Limitations: none LEFT LOWER EXTREMITY VENOUS Normal compressibility of the LEFT common femoral, superficial femoral, and popliteal veins, as well as the visualized calf veins. Visualized portions of profunda femoral vein and  great saphenous vein unremarkable. No filling defects to suggest DVT on grayscale or color Doppler imaging. Doppler waveforms show normal direction of venous flow, normal respiratory plasticity and response to augmentation. OTHER No evidence of superficial thrombophlebitis or abnormal fluid collection. Limitations: none IMPRESSION: No evidence of femoropopliteal DVT or superficial thrombophlebitis within either lower extremity. Roanna Banning, MD [facility] Radiology Electronically Signed   By: Roanna Banning M.D.   On: 12/31/2022 11:11   MR FOOT RIGHT W WO CONTRAST  Result Date: 12/31/2022 CLINICAL DATA:  Osteomyelitis, foot. Foot pain with difficulty ambulating for 1 week. Heel wound. EXAM: MRI OF THE RIGHT FOOT WITHOUT AND WITH CONTRAST TECHNIQUE: Multiplanar, multisequence MR imaging of the right hindfoot was performed before and after the administration of intravenous contrast. CONTRAST:  9mL GADAVIST GADOBUTROL 1 MMOL/ML IV SOLN COMPARISON:  Radiographs 12/30/2022 FINDINGS: Despite efforts by the technologist and patient, moderate motion artifact is present on today's exam and could not be eliminated. This reduces exam sensitivity and specificity. Bones/Joint/Cartilage There is no evidence of acute fracture or dislocation. No cortical destruction, suspicious marrow signal or abnormal enhancement  to suggest osteomyelitis. There are moderate midfoot degenerative changes with osteophytes and subchondral edema, greatest at the 2nd and 3rd tarsometatarsal joints. No significant joint effusions. Ligaments The major ankle ligaments appear intact. The Lisfranc ligament is intact. Muscles and Tendons Ankle tendons appear intact without significant tenosynovitis. No focal muscular abnormalities are identified. There is mild thickening of the central cord of the plantar facet adjacent to a plantar calcaneal spur. No associated T2 hyperintensity or abnormal enhancement to suggest active plantar fasciitis. Soft tissues No obvious soft tissue wound is demonstrated by this motion degraded examination. There is mild generalized subcutaneous edema. There are probable small incidental ganglia projecting dorsally from the Lisfranc joint, best seen on the coronal and sagittal images. IMPRESSION: 1. Moderately motion degraded examination. 2. No evidence of osteomyelitis or septic arthritis. 3. Moderate midfoot degenerative changes, greatest at the 2nd and 3rd tarsometatarsal joints. 4. Probable small incidental ganglia projecting dorsally from the Lisfranc joint. Electronically Signed   By: Carey Bullocks M.D.   On: 12/31/2022 09:05   DG Foot Complete Right  Result Date: 12/30/2022 CLINICAL DATA:  Open wound in the skin EXAM: RIGHT FOOT COMPLETE - 3+ VIEW COMPARISON:  None Available. FINDINGS: No displaced fracture or dislocation is seen. Osteopenia is seen in the bony structures. Degenerative changes are noted in first metatarsophalangeal joint and dorsal aspect of the intertarsal and tarsometatarsal joints. There is a large plantar spur in calcaneus. Calcification is seen at the attachment of Achilles tendon to the calcaneus. IMPRESSION: No recent fracture or dislocation is seen. There are scattered foci of lucencies in the bony structures, possibly due to osteopenia. There is no break in the cortical margins. If there is  clinical suspicion for osteomyelitis, follow-up MRI may be considered. Other findings as described in the body of the report. Electronically Signed   By: Ernie Avena M.D.   On: 12/30/2022 18:40     Discharge Exam: Vitals:   01/02/23 2038 01/03/23 0357  BP: (!) 155/71 (!) 159/83  Pulse: 85 96  Resp: 20 19  Temp: 97.9 F (36.6 C) 98.3 F (36.8 C)  SpO2: 97% 97%   Vitals:   01/02/23 1030 01/02/23 1514 01/02/23 2038 01/03/23 0357  BP: (!) 156/76 (!) 125/57 (!) 155/71 (!) 159/83  Pulse: 89 100 85 96  Resp: 20 18 20 19   Temp: 98.1 F (  36.7 C)  97.9 F (36.6 C) 98.3 F (36.8 C)  TempSrc: Oral  Oral Oral  SpO2: 98% 98% 97% 97%  Weight:      Height:        General: Pt is alert, awake, not in acute distress Cardiovascular: RRR, S1/S2 +, no rubs, no gallops Respiratory: CTA bilaterally, no wheezing, no rhonchi Abdominal: Soft, NT, ND, bowel sounds + Extremities: Feet in dressings, C/D/I    The results of significant diagnostics from this hospitalization (including imaging, microbiology, ancillary and laboratory) are listed below for reference.     Microbiology: Recent Results (from the past 240 hour(s))  MRSA Next Gen by PCR, Nasal     Status: Abnormal   Collection Time: 12/31/22 12:18 AM   Specimen: Nasal Mucosa; Nasal Swab  Result Value Ref Range Status   MRSA by PCR Next Gen DETECTED (A) NOT DETECTED Final    Comment: RESULT CALLED TO, READ BACK BY AND VERIFIED WITH: BROWN @ 0622 ON [id number] BY HENDERSON L (NOTE) The GeneXpert MRSA Assay (FDA approved for NASAL specimens only), is one component of a comprehensive MRSA colonization surveillance program. It is not intended to diagnose MRSA infection nor to guide or monitor treatment for MRSA infections. Test performance is not FDA approved in patients less than 33 years old. Performed at [facility], [address] [place], [place] [address]      Labs: BNP (last 3 results) No results for input(s): "BNP" in  the last 8760 hours. Basic Metabolic Panel: Recent Labs  Lab 12/30/22 1816 12/31/22 0514 01/01/23 0451 01/02/23 0504 01/03/23 0515  NA 129* 134* 135 135 134*  K 3.7 3.4* 3.9 4.3 3.8  CL 94* 102 104 103 103  CO2 23 24 24 24 23   GLUCOSE 322* 126* 132* 123* 157*  BUN 39* 36* 29* 21 17  CREATININE 1.44* 1.07* 1.07* 0.96 0.91  CALCIUM 8.4* 8.2* 8.2* 8.5* 8.5*  MG  --  1.7 1.7 1.9 1.7  PHOS  --  3.3  --   --   --    Liver Function Tests: Recent Labs  Lab 12/31/22 0514  AST 19  ALT 13  ALKPHOS 69  BILITOT 0.8  PROT 6.1*  ALBUMIN 2.6*   No results for input(s): "LIPASE", "AMYLASE" in the last 168 hours. No results for input(s): "AMMONIA" in the last 168 hours. CBC: Recent Labs  Lab 12/30/22 1816 12/31/22 0514 01/01/23 0451 01/02/23 0504 01/03/23 0515  WBC 15.7* 10.7* 8.3 7.9 6.0  HGB 10.7* 9.3* 9.0* 9.7* 10.3*  HCT 34.6* 29.3* 28.5* 30.8* 32.3*  MCV 94.3 91.8 91.9 90.6 89.5  PLT 190 170 182 222 224   Cardiac Enzymes: No results for input(s): "CKTOTAL", "CKMB", "CKMBINDEX", "TROPONINI" in the last 168 hours. BNP: Invalid input(s): "POCBNP" CBG: Recent Labs  Lab 01/02/23 0817 01/02/23 1233 01/02/23 1642 01/02/23 2124 01/03/23 0758  GLUCAP 127* 127* 138* 177* 173*   D-Dimer No results for input(s): "DDIMER" in the last 72 hours. Hgb A1c No results for input(s): "HGBA1C" in the last 72 hours. Lipid Profile No results for input(s): "CHOL", "HDL", "LDLCALC", "TRIG", "CHOLHDL", "LDLDIRECT" in the last 72 hours. Thyroid function studies No results for input(s): "TSH", "T4TOTAL", "T3FREE", "THYROIDAB" in the last 72 hours.  Invalid input(s): "FREET3" Anemia work up No results for input(s): "VITAMINB12", "FOLATE", "FERRITIN", "TIBC", "IRON", "RETICCTPCT" in the last 72 hours. Urinalysis    Component Value Date/Time   COLORURINE YELLOW 12/13/2018 1100   APPEARANCEUR Cloudy (A) 12/13/2018 1100  LABSPEC 1.015 12/13/2018 1100   PHURINE 7.0 12/13/2018 1100    GLUCOSEU NEGATIVE 12/13/2018 1100   HGBUR NEGATIVE 12/13/2018 1100   BILIRUBINUR NEGATIVE 12/13/2018 1100   KETONESUR NEGATIVE 12/13/2018 1100   PROTEINUR NEGATIVE 12/13/2017 0314   UROBILINOGEN 0.2 12/13/2018 1100   NITRITE POSITIVE (A) 12/13/2018 1100   LEUKOCYTESUR NEGATIVE 12/13/2018 1100   Sepsis Labs Recent Labs  Lab 12/31/22 0514 01/01/23 0451 01/02/23 0504 01/03/23 0515  WBC 10.7* 8.3 7.9 6.0   Microbiology Recent Results (from the past 240 hour(s))  MRSA Next Gen by PCR, Nasal     Status: Abnormal   Collection Time: 12/31/22 12:18 AM   Specimen: Nasal Mucosa; Nasal Swab  Result Value Ref Range Status   MRSA by PCR Next Gen DETECTED (A) NOT DETECTED Final    Comment: RESULT CALLED TO, READ BACK BY AND VERIFIED WITH: BROWN @ 0622 ON [id number] BY HENDERSON L (NOTE) The GeneXpert MRSA Assay (FDA approved for NASAL specimens only), is one component of a comprehensive MRSA colonization surveillance program. It is not intended to diagnose MRSA infection nor to guide or monitor treatment for MRSA infections. Test performance is not FDA approved in patients less than 46 years old. Performed at [facility], [address], [place], [place] [address]      Time coordinating discharge: 35 minutes  SIGNED:   Erick Blinks, DO [facility] 01/03/2023, 9:33 AM  If 7PM-7AM, please contact night-coverage [email or web address]

## 2023-01-03 NOTE — Progress Notes (Signed)
Called [facility] for report fowarded to VM left message for nurse to call me back

## 2023-01-03 NOTE — TOC Transition Note (Signed)
Transition of Care [facility]) - CM/SW Discharge Note   Patient Details  Name: Tina Hester MRN: [id number] Date of Birth: 08/16/1941  Transition of Care [facility]) CM/SW Contact:  Elliot Gault, LCSW Phone Number: 01/03/2023, 11:18 AM   Clinical Narrative:     Pt stable for dc to SNF today per MD. Pt does have insurance auth for SNF and EMS transport. Updated pt/dtr and they remain in agreement with the dc plan.  Updated Debbie at [facility]. They can admit today. DC clinical sent electronically. RN to call report. EMS arranged.  No other TOC needs for dc.  Final next level of care: Skilled Nursing Facility Barriers to Discharge: Barriers Resolved   Patient Goals and CMS Choice      Discharge Placement                Patient chooses bed at: Other - please specify in the comment section below: [facility]) Patient to be transferred to facility by: EMS Name of family member notified: [name] Patient and family notified of of transfer: 01/03/23  Discharge Plan and Services Additional resources added to the After Visit Summary for                                       Social Determinants of Health (SDOH) Interventions SDOH Screenings   Food Insecurity: No Food Insecurity (12/30/2022)  Housing: Low Risk  (12/30/2022)  Transportation Needs: No Transportation Needs (12/30/2022)  Utilities: Not At Risk (12/30/2022)  Depression (PHQ2-9): Low Risk  (06/23/2020)  Tobacco Use: Low Risk  (12/30/2022)     Readmission Risk Interventions     No data to display

## 2023-01-03 NOTE — Care Management Important Message (Signed)
Important Message  Patient Details  Name: Tina Hester MRN: [id number] Date of Birth: 05-23-1941   Medicare Important Message Given:  Yes     Corey Harold 01/03/2023, 10:06 AM

## 2023-01-04 DIAGNOSIS — S91301D Unspecified open wound, right foot, subsequent encounter: Secondary | ICD-10-CM | POA: Diagnosis not present

## 2023-01-04 DIAGNOSIS — K219 Gastro-esophageal reflux disease without esophagitis: Secondary | ICD-10-CM | POA: Diagnosis not present

## 2023-01-04 DIAGNOSIS — L03115 Cellulitis of right lower limb: Secondary | ICD-10-CM | POA: Diagnosis not present

## 2023-01-04 DIAGNOSIS — I509 Heart failure, unspecified: Secondary | ICD-10-CM | POA: Diagnosis not present

## 2023-01-04 DIAGNOSIS — F419 Anxiety disorder, unspecified: Secondary | ICD-10-CM | POA: Diagnosis not present

## 2023-01-04 DIAGNOSIS — E785 Hyperlipidemia, unspecified: Secondary | ICD-10-CM | POA: Diagnosis not present

## 2023-01-04 DIAGNOSIS — I4819 Other persistent atrial fibrillation: Secondary | ICD-10-CM | POA: Diagnosis not present

## 2023-01-04 DIAGNOSIS — I10 Essential (primary) hypertension: Secondary | ICD-10-CM | POA: Diagnosis not present

## 2023-01-05 DIAGNOSIS — L03115 Cellulitis of right lower limb: Secondary | ICD-10-CM | POA: Diagnosis not present

## 2023-01-05 DIAGNOSIS — M109 Gout, unspecified: Secondary | ICD-10-CM | POA: Diagnosis not present

## 2023-01-05 DIAGNOSIS — S91309A Unspecified open wound, unspecified foot, initial encounter: Secondary | ICD-10-CM | POA: Diagnosis not present

## 2023-01-05 DIAGNOSIS — I4891 Unspecified atrial fibrillation: Secondary | ICD-10-CM | POA: Diagnosis not present

## 2023-01-05 DIAGNOSIS — E782 Mixed hyperlipidemia: Secondary | ICD-10-CM | POA: Diagnosis not present

## 2023-01-05 DIAGNOSIS — F0280 Dementia in other diseases classified elsewhere without behavioral disturbance: Secondary | ICD-10-CM | POA: Diagnosis not present

## 2023-01-05 DIAGNOSIS — K219 Gastro-esophageal reflux disease without esophagitis: Secondary | ICD-10-CM | POA: Diagnosis not present

## 2023-01-05 DIAGNOSIS — F329 Major depressive disorder, single episode, unspecified: Secondary | ICD-10-CM | POA: Diagnosis not present

## 2023-01-06 ENCOUNTER — Ambulatory Visit: Payer: HMO | Admitting: Podiatry

## 2023-01-06 DIAGNOSIS — M109 Gout, unspecified: Secondary | ICD-10-CM | POA: Diagnosis not present

## 2023-01-06 DIAGNOSIS — I4819 Other persistent atrial fibrillation: Secondary | ICD-10-CM | POA: Diagnosis not present

## 2023-01-06 DIAGNOSIS — F419 Anxiety disorder, unspecified: Secondary | ICD-10-CM | POA: Diagnosis not present

## 2023-01-06 DIAGNOSIS — G629 Polyneuropathy, unspecified: Secondary | ICD-10-CM | POA: Diagnosis not present

## 2023-01-06 DIAGNOSIS — E785 Hyperlipidemia, unspecified: Secondary | ICD-10-CM | POA: Diagnosis not present

## 2023-01-06 DIAGNOSIS — F0280 Dementia in other diseases classified elsewhere without behavioral disturbance: Secondary | ICD-10-CM | POA: Diagnosis not present

## 2023-01-06 DIAGNOSIS — S91301D Unspecified open wound, right foot, subsequent encounter: Secondary | ICD-10-CM | POA: Diagnosis not present

## 2023-01-06 DIAGNOSIS — I10 Essential (primary) hypertension: Secondary | ICD-10-CM | POA: Diagnosis not present

## 2023-01-07 ENCOUNTER — Telehealth (HOSPITAL_COMMUNITY): Payer: Self-pay | Admitting: Radiology

## 2023-01-07 DIAGNOSIS — L03115 Cellulitis of right lower limb: Secondary | ICD-10-CM | POA: Diagnosis not present

## 2023-01-07 DIAGNOSIS — E569 Vitamin deficiency, unspecified: Secondary | ICD-10-CM | POA: Diagnosis not present

## 2023-01-07 NOTE — Telephone Encounter (Signed)
01/07/23@935  spoke with daughter [name].  Patient is now in rehab. Will call back when ready to schedule. EVD

## 2023-01-09 DIAGNOSIS — E569 Vitamin deficiency, unspecified: Secondary | ICD-10-CM | POA: Diagnosis not present

## 2023-01-09 DIAGNOSIS — F329 Major depressive disorder, single episode, unspecified: Secondary | ICD-10-CM | POA: Diagnosis not present

## 2023-01-09 DIAGNOSIS — G629 Polyneuropathy, unspecified: Secondary | ICD-10-CM | POA: Diagnosis not present

## 2023-01-09 DIAGNOSIS — M109 Gout, unspecified: Secondary | ICD-10-CM | POA: Diagnosis not present

## 2023-01-09 DIAGNOSIS — F0390 Unspecified dementia without behavioral disturbance: Secondary | ICD-10-CM | POA: Diagnosis not present

## 2023-01-09 DIAGNOSIS — F0280 Dementia in other diseases classified elsewhere without behavioral disturbance: Secondary | ICD-10-CM | POA: Diagnosis not present

## 2023-01-09 DIAGNOSIS — M79676 Pain in unspecified toe(s): Secondary | ICD-10-CM | POA: Diagnosis not present

## 2023-01-09 DIAGNOSIS — E785 Hyperlipidemia, unspecified: Secondary | ICD-10-CM | POA: Diagnosis not present

## 2023-01-09 DIAGNOSIS — D72829 Elevated white blood cell count, unspecified: Secondary | ICD-10-CM | POA: Diagnosis not present

## 2023-01-09 DIAGNOSIS — L97412 Non-pressure chronic ulcer of right heel and midfoot with fat layer exposed: Secondary | ICD-10-CM | POA: Diagnosis not present

## 2023-01-09 DIAGNOSIS — S91301D Unspecified open wound, right foot, subsequent encounter: Secondary | ICD-10-CM | POA: Diagnosis not present

## 2023-01-09 DIAGNOSIS — E538 Deficiency of other specified B group vitamins: Secondary | ICD-10-CM | POA: Diagnosis not present

## 2023-01-09 DIAGNOSIS — R41841 Cognitive communication deficit: Secondary | ICD-10-CM | POA: Diagnosis not present

## 2023-01-09 DIAGNOSIS — E559 Vitamin D deficiency, unspecified: Secondary | ICD-10-CM | POA: Diagnosis not present

## 2023-01-09 DIAGNOSIS — R112 Nausea with vomiting, unspecified: Secondary | ICD-10-CM | POA: Diagnosis not present

## 2023-01-09 DIAGNOSIS — I509 Heart failure, unspecified: Secondary | ICD-10-CM | POA: Diagnosis not present

## 2023-01-09 DIAGNOSIS — A000 Cholera due to Vibrio cholerae 01, biovar cholerae: Secondary | ICD-10-CM | POA: Diagnosis not present

## 2023-01-09 DIAGNOSIS — I4819 Other persistent atrial fibrillation: Secondary | ICD-10-CM | POA: Diagnosis not present

## 2023-01-09 DIAGNOSIS — H40002 Preglaucoma, unspecified, left eye: Secondary | ICD-10-CM | POA: Diagnosis not present

## 2023-01-09 DIAGNOSIS — E1165 Type 2 diabetes mellitus with hyperglycemia: Secondary | ICD-10-CM | POA: Diagnosis not present

## 2023-01-09 DIAGNOSIS — K219 Gastro-esophageal reflux disease without esophagitis: Secondary | ICD-10-CM | POA: Diagnosis not present

## 2023-01-09 DIAGNOSIS — F419 Anxiety disorder, unspecified: Secondary | ICD-10-CM | POA: Diagnosis not present

## 2023-01-09 DIAGNOSIS — L03115 Cellulitis of right lower limb: Secondary | ICD-10-CM | POA: Diagnosis not present

## 2023-01-09 DIAGNOSIS — F32A Depression, unspecified: Secondary | ICD-10-CM | POA: Diagnosis not present

## 2023-01-09 DIAGNOSIS — R1110 Vomiting, unspecified: Secondary | ICD-10-CM | POA: Diagnosis not present

## 2023-01-09 DIAGNOSIS — M6281 Muscle weakness (generalized): Secondary | ICD-10-CM | POA: Diagnosis not present

## 2023-01-09 DIAGNOSIS — E039 Hypothyroidism, unspecified: Secondary | ICD-10-CM | POA: Diagnosis not present

## 2023-01-09 DIAGNOSIS — I10 Essential (primary) hypertension: Secondary | ICD-10-CM | POA: Diagnosis not present

## 2023-01-09 DIAGNOSIS — R2681 Unsteadiness on feet: Secondary | ICD-10-CM | POA: Diagnosis not present

## 2023-01-09 DIAGNOSIS — R0989 Other specified symptoms and signs involving the circulatory and respiratory systems: Secondary | ICD-10-CM | POA: Diagnosis not present

## 2023-01-09 DIAGNOSIS — B351 Tinea unguium: Secondary | ICD-10-CM | POA: Diagnosis not present

## 2023-01-10 DIAGNOSIS — E785 Hyperlipidemia, unspecified: Secondary | ICD-10-CM | POA: Diagnosis not present

## 2023-01-10 DIAGNOSIS — F0280 Dementia in other diseases classified elsewhere without behavioral disturbance: Secondary | ICD-10-CM | POA: Diagnosis not present

## 2023-01-10 DIAGNOSIS — L03115 Cellulitis of right lower limb: Secondary | ICD-10-CM | POA: Diagnosis not present

## 2023-01-10 DIAGNOSIS — I10 Essential (primary) hypertension: Secondary | ICD-10-CM | POA: Diagnosis not present

## 2023-01-10 DIAGNOSIS — K219 Gastro-esophageal reflux disease without esophagitis: Secondary | ICD-10-CM | POA: Diagnosis not present

## 2023-01-10 DIAGNOSIS — S91301D Unspecified open wound, right foot, subsequent encounter: Secondary | ICD-10-CM | POA: Diagnosis not present

## 2023-01-10 DIAGNOSIS — I4819 Other persistent atrial fibrillation: Secondary | ICD-10-CM | POA: Diagnosis not present

## 2023-01-10 DIAGNOSIS — I509 Heart failure, unspecified: Secondary | ICD-10-CM | POA: Diagnosis not present

## 2023-01-11 DIAGNOSIS — E569 Vitamin deficiency, unspecified: Secondary | ICD-10-CM | POA: Diagnosis not present

## 2023-01-11 DIAGNOSIS — E039 Hypothyroidism, unspecified: Secondary | ICD-10-CM | POA: Diagnosis not present

## 2023-01-11 DIAGNOSIS — R1110 Vomiting, unspecified: Secondary | ICD-10-CM | POA: Diagnosis not present

## 2023-01-11 DIAGNOSIS — K219 Gastro-esophageal reflux disease without esophagitis: Secondary | ICD-10-CM | POA: Diagnosis not present

## 2023-01-12 DIAGNOSIS — R112 Nausea with vomiting, unspecified: Secondary | ICD-10-CM | POA: Diagnosis not present

## 2023-01-14 DIAGNOSIS — I10 Essential (primary) hypertension: Secondary | ICD-10-CM | POA: Diagnosis not present

## 2023-01-14 DIAGNOSIS — L03115 Cellulitis of right lower limb: Secondary | ICD-10-CM | POA: Diagnosis not present

## 2023-01-14 DIAGNOSIS — E538 Deficiency of other specified B group vitamins: Secondary | ICD-10-CM | POA: Diagnosis not present

## 2023-01-14 DIAGNOSIS — F329 Major depressive disorder, single episode, unspecified: Secondary | ICD-10-CM | POA: Diagnosis not present

## 2023-01-14 DIAGNOSIS — E569 Vitamin deficiency, unspecified: Secondary | ICD-10-CM | POA: Diagnosis not present

## 2023-01-14 DIAGNOSIS — E559 Vitamin D deficiency, unspecified: Secondary | ICD-10-CM | POA: Diagnosis not present

## 2023-01-14 DIAGNOSIS — G629 Polyneuropathy, unspecified: Secondary | ICD-10-CM | POA: Diagnosis not present

## 2023-01-14 DIAGNOSIS — M109 Gout, unspecified: Secondary | ICD-10-CM | POA: Diagnosis not present

## 2023-01-17 DIAGNOSIS — M109 Gout, unspecified: Secondary | ICD-10-CM | POA: Diagnosis not present

## 2023-01-17 DIAGNOSIS — E559 Vitamin D deficiency, unspecified: Secondary | ICD-10-CM | POA: Diagnosis not present

## 2023-01-17 DIAGNOSIS — F329 Major depressive disorder, single episode, unspecified: Secondary | ICD-10-CM | POA: Diagnosis not present

## 2023-01-17 DIAGNOSIS — L03115 Cellulitis of right lower limb: Secondary | ICD-10-CM | POA: Diagnosis not present

## 2023-01-17 DIAGNOSIS — G629 Polyneuropathy, unspecified: Secondary | ICD-10-CM | POA: Diagnosis not present

## 2023-01-17 DIAGNOSIS — I10 Essential (primary) hypertension: Secondary | ICD-10-CM | POA: Diagnosis not present

## 2023-01-17 DIAGNOSIS — E569 Vitamin deficiency, unspecified: Secondary | ICD-10-CM | POA: Diagnosis not present

## 2023-01-17 DIAGNOSIS — E538 Deficiency of other specified B group vitamins: Secondary | ICD-10-CM | POA: Diagnosis not present

## 2023-01-18 ENCOUNTER — Encounter: Payer: Self-pay | Admitting: Podiatry

## 2023-01-18 ENCOUNTER — Ambulatory Visit (INDEPENDENT_AMBULATORY_CARE_PROVIDER_SITE_OTHER): Payer: PPO | Admitting: Podiatry

## 2023-01-18 DIAGNOSIS — B351 Tinea unguium: Secondary | ICD-10-CM

## 2023-01-18 DIAGNOSIS — L97412 Non-pressure chronic ulcer of right heel and midfoot with fat layer exposed: Secondary | ICD-10-CM

## 2023-01-18 DIAGNOSIS — R0989 Other specified symptoms and signs involving the circulatory and respiratory systems: Secondary | ICD-10-CM

## 2023-01-18 DIAGNOSIS — M79676 Pain in unspecified toe(s): Secondary | ICD-10-CM | POA: Diagnosis not present

## 2023-01-18 DIAGNOSIS — D649 Anemia, unspecified: Secondary | ICD-10-CM | POA: Insufficient documentation

## 2023-01-18 NOTE — Progress Notes (Signed)
She presents today with her daughter and would like to have her nails cut and she also has a wound to the posterior aspect of her right heel.  Currently not seeing wound care and not seeing anyone else for her nails.  Objective: Vital signs stable alert oriented x 3 pulses are nonpalpable bilateral.  Neurologic sensorium is diminished per [facility] the monofilament right foot as well as left.  Muscle strength is diminished bilateral.  There is a eschar to the posterior aspect of her right heel no surrounding erythema no purulence no malodor no fluctuance it is firm there is no motion with this.  Measures 3.0 cm x 3.0 cm.  This is nontender.  Toenails are thick long and yellow dystrophic clinically mycotic.  Assessment: Pain limb secondary to onychomycosis and eschar right foot peripheral vascular disease.  Plan: Debrided toenails 1 through 5 bilateral referring to wound care referring to vascular for consult and evaluation

## 2023-01-19 DIAGNOSIS — G629 Polyneuropathy, unspecified: Secondary | ICD-10-CM | POA: Diagnosis not present

## 2023-01-19 DIAGNOSIS — L03115 Cellulitis of right lower limb: Secondary | ICD-10-CM | POA: Diagnosis not present

## 2023-01-19 DIAGNOSIS — E538 Deficiency of other specified B group vitamins: Secondary | ICD-10-CM | POA: Diagnosis not present

## 2023-01-19 DIAGNOSIS — F329 Major depressive disorder, single episode, unspecified: Secondary | ICD-10-CM | POA: Diagnosis not present

## 2023-01-19 DIAGNOSIS — E569 Vitamin deficiency, unspecified: Secondary | ICD-10-CM | POA: Diagnosis not present

## 2023-01-19 DIAGNOSIS — E559 Vitamin D deficiency, unspecified: Secondary | ICD-10-CM | POA: Diagnosis not present

## 2023-01-19 DIAGNOSIS — M109 Gout, unspecified: Secondary | ICD-10-CM | POA: Diagnosis not present

## 2023-01-19 DIAGNOSIS — I10 Essential (primary) hypertension: Secondary | ICD-10-CM | POA: Diagnosis not present

## 2023-01-21 ENCOUNTER — Ambulatory Visit (HOSPITAL_COMMUNITY): Payer: PPO | Attending: Podiatry

## 2023-01-26 ENCOUNTER — Emergency Department (HOSPITAL_COMMUNITY)
Admission: EM | Admit: 2023-01-26 | Discharge: 2023-01-26 | Disposition: A | Discharge status: Home / Self Care | Payer: PPO | Attending: Emergency Medicine | Admitting: Emergency Medicine

## 2023-01-26 ENCOUNTER — Encounter (HOSPITAL_COMMUNITY): Payer: Self-pay | Admitting: Emergency Medicine

## 2023-01-26 ENCOUNTER — Other Ambulatory Visit: Payer: Self-pay

## 2023-01-26 ENCOUNTER — Emergency Department (HOSPITAL_COMMUNITY): Payer: PPO

## 2023-01-26 DIAGNOSIS — E162 Hypoglycemia, unspecified: Secondary | ICD-10-CM

## 2023-01-26 DIAGNOSIS — Z7901 Long term (current) use of anticoagulants: Secondary | ICD-10-CM | POA: Insufficient documentation

## 2023-01-26 DIAGNOSIS — Z794 Long term (current) use of insulin: Secondary | ICD-10-CM | POA: Diagnosis not present

## 2023-01-26 DIAGNOSIS — R9431 Abnormal electrocardiogram [ECG] [EKG]: Secondary | ICD-10-CM | POA: Diagnosis not present

## 2023-01-26 DIAGNOSIS — Z7984 Long term (current) use of oral hypoglycemic drugs: Secondary | ICD-10-CM | POA: Insufficient documentation

## 2023-01-26 DIAGNOSIS — R410 Disorientation, unspecified: Secondary | ICD-10-CM | POA: Diagnosis not present

## 2023-01-26 DIAGNOSIS — M25551 Pain in right hip: Secondary | ICD-10-CM | POA: Diagnosis not present

## 2023-01-26 DIAGNOSIS — R404 Transient alteration of awareness: Secondary | ICD-10-CM | POA: Diagnosis not present

## 2023-01-26 DIAGNOSIS — E11649 Type 2 diabetes mellitus with hypoglycemia without coma: Secondary | ICD-10-CM | POA: Diagnosis not present

## 2023-01-26 DIAGNOSIS — E161 Other hypoglycemia: Secondary | ICD-10-CM | POA: Diagnosis not present

## 2023-01-26 DIAGNOSIS — R231 Pallor: Secondary | ICD-10-CM | POA: Diagnosis not present

## 2023-01-26 LAB — CBG MONITORING, ED
Glucose-Capillary: 100 mg/dL — ABNORMAL HIGH (ref 70–99)
Glucose-Capillary: 128 mg/dL — ABNORMAL HIGH (ref 70–99)
Glucose-Capillary: 163 mg/dL — ABNORMAL HIGH (ref 70–99)

## 2023-01-26 LAB — BASIC METABOLIC PANEL
Anion gap: 11 (ref 5–15)
BUN: 30 mg/dL — ABNORMAL HIGH (ref 8–23)
CO2: 19 mmol/L — ABNORMAL LOW (ref 22–32)
Calcium: 8.6 mg/dL — ABNORMAL LOW (ref 8.9–10.3)
Chloride: 105 mmol/L (ref 98–111)
Creatinine, Ser: 1.26 mg/dL — ABNORMAL HIGH (ref 0.44–1.00)
GFR, Estimated: 43 mL/min — ABNORMAL LOW (ref >60)
Glucose, Bld: 92 mg/dL (ref 70–99)
Potassium: 4.4 mmol/L (ref 3.5–5.1)
Sodium: 135 mmol/L (ref 135–145)

## 2023-01-26 LAB — CBC WITH DIFFERENTIAL/PLATELET
Abs Immature Granulocytes: 0.05 10*3/uL (ref 0.00–0.07)
Basophils Absolute: 0 10*3/uL (ref 0.0–0.1)
Basophils Relative: 0 %
Eosinophils Absolute: 0.1 10*3/uL (ref 0.0–0.5)
Eosinophils Relative: 1 %
HCT: 35.7 % — ABNORMAL LOW (ref 36.0–46.0)
Hemoglobin: 10.9 g/dL — ABNORMAL LOW (ref 12.0–15.0)
Immature Granulocytes: 1 %
Lymphocytes Relative: 22 %
Lymphs Abs: 1.7 10*3/uL (ref 0.7–4.0)
MCH: 28.5 pg (ref 26.0–34.0)
MCHC: 30.5 g/dL (ref 30.0–36.0)
MCV: 93.2 fL (ref 80.0–100.0)
Monocytes Absolute: 1 10*3/uL (ref 0.1–1.0)
Monocytes Relative: 13 %
Neutro Abs: 4.9 10*3/uL (ref 1.7–7.7)
Neutrophils Relative %: 63 %
Platelets: 173 10*3/uL (ref 150–400)
RBC: 3.83 MIL/uL — ABNORMAL LOW (ref 3.87–5.11)
RDW: 16.4 % — ABNORMAL HIGH (ref 11.5–15.5)
WBC: 7.6 10*3/uL (ref 4.0–10.5)
nRBC: 0 % (ref 0.0–0.2)

## 2023-01-26 NOTE — ED Provider Notes (Signed)
[place] EMERGENCY DEPARTMENT AT [facility] Provider Note   CSN: [id number] Arrival date & time: 01/26/23  0102     History  Chief Complaint  Patient presents with   Hypoglycemia    Tina Hester is a 82 y.o. female.  Patient with history of diabetes.  She had a hypoglycemic episode.  Her daughter takes care of her   Hypoglycemia      Home Medications Prior to Admission medications   Medication Sig Start Date End Date Taking? Authorizing Provider  amLODipine (NORVASC) 5 MG tablet TAKE ONE TABLET BY MOUTH EVERY MORNING 01/29/22  Yes Turner, Traci R, MD  atorvastatin (LIPITOR) 40 MG tablet TAKE ONE TABLET BY MOUTH EVERY EVENING 01/29/22  Yes Turner, Traci R, MD  donepezil (ARICEPT) 5 MG tablet Take 5 mg by mouth daily. 12/17/22  Yes [provider]  dronedarone (MULTAQ) 400 MG tablet    Yes [provider]  ELIQUIS 5 MG TABS tablet TAKE ONE TABLET BY MOUTH TWICE DAILY 07/15/22  Yes Turner, Cornelious Bryant, MD  ezetimibe (ZETIA) 10 MG tablet Take 10 mg by mouth daily. 12/17/22  Yes [provider]  glipiZIDE (GLUCOTROL) 10 MG tablet Take 10 mg by mouth daily before breakfast.   Yes [provider]  insulin glargine (LANTUS SOLOSTAR) 100 UNIT/ML Solostar Pen Inject 15 Units into the skin daily.   Yes [provider]  insulin lispro (HUMALOG) 100 UNIT/ML injection Inject 1-7 Units into the skin 3 (three) times daily before meals. Sliding scale 141-180=1 unit,181-220= 2 units,221-260= 3 units,261-300=4 units, 301-340=5 units, 341-380=6 units, 381-400 = 7 units,> 400 call MD   Yes [provider]  Insulin NPH, Human,, Isophane, (NOVOLIN N FLEXPEN) 100 UNIT/ML Kiwkpen Inject 15 units at morning and 18 units at night 09/24/21  Yes Reather Littler, MD  metFORMIN (GLUCOPHAGE) 500 MG tablet TAKE ONE TABLET BY MOUTH EVERY MORNING and TAKE TWO TABLETS BY MOUTH EVERY EVENING 07/28/22  Yes Reather Littler, MD  metoprolol tartrate (LOPRESSOR) 50  MG tablet Take 1 tablet (50 mg total) by mouth 2 (two) times daily. 05/07/22  Yes Turner, Cornelious Bryant, MD  olmesartan (BENICAR) 20 MG tablet Take 20 mg by mouth daily. 12/17/22  Yes [provider]  pioglitazone (ACTOS) 15 MG tablet Take 1 tablet (15 mg total) by mouth every morning. 07/28/22  Yes Reather Littler, MD  sertraline (ZOLOFT) 50 MG tablet Take 50 mg by mouth daily. 12/17/22  Yes [provider]  acetaminophen (TYLENOL) 325 MG tablet Take 500 mg by mouth every 4 (four) hours as needed for mild pain or moderate pain.    [provider]  ALPRAZolam Prudy Feeler) 0.25 MG tablet Take 1 tablet (0.25 mg total) by mouth at bedtime as needed for anxiety. 01/03/23   Sherryll Burger, Pratik D, DO      Allergies    Pneumococcal vaccine, Pneumovax 23 [pneumococcal vac polyvalent], Other, Sulfa antibiotics, and Pregabalin    Review of Systems   Review of Systems  Physical Exam Updated Vital Signs BP (!) 140/122   Pulse 63   Temp (!) 97.5 F (36.4 C) (Oral)   Resp 16   SpO2 100%  Physical Exam  ED Results / Procedures / Treatments   Labs (all labs ordered are listed, but only abnormal results are displayed) Labs Reviewed  CBC WITH DIFFERENTIAL/PLATELET - Abnormal; Notable for the following components:      Result Value   RBC 3.83 (*)    Hemoglobin 10.9 (*)  HCT 35.7 (*)    RDW 16.4 (*)    All other components within normal limits  BASIC METABOLIC PANEL - Abnormal; Notable for the following components:   CO2 19 (*)    BUN 30 (*)    Creatinine, Ser 1.26 (*)    Calcium 8.6 (*)    GFR, Estimated 43 (*)    All other components within normal limits  CBG MONITORING, ED - Abnormal; Notable for the following components:   Glucose-Capillary 100 (*)    All other components within normal limits  CBG MONITORING, ED - Abnormal; Notable for the following components:   Glucose-Capillary 128 (*)    All other components within normal limits  CBG MONITORING, ED - Abnormal; Notable for the  following components:   Glucose-Capillary 163 (*)    All other components within normal limits    EKG EKG Interpretation  Date/Time:  Wednesday Jan 26 2023 08:22:48 EDT Ventricular Rate:  54 PR Interval:    QRS Duration: 117 QT Interval:  496 QTC Calculation: 471 R Axis:   -33 Text Interpretation: Atrial fibrillation Incomplete left bundle branch block Consider anterior infarct Confirmed by Bethann Berkshire [phone]) on 01/26/2023 12:53:55 PM  Radiology DG Hip Unilat W or Wo Pelvis 2-3 Views Right  Result Date: 01/26/2023 CLINICAL DATA:  Pain EXAM: DG HIP (WITH OR WITHOUT PELVIS) 3V RIGHT COMPARISON:  None Available. FINDINGS: No fracture or dislocation. Slight joint space loss of the hips and sacroiliac joints with hyperostosis. Scattered vascular calcifications. IMPRESSION: Chronic changes with some mild degenerative changes and hyperostosis. Osteopenia. Electronically Signed   By: Karen Kays M.D.   On: 01/26/2023 11:00    Procedures Procedures    Medications Ordered in ED Medications - No data to display  ED Course/ Medical Decision Making/ A&P                             Medical Decision Making Amount and/or Complexity of Data Reviewed Labs: ordered. Radiology: ordered.  C-Myc episode.  She will stop taking her glargine and she will decrease her NPH in the p.m. from 18-14 and in the a.m. from 15-12.  Patient will follow-up with PCP        Final Clinical Impression(s) / ED Diagnoses Final diagnoses:  Hypoglycemia    Rx / DC Orders ED Discharge Orders     None         Bethann Berkshire, MD 01/26/23 1256

## 2023-01-26 NOTE — Discharge Instructions (Signed)
Stop taking the insulin glargine.  Decrease your NPH in the evening from 18 units down to 14 units.  Decrease your NPH in the morning from 15 units down to 12 units.  Check the sugar before each meal and at bedtime and record it.  Follow-up with your doctor next week to discuss your sugar results

## 2023-01-26 NOTE — ED Triage Notes (Signed)
Pt daughter checked sugar this am due to AMS and pt was on floor and was 43. Ems called, iv started in route and one amp d50 given in route. Pt arrived a/o. Color wnl. Non diaphoretic.

## 2023-01-28 DIAGNOSIS — K219 Gastro-esophageal reflux disease without esophagitis: Secondary | ICD-10-CM | POA: Diagnosis not present

## 2023-01-28 DIAGNOSIS — E538 Deficiency of other specified B group vitamins: Secondary | ICD-10-CM | POA: Diagnosis not present

## 2023-01-28 DIAGNOSIS — Z9181 History of falling: Secondary | ICD-10-CM | POA: Diagnosis not present

## 2023-01-28 DIAGNOSIS — E559 Vitamin D deficiency, unspecified: Secondary | ICD-10-CM | POA: Diagnosis not present

## 2023-01-28 DIAGNOSIS — E782 Mixed hyperlipidemia: Secondary | ICD-10-CM | POA: Diagnosis not present

## 2023-01-28 DIAGNOSIS — E1165 Type 2 diabetes mellitus with hyperglycemia: Secondary | ICD-10-CM | POA: Diagnosis not present

## 2023-01-28 DIAGNOSIS — M109 Gout, unspecified: Secondary | ICD-10-CM | POA: Diagnosis not present

## 2023-01-28 DIAGNOSIS — E039 Hypothyroidism, unspecified: Secondary | ICD-10-CM | POA: Diagnosis not present

## 2023-01-28 DIAGNOSIS — F0284 Dementia in other diseases classified elsewhere, unspecified severity, with anxiety: Secondary | ICD-10-CM | POA: Diagnosis not present

## 2023-01-28 DIAGNOSIS — Z794 Long term (current) use of insulin: Secondary | ICD-10-CM | POA: Diagnosis not present

## 2023-01-28 DIAGNOSIS — F0283 Dementia in other diseases classified elsewhere, unspecified severity, with mood disturbance: Secondary | ICD-10-CM | POA: Diagnosis not present

## 2023-01-28 DIAGNOSIS — I10 Essential (primary) hypertension: Secondary | ICD-10-CM | POA: Diagnosis not present

## 2023-01-28 DIAGNOSIS — E1140 Type 2 diabetes mellitus with diabetic neuropathy, unspecified: Secondary | ICD-10-CM | POA: Diagnosis not present

## 2023-01-28 DIAGNOSIS — L89620 Pressure ulcer of left heel, unstageable: Secondary | ICD-10-CM | POA: Diagnosis not present

## 2023-01-28 DIAGNOSIS — L03115 Cellulitis of right lower limb: Secondary | ICD-10-CM | POA: Diagnosis not present

## 2023-01-28 DIAGNOSIS — Z7901 Long term (current) use of anticoagulants: Secondary | ICD-10-CM | POA: Diagnosis not present

## 2023-01-28 DIAGNOSIS — Z7984 Long term (current) use of oral hypoglycemic drugs: Secondary | ICD-10-CM | POA: Diagnosis not present

## 2023-01-28 DIAGNOSIS — Z556 Problems related to health literacy: Secondary | ICD-10-CM | POA: Diagnosis not present

## 2023-01-28 DIAGNOSIS — I4891 Unspecified atrial fibrillation: Secondary | ICD-10-CM | POA: Diagnosis not present

## 2023-01-31 ENCOUNTER — Other Ambulatory Visit: Payer: Self-pay | Admitting: *Deleted

## 2023-01-31 DIAGNOSIS — L97419 Non-pressure chronic ulcer of right heel and midfoot with unspecified severity: Secondary | ICD-10-CM

## 2023-02-02 ENCOUNTER — Encounter (HOSPITAL_BASED_OUTPATIENT_CLINIC_OR_DEPARTMENT_OTHER): Payer: PPO | Admitting: Physician Assistant

## 2023-02-03 DIAGNOSIS — I4891 Unspecified atrial fibrillation: Secondary | ICD-10-CM | POA: Diagnosis not present

## 2023-02-03 DIAGNOSIS — Z794 Long term (current) use of insulin: Secondary | ICD-10-CM | POA: Diagnosis not present

## 2023-02-03 DIAGNOSIS — Z79899 Other long term (current) drug therapy: Secondary | ICD-10-CM | POA: Diagnosis not present

## 2023-02-03 DIAGNOSIS — M25551 Pain in right hip: Secondary | ICD-10-CM | POA: Diagnosis not present

## 2023-02-03 DIAGNOSIS — I959 Hypotension, unspecified: Secondary | ICD-10-CM | POA: Diagnosis not present

## 2023-02-03 DIAGNOSIS — E119 Type 2 diabetes mellitus without complications: Secondary | ICD-10-CM | POA: Diagnosis not present

## 2023-02-03 DIAGNOSIS — Z6841 Body Mass Index (BMI) 40.0 and over, adult: Secondary | ICD-10-CM | POA: Diagnosis not present

## 2023-02-07 NOTE — H&P (View-Only) (Signed)
[facility] [place]  ASSESSMENT / PLAN: Tina Hester is a 82 y.o. female with atherosclerosis of native arteries of right lower extremity causing heel ulceration.  Patient counseled patients with chronic limb threatening ischemia have an annual risk of cardiovascular mortality of 25% and a high risk of amputation.   WIfI score calculated based on clinical exam and non-invasive measurements. 3 / 0 / 0, Stage III.  Recommend the following which can slow the progression of atherosclerosis and reduce the risk of major adverse cardiac / limb events:  Complete cessation from all tobacco products. Blood glucose control with goal A1c < 7%. Blood pressure control with goal blood pressure < 140/90 mmHg. Lipid reduction therapy with goal LDL-C <100 mg/dL (<70 if symptomatic from PAD).  Aspirin 81mg PO QD.  Atorvastatin 40-80mg PO QD (or other "high intensity" statin therapy).  Plan right lower extremity angiogram with possible intervention via left femoral approach in cath lab as schedule allows.  CHIEF COMPLAINT: right heel ulcer  HISTORY OF PRESENT ILLNESS: Tina Hester is a 82 y.o. female who presents to clinic for evaluation of right heel unstageable ulceration.  This has been present for several months.  The patient has neuropathy and no feeling in her feet.  This happened during an episode of lower extremity edema.  Her sister applied an elevating slipper which wore an ulcer on her right heel.  This is not healed since.  She does not walk enough to claudicate, but does transfer plan is able to do some activities of daily living.   Past Medical History:  Diagnosis Date   Anemia    s/p Heme work -up normal EGD and colonscopy in 2012 per pt,neg SPEP   Anxiety    Aortic stenosis    Mild by echo 12/2021 with mean aortic valve gradient 17 mmHg   Bilateral carotid artery stenosis 06/08/2015   1-39% bilateral   Bradycardia    CKD (chronic kidney disease),  stage III (HCC)    Diabetes mellitus without complication (HCC)    type 2   GERD (gastroesophageal reflux disease)    Gout    HSV-1 (herpes simplex virus 1) infection    Acyclovir prn   Hyperkalemia    Hyperlipidemia    Hypertension    Joint pain    osteoarthritis by Xray- possible erosion of R 4th MCP,elevated uric  acid ,ANA +    MCI (mild cognitive impairment)    Morbid obesity (HCC)    OSA on CPAP    Osteopenia    PAF (paroxysmal atrial fibrillation) (HCC)    Pain management    Neurosurg: Dr Paul Harkins   Pulmonary HTN (HCC)    Moderate by echo  2016. PASP 41mmHg   TIA (transient ischemic attack)    remote history of TIA's early 2000    Past Surgical History:  Procedure Laterality Date   ABDOMINAL HYSTERECTOMY     APPENDECTOMY     BACK SURGERY     cervical fusion   BLADDER SURGERY     CESAREAN SECTION     CHOLECYSTECTOMY      Family History  Problem Relation Age of Onset   Arrhythmia Brother     Social History   Socioeconomic History   Marital status: Divorced    Spouse name: Not on file   Number of children: 4   Years of education: Not on file   Highest education level: Some college, no degree  Occupational History     Not on file  Tobacco Use   Smoking status: Never   Smokeless tobacco: Never  Vaping Use   Vaping Use: Never used  Substance and Sexual Activity   Alcohol use: No   Drug use: No   Sexual activity: Not on file  Other Topics Concern   Not on file  Social History Narrative   10/27/21 lives with daughter, [name]   Some soda   Social Determinants of Health   Financial Resource Strain: Not on file  Food Insecurity: No Food Insecurity (12/30/2022)   Hunger Vital Sign    Worried About Running Out of Food in the Last Year: Never true    Ran Out of Food in the Last Year: Never true  Transportation Needs: No Transportation Needs (12/30/2022)   PRAPARE - Transportation    Lack of Transportation (Medical): No    Lack of Transportation  (Non-Medical): No  Physical Activity: Not on file  Stress: Not on file  Social Connections: Not on file  Intimate Partner Violence: Not At Risk (12/30/2022)   Humiliation, Afraid, Rape, and Kick questionnaire    Fear of Current or Ex-Partner: No    Emotionally Abused: No    Physically Abused: No    Sexually Abused: No    Allergies  Allergen Reactions   Pneumococcal Vaccine Anaphylaxis    weakness   Pneumovax 23 [Pneumococcal Vac Polyvalent] Anaphylaxis    weakness   Other Hives    Other reaction(s): Other (See Comments) Pneumonia  Vaccine- very ill   Sulfa Antibiotics Hives   Pregabalin     Other reaction(s): depression    Current Outpatient Medications  Medication Sig Dispense Refill   acetaminophen (TYLENOL) 325 MG tablet Take 500 mg by mouth every 4 (four) hours as needed for mild pain or moderate pain.     ALPRAZolam (XANAX) 0.25 MG tablet Take 1 tablet (0.25 mg total) by mouth at bedtime as needed for anxiety. 10 tablet 0   amLODipine (NORVASC) 5 MG tablet TAKE ONE TABLET BY MOUTH EVERY MORNING 90 tablet 3   atorvastatin (LIPITOR) 40 MG tablet TAKE ONE TABLET BY MOUTH EVERY EVENING 90 tablet 3   donepezil (ARICEPT) 5 MG tablet Take 5 mg by mouth daily.     dronedarone (MULTAQ) 400 MG tablet      ELIQUIS 5 MG TABS tablet TAKE ONE TABLET BY MOUTH TWICE DAILY 180 tablet 1   ezetimibe (ZETIA) 10 MG tablet Take 10 mg by mouth daily.     glipiZIDE (GLUCOTROL) 10 MG tablet Take 10 mg by mouth daily before breakfast.     insulin glargine (LANTUS SOLOSTAR) 100 UNIT/ML Solostar Pen Inject 15 Units into the skin daily.     insulin lispro (HUMALOG) 100 UNIT/ML injection Inject 1-7 Units into the skin 3 (three) times daily before meals. Sliding scale 141-180=1 unit,181-220= 2 units,221-260= 3 units,261-300=4 units, 301-340=5 units, 341-380=6 units, 381-400 = 7 units,> 400 call MD     Insulin NPH, Human,, Isophane, (NOVOLIN N FLEXPEN) 100 UNIT/ML Kiwkpen Inject 15 units at morning and 18  units at night 15 mL 2   metFORMIN (GLUCOPHAGE) 500 MG tablet TAKE ONE TABLET BY MOUTH EVERY MORNING and TAKE TWO TABLETS BY MOUTH EVERY EVENING 270 tablet 0   metoprolol tartrate (LOPRESSOR) 50 MG tablet Take 1 tablet (50 mg total) by mouth 2 (two) times daily. 180 tablet 3   olmesartan (BENICAR) 20 MG tablet Take 20 mg by mouth daily.     pioglitazone (ACTOS) 15 MG tablet   Take 1 tablet (15 mg total) by mouth every morning. 90 tablet 0   sertraline (ZOLOFT) 50 MG tablet Take 50 mg by mouth daily.     No current facility-administered medications for this visit.    PHYSICAL EXAM There were no vitals filed for this visit.  Elderly obese woman in no acute distress.  In a wheelchair Regular rate and rhythm Unlabored breathing No palpable pedal pulses Unstageable right posterior heel ulcer over the calcaneus and Achilles.  The wound is a little bigger than a quarter.  There is a necrotic eschar.   PERTINENT LABORATORY AND RADIOLOGIC DATA  Most recent CBC    Latest Ref Rng & Units 01/26/2023    8:30 AM 01/03/2023    5:15 AM 01/02/2023    5:04 AM  CBC  WBC 4.0 - 10.5 K/uL 7.6  6.0  7.9   Hemoglobin 12.0 - 15.0 g/dL 10.9  10.3  9.7   Hematocrit 36.0 - 46.0 % 35.7  32.3  30.8   Platelets 150 - 400 K/uL 173  224  222      Most recent CMP    Latest Ref Rng & Units 01/26/2023    8:30 AM 01/03/2023    5:15 AM 01/02/2023    5:04 AM  CMP  Glucose 70 - 99 mg/dL 92  157  123   BUN 8 - 23 mg/dL 30  17  21   Creatinine 0.44 - 1.00 mg/dL 1.26  0.91  0.96   Sodium 135 - 145 mmol/L 135  134  135   Potassium 3.5 - 5.1 mmol/L 4.4  3.8  4.3   Chloride 98 - 111 mmol/L 105  103  103   CO2 22 - 32 mmol/L 19  23  24   Calcium 8.9 - 10.3 mg/dL 8.6  8.5  8.5     Renal function CrCl cannot be calculated (Unknown ideal weight.).  Hemoglobin A1C (no units)  Date Value  08/17/2022 8.7    LDL Chol Calc (NIH)  Date Value Ref Range Status  12/18/2021 86 0 - 99 mg/dL Final   Direct LDL  Date Value  Ref Range Status  08/06/2020 81.0 mg/dL Final    Comment:    Optimal:  <100 mg/dLNear or Above Optimal:  100-129 mg/dLBorderline High:  130-159 mg/dLHigh:  160-189 mg/dLVery High:  >190 mg/dL    +-------+-----------+-----------+------------+------------+  ABI/TBIToday's ABIToday's TBIPrevious ABIPrevious TBI  +-------+-----------+-----------+------------+------------+  Right 1.04       0.54       1.01        0.62          +-------+-----------+-----------+------------+------------+  Left  1.26       0.39       1.06        0.83          +-------+-----------+-----------+------------+------------+   Caster Fayette N. Eilleen Davoli, MD FACS [facility] [place] Office Phone Number: [phone] 02/07/2023 11:45 AM   Total time spent on preparing this encounter including chart review, data review, collecting history, examining the patient, coordinating care for this new patient, 60 minutes.  Portions of this report may have been transcribed using voice recognition software.  Every effort has been made to ensure accuracy; however, inadvertent computerized transcription errors may still be present.    

## 2023-02-07 NOTE — Progress Notes (Unsigned)
[facility]   ASSESSMENT / PLAN: Tina Hester is a 82 y.o. female with atherosclerosis of native arteries of right lower extremity causing heel ulceration.  Patient counseled patients with chronic limb threatening ischemia have an annual risk of cardiovascular mortality of 25% and a high risk of amputation.   WIfI score calculated based on clinical exam and non-invasive measurements. 3 / 0 / 0, Stage III.  Recommend the following which can slow the progression of atherosclerosis and reduce the risk of major adverse cardiac / limb events:  Complete cessation from all tobacco products. Blood glucose control with goal A1c < 7%. Blood pressure control with goal blood pressure < 140/90 mmHg. Lipid reduction therapy with goal LDL-C <100 mg/dL (<40 if symptomatic from PAD).  Aspirin 81mg  PO QD.  Atorvastatin 40-80mg  PO QD (or other "high intensity" statin therapy).  Plan right lower extremity angiogram with possible intervention via left femoral approach in cath lab as schedule allows.  CHIEF COMPLAINT: right heel ulcer  HISTORY OF PRESENT ILLNESS: Tina Hester is a 82 y.o. female who presents to clinic for evaluation of right heel unstageable ulceration.  This has been present for several months.  The patient has neuropathy and no feeling in her feet.  This happened during an episode of lower extremity edema.  Her sister applied an elevating slipper which wore an ulcer on her right heel.  This is not healed since.  She does not walk enough to claudicate, but does transfer plan is able to do some activities of daily living.   Past Medical History:  Diagnosis Date   Anemia    s/p Heme work -up normal EGD and colonscopy in 2012 per pt,neg SPEP   Anxiety    Aortic stenosis    Mild by echo 12/2021 with mean aortic valve gradient 17 mmHg   Bilateral carotid artery stenosis 06/08/2015   1-39% bilateral   Bradycardia    CKD (chronic kidney disease),  stage III (HCC)    Diabetes mellitus without complication (HCC)    type 2   GERD (gastroesophageal reflux disease)    Gout    HSV-1 (herpes simplex virus 1) infection    Acyclovir prn   Hyperkalemia    Hyperlipidemia    Hypertension    Joint pain    osteoarthritis by Xray- possible erosion of R 4th MCP,elevated uric  acid ,ANA +    MCI (mild cognitive impairment)    Morbid obesity (HCC)    OSA on CPAP    Osteopenia    PAF (paroxysmal atrial fibrillation) (HCC)    Pain management    Neurosurg: Dr Odette Fraction   Pulmonary HTN (HCC)    Moderate by echo  2016. PASP   TIA (transient ischemic attack)    remote history of TIA's early 2000    Past Surgical History:  Procedure Laterality Date   ABDOMINAL HYSTERECTOMY     APPENDECTOMY     BACK SURGERY     cervical fusion   BLADDER SURGERY     CESAREAN SECTION     CHOLECYSTECTOMY      Family History  Problem Relation Age of Onset   Arrhythmia Brother     Social History   Socioeconomic History   Marital status: Divorced    Spouse name: Not on file   Number of children: 4   Years of education: Not on file   Highest education level: Some college, no degree  Occupational History  Not on file  Tobacco Use   Smoking status: Never   Smokeless tobacco: Never  Vaping Use   Vaping Use: Never used  Substance and Sexual Activity   Alcohol use: No   Drug use: No   Sexual activity: Not on file  Other Topics Concern   Not on file  Social History Narrative   10/27/21 lives with daughter, [name]   Some soda   Social Determinants of Health   Financial Resource Strain: Not on file  Food Insecurity: No Food Insecurity (12/30/2022)   Hunger Vital Sign    Worried About Running Out of Food in the Last Year: Never true    Ran Out of Food in the Last Year: Never true  Transportation Needs: No Transportation Needs (12/30/2022)   PRAPARE - Administrator, Civil Service (Medical): No    Lack of Transportation  (Non-Medical): No  Physical Activity: Not on file  Stress: Not on file  Social Connections: Not on file  Intimate Partner Violence: Not At Risk (12/30/2022)   Humiliation, Afraid, Rape, and Kick questionnaire    Fear of Current or Ex-Partner: No    Emotionally Abused: No    Physically Abused: No    Sexually Abused: No    Allergies  Allergen Reactions   Pneumococcal Vaccine Anaphylaxis    weakness   Pneumovax 23 [Pneumococcal Vac Polyvalent] Anaphylaxis    weakness   Other Hives    Other reaction(s): Other (See Comments) Pneumonia  Vaccine- very ill   Sulfa Antibiotics Hives   Pregabalin     Other reaction(s): depression    Current Outpatient Medications  Medication Sig Dispense Refill   acetaminophen (TYLENOL) 325 MG tablet Take 500 mg by mouth every 4 (four) hours as needed for mild pain or moderate pain.     ALPRAZolam (XANAX) 0.25 MG tablet Take 1 tablet (0.25 mg total) by mouth at bedtime as needed for anxiety. 10 tablet 0   amLODipine (NORVASC) 5 MG tablet TAKE ONE TABLET BY MOUTH EVERY MORNING 90 tablet 3   atorvastatin (LIPITOR) 40 MG tablet TAKE ONE TABLET BY MOUTH EVERY EVENING 90 tablet 3   donepezil (ARICEPT) 5 MG tablet Take 5 mg by mouth daily.     dronedarone (MULTAQ) 400 MG tablet      ELIQUIS 5 MG TABS tablet TAKE ONE TABLET BY MOUTH TWICE DAILY 180 tablet 1   ezetimibe (ZETIA) 10 MG tablet Take 10 mg by mouth daily.     glipiZIDE (GLUCOTROL) 10 MG tablet Take 10 mg by mouth daily before breakfast.     insulin glargine (LANTUS SOLOSTAR) 100 UNIT/ML Solostar Pen Inject 15 Units into the skin daily.     insulin lispro (HUMALOG) 100 UNIT/ML injection Inject 1-7 Units into the skin 3 (three) times daily before meals. Sliding scale 141-180=1 unit,181-220= 2 units,221-260= 3 units,261-300=4 units, 301-340=5 units, 341-380=6 units, 381-400 = 7 units,> 400 call MD     Insulin NPH, Human,, Isophane, (NOVOLIN N FLEXPEN) 100 UNIT/ML Kiwkpen Inject 15 units at morning and 18  units at night 15 mL 2   metFORMIN (GLUCOPHAGE) 500 MG tablet TAKE ONE TABLET BY MOUTH EVERY MORNING and TAKE TWO TABLETS BY MOUTH EVERY EVENING 270 tablet 0   metoprolol tartrate (LOPRESSOR) 50 MG tablet Take 1 tablet (50 mg total) by mouth 2 (two) times daily. 180 tablet 3   olmesartan (BENICAR) 20 MG tablet Take 20 mg by mouth daily.     pioglitazone (ACTOS) 15 MG tablet  Take 1 tablet (15 mg total) by mouth every morning. 90 tablet 0   sertraline (ZOLOFT) 50 MG tablet Take 50 mg by mouth daily.     No current facility-administered medications for this visit.    PHYSICAL EXAM There were no vitals filed for this visit.  Elderly obese woman in no acute distress.  In a wheelchair Regular rate and rhythm Unlabored breathing No palpable pedal pulses Unstageable right posterior heel ulcer over the calcaneus and Achilles.  The wound is a little bigger than a quarter.  There is a necrotic eschar.   PERTINENT LABORATORY AND RADIOLOGIC DATA  Most recent CBC    Latest Ref Rng & Units 01/26/2023    8:30 AM 01/03/2023    5:15 AM 01/02/2023    5:04 AM  CBC  WBC 4.0 - 10.5 K/uL 7.6  6.0  7.9   Hemoglobin 12.0 - 15.0 g/dL 16.1  09.6  9.7   Hematocrit 36.0 - 46.0 % 35.7  32.3  30.8   Platelets 150 - 400 K/uL 173  224  222      Most recent CMP    Latest Ref Rng & Units 01/26/2023    8:30 AM 01/03/2023    5:15 AM 01/02/2023    5:04 AM  CMP  Glucose 70 - 99 mg/dL 92  045  409   BUN 8 - 23 mg/dL 30  17  21    Creatinine 0.44 - 1.00 mg/dL 8.11  9.14  7.82   Sodium 135 - 145 mmol/L 135  134  135   Potassium 3.5 - 5.1 mmol/L 4.4  3.8  4.3   Chloride 98 - 111 mmol/L 105  103  103   CO2 22 - 32 mmol/L 19  23  24    Calcium 8.9 - 10.3 mg/dL 8.6  8.5  8.5     Renal function CrCl cannot be calculated (Unknown ideal weight.).  Hemoglobin A1C (no units)  Date Value  08/17/2022 8.7    LDL Chol Calc (NIH)  Date Value Ref Range Status  12/18/2021 86 0 - 99 mg/dL Final   Direct LDL  Date Value  Ref Range Status  08/06/2020 81.0 mg/dL Final    Comment:    Optimal:  <100 mg/dLNear or Above Optimal:  100-129 mg/dLBorderline High:  130-159 mg/dLHigh:  160-189 mg/dLVery High:  >190 mg/dL    +-------+-----------+-----------+------------+------------+  ABI/TBIToday's ABIToday's TBIPrevious ABIPrevious TBI  +-------+-----------+-----------+------------+------------+  Right 1.04       0.54       1.01        0.62          +-------+-----------+-----------+------------+------------+  Left  1.26       0.39       1.06        0.83          +-------+-----------+-----------+------------+------------+   Rande Brunt. Lenell Antu, MD FACS [facility] Phone Number: [phone] 02/07/2023 11:45 AM   Total time spent on preparing this encounter including chart review, data review, collecting history, examining the patient, coordinating care for this new patient, 60 minutes.  Portions of this report may have been transcribed using voice recognition software.  Every effort has been made to ensure accuracy; however, inadvertent computerized transcription errors may still be present.

## 2023-02-08 ENCOUNTER — Encounter: Payer: Self-pay | Admitting: Vascular Surgery

## 2023-02-08 ENCOUNTER — Ambulatory Visit (INDEPENDENT_AMBULATORY_CARE_PROVIDER_SITE_OTHER): Payer: PPO | Admitting: Vascular Surgery

## 2023-02-08 ENCOUNTER — Ambulatory Visit (HOSPITAL_COMMUNITY)
Admission: RE | Admit: 2023-02-08 | Discharge: 2023-02-08 | Disposition: A | Discharge status: Home / Self Care | Payer: PPO | Source: Ambulatory Visit | Attending: Vascular Surgery | Admitting: Vascular Surgery

## 2023-02-08 VITALS — BP 144/68 | HR 68 | Temp 97.9°F | Resp 20 | Ht 61.0 in | Wt 206.0 lb

## 2023-02-08 DIAGNOSIS — I70234 Atherosclerosis of native arteries of right leg with ulceration of heel and midfoot: Secondary | ICD-10-CM | POA: Diagnosis not present

## 2023-02-08 DIAGNOSIS — L97419 Non-pressure chronic ulcer of right heel and midfoot with unspecified severity: Secondary | ICD-10-CM

## 2023-02-08 LAB — VAS US ABI WITH/WO TBI
Left ABI: 1.26
Right ABI: 1.04

## 2023-02-09 ENCOUNTER — Encounter (HOSPITAL_BASED_OUTPATIENT_CLINIC_OR_DEPARTMENT_OTHER): Payer: PPO | Admitting: General Surgery

## 2023-02-09 ENCOUNTER — Other Ambulatory Visit: Payer: Self-pay | Admitting: *Deleted

## 2023-02-09 ENCOUNTER — Encounter (HOSPITAL_BASED_OUTPATIENT_CLINIC_OR_DEPARTMENT_OTHER): Payer: PPO | Attending: General Surgery | Admitting: General Surgery

## 2023-02-09 ENCOUNTER — Encounter: Payer: Self-pay | Admitting: *Deleted

## 2023-02-09 DIAGNOSIS — I70234 Atherosclerosis of native arteries of right leg with ulceration of heel and midfoot: Secondary | ICD-10-CM

## 2023-02-11 ENCOUNTER — Telehealth: Payer: Self-pay

## 2023-02-11 ENCOUNTER — Telehealth: Payer: Self-pay | Admitting: *Deleted

## 2023-02-11 NOTE — Telephone Encounter (Signed)
Caroline with [facility] called to confirm pt is weight bearing as tolerated. She is going to call podiatry as well, to confirm this with them as well, as pt has a foot ulcer and was unable to make her initial appt this week with wound clinic.

## 2023-02-11 NOTE — Telephone Encounter (Signed)
Received call from [facility] with [facility]) requesting call back on status on weight bearing.  She wanting to know if the patient is weigh bearing as tolerated,and should PT be held until patient is seen by Wound clinic.  Can reach [place] at [phone])//AB/CMA

## 2023-02-18 DIAGNOSIS — I700 Atherosclerosis of aorta: Secondary | ICD-10-CM | POA: Diagnosis not present

## 2023-02-18 DIAGNOSIS — F331 Major depressive disorder, recurrent, moderate: Secondary | ICD-10-CM | POA: Diagnosis not present

## 2023-02-18 DIAGNOSIS — E11621 Type 2 diabetes mellitus with foot ulcer: Secondary | ICD-10-CM | POA: Diagnosis not present

## 2023-02-18 DIAGNOSIS — Z993 Dependence on wheelchair: Secondary | ICD-10-CM | POA: Diagnosis not present

## 2023-02-18 DIAGNOSIS — Z794 Long term (current) use of insulin: Secondary | ICD-10-CM | POA: Diagnosis not present

## 2023-02-18 DIAGNOSIS — L89602 Pressure ulcer of unspecified heel, stage 2: Secondary | ICD-10-CM | POA: Diagnosis not present

## 2023-02-18 DIAGNOSIS — I5042 Chronic combined systolic (congestive) and diastolic (congestive) heart failure: Secondary | ICD-10-CM | POA: Diagnosis not present

## 2023-02-18 DIAGNOSIS — N1832 Chronic kidney disease, stage 3b: Secondary | ICD-10-CM | POA: Diagnosis not present

## 2023-02-18 DIAGNOSIS — E1122 Type 2 diabetes mellitus with diabetic chronic kidney disease: Secondary | ICD-10-CM | POA: Diagnosis not present

## 2023-02-23 ENCOUNTER — Telehealth: Payer: Self-pay

## 2023-02-23 NOTE — Telephone Encounter (Signed)
Spoke to [place], pt's daughter regarding pt taking Eliquis yesterday. Pt has not taken it today and will remain off of it until her procedure later this week. MD is aware. No further questions/concerns at this time.

## 2023-02-25 ENCOUNTER — Ambulatory Visit (HOSPITAL_COMMUNITY)
Admission: RE | Admit: 2023-02-25 | Discharge: 2023-02-25 | Disposition: A | Discharge status: Home / Self Care | Payer: PPO | Attending: Vascular Surgery | Admitting: Vascular Surgery

## 2023-02-25 ENCOUNTER — Encounter (HOSPITAL_COMMUNITY)
Admission: RE | Disposition: A | Discharge status: Home / Self Care | Payer: Self-pay | Source: Home / Self Care | Attending: Vascular Surgery

## 2023-02-25 ENCOUNTER — Telehealth: Payer: Self-pay | Admitting: Vascular Surgery

## 2023-02-25 DIAGNOSIS — N183 Chronic kidney disease, stage 3 unspecified: Secondary | ICD-10-CM | POA: Diagnosis not present

## 2023-02-25 DIAGNOSIS — E1140 Type 2 diabetes mellitus with diabetic neuropathy, unspecified: Secondary | ICD-10-CM | POA: Insufficient documentation

## 2023-02-25 DIAGNOSIS — Z794 Long term (current) use of insulin: Secondary | ICD-10-CM | POA: Insufficient documentation

## 2023-02-25 DIAGNOSIS — E1151 Type 2 diabetes mellitus with diabetic peripheral angiopathy without gangrene: Secondary | ICD-10-CM | POA: Diagnosis not present

## 2023-02-25 DIAGNOSIS — L97419 Non-pressure chronic ulcer of right heel and midfoot with unspecified severity: Secondary | ICD-10-CM | POA: Diagnosis not present

## 2023-02-25 DIAGNOSIS — Z7984 Long term (current) use of oral hypoglycemic drugs: Secondary | ICD-10-CM | POA: Insufficient documentation

## 2023-02-25 DIAGNOSIS — I70234 Atherosclerosis of native arteries of right leg with ulceration of heel and midfoot: Secondary | ICD-10-CM | POA: Diagnosis not present

## 2023-02-25 DIAGNOSIS — E1122 Type 2 diabetes mellitus with diabetic chronic kidney disease: Secondary | ICD-10-CM | POA: Diagnosis not present

## 2023-02-25 DIAGNOSIS — I129 Hypertensive chronic kidney disease with stage 1 through stage 4 chronic kidney disease, or unspecified chronic kidney disease: Secondary | ICD-10-CM | POA: Insufficient documentation

## 2023-02-25 HISTORY — PX: LOWER EXTREMITY ANGIOGRAPHY: CATH118251

## 2023-02-25 HISTORY — PX: PERIPHERAL VASCULAR INTERVENTION: CATH118257

## 2023-02-25 LAB — POCT I-STAT, CHEM 8
BUN: 33 mg/dL — ABNORMAL HIGH (ref 8–23)
Calcium, Ion: 1.17 mmol/L (ref 1.15–1.40)
Chloride: 102 mmol/L (ref 98–111)
Creatinine, Ser: 1.3 mg/dL — ABNORMAL HIGH (ref 0.44–1.00)
Glucose, Bld: 165 mg/dL — ABNORMAL HIGH (ref 70–99)
HCT: 36 % (ref 36.0–46.0)
Hemoglobin: 12.2 g/dL (ref 12.0–15.0)
Potassium: 4.4 mmol/L (ref 3.5–5.1)
Sodium: 136 mmol/L (ref 135–145)
TCO2: 25 mmol/L (ref 22–32)

## 2023-02-25 LAB — GLUCOSE, CAPILLARY: Glucose-Capillary: 157 mg/dL — ABNORMAL HIGH (ref 70–99)

## 2023-02-25 LAB — POCT ACTIVATED CLOTTING TIME: Activated Clotting Time: 146 seconds

## 2023-02-25 SURGERY — LOWER EXTREMITY ANGIOGRAPHY
Anesthesia: LOCAL

## 2023-02-25 MED ORDER — LIDOCAINE HCL (PF) 1 % IJ SOLN
INTRAMUSCULAR | Status: AC
  Filled 2023-02-25: qty 30

## 2023-02-25 MED ORDER — LIDOCAINE HCL (PF) 1 % IJ SOLN
INTRAMUSCULAR | Status: DC | PRN
  Administered 2023-02-25 (×2): 20 mL

## 2023-02-25 MED ORDER — HEPARIN SODIUM (PORCINE) 1000 UNIT/ML IJ SOLN
INTRAMUSCULAR | Status: DC | PRN
  Administered 2023-02-25: 10000 [IU] via INTRAVENOUS

## 2023-02-25 MED ORDER — SODIUM CHLORIDE 0.9% FLUSH: 3.0000 mL | INTRAVENOUS | Status: DC | PRN

## 2023-02-25 MED ORDER — FENTANYL CITRATE (PF) 100 MCG/2ML IJ SOLN
INTRAMUSCULAR | Status: AC
  Filled 2023-02-25: qty 2

## 2023-02-25 MED ORDER — SODIUM CHLORIDE 0.9% FLUSH: 3.0000 mL | Freq: Two times a day (BID) | INTRAVENOUS | Status: DC

## 2023-02-25 MED ORDER — ACETAMINOPHEN 325 MG PO TABS
650.0000 mg | ORAL_TABLET | ORAL | Status: DC | PRN
  Administered 2023-02-25: 650 mg via ORAL
  Filled 2023-02-25: qty 2

## 2023-02-25 MED ORDER — PROTAMINE SULFATE 10 MG/ML IV SOLN
INTRAVENOUS | Status: DC | PRN
  Administered 2023-02-25: 45 mg via INTRAVENOUS
  Administered 2023-02-25: 5 mg via INTRAVENOUS

## 2023-02-25 MED ORDER — LABETALOL HCL 5 MG/ML IV SOLN: 10.0000 mg | INTRAVENOUS | Status: DC | PRN

## 2023-02-25 MED ORDER — ASPIRIN 81 MG PO TBEC: 81.0000 mg | DELAYED_RELEASE_TABLET | Freq: Every day | ORAL | Status: DC

## 2023-02-25 MED ORDER — SODIUM CHLORIDE 0.9 % IV SOLN: 250.0000 mL | INTRAVENOUS | Status: DC | PRN

## 2023-02-25 MED ORDER — CLOPIDOGREL BISULFATE 300 MG PO TABS
ORAL_TABLET | ORAL | Status: DC | PRN
  Administered 2023-02-25: 300 mg via ORAL

## 2023-02-25 MED ORDER — ASPIRIN 81 MG PO CHEW
CHEWABLE_TABLET | ORAL | Status: DC | PRN
  Administered 2023-02-25: 81 mg via ORAL

## 2023-02-25 MED ORDER — FENTANYL CITRATE (PF) 100 MCG/2ML IJ SOLN
25.0000 ug | Freq: Once | INTRAMUSCULAR | Status: AC
  Administered 2023-02-25: 25 ug via INTRAVENOUS

## 2023-02-25 MED ORDER — CLOPIDOGREL BISULFATE 300 MG PO TABS
ORAL_TABLET | ORAL | Status: AC
  Filled 2023-02-25: qty 1

## 2023-02-25 MED ORDER — HEPARIN (PORCINE) IN NACL 1000-0.9 UT/500ML-% IV SOLN
INTRAVENOUS | Status: DC | PRN
  Administered 2023-02-25 (×2): 500 mL

## 2023-02-25 MED ORDER — ASPIRIN 81 MG PO TBEC: 81.0000 mg | DELAYED_RELEASE_TABLET | Freq: Every day | ORAL | 2 refills | Status: DC

## 2023-02-25 MED ORDER — FENTANYL CITRATE (PF) 100 MCG/2ML IJ SOLN
INTRAMUSCULAR | Status: DC | PRN
  Administered 2023-02-25 (×3): 25 ug via INTRAVENOUS

## 2023-02-25 MED ORDER — ONDANSETRON HCL 4 MG/2ML IJ SOLN: 4.0000 mg | Freq: Four times a day (QID) | INTRAMUSCULAR | Status: DC | PRN

## 2023-02-25 MED ORDER — MIDAZOLAM HCL 2 MG/2ML IJ SOLN
INTRAMUSCULAR | Status: AC
  Filled 2023-02-25: qty 2

## 2023-02-25 MED ORDER — HYDRALAZINE HCL 20 MG/ML IJ SOLN: 5.0000 mg | INTRAMUSCULAR | Status: DC | PRN

## 2023-02-25 MED ORDER — MIDAZOLAM HCL 2 MG/2ML IJ SOLN
INTRAMUSCULAR | Status: DC | PRN
  Administered 2023-02-25 (×2): .5 mg via INTRAVENOUS

## 2023-02-25 MED ORDER — IODIXANOL 320 MG/ML IV SOLN
INTRAVENOUS | Status: DC | PRN
  Administered 2023-02-25: 105 mL

## 2023-02-25 MED ORDER — PROTAMINE SULFATE 10 MG/ML IV SOLN
INTRAVENOUS | Status: AC
  Filled 2023-02-25: qty 5

## 2023-02-25 MED ORDER — ASPIRIN 81 MG PO CHEW
CHEWABLE_TABLET | ORAL | Status: AC
  Filled 2023-02-25: qty 1

## 2023-02-25 MED ORDER — CLOPIDOGREL BISULFATE 75 MG PO TABS
300.0000 mg | ORAL_TABLET | Freq: Once | ORAL | Status: DC
Start: 2023-02-25 — End: 2023-02-25

## 2023-02-25 MED ORDER — SODIUM CHLORIDE 0.9 % IV SOLN: INTRAVENOUS | Status: AC

## 2023-02-25 MED ORDER — SODIUM CHLORIDE 0.9 % IV SOLN: INTRAVENOUS | Status: DC

## 2023-02-25 SURGICAL SUPPLY — 28 items
BALLN MUSTANG 5X150X135 (BALLOONS) ×2
BALLOON MUSTANG 5X150X135 (BALLOONS) IMPLANT
CATH OMNI FLUSH 5F 65CM (CATHETERS) IMPLANT
CATH QUICKCROSS .035X135CM (MICROCATHETER) IMPLANT
COVER DOME SNAP 22 D (MISCELLANEOUS) IMPLANT
DEVICE CONTINUOUS FLUSH (MISCELLANEOUS) IMPLANT
GLIDEWIRE ADV .035X260CM (WIRE) IMPLANT
GUIDEWIRE ANGLED .035X150CM (WIRE) IMPLANT
KIT ENCORE 26 ADVANTAGE (KITS) IMPLANT
KIT MICROPUNCTURE NIT STIFF (SHEATH) IMPLANT
KIT PV (KITS) ×2 IMPLANT
PINNACLE LONG 5F 25CM (SHEATH) ×2
PINNACLE LONG 6F 25CM (SHEATH) ×2
SHEATH CATAPULT 6F 90 MP (SHEATH) IMPLANT
SHEATH CATAPULT 6FR 45 (SHEATH) IMPLANT
SHEATH INTRO PINNACLE 6F 25CM (SHEATH) IMPLANT
SHEATH INTROD PINNACLE 5F 25CM (SHEATH) IMPLANT
SHEATH PINNACLE 5F 10CM (SHEATH) IMPLANT
SHEATH PROBE COVER 6X72 (BAG) IMPLANT
STENT ELUVIA 6X150X130 (Permanent Stent) IMPLANT
STOPCOCK MORSE 400PSI 3WAY (MISCELLANEOUS) IMPLANT
SYR MEDRAD MARK 7 150ML (SYRINGE) ×2 IMPLANT
TRANSDUCER W/STOPCOCK (MISCELLANEOUS) ×2 IMPLANT
TRAY PV CATH (CUSTOM PROCEDURE TRAY) ×2 IMPLANT
TUBING CIL FLEX 10 FLL-RA (TUBING) IMPLANT
WIRE BENTSON .035X145CM (WIRE) IMPLANT
WIRE MICRO SET 5FR 12 (WIRE) IMPLANT
WIRE TORQFLEX AUST .018X40CM (WIRE) IMPLANT

## 2023-02-25 NOTE — Telephone Encounter (Signed)
Pt currently still in the hospital

## 2023-02-25 NOTE — Telephone Encounter (Signed)
-----   Message from Leonie Douglas, MD sent at 02/25/2023  9:21 AM EDT ----- Tina Hester 02/25/2023 Procedure:  1) Ultrasound guided left common femoral artery access 2) Aortogram 3) Right lower extremity angiogram with third order cannulation ( total contrast) 4) Additional right lower extremity angiogram 5) Right femoropopliteal angioplasty and stenting (6x173mm Eluvia) 6) Conscious sedation (38 minutes)  Assistant: none Follow up: 4 weeks with me Studies for follow up: ABI / RLE duplex  Thank you! Elijah Birk

## 2023-02-25 NOTE — Progress Notes (Signed)
40fr long sheath aspirated and removed from left femoral artery. Manual pressure applied for 30 minutes. Site level 0. Some bruising from my fingertips present. No s+s of hematoma, tegaderm dressing applied, bedrest instructions given.   Left dp and pt present with doppler, Right dp present with doppler.    Bedrest begins at 10:45:00

## 2023-02-25 NOTE — Op Note (Signed)
DATE OF SERVICE: 02/25/2023  PATIENT:  Tina Hester  82 y.o. female  PRE-OPERATIVE DIAGNOSIS:  Atherosclerosis of native arteries of right lower extremity lower extremity causing heel ulceration  POST-OPERATIVE DIAGNOSIS:  Same  PROCEDURE:   1) Ultrasound guided left common femoral artery access 2) Aortogram 3) Right lower extremity angiogram with third order cannulation ( total contrast) 4) Additional right lower extremity angiogram 5) Right femoropopliteal angioplasty and stenting (6x136mm Eluvia) 6) Conscious sedation (38 minutes)   SURGEON:  Rande Brunt. Lenell Antu, MD  ASSISTANT: none  ANESTHESIA:   local and IV sedation  ESTIMATED BLOOD LOSS: minimal  LOCAL MEDICATIONS USED:  LIDOCAINE   COUNTS: confirmed correct.  PATIENT DISPOSITION:  PACU - hemodynamically stable.   Delay start of Pharmacological VTE agent (>24hrs) due to surgical blood loss or risk of bleeding: no  INDICATION FOR PROCEDURE: Tina Hester is a 82 y.o. female with right heel ulceration and non-invasive evidence of peripheral arterial disease. After careful discussion of risks, benefits, and alternatives the patient was offered angiography. The patient's family understood and wished to proceed.  OPERATIVE FINDINGS:  SMA patent Renal arteries patent Angulated and small terminal aorta / bifurcation Iliac vessels patent Left common femoral artery immediately below access heavily diseased  Right lower extremity: Common femoral artery:  patent Profunda femoris artery: patent  Superficial femoral artery: patent Popliteal artery: focal occlusion above the knee; severe stenosis (>75%) behind the knee; patent below the knee Anterior tibial artery: occluded Tibioperoneal trunk: patent Peroneal artery: proximal (40%) stenosis; patent to the ankle Posterior tibial artery: proximal stenosis (30%) which is not flow limiting; dominant tibial to the foot; fills the plantar arch;  Pedal circulation:  fills via PT; mildly diseased  DESCRIPTION OF PROCEDURE: After identification of the patient in the pre-operative holding area, the patient was transferred to the operating room. The patient was positioned supine on the operating room table. Anesthesia was induced. The groins was prepped and draped in standard fashion. A surgical pause was performed confirming correct patient, procedure, and operative location.  The left groin was anesthetized with subcutaneous injection of 1% lidocaine. Using ultrasound guidance, the left common femoral artery was accessed with micropuncture technique. Fluoroscopy was used to confirm cannulation over the femoral head. The 38F sheath was upsized to 721F.   A Benson wire was advanced into the distal aorta. Over the wire an omni flush catheter was advanced to the level of L2. Aortogram was performed - see above for details.   The right common iliac artery was selected with an omniflush catheter and glidewire guidewire. The wire was advanced into the common femoral artery. Over the wire the omni flush catheter was advanced into the external iliac artery. Selective angiography was performed - see above for details.   The decision was made to intervene. The patient was heparinized with 10,000 units of heparin. The 721F sheath was exchanged for a 34F x 90cm sheath. Selective angiography of the left lower extremity was performed prior to intervention.   The lesions were treated with: Right femoropopliteal angioplasty and stenting (6x160mm Eluvia)  Completion angiography revealed:  Resolution of above knee popliteal occlusion Behind knee popliteal artery with focal dissection from angioplasty; not flow limiting. Elected to observe this.  Conscious sedation was administered with the use of IV fentanyl and midazolam under continuous physician and nurse monitoring.  Heart rate, blood pressure, and oxygen saturation were continuously monitored.  Total sedation time was 38  minutes  Upon completion of the case instrument  and sharps counts were confirmed correct. The patient was transferred to the PACU in good condition. I was present for all portions of the procedure.  PLAN: ASA 81mg  PO every day. Plavix 300mg  X1 today. Resume Eliquis tomorrow. High intensity statin therapy.  Rande Brunt. Lenell Antu, MD [facility] Phone Number: [phone] 02/25/2023 9:11 AM

## 2023-02-25 NOTE — Interval H&P Note (Signed)
History and Physical Interval Note:  02/25/2023 7:41 AM  Tina Hester  has presented today for surgery, with the diagnosis of atherosclerosis of native arteries of the right lower extremitites with ulceration of heel.  The various methods of treatment have been discussed with the patient and family. After consideration of risks, benefits and other options for treatment, the patient has consented to  Procedure(s): Lower Extremity Angiography (N/A) as a surgical intervention.  The patient's history has been reviewed, patient examined, no change in status, stable for surgery.  I have reviewed the patient's chart and labs.  Questions were answered to the patient's satisfaction.     Leonie Douglas

## 2023-02-25 NOTE — Progress Notes (Signed)
Pt ambulated in hall ~100 feet with no signs of oozing from left groin site

## 2023-02-28 ENCOUNTER — Encounter (HOSPITAL_COMMUNITY): Payer: Self-pay | Admitting: Vascular Surgery

## 2023-03-01 LAB — POCT ACTIVATED CLOTTING TIME: Activated Clotting Time: 287 seconds

## 2023-03-08 ENCOUNTER — Encounter (HOSPITAL_BASED_OUTPATIENT_CLINIC_OR_DEPARTMENT_OTHER): Payer: PPO | Attending: Internal Medicine | Admitting: Internal Medicine

## 2023-03-08 DIAGNOSIS — E1151 Type 2 diabetes mellitus with diabetic peripheral angiopathy without gangrene: Secondary | ICD-10-CM | POA: Diagnosis not present

## 2023-03-08 DIAGNOSIS — K219 Gastro-esophageal reflux disease without esophagitis: Secondary | ICD-10-CM | POA: Insufficient documentation

## 2023-03-08 DIAGNOSIS — N183 Chronic kidney disease, stage 3 unspecified: Secondary | ICD-10-CM | POA: Diagnosis not present

## 2023-03-08 DIAGNOSIS — I4891 Unspecified atrial fibrillation: Secondary | ICD-10-CM | POA: Diagnosis not present

## 2023-03-08 DIAGNOSIS — Z09 Encounter for follow-up examination after completed treatment for conditions other than malignant neoplasm: Secondary | ICD-10-CM | POA: Insufficient documentation

## 2023-03-08 DIAGNOSIS — M109 Gout, unspecified: Secondary | ICD-10-CM | POA: Insufficient documentation

## 2023-03-08 DIAGNOSIS — I129 Hypertensive chronic kidney disease with stage 1 through stage 4 chronic kidney disease, or unspecified chronic kidney disease: Secondary | ICD-10-CM | POA: Diagnosis not present

## 2023-03-08 DIAGNOSIS — Z9049 Acquired absence of other specified parts of digestive tract: Secondary | ICD-10-CM | POA: Diagnosis not present

## 2023-03-08 DIAGNOSIS — E11621 Type 2 diabetes mellitus with foot ulcer: Secondary | ICD-10-CM | POA: Diagnosis not present

## 2023-03-08 DIAGNOSIS — Z794 Long term (current) use of insulin: Secondary | ICD-10-CM | POA: Diagnosis not present

## 2023-03-08 DIAGNOSIS — Z8673 Personal history of transient ischemic attack (TIA), and cerebral infarction without residual deficits: Secondary | ICD-10-CM | POA: Diagnosis not present

## 2023-03-08 DIAGNOSIS — E1122 Type 2 diabetes mellitus with diabetic chronic kidney disease: Secondary | ICD-10-CM | POA: Diagnosis not present

## 2023-03-08 DIAGNOSIS — I739 Peripheral vascular disease, unspecified: Secondary | ICD-10-CM

## 2023-03-08 DIAGNOSIS — L89610 Pressure ulcer of right heel, unstageable: Secondary | ICD-10-CM | POA: Insufficient documentation

## 2023-03-08 DIAGNOSIS — G4730 Sleep apnea, unspecified: Secondary | ICD-10-CM | POA: Diagnosis not present

## 2023-03-08 DIAGNOSIS — I272 Pulmonary hypertension, unspecified: Secondary | ICD-10-CM | POA: Diagnosis not present

## 2023-03-08 DIAGNOSIS — Z7901 Long term (current) use of anticoagulants: Secondary | ICD-10-CM | POA: Diagnosis not present

## 2023-03-08 DIAGNOSIS — Z6837 Body mass index (BMI) 37.0-37.9, adult: Secondary | ICD-10-CM | POA: Insufficient documentation

## 2023-03-09 NOTE — Progress Notes (Signed)
FRAYA, UEDA ([id number]) [id number] Page 1 of 4 Visit Report for 03/08/2023 Abuse Risk Screen Details Patient Name: Date of Service: Tina Hester 03/08/2023 9:30 A M Medical Record Number: [id number] Patient Account Number: [id number] Date of Birth/Sex: Treating RN: [birth year] (82 y.o. Tina Hester, Tina Hester Primary Care Tina Hester: Tina Hester Other Clinician: Referring Cheria Sadiq: Treating Donevin Sainsbury/Extender: Tina Hester, Tina Hester in Treatment: 0 Abuse Risk Screen Items Answer ABUSE RISK SCREEN: Has anyone close to you tried to hurt or harm you recentlyo No Do you feel uncomfortable with anyone in your familyo No Has anyone forced you do things that you didnt want to doo No Electronic Signature(s) Signed: 03/08/2023 5:26:31 PM By: Tina Stall RN, BSN Entered By: Tina Hester on 03/08/2023 09:59:47 -------------------------------------------------------------------------------- Activities of Daily Living Details Patient Name: Date of Service: Tina Hester 03/08/2023 9:30 A M Medical Record Number: [id number] Patient Account Number: [id number] Date of Birth/Sex: Treating RN: Tina Hester (82 y.o. Tina Hester, Tina Hester Primary Care Labrian Torregrossa: Tina Hester Other Clinician: Referring Nivan Melendrez: Treating Julio Storr/Extender: Tina Hester, Tina Hester in Treatment: 0 Activities of Daily Living Items Answer Activities of Daily Living (Please select one for each item) Drive Automobile Not Able T Medications ake Completely Able Use T elephone Completely Able Care for Appearance Completely Able Use T oilet Completely Able Bath / Shower Need Assistance Dress Self Need Assistance Feed Self Completely Able Walk Need Assistance Get In / Out Bed Completely Able Housework Not Able Prepare Meals Not Able Handle Money Not Able Shop for Self Not Able Electronic Signature(s) Signed: 03/08/2023 5:26:31 PM By: Tina Stall RN,  BSN Entered By: Tina Hester on 03/08/2023 10:00:27 Tobey Bride ([id number]) [id number] Page 2 of 4 -------------------------------------------------------------------------------- Education Screening Details Patient Name: Date of Service: Tina Hester 03/08/2023 9:30 A M Medical Record Number: [id number] Patient Account Number: [id number] Date of Birth/Sex: Treating RN: [birth year] (82 y.o. Tina Hester, Tina Hester Primary Care Marquan Vokes: Tina Hester Other Clinician: Referring Rome Echavarria: Treating Tina Hester/Extender: Tina Hester, Tina Hester in Treatment: 0 Primary Learner Assessed: Patient Learning Preferences/Education Level/Primary Language Learning Preference: Explanation, Demonstration, Printed Material Highest Education Level: College or Above Preferred Language: English Cognitive Barrier Language Barrier: No Translator Needed: No Memory Deficit: No Emotional Barrier: No Cultural/Religious Beliefs Affecting Medical Care: No Physical Barrier Impaired Vision: No Impaired Hearing: No Decreased Hand dexterity: No Knowledge/Comprehension Knowledge Level: High Comprehension Level: High Ability to understand written instructions: High Ability to understand verbal instructions: High Motivation Anxiety Level: Calm Cooperation: Cooperative Education Importance: Acknowledges Need Interest in Health Problems: Asks Questions Perception: Coherent Willingness to Engage in Self-Management High Activities: Readiness to Engage in Self-Management High Activities: Electronic Signature(s) Signed: 03/08/2023 5:26:31 PM By: Tina Stall RN, BSN Entered By: Tina Hester on 03/08/2023 10:00:52 -------------------------------------------------------------------------------- Fall Risk Assessment Details Patient Name: Date of Service: Tina Hester, Tina BERTA N. 03/08/2023 9:30 A M Medical Record Number: [id number] Patient Account Number:  [id number] Date of Birth/Sex: Treating RN: [birth year] (82 y.o. Tina Hester Primary Care Tina Hester: Tina Hester Other Clinician: Referring Tina Hester: Treating Tina Hester/Extender: Tina Hester, Tina Hester in Treatment: 0 Fall Risk Assessment Items Have you had 2 or more falls in the last [address] monthso 0 No [name], DRESS ([id number]) [id number] Page 3 of 4 Have you had any fall that resulted in injury in the last 12 monthso 0 Yes FALLS RISK SCREEN History of falling - immediate or within 3 months 25 Yes Secondary diagnosis (  Do you have 2 or more medical diagnoseso) 0 No Ambulatory aid None/bed rest/wheelchair/nurse 0 Yes Crutches/cane/walker 0 No Furniture 0 No Intravenous therapy Access/Saline/Heparin Lock 0 No Gait/Transferring Normal/ bed rest/ wheelchair 0 No Weak (short steps with or without shuffle, stooped but able to lift head while walking, may seek 10 Yes support from furniture) Impaired (short steps with shuffle, may have difficulty arising from chair, head down, impaired 0 No balance) Mental Status Oriented to own ability 0 Yes Electronic Signature(s) Signed: 03/08/2023 5:26:31 PM By: Tina Stall RN, BSN Entered By: Tina Hester on 03/08/2023 10:01:08 -------------------------------------------------------------------------------- Foot Assessment Details Patient Name: Date of Service: Tina Hester, Tina BERTA N. 03/08/2023 9:30 A M Medical Record Number: [id number] Patient Account Number: [id number] Date of Birth/Sex: Treating RN: [birth year] Hester (82 y.o. Tina Hester Primary Care Naleah Kofoed: Tina Hester Other Clinician: Referring Tina Hester: Treating Tina Hester/Extender: Tina Hester, Tina Hester in Treatment: 0 Foot Assessment Items Site Locations + = Sensation present, - = Sensation absent, C = Callus, U = Ulcer R = Redness, W = Warmth, M = Maceration, PU = Pre-ulcerative lesion F = Fissure, S = Swelling, D =  Dryness Assessment Right: Left: Other Deformity: No No Prior Foot Ulcer: No No Prior Amputation: No No Charcot Joint: No No Ambulatory Status: Ambulatory With Help Assistance Device: Tina Hester ([id number]) [id number] Page 4 of 4 Gait: Steady Electronic Signature(s) Signed: 03/08/2023 5:26:31 PM By: Tina Stall RN, BSN Entered By: Tina Hester on 03/08/2023 10:16:34 -------------------------------------------------------------------------------- Nutrition Risk Screening Details Patient Name: Date of Service: Tina Cash N. 03/08/2023 9:30 A M Medical Record Number: [id number] Patient Account Number: [id number] Date of Birth/Sex: Treating RN: [birth year] (82 y.o. Tina Hester Primary Care Mkenzie Dotts: Tina Hester Other Clinician: Referring Josee Speece: Treating Maximilliano Kersh/Extender: Tina Hester, Tina Hester in Treatment: 0 Height (in): Weight (lbs): Body Mass Index (BMI): Nutrition Risk Screening Items Score Screening NUTRITION RISK SCREEN: I have an illness or condition that made me change the kind and/or amount of food I eat 2 Yes I eat fewer than two meals per day 0 No I eat few fruits and vegetables, or milk products 0 No I have three or more drinks of beer, liquor or wine almost every day 0 No I have tooth or mouth problems that make it hard for me to eat 0 No I don't always have enough money to buy the food I need 0 No I eat alone most of the time 0 No I take three or more different prescribed or over-the-counter drugs a day 1 Yes Without wanting to, I have lost or gained 10 pounds in the last six months 0 No I am not always physically able to shop, cook and/or feed myself 0 No Nutrition Protocols Good Risk Protocol Provide education on elevated blood Moderate Risk Protocol 0 sugars and impact on wound healing, as applicable High Risk Proctocol Risk Level: Moderate Risk Score: 3 Electronic  Signature(s) Signed: 03/08/2023 5:26:31 PM By: Tina Stall RN, BSN Entered By: Tina Hester on 03/08/2023 10:01:26

## 2023-03-09 NOTE — Progress Notes (Signed)
TEAUNA, DUBACH ([id number]) [id number] Page 1 of 9 Visit Report for 03/08/2023 Chief Complaint Document Details Patient Name: Date of Service: Tina Hester 03/08/2023 9:30 A M Medical Record Number: [id number] Patient Account Number: [id number] Date of Birth/Sex: Treating RN: [birth year] (82 y.o. F) Primary Care Provider: [name] Other Clinician: Referring Provider: Treating Provider/Extender: Herbert Moors, Max Weeks in Treatment: 0 Information Obtained from: Patient Chief Complaint 03/08/2023; Right heel wound Electronic Signature(s) Signed: 03/08/2023 2:56:31 PM By: Geralyn Corwin DO Entered By: Geralyn Tina Hester on 03/08/2023 12:25:17 -------------------------------------------------------------------------------- Debridement Details Patient Name: Date of Service: Tina Hester, Tina BERTA N. 03/08/2023 9:30 A M Medical Record Number: [id number] Patient Account Number: [id number] Date of Birth/Sex: Treating RN: 1941-01-23 (82 y.o. Tina Hester, Tina Hester Primary Care Provider: [name] Other Clinician: Referring Provider: Treating Provider/Extender: Herbert Moors, Max Weeks in Treatment: 0 Debridement Performed for Assessment: Wound #1 Right Calcaneus Performed By: Physician Geralyn Corwin, DO Debridement Type: Debridement Severity of Tissue Pre Debridement: Fat layer exposed Level of Consciousness (Pre-procedure): Awake and Alert Pre-procedure Verification/Time Out Yes - 10:50 Taken: Start Time: 10:51 Pain Control: Lidocaine 4% Topical Solution Percent of Wound Bed Debrided: 100% T Area Debrided (cm): otal 2.53 Tissue and other material debrided: Non-Viable, Eschar, Slough, Slough Level: Non-Viable Tissue Debridement Description: Selective/Open Wound Instrument: Blade, Forceps Bleeding: None End Time: 10:58 Procedural Pain: 0 Post Procedural Pain: 0 Response to Treatment: Procedure was tolerated well Level  of Consciousness (Post- Awake and Alert procedure): Post Debridement Measurements of Total Wound Length: (cm) 2.3 Width: (cm) 1.4 Depth: (cm) 0.2 Volume: (cm) 0.506 Character of Wound/Ulcer Post Debridement: Requires Further Debridement Severity of Tissue Post Debridement: Fat layer exposed Tina Hester, Tina Hester ([id number]) [phone].pdf Page 2 of 9 Post Procedure Diagnosis Same as Pre-procedure Electronic Signature(s) Signed: 03/08/2023 2:56:31 PM By: Geralyn Corwin DO Signed: 03/08/2023 5:26:31 PM By: Shawn Stall RN, BSN Entered By: Shawn Stall on 03/08/2023 10:58:23 -------------------------------------------------------------------------------- HPI Details Patient Name: Date of Service: Tina Hester, Tina BERTA N. 03/08/2023 9:30 A M Medical Record Number: [id number] Patient Account Number: [id number] Date of Birth/Sex: Treating RN: 12-05-1940 (82 y.o. F) Primary Care Provider: [name] Other Clinician: Referring Provider: Treating Provider/Extender: Herbert Moors, Max Weeks in Treatment: 0 History of Present Illness HPI Description: 03/08/2023 Ms. Tina Hester is an 82 year old female with a past medical history of uncontrolled insulin-dependent type 2 diabetes, peripheral arterial disease status post revascularization, atrial fibrillation on Eliquis and hypertension that presents the clinic for a 67-month history of nonhealing wound to the right heel. She was residing in [place] at a nursing facility when she developed a pressure injury. She recently moved in with her daughter a couple months ago here in [place]. She visited the [facility], ED on 12/30/2022 for left foot pain and was noted to have a pressure injury at that time. She had an MRI of the right foot that did not show evidence of osteomyelitis. She had ABIs that showed an ABI of 1.04 on the right with a TBI of 0.54 and monophasic waveforms. She had a right lower extremity angiogram on 6/28  with right femoral-popliteal angioplasty and stenting. It is not clear when the pressure injury became an open wound. However for the past 1-2 months she has been using Betadine to the wound bed. She currently denies signs of infection. She is wearing shoes with open back. Electronic Signature(s) Signed: 03/08/2023 2:56:31 PM By: Geralyn Corwin DO Entered By: Geralyn Tina Hester on 03/08/2023 12:36:37 --------------------------------------------------------------------------------  Physical Exam Details Patient Name: Date of Service: Tina Hester 03/08/2023 9:30 A M Medical Record Number: [id number] Patient Account Number: [id number] Date of Birth/Sex: Treating RN: November 27, 1940 (82 y.o. F) Primary Care Provider: [name] Other Clinician: Referring Provider: Treating Provider/Extender: Herbert Moors, Max Weeks in Treatment: 0 Constitutional respirations regular, non-labored and within target range for patient.. Cardiovascular 2+ dorsalis pedis/posterior tibialis pulses. Psychiatric pleasant and cooperative. Notes Right foot: T the posterior heel there is an open wound with nonviable tissue and a rim of granulation tissue. o Electronic Signature(s) Signed: 03/08/2023 2:56:31 PM By: Lady Saucier ([id number]) [id number] Page 3 of 9 Entered By: Geralyn Tina Hester on 03/08/2023 12:40:54 -------------------------------------------------------------------------------- Physician Orders Details Patient Name: Date of Service: Tina Hester 03/08/2023 9:30 A M Medical Record Number: [id number] Patient Account Number: [id number] Date of Birth/Sex: Treating RN: November 08, 1940 (82 y.o. Tina Hester, Tina Hester Primary Care Provider: [name] Other Clinician: Referring Provider: Treating Provider/Extender: Herbert Moors, Max Weeks in Treatment: 0 Verbal / Phone Orders: No Diagnosis Coding ICD-10 Coding Code  Description E11.621 Type 2 diabetes mellitus with foot ulcer I10 Essential (primary) hypertension G47.30 Sleep apnea, unspecified I73.9 Peripheral vascular disease, unspecified Follow-up Appointments ppointment in 1 week. - Dr. Mikey Bussing 130pm Tuesday 03/15/2023 room 8 Return A Other: - Santyl will be sent to [facility]- they will call you and ship to you. May use medihoney in place of [place] until it arrives. Anesthetic (In clinic) Topical Lidocaine 4% applied to wound bed Bathing/ Shower/ Hygiene May shower and wash wound with soap and water. Edema Control - Lymphedema / SCD / Other Elevate legs to the level of the heart or above for 30 minutes daily and/or when sitting for 3-4 times a day throughout the day. Avoid standing for long periods of time. Off-Loading Prevalon Boot - will provide you with one at next appointment time. In the meantime ensure to float heel off of the chair and bed with a pillow or rolled sheet. Ensure no pressure to right heel. Home Health New wound care orders this week; continue Home Health for wound care. May utilize formulary equivalent dressing for wound treatment orders unless otherwise specified. - Home health to change weekly. All other days family to change dressing. PT can continue as long as there is no pressure to right heel wound. Other Home Health Orders/Instructions: - [facility] Wound Treatment Wound #1 - Calcaneus Wound Laterality: Right Cleanser: Vashe 5.8 (oz) (Home Health) 1 x Per Day/30 Days Discharge Instructions: Cleanse the wound with Vashe prior to applying a clean dressing using gauze sponges, not tissue or cotton balls. Prim Dressing: Santyl Ointment 1 x Per Day/30 Days ary Discharge Instructions: Apply nickel thick amount to wound bed as instructed. Medihoney in place of santyl until arrives. Secondary Dressing: ALLEVYN Heel 4 1/2in x 5 1/2in / 10.5cm x 13.5cm (Home Health) 1 x Per Day/30 Days Discharge  Instructions: Apply over primary dressing as directed. Secondary Dressing: Woven Gauze Sponge, Non-Sterile 4x4 in (Home Health) 1 x Per Day/30 Days Discharge Instructions: Apply over primary dressing as directed. Secured With: Web designer, Sterile 4x75 (in/in) (Home Health) 1 x Per Day/30 Days Discharge Instructions: Secure with stretch gauze as directed. Secured With: 8M Medipore H Soft Cloth Surgical T ape, 4 x 10 (in/yd) (Home Health) 1 x Per Day/30 Days Discharge Instructions: Secure with tape as directed. Secured With: Stretch net 1 x Per Day/30 Days  Discharge Instructions: to secure dressing in place. Tina Hester, Tina Hester ([id number]) [id number] Page 4 of 9 Patient Medications llergies: pneumococcal 14-valent polysac vaccine, Pneumovax-23, Sulfa (Sulfonamide Antibiotics), pregabalin A Notifications Medication Indication Start End applied in clinic for 03/08/2023 lidocaine debridements DOSE topical 4 % gel - gel topical once daily 03/08/2023 Santyl DOSE 1 - topical 250 unit/gram ointment - Apply once daily to the wound bed Electronic Signature(s) Signed: 03/08/2023 2:56:31 PM By: Geralyn Corwin DO Previous Signature: 03/08/2023 1:02:28 PM Version By: Geralyn Corwin DO Entered By: Geralyn Tina Hester on 03/08/2023 13:02:36 -------------------------------------------------------------------------------- Problem List Details Patient Name: Date of Service: Tina Hester, Tina BERTA N. 03/08/2023 9:30 A M Medical Record Number: [id number] Patient Account Number: [id number] Date of Birth/Sex: Treating RN: 27-Dec-1940 (82 y.o. Tina Hester, Tina Hester Primary Care Provider: [name] Other Clinician: Referring Provider: Treating Provider/Extender: Herbert Moors, Max Weeks in Treatment: 0 Active Problems ICD-10 Encounter Code Description Active Date MDM Diagnosis L89.610 Pressure ulcer of right heel, unstageable 03/08/2023 No  Yes E11.621 Type 2 diabetes mellitus with foot ulcer 03/08/2023 No Yes I10 Essential (primary) hypertension 03/08/2023 No Yes I73.9 Peripheral vascular disease, unspecified 03/08/2023 No Yes G47.30 Sleep apnea, unspecified 03/08/2023 No Yes Inactive Problems Resolved Problems Electronic Signature(s) Signed: 03/08/2023 2:56:31 PM By: Geralyn Corwin DO Entered By: Geralyn Tina Hester on 03/08/2023 12:24:41 Tina Hester ([id number]) [id number] Page 5 of 9 -------------------------------------------------------------------------------- Progress Note Details Patient Name: Date of Service: Tina Hester 03/08/2023 9:30 A M Medical Record Number: [id number] Patient Account Number: [id number] Date of Birth/Sex: Treating RN: 1940-12-01 (82 y.o. F) Primary Care Provider: [name] Other Clinician: Referring Provider: Treating Provider/Extender: Herbert Moors, Max Weeks in Treatment: 0 Subjective Chief Complaint Information obtained from Patient 03/08/2023; Right heel wound History of Present Illness (HPI) 03/08/2023 Ms. Tina Hester is an 82 year old female with a past medical history of uncontrolled insulin-dependent type 2 diabetes, peripheral arterial disease status post revascularization, atrial fibrillation on Eliquis and hypertension that presents the clinic for a 42-month history of nonhealing wound to the right heel. She was residing in [place] at a nursing facility when she developed a pressure injury. She recently moved in with her daughter a couple months ago here in [place]. She visited the [facility], ED on 12/30/2022 for left foot pain and was noted to have a pressure injury at that time. She had an MRI of the right foot that did not show evidence of osteomyelitis. She had ABIs that showed an ABI of 1.04 on the right with a TBI of 0.54 and monophasic waveforms. She had a right lower extremity angiogram on 6/28 with right femoral-popliteal  angioplasty and stenting. It is not clear when the pressure injury became an open wound. However for the past 1-2 months she has been using Betadine to the wound bed. She currently denies signs of infection. She is wearing shoes with open back. Patient History Information obtained from Patient, Caregiver, Chart. Allergies pneumococcal 14-valent polysac vaccine, Pneumovax-23, Sulfa (Sulfonamide Antibiotics), pregabalin Family History Heart Disease - Siblings. Social History Never smoker, Marital Status - Divorced, Alcohol Use - Never, Drug Use - No History, Caffeine Use - Daily - soda. Medical History Hematologic/Lymphatic Patient has history of Anemia Respiratory Patient has history of Sleep Apnea - CPAP- does not wear Cardiovascular Patient has history of Arrhythmia - A.fib, Hypertension, Peripheral Arterial Disease Endocrine Patient has history of Type II Diabetes Musculoskeletal Patient has history of Gout Patient is treated with Insulin. Blood sugar is tested. Hospitalization/Surgery History -  02/25/2023 angiography. - hysterectomy. - appendectomy. - back surgery. - bladder surgery. - cholecystectomy. - c-section. Medical A Surgical History Notes nd Constitutional Symptoms (General Health) TIA morbid obesity mild cognitive impairment Herpes simplex 1 Respiratory Pulmonary HTN Cardiovascular Aortic stenosis Gastrointestinal GERD Genitourinary CKD stage III Psychiatric anxiety Review of Systems (ROS) Integumentary (Skin) Complains or has symptoms of Wounds - right heel. [address] LONDA, MACKOWSKI ([id number]) [id number] Page 6 of 9 Constitutional respirations regular, non-labored and within target range for patient.. Vitals Time Taken: 9:58 AM, Height: 61 in, Source: Stated, Weight: 200 lbs, Source: Stated, BMI: 37.8, Temperature: 98.8 F, Pulse: 76 bpm, Respiratory Rate: 20 breaths/min, Blood Pressure: 116/68 mmHg, Capillary Blood Glucose:  93 mg/dl. Cardiovascular 2+ dorsalis pedis/posterior tibialis pulses. Psychiatric pleasant and cooperative. General Notes: Right foot: T the posterior heel there is an open wound with nonviable tissue and a rim of granulation tissue. o Integumentary (Hester, Skin) Wound #1 status is Open. Original cause of wound was Pressure Injury. The date acquired was: 10/29/2022. The wound is located on the Right Calcaneus. The wound measures 2.3cm length x 1.4cm width x 0.2cm depth; 2.529cm^2 area and 0.506cm^3 volume. There is no tunneling or undermining noted. There is a medium amount of serosanguineous drainage noted. The wound margin is distinct with the outline attached to the wound base. There is no granulation within the wound bed. There is a large (67-100%) amount of necrotic tissue within the wound bed including Eschar and Adherent Slough. The periwound skin appearance did not exhibit: Callus, Crepitus, Excoriation, Induration, Rash, Scarring, Dry/Scaly, Maceration, Atrophie Blanche, Cyanosis, Ecchymosis, Hemosiderin Staining, Mottled, Pallor, Rubor, Erythema. Assessment Active Problems ICD-10 Pressure ulcer of right heel, unstageable Type 2 diabetes mellitus with foot ulcer Essential (primary) hypertension Peripheral vascular disease, unspecified Sleep apnea, unspecified Patient presents with a 37-month history of pressure injury resulting in a pressure ulcer that was nonhealing due to type 2 diabetes and arterial insufficiency. She has been revascularized on the right lower extremity. She had right femoral-popliteal angioplasty and stenting. Her posterior tibial artery has proximal stenosis which is not flow-limiting. She has a dominant tibial to the foot and feels the plantar arch. I was able to hear pedal pulses on Doppler. I removed the eschar and slough to the wound bed. I recommended Santyl for daily dressing changes. I also recommended aggressive offloading and floating her heels when she  is in bed. We will order her Prevalon boot. No signs of infection. Follow-up in 1 week. Procedures Wound #1 Pre-procedure diagnosis of Wound #1 is a Diabetic Wound/Ulcer of the Lower Extremity located on the Right Calcaneus .Severity of Tissue Pre Debridement is: Fat layer exposed. There was a Selective/Open Wound Non-Viable Tissue Debridement with a total area of 2.53 sq cm performed by Geralyn Corwin, DO. With the following instrument(s): Blade, and Forceps to remove Non-Viable tissue/material. Material removed includes Eschar and Slough and after achieving pain control using Lidocaine 4% T opical Solution. A time out was conducted at 10:50, prior to the start of the procedure. There was no bleeding. The procedure was tolerated well with a pain level of 0 throughout and a pain level of 0 following the procedure. Post Debridement Measurements: 2.3cm length x 1.4cm width x 0.2cm depth; 0.506cm^3 volume. Character of Wound/Ulcer Post Debridement requires further debridement. Severity of Tissue Post Debridement is: Fat layer exposed. Post procedure Diagnosis Wound #1: Same as Pre-Procedure Plan Follow-up Appointments: Return Appointment in 1 week. - Dr. Mikey Bussing 130pm Tuesday 03/15/2023 room 8 Other: - Melburn Popper  will be sent to [facility]- they will call you and ship to you. May use medihoney in place of [place] until it arrives. Anesthetic: (In clinic) Topical Lidocaine 4% applied to wound bed Bathing/ Shower/ Hygiene: May shower and wash wound with soap and water. Edema Control - Lymphedema / SCD / Other: Elevate legs to the level of the heart or above for 30 minutes daily and/or when sitting for 3-4 times a day throughout the day. Avoid standing for long periods of time. Off-Loading: Prevalon Boot - will provide you with one at next appointment time. In the meantime ensure to float heel off of the chair and bed with a pillow or rolled sheet. Ensure no pressure to right heel. Home  Health: New wound care orders this week; continue Home Health for wound care. May utilize formulary equivalent dressing for wound treatment orders unless otherwise specified. - Home health to change weekly. All other days family to change dressing. PT can continue as long as there is no pressure to right heel wound. Tina Hester, Tina Hester ([id number]) [id number] Page 7 of 9 Other Home Health Orders/Instructions: - [facility] The following medication(s) was prescribed: lidocaine topical 4 % gel gel topical once daily for applied in clinic for debridements was prescribed at facility Santyl topical 250 unit/gram ointment 1 Apply once daily to the wound bed starting 03/08/2023 WOUND #1: - Calcaneus Wound Laterality: Right Cleanser: Vashe 5.8 (oz) (Home Health) 1 x Per Day/30 Days Discharge Instructions: Cleanse the wound with Vashe prior to applying a clean dressing using gauze sponges, not tissue or cotton balls. Prim Dressing: Santyl Ointment 1 x Per Day/30 Days ary Discharge Instructions: Apply nickel thick amount to wound bed as instructed. Medihoney in place of santyl until arrives. Secondary Dressing: ALLEVYN Heel 4 1/2in x 5 1/2in / 10.5cm x 13.5cm (Home Health) 1 x Per Day/30 Days Discharge Instructions: Apply over primary dressing as directed. Secondary Dressing: Woven Gauze Sponge, Non-Sterile 4x4 in (Home Health) 1 x Per Day/30 Days Discharge Instructions: Apply over primary dressing as directed. Secured With: Web designer, Sterile 4x75 (in/in) (Home Health) 1 x Per Day/30 Days Discharge Instructions: Secure with stretch gauze as directed. Secured With: 23M Medipore H Soft Cloth Surgical T ape, 4 x 10 (in/yd) (Home Health) 1 x Per Day/30 Days Discharge Instructions: Secure with tape as directed. Secured With: Stretch net 1 x Per Day/30 Days Discharge Instructions: to secure dressing in place. 1. In office sharp debridement 2.  Aggressive offloading 3. Santyl 4. Follow-up in 1 week 5. Order Charity fundraiser) Signed: 03/08/2023 2:56:31 PM By: Geralyn Corwin DO Entered By: Geralyn Tina Hester on 03/08/2023 13:02:53 -------------------------------------------------------------------------------- HxROS Details Patient Name: Date of Service: Tina Hester, Tina BERTA N. 03/08/2023 9:30 A M Medical Record Number: [id number] Patient Account Number: [id number] Date of Birth/Sex: Treating RN: 08-Feb-1941 (82 y.o. Tina Hester, Tina Hester Primary Care Provider: [name] Other Clinician: Referring Provider: Treating Provider/Extender: Herbert Moors, Max Weeks in Treatment: 0 Information Obtained From Patient Caregiver Chart Integumentary (Skin) Complaints and Symptoms: Positive for: Wounds - right heel Constitutional Symptoms (General Health) Medical History: Past Medical History Notes: TIA morbid obesity mild cognitive impairment Herpes simplex 1 Hematologic/Lymphatic Medical History: Positive for: Anemia Respiratory Medical History: Positive for: Sleep Apnea - CPAP- does not wear Past Medical History Notes: Pulmonary HTN Cardiovascular Tina Hester, Tina Hester ([id number]) [id number] Page 8 of 9 Medical History: Positive for: Arrhythmia - A.fib; Hypertension; Peripheral Arterial Disease Past Medical History Notes:  Aortic stenosis Gastrointestinal Medical History: Past Medical History Notes: GERD Endocrine Medical History: Positive for: Type II Diabetes Time with diabetes: 30 years Treated with: Insulin Blood sugar tested every day: Yes Tested : Genitourinary Medical History: Past Medical History Notes: CKD stage III Musculoskeletal Medical History: Positive for: Gout Psychiatric Medical History: Past Medical History Notes: anxiety Immunizations Pneumococcal Vaccine: Received Pneumococcal Vaccination: Yes Received Pneumococcal Vaccination On or  After 60th Birthday: No Implantable Devices No devices added Hospitalization / Surgery History Type of Hospitalization/Surgery 02/25/2023 angiography hysterectomy appendectomy back surgery bladder surgery cholecystectomy c-section Family and Social History Heart Disease: Yes - Siblings; Never smoker; Marital Status - Divorced; Alcohol Use: Never; Drug Use: No History; Caffeine Use: Daily - soda; Financial Concerns: No; Food, Clothing or Shelter Needs: No; Support System Lacking: No; Transportation Concerns: No Electronic Signature(s) Signed: 03/08/2023 2:56:31 PM By: Geralyn Corwin DO Signed: 03/08/2023 5:26:31 PM By: Shawn Stall RN, BSN Entered By: Shawn Stall on 03/08/2023 10:05:21 -------------------------------------------------------------------------------- SuperBill Details Patient Name: Date of Service: Tina Hester, Tina BERTA N. 03/08/2023 Medical Record Number: [id number] Patient Account Number: [id number] Tina Hester, Tina Hester ([id number]) [id number] Page 9 of 9 Date of Birth/Sex: Treating RN: 08-15-1941 (82 y.o. Tina Hester, Tina Hester Primary Care Provider: [name] Other Clinician: Referring Provider: Treating Provider/Extender: Herbert Moors, Max Weeks in Treatment: 0 Diagnosis Coding ICD-10 Codes Code Description L89.610 Pressure ulcer of right heel, unstageable E11.621 Type 2 diabetes mellitus with foot ulcer I10 Essential (primary) hypertension I73.9 Peripheral vascular disease, unspecified G47.30 Sleep apnea, unspecified Facility Procedures : CPT4 Code: [id number] Description: 99213 - WOUND CARE VISIT-LEV 3 EST PT Modifier: Quantity: 1 : CPT4 Code: [id number] Description: 97597 - DEBRIDE WOUND 1ST 20 SQ CM OR < ICD-10 Diagnosis Description L89.610 Pressure ulcer of right heel, unstageable E11.621 Type 2 diabetes mellitus with foot ulcer Modifier: Quantity: 1 Physician Procedures : CPT4 Code Description Modifier [id number]  99204 - WC PHYS LEVEL 4 - NEW PT 25 ICD-10 Diagnosis Description L89.610 Pressure ulcer of right heel, unstageable E11.621 Type 2 diabetes mellitus with foot ulcer I73.9 Peripheral vascular disease, unspecified  G47.30 Sleep apnea, unspecified Quantity: 1 : [id number] 97597 - WC PHYS DEBR WO ANESTH 20 SQ CM ICD-10 Diagnosis Description L89.610 Pressure ulcer of right heel, unstageable E11.621 Type 2 diabetes mellitus with foot ulcer Quantity: 1 Electronic Signature(s) Signed: 03/08/2023 2:56:31 PM By: Geralyn Corwin DO Entered By: Geralyn Tina Hester on 03/08/2023 13:02:59

## 2023-03-09 NOTE — Progress Notes (Signed)
Tina, Hester ([id number]) [id number] Page 1 of 10 Visit Report for 03/08/2023 Allergy List Details Patient Name: Date of Service: Tina Hester 03/08/2023 9:30 A M Medical Record Number: [id number] Patient Account Number: [id number] Date of Birth/Sex: Treating RN: Oct 23, 1940 (82 y.o. [name] Primary Care Khanh Cordner: [name] Other Clinician: Referring Ahliyah Nienow: Treating Shanyce Daris/Extender: Herbert Moors, Max Weeks in Treatment: 0 Allergies Active Allergies pneumococcal 14-valent polysac vaccine Pneumovax-23 Sulfa (Sulfonamide Antibiotics) pregabalin Allergy Notes Electronic Signature(s) Signed: 03/08/2023 5:26:31 PM By: Shawn Stall RN, BSN Entered By: Shawn Stall on 03/07/2023 17:18:59 -------------------------------------------------------------------------------- Arrival Information Details Patient Name: Date of Service: Tina Hester, Tina BERTA N. 03/08/2023 9:30 A M Medical Record Number: [id number] Patient Account Number: [id number] Date of Birth/Sex: Treating RN: 08/04/1941 (82 y.o. Tina Hester, Tina Hester Primary Care Marybelle Giraldo: [name] Other Clinician: Referring Malak Orantes: Treating Deshannon Seide/Extender: Herbert Moors, Max Weeks in Treatment: 0 Visit Information Patient Arrived: Wheel Chair Arrival Time: 09:58 Accompanied By: daughter Transfer Assistance: Manual Patient Identification Verified: Yes Secondary Verification Process Completed: Yes Patient Requires Transmission-Based Precautions: No Patient Has Alerts: Yes Patient Alerts: Patient on Blood Thinner Eliquis Revascularization 02/25/23 02/14/23 ABI: L1.26 02/14/2023 TBI: L0.39 Electronic Signature(s) Signed: 03/08/2023 5:26:31 PM By: Shawn Stall RN, BSN Entered By: Shawn Stall on 03/08/2023 10:33:46 Tobey Bride ([id number]) [id number] Page 2 of  10 -------------------------------------------------------------------------------- Clinic Level of Care Assessment Details Patient Name: Date of Service: Tina Hester 03/08/2023 9:30 A M Medical Record Number: [id number] Patient Account Number: [id number] Date of Birth/Sex: Treating RN: 1941/03/25 (82 y.o. [name] Primary Care Tina Hester: [name] Other Clinician: Referring Tina Hester: Treating Tina Hester/Extender: Herbert Moors, Max Weeks in Treatment: 0 Clinic Level of Care Assessment Items TOOL 1 Quantity Score X- 1 0 Use when EandM and Procedure is performed on INITIAL visit ASSESSMENTS - Nursing Assessment / Reassessment X- 1 20 General Physical Exam (combine w/ comprehensive assessment (listed just below) when performed on new pt. evals) X- 1 25 Comprehensive Assessment (HX, ROS, Risk Assessments, Wounds Hx, etc.) ASSESSMENTS - Wound and Skin Assessment / Reassessment []  - 0 Dermatologic / Skin Assessment (not related to wound area) ASSESSMENTS - Ostomy and/or Continence Assessment and Care []  - 0 Incontinence Assessment and Management []  - 0 Ostomy Care Assessment and Management (repouching, etc.) PROCESS - Coordination of Care []  - 0 Simple Patient / Family Education for ongoing care X- 1 20 Complex (extensive) Patient / Family Education for ongoing care X- 1 10 Staff obtains Chiropractor, Records, T Results / Process Orders est X- 1 10 Staff telephones HHA, Nursing Homes / Clarify orders / etc []  - 0 Routine Transfer to another Facility (non-emergent condition) []  - 0 Routine Hospital Admission (non-emergent condition) X- 1 15 New Admissions / Manufacturing engineer / Ordering NPWT Apligraf, etc. , []  - 0 Emergency Hospital Admission (emergent condition) PROCESS - Special Needs []  - 0 Pediatric / Minor Patient Management []  - 0 Isolation Patient Management []  - 0 Hearing / Language / Visual special needs []  - 0 Assessment of  Community assistance (transportation, D/C planning, etc.) []  - 0 Additional assistance / Altered mentation []  - 0 Support Surface(s) Assessment (bed, cushion, seat, etc.) INTERVENTIONS - Miscellaneous []  - 0 External ear exam []  - 0 Patient Transfer (multiple staff / Nurse, adult / Similar devices) []  - 0 Simple Staple / Suture removal (25 or less) []  - 0 Complex Staple / Suture removal (26 or more) []  - 0 Hypo/Hyperglycemic Management (  do not check if billed separately) []  - 0 Ankle / Brachial Index (ABI) - do not check if billed separately Has the patient been seen at the hospital within the last three years: Yes Total Score: 100 Level Of Care: New/Established - Level 3 Electronic Signature(s) Signed: 03/08/2023 5:26:31 PM By: Shawn Stall RN, BSN Gillian, Jarold Song ([id number]) Shawn Stall RN, BSN [phone].pdf Page 3 of 10 Signed: 03/08/2023 5:26:31 PM By: Margaret Pyle By: Shawn Stall on 03/08/2023 10:37:24 -------------------------------------------------------------------------------- Encounter Discharge Information Details Patient Name: Date of Service: Tina Hester, Tina BERTA N. 03/08/2023 9:30 A M Medical Record Number: [id number] Patient Account Number: [id number] Date of Birth/Sex: Treating RN: 10/26/1940 (82 y.o. Tina Hester, Tina Hester Primary Care Kerianna Rawlinson: [name] Other Clinician: Referring Merly Hinkson: Treating Tina Hester/Extender: Herbert Moors, Max Weeks in Treatment: 0 Encounter Discharge Information Items Post Procedure Vitals Discharge Condition: Stable Temperature (F): 98.8 Ambulatory Status: Wheelchair Pulse (bpm): 76 Discharge Destination: Home Respiratory Rate (breaths/min): 20 Transportation: Private Auto Blood Pressure (mmHg): 116/68 Accompanied By: daughter Schedule Follow-up Appointment: Yes Clinical Summary of Care: Electronic Signature(s) Signed: 03/08/2023 5:26:31 PM By: Shawn Stall RN, BSN Entered By: Shawn Stall  on 03/08/2023 11:00:39 -------------------------------------------------------------------------------- Lower Extremity Assessment Details Patient Name: Date of Service: Tina Cash N. 03/08/2023 9:30 A M Medical Record Number: [id number] Patient Account Number: [id number] Date of Birth/Sex: Treating RN: 26-Feb-1941 (82 y.o. [name] Primary Care Nelson Noone: [name] Other Clinician: Referring Geofrey Silliman: Treating Rashod Gougeon/Extender: Herbert Moors, Max Weeks in Treatment: 0 Edema Assessment Assessed: [Left: No] [Right: Yes] Edema: [Left: N] [Right: o] Calf Left: Right: Point of Measurement: 27 cm From Medial Instep 35 cm Ankle Left: Right: Point of Measurement: 8 cm From Medial Instep 20 cm Knee To Floor Left: Right: From Medial Instep 38 cm Vascular Assessment Pulses: Dorsalis Pedis Palpable: [Right:Yes] SHERRIA, RIEMANN ([id number]) [Right:[id number] Page 4 of 10] Electronic Signature(s) Signed: 03/08/2023 5:26:31 PM By: Shawn Stall RN, BSN Entered By: Shawn Stall on 03/08/2023 10:13:01 -------------------------------------------------------------------------------- Multi Wound Chart Details Patient Name: Date of Service: Tina Hester, Tina BERTA N. 03/08/2023 9:30 A M Medical Record Number: [id number] Patient Account Number: [id number] Date of Birth/Sex: Treating RN: 1941/06/22 (82 y.o. F) Primary Care Aparna Vanderweele: [name] Other Clinician: Referring Djibril Glogowski: Treating Midas Daughety/Extender: Herbert Moors, Max Weeks in Treatment: 0 Vital Signs Height(in): 61 Capillary Blood Glucose(mg/dl): 93 Weight(lbs): 166 Pulse(bpm): 76 Body Mass Index(BMI): 37.8 Blood Pressure(mmHg): 116/68 Temperature(F): 98.8 Respiratory Rate(breaths/min): 20 [1:Photos:] [N/A:N/A] Right Calcaneus N/A N/A Wound Location: Pressure Injury N/A N/A Wounding Event: Diabetic Wound/Ulcer of the Lower N/A N/A Primary  Etiology: Extremity Pressure Ulcer N/A N/A Secondary Etiology: Anemia, Sleep Apnea, Arrhythmia, N/A N/A Comorbid History: Hypertension, Peripheral Arterial Disease, Type II Diabetes, Gout 10/29/2022 N/A N/A Date Acquired: 0 N/A N/A Weeks of Treatment: Open N/A N/A Wound Status: No N/A N/A Wound Recurrence: 2.3x1.4x0.2 N/A N/A Measurements L x W x D (cm) 2.529 N/A N/A A (cm) : rea 0.506 N/A N/A Volume (cm) : Unable to visualize wound bed N/A N/A Classification: Medium N/A N/A Exudate A mount: Serosanguineous N/A N/A Exudate Type: red, brown N/A N/A Exudate Color: Distinct, outline attached N/A N/A Wound Margin: None Present (0%) N/A N/A Granulation A mount: Large (67-100%) N/A N/A Necrotic A mount: Eschar, Adherent Slough N/A N/A Necrotic Tissue: Fascia: No N/A N/A Exposed Structures: Fat Layer (Subcutaneous Tissue): No Tendon: No Muscle: No Joint: No Bone: No None N/A N/A Epithelialization: Debridement - Selective/Open Wound N/A N/A Debridement: Pre-procedure Verification/Time Out  10:50 N/A N/A Taken: Lidocaine 4% Topical Solution N/A N/A Pain Control: Necrotic/Eschar, Slough N/A N/A Tissue Debrided: Non-Viable Tissue N/A N/A Level: 2.53 N/A N/A Debridement A (sq cm): rea Blade, Forceps N/A N/A Instrument: None N/A N/A Bleeding: 0 N/A N/A Procedural Pain: ARMONEE, BOJANOWSKI ([id number]) [id number] Page 5 of 10 0 N/A N/A Post Procedural Pain: Procedure was tolerated well N/A N/A Debridement Treatment Response: 2.3x1.4x0.2 N/A N/A Post Debridement Measurements L x W x D (cm) 0.506 N/A N/A Post Debridement Volume: (cm) Excoriation: No N/A N/A Periwound Skin Texture: Induration: No Callus: No Crepitus: No Rash: No Scarring: No Maceration: No N/A N/A Periwound Skin Moisture: Dry/Scaly: No Atrophie Blanche: No N/A N/A Periwound Skin Color: Cyanosis: No Ecchymosis: No Erythema: No Hemosiderin Staining:  No Mottled: No Pallor: No Rubor: No Debridement N/A N/A Procedures Performed: Treatment Notes Wound #1 (Calcaneus) Wound Laterality: Right Cleanser Vashe 5.8 (oz) Discharge Instruction: Cleanse the wound with Vashe prior to applying a clean dressing using gauze sponges, not tissue or cotton balls. Peri-Wound Care Topical Primary Dressing Santyl Ointment Discharge Instruction: Apply nickel thick amount to wound bed as instructed. Medihoney in place of santyl until arrives. Secondary Dressing ALLEVYN Heel 4 1/2in x 5 1/2in / 10.5cm x 13.5cm Discharge Instruction: Apply over primary dressing as directed. Woven Gauze Sponge, Non-Sterile 4x4 in Discharge Instruction: Apply over primary dressing as directed. Secured With Conforming Stretch Gauze Bandage Roll, Sterile 4x75 (in/in) Discharge Instruction: Secure with stretch gauze as directed. 65M Medipore H Soft Cloth Surgical T ape, 4 x 10 (in/yd) Discharge Instruction: Secure with tape as directed. Stretch net Discharge Instruction: to secure dressing in place. Compression Wrap Compression Stockings Add-Ons Electronic Signature(s) Signed: 03/08/2023 2:56:31 PM By: Geralyn Corwin DO Entered By: Geralyn Corwin on 03/08/2023 12:24:47 -------------------------------------------------------------------------------- Multi-Disciplinary Care Plan Details Patient Name: Date of Service: Tina Hester, Tina BERTA N. 03/08/2023 9:30 A M Medical Record Number: [id number] Patient Account Number: [id number] Date of Birth/Sex: Treating RN: 12-08-1940 (82 y.o. [name] Primary Care Kaden Dunkel: [name] Other Clinician: Tobey Bride ([id number]) [id number] Page 6 of 10 Referring Adiba Fargnoli: Treating Ikran Patman/Extender: Herbert Moors, Max Weeks in Treatment: 0 Active Inactive Necrotic Tissue Nursing Diagnoses: Impaired tissue integrity related to necrotic/devitalized  tissue Goals: Necrotic/devitalized tissue will be minimized in the wound bed Date Initiated: 03/08/2023 Target Resolution Date: 04/08/2023 Goal Status: Active Interventions: Assess patient pain level pre-, during and post procedure and prior to discharge Provide education on necrotic tissue and debridement process Treatment Activities: Apply topical anesthetic as ordered : 03/08/2023 Biologic debridement : 03/08/2023 Enzymatic debridement : 03/08/2023 Notes: Orientation to the Wound Care Program Nursing Diagnoses: Knowledge deficit related to the wound healing center program Goals: Patient/caregiver will verbalize understanding of the Wound Healing Center Program Date Initiated: 03/08/2023 Target Resolution Date: 04/08/2023 Goal Status: Active Interventions: Provide education on orientation to the wound center Notes: Pain, Acute or Chronic Nursing Diagnoses: Pain, acute or chronic: actual or potential Potential alteration in comfort, pain Goals: Patient will verbalize adequate pain control and receive pain control interventions during procedures as needed Date Initiated: 03/08/2023 Target Resolution Date: 04/08/2023 Goal Status: Active Patient/caregiver will verbalize adequate pain control between visits Date Initiated: 03/08/2023 Target Resolution Date: 04/08/2023 Goal Status: Active Interventions: Encourage patient to take pain medications as prescribed Provide education on pain management Treatment Activities: Administer pain control measures as ordered : 03/08/2023 Notes: Wound/Skin Impairment Nursing Diagnoses: Knowledge deficit related to ulceration/compromised skin integrity Goals: Patient/caregiver will verbalize understanding of skin care regimen  Date Initiated: 03/08/2023 Target Resolution Date: 04/08/2023 Goal Status: Active Interventions: Assess patient/caregiver ability to perform ulcer/skin care regimen upon admission and as needed BRYLEIGH, OTTAWAY ([id number])  [id number] Page 7 of 10 Assess ulceration(s) every visit Provide education on ulcer and skin care Treatment Activities: Skin care regimen initiated : 03/08/2023 Topical wound management initiated : 03/08/2023 Notes: Electronic Signature(s) Signed: 03/08/2023 5:26:31 PM By: Shawn Stall RN, BSN Entered By: Shawn Stall on 03/08/2023 10:36:05 -------------------------------------------------------------------------------- Pain Assessment Details Patient Name: Date of Service: Tina Hester, Tina BERTA N. 03/08/2023 9:30 A M Medical Record Number: [id number] Patient Account Number: [id number] Date of Birth/Sex: Treating RN: [birth year] (82 y.o. [name] Primary Care Alazar Cherian: [name] Other Clinician: Referring Gerry Heaphy: Treating Maverick Patman/Extender: Herbert Moors, Max Weeks in Treatment: 0 Active Problems Location of Pain Severity and Description of Pain Patient Has Paino No Site Locations Pain Management and Medication Current Pain Management: Notes most pain occurs when walking. Electronic Signature(s) Signed: 03/08/2023 5:26:31 PM By: Shawn Stall RN, BSN Entered By: Shawn Stall on 03/08/2023 10:02:05 Patient/Caregiver Education Details -------------------------------------------------------------------------------- Tobey Bride ([id number]) [id number] Page 8 of 10 Patient Name: Date of Service: A Cain Sieve 7/9/2024andnbsp9:30 A M Medical Record Number: [id number] Patient Account Number: [id number] Date of Birth/Gender: Treating RN: 06/06/1941 (82 y.o. Tina Hester, Tina Hester Primary Care Physician: [name] Other Clinician: Referring Physician: Treating Physician/Extender: Herbert Moors, Max Weeks in Treatment: 0 Education Assessment Education Provided To: Patient and Caregiver Education Topics Provided Welcome T [facility] Packet: o Handouts: [facility] W[facility]  Healing Pledge form, Welcome T [facility] o Methods: Explain/Verbal Responses: Reinforcements needed Electronic Signature(s) Signed: 03/08/2023 5:26:31 PM By: Shawn Stall RN, BSN Entered By: Shawn Stall on 03/08/2023 10:36:23 -------------------------------------------------------------------------------- Wound Assessment Details Patient Name: Date of Service: Tina Hester, Tina BERTA N. 03/08/2023 9:30 A M Medical Record Number: [id number] Patient Account Number: [id number] Date of Birth/Sex: Treating RN: [birth year] (82 y.o. Tina Hester, Millard.Loa Primary Care Kshawn Canal: [name] Other Clinician: Referring Charlesetta Milliron: Treating Monterrius Cardosa/Extender: Herbert Moors, Max Weeks in Treatment: 0 Wound Status Wound Number: 1 Primary Diabetic Wound/Ulcer of the Lower Extremity Etiology: Wound Location: Right Calcaneus Secondary Pressure Ulcer Wounding Event: Pressure Injury Etiology: Date Acquired: 10/29/2022 Wound Open Weeks Of Treatment: 0 Status: Clustered Wound: No Comorbid Anemia, Sleep Apnea, Arrhythmia, Hypertension, Peripheral History: Arterial Disease, Type II Diabetes, Gout Photos Wound Measurements Length: (cm) 2.3 Width: (cm) 1.4 Depth: (cm) 0.2 Area: (cm) 2.529 Volume: (cm) 0.506 % Reduction in Area: % Reduction in Volume: Epithelialization: None Tunneling: No Undermining: No Wound Description Classification: Unable to visualize wound bed IRASEMA, CHALK ([id number]) Wound Margin: Distinct, outline attached Exudate Amount: Medium Exudate Type: Serosanguineous Exudate Color: red, brown Foul Odor After Cleansing: No [id number] Page 9 of 10 Slough/Fibrino Yes Wound Bed Granulation Amount: None Present (0%) Exposed Structure Necrotic Amount: Large (67-100%) Fascia Exposed: No Necrotic Quality: Eschar, Adherent Slough Fat Layer (Subcutaneous Tissue) Exposed: No Tendon Exposed: No Muscle Exposed: No Joint Exposed:  No Bone Exposed: No Periwound Skin Texture Texture Color No Abnormalities Noted: No No Abnormalities Noted: No Callus: No Atrophie Blanche: No Crepitus: No Cyanosis: No Excoriation: No Ecchymosis: No Induration: No Erythema: No Rash: No Hemosiderin Staining: No Scarring: No Mottled: No Pallor: No Moisture Rubor: No No Abnormalities Noted: No Dry / Scaly: No Maceration: No Treatment Notes Wound #1 (Calcaneus) Wound Laterality: Right Cleanser Vashe 5.8 (oz) Discharge Instruction: Cleanse the wound with Vashe prior to applying  a clean dressing using gauze sponges, not tissue or cotton balls. Peri-Wound Care Topical Primary Dressing Santyl Ointment Discharge Instruction: Apply nickel thick amount to wound bed as instructed. Medihoney in place of santyl until arrives. Secondary Dressing ALLEVYN Heel 4 1/2in x 5 1/2in / 10.5cm x 13.5cm Discharge Instruction: Apply over primary dressing as directed. Woven Gauze Sponge, Non-Sterile 4x4 in Discharge Instruction: Apply over primary dressing as directed. Secured With Conforming Stretch Gauze Bandage Roll, Sterile 4x75 (in/in) Discharge Instruction: Secure with stretch gauze as directed. 47M Medipore H Soft Cloth Surgical T ape, 4 x 10 (in/yd) Discharge Instruction: Secure with tape as directed. Stretch net Discharge Instruction: to secure dressing in place. Compression Wrap Compression Stockings Add-Ons Electronic Signature(s) Signed: 03/08/2023 5:26:31 PM By: Shawn Stall RN, BSN Entered By: Shawn Stall on 03/08/2023 10:19:05 Tobey Bride ([id number]) [id number] Page 10 of 10 -------------------------------------------------------------------------------- Vitals Details Patient Name: Date of Service: Tina Hester 03/08/2023 9:30 A M Medical Record Number: [id number] Patient Account Number: [id number] Date of Birth/Sex: Treating RN: 1941/04/17 (82 y.o. Tina Hester, Tina Hester Primary  Care Emslee Lopezmartinez: [name] Other Clinician: Referring Dorin Stooksbury: Treating Navada Osterhout/Extender: Herbert Moors, Max Weeks in Treatment: 0 Vital Signs Time Taken: 09:58 Temperature (F): 98.8 Height (in): 61 Pulse (bpm): 76 Source: Stated Respiratory Rate (breaths/min): 20 Weight (lbs): 200 Blood Pressure (mmHg): 116/68 Source: Stated Capillary Blood Glucose (mg/dl): 93 Body Mass Index (BMI): 37.8 Reference Range: 80 - 120 mg / dl Electronic Signature(s) Signed: 03/08/2023 5:26:31 PM By: Shawn Stall RN, BSN Entered By: Shawn Stall on 03/08/2023 10:07:07

## 2023-03-11 DIAGNOSIS — I70234 Atherosclerosis of native arteries of right leg with ulceration of heel and midfoot: Secondary | ICD-10-CM | POA: Diagnosis not present

## 2023-03-11 DIAGNOSIS — Z79899 Other long term (current) drug therapy: Secondary | ICD-10-CM | POA: Diagnosis not present

## 2023-03-11 DIAGNOSIS — I739 Peripheral vascular disease, unspecified: Secondary | ICD-10-CM | POA: Diagnosis not present

## 2023-03-11 DIAGNOSIS — E1151 Type 2 diabetes mellitus with diabetic peripheral angiopathy without gangrene: Secondary | ICD-10-CM | POA: Diagnosis not present

## 2023-03-11 DIAGNOSIS — D649 Anemia, unspecified: Secondary | ICD-10-CM | POA: Diagnosis not present

## 2023-03-11 DIAGNOSIS — E11621 Type 2 diabetes mellitus with foot ulcer: Secondary | ICD-10-CM | POA: Diagnosis not present

## 2023-03-11 DIAGNOSIS — Z993 Dependence on wheelchair: Secondary | ICD-10-CM | POA: Diagnosis not present

## 2023-03-11 DIAGNOSIS — E1165 Type 2 diabetes mellitus with hyperglycemia: Secondary | ICD-10-CM | POA: Diagnosis not present

## 2023-03-11 DIAGNOSIS — Z111 Encounter for screening for respiratory tuberculosis: Secondary | ICD-10-CM | POA: Diagnosis not present

## 2023-03-11 DIAGNOSIS — L97419 Non-pressure chronic ulcer of right heel and midfoot with unspecified severity: Secondary | ICD-10-CM | POA: Diagnosis not present

## 2023-03-11 DIAGNOSIS — E559 Vitamin D deficiency, unspecified: Secondary | ICD-10-CM | POA: Diagnosis not present

## 2023-03-11 LAB — LAB REPORT - SCANNED
A1c: 7.3
EGFR: 38

## 2023-03-14 DIAGNOSIS — R5381 Other malaise: Secondary | ICD-10-CM | POA: Diagnosis not present

## 2023-03-14 DIAGNOSIS — I959 Hypotension, unspecified: Secondary | ICD-10-CM | POA: Diagnosis not present

## 2023-03-14 DIAGNOSIS — L89602 Pressure ulcer of unspecified heel, stage 2: Secondary | ICD-10-CM | POA: Diagnosis not present

## 2023-03-14 DIAGNOSIS — Z0000 Encounter for general adult medical examination without abnormal findings: Secondary | ICD-10-CM | POA: Diagnosis not present

## 2023-03-14 DIAGNOSIS — D649 Anemia, unspecified: Secondary | ICD-10-CM | POA: Diagnosis not present

## 2023-03-14 DIAGNOSIS — Z993 Dependence on wheelchair: Secondary | ICD-10-CM | POA: Diagnosis not present

## 2023-03-15 ENCOUNTER — Encounter (HOSPITAL_BASED_OUTPATIENT_CLINIC_OR_DEPARTMENT_OTHER): Payer: PPO | Admitting: Internal Medicine

## 2023-03-15 ENCOUNTER — Encounter: Payer: Self-pay | Admitting: Family Medicine

## 2023-03-15 DIAGNOSIS — F01518 Vascular dementia, unspecified severity, with other behavioral disturbance: Secondary | ICD-10-CM | POA: Diagnosis not present

## 2023-03-15 DIAGNOSIS — G309 Alzheimer's disease, unspecified: Secondary | ICD-10-CM | POA: Diagnosis not present

## 2023-03-15 DIAGNOSIS — D6859 Other primary thrombophilia: Secondary | ICD-10-CM | POA: Diagnosis not present

## 2023-03-15 DIAGNOSIS — I4891 Unspecified atrial fibrillation: Secondary | ICD-10-CM | POA: Diagnosis not present

## 2023-03-15 DIAGNOSIS — E039 Hypothyroidism, unspecified: Secondary | ICD-10-CM | POA: Diagnosis not present

## 2023-03-15 DIAGNOSIS — I70209 Unspecified atherosclerosis of native arteries of extremities, unspecified extremity: Secondary | ICD-10-CM | POA: Diagnosis not present

## 2023-03-15 DIAGNOSIS — Z794 Long term (current) use of insulin: Secondary | ICD-10-CM | POA: Diagnosis not present

## 2023-03-15 DIAGNOSIS — E1165 Type 2 diabetes mellitus with hyperglycemia: Secondary | ICD-10-CM | POA: Diagnosis not present

## 2023-03-15 DIAGNOSIS — E669 Obesity, unspecified: Secondary | ICD-10-CM | POA: Diagnosis not present

## 2023-03-15 DIAGNOSIS — F3342 Major depressive disorder, recurrent, in full remission: Secondary | ICD-10-CM | POA: Diagnosis not present

## 2023-03-15 DIAGNOSIS — L97329 Non-pressure chronic ulcer of left ankle with unspecified severity: Secondary | ICD-10-CM | POA: Diagnosis not present

## 2023-03-15 DIAGNOSIS — I700 Atherosclerosis of aorta: Secondary | ICD-10-CM | POA: Diagnosis not present

## 2023-03-15 NOTE — Telephone Encounter (Signed)
 Appt has been scheduled.

## 2023-03-16 ENCOUNTER — Other Ambulatory Visit: Payer: Self-pay | Admitting: *Deleted

## 2023-03-16 DIAGNOSIS — I70234 Atherosclerosis of native arteries of right leg with ulceration of heel and midfoot: Secondary | ICD-10-CM

## 2023-03-22 ENCOUNTER — Telehealth: Payer: Self-pay

## 2023-03-22 NOTE — Telephone Encounter (Signed)
Called patient , who was not at home. Dtr [name] [facility]) answered, asked her to have patient call me back to discuss recent labs and whether patient had a referral to GI. [name] is unsure and wants to check with patient. Asked patient to call back to let us know.

## 2023-03-22 NOTE — Telephone Encounter (Signed)
-----   Message from Armanda Magic sent at 03/21/2023  2:32 PM EDT ----- Patient is anemic - please find out if they referred her to GI - her Hbg is 9.8 and she is on anticoagulation ----- Message ----- From: Mickel Fuchs Sent: 03/15/2023   8:37 AM EDT To: Quintella Reichert, MD

## 2023-03-25 ENCOUNTER — Telehealth: Payer: Self-pay

## 2023-03-25 NOTE — Telephone Encounter (Signed)
Called to speak with patient about HGB, no answer. LMTCB per DPR>.

## 2023-03-25 NOTE — Telephone Encounter (Signed)
-----   Message from Armanda Magic sent at 03/21/2023  2:32 PM EDT ----- Patient is anemic - please find out if they referred her to GI - her Hbg is 9.8 and she is on anticoagulation ----- Message ----- From: Mickel Fuchs Sent: 03/15/2023   8:37 AM EDT To: Quintella Reichert, MD

## 2023-03-28 ENCOUNTER — Ambulatory Visit (HOSPITAL_COMMUNITY)
Admission: RE | Admit: 2023-03-28 | Discharge: 2023-03-28 | Disposition: A | Discharge status: Home / Self Care | Payer: PPO | Source: Ambulatory Visit | Attending: Vascular Surgery | Admitting: Vascular Surgery

## 2023-03-28 DIAGNOSIS — I70234 Atherosclerosis of native arteries of right leg with ulceration of heel and midfoot: Secondary | ICD-10-CM

## 2023-03-28 LAB — VAS US ABI WITH/WO TBI: Left ABI: 0.51

## 2023-03-28 NOTE — Progress Notes (Unsigned)
[facility] [place]  ASSESSMENT / PLAN: Tina Hester is a 82 y.o. female with atherosclerosis of native arteries of right lower extremity causing heel ulceration status post right femoropopliteal angioplasty and stenting 02/25/23.  Recommend the following which can slow the progression of atherosclerosis and reduce the risk of major adverse cardiac / limb events:  Complete cessation from all tobacco products. Blood glucose control with goal A1c < 7%. Blood pressure control with goal blood pressure < 140/90 mmHg. Lipid reduction therapy with goal LDL-C <100 mg/dL (<64 if symptomatic from PAD).  Aspirin 81mg  PO QD.  Clopidogrel 75mg  PO every day. Atorvastatin 40-80mg  PO QD (or other "high intensity" statin therapy).  Doing well overall. Heel ulcer is closing up. Will see her again in 6 months with ABI.  CHIEF COMPLAINT: right heel ulcer  HISTORY OF PRESENT ILLNESS: Tina Hester is a 82 y.o. female who presents to clinic for evaluation of right heel unstageable ulceration.  This has been present for several months.  The patient has neuropathy and no feeling in her feet.  This happened during an episode of lower extremity edema.  Her sister applied an elevating slipper which wore an ulcer on her right heel.  This is not healed since.  She does not walk enough to claudicate, but does transfer plan is able to do some activities of daily living.  03/29/23: Doing very well.  Her heel ulcer appears to be healing.  Her daughter is doing a good job taking care of her.  She is trying to get her walking is much as possible.   Past Medical History:  Diagnosis Date   Anemia    s/p Heme work -up normal EGD and colonscopy in 2012 per pt,neg SPEP   Anxiety    Aortic stenosis    Mild by echo 12/2021 with mean aortic valve gradient 17 mmHg   Bilateral carotid artery stenosis 06/08/2015   1-39% bilateral   Bradycardia    CKD (chronic kidney disease), stage III (HCC)     Diabetes mellitus without complication (HCC)    type 2   GERD (gastroesophageal reflux disease)    Gout    HSV-1 (herpes simplex virus 1) infection    Acyclovir prn   Hyperkalemia    Hyperlipidemia    Hypertension    Joint pain    osteoarthritis by Xray- possible erosion of R 4th MCP,elevated uric  acid ,ANA +    MCI (mild cognitive impairment)    Morbid obesity (HCC)    OSA on CPAP    Osteopenia    PAF (paroxysmal atrial fibrillation) (HCC)    Pain management    Neurosurg: Dr Odette Fraction   Pulmonary HTN (HCC)    Moderate by echo  2016. [facility]   TIA (transient ischemic attack)    remote history of TIA's early 2000    Past Surgical History:  Procedure Laterality Date   ABDOMINAL HYSTERECTOMY     APPENDECTOMY     BACK SURGERY     cervical fusion   BLADDER SURGERY     CESAREAN SECTION     CHOLECYSTECTOMY     LOWER EXTREMITY ANGIOGRAPHY N/A 02/25/2023   Procedure: Lower Extremity Angiography;  Surgeon: Leonie Douglas, MD;  Location: [facility] INVASIVE CV LAB;  Service: Cardiovascular;  Laterality: N/A;   PERIPHERAL VASCULAR INTERVENTION  02/25/2023   Procedure: PERIPHERAL VASCULAR INTERVENTION;  Surgeon: Leonie Douglas, MD;  Location: [facility] INVASIVE CV LAB;  Service: Cardiovascular;;  Family History  Problem Relation Age of Onset   Arrhythmia Brother     Social History   Socioeconomic History   Marital status: Divorced    Spouse name: Not on file   Number of children: 4   Years of education: Not on file   Highest education level: Some college, no degree  Occupational History   Not on file  Tobacco Use   Smoking status: Never   Smokeless tobacco: Never  Vaping Use   Vaping status: Never Used  Substance and Sexual Activity   Alcohol use: No   Drug use: No   Sexual activity: Not on file  Other Topics Concern   Not on file  Social History Narrative   10/27/21 lives with daughter, [name]   Some soda   Social Determinants of Health   Financial Resource  Strain: Not on file  Food Insecurity: No Food Insecurity (12/30/2022)   Hunger Vital Sign    Worried About Running Out of Food in the Last Year: Never true    Ran Out of Food in the Last Year: Never true  Transportation Needs: No Transportation Needs (12/30/2022)   PRAPARE - Administrator, Civil Service (Medical): No    Lack of Transportation (Non-Medical): No  Physical Activity: Not on file  Stress: Not on file  Social Connections: Unknown (01/11/2022)   Received from [facility]   Social Network    Social Network: Not on file  Intimate Partner Violence: Not At Risk (12/30/2022)   Humiliation, Afraid, Rape, and Kick questionnaire    Fear of Current or Ex-Partner: No    Emotionally Abused: No    Physically Abused: No    Sexually Abused: No    Allergies  Allergen Reactions   Pneumococcal Vaccine Anaphylaxis    weakness   Pneumovax 23 [Pneumococcal Vac Polyvalent] Anaphylaxis    weakness   Other Hives    Other reaction(s): Other (See Comments) Pneumonia  Vaccine- very ill   Sulfa Antibiotics Hives   Pregabalin     Other reaction(s): depression    Current Outpatient Medications  Medication Sig Dispense Refill   acetaminophen (TYLENOL) 325 MG tablet Take 500 mg by mouth every 4 (four) hours as needed for mild pain or moderate pain.     ALPRAZolam (XANAX) 0.25 MG tablet Take 1 tablet (0.25 mg total) by mouth at bedtime as needed for anxiety. 10 tablet 0   amLODipine (NORVASC) 5 MG tablet TAKE ONE TABLET BY MOUTH EVERY MORNING 90 tablet 3   aspirin EC 81 MG tablet Take 1 tablet (81 mg total) by mouth daily. Swallow whole. 150 tablet 2   atorvastatin (LIPITOR) 40 MG tablet TAKE ONE TABLET BY MOUTH EVERY EVENING 90 tablet 3   donepezil (ARICEPT) 5 MG tablet Take 5 mg by mouth daily.     dronedarone (MULTAQ) 400 MG tablet      ELIQUIS 5 MG TABS tablet TAKE ONE TABLET BY MOUTH TWICE DAILY 180 tablet 1   ezetimibe (ZETIA) 10 MG tablet Take 10 mg by mouth daily.      glipiZIDE (GLUCOTROL) 10 MG tablet Take 10 mg by mouth daily before breakfast.     insulin lispro (HUMALOG) 100 UNIT/ML injection Inject 1-7 Units into the skin 3 (three) times daily before meals. Sliding scale 141-180=1 unit,181-220= 2 units,221-260= 3 units,261-300=4 units, 301-340=5 units, 341-380=6 units, 381-400 = 7 units,> 400 call MD     Insulin NPH, Human,, Isophane, (NOVOLIN N FLEXPEN) 100 UNIT/ML Kiwkpen Inject  15 units at morning and 18 units at night 15 mL 2   metFORMIN (GLUCOPHAGE) 500 MG tablet TAKE ONE TABLET BY MOUTH EVERY MORNING and TAKE TWO TABLETS BY MOUTH EVERY EVENING 270 tablet 0   metoprolol tartrate (LOPRESSOR) 50 MG tablet Take 1 tablet (50 mg total) by mouth 2 (two) times daily. 180 tablet 3   olmesartan (BENICAR) 20 MG tablet Take 20 mg by mouth daily.     pioglitazone (ACTOS) 15 MG tablet Take 1 tablet (15 mg total) by mouth every morning. 90 tablet 0   sertraline (ZOLOFT) 50 MG tablet Take 50 mg by mouth daily.     No current facility-administered medications for this visit.    PHYSICAL EXAM Vitals:   03/29/23 0926  BP: 119/69  Pulse: 93  Resp: 20  Temp: 98 F (36.7 C)  SpO2: 98%  Weight: 200 lb (90.7 kg)  Height: 5\' 1"  (1.549 m)    Elderly obese woman in no acute distress.  In a wheelchair Regular rate and rhythm Unlabored breathing No palpable pedal pulses Left heel appears much more healthy.  There is a nickel sized superficial ulcer.  This appears to be healing well.   PERTINENT LABORATORY AND RADIOLOGIC DATA  Most recent CBC    Latest Ref Rng & Units 02/25/2023    6:29 AM 01/26/2023    8:30 AM 01/03/2023    5:15 AM  CBC  WBC 4.0 - 10.5 K/uL  7.6  6.0   Hemoglobin 12.0 - 15.0 g/dL 11.9  14.7  82.9   Hematocrit 36.0 - 46.0 % 36.0  35.7  32.3   Platelets 150 - 400 K/uL  173  224      Most recent CMP    Latest Ref Rng & Units 02/25/2023    6:29 AM 01/26/2023    8:30 AM 01/03/2023    5:15 AM  CMP  Glucose 70 - 99 mg/dL 562  92  130   BUN 8  - 23 mg/dL 33  30  17   Creatinine 0.44 - 1.00 mg/dL 8.65  7.84  6.96   Sodium 135 - 145 mmol/L 136  135  134   Potassium 3.5 - 5.1 mmol/L 4.4  4.4  3.8   Chloride 98 - 111 mmol/L 102  105  103   CO2 22 - 32 mmol/L  19  23   Calcium 8.9 - 10.3 mg/dL  8.6  8.5     Renal function CrCl cannot be calculated (Patient's most recent lab result is older than the maximum 21 days allowed.).  Hemoglobin A1C (no units)  Date Value  08/17/2022 8.7    LDL Chol Calc (NIH)  Date Value Ref Range Status  12/18/2021 86 0 - 99 mg/dL Final   Direct LDL  Date Value Ref Range Status  08/06/2020 81.0 mg/dL Final    Comment:    Optimal:  <100 mg/dLNear or Above Optimal:  100-129 mg/dLBorderline High:  130-159 mg/dLHigh:  160-189 mg/dLVery High:  >190 mg/dL    Angiogram 2/95/28: reviewed. Severe above knee popliteal stenosis. Above and behind knee popliteal occlusion. Good result from intervention with restoration of inline flow to the foot   +-------+-----------+-----------+------------+------------+  ABI/TBIToday's ABIToday's TBIPrevious ABIPrevious TBI  +-------+-----------+-----------+------------+------------+  Right [place]         0.47       1.04        0.54          +-------+-----------+-----------+------------+------------+  Left  0.51  0.15       1.26        0.39          +-------+-----------+-----------+------------+------------+   Right: Total occlusion noted in the anterior tibial artery. Patent stent  with no evidence of stenosis in the femoral-popliteal artery.    Rande Brunt. Lenell Antu, MD [facility] Phone Number: [phone] 03/28/2023 3:33 PM   Total time spent on preparing this encounter including chart review, data review, collecting history, examining the patient, coordinating care for this established patient, 30 minutes.  Portions of this report may have been transcribed using voice recognition software.  Every  effort has been made to ensure accuracy; however, inadvertent computerized transcription errors may still be present.

## 2023-03-29 ENCOUNTER — Ambulatory Visit (INDEPENDENT_AMBULATORY_CARE_PROVIDER_SITE_OTHER): Payer: PPO | Admitting: Vascular Surgery

## 2023-03-29 ENCOUNTER — Encounter: Payer: Self-pay | Admitting: Vascular Surgery

## 2023-03-29 VITALS — BP 119/69 | HR 93 | Temp 98.0°F | Resp 20 | Ht 61.0 in | Wt 200.0 lb

## 2023-03-29 DIAGNOSIS — I70234 Atherosclerosis of native arteries of right leg with ulceration of heel and midfoot: Secondary | ICD-10-CM | POA: Diagnosis not present

## 2023-03-30 ENCOUNTER — Other Ambulatory Visit: Payer: Self-pay

## 2023-03-30 DIAGNOSIS — I70234 Atherosclerosis of native arteries of right leg with ulceration of heel and midfoot: Secondary | ICD-10-CM

## 2023-04-02 DIAGNOSIS — M109 Gout, unspecified: Secondary | ICD-10-CM | POA: Diagnosis not present

## 2023-04-02 DIAGNOSIS — E559 Vitamin D deficiency, unspecified: Secondary | ICD-10-CM | POA: Diagnosis not present

## 2023-04-02 DIAGNOSIS — L89613 Pressure ulcer of right heel, stage 3: Secondary | ICD-10-CM | POA: Diagnosis not present

## 2023-04-02 DIAGNOSIS — Z7901 Long term (current) use of anticoagulants: Secondary | ICD-10-CM | POA: Diagnosis not present

## 2023-04-02 DIAGNOSIS — K219 Gastro-esophageal reflux disease without esophagitis: Secondary | ICD-10-CM | POA: Diagnosis not present

## 2023-04-02 DIAGNOSIS — I4819 Other persistent atrial fibrillation: Secondary | ICD-10-CM | POA: Diagnosis not present

## 2023-04-02 DIAGNOSIS — I10 Essential (primary) hypertension: Secondary | ICD-10-CM | POA: Diagnosis not present

## 2023-04-02 DIAGNOSIS — F419 Anxiety disorder, unspecified: Secondary | ICD-10-CM | POA: Diagnosis not present

## 2023-04-02 DIAGNOSIS — E538 Deficiency of other specified B group vitamins: Secondary | ICD-10-CM | POA: Diagnosis not present

## 2023-04-02 DIAGNOSIS — Z794 Long term (current) use of insulin: Secondary | ICD-10-CM | POA: Diagnosis not present

## 2023-04-02 DIAGNOSIS — K5900 Constipation, unspecified: Secondary | ICD-10-CM | POA: Diagnosis not present

## 2023-04-02 DIAGNOSIS — Z556 Problems related to health literacy: Secondary | ICD-10-CM | POA: Diagnosis not present

## 2023-04-02 DIAGNOSIS — F329 Major depressive disorder, single episode, unspecified: Secondary | ICD-10-CM | POA: Diagnosis not present

## 2023-04-02 DIAGNOSIS — Z7984 Long term (current) use of oral hypoglycemic drugs: Secondary | ICD-10-CM | POA: Diagnosis not present

## 2023-04-02 DIAGNOSIS — F0280 Dementia in other diseases classified elsewhere without behavioral disturbance: Secondary | ICD-10-CM | POA: Diagnosis not present

## 2023-04-02 DIAGNOSIS — E039 Hypothyroidism, unspecified: Secondary | ICD-10-CM | POA: Diagnosis not present

## 2023-04-02 DIAGNOSIS — E1140 Type 2 diabetes mellitus with diabetic neuropathy, unspecified: Secondary | ICD-10-CM | POA: Diagnosis not present

## 2023-04-02 DIAGNOSIS — Z7982 Long term (current) use of aspirin: Secondary | ICD-10-CM | POA: Diagnosis not present

## 2023-04-02 DIAGNOSIS — G8929 Other chronic pain: Secondary | ICD-10-CM | POA: Diagnosis not present

## 2023-04-02 DIAGNOSIS — R32 Unspecified urinary incontinence: Secondary | ICD-10-CM | POA: Diagnosis not present

## 2023-04-02 DIAGNOSIS — E782 Mixed hyperlipidemia: Secondary | ICD-10-CM | POA: Diagnosis not present

## 2023-04-15 ENCOUNTER — Telehealth: Payer: Self-pay

## 2023-04-15 NOTE — Telephone Encounter (Signed)
-----   Message from Armanda Magic sent at 03/21/2023  2:32 PM EDT ----- Patient is anemic - please find out if they referred her to GI - her Hbg is 9.8 and she is on anticoagulation ----- Message ----- From: Mickel Fuchs Sent: 03/15/2023   8:37 AM EDT To: Quintella Reichert, MD

## 2023-04-15 NOTE — Telephone Encounter (Signed)
Spoke to patient's dtr [name] per DPR. [name] states she is aware of patient's recent HGB of 9.8 on 03/11/23. She states that PCP had given patient iron supplementation but no GI referral had been made although she had discussed it with Dr. Paulino Rily. [name] states he mother has not complained of any pale color, syncope or lightheadedness. Advised dtr [name] to call Dr. Paulino Rily and discuss 1) lab recheck and 2) GI referral as patient is on eliquis which can worsen bleeding. [name] verbalizes understanding to call PCP to get patient in with GI ASAP and to call 911 if patient has any syncope. Advised [name] not to let patient get up by herself over the weekend as falls can lead to serious bleeding when on anticoagulants and excess bleeding is present. [name] agrees to plan.

## 2023-04-19 ENCOUNTER — Encounter (HOSPITAL_BASED_OUTPATIENT_CLINIC_OR_DEPARTMENT_OTHER): Payer: PPO | Attending: Internal Medicine | Admitting: Internal Medicine

## 2023-04-19 DIAGNOSIS — G4730 Sleep apnea, unspecified: Secondary | ICD-10-CM | POA: Insufficient documentation

## 2023-04-19 DIAGNOSIS — Z7901 Long term (current) use of anticoagulants: Secondary | ICD-10-CM | POA: Insufficient documentation

## 2023-04-19 DIAGNOSIS — E1151 Type 2 diabetes mellitus with diabetic peripheral angiopathy without gangrene: Secondary | ICD-10-CM | POA: Insufficient documentation

## 2023-04-19 DIAGNOSIS — I129 Hypertensive chronic kidney disease with stage 1 through stage 4 chronic kidney disease, or unspecified chronic kidney disease: Secondary | ICD-10-CM | POA: Diagnosis not present

## 2023-04-19 DIAGNOSIS — L89610 Pressure ulcer of right heel, unstageable: Secondary | ICD-10-CM | POA: Diagnosis present

## 2023-04-19 DIAGNOSIS — E11621 Type 2 diabetes mellitus with foot ulcer: Secondary | ICD-10-CM | POA: Insufficient documentation

## 2023-04-19 DIAGNOSIS — E1122 Type 2 diabetes mellitus with diabetic chronic kidney disease: Secondary | ICD-10-CM | POA: Insufficient documentation

## 2023-04-19 DIAGNOSIS — N183 Chronic kidney disease, stage 3 unspecified: Secondary | ICD-10-CM | POA: Insufficient documentation

## 2023-04-20 ENCOUNTER — Ambulatory Visit (INDEPENDENT_AMBULATORY_CARE_PROVIDER_SITE_OTHER): Payer: PPO | Admitting: Podiatry

## 2023-04-20 DIAGNOSIS — Z91199 Patient's noncompliance with other medical treatment and regimen due to unspecified reason: Secondary | ICD-10-CM

## 2023-04-20 NOTE — Progress Notes (Signed)
No show

## 2023-04-20 NOTE — Progress Notes (Signed)
ELGA, DEUSCHLE ([id number]) [id number] Page 1 of 8 Visit Report for 04/19/2023 Chief Complaint Document Details Patient Name: Date of Service: Tina Hester 04/19/2023 1:15 PM Medical Record Number: [id number] Patient Account Number: [id number] Date of Birth/Sex: Treating RN: 16-Mar-1941 (82 y.o. F) Primary Care Provider: [name] Other Clinician: Referring Provider: Treating Provider/Extender: Burman Riis in Treatment: 6 Information Obtained from: Patient Chief Complaint 03/08/2023; Right heel wound Electronic Signature(s) Signed: 04/19/2023 2:47:41 PM By: Geralyn Corwin DO Entered By: Geralyn Corwin on 04/19/2023 11:23:36 -------------------------------------------------------------------------------- Debridement Details Patient Name: Date of Service: Tina Hester, Tina BERTA N. 04/19/2023 1:15 PM Medical Record Number: [id number] Patient Account Number: [id number] Date of Birth/Sex: Treating RN: 16-Mar-1941 (82 y.o. Tina Hester, Tina Hester Primary Care Provider: [name] Other Clinician: Referring Provider: Treating Provider/Extender: Burman Riis in Treatment: 6 Debridement Performed for Assessment: Wound #1 Right Calcaneus Performed By: Physician Geralyn Corwin, DO Debridement Type: Debridement Severity of Tissue Pre Debridement: Fat layer exposed Level of Consciousness (Pre-procedure): Awake and Alert Pre-procedure Verification/Time Out Yes - 14:00 Taken: Start Time: 14:01 Pain Control: Lidocaine 4% T opical Solution Percent of Wound Bed Debrided: 100% T Area Debrided (cm): otal 2.2 Tissue and other material debrided: Viable, Non-Viable, Slough, Subcutaneous, Skin: Dermis , Skin: Epidermis, Biofilm, Slough, Hyper-granulation Level: Skin/Subcutaneous Tissue Debridement Description: Excisional Instrument: Curette Bleeding: Minimum Hemostasis Achieved: Silver Nitrate End  Time: 14:27 Procedural Pain: 0 Post Procedural Pain: 0 Response to Treatment: Procedure was tolerated well Level of Consciousness (Post- Awake and Alert procedure): Post Debridement Measurements of Total Wound Length: (cm) 2 Width: (cm) 1.4 Depth: (cm) 0.2 Volume: (cm) 0.44 Character of Wound/Ulcer Post Debridement: Requires Further Debridement Tina Hester, Tina Hester ([id number]) [id number] Page 2 of 8 Severity of Tissue Post Debridement: Fat layer exposed Post Procedure Diagnosis Same as Pre-procedure Electronic Signature(s) Signed: 04/19/2023 2:47:41 PM By: Geralyn Corwin DO Signed: 04/19/2023 5:13:11 PM By: Shawn Stall RN, BSN Entered By: Shawn Stall on 04/19/2023 11:27:53 -------------------------------------------------------------------------------- HPI Details Patient Name: Date of Service: Tina Hester, Tina BERTA N. 04/19/2023 1:15 PM Medical Record Number: [id number] Patient Account Number: [id number] Date of Birth/Sex: Treating RN: 05-Jan-1941 (82 y.o. F) Primary Care Provider: [name] Other Clinician: Referring Provider: Treating Provider/Extender: Burman Riis in Treatment: 6 History of Present Illness HPI Description: 03/08/2023 Ms. Man Poyser is an 82 year old female with a past medical history of uncontrolled insulin-dependent type 2 diabetes, peripheral arterial disease status post revascularization, atrial fibrillation on Eliquis and hypertension that presents the clinic for a 59-month history of nonhealing wound to the right heel. She was residing in [place] at a nursing facility when she developed a pressure injury. She recently moved in with her daughter a couple months ago here in [place]. She visited the [facility], ED on 12/30/2022 for left foot pain and was noted to have a pressure injury at that time. She had an MRI of the right foot that did not show evidence of osteomyelitis. She had ABIs that showed an  ABI of 1.04 on the right with a TBI of 0.54 and monophasic waveforms. She had a right lower extremity angiogram on 6/28 with right femoral-popliteal angioplasty and stenting. It is not clear when the pressure injury became an open wound. However for the past 1-2 months she has been using Betadine to the wound bed. She currently denies signs of infection. She is wearing shoes with open back. 8/20; patient missed her last appointment. I have not seen  her since 7/9. She has been using Santyl to the wound bed. She is using a Manufacturing engineer at night. She followed up with vein and vascular on 7/30 who noted that the wound appeared well-healing and there were no concerns. She will be doing repeat ABIs in 6 months. Electronic Signature(s) Signed: 04/19/2023 2:47:41 PM By: Geralyn Corwin DO Entered By: Geralyn Corwin on 04/19/2023 11:26:16 -------------------------------------------------------------------------------- Physical Exam Details Patient Name: Date of Service: Tina Hester 04/19/2023 1:15 PM Medical Record Number: [id number] Patient Account Number: [id number] Date of Birth/Sex: Treating RN: [birth year] (82 y.o. F) Primary Care Provider: [name] Other Clinician: Referring Provider: Treating Provider/Extender: Burman Riis in Treatment: 6 Constitutional respirations regular, non-labored and within target range for patient.. Cardiovascular 2+ dorsalis pedis/posterior tibialis pulses. Psychiatric pleasant and cooperative. Notes Tina Hester, Tina Hester ([id number]) [id number] Page 3 of 8 Right foot: T the posterior Herald there is an open wound with granulation tissue, hyper granulated areas and nonviable tissue. o Electronic Signature(s) Signed: 04/19/2023 2:47:41 PM By: Geralyn Corwin DO Entered By: Geralyn Corwin on 04/19/2023  11:26:57 -------------------------------------------------------------------------------- Physician Orders Details Patient Name: Date of Service: Tina Hester, Tina BERTA N. 04/19/2023 1:15 PM Medical Record Number: [id number] Patient Account Number: [id number] Date of Birth/Sex: Treating RN: 01-25-1941 (82 y.o. [name] Primary Care Provider: [name] Other Clinician: Referring Provider: Treating Provider/Extender: Burman Riis in Treatment: 6 Verbal / Phone Orders: No Diagnosis Coding Follow-up Appointments ppointment in 2 weeks. - Dr. Mikey Bussing Thursday 145pm 05/05/2023 Return A Anesthetic (In clinic) Topical Lidocaine 4% applied to wound bed Bathing/ Shower/ Hygiene May shower and wash wound with soap and water. Edema Control - Lymphedema / SCD / Other Elevate legs to the level of the heart or above for 30 minutes daily and/or when sitting for 3-4 times a day throughout the day. Avoid standing for long periods of time. Off-Loading Prevalon Boot - continue to use to both heels for protection and allowing to heal especially while sitting in chair or bed. Home Health New wound care orders this week; continue Home Health for wound care. May utilize formulary equivalent dressing for wound treatment orders unless otherwise specified. - Home health to change weekly. All other days family to change dressing. Adding hydrofera Blue to dressing over santyl. Other Home Health Orders/Instructions: - [facility] Wound Treatment Wound #1 - Calcaneus Wound Laterality: Right Cleanser: Vashe 5.8 (oz) (Home Health) 1 x Per Day/30 Days Discharge Instructions: Cleanse the wound with Vashe prior to applying a clean dressing using gauze sponges, not tissue or cotton balls. Prim Dressing: Hydrofera Blue Ready Transfer Foam, 2.5x2.5 (in/in) 1 x Per Day/30 Days ary Discharge Instructions: Apply over the santyl to wound bed as directed Prim Dressing: Santyl  Ointment 1 x Per Day/30 Days ary Discharge Instructions: Apply nickel thick amount to wound bed as instructed. Medihoney in place of santyl until arrives. Secondary Dressing: ALLEVYN Heel 4 1/2in x 5 1/2in / 10.5cm x 13.5cm (Home Health) 1 x Per Day/30 Days Discharge Instructions: Apply over primary dressing as directed. Secondary Dressing: Woven Gauze Sponge, Non-Sterile 4x4 in (Home Health) 1 x Per Day/30 Days Discharge Instructions: Apply over primary dressing as directed. Secured With: Web designer, Sterile 4x75 (in/in) (Home Health) 1 x Per Day/30 Days Discharge Instructions: Secure with stretch gauze as directed. Secured With: 57M Medipore H Soft Cloth Surgical T ape, 4 x 10 (in/yd) (Home Health) 1 x Per Day/30 Days  Discharge Instructions: Secure with tape as directed. Secured With: Stretch net 1 x Per Day/30 Days Discharge Instructions: to secure dressing in place. Electronic Signature(s) Tina Hester, Tina Hester ([id number]) [id number] Page 4 of 8 Signed: 04/19/2023 2:47:41 PM By: Geralyn Corwin DO Entered By: Geralyn Corwin on 04/19/2023 11:27:05 -------------------------------------------------------------------------------- Problem List Details Patient Name: Date of Service: Tina Hester, Tina Hester 04/19/2023 1:15 PM Medical Record Number: [id number] Patient Account Number: [id number] Date of Birth/Sex: Treating RN: [birth year] (82 y.o. F) Primary Care Provider: [name] Other Clinician: Referring Provider: Treating Provider/Extender: Burman Riis in Treatment: 6 Active Problems ICD-10 Encounter Code Description Active Date MDM Diagnosis L89.610 Pressure ulcer of right heel, unstageable 03/08/2023 No Yes E11.621 Type 2 diabetes mellitus with foot ulcer 03/08/2023 No Yes I10 Essential (primary) hypertension 03/08/2023 No Yes I73.9 Peripheral vascular disease, unspecified 03/08/2023 No Yes G47.30  Sleep apnea, unspecified 03/08/2023 No Yes Inactive Problems Resolved Problems Electronic Signature(s) Signed: 04/19/2023 2:47:41 PM By: Geralyn Corwin DO Entered By: Geralyn Corwin on 04/19/2023 11:23:21 -------------------------------------------------------------------------------- Progress Note Details Patient Name: Date of Service: Tina Cash N. 04/19/2023 1:15 PM Medical Record Number: [id number] Patient Account Number: [id number] Date of Birth/Sex: Treating RN: 1941/03/24 (82 y.o. F) Primary Care Provider: [name] Other Clinician: Referring Provider: Treating Provider/Extender: Burman Riis in Treatment: 6 Subjective Chief Complaint LAKYA, SIMENTAL ([id number]) [id number] Page 5 of 8 Information obtained from Patient 03/08/2023; Right heel wound History of Present Illness (HPI) 03/08/2023 Ms. Dyonne Zirkelbach is an 82 year old female with a past medical history of uncontrolled insulin-dependent type 2 diabetes, peripheral arterial disease status post revascularization, atrial fibrillation on Eliquis and hypertension that presents the clinic for a 35-month history of nonhealing wound to the right heel. She was residing in [place] at a nursing facility when she developed a pressure injury. She recently moved in with her daughter a couple months ago here in [place]. She visited the [facility], ED on 12/30/2022 for left foot pain and was noted to have a pressure injury at that time. She had an MRI of the right foot that did not show evidence of osteomyelitis. She had ABIs that showed an ABI of 1.04 on the right with a TBI of 0.54 and monophasic waveforms. She had a right lower extremity angiogram on 6/28 with right femoral-popliteal angioplasty and stenting. It is not clear when the pressure injury became an open wound. However for the past 1-2 months she has been using Betadine to the wound bed. She currently denies signs  of infection. She is wearing shoes with open back. 8/20; patient missed her last appointment. I have not seen her since 7/9. She has been using Santyl to the wound bed. She is using a Manufacturing engineer at night. She followed up with vein and vascular on 7/30 who noted that the wound appeared well-healing and there were no concerns. She will be doing repeat ABIs in 6 months. Patient History Information obtained from Patient, Caregiver, Chart. Family History Heart Disease - Siblings. Social History Never smoker, Marital Status - Divorced, Alcohol Use - Never, Drug Use - No History, Caffeine Use - Daily - soda. Medical History Hematologic/Lymphatic Patient has history of Anemia Respiratory Patient has history of Sleep Apnea - CPAP- does not wear Cardiovascular Patient has history of Arrhythmia - A.fib, Hypertension, Peripheral Arterial Disease Endocrine Patient has history of Type II Diabetes Musculoskeletal Patient has history of Gout Hospitalization/Surgery History - 02/25/2023 angiography. - hysterectomy. - appendectomy. - back  surgery. - bladder surgery. - cholecystectomy. - c-section. Medical A Surgical History Notes nd Constitutional Symptoms (General Health) TIA morbid obesity mild cognitive impairment Herpes simplex 1 Respiratory Pulmonary HTN Cardiovascular Aortic stenosis Gastrointestinal GERD Genitourinary CKD stage III Psychiatric anxiety Objective Constitutional respirations regular, non-labored and within target range for patient.. Vitals Time Taken: 1:42 PM, Height: 61 in, Weight: 200 lbs, BMI: 37.8, Temperature: 97.8 F, Pulse: 68 bpm, Respiratory Rate: 18 breaths/min, Blood Pressure: 133/73 mmHg. Cardiovascular 2+ dorsalis pedis/posterior tibialis pulses. Psychiatric pleasant and cooperative. General Notes: Right foot: T the posterior Herald there is an open wound with granulation tissue, hyper granulated areas and nonviable tissue. o Integumentary (Hester,  Skin) Wound #1 status is Open. Original cause of wound was Pressure Injury. The date acquired was: 10/29/2022. The wound has been in treatment 6 weeks. The wound is located on the Right Calcaneus. The wound measures 2cm length x 1.4cm width x 0.2cm depth; 2.199cm^2 area and 0.44cm^3 volume. There is no tunneling or undermining noted. There is a medium amount of serosanguineous drainage noted. The wound margin is distinct with the outline attached to the wound base. There is no granulation within the wound bed. There is a large (67-100%) amount of necrotic tissue within the wound bed including Eschar and Adherent Slough. The periwound skin appearance did not exhibit: Callus, Crepitus, Excoriation, Induration, Rash, Scarring, Dry/Scaly, Maceration, Atrophie Blanche, Cyanosis, Ecchymosis, Hemosiderin Staining, Mottled, Pallor, Rubor, Erythema. Tina Hester, Tina Hester ([id number]) [id number] Page 6 of 8 Assessment Active Problems ICD-10 Pressure ulcer of right heel, unstageable Type 2 diabetes mellitus with foot ulcer Essential (primary) hypertension Peripheral vascular disease, unspecified Sleep apnea, unspecified Patient is status post right femoral-popliteal angioplasty and stenting on 02/25/2023. Patient's wound has shown improvement in size and appearance since last clinic visit. I debrided nonviable tissue. I used silver nitrate to the hyper granulated areas. I recommended continuing with Santyl but will add Hydrofera Blue. Continue aggressive offloading. Follow-up in 2 weeks. Plan Follow-up Appointments: Return Appointment in 2 weeks. - Dr. Mikey Bussing Thursday 145pm 05/05/2023 Anesthetic: (In clinic) Topical Lidocaine 4% applied to wound bed Bathing/ Shower/ Hygiene: May shower and wash wound with soap and water. Edema Control - Lymphedema / SCD / Other: Elevate legs to the level of the heart or above for 30 minutes daily and/or when sitting for 3-4 times a day  throughout the day. Avoid standing for long periods of time. Off-Loading: Prevalon Boot - continue to use to both heels for protection and allowing to heal especially while sitting in chair or bed. Home Health: New wound care orders this week; continue Home Health for wound care. May utilize formulary equivalent dressing for wound treatment orders unless otherwise specified. - Home health to change weekly. All other days family to change dressing. Adding hydrofera Blue to dressing over santyl. Other Home Health Orders/Instructions: - [facility] WOUND #1: - Calcaneus Wound Laterality: Right Cleanser: Vashe 5.8 (oz) (Home Health) 1 x Per Day/30 Days Discharge Instructions: Cleanse the wound with Vashe prior to applying a clean dressing using gauze sponges, not tissue or cotton balls. Prim Dressing: Hydrofera Blue Ready Transfer Foam, 2.5x2.5 (in/in) 1 x Per Day/30 Days ary Discharge Instructions: Apply over the santyl to wound bed as directed Prim Dressing: Santyl Ointment 1 x Per Day/30 Days ary Discharge Instructions: Apply nickel thick amount to wound bed as instructed. Medihoney in place of santyl until arrives. Secondary Dressing: ALLEVYN Heel 4 1/2in x 5 1/2in / 10.5cm x 13.5cm (Home Health) 1 x  Per Day/30 Days Discharge Instructions: Apply over primary dressing as directed. Secondary Dressing: Woven Gauze Sponge, Non-Sterile 4x4 in (Home Health) 1 x Per Day/30 Days Discharge Instructions: Apply over primary dressing as directed. Secured With: Web designer, Sterile 4x75 (in/in) (Home Health) 1 x Per Day/30 Days Discharge Instructions: Secure with stretch gauze as directed. Secured With: 35M Medipore H Soft Cloth Surgical T ape, 4 x 10 (in/yd) (Home Health) 1 x Per Day/30 Days Discharge Instructions: Secure with tape as directed. Secured With: Stretch net 1 x Per Day/30 Days Discharge Instructions: to secure dressing in place. 1. In office sharp  debridement 2. Silver nitrate 3. Santyl and Hydrofera Blue 4. Aggressive offloadingPrevalon boot 5. Follow-up in 2 weeks Electronic Signature(s) Signed: 04/19/2023 2:47:41 PM By: Geralyn Corwin DO Entered By: Geralyn Corwin on 04/19/2023 11:29:13 -------------------------------------------------------------------------------- HxROS Details Patient Name: Date of Service: Tina Hester, Tina BERTA N. 04/19/2023 1:15 PM Medical Record Number: [id number] Patient Account Number: [id number] Date of Birth/Sex: Treating RN: 12-04-1940 (82 y.o. Tina Hester, Tina Hester ([id number]) [id number] Page 7 of 8 Primary Care Provider: [name] Other Clinician: Referring Provider: Treating Provider/Extender: Burman Riis in Treatment: 6 Information Obtained From Patient Caregiver Chart Constitutional Symptoms (General Health) Medical History: Past Medical History Notes: TIA morbid obesity mild cognitive impairment Herpes simplex 1 Hematologic/Lymphatic Medical History: Positive for: Anemia Respiratory Medical History: Positive for: Sleep Apnea - CPAP- does not wear Past Medical History Notes: Pulmonary HTN Cardiovascular Medical History: Positive for: Arrhythmia - A.fib; Hypertension; Peripheral Arterial Disease Past Medical History Notes: Aortic stenosis Gastrointestinal Medical History: Past Medical History Notes: GERD Endocrine Medical History: Positive for: Type II Diabetes Time with diabetes: 30 years Treated with: Insulin Blood sugar tested every day: Yes Tested : Genitourinary Medical History: Past Medical History Notes: CKD stage III Musculoskeletal Medical History: Positive for: Gout Psychiatric Medical History: Past Medical History Notes: anxiety Immunizations Pneumococcal Vaccine: Received Pneumococcal Vaccination: Yes Received Pneumococcal Vaccination On or After 60th Birthday: No Implantable  Devices No devices added Hospitalization / Surgery History Type of Hospitalization/Surgery 02/25/2023 angiography KYLEEN, THIEN ([id number]) [id number] Page 8 of 8 hysterectomy appendectomy back surgery bladder surgery cholecystectomy c-section Family and Social History Heart Disease: Yes - Siblings; Never smoker; Marital Status - Divorced; Alcohol Use: Never; Drug Use: No History; Caffeine Use: Daily - soda; Financial Concerns: No; Food, Clothing or Shelter Needs: No; Support System Lacking: No; Transportation Concerns: No Electronic Signature(s) Signed: 04/19/2023 2:47:41 PM By: Geralyn Corwin DO Entered By: Geralyn Corwin on 04/19/2023 11:26:27 -------------------------------------------------------------------------------- SuperBill Details Patient Name: Date of Service: Tina Hester 04/19/2023 Medical Record Number: [id number] Patient Account Number: [id number] Date of Birth/Sex: Treating RN: April 11, 1941 (82 y.o. Tina Hester, Tina Hester Primary Care Provider: [name] Other Clinician: Referring Provider: Treating Provider/Extender: Burman Riis in Treatment: 6 Diagnosis Coding ICD-10 Codes Code Description L89.610 Pressure ulcer of right heel, unstageable E11.621 Type 2 diabetes mellitus with foot ulcer I10 Essential (primary) hypertension I73.9 Peripheral vascular disease, unspecified G47.30 Sleep apnea, unspecified Facility Procedures : CPT4 Code: [id number] Description: 11042 - DEB SUBQ TISSUE 20 SQ CM/< ICD-10 Diagnosis Description L89.610 Pressure ulcer of right heel, unstageable E11.621 Type 2 diabetes mellitus with foot ulcer Modifier: Quantity: 1 Physician Procedures : CPT4 Code Description Modifier [id number] 11042 - WC PHYS SUBQ TISS 20 SQ CM ICD-10 Diagnosis Description L89.610 Pressure ulcer of right heel, unstageable E11.621 Type 2 diabetes mellitus with foot ulcer Quantity: 1 Electronic  Signature(s) Signed:  04/19/2023 2:47:41 PM By: Geralyn Corwin DO Entered By: Geralyn Corwin on 04/19/2023 11:29:22

## 2023-04-26 NOTE — Progress Notes (Addendum)
Tina Hester in Treatment: 6 Wound Status Wound Number: 1 Primary Diabetic Wound/Ulcer of the Lower Extremity Etiology: Wound Location: Right Calcaneus Secondary Pressure Ulcer Wounding Event: Pressure Injury Etiology: Date Acquired: 10/29/2022 Wound Open Hester Of Treatment: 6 Status: Clustered Wound: No Comorbid Anemia, Sleep  Apnea, Arrhythmia, Hypertension, Peripheral History: Arterial Disease, Type II Diabetes, Gout Photos Wound Measurements Length: (cm) 2 Width: (cm) 1.4 Depth: (cm) 0.2 Area: (cm) 2.199 Volume: (cm) 0.44 % Reduction in Area: 13% % Reduction in Volume: 13% Epithelialization: None Tunneling: No Undermining: No Wound Description Tina Hester, Tina Hester ([id number]) Classification: Grade 2 Wound Margin: Distinct, outline attached Exudate Amount: Medium Exudate Type: Serosanguineous Exudate Color: red, brown [id number] Page 6 of 6 Foul Odor After Cleansing: No Slough/Fibrino Yes Wound Bed Granulation Amount: None Present (0%) Exposed Structure Necrotic Amount: Large (67-100%) Fascia Exposed: No Necrotic Quality: Eschar, Adherent Slough Fat Layer (Subcutaneous Tissue) Exposed: No Tendon Exposed: No Muscle Exposed: No Joint Exposed: No Bone Exposed: No Periwound Skin Texture Texture Color No Abnormalities Noted: No No Abnormalities Noted: No Callus: No Atrophie Blanche: No Crepitus: No Cyanosis: No Excoriation: No Ecchymosis: No Induration: No Erythema: No Rash: No Hemosiderin Staining: No Scarring: No Mottled: No Pallor: No Moisture Rubor: No No Abnormalities Noted: No Dry / Scaly: No Maceration: No Electronic Signature(s) Signed: 04/26/2023 2:03:55 PM By: Brenton Grills Entered By: Brenton Grills on 04/19/2023 13:50:29 -------------------------------------------------------------------------------- Vitals Details Patient Name: Date of Service: Tina Hester, Tina BERTA N. 04/19/2023 1:15 PM Medical Record Number: [id number] Patient Account Number: [id number] Date of Birth/Sex: Treating RN: 07-13-1941 (82 y.o. Gevena Mart Primary Care Shahidah Nesbitt: [name] Other Clinician: Referring Galit Urich: Treating Royalty Fakhouri/Extender: Burman Riis in Treatment: 6 Vital Signs Time Taken: 13:42 Temperature (F):  97.8 Height (in): 61 Pulse (bpm): 68 Weight (lbs): 200 Respiratory Rate (breaths/min): 18 Body Mass Index (BMI): 37.8 Blood Pressure (mmHg): 133/73 Reference Range: 80 - 120 mg / dl Electronic Signature(s) Signed: 04/26/2023 2:03:55 PM By: Brenton Grills Entered By: Brenton Grills on 04/19/2023 13:43:34  Tina Hester in Treatment: 6 Wound Status Wound Number: 1 Primary Diabetic Wound/Ulcer of the Lower Extremity Etiology: Wound Location: Right Calcaneus Secondary Pressure Ulcer Wounding Event: Pressure Injury Etiology: Date Acquired: 10/29/2022 Wound Open Hester Of Treatment: 6 Status: Clustered Wound: No Comorbid Anemia, Sleep  Apnea, Arrhythmia, Hypertension, Peripheral History: Arterial Disease, Type II Diabetes, Gout Photos Wound Measurements Length: (cm) 2 Width: (cm) 1.4 Depth: (cm) 0.2 Area: (cm) 2.199 Volume: (cm) 0.44 % Reduction in Area: 13% % Reduction in Volume: 13% Epithelialization: None Tunneling: No Undermining: No Wound Description Tina Hester, Tina Hester ([id number]) Classification: Grade 2 Wound Margin: Distinct, outline attached Exudate Amount: Medium Exudate Type: Serosanguineous Exudate Color: red, brown [id number] Page 6 of 6 Foul Odor After Cleansing: No Slough/Fibrino Yes Wound Bed Granulation Amount: None Present (0%) Exposed Structure Necrotic Amount: Large (67-100%) Fascia Exposed: No Necrotic Quality: Eschar, Adherent Slough Fat Layer (Subcutaneous Tissue) Exposed: No Tendon Exposed: No Muscle Exposed: No Joint Exposed: No Bone Exposed: No Periwound Skin Texture Texture Color No Abnormalities Noted: No No Abnormalities Noted: No Callus: No Atrophie Blanche: No Crepitus: No Cyanosis: No Excoriation: No Ecchymosis: No Induration: No Erythema: No Rash: No Hemosiderin Staining: No Scarring: No Mottled: No Pallor: No Moisture Rubor: No No Abnormalities Noted: No Dry / Scaly: No Maceration: No Electronic Signature(s) Signed: 04/26/2023 2:03:55 PM By: Brenton Grills Entered By: Brenton Grills on 04/19/2023 13:50:29 -------------------------------------------------------------------------------- Vitals Details Patient Name: Date of Service: Tina Hester, Tina BERTA N. 04/19/2023 1:15 PM Medical Record Number: [id number] Patient Account Number: [id number] Date of Birth/Sex: Treating RN: 07-13-1941 (82 y.o. Gevena Mart Primary Care Shahidah Nesbitt: [name] Other Clinician: Referring Galit Urich: Treating Royalty Fakhouri/Extender: Burman Riis in Treatment: 6 Vital Signs Time Taken: 13:42 Temperature (F):  97.8 Height (in): 61 Pulse (bpm): 68 Weight (lbs): 200 Respiratory Rate (breaths/min): 18 Body Mass Index (BMI): 37.8 Blood Pressure (mmHg): 133/73 Reference Range: 80 - 120 mg / dl Electronic Signature(s) Signed: 04/26/2023 2:03:55 PM By: Brenton Grills Entered By: Brenton Grills on 04/19/2023 13:43:34  1:Photos:] [N/Tina:N/Tina] Right Calcaneus N/Tina N/Tina Wound Location: Pressure Injury N/Tina N/Tina Wounding Event: Diabetic Wound/Ulcer of the Lower N/Tina N/Tina Primary Etiology: Extremity Pressure Ulcer N/Tina N/Tina Secondary Etiology: Anemia, Sleep Apnea, Arrhythmia, N/Tina N/Tina Comorbid History: Hypertension, Peripheral Arterial Disease, Type II Diabetes, Gout 10/29/2022 N/Tina N/Tina Date Acquired: 6 N/Tina N/Tina Hester of Treatment: Open N/Tina N/Tina Wound Status: No N/Tina N/Tina Wound Recurrence: 2x1.4x0.2 N/Tina N/Tina Measurements L x W x D (cm) 2.199 N/Tina N/Tina Tina (cm) : rea 0.44 N/Tina N/Tina Volume (cm) : 13.00% N/Tina N/Tina % Reduction in Tina rea: 13.00% N/Tina N/Tina % Reduction in Volume: Grade 2 N/Tina N/Tina Classification: Medium N/Tina N/Tina Exudate Tina mount: Serosanguineous N/Tina N/Tina Exudate Type: red, brown N/Tina N/Tina Exudate Color: Distinct, outline attached N/Tina N/Tina Wound Margin: None Present (0%) N/Tina N/Tina Granulation Tina mount: Large (67-100%) N/Tina N/Tina Necrotic Tina mount: Eschar, Adherent Slough N/Tina N/Tina Necrotic Tissue: Fascia: No N/Tina N/Tina Exposed Structures: Fat Layer (Subcutaneous Tissue): No Tendon: No Muscle: No Joint: No Bone:  No None N/Tina N/Tina Epithelialization: Excoriation: No N/Tina N/Tina Periwound Skin Texture: Induration: No Callus: No Crepitus: No Rash: No Scarring: No Maceration: No N/Tina N/Tina Periwound Skin Moisture: Dry/Scaly: No Atrophie Blanche: No N/Tina N/Tina Periwound Skin Color: Cyanosis: No Ecchymosis: No Erythema: No Hemosiderin Staining: No Mottled: No Pallor: No Rubor: No Treatment Notes Electronic Signature(s) Signed: 04/19/2023 2:47:41 PM By: Geralyn Corwin DO Entered By: Geralyn Corwin on 04/19/2023 14:23:28 Multi-Disciplinary Care Plan Details -------------------------------------------------------------------------------- Tina Hester ([id number]) [id number] Page 4 of 6 Patient Name: Date of Service: Tina Hester 04/19/2023 1:15 PM Medical Record Number: [id number] Patient Account Number: [id number] Date of Birth/Sex: Treating RN: [birth year] (82 y.o. [name] Primary Care Mikael Debell: [name] Other Clinician: Referring Sadik Piascik: Treating Shiree Altemus/Extender: Burman Riis in Treatment: 6 Active Inactive Electronic Signature(s) Signed: 05/10/2023 11:48:48 AM By: Fonnie Mu RN Signed: 05/13/2023 8:36:37 AM By: Shawn Stall RN, BSN Previous Signature: 04/19/2023 5:13:11 PM Version By: Shawn Stall RN, BSN Entered By: Fonnie Mu on 05/10/2023 11:48:48 -------------------------------------------------------------------------------- Pain Assessment Details Patient Name: Date of Service: Tina Hester, Tina BERTA N. 04/19/2023 1:15 PM Medical Record Number: [id number] Patient Account Number: [id number] Date of Birth/Sex: Treating RN: 01/05/1941 (82 y.o. Gevena Mart Primary Care Zaccheus Edmister: [name] Other Clinician: Referring Skanda Worlds: Treating Teah Votaw/Extender: Burman Riis in Treatment: 6 Active Problems Location of Pain Severity and Description of  Pain Patient Has Paino No Site Locations Pain Management and Medication Current Pain Management: Electronic Signature(s) Signed: 04/26/2023 2:03:55 PM By: Brenton Grills Entered By: Brenton Grills on 04/19/2023 13:43:43 Patient/Caregiver Education Details -------------------------------------------------------------------------------- Tina Hester ([id number]) [id number] Page 5 of 6 Patient Name: Date of Service: Tina Hester 8/20/2024andnbsp1:15 PM Medical Record Number: [id number] Patient Account Number: [id number] Date of Birth/Gender: Treating RN: August 21, 1941 (82 y.o. [name] Primary Care Physician: [name] Other Clinician: Referring Physician: Treating Physician/Extender: Burman Riis in Treatment: 6 Education Assessment Education Provided To: Patient Education Topics Provided Wound/Skin Impairment: Handouts: Caring for Your Ulcer Methods: Explain/Verbal Responses: Reinforcements needed Electronic Signature(s) Signed: 04/19/2023 5:13:11 PM By: Shawn Stall RN, BSN Entered By: Shawn Stall on 04/19/2023 14:28:18 -------------------------------------------------------------------------------- Wound Assessment Details Patient Name: Date of Service: Tina Cash N. 04/19/2023 1:15 PM Medical Record Number: [id number] Patient Account Number: [id number] Date of Birth/Sex: Treating RN: Feb 02, 1941 (82 y.o. Gevena Mart Primary Care Seve Monette: [name] Other Clinician: Referring Mcgregor Tinnon: Treating Millie Forde/Extender:

## 2023-05-05 ENCOUNTER — Encounter (HOSPITAL_BASED_OUTPATIENT_CLINIC_OR_DEPARTMENT_OTHER): Payer: PPO | Admitting: Internal Medicine

## 2023-05-06 ENCOUNTER — Emergency Department (HOSPITAL_COMMUNITY)
Admission: EM | Admit: 2023-05-06 | Discharge: 2023-05-09 | Disposition: A | Discharge status: Home / Self Care | Payer: PPO | Attending: Student | Admitting: Student

## 2023-05-06 ENCOUNTER — Emergency Department (HOSPITAL_COMMUNITY): Payer: PPO

## 2023-05-06 ENCOUNTER — Encounter (HOSPITAL_COMMUNITY): Payer: Self-pay

## 2023-05-06 ENCOUNTER — Other Ambulatory Visit: Payer: Self-pay

## 2023-05-06 DIAGNOSIS — R519 Headache, unspecified: Secondary | ICD-10-CM | POA: Insufficient documentation

## 2023-05-06 DIAGNOSIS — S299XXA Unspecified injury of thorax, initial encounter: Secondary | ICD-10-CM | POA: Diagnosis not present

## 2023-05-06 DIAGNOSIS — Z981 Arthrodesis status: Secondary | ICD-10-CM | POA: Diagnosis not present

## 2023-05-06 DIAGNOSIS — R296 Repeated falls: Secondary | ICD-10-CM | POA: Diagnosis not present

## 2023-05-06 DIAGNOSIS — E1165 Type 2 diabetes mellitus with hyperglycemia: Secondary | ICD-10-CM | POA: Insufficient documentation

## 2023-05-06 DIAGNOSIS — M549 Dorsalgia, unspecified: Secondary | ICD-10-CM | POA: Diagnosis not present

## 2023-05-06 DIAGNOSIS — S199XXA Unspecified injury of neck, initial encounter: Secondary | ICD-10-CM | POA: Diagnosis not present

## 2023-05-06 DIAGNOSIS — Z8673 Personal history of transient ischemic attack (TIA), and cerebral infarction without residual deficits: Secondary | ICD-10-CM | POA: Diagnosis not present

## 2023-05-06 DIAGNOSIS — S3991XA Unspecified injury of abdomen, initial encounter: Secondary | ICD-10-CM | POA: Diagnosis not present

## 2023-05-06 DIAGNOSIS — W19XXXA Unspecified fall, initial encounter: Secondary | ICD-10-CM

## 2023-05-06 DIAGNOSIS — Z794 Long term (current) use of insulin: Secondary | ICD-10-CM | POA: Diagnosis not present

## 2023-05-06 DIAGNOSIS — I2510 Atherosclerotic heart disease of native coronary artery without angina pectoris: Secondary | ICD-10-CM | POA: Diagnosis not present

## 2023-05-06 DIAGNOSIS — I130 Hypertensive heart and chronic kidney disease with heart failure and stage 1 through stage 4 chronic kidney disease, or unspecified chronic kidney disease: Secondary | ICD-10-CM | POA: Diagnosis not present

## 2023-05-06 DIAGNOSIS — I509 Heart failure, unspecified: Secondary | ICD-10-CM | POA: Diagnosis not present

## 2023-05-06 DIAGNOSIS — R739 Hyperglycemia, unspecified: Secondary | ICD-10-CM | POA: Diagnosis not present

## 2023-05-06 DIAGNOSIS — R0781 Pleurodynia: Secondary | ICD-10-CM | POA: Insufficient documentation

## 2023-05-06 DIAGNOSIS — E039 Hypothyroidism, unspecified: Secondary | ICD-10-CM | POA: Insufficient documentation

## 2023-05-06 DIAGNOSIS — Z79899 Other long term (current) drug therapy: Secondary | ICD-10-CM | POA: Insufficient documentation

## 2023-05-06 DIAGNOSIS — N183 Chronic kidney disease, stage 3 unspecified: Secondary | ICD-10-CM | POA: Diagnosis not present

## 2023-05-06 DIAGNOSIS — Z471 Aftercare following joint replacement surgery: Secondary | ICD-10-CM | POA: Diagnosis not present

## 2023-05-06 DIAGNOSIS — Z7982 Long term (current) use of aspirin: Secondary | ICD-10-CM | POA: Diagnosis not present

## 2023-05-06 DIAGNOSIS — M19071 Primary osteoarthritis, right ankle and foot: Secondary | ICD-10-CM | POA: Diagnosis not present

## 2023-05-06 DIAGNOSIS — S0990XA Unspecified injury of head, initial encounter: Secondary | ICD-10-CM | POA: Diagnosis not present

## 2023-05-06 DIAGNOSIS — Z7901 Long term (current) use of anticoagulants: Secondary | ICD-10-CM | POA: Insufficient documentation

## 2023-05-06 DIAGNOSIS — E1122 Type 2 diabetes mellitus with diabetic chronic kidney disease: Secondary | ICD-10-CM | POA: Diagnosis not present

## 2023-05-06 DIAGNOSIS — I10 Essential (primary) hypertension: Secondary | ICD-10-CM | POA: Diagnosis not present

## 2023-05-06 DIAGNOSIS — S3993XA Unspecified injury of pelvis, initial encounter: Secondary | ICD-10-CM | POA: Diagnosis not present

## 2023-05-06 DIAGNOSIS — M25571 Pain in right ankle and joints of right foot: Secondary | ICD-10-CM | POA: Insufficient documentation

## 2023-05-06 DIAGNOSIS — M7731 Calcaneal spur, right foot: Secondary | ICD-10-CM | POA: Diagnosis not present

## 2023-05-06 DIAGNOSIS — R531 Weakness: Secondary | ICD-10-CM | POA: Diagnosis not present

## 2023-05-06 DIAGNOSIS — N3289 Other specified disorders of bladder: Secondary | ICD-10-CM | POA: Diagnosis not present

## 2023-05-06 LAB — COMPREHENSIVE METABOLIC PANEL
ALT: 16 U/L (ref 0–44)
AST: 22 U/L (ref 15–41)
Albumin: 3.1 g/dL — ABNORMAL LOW (ref 3.5–5.0)
Alkaline Phosphatase: 101 U/L (ref 38–126)
Anion gap: 7 (ref 5–15)
BUN: 23 mg/dL (ref 8–23)
CO2: 26 mmol/L (ref 22–32)
Calcium: 8.7 mg/dL — ABNORMAL LOW (ref 8.9–10.3)
Chloride: 103 mmol/L (ref 98–111)
Creatinine, Ser: 1.33 mg/dL — ABNORMAL HIGH (ref 0.44–1.00)
GFR, Estimated: 40 mL/min — ABNORMAL LOW (ref >60)
Glucose, Bld: 297 mg/dL — ABNORMAL HIGH (ref 70–99)
Potassium: 4.6 mmol/L (ref 3.5–5.1)
Sodium: 136 mmol/L (ref 135–145)
Total Bilirubin: 0.5 mg/dL (ref 0.3–1.2)
Total Protein: 6.8 g/dL (ref 6.5–8.1)

## 2023-05-06 LAB — I-STAT CHEM 8, ED
BUN: 25 mg/dL — ABNORMAL HIGH (ref 8–23)
Calcium, Ion: 1.17 mmol/L (ref 1.15–1.40)
Chloride: 104 mmol/L (ref 98–111)
Creatinine, Ser: 1.3 mg/dL — ABNORMAL HIGH (ref 0.44–1.00)
Glucose, Bld: 293 mg/dL — ABNORMAL HIGH (ref 70–99)
HCT: 37 % (ref 36.0–46.0)
Hemoglobin: 12.6 g/dL (ref 12.0–15.0)
Potassium: 4.7 mmol/L (ref 3.5–5.1)
Sodium: 139 mmol/L (ref 135–145)
TCO2: 25 mmol/L (ref 22–32)

## 2023-05-06 LAB — CBC WITH DIFFERENTIAL/PLATELET
Abs Immature Granulocytes: 0.02 10*3/uL (ref 0.00–0.07)
Basophils Absolute: 0.1 10*3/uL (ref 0.0–0.1)
Basophils Relative: 1 %
Eosinophils Absolute: 0.1 10*3/uL (ref 0.0–0.5)
Eosinophils Relative: 1 %
HCT: 38.3 % (ref 36.0–46.0)
Hemoglobin: 11.7 g/dL — ABNORMAL LOW (ref 12.0–15.0)
Immature Granulocytes: 0 %
Lymphocytes Relative: 20 %
Lymphs Abs: 1.8 10*3/uL (ref 0.7–4.0)
MCH: 27.1 pg (ref 26.0–34.0)
MCHC: 30.5 g/dL (ref 30.0–36.0)
MCV: 88.7 fL (ref 80.0–100.0)
Monocytes Absolute: 1.4 10*3/uL — ABNORMAL HIGH (ref 0.1–1.0)
Monocytes Relative: 16 %
Neutro Abs: 5.7 10*3/uL (ref 1.7–7.7)
Neutrophils Relative %: 62 %
Platelets: 169 10*3/uL (ref 150–400)
RBC: 4.32 MIL/uL (ref 3.87–5.11)
RDW: 15.3 % (ref 11.5–15.5)
WBC: 9.1 10*3/uL (ref 4.0–10.5)
nRBC: 0 % (ref 0.0–0.2)

## 2023-05-06 LAB — CBG MONITORING, ED
Glucose-Capillary: 227 mg/dL — ABNORMAL HIGH (ref 70–99)
Glucose-Capillary: 160 mg/dL — ABNORMAL HIGH (ref 70–99)

## 2023-05-06 LAB — HEMOGLOBIN A1C
Hgb A1c MFr Bld: 8 % — ABNORMAL HIGH (ref 4.8–5.6)
Mean Plasma Glucose: 182.9 mg/dL

## 2023-05-06 MED ORDER — ACETAMINOPHEN 500 MG PO TABS
500.0000 mg | ORAL_TABLET | ORAL | Status: DC | PRN
  Administered 2023-05-06 – 2023-05-09 (×3): 500 mg via ORAL
  Filled 2023-05-06 (×3): qty 1

## 2023-05-06 MED ORDER — HYDROCODONE-ACETAMINOPHEN 5-325 MG PO TABS
1.0000 | ORAL_TABLET | Freq: Once | ORAL | Status: AC
  Administered 2023-05-06: 1 via ORAL
  Filled 2023-05-06: qty 1

## 2023-05-06 MED ORDER — IOHEXOL 350 MG/ML SOLN
75.0000 mL | Freq: Once | INTRAVENOUS | Status: AC | PRN
  Administered 2023-05-06: 75 mL via INTRAVENOUS

## 2023-05-06 MED ORDER — APIXABAN 5 MG PO TABS
5.0000 mg | ORAL_TABLET | Freq: Two times a day (BID) | ORAL | Status: DC
  Administered 2023-05-06 – 2023-05-09 (×6): 5 mg via ORAL
  Filled 2023-05-06 (×6): qty 1

## 2023-05-06 MED ORDER — INSULIN ASPART 100 UNIT/ML IJ SOLN
0.0000 [IU] | Freq: Three times a day (TID) | INTRAMUSCULAR | Status: DC
  Administered 2023-05-06: 5 [IU] via SUBCUTANEOUS
  Administered 2023-05-07 (×2): 2 [IU] via SUBCUTANEOUS
  Administered 2023-05-08 (×2): 3 [IU] via SUBCUTANEOUS

## 2023-05-06 MED ORDER — METOPROLOL TARTRATE 25 MG PO TABS
50.0000 mg | ORAL_TABLET | Freq: Two times a day (BID) | ORAL | Status: DC
  Administered 2023-05-06 – 2023-05-09 (×7): 50 mg via ORAL
  Filled 2023-05-06 (×7): qty 2

## 2023-05-06 MED ORDER — IRBESARTAN 300 MG PO TABS
150.0000 mg | ORAL_TABLET | Freq: Every day | ORAL | Status: DC
  Administered 2023-05-06 – 2023-05-09 (×4): 150 mg via ORAL
  Filled 2023-05-06 (×4): qty 1

## 2023-05-06 MED ORDER — DONEPEZIL HCL 10 MG PO TABS
5.0000 mg | ORAL_TABLET | Freq: Every day | ORAL | Status: DC
  Administered 2023-05-06 – 2023-05-09 (×4): 5 mg via ORAL
  Filled 2023-05-06 (×4): qty 1

## 2023-05-06 MED ORDER — INSULIN ASPART 100 UNIT/ML IJ SOLN: 0.0000 [IU] | Freq: Every day | INTRAMUSCULAR | Status: DC

## 2023-05-06 MED ORDER — ALPRAZOLAM 0.25 MG PO TABS: 0.2500 mg | ORAL_TABLET | Freq: Every evening | ORAL | Status: DC | PRN

## 2023-05-06 MED ORDER — ASPIRIN 81 MG PO TBEC
81.0000 mg | DELAYED_RELEASE_TABLET | Freq: Every day | ORAL | Status: DC
  Administered 2023-05-06 – 2023-05-09 (×4): 81 mg via ORAL
  Filled 2023-05-06 (×4): qty 1

## 2023-05-06 MED ORDER — EZETIMIBE 10 MG PO TABS
10.0000 mg | ORAL_TABLET | Freq: Every day | ORAL | Status: DC
  Administered 2023-05-06 – 2023-05-09 (×4): 10 mg via ORAL
  Filled 2023-05-06 (×4): qty 1

## 2023-05-06 MED ORDER — EMPAGLIFLOZIN 10 MG PO TABS
10.0000 mg | ORAL_TABLET | Freq: Every day | ORAL | Status: DC
  Administered 2023-05-06 – 2023-05-09 (×4): 10 mg via ORAL
  Filled 2023-05-06 (×4): qty 1

## 2023-05-06 MED ORDER — ATORVASTATIN CALCIUM 40 MG PO TABS
40.0000 mg | ORAL_TABLET | Freq: Every day | ORAL | Status: DC
  Administered 2023-05-06 – 2023-05-09 (×4): 40 mg via ORAL
  Filled 2023-05-06 (×4): qty 1

## 2023-05-06 NOTE — [place] FL2 (Signed)
[place] MEDICAID FL2 LEVEL OF CARE FORM     IDENTIFICATION  Patient Name: Tina Hester Birthdate: April 21, 1941 Sex: female Admission Date (Current Location): 05/06/2023  [facility] and [place] Number:  Producer, television/film/video and Address:  [facility], [address] [place], [place] [address]      Provider Number: [id number]  Attending Physician Name and Address:  Glendora Score, MD  Relative Name and Phone Number:  Vella Kohler (Daughter)  [phone]    Current Level of Care: Hospital Recommended Level of Care: Skilled Nursing Facility Prior Approval Number:    Date Approved/Denied:   PASRR Number: [id number]  Discharge Plan: SNF    Current Diagnoses: Patient Active Problem List   Diagnosis Date Noted   Anemia, unspecified 01/18/2023   Left leg swelling 12/31/2022   Leukocytosis 12/31/2022   Hyponatremia 12/31/2022   Type 2 diabetes mellitus with hyperglycemia (HCC) 12/31/2022   GERD (gastroesophageal reflux disease) 12/31/2022   Dementia without behavioral disturbance (HCC) 12/31/2022   Open wound of right heel 12/30/2022   Abdominal aortic atherosclerosis (HCC) 11/05/2020   Acquired thrombophilia (HCC) 11/05/2020   Lumbar radiculopathy 11/05/2020   Hematoma of lower leg 12/18/2018   Low back pain 12/18/2018   Contusion of left knee 06/16/2018   Pain in left knee 06/16/2018   Rib pain 06/16/2018   Glaucoma suspect of left eye 12/22/2017   Secondary open-angle glaucoma of right eye, indeterminate stage 12/22/2017   Postural dizziness with presyncope 12/13/2017   Chest pain 12/13/2017   Hyperkalemia 12/13/2017   Acute kidney injury (HCC) 12/13/2017   Fluid overload 12/13/2017   Bradycardia 12/13/2017   CHF (congestive heart failure) (HCC) 12/13/2017   Aortic stenosis 11/02/2016   Aortic valve disorder 11/02/2016   Abrasion of right cornea 04/12/2016   Status post corneal transplant 04/12/2016   Combined forms of age-related  cataract of left eye 04/12/2016   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   Pulmonary HTN (HCC) 12/15/2015   Bilateral carotid artery stenosis 06/08/2015   Essential hypertension    OSA on CPAP    Persistent atrial fibrillation (HCC)    Morbid obesity (HCC) 09/04/2013   Gout 09/04/2013   DM2 (diabetes mellitus, type 2) (HCC) 05/29/2013   Mixed hyperlipidemia 05/29/2013   Anxiety 01/31/2012   Chronic kidney disease, stage 3b (HCC) 01/31/2012   Diabetes mellitus type 2 with complications, uncontrolled 01/31/2012   Acquired hypothyroidism 01/31/2012   TIA (transient ischemic attack) 01/31/2012   H/O difficult intubation 01/31/2012   Lupus (HCC) 01/31/2012   CAD (coronary artery disease), native coronary artery 01/26/2012    Orientation RESPIRATION BLADDER Height & Weight     Self, Time, Situation, Place  Normal Continent Weight:  200 lbs Height:   5'1  BEHAVIORAL SYMPTOMS/MOOD NEUROLOGICAL BOWEL NUTRITION STATUS      Continent Diet (Regular)  AMBULATORY STATUS COMMUNICATION OF NEEDS Skin   Extensive Assist Verbally Normal                       Personal Care Assistance Level of Assistance  Bathing, Feeding, Dressing Bathing Assistance: Limited assistance Feeding assistance: Independent Dressing Assistance: Limited assistance     Functional Limitations Info  Sight, Hearing, Speech Sight Info: Adequate Hearing Info: Adequate Speech Info: Adequate    SPECIAL CARE FACTORS FREQUENCY                       Contractures Contractures Info: Not present  Additional Factors Info  Allergies, Code Status Code Status Info: Full Allergies Info: Pneumococcal Vaccine, Pneumovax 23 (pneumococcal Vac Polyvalent), Sulfa Antibiotics, Pregabalin           Current Medications (05/06/2023):  This is the current hospital active medication list Current Facility-Administered Medications  Medication Dose Route Frequency Provider Last Rate Last Admin   acetaminophen (TYLENOL)  tablet 500 mg  500 mg Oral Q4H PRN Kommor, Madison, MD       ALPRAZolam Prudy Feeler) tablet 0.25 mg  0.25 mg Oral QHS PRN Kommor, Madison, MD       apixaban (ELIQUIS) tablet 5 mg  5 mg Oral BID Kommor, Madison, MD       aspirin EC tablet 81 mg  81 mg Oral Daily Kommor, Madison, MD       atorvastatin (LIPITOR) tablet 40 mg  40 mg Oral Daily Kommor, Madison, MD       donepezil (ARICEPT) tablet 5 mg  5 mg Oral Daily Kommor, Madison, MD       empagliflozin (JARDIANCE) tablet 10 mg  10 mg Oral Daily Kommor, Madison, MD       ezetimibe (ZETIA) tablet 10 mg  10 mg Oral Daily Kommor, Madison, MD       HYDROcodone-acetaminophen (NORCO/VICODIN) 5-325 MG per tablet 1 tablet  1 tablet Oral Once Kommor, Madison, MD       insulin aspart (novoLOG) injection 0-15 Units  0-15 Units Subcutaneous TID WC Kommor, Madison, MD       insulin aspart (novoLOG) injection 0-5 Units  0-5 Units Subcutaneous QHS Kommor, Madison, MD       irbesartan (AVAPRO) tablet 150 mg  150 mg Oral Daily Kommor, Madison, MD       metoprolol tartrate (LOPRESSOR) tablet 50 mg  50 mg Oral BID Kommor, Madison, MD       Current Outpatient Medications  Medication Sig Dispense Refill   acetaminophen (TYLENOL) 325 MG tablet Take 500 mg by mouth every 4 (four) hours as needed for mild pain or moderate pain.     ALPRAZolam (XANAX) 0.25 MG tablet Take 1 tablet (0.25 mg total) by mouth at bedtime as needed for anxiety. 10 tablet 0   aspirin EC 81 MG tablet Take 1 tablet (81 mg total) by mouth daily. Swallow whole. 150 tablet 2   atorvastatin (LIPITOR) 40 MG tablet TAKE ONE TABLET BY MOUTH EVERY EVENING 90 tablet 3   donepezil (ARICEPT) 5 MG tablet Take 5 mg by mouth daily.     dronedarone (MULTAQ) 400 MG tablet      ELIQUIS 5 MG TABS tablet TAKE ONE TABLET BY MOUTH TWICE DAILY 180 tablet 1   empagliflozin (JARDIANCE) 10 MG TABS tablet Take 10 mg by mouth daily.     ezetimibe (ZETIA) 10 MG tablet Take 10 mg by mouth daily.     insulin lispro (HUMALOG)  100 UNIT/ML injection Inject 1-7 Units into the skin 3 (three) times daily before meals. Sliding scale 141-180=1 unit,181-220= 2 units,221-260= 3 units,261-300=4 units, 301-340=5 units, 341-380=6 units, 381-400 = 7 units,> 400 call MD     Insulin NPH, Human,, Isophane, (NOVOLIN N FLEXPEN) 100 UNIT/ML Kiwkpen Inject 15 units at morning and 18 units at night 15 mL 2   metoprolol tartrate (LOPRESSOR) 50 MG tablet Take 1 tablet (50 mg total) by mouth 2 (two) times daily. 180 tablet 3   olmesartan (BENICAR) 20 MG tablet Take 20 mg by mouth daily.     pioglitazone (ACTOS) 15 MG tablet Take  1 tablet (15 mg total) by mouth every morning. 90 tablet 0   sertraline (ZOLOFT) 50 MG tablet Take 50 mg by mouth daily.       Discharge Medications: Please see discharge summary for a list of discharge medications.  Relevant Imaging Results:  Relevant Lab Results:   Additional Information SSN [id number]. Per daughter patient has been vaccinated for covid.  Susa Simmonds, LCSWA

## 2023-05-06 NOTE — ED Notes (Signed)
Help get patient on the monitor patient is resting with call bell in reach 

## 2023-05-06 NOTE — Progress Notes (Signed)
CSW received a call from patients daughter who would like to accept the bed offer for [facility]. TOC will follow-up with the facility tomorrow morning. CSW contacted [facility] and left a message requesting a call back.

## 2023-05-06 NOTE — Discharge Planning (Signed)
RNCM consulted regarding safe discharge planning (Home with Home Health vs Skilled Nursing Facility Placement).  Physical Therapy evaluation placed; will follow up after recommendations from PT.     

## 2023-05-06 NOTE — Evaluation (Signed)
Physical Therapy Evaluation Patient Details Name: Tina Hester MRN: [id number] DOB: 1940-09-30 Today's Date: 05/06/2023  History of Present Illness  Pt is 82 yo presenting to [facility] ED via EMS due to multiple falls in the past week. Pt has rib pain from previous fall and now leg pain in the RLE from fall yesterday. Pt was found to have no rib fractures from previous fall and currently x-ray was negative for the R ankle. Pt lives at home with daughter and uses rollator at baseline. 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  Clinical Impression  Pt is presenting below baseline level of functioning. About 1-2 weeks ago pt was able to perform bed mobility, sit to stand and gait without physical assist using a rollator per daughter. Currently pt is Max A to Total assist for bed mobility and Max A for sit to stand at stretcher with heavy posterior lean and unable/unwilling to put wgt through the R LE. Pt was unable to progress gait or tranfers at this time. Due to current level of function, home set up and available assistance at home recommending skilled physical therapy services < 3 hours/day on discharge from acute care hospital setting in order to decrease risk for falls, immobility, injury, decrease burden of care and decrease risk for re-hospitalization. Pt HR up to 148 bpm during session intermittently then down to 95-100 bpm.          If plan is discharge home, recommend the following: Two people to help with walking and/or transfers;Help with stairs or ramp for entrance;Assist for transportation;Assistance with cooking/housework   Can travel by private  vehicle   No    Equipment Recommendations Wheelchair (measurements PT);Wheelchair cushion (measurements PT);Hoyer lift;Hospital bed;Other (comment) (defer to post acute)  Recommendations for Other Services       Functional Status Assessment Patient has had a recent decline in their functional status and demonstrates the ability to make significant improvements in function in a reasonable and predictable amount of time.     Precautions / Restrictions Precautions Precautions: Fall Precaution Comments: watch HR Restrictions Weight Bearing Restrictions: No      Mobility  Bed Mobility Overal bed mobility: Needs Assistance Bed Mobility: Supine to Sit, Sit to Supine     Supine to sit: Max assist Sit to supine: +2 for physical assistance, Total assist   General bed mobility comments: Pt is max A with significant increase in time to get to sitting EOB with continuous cueing for sequencing and re-direction to task as well as Max A for trunk and bil LE to get to sitting stretcher. Pt was total assist for getting back to supine from sitting with daughter assisting at trunk and this PT performing total assist at bil LE to get to supine due to pt was unable and unwilling to follow directions for movement due to fear of pain and falling.    Transfers Overall transfer level: Needs assistance Equipment used: Rolling walker (2 wheels) Transfers: Sit to/from Stand Sit to Stand: Max assist           General transfer comment: Pt stood without any weight on the RLE and would place on the floor then pick up stating she was unable to put wgt through her foot  without attempting wgt through the foot. Pt was leaning heavily posterior.    Ambulation/Gait               General Gait Details: unable to attempt due to current functional status.      Balance Overall balance assessment: Needs assistance Sitting-balance support: Bilateral upper extremity supported, Feet unsupported, Feet  supported Sitting balance-Leahy Scale: Fair Sitting balance - Comments: no overt LOB sitting EOB Postural control: Posterior lean Standing balance support: Bilateral upper extremity supported, Reliant on assistive device for balance Standing balance-Leahy Scale: Zero Standing balance comment: Heavy assist to remain standing         Pertinent Vitals/Pain Pain Assessment Pain Assessment: Faces Faces Pain Scale: Hurts whole lot Pain Location: R ribs and R LE Pain Descriptors / Indicators: Guarding, Grimacing Pain Intervention(s): Monitored during session, Limited activity within patient's tolerance, RN gave pain meds during session    Home Living Family/patient expects to be discharged to:: Private residence Living Arrangements: Children (lives with daughter) Available Help at Discharge: Available 24 hours/day Type of Home: House Home Access: Stairs to enter Entrance Stairs-Rails: Can reach both (3 steps in the front with one rail) Entrance Stairs-Number of Steps: 4   Home Layout: Two level;Able to live on main level with bedroom/bathroom Home Equipment: Rolling Walker (2 wheels);Shower seat      Prior Function Prior Level of Function : Needs assist             Mobility Comments: Daughter states pt uses rollator usually for mobility. States in the past week she has had to physically with another person help pt out of bed and lift her to stand. ADLs Comments: assisted     Extremity/Trunk Assessment   Upper Extremity Assessment Upper Extremity Assessment: Generalized weakness    Lower Extremity Assessment Lower Extremity Assessment: Generalized weakness;RLE deficits/detail RLE Deficits / Details: Pain in the RLE    Cervical / Trunk Assessment Cervical / Trunk Assessment: Kyphotic  Communication   Communication Communication: Difficulty following commands/understanding;Difficulty communicating thoughts/reduced clarity of speech Following commands: Follows one step  commands inconsistently Cueing Techniques: Verbal cues;Gestural cues;Tactile cues;Visual cues  Cognition Arousal: Alert Behavior During Therapy: Anxious Overall Cognitive Status: History of cognitive impairments - at baseline     General Comments: Pt is very fearful of pain and falling which makes her resistant to mobility intermittently        General Comments General comments (skin integrity, edema, etc.): rubor at the R lower leg, Pt daughter present throughout session. Pt cognition was in and out and she needed frequent re-direction to tasks due to stalling.        Assessment/Plan    PT Assessment Patient needs continued PT services  PT Problem List Decreased strength;Decreased balance;Pain;Decreased mobility;Decreased activity tolerance;Decreased safety awareness       PT Treatment Interventions Balance training;DME instruction;Functional mobility training;Patient/family education;Gait training;Therapeutic activities;Neuromuscular re-education;Stair training;Therapeutic exercise    PT Goals (Current goals can be found in the Care Plan section)  Acute Rehab PT Goals Patient Stated Goal: to go to rehab to get stronger PT Goal Formulation: With patient/family Time For Goal Achievement: 05/20/23 Potential to Achieve Goals: Fair    Frequency Min 1X/week        AM-PAC PT "6 Clicks" Mobility  Outcome Measure Help needed turning from your back to your side while in a flat bed without using bedrails?: Total Help needed moving from lying on your back to sitting on the side of a flat bed without using  bedrails?: A Lot Help needed moving to and from a bed to a chair (including a wheelchair)?: Total Help needed standing up from a chair using your arms (e.g., wheelchair or bedside chair)?: Total Help needed to walk in hospital room?: Total Help needed climbing 3-5 steps with a railing? : Total 6 Click Score: 7    End of Session Equipment Utilized During Treatment: Gait  belt Activity Tolerance: Patient limited by pain Patient left: in bed;with call bell/phone within reach;with family/visitor present Nurse Communication: Mobility status PT Visit Diagnosis: Other abnormalities of gait and mobility (R26.89);Muscle weakness (generalized) (M62.81);Repeated falls (R29.6)    Time: 2956-2130 PT Time Calculation (min) (ACUTE ONLY): 41 min   Charges:   PT Evaluation $PT Eval Low Complexity: 1 Low PT Treatments $Therapeutic Activity: 23-37 mins PT General Charges $$ ACUTE PT VISIT: 1 Visit        Harrel Carina, DPT, CLT  Acute Rehabilitation Services Office: [phone] (Secure chat preferred)   Claudia Desanctis 05/06/2023, 4:39 PM

## 2023-05-06 NOTE — TOC Initial Note (Signed)
Transition of Care [facility]) - Initial/Assessment Note    Patient Details  Name: Tina Hester MRN: [id number] Date of Birth: 1941-03-31  [facility] CM/SW Contact:    Susa Simmonds, LCSWA Phone Number: 05/06/2023, 2:33 PM  Clinical Narrative: CSW spoke with patient and daughter [name] at bedside. Patients daughter reports patient having recent falls and weakness. Patients daughter stated patient previously had [facility] for home health but was released from OT. Patients daughter stated PT never provided services to due patients blood pressure being too low and they never returned to do her mothers assessment. Patients daughter stated that her mother was at [facility] a few months ago and prefers not to return to that facility. CSW asked if patient had any medicaid for secondary insurance. Patient stated no and that she makes over the income limit. CSW advised patient and daughter that physical therapy will complete their assessment to determine the level of care needed. Patients daughter asked to be updated and prefers her mother to not sit in the hospital all weekend.                    Expected Discharge Plan: Skilled Nursing Facility Barriers to Discharge: Continued Medical Work up   Patient Goals and CMS Choice Patient states their goals for this hospitalization and ongoing recovery are:: Return home          Expected Discharge Plan and Services       Living arrangements for the past 2 months: Single Family Home                                      Prior Living Arrangements/Services Living arrangements for the past 2 months: Single Family Home Lives with:: Adult Children Patient language and need for interpreter reviewed:: Yes Do you feel safe going back to the place where you live?: Yes      Need for Family Participation in Patient Care: Yes (Comment) Care giver support system in place?: Yes (comment)   Criminal Activity/Legal Involvement  Pertinent to Current Situation/Hospitalization: No - Comment as needed  Activities of Daily Living      Permission Sought/Granted   Permission granted to share information with : Yes, Verbal Permission Granted  Share Information with NAME: Vella Kohler     Permission granted to share info w Relationship: Daughter  Permission granted to share info w Contact Information: [phone]  Emotional Assessment Appearance:: Appears stated age Attitude/Demeanor/Rapport: Engaged Affect (typically observed): Accepting Orientation: : Oriented to Self, Oriented to Place, Oriented to  Time, Oriented to Situation Alcohol / Substance Use: Not Applicable Psych Involvement: No (comment)  Admission diagnosis:  fall Patient Active Problem List   Diagnosis Date Noted   Anemia, unspecified 01/18/2023   Left leg swelling 12/31/2022   Leukocytosis 12/31/2022   Hyponatremia 12/31/2022   Type 2 diabetes mellitus with hyperglycemia (HCC) 12/31/2022   GERD (gastroesophageal reflux disease) 12/31/2022   Dementia without behavioral disturbance (HCC) 12/31/2022   Open wound of right heel 12/30/2022   Abdominal aortic atherosclerosis (HCC) 11/05/2020   Acquired thrombophilia (HCC) 11/05/2020   Lumbar radiculopathy 11/05/2020   Hematoma of lower leg 12/18/2018   Low back pain 12/18/2018   Contusion of left knee 06/16/2018   Pain in left knee 06/16/2018   Rib pain 06/16/2018   Glaucoma suspect of left eye 12/22/2017   Secondary open-angle glaucoma of right eye,  indeterminate stage 12/22/2017   Postural dizziness with presyncope 12/13/2017   Chest pain 12/13/2017   Hyperkalemia 12/13/2017   Acute kidney injury (HCC) 12/13/2017   Fluid overload 12/13/2017   Bradycardia 12/13/2017   CHF (congestive heart failure) (HCC) 12/13/2017   Aortic stenosis 11/02/2016   Aortic valve disorder 11/02/2016   Abrasion of right cornea 04/12/2016   Status post corneal transplant 04/12/2016   Combined forms of  age-related cataract of left eye 04/12/2016   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   Pulmonary HTN (HCC) 12/15/2015   Bilateral carotid artery stenosis 06/08/2015   Essential hypertension    OSA on CPAP    Persistent atrial fibrillation (HCC)    Morbid obesity (HCC) 09/04/2013   Gout 09/04/2013   DM2 (diabetes mellitus, type 2) (HCC) 05/29/2013   Mixed hyperlipidemia 05/29/2013   Anxiety 01/31/2012   Chronic kidney disease, stage 3b (HCC) 01/31/2012   Diabetes mellitus type 2 with complications, uncontrolled 01/31/2012   Acquired hypothyroidism 01/31/2012   TIA (transient ischemic attack) 01/31/2012   H/O difficult intubation 01/31/2012   Lupus (HCC) 01/31/2012   CAD (coronary artery disease), native coronary artery 01/26/2012   PCP:  [name], MD Pharmacy:   [facility] [phone] - [place], [place] - [address] AT [facility] [address] [place] [address] Phone: [phone] Fax: [phone]  [facility] - [place], [place] - [address] [place] [address] Phone: [phone] Fax: [phone]  [facility] - [place], [place] - [address] [place] [address] Phone: [phone] Fax: [phone]  [facility] - [facility] [place], [place] - [address] [place] [address] Phone: [phone] Fax: [phone]     Social Determinants of Health (SDOH) Social History: SDOH Screenings   Food Insecurity: No Food Insecurity (12/30/2022)  Housing: Low Risk  (12/30/2022)  Transportation Needs: No Transportation Needs (12/30/2022)  Utilities: Not At Risk (12/30/2022)  Depression (PHQ2-9): Low Risk  (06/23/2020)  Social Connections: Unknown (01/11/2022)   Received from [facility]  Tobacco Use: Low Risk  (05/06/2023)   SDOH Interventions:      Readmission Risk Interventions     No data to display

## 2023-05-06 NOTE — ED Triage Notes (Signed)
PT BIB EMS for multiple falls over the last few days, on blood thinners, but has not hit her head, and no LOC.  Increased right ankle, side, and back pain.  Decreased mobility due to the falls, and fell on concrete steps to her right side. Patients family request social work for possible inpatient rehab.    124/60 HR 90 99%RA CBG 316

## 2023-05-06 NOTE — Progress Notes (Signed)
PT consult pending 

## 2023-05-06 NOTE — ED Notes (Signed)
PT at bedside evaluating pt.

## 2023-05-06 NOTE — ED Notes (Signed)
Pt family member took of dressing on pt foot. She stated she wanted to see it and get that hard pice off of her foot. While she was looking she stated that she is going to let it air dry some then put the new dressing I gave her back on.

## 2023-05-06 NOTE — ED Provider Notes (Signed)
[place] EMERGENCY DEPARTMENT AT [facility] Provider Note  CSN: [id number] Arrival date & time: 05/06/23 0848  Chief Complaint(s) Fall  HPI Tina Hester is a 82 y.o. female with PMH HTN, HLD, A-fib, GERD, T2DM, hypothyroidism, osteomy who presents emergency department for evaluation of frequent falls.  Patient has reportedly had 3 falls over the last 3 days and family is concerned about patient's ability to take care of herself at home.  Worsening right ankle pain and right-sided rib pain after the fall.  No head strike or loss of consciousness.  Denies numbness, tingling, weakness or other neurologic complaints.  Denies shortness of breath, abdominal pain, nausea, vomiting or other systemic symptoms.   Past Medical History Past Medical History:  Diagnosis Date   Anemia    s/p Heme work -up normal EGD and colonscopy in 2012 per pt,neg SPEP   Anxiety    Aortic stenosis    Mild by echo 12/2021 with mean aortic valve gradient 17 mmHg   Bilateral carotid artery stenosis 06/08/2015   1-39% bilateral   Bradycardia    CKD (chronic kidney disease), stage III (HCC)    Diabetes mellitus without complication (HCC)    type 2   GERD (gastroesophageal reflux disease)    Gout    HSV-1 (herpes simplex virus 1) infection    Acyclovir prn   Hyperkalemia    Hyperlipidemia    Hypertension    Joint pain    osteoarthritis by Xray- possible erosion of R 4th MCP,elevated uric  acid ,ANA +    MCI (mild cognitive impairment)    Morbid obesity (HCC)    OSA on CPAP    Osteopenia    PAF (paroxysmal atrial fibrillation) (HCC)    Pain management    Neurosurg: Dr Odette Fraction   Pulmonary HTN (HCC)    Moderate by echo  2016. PASP   TIA (transient ischemic attack)    remote history of TIA's early 2000   Patient Active Problem List   Diagnosis Date Noted   Anemia, unspecified 01/18/2023   Left leg swelling 12/31/2022   Leukocytosis 12/31/2022   Hyponatremia 12/31/2022   Type  2 diabetes mellitus with hyperglycemia (HCC) 12/31/2022   GERD (gastroesophageal reflux disease) 12/31/2022   Dementia without behavioral disturbance (HCC) 12/31/2022   Open wound of right heel 12/30/2022   Abdominal aortic atherosclerosis (HCC) 11/05/2020   Acquired thrombophilia (HCC) 11/05/2020   Lumbar radiculopathy 11/05/2020   Hematoma of lower leg 12/18/2018   Low back pain 12/18/2018   Contusion of left knee 06/16/2018   Pain in left knee 06/16/2018   Rib pain 06/16/2018   Glaucoma suspect of left eye 12/22/2017   Secondary open-angle glaucoma of right eye, indeterminate stage 12/22/2017   Postural dizziness with presyncope 12/13/2017   Chest pain 12/13/2017   Hyperkalemia 12/13/2017   Acute kidney injury (HCC) 12/13/2017   Fluid overload 12/13/2017   Bradycardia 12/13/2017   CHF (congestive heart failure) (HCC) 12/13/2017   Aortic stenosis 11/02/2016   Aortic valve disorder 11/02/2016   Abrasion of right cornea 04/12/2016   Status post corneal transplant 04/12/2016   Combined forms of age-related cataract of left eye 04/12/2016   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   Pulmonary HTN (HCC) 12/15/2015   Bilateral carotid artery stenosis 06/08/2015   Essential hypertension    OSA on CPAP    Persistent atrial fibrillation (HCC)    Morbid obesity (HCC) 09/04/2013   Gout 09/04/2013   DM2 (diabetes mellitus, type  2) (HCC) 05/29/2013   Mixed hyperlipidemia 05/29/2013   Anxiety 01/31/2012   Chronic kidney disease, stage 3b (HCC) 01/31/2012   Diabetes mellitus type 2 with complications, uncontrolled 01/31/2012   Acquired hypothyroidism 01/31/2012   TIA (transient ischemic attack) 01/31/2012   H/O difficult intubation 01/31/2012   Lupus (HCC) 01/31/2012   CAD (coronary artery disease), native coronary artery 01/26/2012   Home Medication(s) Prior to Admission medications   Medication Sig Start Date End Date Taking? Authorizing Provider  acetaminophen (TYLENOL) 325 MG tablet  Take 500 mg by mouth every 4 (four) hours as needed for mild pain or moderate pain.    [provider]  ALPRAZolam Prudy Feeler) 0.25 MG tablet Take 1 tablet (0.25 mg total) by mouth at bedtime as needed for anxiety. 01/03/23   Sherryll Burger, Pratik D, DO  aspirin EC 81 MG tablet Take 1 tablet (81 mg total) by mouth daily. Swallow whole. 02/25/23 02/25/24  Leonie Douglas, MD  atorvastatin (LIPITOR) 40 MG tablet TAKE ONE TABLET BY MOUTH EVERY EVENING 01/29/22   Quintella Reichert, MD  donepezil (ARICEPT) 5 MG tablet Take 5 mg by mouth daily. 12/17/22   [provider]  dronedarone (MULTAQ) 400 MG tablet     [provider]  ELIQUIS 5 MG TABS tablet TAKE ONE TABLET BY MOUTH TWICE DAILY 07/15/22   Quintella Reichert, MD  empagliflozin (JARDIANCE) 10 MG TABS tablet Take 10 mg by mouth daily.    [provider]  ezetimibe (ZETIA) 10 MG tablet Take 10 mg by mouth daily. 12/17/22   [provider]  insulin lispro (HUMALOG) 100 UNIT/ML injection Inject 1-7 Units into the skin 3 (three) times daily before meals. Sliding scale 141-180=1 unit,181-220= 2 units,221-260= 3 units,261-300=4 units, 301-340=5 units, 341-380=6 units, 381-400 = 7 units,> 400 call MD    [provider]  Insulin NPH, Human,, Isophane, (NOVOLIN N FLEXPEN) 100 UNIT/ML Kiwkpen Inject 15 units at morning and 18 units at night 09/24/21   Reather Littler, MD  metoprolol tartrate (LOPRESSOR) 50 MG tablet Take 1 tablet (50 mg total) by mouth 2 (two) times daily. 05/07/22   Quintella Reichert, MD  olmesartan (BENICAR) 20 MG tablet Take 20 mg by mouth daily. 12/17/22   [provider]  pioglitazone (ACTOS) 15 MG tablet Take 1 tablet (15 mg total) by mouth every morning. 07/28/22   Reather Littler, MD  sertraline (ZOLOFT) 50 MG tablet Take 50 mg by mouth daily. 12/17/22   [provider]                                                                                                                                    Past  Surgical History Past Surgical History:  Procedure Laterality Date   ABDOMINAL HYSTERECTOMY     APPENDECTOMY     BACK SURGERY     cervical fusion   BLADDER SURGERY     CESAREAN  SECTION     CHOLECYSTECTOMY     LOWER EXTREMITY ANGIOGRAPHY N/A 02/25/2023   Procedure: Lower Extremity Angiography;  Surgeon: Leonie Douglas, MD;  Location: [facility] INVASIVE CV LAB;  Service: Cardiovascular;  Laterality: N/A;   PERIPHERAL VASCULAR INTERVENTION  02/25/2023   Procedure: PERIPHERAL VASCULAR INTERVENTION;  Surgeon: Leonie Douglas, MD;  Location: [facility] INVASIVE CV LAB;  Service: Cardiovascular;;   Family History Family History  Problem Relation Age of Onset   Arrhythmia Brother     Social History Social History   Tobacco Use   Smoking status: Never   Smokeless tobacco: Never  Vaping Use   Vaping status: Never Used  Substance Use Topics   Alcohol use: No   Drug use: No   Allergies Pneumococcal vaccine, Pneumovax 23 [pneumococcal vac polyvalent], Other, Sulfa antibiotics, and Pregabalin  Review of Systems Review of Systems  Cardiovascular:  Positive for chest pain.  Musculoskeletal:  Positive for arthralgias, joint swelling and myalgias.    Physical Exam Vital Signs  I have reviewed the triage vital signs BP (!) 130/50 (BP Location: Right Arm)   Pulse 89   Temp 98 F (36.7 C) (Oral)   Resp 20   SpO2 99%   Physical Exam Vitals and nursing note reviewed.  Constitutional:      General: She is not in acute distress.    Appearance: She is well-developed.  HENT:     Head: Normocephalic and atraumatic.  Eyes:     Conjunctiva/sclera: Conjunctivae normal.  Cardiovascular:     Rate and Rhythm: Normal rate and regular rhythm.     Heart sounds: No murmur heard. Pulmonary:     Effort: Pulmonary effort is normal. No respiratory distress.     Breath sounds: Normal breath sounds.  Abdominal:     Palpations: Abdomen is soft.     Tenderness: There is no abdominal tenderness.   Musculoskeletal:        General: Swelling and tenderness present.     Cervical back: Neck supple.  Skin:    General: Skin is warm and dry.     Capillary Refill: Capillary refill takes less than 2 seconds.  Neurological:     Mental Status: She is alert.  Psychiatric:        Mood and Affect: Mood normal.     ED Results and Treatments Labs (all labs ordered are listed, but only abnormal results are displayed) Labs Reviewed  COMPREHENSIVE METABOLIC PANEL - Abnormal; Notable for the following components:      Result Value   Glucose, Bld 297 (*)    Creatinine, Ser 1.33 (*)    Calcium 8.7 (*)    Albumin 3.1 (*)    GFR, Estimated 40 (*)    All other components within normal limits  CBC WITH DIFFERENTIAL/PLATELET - Abnormal; Notable for the following components:   Hemoglobin 11.7 (*)    Monocytes Absolute 1.4 (*)    All other components within normal limits  I-STAT CHEM 8, ED - Abnormal; Notable for the following components:   BUN 25 (*)    Creatinine, Ser 1.30 (*)    Glucose, Bld 293 (*)    All other components within normal limits  CBG MONITORING, ED - Abnormal; Notable for the following components:   Glucose-Capillary 227 (*)    All other components within normal limits  URINALYSIS, ROUTINE W REFLEX MICROSCOPIC  HEMOGLOBIN A1C  Radiology CT CHEST ABDOMEN PELVIS W CONTRAST  Result Date: 05/06/2023 CLINICAL DATA:  Blunt polytrauma. EXAM: CT CHEST, ABDOMEN, AND PELVIS WITH CONTRAST TECHNIQUE: Multidetector CT imaging of the chest, abdomen and pelvis was performed following the standard protocol during bolus administration of intravenous contrast. RADIATION DOSE REDUCTION: This exam was performed according to the departmental dose-optimization program which includes automated exposure control, adjustment of the mA and/or kV according to patient size and/or use of  iterative reconstruction technique. CONTRAST:  75mL OMNIPAQUE IOHEXOL 350 MG/ML SOLN COMPARISON:  None Available. FINDINGS: CT CHEST FINDINGS Cardiovascular: No evidence of thoracic aortic dissection or aneurysm. Coronary artery calcifications are noted. Normal heart size. No pericardial effusion. Mediastinum/Nodes: No enlarged mediastinal, hilar, or axillary lymph nodes. Thyroid gland, trachea, and esophagus demonstrate no significant findings. Lungs/Pleura: No pneumothorax or pleural effusion is noted. 4 mm nodule is noted in right upper lobe best seen on image number 57 of series 7. Musculoskeletal: No chest wall mass or suspicious bone lesions identified. CT ABDOMEN PELVIS FINDINGS Hepatobiliary: Status post cholecystectomy. Mild intrahepatic and extrahepatic biliary dilatation is noted consistent with post cholecystectomy status. Liver is unremarkable. Pancreas: 16 x 9 mm calculus is noted in the pancreatic duct in the pancreatic head resulting in dilatation of more proximal pancreatic duct and some degree of pancreatic atrophy. Spleen: Calcified granulomas are noted in the spleen. No other abnormality seen. Adrenals/Urinary Tract: Adrenal glands and kidneys appear normal. No hydronephrosis or renal obstruction is noted. Mild urinary bladder distention is noted. Stomach/Bowel: Stomach is unremarkable. Status post appendectomy. No evidence of bowel obstruction or inflammation. Vascular/Lymphatic: Aortic atherosclerosis. No enlarged abdominal or pelvic lymph nodes. Reproductive: Status post hysterectomy. No adnexal masses. Other: No abdominal wall hernia or abnormality. No abdominopelvic ascites. Musculoskeletal: No acute or significant osseous findings. IMPRESSION: No evidence of traumatic injury seen in the chest, abdomen or pelvis. Mild urinary bladder distention is noted. 4 mm nodule is noted in right upper lobe. No follow-up needed if patient is low-risk.This recommendation follows the consensus statement:  Guidelines for Management of Incidental Pulmonary Nodules Detected on CT Images: From the Fleischner Society 2017; Radiology 2017; 284:228-243. 16 x 9 mm calculus is noted in the pancreatic duct in the pancreatic head resulting in dilatation of more proximal pancreatic duct and some degree of pancreatic atrophy. Coronary artery calcifications are noted suggesting coronary artery disease. Aortic Atherosclerosis (ICD10-I70.0). Electronically Signed   By: Lupita Raider M.D.   On: 05/06/2023 13:04   CT HEAD WO CONTRAST ( )  Result Date: 05/06/2023 CLINICAL DATA:  Head and neck trauma EXAM: CT HEAD WITHOUT CONTRAST CT CERVICAL SPINE WITHOUT CONTRAST TECHNIQUE: Multidetector CT imaging of the head and cervical spine was performed following the standard protocol without intravenous contrast. Multiplanar CT image reconstructions of the cervical spine were also generated. RADIATION DOSE REDUCTION: This exam was performed according to the departmental dose-optimization program which includes automated exposure control, adjustment of the mA and/or kV according to patient size and/or use of iterative reconstruction technique. COMPARISON:  06/05/2018 CT head, no prior CT cervical spine available FINDINGS: CT HEAD FINDINGS Brain: No evidence of acute infarct, hemorrhage, mass, mass effect, or midline shift. No hydrocephalus or extra-axial fluid collection. Periventricular white matter changes, likely the sequela of chronic small vessel ischemic disease. Partial empty sella normal craniocervical junction. Vascular: No hyperdense vessel. Atherosclerotic calcifications in the intracranial carotid and vertebral arteries. Skull: Negative for fracture or focal lesion. Hyperostosis frontalis. Sinuses/Orbits: Mild mucosal thickening in the ethmoid air cells and left  sphenoid sinus. Status post right lens replacement. Other: The mastoid air cells are well aerated. CT CERVICAL SPINE FINDINGS Alignment: No traumatic listhesis. Skull  base and vertebrae: No acute fracture or suspicious osseous lesion. Status post ACDF C4-C6. Soft tissues and spinal canal: No prevertebral fluid or swelling. No visible canal hematoma. Disc levels: Degenerative changes in the cervical spine. Moderate spinal canal stenosis at C3-C4 and mild-to-moderate spinal canal stenosis at C6-C7. Upper chest: Negative. IMPRESSION: 1. No acute intracranial process. 2. No acute fracture or traumatic listhesis in the cervical spine. Electronically Signed   By: Wiliam Ke M.D.   On: 05/06/2023 12:54   CT CERVICAL SPINE WO CONTRAST  Result Date: 05/06/2023 CLINICAL DATA:  Head and neck trauma EXAM: CT HEAD WITHOUT CONTRAST CT CERVICAL SPINE WITHOUT CONTRAST TECHNIQUE: Multidetector CT imaging of the head and cervical spine was performed following the standard protocol without intravenous contrast. Multiplanar CT image reconstructions of the cervical spine were also generated. RADIATION DOSE REDUCTION: This exam was performed according to the departmental dose-optimization program which includes automated exposure control, adjustment of the mA and/or kV according to patient size and/or use of iterative reconstruction technique. COMPARISON:  06/05/2018 CT head, no prior CT cervical spine available FINDINGS: CT HEAD FINDINGS Brain: No evidence of acute infarct, hemorrhage, mass, mass effect, or midline shift. No hydrocephalus or extra-axial fluid collection. Periventricular white matter changes, likely the sequela of chronic small vessel ischemic disease. Partial empty sella normal craniocervical junction. Vascular: No hyperdense vessel. Atherosclerotic calcifications in the intracranial carotid and vertebral arteries. Skull: Negative for fracture or focal lesion. Hyperostosis frontalis. Sinuses/Orbits: Mild mucosal thickening in the ethmoid air cells and left sphenoid sinus. Status post right lens replacement. Other: The mastoid air cells are well aerated. CT CERVICAL SPINE  FINDINGS Alignment: No traumatic listhesis. Skull base and vertebrae: No acute fracture or suspicious osseous lesion. Status post ACDF C4-C6. Soft tissues and spinal canal: No prevertebral fluid or swelling. No visible canal hematoma. Disc levels: Degenerative changes in the cervical spine. Moderate spinal canal stenosis at C3-C4 and mild-to-moderate spinal canal stenosis at C6-C7. Upper chest: Negative. IMPRESSION: 1. No acute intracranial process. 2. No acute fracture or traumatic listhesis in the cervical spine. Electronically Signed   By: Wiliam Ke M.D.   On: 05/06/2023 12:54   DG Ankle Complete Right  Result Date: 05/06/2023 CLINICAL DATA:  fall, r/o fx.  Right ankle pain. EXAM: RIGHT ANKLE - COMPLETE 3+ VIEW COMPARISON:  None Available. FINDINGS: No acute fracture or dislocation. No aggressive osseous lesion. Ankle mortise appears intact. Calcaneal spur noted along the Achilles tendon and Plantar aponeurosis attachment sites. There are diffuse moderate degenerative osteoarthritic changes of imaged joints. There is mild diffuse soft tissue swelling about the ankle joint, most likely due to systemic causes. No radiopaque foreign bodies. IMPRESSION: 1. No acute fracture or dislocation. 2. Moderate degenerative osteoarthritis. 3. Calcaneal spur. Electronically Signed   By: Jules Schick M.D.   On: 05/06/2023 09:32    Pertinent labs & imaging results that were available during my care of the patient were reviewed by me and considered in my medical decision making (see MDM for details).  Medications Ordered in ED Medications  acetaminophen (TYLENOL) tablet 500 mg (has no administration in time range)  ALPRAZolam (XANAX) tablet 0.25 mg (has no administration in time range)  aspirin EC tablet 81 mg (81 mg Oral Given 05/06/23 1515)  atorvastatin (LIPITOR) tablet 40 mg (40 mg Oral Given 05/06/23 1514)  donepezil (ARICEPT) tablet  5 mg (5 mg Oral Given 05/06/23 1514)  empagliflozin (JARDIANCE) tablet 10 mg  (10 mg Oral Given 05/06/23 1515)  ezetimibe (ZETIA) tablet 10 mg (10 mg Oral Given 05/06/23 1514)  metoprolol tartrate (LOPRESSOR) tablet 50 mg (50 mg Oral Given 05/06/23 1514)  apixaban (ELIQUIS) tablet 5 mg ( Oral Canceled Entry 05/06/23 1616)  irbesartan (AVAPRO) tablet 150 mg (150 mg Oral Given 05/06/23 1514)  insulin aspart (novoLOG) injection 0-15 Units (has no administration in time range)  insulin aspart (novoLOG) injection 0-5 Units (has no administration in time range)  HYDROcodone-acetaminophen (NORCO/VICODIN) 5-325 MG per tablet 1 tablet (1 tablet Oral Given 05/06/23 0916)  iohexol (OMNIPAQUE) 350 MG/ML injection 75 mL (75 mLs Intravenous Contrast Given 05/06/23 1139)  HYDROcodone-acetaminophen (NORCO/VICODIN) 5-325 MG per tablet 1 tablet (1 tablet Oral Given 05/06/23 1514)                                                                                                                                     Procedures Procedures  (including critical care time)  Medical Decision Making / ED Course   This patient presents to the ED for concern of frequent falls, flank pain, ankle pain, this involves an extensive number of treatment options, and is a complaint that carries with it a high risk of complications and morbidity.  The differential diagnosis includes fracture, hematoma, contusion, ligamentous injury, electrolyte abnormality, deconditioning  MDM: Patient seen emerged part for evaluation of ankle pain, flank pain and frequent falls.  Physical exam with right-sided flank and rib pain, tenderness and swelling over the right ankle.  Small appropriately healing wound seen on the right ankle.  Laboratory evaluation with a creatinine of 1.33, hemoglobin 11.7 but is otherwise unremarkable.  Trauma imaging including x-ray ankle, CT head, C-spine, chest abdomen pelvis reassuringly negative for acute traumatic injury.  There is a stone in the pancreas found incidentally but do have low suspicion that this  is clinically contributing to the patient's presentation today.  Daughter concerned about caring for the patient at home and requesting PT eval.  Thus, PT consultation placed as well as TOC consult.  These recommendations are pending.  Please see provider signout for continuation of workup.   Additional history obtained: -Additional history obtained from daughter -External records from outside source obtained and reviewed including: Chart review including previous notes, labs, imaging, consultation notes   Lab Tests: -I ordered, reviewed, and interpreted labs.   The pertinent results include:   Labs Reviewed  COMPREHENSIVE METABOLIC PANEL - Abnormal; Notable for the following components:      Result Value   Glucose, Bld 297 (*)    Creatinine, Ser 1.33 (*)    Calcium 8.7 (*)    Albumin 3.1 (*)    GFR, Estimated 40 (*)    All other components within normal limits  CBC WITH DIFFERENTIAL/PLATELET - Abnormal; Notable for the following components:   Hemoglobin 11.7 (*)  Monocytes Absolute 1.4 (*)    All other components within normal limits  I-STAT CHEM 8, ED - Abnormal; Notable for the following components:   BUN 25 (*)    Creatinine, Ser 1.30 (*)    Glucose, Bld 293 (*)    All other components within normal limits  CBG MONITORING, ED - Abnormal; Notable for the following components:   Glucose-Capillary 227 (*)    All other components within normal limits  URINALYSIS, ROUTINE W REFLEX MICROSCOPIC  HEMOGLOBIN A1C      EKG   EKG Interpretation Date/Time:  Friday May 06 2023 08:55:09 EDT Ventricular Rate:  74 PR Interval:    QRS Duration:  117 QT Interval:  410 QTC Calculation: 455 R Axis:   -51  Text Interpretation: Atrial fibrillation Incomplete left bundle branch block LVH with secondary repolarization abnormality Anterior Q waves, possibly due to LVH Confirmed by Krzysztof Reichelt ([id number]) on 05/06/2023 6:04:06 PM         Imaging Studies ordered: I ordered imaging  studies including x-ray ankle, CT head, C-spine, chest abdomen pelvis I independently visualized and interpreted imaging. I agree with the radiologist interpretation   Medicines ordered and prescription drug management: Meds ordered this encounter  Medications   HYDROcodone-acetaminophen (NORCO/VICODIN) 5-325 MG per tablet 1 tablet   iohexol (OMNIPAQUE) 350 MG/ML injection 75 mL   HYDROcodone-acetaminophen (NORCO/VICODIN) 5-325 MG per tablet 1 tablet   acetaminophen (TYLENOL) tablet 500 mg   ALPRAZolam (XANAX) tablet 0.25 mg   aspirin EC tablet 81 mg   atorvastatin (LIPITOR) tablet 40 mg   donepezil (ARICEPT) tablet 5 mg   empagliflozin (JARDIANCE) tablet 10 mg   ezetimibe (ZETIA) tablet 10 mg   metoprolol tartrate (LOPRESSOR) tablet 50 mg   apixaban (ELIQUIS) tablet 5 mg   irbesartan (AVAPRO) tablet 150 mg   insulin aspart (novoLOG) injection 0-15 Units    Order Specific Question:   Correction coverage:    Answer:   Moderate (average weight, post-op)    Order Specific Question:   CBG < 70:    Answer:   Implement Hypoglycemia Standing Orders and refer to Hypoglycemia Standing Orders sidebar report    Order Specific Question:   CBG 70 - 120:    Answer:   0 units    Order Specific Question:   CBG 121 - 150:    Answer:   2 units    Order Specific Question:   CBG 151 - 200:    Answer:   3 units    Order Specific Question:   CBG 201 - 250:    Answer:   5 units    Order Specific Question:   CBG 251 - 300:    Answer:   8 units    Order Specific Question:   CBG 301 - 350:    Answer:   11 units    Order Specific Question:   CBG 351 - 400:    Answer:   15 units    Order Specific Question:   CBG > 400    Answer:   call MD and obtain STAT lab verification   insulin aspart (novoLOG) injection 0-5 Units    Order Specific Question:   Correction coverage:    Answer:   HS scale    Order Specific Question:   CBG < 70:    Answer:   Implement Hypoglycemia Standing Orders and refer to  Hypoglycemia Standing Orders sidebar report    Order Specific Question:   CBG 70 -  120:    Answer:   0 units    Order Specific Question:   CBG 121 - 150:    Answer:   0 units    Order Specific Question:   CBG 151 - 200:    Answer:   0 units    Order Specific Question:   CBG 201 - 250:    Answer:   2 units    Order Specific Question:   CBG 251 - 300:    Answer:   3 units    Order Specific Question:   CBG 301 - 350:    Answer:   4 units    Order Specific Question:   CBG 351 - 400:    Answer:   5 units    Order Specific Question:   CBG > 400    Answer:   call MD and obtain STAT lab verification    -I have reviewed the patients home medicines and have made adjustments as needed  Critical interventions none  Consultations Obtained: I requested consultation with the [facility] team,  and discussed lab and imaging findings as well as pertinent plan - they recommend: recs pending   Cardiac Monitoring: The patient was maintained on a cardiac monitor.  I personally viewed and interpreted the cardiac monitored which showed an underlying rhythm of: afib  Social Determinants of Health:  Factors impacting patients care include: Her daughter having difficulty caring for the patient at home   Reevaluation: After the interventions noted above, I reevaluated the patient and found that they have :stayed the same  Co morbidities that complicate the patient evaluation  Past Medical History:  Diagnosis Date   Anemia    s/p Heme work -up normal EGD and colonscopy in 2012 per pt,neg SPEP   Anxiety    Aortic stenosis    Mild by echo 12/2021 with mean aortic valve gradient 17 mmHg   Bilateral carotid artery stenosis 06/08/2015   1-39% bilateral   Bradycardia    CKD (chronic kidney disease), stage III (HCC)    Diabetes mellitus without complication (HCC)    type 2   GERD (gastroesophageal reflux disease)    Gout    HSV-1 (herpes simplex virus 1) infection    Acyclovir prn   Hyperkalemia     Hyperlipidemia    Hypertension    Joint pain    osteoarthritis by Xray- possible erosion of R 4th MCP,elevated uric  acid ,ANA +    MCI (mild cognitive impairment)    Morbid obesity (HCC)    OSA on CPAP    Osteopenia    PAF (paroxysmal atrial fibrillation) (HCC)    Pain management    Neurosurg: Dr Odette Fraction   Pulmonary HTN (HCC)    Moderate by echo  2016. PASP   TIA (transient ischemic attack)    remote history of TIA's early 2000      Dispostion: I considered admission for this patient, and disposition pending PT and TOC evaluation.  Please see provider signout continuation of workup.     Final Clinical Impression(s) / ED Diagnoses Final diagnoses:  None     @PCDICTATION @    Glendora Score, MD 05/06/23 [phone]

## 2023-05-07 LAB — URINALYSIS, ROUTINE W REFLEX MICROSCOPIC
Bilirubin Urine: NEGATIVE
Glucose, UA: >=500 mg/dL — AB
Ketones, ur: NEGATIVE mg/dL
Nitrite: NEGATIVE
Protein, ur: 30 mg/dL — AB
Specific Gravity, Urine: 1.029 (ref 1.005–1.030)
WBC, UA: >50 WBC/hpf (ref 0–5)
pH: 6 (ref 5.0–8.0)

## 2023-05-07 LAB — CBG MONITORING, ED
Glucose-Capillary: 137 mg/dL — ABNORMAL HIGH (ref 70–99)
Glucose-Capillary: 149 mg/dL — ABNORMAL HIGH (ref 70–99)
Glucose-Capillary: 117 mg/dL — ABNORMAL HIGH (ref 70–99)
Glucose-Capillary: 169 mg/dL — ABNORMAL HIGH (ref 70–99)

## 2023-05-07 MED ORDER — LORAZEPAM 2 MG/ML IJ SOLN
1.0000 mg | Freq: Once | INTRAMUSCULAR | Status: AC
  Administered 2023-05-07: 1 mg via INTRAMUSCULAR
  Filled 2023-05-07: qty 1

## 2023-05-07 MED ORDER — HYDROCODONE-ACETAMINOPHEN 5-325 MG PO TABS
1.0000 | ORAL_TABLET | Freq: Once | ORAL | Status: AC
  Administered 2023-05-07: 1 via ORAL
  Filled 2023-05-07: qty 1

## 2023-05-07 MED ORDER — LIDOCAINE 5 % EX PTCH
1.0000 | MEDICATED_PATCH | CUTANEOUS | Status: DC
  Administered 2023-05-07 – 2023-05-08 (×2): 1 via TRANSDERMAL
  Filled 2023-05-07 (×2): qty 1

## 2023-05-07 MED ORDER — CEPHALEXIN 250 MG PO CAPS
500.0000 mg | ORAL_CAPSULE | Freq: Two times a day (BID) | ORAL | Status: DC
  Administered 2023-05-07 – 2023-05-09 (×5): 500 mg via ORAL
  Filled 2023-05-07 (×6): qty 2

## 2023-05-07 NOTE — ED Notes (Addendum)
Patient in room yelling out for family member stating that she is falling. Went to room reassured patient that she was not falling and was safe. Patient not redirectable. Messaged MD Lawsing to reorder Xanax for patient.

## 2023-05-07 NOTE — ED Provider Notes (Addendum)
Emergency Medicine Observation Re-evaluation Note  Tina Hester is a 82 y.o. female, seen on rounds today.  Pt initially presented to the ED for complaints of Fall Currently, the patient is resting comfortably.  Physical Exam  BP (!) 157/65   Pulse 88   Temp 98.4 F (36.9 C) (Oral)   Resp 20   SpO2 98%  Physical Exam General: NAD   ED Course / MDM  EKG:EKG Interpretation Date/Time:  Friday May 06 2023 08:55:09 EDT Ventricular Rate:  74 PR Interval:    QRS Duration:  117 QT Interval:  410 QTC Calculation: 455 R Axis:   -51  Text Interpretation: Atrial fibrillation Incomplete left bundle branch block LVH with secondary repolarization abnormality Anterior Q waves, possibly due to LVH Confirmed by Kommor, Madison ([id number]) on 05/06/2023 6:04:06 PM  I have reviewed the labs performed to date as well as medications administered while in observation.  Recent changes in the last 24 hours include SW coordinating facility placement..  Plan  Current plan is for placement, SW coordinating.    Ernie Avena, MD 05/07/23 1154  Pt on Keflex for a UTI, which could be causing an acute on chronic worsening of her dementia. Hemodynamically stable. Coordinated with SW and Dr. Logan Bores of [facility] to facilitate facility placement for the patient.    Ernie Avena, MD 05/07/23 [phone]

## 2023-05-07 NOTE — ED Notes (Signed)
Hospital bed ordered per family request, hospital bed brought down to ED however pt is refusing at this time to be moved over into more comfortable bed.

## 2023-05-07 NOTE — ED Notes (Signed)
Patient refuses to be turned or repositioned with a pillow under buttocks/hips. She reports it is too painful to move. Patient has been given pain medication prior to repositioning to assist in her comfort. Bilateral heels elevated off of mattress to reduce pressure.

## 2023-05-07 NOTE — ED Notes (Signed)
Patient continues to refuse to be fully turned or repositioned with a pillow under buttocks/hips. Pt declined pain medication to assist with repositioning. Bilateral heels remain elevated off of mattress to reduce pressure.

## 2023-05-07 NOTE — ED Notes (Signed)
Pt rolled and wet linen removed from underneath with clean linen and peri care provided. Pt repositioned and resting comfortably.

## 2023-05-07 NOTE — ED Notes (Signed)
Shift report received, assumed care of patient at this time 

## 2023-05-07 NOTE — Progress Notes (Addendum)
2:30pm: CSW spoke with patient's daughter [name] to inform her information.  All questions answered and no further needs at this time.  12pm: CSW spoke with Christi at [facility] who states the bed offer for the patient is still available. Christi states she will not have a bed for patient until Monday. CSW informed her of peer to peer request for [facility] auth.  CSW added Dr. Karene Fry to existing secure chat with peer to peer request details.  11am: CSW received call from [place] at [facility] who states Dr. Logan Bores, medical director wants to speak with MD regarding patient.  CSW sent secure chat to Dr. Rodena Medin notifying him of request.  9am: CSW spoke with Judeth Cornfield at [facility] to initiate insurance authorization.   CSW attempted to reach [place] in admissions at [facility] without success - a text message was sent requesting a return call.  Edwin Dada, MSW, LCSW Transitions of Care  Clinical Social Worker II [phone]

## 2023-05-08 LAB — CBG MONITORING, ED
Glucose-Capillary: 160 mg/dL — ABNORMAL HIGH (ref 70–99)
Glucose-Capillary: 187 mg/dL — ABNORMAL HIGH (ref 70–99)
Glucose-Capillary: 187 mg/dL — ABNORMAL HIGH (ref 70–99)
Glucose-Capillary: 142 mg/dL — ABNORMAL HIGH (ref 70–99)

## 2023-05-08 NOTE — ED Notes (Signed)
Full set of vital signs document in pt chart. Pt on purewick, canister empty. Pt reposition, and pt placed herself back towards the left side of the bed. Pt yelling and moaning during reposition. Nurse notified.

## 2023-05-08 NOTE — ED Notes (Signed)
Place pt into a gown and out of home gown

## 2023-05-08 NOTE — ED Notes (Signed)
PT ate about 50% of her [place] and gravy and mashed potatoes. Desires no more food.

## 2023-05-08 NOTE — ED Provider Notes (Signed)
Emergency Medicine Observation Re-evaluation Note  Tina Hester is a 82 y.o. female, seen on rounds today.  Pt initially presented to the ED for complaints of Fall Currently, the patient is resting.  Physical Exam  BP (!) 162/91 (BP Location: Right Wrist)   Pulse (!) 110   Temp 98 F (36.7 C) (Oral)   Resp 17   Ht 5\' 1"  (1.549 m)   Wt 90.7 kg   SpO2 98%   BMI 37.78 kg/m  Physical Exam General: NAD   ED Course / MDM  EKG:EKG Interpretation Date/Time:  Friday May 06 2023 08:55:09 EDT Ventricular Rate:  74 PR Interval:    QRS Duration:  117 QT Interval:  410 QTC Calculation: 455 R Axis:   -51  Text Interpretation: Atrial fibrillation Incomplete left bundle branch block LVH with secondary repolarization abnormality Anterior Q waves, possibly due to LVH Confirmed by Kommor, Madison ([id number]) on 05/06/2023 6:04:06 PM  I have reviewed the labs performed to date as well as medications administered while in observation.  Recent changes in the last 24 hours include pt approved for [facility] SNF tomorrow 9/9.  Plan  Current plan is for placement, SW coordinating. Pt approved for [facility] SNF tomorrow 9/9.       Ernie Avena, MD 05/08/23 1140

## 2023-05-08 NOTE — Progress Notes (Signed)
CSW received approval for SNF from [facility] [phone], [facility] [phone]. The next review date is 05/14/23.  Current plan is for patient to discharge to [facility] tomorrow.  Edwin Dada, MSW, LCSW Transitions of Care  Clinical Social Worker II [phone]

## 2023-05-09 DIAGNOSIS — Z9181 History of falling: Secondary | ICD-10-CM | POA: Insufficient documentation

## 2023-05-09 DIAGNOSIS — Z794 Long term (current) use of insulin: Secondary | ICD-10-CM | POA: Insufficient documentation

## 2023-05-09 DIAGNOSIS — E119 Type 2 diabetes mellitus without complications: Secondary | ICD-10-CM | POA: Diagnosis not present

## 2023-05-09 DIAGNOSIS — I509 Heart failure, unspecified: Secondary | ICD-10-CM | POA: Diagnosis not present

## 2023-05-09 DIAGNOSIS — E1122 Type 2 diabetes mellitus with diabetic chronic kidney disease: Secondary | ICD-10-CM | POA: Diagnosis not present

## 2023-05-09 DIAGNOSIS — E039 Hypothyroidism, unspecified: Secondary | ICD-10-CM | POA: Diagnosis not present

## 2023-05-09 DIAGNOSIS — N1832 Chronic kidney disease, stage 3b: Secondary | ICD-10-CM | POA: Diagnosis not present

## 2023-05-09 DIAGNOSIS — K219 Gastro-esophageal reflux disease without esophagitis: Secondary | ICD-10-CM | POA: Diagnosis not present

## 2023-05-09 DIAGNOSIS — I4891 Unspecified atrial fibrillation: Secondary | ICD-10-CM | POA: Diagnosis not present

## 2023-05-09 DIAGNOSIS — M25571 Pain in right ankle and joints of right foot: Secondary | ICD-10-CM | POA: Insufficient documentation

## 2023-05-09 DIAGNOSIS — F419 Anxiety disorder, unspecified: Secondary | ICD-10-CM | POA: Diagnosis not present

## 2023-05-09 DIAGNOSIS — I130 Hypertensive heart and chronic kidney disease with heart failure and stage 1 through stage 4 chronic kidney disease, or unspecified chronic kidney disease: Secondary | ICD-10-CM | POA: Insufficient documentation

## 2023-05-09 DIAGNOSIS — D631 Anemia in chronic kidney disease: Secondary | ICD-10-CM | POA: Diagnosis not present

## 2023-05-09 DIAGNOSIS — L89613 Pressure ulcer of right heel, stage 3: Secondary | ICD-10-CM | POA: Diagnosis not present

## 2023-05-09 DIAGNOSIS — I10 Essential (primary) hypertension: Secondary | ICD-10-CM | POA: Diagnosis not present

## 2023-05-09 DIAGNOSIS — Z7401 Bed confinement status: Secondary | ICD-10-CM | POA: Diagnosis not present

## 2023-05-09 DIAGNOSIS — R4182 Altered mental status, unspecified: Secondary | ICD-10-CM | POA: Diagnosis not present

## 2023-05-09 DIAGNOSIS — F0390 Unspecified dementia without behavioral disturbance: Secondary | ICD-10-CM | POA: Diagnosis not present

## 2023-05-09 DIAGNOSIS — E785 Hyperlipidemia, unspecified: Secondary | ICD-10-CM | POA: Diagnosis not present

## 2023-05-09 DIAGNOSIS — Z8673 Personal history of transient ischemic attack (TIA), and cerebral infarction without residual deficits: Secondary | ICD-10-CM | POA: Diagnosis not present

## 2023-05-09 DIAGNOSIS — R296 Repeated falls: Secondary | ICD-10-CM | POA: Diagnosis not present

## 2023-05-09 DIAGNOSIS — R5381 Other malaise: Secondary | ICD-10-CM | POA: Diagnosis not present

## 2023-05-09 DIAGNOSIS — B029 Zoster without complications: Secondary | ICD-10-CM | POA: Diagnosis not present

## 2023-05-09 DIAGNOSIS — I480 Paroxysmal atrial fibrillation: Secondary | ICD-10-CM | POA: Diagnosis not present

## 2023-05-09 DIAGNOSIS — F329 Major depressive disorder, single episode, unspecified: Secondary | ICD-10-CM | POA: Diagnosis not present

## 2023-05-09 DIAGNOSIS — F411 Generalized anxiety disorder: Secondary | ICD-10-CM | POA: Diagnosis not present

## 2023-05-09 DIAGNOSIS — M6281 Muscle weakness (generalized): Secondary | ICD-10-CM | POA: Diagnosis not present

## 2023-05-09 LAB — CBG MONITORING, ED: Glucose-Capillary: 166 mg/dL — ABNORMAL HIGH (ref 70–99)

## 2023-05-09 NOTE — ED Notes (Signed)
Called [facility]  ETA 45 minute

## 2023-05-09 NOTE — ED Notes (Signed)
Attempted to call report. No one answered either number. [phone]  or  [phone]  [address]   [place], [place]

## 2023-05-09 NOTE — ED Provider Notes (Signed)
Patient resting, vital signs unremarkable, she is aware of discharge, per nursing.   Gerhard Munch, MD 05/09/23 [phone]

## 2023-05-09 NOTE — Progress Notes (Signed)
Patient is discharging and going to [facility]. Patient will go to room 309. Number for report [phone]. Information given to patients RN.

## 2023-05-09 NOTE — ED Notes (Signed)
Report called to Debo, nurse at [facility]

## 2023-05-10 ENCOUNTER — Encounter (HOSPITAL_BASED_OUTPATIENT_CLINIC_OR_DEPARTMENT_OTHER): Payer: PPO | Admitting: Internal Medicine

## 2023-05-11 DIAGNOSIS — E119 Type 2 diabetes mellitus without complications: Secondary | ICD-10-CM | POA: Diagnosis not present

## 2023-05-11 DIAGNOSIS — M6281 Muscle weakness (generalized): Secondary | ICD-10-CM | POA: Diagnosis not present

## 2023-05-11 DIAGNOSIS — E039 Hypothyroidism, unspecified: Secondary | ICD-10-CM | POA: Diagnosis not present

## 2023-05-11 DIAGNOSIS — F411 Generalized anxiety disorder: Secondary | ICD-10-CM | POA: Diagnosis not present

## 2023-05-11 DIAGNOSIS — F329 Major depressive disorder, single episode, unspecified: Secondary | ICD-10-CM | POA: Diagnosis not present

## 2023-05-11 DIAGNOSIS — K219 Gastro-esophageal reflux disease without esophagitis: Secondary | ICD-10-CM | POA: Diagnosis not present

## 2023-05-11 DIAGNOSIS — I4891 Unspecified atrial fibrillation: Secondary | ICD-10-CM | POA: Diagnosis not present

## 2023-05-11 DIAGNOSIS — R296 Repeated falls: Secondary | ICD-10-CM | POA: Diagnosis not present

## 2023-05-11 DIAGNOSIS — I10 Essential (primary) hypertension: Secondary | ICD-10-CM | POA: Diagnosis not present

## 2023-05-13 DIAGNOSIS — E119 Type 2 diabetes mellitus without complications: Secondary | ICD-10-CM | POA: Diagnosis not present

## 2023-05-13 DIAGNOSIS — F329 Major depressive disorder, single episode, unspecified: Secondary | ICD-10-CM | POA: Diagnosis not present

## 2023-05-13 DIAGNOSIS — E039 Hypothyroidism, unspecified: Secondary | ICD-10-CM | POA: Diagnosis not present

## 2023-05-13 DIAGNOSIS — F411 Generalized anxiety disorder: Secondary | ICD-10-CM | POA: Diagnosis not present

## 2023-05-13 DIAGNOSIS — I4891 Unspecified atrial fibrillation: Secondary | ICD-10-CM | POA: Diagnosis not present

## 2023-05-13 DIAGNOSIS — M6281 Muscle weakness (generalized): Secondary | ICD-10-CM | POA: Diagnosis not present

## 2023-05-13 DIAGNOSIS — K219 Gastro-esophageal reflux disease without esophagitis: Secondary | ICD-10-CM | POA: Diagnosis not present

## 2023-05-13 DIAGNOSIS — I10 Essential (primary) hypertension: Secondary | ICD-10-CM | POA: Diagnosis not present

## 2023-05-13 DIAGNOSIS — R296 Repeated falls: Secondary | ICD-10-CM | POA: Diagnosis not present

## 2023-05-16 DIAGNOSIS — I4891 Unspecified atrial fibrillation: Secondary | ICD-10-CM | POA: Diagnosis not present

## 2023-05-16 DIAGNOSIS — E039 Hypothyroidism, unspecified: Secondary | ICD-10-CM | POA: Diagnosis not present

## 2023-05-16 DIAGNOSIS — E119 Type 2 diabetes mellitus without complications: Secondary | ICD-10-CM | POA: Diagnosis not present

## 2023-05-16 DIAGNOSIS — M6281 Muscle weakness (generalized): Secondary | ICD-10-CM | POA: Diagnosis not present

## 2023-05-16 DIAGNOSIS — F411 Generalized anxiety disorder: Secondary | ICD-10-CM | POA: Diagnosis not present

## 2023-05-16 DIAGNOSIS — I10 Essential (primary) hypertension: Secondary | ICD-10-CM | POA: Diagnosis not present

## 2023-05-16 DIAGNOSIS — K219 Gastro-esophageal reflux disease without esophagitis: Secondary | ICD-10-CM | POA: Diagnosis not present

## 2023-05-16 DIAGNOSIS — F329 Major depressive disorder, single episode, unspecified: Secondary | ICD-10-CM | POA: Diagnosis not present

## 2023-05-16 DIAGNOSIS — R296 Repeated falls: Secondary | ICD-10-CM | POA: Diagnosis not present

## 2023-05-17 DIAGNOSIS — R5381 Other malaise: Secondary | ICD-10-CM | POA: Diagnosis not present

## 2023-05-17 DIAGNOSIS — E119 Type 2 diabetes mellitus without complications: Secondary | ICD-10-CM | POA: Diagnosis not present

## 2023-05-17 DIAGNOSIS — E785 Hyperlipidemia, unspecified: Secondary | ICD-10-CM | POA: Diagnosis not present

## 2023-05-18 DIAGNOSIS — M6281 Muscle weakness (generalized): Secondary | ICD-10-CM | POA: Diagnosis not present

## 2023-05-18 DIAGNOSIS — I10 Essential (primary) hypertension: Secondary | ICD-10-CM | POA: Diagnosis not present

## 2023-05-18 DIAGNOSIS — K219 Gastro-esophageal reflux disease without esophagitis: Secondary | ICD-10-CM | POA: Diagnosis not present

## 2023-05-18 DIAGNOSIS — R5381 Other malaise: Secondary | ICD-10-CM | POA: Diagnosis not present

## 2023-05-18 DIAGNOSIS — F411 Generalized anxiety disorder: Secondary | ICD-10-CM | POA: Diagnosis not present

## 2023-05-18 DIAGNOSIS — R296 Repeated falls: Secondary | ICD-10-CM | POA: Diagnosis not present

## 2023-05-18 DIAGNOSIS — I4891 Unspecified atrial fibrillation: Secondary | ICD-10-CM | POA: Diagnosis not present

## 2023-05-18 DIAGNOSIS — E039 Hypothyroidism, unspecified: Secondary | ICD-10-CM | POA: Diagnosis not present

## 2023-05-18 DIAGNOSIS — E119 Type 2 diabetes mellitus without complications: Secondary | ICD-10-CM | POA: Diagnosis not present

## 2023-05-18 DIAGNOSIS — F329 Major depressive disorder, single episode, unspecified: Secondary | ICD-10-CM | POA: Diagnosis not present

## 2023-05-20 DIAGNOSIS — F411 Generalized anxiety disorder: Secondary | ICD-10-CM | POA: Diagnosis not present

## 2023-05-20 DIAGNOSIS — E119 Type 2 diabetes mellitus without complications: Secondary | ICD-10-CM | POA: Diagnosis not present

## 2023-05-20 DIAGNOSIS — B029 Zoster without complications: Secondary | ICD-10-CM | POA: Diagnosis not present

## 2023-05-20 DIAGNOSIS — F329 Major depressive disorder, single episode, unspecified: Secondary | ICD-10-CM | POA: Diagnosis not present

## 2023-05-20 DIAGNOSIS — K219 Gastro-esophageal reflux disease without esophagitis: Secondary | ICD-10-CM | POA: Diagnosis not present

## 2023-05-20 DIAGNOSIS — E039 Hypothyroidism, unspecified: Secondary | ICD-10-CM | POA: Diagnosis not present

## 2023-05-20 DIAGNOSIS — R5381 Other malaise: Secondary | ICD-10-CM | POA: Diagnosis not present

## 2023-05-20 DIAGNOSIS — M6281 Muscle weakness (generalized): Secondary | ICD-10-CM | POA: Diagnosis not present

## 2023-05-20 DIAGNOSIS — I4891 Unspecified atrial fibrillation: Secondary | ICD-10-CM | POA: Diagnosis not present

## 2023-05-20 DIAGNOSIS — R296 Repeated falls: Secondary | ICD-10-CM | POA: Diagnosis not present

## 2023-05-20 DIAGNOSIS — I10 Essential (primary) hypertension: Secondary | ICD-10-CM | POA: Diagnosis not present

## 2023-05-23 DIAGNOSIS — E119 Type 2 diabetes mellitus without complications: Secondary | ICD-10-CM | POA: Diagnosis not present

## 2023-05-23 DIAGNOSIS — R296 Repeated falls: Secondary | ICD-10-CM | POA: Diagnosis not present

## 2023-05-23 DIAGNOSIS — I4891 Unspecified atrial fibrillation: Secondary | ICD-10-CM | POA: Diagnosis not present

## 2023-05-23 DIAGNOSIS — R5381 Other malaise: Secondary | ICD-10-CM | POA: Diagnosis not present

## 2023-05-23 DIAGNOSIS — K219 Gastro-esophageal reflux disease without esophagitis: Secondary | ICD-10-CM | POA: Diagnosis not present

## 2023-05-23 DIAGNOSIS — B029 Zoster without complications: Secondary | ICD-10-CM | POA: Diagnosis not present

## 2023-05-23 DIAGNOSIS — F411 Generalized anxiety disorder: Secondary | ICD-10-CM | POA: Diagnosis not present

## 2023-05-23 DIAGNOSIS — M6281 Muscle weakness (generalized): Secondary | ICD-10-CM | POA: Diagnosis not present

## 2023-05-23 DIAGNOSIS — F329 Major depressive disorder, single episode, unspecified: Secondary | ICD-10-CM | POA: Diagnosis not present

## 2023-05-23 DIAGNOSIS — E039 Hypothyroidism, unspecified: Secondary | ICD-10-CM | POA: Diagnosis not present

## 2023-05-23 DIAGNOSIS — I10 Essential (primary) hypertension: Secondary | ICD-10-CM | POA: Diagnosis not present

## 2023-05-25 DIAGNOSIS — K219 Gastro-esophageal reflux disease without esophagitis: Secondary | ICD-10-CM | POA: Diagnosis not present

## 2023-05-25 DIAGNOSIS — E039 Hypothyroidism, unspecified: Secondary | ICD-10-CM | POA: Diagnosis not present

## 2023-05-25 DIAGNOSIS — F329 Major depressive disorder, single episode, unspecified: Secondary | ICD-10-CM | POA: Diagnosis not present

## 2023-05-25 DIAGNOSIS — M6281 Muscle weakness (generalized): Secondary | ICD-10-CM | POA: Diagnosis not present

## 2023-05-25 DIAGNOSIS — F411 Generalized anxiety disorder: Secondary | ICD-10-CM | POA: Diagnosis not present

## 2023-05-25 DIAGNOSIS — B029 Zoster without complications: Secondary | ICD-10-CM | POA: Diagnosis not present

## 2023-05-25 DIAGNOSIS — R296 Repeated falls: Secondary | ICD-10-CM | POA: Diagnosis not present

## 2023-05-25 DIAGNOSIS — I4891 Unspecified atrial fibrillation: Secondary | ICD-10-CM | POA: Diagnosis not present

## 2023-05-25 DIAGNOSIS — I10 Essential (primary) hypertension: Secondary | ICD-10-CM | POA: Diagnosis not present

## 2023-05-25 DIAGNOSIS — R5381 Other malaise: Secondary | ICD-10-CM | POA: Diagnosis not present

## 2023-05-25 DIAGNOSIS — E119 Type 2 diabetes mellitus without complications: Secondary | ICD-10-CM | POA: Diagnosis not present

## 2023-05-26 DIAGNOSIS — D631 Anemia in chronic kidney disease: Secondary | ICD-10-CM | POA: Diagnosis not present

## 2023-05-26 DIAGNOSIS — E1122 Type 2 diabetes mellitus with diabetic chronic kidney disease: Secondary | ICD-10-CM | POA: Diagnosis not present

## 2023-05-26 DIAGNOSIS — I509 Heart failure, unspecified: Secondary | ICD-10-CM | POA: Diagnosis not present

## 2023-05-26 DIAGNOSIS — L89613 Pressure ulcer of right heel, stage 3: Secondary | ICD-10-CM | POA: Diagnosis not present

## 2023-05-27 DIAGNOSIS — B029 Zoster without complications: Secondary | ICD-10-CM | POA: Diagnosis not present

## 2023-05-27 DIAGNOSIS — R5381 Other malaise: Secondary | ICD-10-CM | POA: Diagnosis not present

## 2023-05-27 DIAGNOSIS — E119 Type 2 diabetes mellitus without complications: Secondary | ICD-10-CM | POA: Diagnosis not present

## 2023-05-27 DIAGNOSIS — F411 Generalized anxiety disorder: Secondary | ICD-10-CM | POA: Diagnosis not present

## 2023-05-27 DIAGNOSIS — E039 Hypothyroidism, unspecified: Secondary | ICD-10-CM | POA: Diagnosis not present

## 2023-05-27 DIAGNOSIS — F329 Major depressive disorder, single episode, unspecified: Secondary | ICD-10-CM | POA: Diagnosis not present

## 2023-05-27 DIAGNOSIS — I10 Essential (primary) hypertension: Secondary | ICD-10-CM | POA: Diagnosis not present

## 2023-05-27 DIAGNOSIS — K219 Gastro-esophageal reflux disease without esophagitis: Secondary | ICD-10-CM | POA: Diagnosis not present

## 2023-05-27 DIAGNOSIS — I4891 Unspecified atrial fibrillation: Secondary | ICD-10-CM | POA: Diagnosis not present

## 2023-05-27 DIAGNOSIS — R296 Repeated falls: Secondary | ICD-10-CM | POA: Diagnosis not present

## 2023-06-02 DIAGNOSIS — Z7901 Long term (current) use of anticoagulants: Secondary | ICD-10-CM | POA: Diagnosis not present

## 2023-06-02 DIAGNOSIS — I4891 Unspecified atrial fibrillation: Secondary | ICD-10-CM | POA: Diagnosis not present

## 2023-06-02 DIAGNOSIS — E039 Hypothyroidism, unspecified: Secondary | ICD-10-CM | POA: Diagnosis not present

## 2023-06-02 DIAGNOSIS — E1143 Type 2 diabetes mellitus with diabetic autonomic (poly)neuropathy: Secondary | ICD-10-CM | POA: Diagnosis not present

## 2023-06-02 DIAGNOSIS — N183 Chronic kidney disease, stage 3 unspecified: Secondary | ICD-10-CM | POA: Diagnosis not present

## 2023-06-02 DIAGNOSIS — E782 Mixed hyperlipidemia: Secondary | ICD-10-CM | POA: Diagnosis not present

## 2023-06-02 DIAGNOSIS — Z87440 Personal history of urinary (tract) infections: Secondary | ICD-10-CM | POA: Diagnosis not present

## 2023-06-02 DIAGNOSIS — K219 Gastro-esophageal reflux disease without esophagitis: Secondary | ICD-10-CM | POA: Diagnosis not present

## 2023-06-02 DIAGNOSIS — Z79891 Long term (current) use of opiate analgesic: Secondary | ICD-10-CM | POA: Diagnosis not present

## 2023-06-02 DIAGNOSIS — R296 Repeated falls: Secondary | ICD-10-CM | POA: Diagnosis not present

## 2023-06-02 DIAGNOSIS — F411 Generalized anxiety disorder: Secondary | ICD-10-CM | POA: Diagnosis not present

## 2023-06-02 DIAGNOSIS — D631 Anemia in chronic kidney disease: Secondary | ICD-10-CM | POA: Diagnosis not present

## 2023-06-02 DIAGNOSIS — F0284 Dementia in other diseases classified elsewhere, unspecified severity, with anxiety: Secondary | ICD-10-CM | POA: Diagnosis not present

## 2023-06-02 DIAGNOSIS — G894 Chronic pain syndrome: Secondary | ICD-10-CM | POA: Diagnosis not present

## 2023-06-02 DIAGNOSIS — R32 Unspecified urinary incontinence: Secondary | ICD-10-CM | POA: Diagnosis not present

## 2023-06-02 DIAGNOSIS — Z8619 Personal history of other infectious and parasitic diseases: Secondary | ICD-10-CM | POA: Diagnosis not present

## 2023-06-02 DIAGNOSIS — E1122 Type 2 diabetes mellitus with diabetic chronic kidney disease: Secondary | ICD-10-CM | POA: Diagnosis not present

## 2023-06-02 DIAGNOSIS — Z794 Long term (current) use of insulin: Secondary | ICD-10-CM | POA: Diagnosis not present

## 2023-06-02 DIAGNOSIS — I509 Heart failure, unspecified: Secondary | ICD-10-CM | POA: Diagnosis not present

## 2023-06-02 DIAGNOSIS — F0283 Dementia in other diseases classified elsewhere, unspecified severity, with mood disturbance: Secondary | ICD-10-CM | POA: Diagnosis not present

## 2023-06-02 DIAGNOSIS — Z7985 Long-term (current) use of injectable non-insulin antidiabetic drugs: Secondary | ICD-10-CM | POA: Diagnosis not present

## 2023-06-02 DIAGNOSIS — Z7982 Long term (current) use of aspirin: Secondary | ICD-10-CM | POA: Diagnosis not present

## 2023-06-02 DIAGNOSIS — L89611 Pressure ulcer of right heel, stage 1: Secondary | ICD-10-CM | POA: Diagnosis not present

## 2023-06-02 DIAGNOSIS — F329 Major depressive disorder, single episode, unspecified: Secondary | ICD-10-CM | POA: Diagnosis not present

## 2023-06-02 DIAGNOSIS — Z7984 Long term (current) use of oral hypoglycemic drugs: Secondary | ICD-10-CM | POA: Diagnosis not present

## 2023-06-02 DIAGNOSIS — Z8673 Personal history of transient ischemic attack (TIA), and cerebral infarction without residual deficits: Secondary | ICD-10-CM | POA: Diagnosis not present

## 2023-06-02 DIAGNOSIS — I130 Hypertensive heart and chronic kidney disease with heart failure and stage 1 through stage 4 chronic kidney disease, or unspecified chronic kidney disease: Secondary | ICD-10-CM | POA: Diagnosis not present

## 2023-06-06 DIAGNOSIS — B0229 Other postherpetic nervous system involvement: Secondary | ICD-10-CM | POA: Diagnosis not present

## 2023-06-06 DIAGNOSIS — I739 Peripheral vascular disease, unspecified: Secondary | ICD-10-CM | POA: Diagnosis not present

## 2023-06-06 DIAGNOSIS — L89602 Pressure ulcer of unspecified heel, stage 2: Secondary | ICD-10-CM | POA: Diagnosis not present

## 2023-06-06 DIAGNOSIS — Z993 Dependence on wheelchair: Secondary | ICD-10-CM | POA: Diagnosis not present

## 2023-06-07 DIAGNOSIS — S93401A Sprain of unspecified ligament of right ankle, initial encounter: Secondary | ICD-10-CM | POA: Diagnosis not present

## 2023-06-07 DIAGNOSIS — W228XXA Striking against or struck by other objects, initial encounter: Secondary | ICD-10-CM | POA: Diagnosis not present

## 2023-06-07 DIAGNOSIS — M7989 Other specified soft tissue disorders: Secondary | ICD-10-CM | POA: Diagnosis not present

## 2023-06-24 ENCOUNTER — Telehealth: Payer: Self-pay | Admitting: Cardiology

## 2023-06-24 NOTE — Telephone Encounter (Signed)
Spoke to patients daughter to see if we could move appt up to 8:30am but patient's daughter states that there is no way that she can get her mother in by 8:30am. FYI

## 2023-06-27 ENCOUNTER — Ambulatory Visit: Payer: PPO | Admitting: Cardiology

## 2023-07-01 ENCOUNTER — Other Ambulatory Visit: Payer: Self-pay | Admitting: Cardiology

## 2023-07-01 DIAGNOSIS — I4819 Other persistent atrial fibrillation: Secondary | ICD-10-CM

## 2023-07-01 MED ORDER — APIXABAN 5 MG PO TABS
5.0000 mg | ORAL_TABLET | Freq: Two times a day (BID) | ORAL | 1 refills | Status: DC
Start: 2023-07-01 — End: 2024-01-02

## 2023-07-01 NOTE — Telephone Encounter (Signed)
*  STAT* If patient is at the pharmacy, call can be transferred to refill team.   1. Which medications need to be refilled? (please list name of each medication and dose if known)   ELIQUIS 5 MG TABS tablet     2. Would you like to learn more about the convenience, safety, & potential cost savings by using the [facility]? NO     3. Are you open to using the [facility] NO   4. Which pharmacy/location (including street and city if local pharmacy) is medication to be sent to?  [id number] - [place], [place] - [address]     5. Do they need a 30 day or 90 day supply? 90   Patient has appt on 11/04 with Dr. Mayford Knife

## 2023-07-01 NOTE — Telephone Encounter (Signed)
Prescription refill request for Eliquis received. Indication: Afib  Last office visit: 05/07/22 Mayford Knife)  Scr: 1.33 (05/06/23)  Age: 82 Weight: 90.7kg  Appropriate dose. Refill sent.

## 2023-07-04 ENCOUNTER — Encounter: Payer: Self-pay | Admitting: Cardiology

## 2023-07-04 ENCOUNTER — Ambulatory Visit: Payer: PPO | Attending: Cardiology | Admitting: Cardiology

## 2023-07-04 VITALS — Ht 60.0 in | Wt 189.0 lb

## 2023-07-04 DIAGNOSIS — I4819 Other persistent atrial fibrillation: Secondary | ICD-10-CM | POA: Diagnosis not present

## 2023-07-04 DIAGNOSIS — R296 Repeated falls: Secondary | ICD-10-CM | POA: Diagnosis not present

## 2023-07-04 DIAGNOSIS — E7800 Pure hypercholesterolemia, unspecified: Secondary | ICD-10-CM | POA: Diagnosis not present

## 2023-07-04 DIAGNOSIS — F0390 Unspecified dementia without behavioral disturbance: Secondary | ICD-10-CM | POA: Diagnosis not present

## 2023-07-04 DIAGNOSIS — I6523 Occlusion and stenosis of bilateral carotid arteries: Secondary | ICD-10-CM | POA: Diagnosis not present

## 2023-07-04 DIAGNOSIS — I509 Heart failure, unspecified: Secondary | ICD-10-CM | POA: Diagnosis not present

## 2023-07-04 DIAGNOSIS — I10 Essential (primary) hypertension: Secondary | ICD-10-CM

## 2023-07-04 DIAGNOSIS — G4733 Obstructive sleep apnea (adult) (pediatric): Secondary | ICD-10-CM

## 2023-07-04 DIAGNOSIS — G894 Chronic pain syndrome: Secondary | ICD-10-CM | POA: Diagnosis not present

## 2023-07-04 DIAGNOSIS — I359 Nonrheumatic aortic valve disorder, unspecified: Secondary | ICD-10-CM | POA: Diagnosis not present

## 2023-07-04 NOTE — Patient Instructions (Addendum)
Medication Instructions:  Please STOP your atorvastatin for 2 weeks. Then call our office to let us know if you feel better. Restarting this medication will be discussed at that time.  *If you need a refill on your cardiac medications before your next appointment, please call your pharmacy*   Lab Work: None.  If you have labs (blood work) drawn today and your tests are completely normal, you will receive your results only by: MyChart Message (if you have MyChart) OR A paper copy in the mail If you have any lab test that is abnormal or we need to change your treatment, we will call you to review the results.   Testing/Procedures: None.   Follow-Up:  Your next appointment:   6 month(s)  Provider:   Armanda Magic, MD

## 2023-07-04 NOTE — Progress Notes (Signed)
Virtual Visit via Video Note   Because of Terrence N Maciejewski's co-morbid illnesses, she is at least at moderate risk for complications without adequate follow up.  This format is felt to be most appropriate for this patient at this time.  All issues noted in this document were discussed and addressed.  A limited physical exam was performed with this format.  Please refer to the patient's chart for her consent to telehealth for [facility].       Date:  07/04/2023   ID:  Tina Hester, DOB 1941/01/10, MRN [id number] The patient was identified using 2 identifiers.  Patient Location: Home Provider Location: Home Office   PCP:  [name], MD   [facility] Providers Cardiologist:  Armanda Magic, MD Electrophysiologist:  Will Jorja Loa, MD     Evaluation Performed:  Follow-Up Visit  Chief Complaint:  PAF, HTN, carotid artery stenosis, aortic stenosis, OSA  History of Present Illness:    Tina Hester is a 82 y.o. female with a hx of PAF, HTN, bilateral carotid artery stenosis, aortic stenosis, morbid obesity and OSA on CPAP.      SHe is here today for followup.  Her daughter also says that for a while she had been having problems with low BP.  Her Lopressor was decreased to 25mg  BID in the ER.   Unfortunately she had a fall a while back and went to rehab for a while.  She then developed a wound on her heel and had to go back to rehab for a while.  She is now living with her daughter because assisted living would not take her back. She is not ambulatory and is wheelchair bound.  She has been seeing a Vascular MD due to issues with circulation in her legs. She ultimately underwent right femoropopliteal angioplasty with stent 02/25/2023.  Her daughter says that occasionally she complains of pain in her chest but no very frequently. It will resolve on its own.  There are no associated sx of nausea, diaphoresis or SOB. She does get SOB with ADLs but is  very deconditioned.  She denies any PND, orthopnea, dizziness, palpitations or syncope. She has chronic edema in the right ankle due to injuries and recently injured it again.  She is compliant with her meds and is tolerating meds with no SE.      She has not been using her CPAP device.  At last OV she had lost weight and felt she did not have OSA .  Her daughter says that she has significant dementia and has problems tolerating keeping a mask on so we decided not to pursue CPAP treatment or retesting.   Past Medical History:  Diagnosis Date   Anemia    s/p Heme work -up normal EGD and colonscopy in 2012 per pt,neg SPEP   Anxiety    Aortic stenosis    Mild by echo 12/2021 with mean aortic valve gradient 17 mmHg   Bilateral carotid artery stenosis 06/08/2015   1-39% bilateral   Bradycardia    CKD (chronic kidney disease), stage III (HCC)    Diabetes mellitus without complication (HCC)    type 2   GERD (gastroesophageal reflux disease)    Gout    HSV-1 (herpes simplex virus 1) infection    Acyclovir prn   Hyperkalemia    Hyperlipidemia    Hypertension    Joint pain    osteoarthritis by Xray- possible erosion of R 4th MCP,elevated uric  acid ,  ANA +    MCI (mild cognitive impairment)    Morbid obesity (HCC)    OSA on CPAP    Osteopenia    PAF (paroxysmal atrial fibrillation) (HCC)    Pain management    Neurosurg: Dr Odette Fraction   Pulmonary HTN (HCC)    Moderate by echo  2016. PASP   TIA (transient ischemic attack)    remote history of TIA's early 2000   Past Surgical History:  Procedure Laterality Date   ABDOMINAL HYSTERECTOMY     APPENDECTOMY     BACK SURGERY     cervical fusion   BLADDER SURGERY     CESAREAN SECTION     CHOLECYSTECTOMY     LOWER EXTREMITY ANGIOGRAPHY N/A 02/25/2023   Procedure: Lower Extremity Angiography;  Surgeon: Leonie Douglas, MD;  [facility] INVAS[facility] CV LAB;  Service: Cardiovascular;  Laterality: N/A;   PERIPHERAL VASCULAR INTERVENTION   02/25/2023   Procedure: PERIPHERAL VASCULAR INTERVENTION;  Surgeon: Leonie Douglas, MD;  Location: [facility] INVASIVE CV LAB;  Service: Cardiovascular;;     Current Meds  Medication Sig   ALPRAZolam (XANAX) 0.25 MG tablet Take 1 tablet (0.25 mg total) by mouth at bedtime as needed for anxiety.   apixaban (ELIQUIS) 5 MG TABS tablet Take 1 tablet (5 mg total) by mouth 2 (two) times daily.   atorvastatin (LIPITOR) 40 MG tablet TAKE ONE TABLET BY MOUTH EVERY EVENING   donepezil (ARICEPT) 5 MG tablet Take 5 mg by mouth at bedtime.   empagliflozin (JARDIANCE) 10 MG TABS tablet Take 10 mg by mouth daily.   ezetimibe (ZETIA) 10 MG tablet Take 10 mg by mouth every evening.   gabapentin (NEURONTIN) 300 MG capsule Take 600 mg by mouth 2 (two) times daily.   insulin lispro (HUMALOG) 100 UNIT/ML injection Inject 1-7 Units into the skin 3 (three) times daily before meals. Sliding scale 141-180=1 unit,181-220= 2 units,221-260= 3 units,261-300=4 units, 301-340=5 units, 341-380=6 units, 381-400 = 7 units,> 400 call MD   Insulin NPH, Human,, Isophane, (NOVOLIN N FLEXPEN) 100 UNIT/ML Kiwkpen Inject 15 units at morning and 18 units at night (Patient taking differently: Inject 12 units at morning and 14 units at night)   levothyroxine (SYNTHROID) 150 MCG tablet Take 150 mcg by mouth every morning.   metoprolol tartrate (LOPRESSOR) 50 MG tablet Take 1 tablet (50 mg total) by mouth 2 (two) times daily. (Patient taking differently: Take 25 mg by mouth 2 (two) times daily.)   olmesartan (BENICAR) 20 MG tablet Take 20 mg by mouth every evening.   OZEMPIC, 0.25 OR 0.5 MG/DOSE, 2 MG/3ML SOPN Inject 0.25 mg into the skin once a week. sundays   sertraline (ZOLOFT) 50 MG tablet Take 50 mg by mouth at bedtime.     Allergies:   Pneumococcal vaccine, Pneumovax 23 [pneumococcal vac polyvalent], Other, Sulfa antibiotics, and Pregabalin   Social History   Tobacco Use   Smoking status: Never   Smokeless tobacco: Never  Vaping Use    Vaping status: Never Used  Substance Use Topics   Alcohol use: No   Drug use: No     Family Hx: The patient's family history includes Arrhythmia in her brother.  ROS:   Please see the history of present illness.     All other systems reviewed and are negative.   Prior CV studies:   The following studies were reviewed today:    Labs/Other Tests and Data Reviewed:    EKG:  No ECG reviewed.  Recent Labs: 01/03/2023: Magnesium 1.7 05/06/2023: ALT 16; BUN 25; Creatinine, Ser 1.30; Hemoglobin 12.6; Platelets 169; Potassium 4.7; Sodium 139   Recent Lipid Panel Lab Results  Component Value Date/Time   CHOL 147 12/18/2021 09:49 AM   TRIG 198 (H) 12/18/2021 09:49 AM   HDL 27 (L) 12/18/2021 09:49 AM   CHOLHDL 5.4 (H) 12/18/2021 09:49 AM   CHOLHDL 5 08/06/2020 03:24 PM   LDLCALC 86 12/18/2021 09:49 AM   LDLDIRECT 81.0 08/06/2020 03:24 PM    Wt Readings from Last 3 Encounters:  07/04/23 189 lb (85.7 kg)  05/08/23 199 lb 15.3 oz (90.7 kg)  03/29/23 200 lb (90.7 kg)        Objective:    Vital Signs:  Ht 5' (1.524 m)   Wt 189 lb (85.7 kg)   BMI 36.91 kg/m    VITAL SIGNS:  reviewed GEN:  no acute distress EYES:  sclerae anicteric, EOMI - Extraocular Movements Intact RESPIRATORY:  normal respiratory effort, symmetric expansion CARDIOVASCULAR:  no peripheral edema SKIN:  no rash, lesions or ulcers. MUSCULOSKELETAL:  no obvious deformities. NEURO:  alert and oriented x 3, no obvious focal deficit PSYCH:  normal affect  ASSESSMENT & PLAN:    1.  Persistent atrial fibrillation/atrial flutter -she failed Multaq.   -She was evaluated by Dr. Raul Del and was doing better and wanted to hold off on afib ablation --she has not had any further palpitations since I saw her last and no bleeding issues on DOAC -continue prescription drug management with Eliquis 5mg  BID and Lopressor 25mg  BID with PRN refills -I have personally reviewed and interpreted outside labs performed by  patient's PCP which showed SCr 1.3 and K+ 4.7 on 05/06/23   2.  Hypertension  -BP controlled on exam today -continue prescription drug management with hydrochlorothiazide 25mg  daily, Olmesartan 20mg  daily and Lopressor 25mg  BID with PRN refills -her PCP stopped her amlodipine and started her on Olmesartan -her daughter says that she has had some body aches and was concerned that it may be the statin or ARB   3.  Bilateral carotid artery stenosis  -her last carotid Dopplers in 2018 showed mild carotid stenosis 1 to 39% but repeat dopplers 01/2019 essentially normal -continue statin   4.  Mild aortic stenosis/Mild AR  -2D echo 2020 showed showed a mean aortic valve gradient -2D echo 01/05/2022 showed EF 55 to 60% with mild MR and mild AR with mild aortic stenosis and mean aortic valve gradient 17 mmHg -she was supposed to have a repeat echo in May but this did not occur so will get it scheduled   5.  Hyperlipidemia  -LDL goal is less than 100 -I have personally reviewed and interpreted outside labs performed by patient's PCP which showed LDL 30 and HDL 26 on 03/11/2023 -she has been having leg cramps so I have instructed her daughter to hold the atorvastatin for 2 weeks and call to let us know if she feels better   6.  OSA -at last OV she had stopped using her CPAP device because she was not keeping her mask on due to dementia and she also thought that she lost enough weight not to need ti -we ordered a PSG but did not complete it -her daughter feels like she will not tolerate CPAP give her dementia and so we have decided not to pursue any further treatment.      Time:   Today, I have spent 20 minutes with the patient with  telehealth technology discussing the above problems.     Medication Adjustments/Labs and Tests Ordered: Current medicines are reviewed at length with the patient today.  Concerns regarding medicines are outlined above.   Tests Ordered: No orders of the defined  types were placed in this encounter.   Medication Changes: No orders of the defined types were placed in this encounter.   Follow Up:  In Person in 6 month(s)  Signed, Armanda Magic, MD  07/04/2023 3:32 PM    [facility]

## 2023-07-07 DIAGNOSIS — R309 Painful micturition, unspecified: Secondary | ICD-10-CM | POA: Diagnosis not present

## 2023-07-07 DIAGNOSIS — N390 Urinary tract infection, site not specified: Secondary | ICD-10-CM | POA: Diagnosis not present

## 2023-07-13 DIAGNOSIS — N183 Chronic kidney disease, stage 3 unspecified: Secondary | ICD-10-CM | POA: Diagnosis not present

## 2023-07-13 DIAGNOSIS — E1122 Type 2 diabetes mellitus with diabetic chronic kidney disease: Secondary | ICD-10-CM | POA: Diagnosis not present

## 2023-07-13 DIAGNOSIS — E039 Hypothyroidism, unspecified: Secondary | ICD-10-CM | POA: Diagnosis not present

## 2023-07-13 DIAGNOSIS — R296 Repeated falls: Secondary | ICD-10-CM | POA: Diagnosis not present

## 2023-07-13 DIAGNOSIS — E1143 Type 2 diabetes mellitus with diabetic autonomic (poly)neuropathy: Secondary | ICD-10-CM | POA: Diagnosis not present

## 2023-07-13 DIAGNOSIS — M6389 Disorders of muscle in diseases classified elsewhere, multiple sites: Secondary | ICD-10-CM | POA: Diagnosis not present

## 2023-07-13 DIAGNOSIS — I509 Heart failure, unspecified: Secondary | ICD-10-CM | POA: Diagnosis not present

## 2023-07-13 DIAGNOSIS — G894 Chronic pain syndrome: Secondary | ICD-10-CM | POA: Diagnosis not present

## 2023-07-13 DIAGNOSIS — M25571 Pain in right ankle and joints of right foot: Secondary | ICD-10-CM | POA: Diagnosis not present

## 2023-07-13 DIAGNOSIS — L89611 Pressure ulcer of right heel, stage 1: Secondary | ICD-10-CM | POA: Diagnosis not present

## 2023-07-13 DIAGNOSIS — I130 Hypertensive heart and chronic kidney disease with heart failure and stage 1 through stage 4 chronic kidney disease, or unspecified chronic kidney disease: Secondary | ICD-10-CM | POA: Diagnosis not present

## 2023-07-13 DIAGNOSIS — D631 Anemia in chronic kidney disease: Secondary | ICD-10-CM | POA: Diagnosis not present

## 2023-07-19 DIAGNOSIS — M79642 Pain in left hand: Secondary | ICD-10-CM | POA: Insufficient documentation

## 2023-07-19 DIAGNOSIS — M25532 Pain in left wrist: Secondary | ICD-10-CM | POA: Diagnosis not present

## 2023-07-21 DIAGNOSIS — I509 Heart failure, unspecified: Secondary | ICD-10-CM | POA: Diagnosis not present

## 2023-07-21 DIAGNOSIS — G894 Chronic pain syndrome: Secondary | ICD-10-CM | POA: Diagnosis not present

## 2023-07-21 DIAGNOSIS — F0390 Unspecified dementia without behavioral disturbance: Secondary | ICD-10-CM | POA: Diagnosis not present

## 2023-07-21 DIAGNOSIS — R296 Repeated falls: Secondary | ICD-10-CM | POA: Diagnosis not present

## 2023-07-22 ENCOUNTER — Telehealth: Payer: Self-pay

## 2023-07-22 NOTE — Telephone Encounter (Signed)
-----   Message from Nurse Alcario Drought E sent at 07/04/2023  3:41 PM EST ----- Regarding: restart statin? Patient should have completed 2 week statin vacation

## 2023-07-22 NOTE — Telephone Encounter (Signed)
Followed up with patient's dtr [name] (DPR) regarding statin  vacation of 2 weeks after patient reported leg cramps during video visit 07/04/23. [name] reports leg cramps have improved since patient has been off atorvastatin. Forwarded patient responses to Dr. Mayford Knife.

## 2023-07-25 DIAGNOSIS — N1832 Chronic kidney disease, stage 3b: Secondary | ICD-10-CM | POA: Diagnosis not present

## 2023-07-25 DIAGNOSIS — I129 Hypertensive chronic kidney disease with stage 1 through stage 4 chronic kidney disease, or unspecified chronic kidney disease: Secondary | ICD-10-CM | POA: Diagnosis not present

## 2023-07-25 DIAGNOSIS — N189 Chronic kidney disease, unspecified: Secondary | ICD-10-CM | POA: Diagnosis not present

## 2023-07-25 DIAGNOSIS — D631 Anemia in chronic kidney disease: Secondary | ICD-10-CM | POA: Diagnosis not present

## 2023-07-25 DIAGNOSIS — N2581 Secondary hyperparathyroidism of renal origin: Secondary | ICD-10-CM | POA: Diagnosis not present

## 2023-08-01 DIAGNOSIS — E1122 Type 2 diabetes mellitus with diabetic chronic kidney disease: Secondary | ICD-10-CM | POA: Diagnosis not present

## 2023-08-01 DIAGNOSIS — F411 Generalized anxiety disorder: Secondary | ICD-10-CM | POA: Diagnosis not present

## 2023-08-01 DIAGNOSIS — F0284 Dementia in other diseases classified elsewhere, unspecified severity, with anxiety: Secondary | ICD-10-CM | POA: Diagnosis not present

## 2023-08-01 DIAGNOSIS — E1143 Type 2 diabetes mellitus with diabetic autonomic (poly)neuropathy: Secondary | ICD-10-CM | POA: Diagnosis not present

## 2023-08-01 DIAGNOSIS — E039 Hypothyroidism, unspecified: Secondary | ICD-10-CM | POA: Diagnosis not present

## 2023-08-01 DIAGNOSIS — D631 Anemia in chronic kidney disease: Secondary | ICD-10-CM | POA: Diagnosis not present

## 2023-08-01 DIAGNOSIS — N183 Chronic kidney disease, stage 3 unspecified: Secondary | ICD-10-CM | POA: Diagnosis not present

## 2023-08-01 DIAGNOSIS — G894 Chronic pain syndrome: Secondary | ICD-10-CM | POA: Diagnosis not present

## 2023-08-01 DIAGNOSIS — I130 Hypertensive heart and chronic kidney disease with heart failure and stage 1 through stage 4 chronic kidney disease, or unspecified chronic kidney disease: Secondary | ICD-10-CM | POA: Diagnosis not present

## 2023-08-01 DIAGNOSIS — I4891 Unspecified atrial fibrillation: Secondary | ICD-10-CM | POA: Diagnosis not present

## 2023-08-01 DIAGNOSIS — I509 Heart failure, unspecified: Secondary | ICD-10-CM | POA: Diagnosis not present

## 2023-08-01 DIAGNOSIS — F0283 Dementia in other diseases classified elsewhere, unspecified severity, with mood disturbance: Secondary | ICD-10-CM | POA: Diagnosis not present

## 2023-08-04 ENCOUNTER — Telehealth: Payer: Self-pay | Admitting: Surgery

## 2023-08-04 DIAGNOSIS — R309 Painful micturition, unspecified: Secondary | ICD-10-CM | POA: Diagnosis not present

## 2023-08-04 NOTE — Telephone Encounter (Signed)
I received a call from the patient's daughter tonight.  The patient has previously undergone stenting of a right SFA lesion by Dr. Lenell Antu.  He last saw her over the summer.  The daughter said that the patient experienced acute onset of left calf pain extending to the foot.  The toes are turning blue.  She does have the ability to wiggle her toes and she can feel her toes.  I told her that this potentially could be a limb threatening situation and that she would need to be evaluated for this.  Because it is after hours, I told her that she needed to come to the emergency department at [facility] for me to evaluate her to see if she needs an acute intervention.

## 2023-08-05 ENCOUNTER — Emergency Department (HOSPITAL_COMMUNITY)
Admission: EM | Admit: 2023-08-05 | Discharge: 2023-08-06 | Disposition: A | Discharge status: Home / Self Care | Payer: PPO | Attending: Emergency Medicine | Admitting: Emergency Medicine

## 2023-08-05 ENCOUNTER — Telehealth: Payer: Self-pay

## 2023-08-05 ENCOUNTER — Emergency Department (HOSPITAL_COMMUNITY): Payer: PPO

## 2023-08-05 ENCOUNTER — Emergency Department (HOSPITAL_BASED_OUTPATIENT_CLINIC_OR_DEPARTMENT_OTHER): Payer: PPO

## 2023-08-05 ENCOUNTER — Other Ambulatory Visit: Payer: Self-pay

## 2023-08-05 ENCOUNTER — Encounter (HOSPITAL_COMMUNITY): Payer: Self-pay | Admitting: Emergency Medicine

## 2023-08-05 DIAGNOSIS — Z794 Long term (current) use of insulin: Secondary | ICD-10-CM | POA: Insufficient documentation

## 2023-08-05 DIAGNOSIS — E039 Hypothyroidism, unspecified: Secondary | ICD-10-CM | POA: Diagnosis not present

## 2023-08-05 DIAGNOSIS — M79662 Pain in left lower leg: Secondary | ICD-10-CM

## 2023-08-05 DIAGNOSIS — E119 Type 2 diabetes mellitus without complications: Secondary | ICD-10-CM | POA: Diagnosis not present

## 2023-08-05 DIAGNOSIS — D72829 Elevated white blood cell count, unspecified: Secondary | ICD-10-CM | POA: Insufficient documentation

## 2023-08-05 DIAGNOSIS — Z7901 Long term (current) use of anticoagulants: Secondary | ICD-10-CM | POA: Diagnosis not present

## 2023-08-05 DIAGNOSIS — Z7984 Long term (current) use of oral hypoglycemic drugs: Secondary | ICD-10-CM | POA: Insufficient documentation

## 2023-08-05 DIAGNOSIS — Z79899 Other long term (current) drug therapy: Secondary | ICD-10-CM | POA: Insufficient documentation

## 2023-08-05 DIAGNOSIS — Z7982 Long term (current) use of aspirin: Secondary | ICD-10-CM | POA: Insufficient documentation

## 2023-08-05 DIAGNOSIS — I10 Essential (primary) hypertension: Secondary | ICD-10-CM | POA: Diagnosis not present

## 2023-08-05 DIAGNOSIS — R932 Abnormal findings on diagnostic imaging of liver and biliary tract: Secondary | ICD-10-CM | POA: Diagnosis not present

## 2023-08-05 DIAGNOSIS — K8689 Other specified diseases of pancreas: Secondary | ICD-10-CM | POA: Diagnosis not present

## 2023-08-05 DIAGNOSIS — M79605 Pain in left leg: Secondary | ICD-10-CM | POA: Diagnosis not present

## 2023-08-05 DIAGNOSIS — I70203 Unspecified atherosclerosis of native arteries of extremities, bilateral legs: Secondary | ICD-10-CM | POA: Diagnosis not present

## 2023-08-05 DIAGNOSIS — K5710 Diverticulosis of small intestine without perforation or abscess without bleeding: Secondary | ICD-10-CM | POA: Diagnosis not present

## 2023-08-05 LAB — CBC WITH DIFFERENTIAL/PLATELET
Abs Immature Granulocytes: 0.04 10*3/uL (ref 0.00–0.07)
Basophils Absolute: 0.1 10*3/uL (ref 0.0–0.1)
Basophils Relative: 1 %
Eosinophils Absolute: 0.1 10*3/uL (ref 0.0–0.5)
Eosinophils Relative: 1 %
HCT: 40.3 % (ref 36.0–46.0)
Hemoglobin: 12.3 g/dL (ref 12.0–15.0)
Immature Granulocytes: 0 %
Lymphocytes Relative: 23 %
Lymphs Abs: 2.7 10*3/uL (ref 0.7–4.0)
MCH: 27.6 pg (ref 26.0–34.0)
MCHC: 30.5 g/dL (ref 30.0–36.0)
MCV: 90.4 fL (ref 80.0–100.0)
Monocytes Absolute: 1.2 10*3/uL — ABNORMAL HIGH (ref 0.1–1.0)
Monocytes Relative: 11 %
Neutro Abs: 7.4 10*3/uL (ref 1.7–7.7)
Neutrophils Relative %: 64 %
Platelets: 236 10*3/uL (ref 150–400)
RBC: 4.46 MIL/uL (ref 3.87–5.11)
RDW: 17.1 % — ABNORMAL HIGH (ref 11.5–15.5)
WBC: 11.5 10*3/uL — ABNORMAL HIGH (ref 4.0–10.5)
nRBC: 0 % (ref 0.0–0.2)

## 2023-08-05 LAB — COMPREHENSIVE METABOLIC PANEL
ALT: 14 U/L (ref 0–44)
AST: 16 U/L (ref 15–41)
Albumin: 2.8 g/dL — ABNORMAL LOW (ref 3.5–5.0)
Alkaline Phosphatase: 108 U/L (ref 38–126)
Anion gap: 13 (ref 5–15)
BUN: 25 mg/dL — ABNORMAL HIGH (ref 8–23)
CO2: 22 mmol/L (ref 22–32)
Calcium: 8.6 mg/dL — ABNORMAL LOW (ref 8.9–10.3)
Chloride: 99 mmol/L (ref 98–111)
Creatinine, Ser: 1.42 mg/dL — ABNORMAL HIGH (ref 0.44–1.00)
GFR, Estimated: 37 mL/min — ABNORMAL LOW (ref >60)
Glucose, Bld: 408 mg/dL — ABNORMAL HIGH (ref 70–99)
Potassium: 4.4 mmol/L (ref 3.5–5.1)
Sodium: 134 mmol/L — ABNORMAL LOW (ref 135–145)
Total Bilirubin: 0.5 mg/dL (ref <1.2)
Total Protein: 6.7 g/dL (ref 6.5–8.1)

## 2023-08-05 LAB — I-STAT CG4 LACTIC ACID, ED: Lactic Acid, Venous: 1.2 mmol/L (ref 0.5–1.9)

## 2023-08-05 MED ORDER — IOHEXOL 350 MG/ML SOLN
75.0000 mL | Freq: Once | INTRAVENOUS | Status: AC | PRN
  Administered 2023-08-05: 75 mL via INTRAVENOUS

## 2023-08-05 MED ORDER — HYDROCODONE-ACETAMINOPHEN 5-325 MG PO TABS: 1.0000 | ORAL_TABLET | Freq: Once | ORAL | Status: DC

## 2023-08-05 NOTE — ED Provider Notes (Signed)
I was notified by Ernestene Mention RVT that Ladean Raya negative for DVT of LLE   Arabella Merles, Cordelia Poche 08/05/23 1751    Durwin Glaze, MD 08/05/23 [phone]

## 2023-08-05 NOTE — ED Triage Notes (Signed)
Pt with reported complex hx of PVD to her legs.  Stent placed in the right in the past.  Pt has had 3 days of increasing pain to left leg with coolness to touch and color change to toes per family.

## 2023-08-05 NOTE — ED Provider Notes (Incomplete)
Elderly female presenting today with worsening pain in her left leg.  Patient has known PAD and a stent in her right leg but left leg was not amenable to stenting due to severity of disease.  Patient's foot is slightly darker in color on the left but there is a dopplerable pulse and capillary refill of 3 seconds.  DVT scan is negative.  CTA does show an area of occlusion and narrowing.

## 2023-08-05 NOTE — Telephone Encounter (Signed)
Pt's daughter, [name], called stating that the pt has severe LLE pain and that her toes are turning more purple.  Reviewed pt's chart, returned call for clarification, two identifiers used. Reviewed the phone discussion that she had with Dr. Myra Gianotti on 12/5 when she called the after hours line. Informed her that she needed to follow his directions on having the pt seen d/t the possible severity of the situation. Instructed her to take the pt to [facility] ED asap to be evaluated and one of our vascular surgeons would be consulted at that time. Confirmed understanding.

## 2023-08-05 NOTE — Progress Notes (Signed)
Attempted to get patient for vascular ultrasound, attempting to draw blood at this time.  Will attempt as patient availability permits.   Ernestene Mention, RVT/RDMS

## 2023-08-05 NOTE — Progress Notes (Signed)
LLE venous duplex has been completed.  Preliminary results was given to Arabella Merles, PA-C.   Results can be found under chart review under CV PROC. 08/05/2023 5:46 PM Genecis Veley RVT, RDMS

## 2023-08-05 NOTE — ED Provider Triage Note (Signed)
Emergency Medicine Provider Triage Evaluation Note  Tina Hester , a 82 y.o. female  was evaluated in triage.  Pt complains of pain in the left leg and foot for the past couple of days. Reports history of arterial disease in the leg. Denies any shortness of breath, chest pain. Pt is on Eliquis  Review of Systems  Positive: As above Negative: As above  Physical Exam  There were no vitals taken for this visit. Gen:   Awake, no distress   Resp:  Normal effort  MSK:   Moves extremities without difficulty  Other:  L foot toes with good cap refill. Skin of LLE with good color in calf, more red on the foot  Medical Decision Making  Medically screening exam initiated at 4:08 PM.  Appropriate orders placed.  Tina Hester was informed that the remainder of the evaluation will be completed by another provider, this initial triage assessment does not replace that evaluation, and the importance of remaining in the ED until their evaluation is complete.     Arabella Merles, PA-C 08/05/23 1610

## 2023-08-05 NOTE — ED Provider Notes (Signed)
[place] EMERGENCY DEPARTMENT AT [facility] Provider Note   CSN: [id number] Arrival date & time: 08/05/23  1531     History {Add pertinent medical, surgical, social history, OB history to HPI:1} Chief Complaint  Patient presents with   Leg Pain    JUANELLE DESAI is a 82 y.o. female.  82 year old female with a past medical history of T2DM, HLD, A-fib on Eliquis, hypothyroidism, HTN, PVD presents here for several days of progressively worsening left foot and distal left leg pain.  Patient follows with vascular surgery.  She has previously undergone stenting of her right lower extremity.  She has known peripheral vascular disease in her left lower extremity.  However, they were just monitoring.  Per her daughter, who provides most of the history, patient was not a candidate for stenting of her left lower extremity.  The patient's daughter states that the patient has been complaining of new pain in her left foot and distal left leg for the past several days.  Pain seems to be worst in the area posterior to her left knee.  She endorses some numbness in her second toe that is new.  Daughter is also noticed that her toes seem to be more purple in color over the last several days as well.  Patient's foot also has felt cooler to the patient's daughter.  Patient is on Eliquis, which she has been compliant with.  The history is provided by the patient and a relative.       Home Medications Prior to Admission medications   Medication Sig Start Date End Date Taking? Authorizing Provider  acetaminophen (TYLENOL) 325 MG tablet Take 500 mg by mouth every 4 (four) hours as needed for mild pain or moderate pain.    [provider]  ALPRAZolam Prudy Feeler) 0.25 MG tablet Take 1 tablet (0.25 mg total) by mouth at bedtime as needed for anxiety. 01/03/23   Sherryll Burger, Pratik D, DO  apixaban (ELIQUIS) 5 MG TABS tablet Take 1 tablet (5 mg total) by mouth 2 (two) times daily. 07/01/23   Quintella Reichert, MD  aspirin EC 81 MG tablet Take 1 tablet (81 mg total) by mouth daily. Swallow whole. Patient not taking: Reported on 07/04/2023 02/25/23 02/25/24  Leonie Douglas, MD  atorvastatin (LIPITOR) 40 MG tablet TAKE ONE TABLET BY MOUTH EVERY EVENING 01/29/22   Quintella Reichert, MD  donepezil (ARICEPT) 5 MG tablet Take 5 mg by mouth at bedtime. 12/17/22   [provider]  empagliflozin (JARDIANCE) 10 MG TABS tablet Take 10 mg by mouth daily.    [provider]  ezetimibe (ZETIA) 10 MG tablet Take 10 mg by mouth every evening. 12/17/22   [provider]  gabapentin (NEURONTIN) 300 MG capsule Take 600 mg by mouth 2 (two) times daily.    [provider]  HYDROcodone-acetaminophen (NORCO/VICODIN) 5-325 MG tablet Take 1 tablet by mouth every 4 (four) hours as needed for moderate pain. Patient not taking: Reported on 07/04/2023    [provider]  insulin lispro (HUMALOG) 100 UNIT/ML injection Inject 1-7 Units into the skin 3 (three) times daily before meals. Sliding scale 141-180=1 unit,181-220= 2 units,221-260= 3 units,261-300=4 units, 301-340=5 units, 341-380=6 units, 381-400 = 7 units,> 400 call MD    [provider]  Insulin NPH, Human,, Isophane, (NOVOLIN N FLEXPEN) 100 UNIT/ML Kiwkpen Inject 15 units at morning and 18 units at night Patient taking differently: Inject 12 units at morning and 14 units at night 09/24/21  Reather Littler, MD  levothyroxine (SYNTHROID) 150 MCG tablet Take 150 mcg by mouth every morning. 04/13/23   [provider]  metoprolol tartrate (LOPRESSOR) 50 MG tablet Take 1 tablet (50 mg total) by mouth 2 (two) times daily. Patient taking differently: Take 25 mg by mouth 2 (two) times daily. 05/07/22   Quintella Reichert, MD  olmesartan (BENICAR) 20 MG tablet Take 20 mg by mouth every evening. 12/17/22   [provider]  omeprazole (PRILOSEC) 20 MG capsule Take 20 mg by mouth every evening. Patient not taking: Reported on  07/04/2023 04/13/23   [provider]  OZEMPIC, 0.25 OR 0.5 MG/DOSE, 2 MG/3ML SOPN Inject 0.25 mg into the skin once a week. sundays 03/14/23   [provider]  pioglitazone (ACTOS) 15 MG tablet Take 1 tablet (15 mg total) by mouth every morning. Patient not taking: Reported on 07/04/2023 07/28/22   Reather Littler, MD  sertraline (ZOLOFT) 50 MG tablet Take 50 mg by mouth at bedtime. 12/17/22   [provider]      Allergies    Pneumococcal vaccine, Pneumovax 23 [pneumococcal vac polyvalent], Other, Sulfa antibiotics, and Pregabalin    Review of Systems   As noted in HPI  Physical Exam Updated Vital Signs BP 103/69 (BP Location: Right Arm)   Pulse 70   Temp 98.2 F (36.8 C)   Resp 17   SpO2 99%  Physical Exam Vitals reviewed.  Constitutional:      General: She is not in acute distress.    Appearance: She is obese. She is not ill-appearing, toxic-appearing or diaphoretic.  Cardiovascular:     Rate and Rhythm: Normal rate. Rhythm irregular.     Heart sounds: Normal heart sounds. No murmur heard.    No friction rub. No gallop.  Pulmonary:     Effort: Pulmonary effort is normal. No respiratory distress.     Breath sounds: Normal breath sounds. No wheezing, rhonchi or rales.  Skin:    General: Skin is warm and dry.     Comments: Violaceous appearing skin in the webbing of the toes between the first and second, second and third, third and fourth toes of the left foot.  Neurological:     Mental Status: She is alert.     ED Results / Procedures / Treatments   Labs (all labs ordered are listed, but only abnormal results are displayed) Labs Reviewed  CBC WITH DIFFERENTIAL/PLATELET - Abnormal; Notable for the following components:      Result Value   WBC 11.5 (*)    RDW 17.1 (*)    Monocytes Absolute 1.2 (*)    All other components within normal limits  COMPREHENSIVE METABOLIC PANEL - Abnormal; Notable for the following components:   Sodium 134 (*)     Glucose, Bld 408 (*)    BUN 25 (*)    Creatinine, Ser 1.42 (*)    Calcium 8.6 (*)    Albumin 2.8 (*)    GFR, Estimated 37 (*)    All other components within normal limits  I-STAT CG4 LACTIC ACID, ED    EKG None  Radiology VAS [place] LOWER EXTREMITY VENOUS (DVT) (7a-7p)  Result Date: 08/05/2023  Lower Venous DVT Study Patient Name:  BONNITA GLATTER  Date of Exam:   08/05/2023 Medical Rec #: [id number]           Accession #:    [id number] Date of Birth: 10/19/1940           Patient  Gender: F Patient Age:   55 years Exam Location:  [facility] Procedure:      VAS [place] LOWER EXTREMITY VENOUS (DVT) Referring Phys: Arabella Merles --------------------------------------------------------------------------------  Indications: Pain.  Anticoagulation: Eliquis. Limitations: Poor ultrasound/tissue interface. Comparison Study: Previous exam on 12/31/2022 was negative for DVT Performing Technologist: Ernestene Mention RVT, RDMS  Examination Guidelines: A complete evaluation includes B-mode imaging, spectral Doppler, color Doppler, and power Doppler as needed of all accessible portions of each vessel. Bilateral testing is considered an integral part of a complete examination. Limited examinations for reoccurring indications may be performed as noted. The reflux portion of the exam is performed with the patient in reverse Trendelenburg.  +-----+---------------+---------+-----------+----------+--------------+ RIGHTCompressibilityPhasicitySpontaneityPropertiesThrombus Aging +-----+---------------+---------+-----------+----------+--------------+ CFV  Full           Yes      Yes                                 +-----+---------------+---------+-----------+----------+--------------+   +---------+---------------+---------+-----------+----------+-------------------+ LEFT     CompressibilityPhasicitySpontaneityPropertiesThrombus Aging       +---------+---------------+---------+-----------+----------+-------------------+ CFV      Full           Yes      Yes                                      +---------+---------------+---------+-----------+----------+-------------------+ SFJ      Full                                                             +---------+---------------+---------+-----------+----------+-------------------+ FV Prox  Full           Yes      Yes                                      +---------+---------------+---------+-----------+----------+-------------------+ FV Mid   Full           Yes      Yes                                      +---------+---------------+---------+-----------+----------+-------------------+ FV DistalFull           Yes      Yes                  Not well visualized +---------+---------------+---------+-----------+----------+-------------------+ PFV      Full                                                             +---------+---------------+---------+-----------+----------+-------------------+ POP      Full           Yes      Yes                                      +---------+---------------+---------+-----------+----------+-------------------+  PTV      Full                                                             +---------+---------------+---------+-----------+----------+-------------------+ PERO     Full                                         Not well visualized +---------+---------------+---------+-----------+----------+-------------------+    Summary: RIGHT: - No evidence of common femoral vein obstruction.   LEFT: - There is no evidence of deep vein thrombosis in the lower extremity.  - No cystic structure found in the popliteal fossa.  *See table(s) above for measurements and observations.    Preliminary     Procedures Procedures  {Document cardiac monitor, telemetry assessment procedure when appropriate:1}  Medications Ordered in  ED Medications  HYDROcodone-acetaminophen (NORCO/VICODIN) 5-325 MG per tablet 1 tablet (0 tablets Oral Hold 08/05/23 2131)    ED Course/ Medical Decision Making/ A&P Clinical Course as of 08/05/23 2200  Fri Aug 05, 2023  2159 Creatinine(!): 1.42 Slightly increased from baseline of 1.3 [JR]    Clinical Course User Index [JR] Rolla Flatten, MD   {   Click here for ABCD2, HEART and other calculatorsREFRESH Note before signing :1}                              Medical Decision Making Amount and/or Complexity of Data Reviewed Labs:  Decision-making details documented in ED Course.  Risk Prescription drug management.   ***  {Document critical care time when appropriate:1} {Document review of labs and clinical decision tools ie heart score, Chads2Vasc2 etc:1}  {Document your independent review of radiology images, and any outside records:1} {Document your discussion with family members, caretakers, and with consultants:1} {Document social determinants of health affecting pt's care:1} {Document your decision making why or why not admission, treatments were needed:1} Final Clinical Impression(s) / ED Diagnoses Final diagnoses:  None    Rx / DC Orders ED Discharge Orders     None

## 2023-08-06 MED ORDER — HYDROCODONE-ACETAMINOPHEN 5-325 MG PO TABS: 1.0000 | ORAL_TABLET | Freq: Four times a day (QID) | ORAL | 0 refills | Status: DC | PRN

## 2023-08-06 NOTE — Discharge Instructions (Addendum)
Please return to the emergency department if you have continued worsening of your leg pain, your left leg or foot feels cold, your left leg or foot begins to turn a purplish color.  A prescription for pain medicine was sent to your pharmacy.  You may take 1 tablet as needed for breakthrough pain.  This medicine does contain Tylenol.  Please ensure that you are not taking more than 4000 mg of Tylenol in a 24-hour period.

## 2023-08-12 DIAGNOSIS — E1121 Type 2 diabetes mellitus with diabetic nephropathy: Secondary | ICD-10-CM | POA: Diagnosis not present

## 2023-08-12 DIAGNOSIS — I350 Nonrheumatic aortic (valve) stenosis: Secondary | ICD-10-CM | POA: Diagnosis not present

## 2023-08-12 DIAGNOSIS — N390 Urinary tract infection, site not specified: Secondary | ICD-10-CM | POA: Diagnosis not present

## 2023-08-12 DIAGNOSIS — Z993 Dependence on wheelchair: Secondary | ICD-10-CM | POA: Diagnosis not present

## 2023-08-12 DIAGNOSIS — B0229 Other postherpetic nervous system involvement: Secondary | ICD-10-CM | POA: Diagnosis not present

## 2023-08-20 DIAGNOSIS — I509 Heart failure, unspecified: Secondary | ICD-10-CM | POA: Diagnosis not present

## 2023-08-20 DIAGNOSIS — F0390 Unspecified dementia without behavioral disturbance: Secondary | ICD-10-CM | POA: Diagnosis not present

## 2023-08-20 DIAGNOSIS — G894 Chronic pain syndrome: Secondary | ICD-10-CM | POA: Diagnosis not present

## 2023-08-20 DIAGNOSIS — R296 Repeated falls: Secondary | ICD-10-CM | POA: Diagnosis not present

## 2023-08-29 NOTE — Progress Notes (Signed)
 [facility] [place]  ASSESSMENT / PLAN: Tina Hester is a 82 y.o. female with atherosclerosis of native arteries of right lower extremity causing heel ulceration status post right femoropopliteal angioplasty and stenting 02/25/23.  Recommend the following which can slow the progression of atherosclerosis and reduce the risk of major adverse cardiac / limb events:  Complete cessation from all tobacco products. Blood glucose control with goal A1c < 7%. Blood pressure control with goal blood pressure < 140/90 mmHg. Lipid reduction therapy with goal LDL-C <100 mg/dL (<29 if symptomatic from PAD).  Aspirin  81mg  PO QD.  Clopidogrel  75mg  PO every day. Atorvastatin  40-80mg  PO QD (or other high intensity statin therapy).  New ulcer about her left lateral malleolus.  She has significant peripheral arterial disease on the side.  Thankfully the ulcer is small, and could heal without intervention.  Will see her again in a month to monitor the wound.  CHIEF COMPLAINT: right heel ulcer  HISTORY OF PRESENT ILLNESS: Tina Hester is a 82 y.o. female who presents to clinic for evaluation of right heel unstageable ulceration.  This has been present for several months.  The patient has neuropathy and no feeling in her feet.  This happened during an episode of lower extremity edema.  Her sister applied an elevating slipper which wore an ulcer on her right heel.  This is not healed since.  She does not walk enough to claudicate, but does transfer plan is able to do some activities of daily living.  03/29/23: Doing very well.  Her heel ulcer appears to be healing.  Her daughter is doing a good job taking care of her.  She is trying to get her walking is much as possible.  08/30/23: patient returns for surveillance.  She has a very small superficial ulcer over her left lateral malleolus.  She is with her granddaughter today.  The right leg is not bothersome to her at all.  Past  Medical History:  Diagnosis Date   Anemia    s/p Heme work -up normal EGD and colonscopy in 2012 per pt,neg SPEP   Anxiety    Aortic stenosis    Mild by echo 12/2021 with mean aortic valve gradient 17 mmHg   Bilateral carotid artery stenosis 06/08/2015   1-39% bilateral   Bradycardia    CKD (chronic kidney disease), stage III (HCC)    Diabetes mellitus without complication (HCC)    type 2   GERD (gastroesophageal reflux disease)    Gout    HSV-1 (herpes simplex virus 1) infection    Acyclovir  prn   Hyperkalemia    Hyperlipidemia    Hypertension    Joint pain    osteoarthritis by Xray- possible erosion of R 4th MCP,elevated uric  acid ,ANA +    MCI (mild cognitive impairment)    Morbid obesity (HCC)    OSA on CPAP    Osteopenia    PAF (paroxysmal atrial fibrillation) (HCC)    Pain management    Neurosurg: Dr Deward Fabian   Pulmonary HTN (HCC)    Moderate by echo  2016. PASP   TIA (transient ischemic attack)    remote history of TIA's early 2000    Past Surgical History:  Procedure Laterality Date   ABDOMINAL HYSTERECTOMY     APPENDECTOMY     BACK SURGERY     cervical fusion   BLADDER SURGERY     CESAREAN SECTION     CHOLECYSTECTOMY  LOWER EXTREMITY ANGIOGRAPHY N/A 02/25/2023   Procedure: Lower Extremity Angiography;  Surgeon: Magda Debby SAILOR, MD;  Location: [facility] INVASIVE CV LAB;  Service: Cardiovascular;  Laterality: N/A;   PERIPHERAL VASCULAR INTERVENTION  02/25/2023   Procedure: PERIPHERAL VASCULAR INTERVENTION;  Surgeon: Magda Debby SAILOR, MD;  Location: [facility] INVASIVE CV LAB;  Service: Cardiovascular;;    Family History  Problem Relation Age of Onset   Arrhythmia Brother     Social History   Socioeconomic History   Marital status: Divorced    Spouse name: Not on file   Number of children: 4   Years of education: Not on file   Highest education level: Some college, no degree  Occupational History   Not on file  Tobacco Use   Smoking status: Never    Smokeless tobacco: Never  Vaping Use   Vaping status: Never Used  Substance and Sexual Activity   Alcohol  use: No   Drug use: No   Sexual activity: Not on file  Other Topics Concern   Not on file  Social History Narrative   10/27/21 lives with daughter, Ike   Some soda   Social Drivers of Health   Financial Resource Strain: Not on file  Food Insecurity: No Food Insecurity (12/30/2022)   Hunger Vital Sign    Worried About Running Out of Food in the Last Year: Never true    Ran Out of Food in the Last Year: Never true  Transportation Needs: No Transportation Needs (12/30/2022)   PRAPARE - Administrator, Civil Service (Medical): No    Lack of Transportation (Non-Medical): No  Physical Activity: Not on file  Stress: Not on file  Social Connections: Unknown (06/07/2023)   Received [facility]   Social Network    Social Network: Not on file  Intimate Partner Violence: Not At Risk (06/07/2023)   Received from [facility]   HITS    Over the last 12 months how often did your partner physically hurt you?: Never    Over the last 12 months how often did your partner insult you or talk down to you?: Never    Over the last 12 months how often did your partner threaten you with physical harm?: Never    Over the last 12 months how often did your partner scream or curse at you?: Never    Allergies  Allergen Reactions   Pneumococcal Vaccine Anaphylaxis    weakness   Pneumovax 23 [Pneumococcal Vac Polyvalent] Anaphylaxis    weakness   Other Hives    Other reaction(s): Other (See Comments) Pneumonia  Vaccine- very ill   Sulfa Antibiotics Hives   Pregabalin     Other reaction(s): depression    Current Outpatient Medications  Medication Sig Dispense Refill   acetaminophen  (TYLENOL ) 325 MG tablet Take 500 mg by mouth every 4 (four) hours as needed for mild pain or moderate pain.     ALPRAZolam  (XANAX ) 0.25 MG tablet Take 1 tablet (0.25 mg total) by mouth at bedtime  as needed for anxiety. 10 tablet 0   apixaban  (ELIQUIS ) 5 MG TABS tablet Take 1 tablet (5 mg total) by mouth 2 (two) times daily. 180 tablet 1   aspirin  EC 81 MG tablet Take 1 tablet (81 mg total) by mouth daily. Swallow whole. (Patient not taking: Reported on 07/04/2023) 150 tablet 2   atorvastatin  (LIPITOR) 40 MG tablet TAKE ONE TABLET BY MOUTH EVERY EVENING 90 tablet 3   donepezil  (ARICEPT ) 5 MG tablet  Take 5 mg by mouth at bedtime.     empagliflozin  (JARDIANCE ) 10 MG TABS tablet Take 10 mg by mouth daily.     ezetimibe  (ZETIA ) 10 MG tablet Take 10 mg by mouth every evening.     gabapentin  (NEURONTIN ) 300 MG capsule Take 600 mg by mouth 2 (two) times daily.     HYDROcodone -acetaminophen  (NORCO) 5-325 MG tablet Take 1 tablet by mouth every 6 (six) hours as needed for up to 10 doses for moderate pain (pain score 4-6). 10 tablet 0   insulin  lispro (HUMALOG ) 100 UNIT/ML injection Inject 1-7 Units into the skin 3 (three) times daily before meals. Sliding scale 141-180=1 unit,181-220= 2 units,221-260= 3 units,261-300=4 units, 301-340=5 units, 341-380=6 units, 381-400 = 7 units,> 400 call MD     Insulin  NPH, Human,, Isophane, (NOVOLIN  N FLEXPEN) 100 UNIT/ML Kiwkpen Inject 15 units at morning and 18 units at night (Patient taking differently: Inject 12 units at morning and 14 units at night) 15 mL 2   levothyroxine  (SYNTHROID ) 150 MCG tablet Take 150 mcg by mouth every morning.     metoprolol  tartrate (LOPRESSOR ) 50 MG tablet Take 1 tablet (50 mg total) by mouth 2 (two) times daily. (Patient taking differently: Take 25 mg by mouth 2 (two) times daily.) 180 tablet 3   olmesartan (BENICAR) 20 MG tablet Take 20 mg by mouth every evening.     omeprazole (PRILOSEC) 20 MG capsule Take 20 mg by mouth every evening. (Patient not taking: Reported on 07/04/2023)     OZEMPIC , 0.25 OR 0.5 MG/DOSE, 2 MG/3ML SOPN Inject 0.25 mg into the skin once a week. sundays     pioglitazone  (ACTOS ) 15 MG tablet Take 1 tablet (15 mg  total) by mouth every morning. (Patient not taking: Reported on 07/04/2023) 90 tablet 0   sertraline  (ZOLOFT ) 50 MG tablet Take 50 mg by mouth at bedtime.     No current facility-administered medications for this visit.    PHYSICAL EXAM There were no vitals filed for this visit.   Elderly obese woman in no acute distress.  In a wheelchair Regular rate and rhythm Unlabored breathing No palpable pedal pulses Right heel is healed. Left lateral malleolus with small ischemic ulceration about the size of an eraser head   PERTINENT LABORATORY AND RADIOLOGIC DATA  Most recent CBC    Latest Ref Rng & Units 08/05/2023    4:14 PM 05/06/2023    9:34 AM 05/06/2023    9:16 AM  CBC  WBC 4.0 - 10.5 K/uL 11.5   9.1   Hemoglobin 12.0 - 15.0 g/dL 87.6  87.3  88.2   Hematocrit 36.0 - 46.0 % 40.3  37.0  38.3   Platelets 150 - 400 K/uL 236   169      Most recent CMP    Latest Ref Rng & Units 08/05/2023    4:14 PM 05/06/2023    9:34 AM 05/06/2023    9:16 AM  CMP  Glucose 70 - 99 mg/dL 591  706  702   BUN 8 - 23 mg/dL 25  25  23    Creatinine 0.44 - 1.00 mg/dL 8.57  8.69  8.66   Sodium 135 - 145 mmol/L 134  139  136   Potassium 3.5 - 5.1 mmol/L 4.4  4.7  4.6   Chloride 98 - 111 mmol/L 99  104  103   CO2 22 - 32 mmol/L 22   26   Calcium  8.9 - 10.3 mg/dL 8.6  8.7   Total Protein 6.5 - 8.1 g/dL 6.7   6.8   Total Bilirubin <1.2 mg/dL 0.5   0.5   Alkaline Phos 38 - 126 U/L 108   101   AST 15 - 41 U/L 16   22   ALT 0 - 44 U/L 14   16     Renal function CrCl cannot be calculated (Patient's most recent lab result is older than the maximum 21 days allowed.).  Hemoglobin A1C (no units)  Date Value  08/17/2022 8.7   Hgb A1c MFr Bld (%)  Date Value  05/06/2023 8.0 (H)    LDL Chol Calc (NIH)  Date Value Ref Range Status  12/18/2021 86 0 - 99 mg/dL Final   Direct LDL  Date Value Ref Range Status  08/06/2020 81.0 mg/dL Final    Comment:    Optimal:  <100 mg/dLNear or Above Optimal:  100-129  mg/dLBorderline High:  130-159 mg/dLHigh:  160-189 mg/dLVery High:  >190 mg/dL    Angiogram 3/71/75: reviewed. Severe above knee popliteal stenosis. Above and behind knee popliteal occlusion. Good result from intervention with restoration of inline flow to the foot  +-------+-----------+-----------+------------+------------+  ABI/TBIToday's ABIToday's TBIPrevious ABIPrevious TBI  +-------+-----------+-----------+------------+------------+  Right [place]         0.62       [place]          0.47          +-------+-----------+-----------+------------+------------+  Left  0.26       absent     0.51        0.15          +-------+-----------+-----------+------------+------------+       Debby SAILOR. Magda, MD FACS [facility] Phone Number: [phone] 08/29/2023 2:20 PM   Total time spent on preparing this encounter including chart review, data review, collecting history, examining the patient, coordinating care for this established patient, 30 minutes.  Portions of this report may have been transcribed using voice recognition software.  Every effort has been made to ensure accuracy; however, inadvertent computerized transcription errors may still be present.

## 2023-08-30 ENCOUNTER — Encounter: Payer: Self-pay | Admitting: Vascular Surgery

## 2023-08-30 ENCOUNTER — Ambulatory Visit (HOSPITAL_COMMUNITY)
Admission: RE | Admit: 2023-08-30 | Discharge: 2023-08-30 | Disposition: A | Discharge status: Home / Self Care | Payer: PPO | Source: Ambulatory Visit | Attending: Vascular Surgery | Admitting: Vascular Surgery

## 2023-08-30 ENCOUNTER — Ambulatory Visit (INDEPENDENT_AMBULATORY_CARE_PROVIDER_SITE_OTHER): Payer: PPO | Admitting: Vascular Surgery

## 2023-08-30 VITALS — BP 128/72 | HR 90 | Temp 97.8°F | Resp 20 | Ht 60.0 in

## 2023-08-30 DIAGNOSIS — I70234 Atherosclerosis of native arteries of right leg with ulceration of heel and midfoot: Secondary | ICD-10-CM | POA: Diagnosis not present

## 2023-08-30 DIAGNOSIS — I70243 Atherosclerosis of native arteries of left leg with ulceration of ankle: Secondary | ICD-10-CM

## 2023-08-30 LAB — VAS US ABI WITH/WO TBI: Left ABI: 0.26

## 2023-09-27 ENCOUNTER — Encounter (HOSPITAL_COMMUNITY): Payer: PPO

## 2023-09-27 ENCOUNTER — Ambulatory Visit: Payer: PPO | Admitting: Vascular Surgery

## 2023-10-10 NOTE — Progress Notes (Deleted)
 V[facility] SPECIALISTS OF [place]  ASSESSMENT / PLAN: Tina Hester is a 83 y.o. female with atherosclerosis of native arteries of right lower extremity causing heel ulceration status post right femoropopliteal angioplasty and stenting 02/25/23.  Recommend the following which can slow the progression of atherosclerosis and reduce the risk of major adverse cardiac / limb events:  Complete cessation from all tobacco products. Blood glucose control with goal A1c < 7%. Blood pressure control with goal blood pressure < 140/90 mmHg. Lipid reduction therapy with goal LDL-C <100 mg/dL (<16 if symptomatic from PAD).  Aspirin 81mg  PO QD.  Clopidogrel 75mg  PO every day. Atorvastatin 40-80mg  PO QD (or other "high intensity" statin therapy).  New ulcer about her left lateral malleolus.  She has significant peripheral arterial disease on the side.  Thankfully the ulcer is small, and could heal without intervention.  Will see her again in a month to monitor the wound.  CHIEF COMPLAINT: right heel ulcer  HISTORY OF PRESENT ILLNESS: Tina Hester is a 83 y.o. female who presents to clinic for evaluation of right heel unstageable ulceration.  This has been present for several months.  The patient has neuropathy and no feeling in her feet.  This happened during an episode of lower extremity edema.  Her sister applied an elevating slipper which wore an ulcer on her right heel.  This is not healed since.  She does not walk enough to claudicate, but does transfer plan is able to do some activities of daily living.  03/29/23: Doing very well.  Her heel ulcer appears to be healing.  Her daughter is doing a good job taking care of her.  She is trying to get her walking is much as possible.  08/30/23: patient returns for surveillance.  She has a very small superficial ulcer over her left lateral malleolus.  She is with her granddaughter today.  The right leg is not bothersome to her at all.  Past  Medical History:  Diagnosis Date   Anemia    s/p Heme work -up normal EGD and colonscopy in 2012 per pt,neg SPEP   Anxiety    Aortic stenosis    Mild by echo 12/2021 with mean aortic valve gradient 17 mmHg   Bilateral carotid artery stenosis 06/08/2015   1-39% bilateral   Bradycardia    CKD (chronic kidney disease), stage III (HCC)    Diabetes mellitus without complication (HCC)    type 2   GERD (gastroesophageal reflux disease)    Gout    HSV-1 (herpes simplex virus 1) infection    Acyclovir prn   Hyperkalemia    Hyperlipidemia    Hypertension    Joint pain    osteoarthritis by Xray- possible erosion of R 4th MCP,elevated uric  acid ,ANA +    MCI (mild cognitive impairment)    Morbid obesity (HCC)    OSA on CPAP    Osteopenia    PAF (paroxysmal atrial fibrillation) (HCC)    Pain management    Neurosurg: Dr Odette Fraction   Pulmonary HTN (HCC)    Moderate by echo  2016. PASP   TIA (transient ischemic attack)    remote history of TIA's early 2000    Past Surgical History:  Procedure Laterality Date   ABDOMINAL HYSTERECTOMY     APPENDECTOMY     BACK SURGERY     cervical fusion   BLADDER SURGERY     CESAREAN SECTION     CHOLECYSTECTOMY  LOWER EXTREMITY ANGIOGRAPHY N/A 02/25/2023   Procedure: Lower Extremity Angiography;  Surgeon: Leonie Douglas, MD;  Location: [facility] INVASIVE CV LAB;  Service: Cardiovascular;  Laterality: N/A;   PERIPHERAL VASCULAR INTERVENTION  02/25/2023   Procedure: PERIPHERAL VASCULAR INTERVENTION;  Surgeon: Leonie Douglas, MD;  Location: [facility] INVASIVE CV LAB;  Service: Cardiovascular;;    Family History  Problem Relation Age of Onset   Arrhythmia Brother     Social History   Socioeconomic History   Marital status: Divorced    Spouse name: Not on file   Number of children: 4   Years of education: Not on file   Highest education level: Some college, no degree  Occupational History   Not on file  Tobacco Use   Smoking status: Never    Smokeless tobacco: Never  Vaping Use   Vaping status: Never Used  Substance and Sexual Activity   Alcohol use: No   Drug use: No   Sexual activity: Not on file  Other Topics Concern   Not on file  Social History Narrative   10/27/21 lives with daughter, [name]   Some soda   Social Drivers of Health   Financial Resource Strain: Not on file  Food Insecurity: No Food Insecurity (12/30/2022)   Hunger Vital Sign    Worried About Running Out of Food in the Last Year: Never true    Ran Out of Food in the Last Year: Never true  Transportation Needs: No Transportation Needs (12/30/2022)   PRAPARE - Administrator, Civil Service (Medical): No    Lack of Transportation (Non-Medical): No  Physical Activity: Not on file  Stress: Not on file  Social Connections: Unknown (06/07/2023)   Received from [facility]   Social Network    Social Network: Not on file  Intimate Partner Violence: Not At Risk (06/07/2023)   Received from [facility]   HITS    Over the last 12 months how often did your partner physically hurt you?: Never    Over the last 12 months how often did your partner insult you or talk down to you?: Never    Over the last 12 months how often did your partner threaten you with physical harm?: Never    Over the last 12 months how often did your partner scream or curse at you?: Never    Allergies  Allergen Reactions   Pneumococcal Vaccine Anaphylaxis    weakness   Pneumovax 23 [Pneumococcal Vac Polyvalent] Anaphylaxis    weakness   Other Hives    Other reaction(s): Other (See Comments) Pneumonia  Vaccine- very ill   Sulfa Antibiotics Hives   Pregabalin     Other reaction(s): depression    Current Outpatient Medications  Medication Sig Dispense Refill   acetaminophen (TYLENOL) 325 MG tablet Take 500 mg by mouth every 4 (four) hours as needed for mild pain or moderate pain.     ALPRAZolam (XANAX) 0.25 MG tablet Take 1 tablet (0.25 mg total) by mouth at bedtime  as needed for anxiety. 10 tablet 0   apixaban (ELIQUIS) 5 MG TABS tablet Take 1 tablet (5 mg total) by mouth 2 (two) times daily. 180 tablet 1   aspirin EC 81 MG tablet Take 1 tablet (81 mg total) by mouth daily. Swallow whole. 150 tablet 2   atorvastatin (LIPITOR) 40 MG tablet TAKE ONE TABLET BY MOUTH EVERY EVENING 90 tablet 3   donepezil (ARICEPT) 5 MG tablet Take 5 mg by mouth at  bedtime.     empagliflozin (JARDIANCE) 10 MG TABS tablet Take 10 mg by mouth daily.     ezetimibe (ZETIA) 10 MG tablet Take 10 mg by mouth every evening.     gabapentin (NEURONTIN) 300 MG capsule Take 600 mg by mouth 2 (two) times daily.     HYDROcodone-acetaminophen (NORCO) 5-325 MG tablet Take 1 tablet by mouth every 6 (six) hours as needed for up to 10 doses for moderate pain (pain score 4-6). 10 tablet 0   insulin lispro (HUMALOG) 100 UNIT/ML injection Inject 1-7 Units into the skin 3 (three) times daily before meals. Sliding scale 141-180=1 unit,181-220= 2 units,221-260= 3 units,261-300=4 units, 301-340=5 units, 341-380=6 units, 381-400 = 7 units,> 400 call MD     Insulin NPH, Human,, Isophane, (NOVOLIN N FLEXPEN) 100 UNIT/ML Kiwkpen Inject 15 units at morning and 18 units at night (Patient taking differently: Inject 12 units at morning and 14 units at night) 15 mL 2   levothyroxine (SYNTHROID) 150 MCG tablet Take 150 mcg by mouth every morning.     metoprolol tartrate (LOPRESSOR) 50 MG tablet Take 1 tablet (50 mg total) by mouth 2 (two) times daily. (Patient taking differently: Take 25 mg by mouth 2 (two) times daily.) 180 tablet 3   olmesartan (BENICAR) 20 MG tablet Take 20 mg by mouth every evening.     omeprazole (PRILOSEC) 20 MG capsule Take 20 mg by mouth every evening.     OZEMPIC, 0.25 OR 0.5 MG/DOSE, 2 MG/3ML SOPN Inject 0.25 mg into the skin once a week. sundays     pioglitazone (ACTOS) 15 MG tablet Take 1 tablet (15 mg total) by mouth every morning. 90 tablet 0   sertraline (ZOLOFT) 50 MG tablet Take 50  mg by mouth at bedtime.     No current facility-administered medications for this visit.    PHYSICAL EXAM There were no vitals filed for this visit.   Elderly obese woman in no acute distress.  In a wheelchair Regular rate and rhythm Unlabored breathing No palpable pedal pulses Right heel is healed. Left lateral malleolus with small ischemic ulceration about the size of an eraser head   PERTINENT LABORATORY AND RADIOLOGIC DATA  Most recent CBC    Latest Ref Rng & Units 08/05/2023    4:14 PM 05/06/2023    9:34 AM 05/06/2023    9:16 AM  CBC  WBC 4.0 - 10.5 K/uL 11.5   9.1   Hemoglobin 12.0 - 15.0 g/dL 64.4  03.4  74.2   Hematocrit 36.0 - 46.0 % 40.3  37.0  38.3   Platelets 150 - 400 K/uL 236   169      Most recent CMP    Latest Ref Rng & Units 08/05/2023    4:14 PM 05/06/2023    9:34 AM 05/06/2023    9:16 AM  CMP  Glucose 70 - 99 mg/dL 595  638  756   BUN 8 - 23 mg/dL 25  25  23    Creatinine 0.44 - 1.00 mg/dL 4.33  2.95  1.88   Sodium 135 - 145 mmol/L 134  139  136   Potassium 3.5 - 5.1 mmol/L 4.4  4.7  4.6   Chloride 98 - 111 mmol/L 99  104  103   CO2 22 - 32 mmol/L 22   26   Calcium 8.9 - 10.3 mg/dL 8.6   8.7   Total Protein 6.5 - 8.1 g/dL 6.7   6.8   Total Bilirubin <1.2  mg/dL 0.5   0.5   Alkaline Phos 38 - 126 U/L 108   101   AST 15 - 41 U/L 16   22   ALT 0 - 44 U/L 14   16     Renal function CrCl cannot be calculated (Patient's most recent lab result is older than the maximum 21 days allowed.).  Hemoglobin A1C (no units)  Date Value  08/17/2022 8.7   Hgb A1c MFr Bld (%)  Date Value  05/06/2023 8.0 (H)    LDL Chol Calc (NIH)  Date Value Ref Range Status  12/18/2021 86 0 - 99 mg/dL Final   Direct LDL  Date Value Ref Range Status  08/06/2020 81.0 mg/dL Final    Comment:    Optimal:  <100 mg/dLNear or Above Optimal:  100-129 mg/dLBorderline High:  130-159 mg/dLHigh:  160-189 mg/dLVery High:  >190 mg/dL    Angiogram 04/28/55: reviewed. Severe above knee  popliteal stenosis. Above and behind knee popliteal occlusion. Good result from intervention with restoration of inline flow to the foot  +-------+-----------+-----------+------------+------------+  ABI/TBIToday's ABIToday's TBIPrevious ABIPrevious TBI  +-------+-----------+-----------+------------+------------+  Right [place]         0.62       [place]          0.47          +-------+-----------+-----------+------------+------------+  Left  0.26       absent     0.51        0.15          +-------+-----------+-----------+------------+------------+       Rande Brunt. Lenell Antu, MD [facility] Specialists of [facility] Phone Number: [phone] 10/10/2023 5:03 PM   Total time spent on preparing this encounter including chart review, data review, collecting history, examining the patient, coordinating care for this established patient, 30 minutes.  Portions of this report may have been transcribed using voice recognition software.  Every effort has been made to ensure accuracy; however, inadvertent computerized transcription errors may still be present.

## 2023-10-11 ENCOUNTER — Ambulatory Visit: Payer: PPO | Admitting: Vascular Surgery

## 2023-10-25 ENCOUNTER — Ambulatory Visit: Payer: PPO | Admitting: Podiatry

## 2023-10-25 ENCOUNTER — Encounter: Payer: Self-pay | Admitting: Podiatry

## 2023-10-25 DIAGNOSIS — R0989 Other specified symptoms and signs involving the circulatory and respiratory systems: Secondary | ICD-10-CM

## 2023-10-25 DIAGNOSIS — M79676 Pain in unspecified toe(s): Secondary | ICD-10-CM | POA: Diagnosis not present

## 2023-10-25 DIAGNOSIS — B351 Tinea unguium: Secondary | ICD-10-CM

## 2023-10-25 NOTE — Progress Notes (Signed)
 She presents today chief complaint of painful elongated toenails.  Her daughter states that she has a wound on her left leg and she is seeing wound care and vascular surgery next week.  Objective: Vital signs stable oriented x 3 pulses are nonpalpable bilateral left foot is cooler than that of the right foot capillary fill time is immediate on the proximal calf there is a circumferential wound appears to be necrotic currently not much drainage does not have any odor to it.  Toenails are long thick yellow dystrophic clinically mycotic.  Assessment: Pain limb secondary to onychomycosis peripheral vascular disease.  Plan: Debrided toenails 1 through 5 bilaterally have recommended that she follow-up with vascular and wound care immediately.

## 2023-10-26 DIAGNOSIS — L03116 Cellulitis of left lower limb: Secondary | ICD-10-CM | POA: Diagnosis not present

## 2023-11-06 NOTE — Progress Notes (Unsigned)
 VASCULAR AND VEIN SPECIALISTS OF [place]  ASSESSMENT / PLAN: Tina Hester is a 83 y.o. female with atherosclerosis of native arteries of right lower extremity causing heel ulceration status post right femoropopliteal angioplasty and stenting 02/25/23.  Recommend the following which can slow the progression of atherosclerosis and reduce the risk of major adverse cardiac / limb events:  Complete cessation from all tobacco products. Blood glucose control with goal A1c < 7%. Blood pressure control with goal blood pressure < 140/90 mmHg. Lipid reduction therapy with goal LDL-C <100 mg/dL (<16 if symptomatic from PAD).  Aspirin 81mg  PO QD.  Clopidogrel 75mg  PO every day. Atorvastatin 40-80mg  PO QD (or other "high intensity" statin therapy).  New ulcer about her left lateral malleolus.  She has significant peripheral arterial disease on the side.  Thankfully the ulcer is small, and could heal without intervention.  Will see her again in a month to monitor the wound.  CHIEF COMPLAINT: right heel ulcer  HISTORY OF PRESENT ILLNESS: Tina Hester is a 83 y.o. female who presents to clinic for evaluation of right heel unstageable ulceration.  This has been present for several months.  The patient has neuropathy and no feeling in her feet.  This happened during an episode of lower extremity edema.  Her sister applied an elevating slipper which wore an ulcer on her right heel.  This is not healed since.  She does not walk enough to claudicate, but does transfer plan is able to do some activities of daily living.  03/29/23: Doing very well.  Her heel ulcer appears to be healing.  Her daughter is doing a good job taking care of her.  She is trying to get her walking is much as possible.  08/30/23: patient returns for surveillance.  She has a very small superficial ulcer over her left lateral malleolus.  She is with her granddaughter today.  The right leg is not bothersome to her at all.  Past  Medical History:  Diagnosis Date   Anemia    s/p Heme work -up normal EGD and colonscopy in 2012 per pt,neg SPEP   Anxiety    Aortic stenosis    Mild by echo 12/2021 with mean aortic valve gradient 17 mmHg   Bilateral carotid artery stenosis 06/08/2015   1-39% bilateral   Bradycardia    CKD (chronic kidney disease), stage III (HCC)    Diabetes mellitus without complication (HCC)    type 2   GERD (gastroesophageal reflux disease)    Gout    HSV-1 (herpes simplex virus 1) infection    Acyclovir prn   Hyperkalemia    Hyperlipidemia    Hypertension    Joint pain    osteoarthritis by Xray- possible erosion of R 4th MCP,elevated uric  acid ,ANA +    MCI (mild cognitive impairment)    Morbid obesity (HCC)    OSA on CPAP    Osteopenia    PAF (paroxysmal atrial fibrillation) (HCC)    Pain management    Neurosurg: Dr Odette Fraction   Pulmonary HTN (HCC)    Moderate by echo  2016. PASP   TIA (transient ischemic attack)    remote history of TIA's early 2000    Past Surgical History:  Procedure Laterality Date   ABDOMINAL HYSTERECTOMY     APPENDECTOMY     BACK SURGERY     cervical fusion   BLADDER SURGERY     CESAREAN SECTION     CHOLECYSTECTOMY  LOWER EXTREMITY ANGIOGRAPHY N/A 02/25/2023   Procedure: Lower Extremity Angiography;  Surgeon: Leonie Douglas, MD;  Location: [facility] INVASIVE CV LAB;  Service: Cardiovascular;  Laterality: N/A;   PERIPHERAL VASCULAR INTERVENTION  02/25/2023   Procedure: PERIPHERAL VASCULAR INTERVENTION;  Surgeon: Leonie Douglas, MD;  Location: [facility] INVASIVE CV LAB;  Service: Cardiovascular;;    Family History  Problem Relation Age of Onset   Arrhythmia Brother     Social History   Socioeconomic History   Marital status: Divorced    Spouse name: Not on file   Number of children: 4   Years of education: Not on file   Highest education level: Some college, no degree  Occupational History   Not on file  Tobacco Use   Smoking status: Never    Smokeless tobacco: Never  Vaping Use   Vaping status: Never Used  Substance and Sexual Activity   Alcohol use: No   Drug use: No   Sexual activity: Not on file  Other Topics Concern   Not on file  Social History Narrative   10/27/21 lives with daughter, [name]   Some soda   Social Drivers of Health   Financial Resource Strain: Not on file  Food Insecurity: No Food Insecurity (12/30/2022)   Hunger Vital Sign    Worried About Running Out of Food in the Last Year: Never true    Ran Out of Food in the Last Year: Never true  Transportation Needs: No Transportation Needs (12/30/2022)   PRAPARE - Administrator, Civil Service (Medical): No    Lack of Transportation (Non-Medical): No  Physical Activity: Not on file  Stress: Not on file  Social Connections: Unknown (06/07/2023)   Received from [facility]   Social Network    Social Network: Not on file  Intimate Partner Violence: Not At Risk (06/07/2023)   Received from [facility]   HITS    Over the last 12 months how often did your partner physically hurt you?: Never    Over the last 12 months how often did your partner insult you or talk down to you?: Never    Over the last 12 months how often did your partner threaten you with physical harm?: Never    Over the last 12 months how often did your partner scream or curse at you?: Never    Allergies  Allergen Reactions   Pneumococcal Vaccine Anaphylaxis    weakness   Pneumovax 23 [Pneumococcal Vac Polyvalent] Anaphylaxis    weakness   Other Hives    Other reaction(s): Other (See Comments) Pneumonia  Vaccine- very ill   Sulfa Antibiotics Hives   Pregabalin     Other reaction(s): depression    Current Outpatient Medications  Medication Sig Dispense Refill   acetaminophen (TYLENOL) 325 MG tablet Take 500 mg by mouth every 4 (four) hours as needed for mild pain or moderate pain.     ALPRAZolam (XANAX) 0.25 MG tablet Take 1 tablet (0.25 mg total) by mouth at bedtime  as needed for anxiety. 10 tablet 0   apixaban (ELIQUIS) 5 MG TABS tablet Take 1 tablet (5 mg total) by mouth 2 (two) times daily. 180 tablet 1   aspirin EC 81 MG tablet Take 1 tablet (81 mg total) by mouth daily. Swallow whole. 150 tablet 2   atorvastatin (LIPITOR) 40 MG tablet TAKE ONE TABLET BY MOUTH EVERY EVENING 90 tablet 3   donepezil (ARICEPT) 5 MG tablet Take 5 mg by mouth at  bedtime.     empagliflozin (JARDIANCE) 10 MG TABS tablet Take 10 mg by mouth daily.     ezetimibe (ZETIA) 10 MG tablet Take 10 mg by mouth every evening.     gabapentin (NEURONTIN) 300 MG capsule Take 600 mg by mouth 2 (two) times daily.     HYDROcodone-acetaminophen (NORCO) 5-325 MG tablet Take 1 tablet by mouth every 6 (six) hours as needed for up to 10 doses for moderate pain (pain score 4-6). 10 tablet 0   insulin lispro (HUMALOG) 100 UNIT/ML injection Inject 1-7 Units into the skin 3 (three) times daily before meals. Sliding scale 141-180=1 unit,181-220= 2 units,221-260= 3 units,261-300=4 units, 301-340=5 units, 341-380=6 units, 381-400 = 7 units,> 400 call MD     Insulin NPH, Human,, Isophane, (NOVOLIN N FLEXPEN) 100 UNIT/ML Kiwkpen Inject 15 units at morning and 18 units at night (Patient taking differently: Inject 12 units at morning and 14 units at night) 15 mL 2   levothyroxine (SYNTHROID) 150 MCG tablet Take 150 mcg by mouth every morning.     metoprolol tartrate (LOPRESSOR) 50 MG tablet Take 1 tablet (50 mg total) by mouth 2 (two) times daily. (Patient taking differently: Take 25 mg by mouth 2 (two) times daily.) 180 tablet 3   olmesartan (BENICAR) 20 MG tablet Take 20 mg by mouth every evening.     omeprazole (PRILOSEC) 20 MG capsule Take 20 mg by mouth every evening.     OZEMPIC, 0.25 OR 0.5 MG/DOSE, 2 MG/3ML SOPN Inject 0.25 mg into the skin once a week. sundays     pioglitazone (ACTOS) 15 MG tablet Take 1 tablet (15 mg total) by mouth every morning. 90 tablet 0   sertraline (ZOLOFT) 50 MG tablet Take 50  mg by mouth at bedtime.     No current facility-administered medications for this visit.    PHYSICAL EXAM There were no vitals filed for this visit.   Elderly obese woman in no acute distress.  In a wheelchair Regular rate and rhythm Unlabored breathing No palpable pedal pulses Right heel is healed. Left lateral malleolus with small ischemic ulceration about the size of an eraser head   PERTINENT LABORATORY AND RADIOLOGIC DATA  Most recent CBC    Latest Ref Rng & Units 08/05/2023    4:14 PM 05/06/2023    9:34 AM 05/06/2023    9:16 AM  CBC  WBC 4.0 - 10.5 K/uL 11.5   9.1   Hemoglobin 12.0 - 15.0 g/dL 69.6  29.5  28.4   Hematocrit 36.0 - 46.0 % 40.3  37.0  38.3   Platelets 150 - 400 K/uL 236   169      Most recent CMP    Latest Ref Rng & Units 08/05/2023    4:14 PM 05/06/2023    9:34 AM 05/06/2023    9:16 AM  CMP  Glucose 70 - 99 mg/dL 132  440  102   BUN 8 - 23 mg/dL 25  25  23    Creatinine 0.44 - 1.00 mg/dL 7.25  3.66  4.40   Sodium 135 - 145 mmol/L 134  139  136   Potassium 3.5 - 5.1 mmol/L 4.4  4.7  4.6   Chloride 98 - 111 mmol/L 99  104  103   CO2 22 - 32 mmol/L 22   26   Calcium 8.9 - 10.3 mg/dL 8.6   8.7   Total Protein 6.5 - 8.1 g/dL 6.7   6.8   Total Bilirubin <1.2  mg/dL 0.5   0.5   Alkaline Phos 38 - 126 U/L 108   101   AST 15 - 41 U/L 16   22   ALT 0 - 44 U/L 14   16     Renal function CrCl cannot be calculated (Patient's most recent lab result is older than the maximum 21 days allowed.).  Hemoglobin A1C (no units)  Date Value  08/17/2022 8.7   Hgb A1c MFr Bld (%)  Date Value  05/06/2023 8.0 (H)    LDL Chol Calc (NIH)  Date Value Ref Range Status  12/18/2021 86 0 - 99 mg/dL Final   Direct LDL  Date Value Ref Range Status  08/06/2020 81.0 mg/dL Final    Comment:    Optimal:  <100 mg/dLNear or Above Optimal:  100-129 mg/dLBorderline High:  130-159 mg/dLHigh:  160-189 mg/dLVery High:  >190 mg/dL    Angiogram 1/47/82: reviewed. Severe above knee  popliteal stenosis. Above and behind knee popliteal occlusion. Good result from intervention with restoration of inline flow to the foot  +-------+-----------+-----------+------------+------------+  ABI/TBIToday's ABIToday's TBIPrevious ABIPrevious TBI  +-------+-----------+-----------+------------+------------+  Right [place]         0.62       [place]          0.47          +-------+-----------+-----------+------------+------------+  Left  0.26       absent     0.51        0.15          +-------+-----------+-----------+------------+------------+       Rande Brunt. Lenell Antu, MD [facility] of [facility] Phone Number: [phone] 11/06/2023 2:40 PM   Total time spent on preparing this encounter including chart review, data review, collecting history, examining the patient, coordinating care for this established patient, 30 minutes.  Portions of this report may have been transcribed using voice recognition software.  Every effort has been made to ensure accuracy; however, inadvertent computerized transcription errors may still be present.

## 2023-11-08 ENCOUNTER — Other Ambulatory Visit: Payer: Self-pay

## 2023-11-08 ENCOUNTER — Other Ambulatory Visit (HOSPITAL_COMMUNITY): Payer: Self-pay | Admitting: Vascular Surgery

## 2023-11-08 ENCOUNTER — Ambulatory Visit (INDEPENDENT_AMBULATORY_CARE_PROVIDER_SITE_OTHER)
Admission: RE | Admit: 2023-11-08 | Discharge: 2023-11-08 | Disposition: A | Discharge status: Home / Self Care | Source: Ambulatory Visit | Attending: Vascular Surgery | Admitting: Vascular Surgery

## 2023-11-08 ENCOUNTER — Ambulatory Visit: Payer: PPO | Admitting: Vascular Surgery

## 2023-11-08 ENCOUNTER — Encounter: Payer: Self-pay | Admitting: Vascular Surgery

## 2023-11-08 VITALS — BP 113/49 | HR 68 | Temp 98.0°F

## 2023-11-08 DIAGNOSIS — I509 Heart failure, unspecified: Secondary | ICD-10-CM | POA: Diagnosis not present

## 2023-11-08 DIAGNOSIS — I214 Non-ST elevation (NSTEMI) myocardial infarction: Secondary | ICD-10-CM | POA: Diagnosis not present

## 2023-11-08 DIAGNOSIS — I739 Peripheral vascular disease, unspecified: Secondary | ICD-10-CM | POA: Diagnosis not present

## 2023-11-08 DIAGNOSIS — I70243 Atherosclerosis of native arteries of left leg with ulceration of ankle: Secondary | ICD-10-CM

## 2023-11-08 LAB — VAS US ABI WITH/WO TBI: Left ABI: ABSENT

## 2023-11-08 MED ORDER — HYDROCODONE-ACETAMINOPHEN 5-325 MG PO TABS: 1.0000 | ORAL_TABLET | Freq: Four times a day (QID) | ORAL | 0 refills | Status: DC | PRN

## 2023-11-09 ENCOUNTER — Encounter (HOSPITAL_COMMUNITY)

## 2023-11-09 ENCOUNTER — Other Ambulatory Visit (HOSPITAL_COMMUNITY)

## 2023-11-10 ENCOUNTER — Emergency Department (HOSPITAL_COMMUNITY)

## 2023-11-10 ENCOUNTER — Other Ambulatory Visit: Payer: Self-pay

## 2023-11-10 ENCOUNTER — Encounter (HOSPITAL_COMMUNITY)
Admission: EM | Disposition: A | Discharge status: Home / Self Care | Payer: Self-pay | Source: Home / Self Care | Attending: Internal Medicine

## 2023-11-10 ENCOUNTER — Encounter (HOSPITAL_COMMUNITY): Payer: Self-pay

## 2023-11-10 ENCOUNTER — Inpatient Hospital Stay (HOSPITAL_COMMUNITY)
Admission: EM | Admit: 2023-11-10 | Discharge: 2023-11-17 | DRG: 280 | Disposition: A | Discharge status: Home Health | Attending: Internal Medicine | Admitting: Internal Medicine

## 2023-11-10 ENCOUNTER — Inpatient Hospital Stay (HOSPITAL_COMMUNITY)

## 2023-11-10 DIAGNOSIS — E1151 Type 2 diabetes mellitus with diabetic peripheral angiopathy without gangrene: Secondary | ICD-10-CM | POA: Diagnosis present

## 2023-11-10 DIAGNOSIS — R0602 Shortness of breath: Secondary | ICD-10-CM | POA: Diagnosis not present

## 2023-11-10 DIAGNOSIS — Z7982 Long term (current) use of aspirin: Secondary | ICD-10-CM

## 2023-11-10 DIAGNOSIS — E1165 Type 2 diabetes mellitus with hyperglycemia: Secondary | ICD-10-CM | POA: Diagnosis not present

## 2023-11-10 DIAGNOSIS — I509 Heart failure, unspecified: Secondary | ICD-10-CM | POA: Diagnosis not present

## 2023-11-10 DIAGNOSIS — R079 Chest pain, unspecified: Secondary | ICD-10-CM | POA: Diagnosis not present

## 2023-11-10 DIAGNOSIS — I70234 Atherosclerosis of native arteries of right leg with ulceration of heel and midfoot: Secondary | ICD-10-CM | POA: Diagnosis present

## 2023-11-10 DIAGNOSIS — I272 Pulmonary hypertension, unspecified: Secondary | ICD-10-CM | POA: Diagnosis not present

## 2023-11-10 DIAGNOSIS — I70243 Atherosclerosis of native arteries of left leg with ulceration of ankle: Secondary | ICD-10-CM

## 2023-11-10 DIAGNOSIS — J9601 Acute respiratory failure with hypoxia: Principal | ICD-10-CM | POA: Diagnosis present

## 2023-11-10 DIAGNOSIS — I700 Atherosclerosis of aorta: Secondary | ICD-10-CM | POA: Diagnosis not present

## 2023-11-10 DIAGNOSIS — E1122 Type 2 diabetes mellitus with diabetic chronic kidney disease: Secondary | ICD-10-CM | POA: Diagnosis not present

## 2023-11-10 DIAGNOSIS — J9811 Atelectasis: Secondary | ICD-10-CM | POA: Diagnosis not present

## 2023-11-10 DIAGNOSIS — F0390 Unspecified dementia without behavioral disturbance: Secondary | ICD-10-CM | POA: Diagnosis not present

## 2023-11-10 DIAGNOSIS — I2510 Atherosclerotic heart disease of native coronary artery without angina pectoris: Secondary | ICD-10-CM | POA: Diagnosis present

## 2023-11-10 DIAGNOSIS — Z794 Long term (current) use of insulin: Secondary | ICD-10-CM | POA: Diagnosis not present

## 2023-11-10 DIAGNOSIS — L97922 Non-pressure chronic ulcer of unspecified part of left lower leg with fat layer exposed: Secondary | ICD-10-CM

## 2023-11-10 DIAGNOSIS — I4821 Permanent atrial fibrillation: Secondary | ICD-10-CM | POA: Diagnosis not present

## 2023-11-10 DIAGNOSIS — Z1152 Encounter for screening for COVID-19: Secondary | ICD-10-CM | POA: Diagnosis not present

## 2023-11-10 DIAGNOSIS — L97929 Non-pressure chronic ulcer of unspecified part of left lower leg with unspecified severity: Secondary | ICD-10-CM | POA: Diagnosis present

## 2023-11-10 DIAGNOSIS — I70242 Atherosclerosis of native arteries of left leg with ulceration of calf: Secondary | ICD-10-CM | POA: Diagnosis present

## 2023-11-10 DIAGNOSIS — I130 Hypertensive heart and chronic kidney disease with heart failure and stage 1 through stage 4 chronic kidney disease, or unspecified chronic kidney disease: Secondary | ICD-10-CM | POA: Diagnosis present

## 2023-11-10 DIAGNOSIS — E119 Type 2 diabetes mellitus without complications: Secondary | ICD-10-CM

## 2023-11-10 DIAGNOSIS — Z79890 Hormone replacement therapy (postmenopausal): Secondary | ICD-10-CM

## 2023-11-10 DIAGNOSIS — E1100 Type 2 diabetes mellitus with hyperosmolarity without nonketotic hyperglycemic-hyperosmolar coma (NKHHC): Secondary | ICD-10-CM

## 2023-11-10 DIAGNOSIS — N179 Acute kidney failure, unspecified: Secondary | ICD-10-CM | POA: Diagnosis not present

## 2023-11-10 DIAGNOSIS — N1832 Chronic kidney disease, stage 3b: Secondary | ICD-10-CM | POA: Diagnosis present

## 2023-11-10 DIAGNOSIS — J90 Pleural effusion, not elsewhere classified: Secondary | ICD-10-CM | POA: Diagnosis not present

## 2023-11-10 DIAGNOSIS — I4819 Other persistent atrial fibrillation: Secondary | ICD-10-CM | POA: Diagnosis not present

## 2023-11-10 DIAGNOSIS — E782 Mixed hyperlipidemia: Secondary | ICD-10-CM | POA: Diagnosis present

## 2023-11-10 DIAGNOSIS — I70245 Atherosclerosis of native arteries of left leg with ulceration of other part of foot: Secondary | ICD-10-CM | POA: Diagnosis not present

## 2023-11-10 DIAGNOSIS — E11621 Type 2 diabetes mellitus with foot ulcer: Secondary | ICD-10-CM | POA: Diagnosis not present

## 2023-11-10 DIAGNOSIS — R0600 Dyspnea, unspecified: Secondary | ICD-10-CM | POA: Diagnosis not present

## 2023-11-10 DIAGNOSIS — I10 Essential (primary) hypertension: Secondary | ICD-10-CM | POA: Diagnosis not present

## 2023-11-10 DIAGNOSIS — E039 Hypothyroidism, unspecified: Secondary | ICD-10-CM | POA: Diagnosis not present

## 2023-11-10 DIAGNOSIS — M109 Gout, unspecified: Secondary | ICD-10-CM | POA: Diagnosis present

## 2023-11-10 DIAGNOSIS — R739 Hyperglycemia, unspecified: Secondary | ICD-10-CM | POA: Diagnosis not present

## 2023-11-10 DIAGNOSIS — I5033 Acute on chronic diastolic (congestive) heart failure: Secondary | ICD-10-CM | POA: Diagnosis present

## 2023-11-10 DIAGNOSIS — Z888 Allergy status to other drugs, medicaments and biological substances status: Secondary | ICD-10-CM

## 2023-11-10 DIAGNOSIS — Z515 Encounter for palliative care: Secondary | ICD-10-CM | POA: Diagnosis not present

## 2023-11-10 DIAGNOSIS — E876 Hypokalemia: Secondary | ICD-10-CM | POA: Diagnosis not present

## 2023-11-10 DIAGNOSIS — K219 Gastro-esophageal reflux disease without esophagitis: Secondary | ICD-10-CM | POA: Diagnosis present

## 2023-11-10 DIAGNOSIS — E669 Obesity, unspecified: Secondary | ICD-10-CM | POA: Diagnosis present

## 2023-11-10 DIAGNOSIS — Z7985 Long-term (current) use of injectable non-insulin antidiabetic drugs: Secondary | ICD-10-CM

## 2023-11-10 DIAGNOSIS — Z6836 Body mass index (BMI) 36.0-36.9, adult: Secondary | ICD-10-CM

## 2023-11-10 DIAGNOSIS — I70222 Atherosclerosis of native arteries of extremities with rest pain, left leg: Secondary | ICD-10-CM | POA: Diagnosis not present

## 2023-11-10 DIAGNOSIS — Z882 Allergy status to sulfonamides status: Secondary | ICD-10-CM

## 2023-11-10 DIAGNOSIS — I214 Non-ST elevation (NSTEMI) myocardial infarction: Principal | ICD-10-CM | POA: Diagnosis present

## 2023-11-10 DIAGNOSIS — Z7189 Other specified counseling: Secondary | ICD-10-CM | POA: Diagnosis not present

## 2023-11-10 DIAGNOSIS — Z90710 Acquired absence of both cervix and uterus: Secondary | ICD-10-CM

## 2023-11-10 DIAGNOSIS — F419 Anxiety disorder, unspecified: Secondary | ICD-10-CM | POA: Diagnosis present

## 2023-11-10 DIAGNOSIS — R0902 Hypoxemia: Secondary | ICD-10-CM | POA: Diagnosis not present

## 2023-11-10 DIAGNOSIS — Z79899 Other long term (current) drug therapy: Secondary | ICD-10-CM

## 2023-11-10 DIAGNOSIS — I350 Nonrheumatic aortic (valve) stenosis: Secondary | ICD-10-CM | POA: Diagnosis not present

## 2023-11-10 DIAGNOSIS — D631 Anemia in chronic kidney disease: Secondary | ICD-10-CM | POA: Diagnosis not present

## 2023-11-10 DIAGNOSIS — I4891 Unspecified atrial fibrillation: Secondary | ICD-10-CM | POA: Diagnosis not present

## 2023-11-10 DIAGNOSIS — L97418 Non-pressure chronic ulcer of right heel and midfoot with other specified severity: Secondary | ICD-10-CM | POA: Diagnosis not present

## 2023-11-10 DIAGNOSIS — I110 Hypertensive heart disease with heart failure: Secondary | ICD-10-CM | POA: Diagnosis not present

## 2023-11-10 DIAGNOSIS — I2584 Coronary atherosclerosis due to calcified coronary lesion: Secondary | ICD-10-CM | POA: Diagnosis present

## 2023-11-10 DIAGNOSIS — G4730 Sleep apnea, unspecified: Secondary | ICD-10-CM | POA: Diagnosis present

## 2023-11-10 DIAGNOSIS — Z7984 Long term (current) use of oral hypoglycemic drugs: Secondary | ICD-10-CM

## 2023-11-10 DIAGNOSIS — I739 Peripheral vascular disease, unspecified: Secondary | ICD-10-CM | POA: Diagnosis not present

## 2023-11-10 DIAGNOSIS — I5030 Unspecified diastolic (congestive) heart failure: Secondary | ICD-10-CM | POA: Diagnosis not present

## 2023-11-10 DIAGNOSIS — R001 Bradycardia, unspecified: Secondary | ICD-10-CM | POA: Diagnosis present

## 2023-11-10 DIAGNOSIS — R0989 Other specified symptoms and signs involving the circulatory and respiratory systems: Secondary | ICD-10-CM | POA: Diagnosis not present

## 2023-11-10 DIAGNOSIS — E66811 Obesity, class 1: Secondary | ICD-10-CM | POA: Diagnosis present

## 2023-11-10 DIAGNOSIS — R000 Tachycardia, unspecified: Secondary | ICD-10-CM | POA: Diagnosis not present

## 2023-11-10 DIAGNOSIS — R918 Other nonspecific abnormal finding of lung field: Secondary | ICD-10-CM | POA: Diagnosis not present

## 2023-11-10 DIAGNOSIS — M8580 Other specified disorders of bone density and structure, unspecified site: Secondary | ICD-10-CM | POA: Diagnosis present

## 2023-11-10 DIAGNOSIS — Z8673 Personal history of transient ischemic attack (TIA), and cerebral infarction without residual deficits: Secondary | ICD-10-CM

## 2023-11-10 DIAGNOSIS — I447 Left bundle-branch block, unspecified: Secondary | ICD-10-CM | POA: Diagnosis present

## 2023-11-10 DIAGNOSIS — Z7901 Long term (current) use of anticoagulants: Secondary | ICD-10-CM

## 2023-11-10 DIAGNOSIS — Z887 Allergy status to serum and vaccine status: Secondary | ICD-10-CM

## 2023-11-10 DIAGNOSIS — I517 Cardiomegaly: Secondary | ICD-10-CM | POA: Diagnosis not present

## 2023-11-10 HISTORY — PX: LEFT HEART CATH AND CORONARY ANGIOGRAPHY: CATH118249

## 2023-11-10 LAB — CBC WITH DIFFERENTIAL/PLATELET
Abs Immature Granulocytes: 0.06 10*3/uL (ref 0.00–0.07)
Basophils Absolute: 0.1 10*3/uL (ref 0.0–0.1)
Basophils Relative: 1 %
Eosinophils Absolute: 0 10*3/uL (ref 0.0–0.5)
Eosinophils Relative: 0 %
HCT: 38.7 % (ref 36.0–46.0)
Hemoglobin: 12 g/dL (ref 12.0–15.0)
Immature Granulocytes: 1 %
Lymphocytes Relative: 10 %
Lymphs Abs: 1.1 10*3/uL (ref 0.7–4.0)
MCH: 27.5 pg (ref 26.0–34.0)
MCHC: 31 g/dL (ref 30.0–36.0)
MCV: 88.6 fL (ref 80.0–100.0)
Monocytes Absolute: 0.9 10*3/uL (ref 0.1–1.0)
Monocytes Relative: 8 %
Neutro Abs: 8.4 10*3/uL — ABNORMAL HIGH (ref 1.7–7.7)
Neutrophils Relative %: 80 %
Platelets: 181 10*3/uL (ref 150–400)
RBC: 4.37 MIL/uL (ref 3.87–5.11)
RDW: 16 % — ABNORMAL HIGH (ref 11.5–15.5)
WBC: 10.5 10*3/uL (ref 4.0–10.5)
nRBC: 0 % (ref 0.0–0.2)

## 2023-11-10 LAB — CBG MONITORING, ED
Glucose-Capillary: 169 mg/dL — ABNORMAL HIGH (ref 70–99)
Glucose-Capillary: 166 mg/dL — ABNORMAL HIGH (ref 70–99)

## 2023-11-10 LAB — LIPID PANEL
Cholesterol: 120 mg/dL (ref 0–200)
HDL: 27 mg/dL — ABNORMAL LOW (ref >40)
LDL Cholesterol: 78 mg/dL (ref 0–99)
Total CHOL/HDL Ratio: 4.4 ratio
Triglycerides: 77 mg/dL (ref <150)
VLDL: 15 mg/dL (ref 0–40)

## 2023-11-10 LAB — ECHOCARDIOGRAM COMPLETE
AR max vel: 0.79 cm2
AV Area VTI: 0.68 cm2
AV Area mean vel: 0.73 cm2
AV Mean grad: 18 mmHg
AV Peak grad: 29.2 mmHg
Ao pk vel: 2.7 m/s
Area-P 1/2: 2.37 cm2
Calc EF: 61.6 %
Height: 60 in
MV VTI: 1.45 cm2
P 1/2 time: 429 ms
S' Lateral: 3.5 cm
Single Plane A2C EF: 64.1 %
Single Plane A4C EF: 58.4 %
Weight: 3024 [oz_av]

## 2023-11-10 LAB — URINALYSIS, W/ REFLEX TO CULTURE (INFECTION SUSPECTED)
Bilirubin Urine: NEGATIVE
Glucose, UA: >=500 mg/dL — AB
Ketones, ur: NEGATIVE mg/dL
Nitrite: NEGATIVE
Protein, ur: NEGATIVE mg/dL
Specific Gravity, Urine: 1.014 (ref 1.005–1.030)
WBC, UA: >50 WBC/hpf (ref 0–5)
pH: 5 (ref 5.0–8.0)

## 2023-11-10 LAB — TROPONIN I (HIGH SENSITIVITY)
Troponin I (High Sensitivity): 466 ng/L (ref <18)
Troponin I (High Sensitivity): 1633 ng/L (ref <18)

## 2023-11-10 LAB — RESP PANEL BY RT-PCR (RSV, FLU A&B, COVID)  RVPGX2
Influenza A by PCR: NEGATIVE
Influenza B by PCR: NEGATIVE
Resp Syncytial Virus by PCR: NEGATIVE
SARS Coronavirus 2 by RT PCR: NEGATIVE

## 2023-11-10 LAB — BASIC METABOLIC PANEL
Anion gap: 11 (ref 5–15)
BUN: 21 mg/dL (ref 8–23)
CO2: 20 mmol/L — ABNORMAL LOW (ref 22–32)
Calcium: 8.9 mg/dL (ref 8.9–10.3)
Chloride: 106 mmol/L (ref 98–111)
Creatinine, Ser: 1.12 mg/dL — ABNORMAL HIGH (ref 0.44–1.00)
GFR, Estimated: 49 mL/min — ABNORMAL LOW (ref >60)
Glucose, Bld: 232 mg/dL — ABNORMAL HIGH (ref 70–99)
Potassium: 4.4 mmol/L (ref 3.5–5.1)
Sodium: 137 mmol/L (ref 135–145)

## 2023-11-10 LAB — TSH: TSH: 2.139 u[IU]/mL (ref 0.350–4.500)

## 2023-11-10 LAB — BRAIN NATRIURETIC PEPTIDE: B Natriuretic Peptide: 546.3 pg/mL — ABNORMAL HIGH (ref 0.0–100.0)

## 2023-11-10 LAB — C-REACTIVE PROTEIN: CRP: 1.9 mg/dL — ABNORMAL HIGH (ref <1.0)

## 2023-11-10 LAB — HEPARIN LEVEL (UNFRACTIONATED): Heparin Unfractionated: >1.1 [IU]/mL — ABNORMAL HIGH (ref 0.30–0.70)

## 2023-11-10 LAB — GLUCOSE, CAPILLARY
Glucose-Capillary: 164 mg/dL — ABNORMAL HIGH (ref 70–99)
Glucose-Capillary: 163 mg/dL — ABNORMAL HIGH (ref 70–99)

## 2023-11-10 LAB — APTT: aPTT: 94 s — ABNORMAL HIGH (ref 24–36)

## 2023-11-10 LAB — I-STAT CG4 LACTIC ACID, ED: Lactic Acid, Venous: 1.9 mmol/L (ref 0.5–1.9)

## 2023-11-10 SURGERY — LEFT HEART CATH AND CORONARY ANGIOGRAPHY
Anesthesia: LOCAL

## 2023-11-10 MED ORDER — ROSUVASTATIN CALCIUM 5 MG PO TABS
10.0000 mg | ORAL_TABLET | Freq: Every day | ORAL | Status: DC
  Administered 2023-11-11: 10 mg via ORAL
  Filled 2023-11-10: qty 2

## 2023-11-10 MED ORDER — FENTANYL CITRATE (PF) 100 MCG/2ML IJ SOLN
INTRAMUSCULAR | Status: DC | PRN
  Administered 2023-11-10: 25 ug via INTRAVENOUS

## 2023-11-10 MED ORDER — LEVOTHYROXINE SODIUM 75 MCG PO TABS
150.0000 ug | ORAL_TABLET | Freq: Every day | ORAL | Status: DC
  Administered 2023-11-10 – 2023-11-17 (×8): 150 ug via ORAL
  Filled 2023-11-10 (×8): qty 2

## 2023-11-10 MED ORDER — ACETAMINOPHEN 10 MG/ML IV SOLN
1000.0000 mg | Freq: Once | INTRAVENOUS | Status: AC
  Administered 2023-11-10: 1000 mg via INTRAVENOUS
  Filled 2023-11-10: qty 100

## 2023-11-10 MED ORDER — INSULIN NPH (HUMAN) (ISOPHANE) 100 UNIT/ML ~~LOC~~ SUSP
14.0000 [IU] | Freq: Two times a day (BID) | SUBCUTANEOUS | Status: DC
  Administered 2023-11-10 – 2023-11-13 (×3): 14 [IU] via SUBCUTANEOUS
  Filled 2023-11-10 (×3): qty 10

## 2023-11-10 MED ORDER — SODIUM CHLORIDE 0.9 % WEIGHT BASED INFUSION
3.0000 mL/kg/h | INTRAVENOUS | Status: DC
  Administered 2023-11-10: 3 mL/kg/h via INTRAVENOUS

## 2023-11-10 MED ORDER — VERAPAMIL HCL 2.5 MG/ML IV SOLN
INTRAVENOUS | Status: DC | PRN
  Administered 2023-11-10: 10 mL via INTRA_ARTERIAL

## 2023-11-10 MED ORDER — INSULIN ASPART 100 UNIT/ML IJ SOLN
0.0000 [IU] | Freq: Three times a day (TID) | INTRAMUSCULAR | Status: DC
  Administered 2023-11-10 – 2023-11-12 (×3): 2 [IU] via SUBCUTANEOUS
  Administered 2023-11-13 – 2023-11-15 (×4): 1 [IU] via SUBCUTANEOUS
  Administered 2023-11-16: 3 [IU] via SUBCUTANEOUS
  Administered 2023-11-16: 1 [IU] via SUBCUTANEOUS
  Administered 2023-11-17: 2 [IU] via SUBCUTANEOUS
  Administered 2023-11-17: 1 [IU] via SUBCUTANEOUS

## 2023-11-10 MED ORDER — SODIUM CHLORIDE 0.9% FLUSH: 3.0000 mL | INTRAVENOUS | Status: DC | PRN

## 2023-11-10 MED ORDER — LIDOCAINE HCL (PF) 1 % IJ SOLN
INTRAMUSCULAR | Status: DC | PRN
  Administered 2023-11-10: 2 mL

## 2023-11-10 MED ORDER — MIDAZOLAM HCL 2 MG/2ML IJ SOLN
INTRAMUSCULAR | Status: AC
  Filled 2023-11-10: qty 2

## 2023-11-10 MED ORDER — INSULIN ISOPHANE HUMAN 100 UNIT/ML KWIKPEN
14.0000 [IU] | PEN_INJECTOR | SUBCUTANEOUS | Status: DC
Start: 2023-11-10 — End: 2023-11-10

## 2023-11-10 MED ORDER — SODIUM CHLORIDE 0.9 % IV SOLN: 250.0000 mL | INTRAVENOUS | Status: AC | PRN

## 2023-11-10 MED ORDER — HEPARIN SODIUM (PORCINE) 1000 UNIT/ML IJ SOLN
INTRAMUSCULAR | Status: AC
  Filled 2023-11-10: qty 10

## 2023-11-10 MED ORDER — FENTANYL CITRATE (PF) 100 MCG/2ML IJ SOLN
INTRAMUSCULAR | Status: AC
  Filled 2023-11-10: qty 2

## 2023-11-10 MED ORDER — GABAPENTIN 300 MG PO CAPS
600.0000 mg | ORAL_CAPSULE | Freq: Every day | ORAL | Status: DC
  Administered 2023-11-10 – 2023-11-11 (×2): 600 mg via ORAL
  Filled 2023-11-10 (×2): qty 2

## 2023-11-10 MED ORDER — SODIUM CHLORIDE 0.9 % WEIGHT BASED INFUSION: 1.0000 mL/kg/h | INTRAVENOUS | Status: DC

## 2023-11-10 MED ORDER — LABETALOL HCL 5 MG/ML IV SOLN: 10.0000 mg | INTRAVENOUS | Status: AC | PRN

## 2023-11-10 MED ORDER — SODIUM CHLORIDE 0.9 % IV SOLN: INTRAVENOUS | Status: AC

## 2023-11-10 MED ORDER — HEPARIN SODIUM (PORCINE) 1000 UNIT/ML IJ SOLN
INTRAMUSCULAR | Status: DC | PRN
  Administered 2023-11-10: 4000 [IU] via INTRAVENOUS

## 2023-11-10 MED ORDER — IOHEXOL 350 MG/ML SOLN
INTRAVENOUS | Status: DC | PRN
  Administered 2023-11-10: 50 mL via INTRA_ARTERIAL

## 2023-11-10 MED ORDER — LIDOCAINE HCL (PF) 1 % IJ SOLN
INTRAMUSCULAR | Status: AC
  Filled 2023-11-10: qty 30

## 2023-11-10 MED ORDER — SODIUM CHLORIDE 0.9% FLUSH
3.0000 mL | Freq: Two times a day (BID) | INTRAVENOUS | Status: DC
  Administered 2023-11-10 – 2023-11-12 (×5): 3 mL via INTRAVENOUS

## 2023-11-10 MED ORDER — EZETIMIBE 10 MG PO TABS
10.0000 mg | ORAL_TABLET | Freq: Every evening | ORAL | Status: DC
Start: 2023-11-10 — End: 2023-11-17
  Administered 2023-11-10 – 2023-11-16 (×7): 10 mg via ORAL
  Filled 2023-11-10 (×7): qty 1

## 2023-11-10 MED ORDER — HYDROMORPHONE HCL 1 MG/ML IJ SOLN
0.5000 mg | Freq: Once | INTRAMUSCULAR | Status: AC
  Administered 2023-11-10: 0.5 mg via INTRAVENOUS
  Filled 2023-11-10: qty 0.5

## 2023-11-10 MED ORDER — ALPRAZOLAM 0.25 MG PO TABS
0.2500 mg | ORAL_TABLET | Freq: Every evening | ORAL | Status: DC | PRN
  Administered 2023-11-12 – 2023-11-16 (×3): 0.25 mg via ORAL
  Filled 2023-11-10 (×3): qty 1

## 2023-11-10 MED ORDER — HYDRALAZINE HCL 20 MG/ML IJ SOLN: 10.0000 mg | INTRAMUSCULAR | Status: AC | PRN

## 2023-11-10 MED ORDER — METOPROLOL TARTRATE 50 MG PO TABS
50.0000 mg | ORAL_TABLET | Freq: Two times a day (BID) | ORAL | Status: DC
  Administered 2023-11-10 – 2023-11-11 (×2): 50 mg via ORAL
  Filled 2023-11-10 (×2): qty 1

## 2023-11-10 MED ORDER — SODIUM CHLORIDE 0.9% FLUSH
3.0000 mL | Freq: Two times a day (BID) | INTRAVENOUS | Status: DC
  Administered 2023-11-10 – 2023-11-12 (×4): 3 mL via INTRAVENOUS

## 2023-11-10 MED ORDER — ACETAMINOPHEN 325 MG PO TABS: 650.0000 mg | ORAL_TABLET | ORAL | Status: DC | PRN

## 2023-11-10 MED ORDER — ASPIRIN 81 MG PO TBEC
81.0000 mg | DELAYED_RELEASE_TABLET | Freq: Every day | ORAL | Status: DC
  Administered 2023-11-11 – 2023-11-17 (×7): 81 mg via ORAL
  Filled 2023-11-10 (×7): qty 1

## 2023-11-10 MED ORDER — DONEPEZIL HCL 5 MG PO TABS
5.0000 mg | ORAL_TABLET | Freq: Every day | ORAL | Status: DC
  Administered 2023-11-10 – 2023-11-16 (×7): 5 mg via ORAL
  Filled 2023-11-10 (×7): qty 1

## 2023-11-10 MED ORDER — HYDROCODONE-ACETAMINOPHEN 5-325 MG PO TABS
1.0000 | ORAL_TABLET | Freq: Four times a day (QID) | ORAL | Status: DC | PRN
  Administered 2023-11-10 – 2023-11-15 (×9): 1 via ORAL
  Filled 2023-11-10 (×9): qty 1

## 2023-11-10 MED ORDER — SERTRALINE HCL 50 MG PO TABS
50.0000 mg | ORAL_TABLET | Freq: Every day | ORAL | Status: DC
  Administered 2023-11-11 – 2023-11-17 (×7): 50 mg via ORAL
  Filled 2023-11-10 (×7): qty 1

## 2023-11-10 MED ORDER — FUROSEMIDE 10 MG/ML IJ SOLN
40.0000 mg | Freq: Once | INTRAMUSCULAR | Status: AC
  Administered 2023-11-10: 40 mg via INTRAVENOUS
  Filled 2023-11-10: qty 4

## 2023-11-10 MED ORDER — HEPARIN (PORCINE) 25000 UT/250ML-% IV SOLN
1200.0000 [IU]/h | INTRAVENOUS | Status: DC
  Administered 2023-11-10: 1200 [IU]/h via INTRAVENOUS
  Filled 2023-11-10: qty 250

## 2023-11-10 MED ORDER — VERAPAMIL HCL 2.5 MG/ML IV SOLN
INTRAVENOUS | Status: AC
  Filled 2023-11-10: qty 2

## 2023-11-10 MED ORDER — IOHEXOL 350 MG/ML SOLN
75.0000 mL | Freq: Once | INTRAVENOUS | Status: AC | PRN
Start: 2023-11-10 — End: 2023-11-10
  Administered 2023-11-10: 75 mL via INTRAVENOUS

## 2023-11-10 MED ORDER — FUROSEMIDE 10 MG/ML IJ SOLN
40.0000 mg | Freq: Two times a day (BID) | INTRAMUSCULAR | Status: DC
  Administered 2023-11-10 – 2023-11-11 (×2): 40 mg via INTRAVENOUS
  Filled 2023-11-10 (×2): qty 4

## 2023-11-10 MED ORDER — HEPARIN (PORCINE) IN NACL 1000-0.9 UT/500ML-% IV SOLN
INTRAVENOUS | Status: DC | PRN
  Administered 2023-11-10 (×2): 500 mL via INTRA_ARTERIAL

## 2023-11-10 MED ORDER — METOPROLOL TARTRATE 25 MG PO TABS: 25.0000 mg | ORAL_TABLET | Freq: Two times a day (BID) | ORAL | Status: DC

## 2023-11-10 MED ORDER — MIDAZOLAM HCL 2 MG/2ML IJ SOLN
INTRAMUSCULAR | Status: DC | PRN
  Administered 2023-11-10: 1 mg via INTRAVENOUS

## 2023-11-10 MED ORDER — ASPIRIN 81 MG PO CHEW
324.0000 mg | CHEWABLE_TABLET | Freq: Once | ORAL | Status: AC
  Administered 2023-11-10: 324 mg via ORAL
  Filled 2023-11-10: qty 4

## 2023-11-10 MED ORDER — ONDANSETRON HCL 4 MG/2ML IJ SOLN: 4.0000 mg | Freq: Four times a day (QID) | INTRAMUSCULAR | Status: DC | PRN

## 2023-11-10 SURGICAL SUPPLY — 11 items
CATH 5FR JL3.5 JR4 ANG PIG MP (CATHETERS) IMPLANT
DEVICE RAD COMP TR BAND LRG (VASCULAR PRODUCTS) IMPLANT
GLIDESHEATH SLEND SS 6F .021 (SHEATH) IMPLANT
GUIDEWIRE INQWIRE 1.5J.035X260 (WIRE) IMPLANT
INQWIRE 1.5J .035X260CM (WIRE) ×1 IMPLANT
KIT SINGLE USE MANIFOLD (KITS) IMPLANT
KIT SYRINGE INJ CVI SPIKEX1 (MISCELLANEOUS) IMPLANT
PACK CARDIAC CATHETERIZATION (CUSTOM PROCEDURE TRAY) ×1 IMPLANT
SET ATX-X65L (MISCELLANEOUS) IMPLANT
SHEATH PROBE COVER 6X72 (BAG) IMPLANT
WIRE HI TORQ VERSACORE-J 145CM (WIRE) IMPLANT

## 2023-11-10 NOTE — Progress Notes (Signed)
 TR band removed - site clean dry intact

## 2023-11-10 NOTE — ED Provider Notes (Signed)
 Tina Hester EMERGENCY DEPARTMENT AT [facility] Provider Note   CSN: [id number] Arrival date & time: 11/10/23  0129     History  Chief Complaint  Patient presents with   Shortness of Breath    Tina Hester is a 83 y.o. female.  Presents to the emergency department by ambulance from home.  Patient reports that her daughter worries too much.  Daughter called an ambulance because the patient was short of breath.  EMS does report that her room air oxygen saturations were 87% and she was placed on oxygen.  At arrival to the ED, however, she is on room air and reports that she does not feel short of breath.  She does report that she has not been feeling well for the last couple days, has had some cough and congestion.  Denies chest pain, heart palpitations.       Home Medications Prior to Admission medications   Medication Sig Start Date End Date Taking? Authorizing Provider  acetaminophen (TYLENOL) 325 MG tablet Take 650 mg by mouth every 4 (four) hours as needed for mild pain (pain score 1-3) or moderate pain (pain score 4-6).    [provider]  ALPRAZolam Prudy Feeler) 0.25 MG tablet Take 1 tablet (0.25 mg total) by mouth at bedtime as needed for anxiety. 01/03/23   Sherryll Burger, Pratik D, DO  apixaban (ELIQUIS) 5 MG TABS tablet Take 1 tablet (5 mg total) by mouth 2 (two) times daily. 07/01/23   Quintella Reichert, MD  donepezil (ARICEPT) 5 MG tablet Take 5 mg by mouth at bedtime. 12/17/22   [provider]  empagliflozin (JARDIANCE) 10 MG TABS tablet Take 10 mg by mouth daily.    [provider]  ezetimibe (ZETIA) 10 MG tablet Take 10 mg by mouth every evening. 12/17/22   [provider]  gabapentin (NEURONTIN) 300 MG capsule Take 600 mg by mouth at bedtime.    [provider]  HYDROcodone-acetaminophen (NORCO/VICODIN) 5-325 MG tablet Take 1 tablet by mouth every 6 (six) hours as needed for moderate pain (pain score 4-6). 11/08/23   Leonie Douglas, MD  insulin lispro (HUMALOG) 100 UNIT/ML injection Inject 1-7 Units into the skin 3 (three) times daily before meals. Sliding scale 141-180=1 unit,181-220= 2 units,221-260= 3 units,261-300=4 units, 301-340=5 units, 341-380=6 units, 381-400 = 7 units,> 400 call MD    [provider]  Insulin NPH, Human,, Isophane, (NOVOLIN N FLEXPEN) 100 UNIT/ML Kiwkpen Inject 15 units at morning and 18 units at night Patient taking differently: Inject 14-18 Units into the skin See admin instructions. Inject 14 units at morning and 18 units at night 09/24/21   Reather Littler, MD  levothyroxine (SYNTHROID) 150 MCG tablet Take 150 mcg by mouth every morning. 04/13/23   [provider]  metoprolol tartrate (LOPRESSOR) 50 MG tablet Take 1 tablet (50 mg total) by mouth 2 (two) times daily. Patient taking differently: Take 25 mg by mouth 2 (two) times daily. 05/07/22   Quintella Reichert, MD  olmesartan (BENICAR) 20 MG tablet Take 20 mg by mouth every evening. 12/17/22   [provider]  omeprazole (PRILOSEC) 20 MG capsule Take 20 mg by mouth daily as needed (acid reflux). 04/13/23   [provider]  OZEMPIC, 0.25 OR 0.5 MG/DOSE, 2 MG/3ML SOPN Inject 0.25 mg into the skin once a week. Sundays 03/14/23   [provider]  sertraline (ZOLOFT) 50 MG tablet Take 50 mg by mouth daily. 12/17/22   [provider]      Allergies    Pneumococcal vaccine, Pneumovax 23 [pneumococcal vac polyvalent], Other, Sulfa antibiotics, and Pregabalin    Review of Systems   Review of Systems  Physical Exam Updated Vital Signs BP (!) 152/80   Pulse 83   Temp 98 F (36.7 C) (Oral)   Resp 19   Ht 5' (1.524 m)   Wt 85.7 kg   SpO2 98%   BMI 36.91 kg/m  Physical Exam Vitals and nursing note reviewed.  Constitutional:      General: She is not in acute distress.    Appearance: She is well-developed.  HENT:     Head: Normocephalic and atraumatic.     Mouth/Throat:     Mouth: Mucous membranes  are moist.  Eyes:     General: Vision grossly intact. Gaze aligned appropriately.     Extraocular Movements: Extraocular movements intact.     Conjunctiva/sclera: Conjunctivae normal.  Cardiovascular:     Rate and Rhythm: Normal rate. Rhythm irregular.     Pulses: Normal pulses.     Heart sounds: Normal heart sounds, S1 normal and S2 normal. No murmur heard.    No friction rub. No gallop.  Pulmonary:     Effort: Pulmonary effort is normal. No respiratory distress.     Breath sounds: Normal breath sounds.  Abdominal:     General: Bowel sounds are normal.     Palpations: Abdomen is soft.     Tenderness: There is no abdominal tenderness. There is no guarding or rebound.     Hernia: No hernia is present.  Musculoskeletal:        General: No swelling.     Cervical back: Full passive range of motion without pain, normal range of motion and neck supple. No spinous process tenderness or muscular tenderness. Normal range of motion.     Right lower leg: No edema.     Left lower leg: No edema.  Skin:    General: Skin is warm and dry.     Capillary Refill: Capillary refill takes less than 2 seconds.     Findings: No ecchymosis, erythema, rash or wound.  Neurological:     General: No focal deficit present.     Mental Status: She is alert and oriented to person, place, and time.     GCS: GCS eye subscore is 4. GCS verbal subscore is 5. GCS motor subscore is 6.     Cranial Nerves: Cranial nerves 2-12 are intact.     Sensory: Sensation is intact.     Motor: Motor function is intact.     Coordination: Coordination is intact.  Psychiatric:        Attention and Perception: Attention normal.        Mood and Affect: Mood normal.        Speech: Speech normal.        Behavior: Behavior normal.     ED Results / Procedures / Treatments   Labs (all labs ordered are listed, but only abnormal results are displayed) Labs Reviewed  CBC WITH DIFFERENTIAL/PLATELET - Abnormal; Notable for the following  components:      Result Value   RDW 16.0 (*)    Neutro Abs 8.4 (*)    All other components within normal limits  BASIC METABOLIC PANEL - Abnormal; Notable for the following components:   CO2 20 (*)    Glucose, Bld 232 (*)    Creatinine, Ser 1.12 (*)    GFR, Estimated 49 (*)  All other components within normal limits  BRAIN NATRIURETIC PEPTIDE - Abnormal; Notable for the following components:   B Natriuretic Peptide 546.3 (*)    All other components within normal limits  TROPONIN I (HIGH SENSITIVITY) - Abnormal; Notable for the following components:   Troponin I (High Sensitivity) 466 (*)    All other components within normal limits  TROPONIN I (HIGH SENSITIVITY) - Abnormal; Notable for the following components:   Troponin I (High Sensitivity) 1,633 (*)    All other components within normal limits  RESP PANEL BY RT-PCR (RSV, FLU A&B, COVID)  RVPGX2  URINALYSIS, W/ REFLEX TO CULTURE (INFECTION SUSPECTED)  HEPARIN LEVEL (UNFRACTIONATED)  APTT  I-STAT CG4 LACTIC ACID, ED    EKG EKG Interpretation Date/Time:  Thursday November 10 2023 01:32:52 EDT Ventricular Rate:  111 PR Interval:    QRS Duration:  128 QT Interval:  384 QTC Calculation: 522 R Axis:   -61  Text Interpretation: Atrial fibrillation Left bundle branch block No significant change since last tracing Confirmed by Gilda Crease [phone]) on 11/10/2023 1:54:01 AM  Radiology CT Angio Chest Pulmonary Embolism (PE) W or WO Contrast Result Date: 11/10/2023 CLINICAL DATA:  Pulmonary embolism EXAM: CT ANGIOGRAPHY CHEST WITH CONTRAST TECHNIQUE: Multidetector CT imaging of the chest was performed using the standard protocol during bolus administration of intravenous contrast. Multiplanar CT image reconstructions and MIPs were obtained to evaluate the vascular anatomy. RADIATION DOSE REDUCTION: This exam was performed according to the departmental dose-optimization program which includes automated exposure control,  adjustment of the mA and/or kV according to patient size and/or use of iterative reconstruction technique. CONTRAST:  75mL OMNIPAQUE IOHEXOL 350 MG/ML SOLN COMPARISON:  None Available. FINDINGS: Cardiovascular: Adequate opacification of the pulmonary arterial tree. No intraluminal filling defect identified to suggest acute pulmonary embolism. Central pulmonary arteries are of normal caliber. Extensive multi-vessel coronary artery calcification. Cardiac size within normal limits. No pericardial effusion. Moderate atherosclerotic calcification within the thoracic aorta. No aortic aneurysm. Mediastinum/Nodes: No enlarged mediastinal, hilar, or axillary lymph nodes. Thyroid gland, trachea, and esophagus demonstrate no significant findings. Lungs/Pleura: Small bilateral pleural effusions are present. There is diffuse but perihilar predominant ground-glass pulmonary infiltrates with cyst of the septal thickening most in keeping with interstitial and alveolar pulmonary edema. No pneumothorax. Thickening of the peribronchovascular interstitium in keeping with interstitial pulmonary edema. No central obstructing mass. Upper Abdomen: No acute abnormality. Reflux of contrast into the hepatic venous system in keeping with some degree of right heart failure. Musculoskeletal: Degenerative changes are seen within the thoracic spine flowing disc osteophytes throughout thoracic spine in keeping with changes of diffuse idiopathic skeletal hyperostosis. No acute bone abnormality. Review of the MIP images confirms the above findings. IMPRESSION: 1. No pulmonary embolism. 2. Extensive multi-vessel coronary artery calcification. 3. Small bilateral pleural effusions with diffuse but perihilar predominant ground-glass pulmonary infiltrates with cyst of the septal thickening most in keeping with interstitial and alveolar pulmonary edema, likely cardiogenic in nature. Aortic Atherosclerosis (ICD10-I70.0). Electronically Signed   By: Helyn Numbers M.D.   On: 11/10/2023 04:17   DG Chest Port 1 View Result Date: 11/10/2023 CLINICAL DATA:  Dyspnea EXAM: PORTABLE CHEST 1 VIEW COMPARISON:  12/12/2017 FINDINGS: The lungs are symmetrically well expanded. Mild right basilar atelectasis or infiltrate. No pneumothorax or pleural effusion. Mild cardiomegaly. Central pulmonary vascular congestion without overt pulmonary edema. No acute bone abnormality. IMPRESSION: 1. Mild right basilar atelectasis or infiltrate. 2. Mild cardiomegaly. Electronically Signed   By: Lyda Kalata.D.  On: 11/10/2023 02:59   VAS [place] LOWER EXTREMITY ARTERIAL DUPLEX Result Date: 11/08/2023 LOWER EXTREMITY ARTERIAL DUPLEX STUDY Patient Name:  KARINNA BEADLES  Date of Exam:   11/08/2023 Medical Rec #: [id number]           Accession #:    [id number] Date of Birth: 1941-04-01           Patient Gender: F Patient Age:   61 years Exam Location:  [facility] Procedure:      VAS [place] LOWER EXTREMITY ARTERIAL DUPLEX Referring Phys: Heath Lark --------------------------------------------------------------------------------  Indications: Rest pain, and peripheral artery disease. High Risk Factors: Hypertension, hyperlipidemia, Diabetes, no history of                    smoking, coronary artery disease.  Current ABI: right= noncompressible/0.31, left=absent/absent Comparison Study: No prior study Performing Technologist: Gertie Fey MHA, RDMS, RVT, RDCS  Examination Guidelines: A complete evaluation includes B-mode imaging, spectral Doppler, color Doppler, and power Doppler as needed of all accessible portions of each vessel. Bilateral testing is considered an integral part of a complete examination. Limited examinations for reoccurring indications may be performed as noted.   +-----------+--------+-----+---------------+-------------------+--------+ LEFT       PSV cm/sRatioStenosis       Waveform           Comments  +-----------+--------+-----+---------------+-------------------+--------+ CIA Mid    285          50-74% stenosismonophasic                  +-----------+--------+-----+---------------+-------------------+--------+ EIA Prox   66                          monophasic                  +-----------+--------+-----+---------------+-------------------+--------+ EIA Mid    340          50-74% stenosismonophasic                  +-----------+--------+-----+---------------+-------------------+--------+ EIA Distal 65                          monophasic                  +-----------+--------+-----+---------------+-------------------+--------+ CFA Distal 73                          monophasic                  +-----------+--------+-----+---------------+-------------------+--------+ DFA        61                          monophasic                  +-----------+--------+-----+---------------+-------------------+--------+ SFA Prox   15                          Dampened monophasic         +-----------+--------+-----+---------------+-------------------+--------+ SFA Mid    17                          dampened monophasic         +-----------+--------+-----+---------------+-------------------+--------+ SFA Distal 14  dampened monophasic         +-----------+--------+-----+---------------+-------------------+--------+ POP Prox   46                          monophasic                  +-----------+--------+-----+---------------+-------------------+--------+ POP Distal 44                          monophasic                  +-----------+--------+-----+---------------+-------------------+--------+ TP Trunk   26                          dampened monophasic         +-----------+--------+-----+---------------+-------------------+--------+ ATA Prox   20                          dampened monophasic          +-----------+--------+-----+---------------+-------------------+--------+ ATA Mid    23                          dampened monophasic         +-----------+--------+-----+---------------+-------------------+--------+ ATA Distal 23                          dampened monophasic         +-----------+--------+-----+---------------+-------------------+--------+ PTA Prox   24                          dampened monophasic         +-----------+--------+-----+---------------+-------------------+--------+ PTA Mid    30                          dampened monophasic         +-----------+--------+-----+---------------+-------------------+--------+ PTA Distal 26                          dampened monophasic         +-----------+--------+-----+---------------+-------------------+--------+ PERO Mid   24                          dampened monophasic         +-----------+--------+-----+---------------+-------------------+--------+ PERO Distal                            unable to insonate          +-----------+--------+-----+---------------+-------------------+--------+  Summary: Left: 50-74% stenosis noted in the common and external iliac arteries. Heavily calcified common and external iliac arteries. Dampened monophasic flow in lower extremity arterial system.  See table(s) above for measurements and observations. Electronically signed by Heath Lark on 11/08/2023 at 4:42:55 PM.    Final    VAS [place] ABI WITH/WO TBI Result Date: 11/08/2023  LOWER EXTREMITY DOPPLER STUDY Patient Name:  NYEMA HACHEY  Date of Exam:   11/08/2023 Medical Rec #: [id number]           Accession #:    [id number] Date of Birth: [birth year]           Patient Gender: F Patient Age:   50 years Exam Location:  [facility] Procedure:      VAS [place] ABI WITH/WO TBI Referring Phys: Heath Lark --------------------------------------------------------------------------------  Indications: Rest pain, and peripheral artery  disease. High Risk Factors: Hypertension, hyperlipidemia, Diabetes, no history of                    smoking.  Comparison Study: 08/30/2023 ABI/TBI- right=Noncompressible/0.62,                   left=0.26/absent Performing Technologist: Gertie Fey MHA, RVT, RDCS, RDMS  Examination Guidelines: A complete evaluation includes at minimum, Doppler waveform signals and systolic blood pressure reading at the level of bilateral brachial, anterior tibial, and posterior tibial arteries, when vessel segments are accessible. Bilateral testing is considered an integral part of a complete examination. Photoelectric Plethysmograph (PPG) waveforms and toe systolic pressure readings are included as required and additional duplex testing as needed. Limited examinations for reoccurring indications may be performed as noted.  ABI Findings: +---------+------------------+-----+----------+--------+ Right    Rt Pressure (mmHg)IndexWaveform  Comment  +---------+------------------+-----+----------+--------+ Brachial 153                                       +---------+------------------+-----+----------+--------+ ATA      Noncompressible        monophasic         +---------+------------------+-----+----------+--------+ PTA      Noncompressible        monophasic         +---------+------------------+-----+----------+--------+ Great Toe51                0.31                    +---------+------------------+-----+----------+--------+ +---------+------------------+-----+------------------+-------+ Left     Lt Pressure (mmHg)IndexWaveform          Comment +---------+------------------+-----+------------------+-------+ Brachial 162                                              +---------+------------------+-----+------------------+-------+ ATA                             unable to insonate        +---------+------------------+-----+------------------+-------+ PTA                              unable to insonate        +---------+------------------+-----+------------------+-------+ Great Toe0                 0.00                           +---------+------------------+-----+------------------+-------+ +-------+---------------+-----------+---------------+------------+ ABI/TBIToday's ABI    Today's TBIPrevious ABI   Previous TBI +-------+---------------+-----------+---------------+------------+ Right  Noncompressible0.31       Noncompressible0.62         +-------+---------------+-----------+---------------+------------+ Left   Absent         Absent     0.26           Absent       +-------+---------------+-----------+---------------+------------+  Right ABIs appear essentially unchanged compared to prior study on 08/30/2023. Left ABIs appear decreased compared to prior study on 08/30/2023.  Summary: Right: Resting right ankle-brachial index indicates noncompressible right  lower extremity arteries. The right toe-brachial index is abnormal. Left: Resting left ankle-brachial index indicates critical left limb ischemia. The left toe-brachial index is abnormal. *See table(s) above for measurements and observations.  Electronically signed by Heath Lark on 11/08/2023 at 4:42:39 PM.    Final     Procedures Procedures    Medications Ordered in ED Medications  heparin ADULT infusion 100 units/mL (25000 units/267mL) (1,200 Units/hr Intravenous New Bag/Given 11/10/23 0422)  iohexol (OMNIPAQUE) 350 MG/ML injection 75 mL (75 mLs Intravenous Contrast Given 11/10/23 0347)  aspirin chewable tablet 324 mg (324 mg Oral Given 11/10/23 0447)  furosemide (LASIX) injection 40 mg (40 mg Intravenous Given 11/10/23 0453)    ED Course/ Medical Decision Making/ A&P                                 Medical Decision Making Amount and/or Complexity of Data Reviewed Labs: ordered. Radiology: ordered.  Risk OTC drugs. Prescription drug management.   Dents to the emergency department for  evaluation of shortness of breath.  She reportedly complained of chest pain at home earlier.  EMS found patient to be mildly hypoxic, placed on supplemental oxygen and brought her to the ED.  At arrival she reports no complaints.  She reports she does not know why her daughter called an ambulance, she does not feel sick.  Patient is requiring nasal cannula oxygen, drops into the 87% range off of oxygen.  Chest x-ray without obvious reason for her mild hypoxia.  Patient is in A-fib.  Reviewing her records reveals that she is in permanent A-fib.  She is rate controlled.  No obvious ischemia or infarct.  First troponin, however, is elevated at 600.  Although the patient is on Eliquis for her A-fib, she has been reportedly off of it this week.  She underwent CT angiography to further evaluate.  No evidence of PE, evidence of pulmonary edema is seen.  Second troponin returned at 1600.  This was discussed with Dr. Geraldo Pitter, on-call for cardiology.  Agrees with heparinization and light diuresis.  Will consult on the patient.  Admit to medicine.        Final Clinical Impression(s) / ED Diagnoses Final diagnoses:  Acute respiratory failure with hypoxia (HCC)  Congestive heart failure, unspecified HF chronicity, unspecified heart failure type [facility])    Rx / DC Orders ED Discharge Orders     None         Prairie Stenberg, Canary Brim, MD 11/10/23 [phone]

## 2023-11-10 NOTE — ED Triage Notes (Signed)
 Patient Tina Hester [facility] from home due to SOB. Patient unsure of when SOB started. With EMS patient sating 87% on room air, does not usually require O2. Patient requiring 3L at this time. Patient hx of afib. Patient is A&Ox4 at this time.

## 2023-11-10 NOTE — Interval H&P Note (Signed)
 History and Physical Interval Note:  11/10/2023 4:02 PM  Tina Hester  has presented today for surgery, with the diagnosis of nstemi.  The various methods of treatment have been discussed with the patient and family. After consideration of risks, benefits and other options for treatment, the patient has consented to  Procedure(s): LEFT HEART CATH AND CORONARY ANGIOGRAPHY (N/A) as a surgical intervention.  The patient's history has been reviewed, patient examined, no change in status, stable for surgery.  I have reviewed the patient's chart and labs.  Questions were answered to the patient's satisfaction.    Cath Lab Visit (complete for each Cath Lab visit)  Clinical Evaluation Leading to the Procedure:   ACS: Yes.    Non-ACS:    Anginal Classification: CCS III  Anti-ischemic medical therapy: Minimal Therapy (1 class of medications)  Non-Invasive Test Results: No non-invasive testing performed  Prior CABG: No previous CABG    Verne Carrow

## 2023-11-10 NOTE — Progress Notes (Signed)
 Heart Failure Navigator Progress Note  Assessed for Heart & Vascular TOC clinic readiness.  Patient does not meet criteria due to per MD note patient with Dementia. .   Navigator will sign off at this time.   Rhae Hammock, BSN, Scientist, clinical (histocompatibility and immunogenetics) Only

## 2023-11-10 NOTE — ED Notes (Signed)
Updated daughter on patient status.

## 2023-11-10 NOTE — Progress Notes (Signed)
 Patient Name: Tina Hester Date of Encounter: 11/10/2023 [facility] Cardiologist: Armanda Magic, MD   Interval Summary  .    Patient reports feeling well this AM. She reports that she is in the hospital because of a "bad leg". She has been having pain and a nonhealing wound on her left leg. She denies having any chest pain. Reports that her daughter believes that she is short of breath, but that she does not feel this way. Patient does have underlying dementia, believes that the year is 2024 and the month is February. She lives with her daughter. When daughter called EMS, she reported that the patient was having chest pain.   Vital Signs .    Vitals:   11/10/23 0400 11/10/23 0530 11/10/23 0729 11/10/23 0730  BP:    (!) 150/86  Pulse:    87  Resp:    18  Temp:  98.2 F (36.8 C) 97.7 F (36.5 C)   TempSrc:  Oral Temporal   SpO2:    90%  Weight: 85.7 kg     Height: 5' (1.524 m)       Intake/Output Summary (Last 24 hours) at 11/10/2023 0855 Last data filed at 11/10/2023 0645 Gross per 24 hour  Intake --  Output 700 ml  Net -700 ml      11/10/2023    4:00 AM 07/04/2023    2:55 PM 05/08/2023    7:49 AM  Last 3 Weights  Weight (lbs) 189 lb 189 lb 199 lb 15.3 oz  Weight (kg) 85.73 kg 85.73 kg 90.7 kg      Telemetry/ECG    Atrial fibrillation, HR in the 100s  - Personally Reviewed  Physical Exam .   GEN: No acute distress.  Sitting upright in the bed in no acute distress  Neck: No JVD Cardiac: Irregular rate and rhythm. no murmurs, rubs, or gallops. Radial pulses 2+ bilaterally  Respiratory: Clear to auscultation bilaterally. Normal WOB on room Air  GI: Soft, nontender, non-distended  MS: No edema in BLE   Assessment & Plan .     NSTEMI  Coronary Artery Disease  - Patient presented with chest discomfort, dyspnea. hsTn [id number] - Patient has a past medical history of PAD, HTN, HLD, type 2 DM. CTA chest this admission noted extensive multi-vessel coronary  artery calcification  - Patient denies chest pain, shortness of breath. Currently resting comfortably in the bed.  - Plan for LHC today for NSTEMI  - Echocardiogram has been ordered and is pending  - Continue IV heparin  - Patient was given ASA 324 mg in the ED. Continue ASA 81 mg daily moving forward  - Continue zetia 10 mg daily. Start crestor 10 mg daily and titrate as tolerated - Continue metoprolol, increasing dose for improved HR control   Peripheral Artery Disease  - Followed by vascular surgery. Was initially scheduled for lower extremity angiogram for chronic limb threatening ischemia and left lateral calf nonhealing ulcer on 3/14 (tomorrow)  - Now admitted with NSTEMI  - Continue IV heparin, ASA, zetia  - Vascular surgery has been consulted   Carotid Artery Disease  - Carotid ultrasound from 2020 showed 1-39% stenosis in bilateral ICAs  - Continue ASA, zetia  Persistent Atrial Fibrillation  - EKG in the ED showed atrial fibrillation, HR 111 BPM, LBBB (chronic) - Per telemetry, HR has been in the low 100s. BP elevated  - Increase metoprolol to 50 mg BID for HR and BP control  -  Patient had been holding eliquis at home in preparation for angiogram. Last dose 3/11 - Now on IV heparin as above   HLD  - Lipid panel this admission pending  - Patient was previously on lipitor, but developed leg cramps. Lipitor was held in 07/2023 - With NSTEMI and PAD, would recommend resuming statin therapy. Start crestor 10 mg daily for now. Titrate as tolerated    For questions or updates, please contact [facility] Please consult [email or web address] for contact info under        Signed, Jonita Albee, PA-C

## 2023-11-10 NOTE — Progress Notes (Signed)
 [facility] PROGRESS NOTE   Tina Hester WUJ:[id number] DOB: [birth year] DOA: 11/10/2023  PCP: [name], MD  Brief History: 83 y.o. female with medical history significant of hypertension, hyperlipidemia, persistent atrial fibrillation, peripheral vascular disease, diabetes mellitus type 2, hypothyroidism, dementia, and GERD who presents with complaints of chest pain and shortness of breath.  Patient recently developed an ulcer in the left leg and was seen by vascular surgery recently with plans for an angiogram on 3/14.  Patient was found to have elevated troponin levels.  Patient was seen by cardiology.  Patient was hospitalized for further management.   Consultants: Cardiology.  Will inform vascular surgery as well.  Procedures: None yet    Subjective/Interval History: Patient denies any chest pain this morning.  She is wondering when she can go home.  Denies any shortness of breath.  Some pain in the left leg around the ulcer site.    Assessment/Plan:  NSTEMI Seen by cardiology.  Currently on a heparin infusion.  Echocardiogram is pending. Not noted to be on aspirin.  Will initiate 81 mg of aspirin.  Noted to be on statin and Zetia.  Will check lipid panel.  Concern for CHF/acute respiratory failure with hypoxia She was noted to have saturations of 89% on room air.  Placed on oxygen.  Was given Lasix x 1. Follow-up on echocardiogram.  Previous echocardiogram from 2023 showed normal LVEF.  Diastolic function could not be evaluated. Strict ins and outs and daily weights.  Left leg ulcer/peripheral artery disease Developed ulcer in the left lateral calf area about 2 to 3 weeks ago.  Was seen by vascular surgery recently.  Recent ABIs noted with concern raised for left lower extremity ischemia.   Plan is for angiogram on 3/14.  May need to be coordinated with cardiology if they plan to do coronary angiogram. Her anticoagulation is currently on hold.  Continue  statin.  Continue aspirin.  Permanent atrial fibrillation Anticoagulated with Eliquis which is currently on hold.  Noted to be on metoprolol which is being continued.  Diabetes mellitus type 2, uncontrolled with hyperglycemia Last HbA1c is 8 from September.  Noted to be on Jardiance and NPH at home.  This is being continued.  Monitor CBGs.  SSI.  Essential hypertension Monitor blood pressures closely.  Chronic kidney disease stage IIIb Monitor renal function closely.  Hyperlipidemia Continue statin and Zetia.  Was not on statin prior to admission.  History of dementia Continue donepezil.  Hypothyroidism Continue levothyroxine.  Obesity Estimated body mass index is 36.91 kg/m as calculated from the following:   Height as of this encounter: 5' (1.524 m).   Weight as of this encounter: 85.7 kg.  DVT Prophylaxis: On IV heparin.  Chronically anticoagulated with Eliquis Code Status: Full code Family Communication: Discussed with patient.  No family at bedside Disposition Plan: Hopefully return home when improved  Status is: Inpatient Remains inpatient appropriate because: NSTEMI, peripheral artery disease      Medications: Scheduled:  donepezil  5 mg Oral QHS   ezetimibe  10 mg Oral QPM   furosemide  40 mg Intravenous BID   gabapentin  600 mg Oral QHS   insulin aspart  0-9 Units Subcutaneous TID WC   Insulin NPH (Human) (Isophane)  14-18 Units Subcutaneous See admin instructions   levothyroxine  150 mcg Oral Q0600   metoprolol tartrate  25 mg Oral BID   sertraline  50 mg Oral Daily   sodium chloride flush  3 mL Intravenous  Q12H   Continuous:  sodium chloride     heparin 1,200 Units/hr (11/10/23 0807)   ZOX:WRUEAV chloride, acetaminophen, HYDROcodone-acetaminophen, ondansetron (ZOFRAN) IV, sodium chloride flush  Antibiotics: Anti-infectives (From admission, onward)    None       Objective:  Vital Signs  Vitals:   11/10/23 0400 11/10/23 0530 11/10/23  0729 11/10/23 0730  BP:    (!) 150/86  Pulse:    87  Resp:    18  Temp:  98.2 F (36.8 C) 97.7 F (36.5 C)   TempSrc:  Oral Temporal   SpO2:    90%  Weight: 85.7 kg     Height: 5' (1.524 m)       Intake/Output Summary (Last 24 hours) at 11/10/2023 0830 Last data filed at 11/10/2023 0645 Gross per 24 hour  Intake --  Output 700 ml  Net -700 ml   Filed Weights   11/10/23 0400  Weight: 85.7 kg    General appearance: Awake alert.  In no distress Resp: Clear to auscultation bilaterally.  Normal effort Cardio: S1-S2 is normal regular.  No S3-S4.  No rubs murmurs or bruit GI: Abdomen is soft.  Nontender nondistended.  Bowel sounds are present normal.  No masses organomegaly Extremities: Shallow ulcer noted over the left lateral calf area.  Yellowish exudate.  No bleeding. Neurologic:  No focal neurological deficits.    Lab Results:  Data Reviewed: I have personally reviewed following labs and reports of the imaging studies  CBC: Recent Labs  Lab 11/10/23 0138  WBC 10.5  NEUTROABS 8.4*  HGB 12.0  HCT 38.7  MCV 88.6  PLT 181    Basic Metabolic Panel: Recent Labs  Lab 11/10/23 0138  NA 137  K 4.4  CL 106  CO2 20*  GLUCOSE 232*  BUN 21  CREATININE 1.12*  CALCIUM 8.9    GFR: Estimated Creatinine Clearance: 37.7 mL/min (A) (by C-G formula based on SCr of 1.12 mg/dL (H)).   CBG: Recent Labs  Lab 11/10/23 0826  GLUCAP 169*    Thyroid Function Tests: Recent Labs    11/10/23 0325  TSH 2.139     Recent Results (from the past 240 hours)  Resp panel by RT-PCR (RSV, Flu A&B, Covid) Anterior Nasal Swab     Status: None   Collection Time: 11/10/23  1:42 AM   Specimen: Anterior Nasal Swab  Result Value Ref Range Status   SARS Coronavirus 2 by RT PCR NEGATIVE NEGATIVE Final   Influenza A by PCR NEGATIVE NEGATIVE Final   Influenza B by PCR NEGATIVE NEGATIVE Final    Comment: (NOTE) The Xpert Xpress SARS-CoV-2/FLU/RSV plus assay is intended as an aid in  the diagnosis of influenza from Nasopharyngeal swab specimens and should not be used as a sole basis for treatment. Nasal washings and aspirates are unacceptable for Xpert Xpress SARS-CoV-2/FLU/RSV testing.  Fact Sheet for Patients: BloggerCourse.com  Fact Sheet for Healthcare Providers: SeriousBroker.it  This test is not yet approved or cleared by the [place] FDA and has been authorized for detection and/or diagnosis of SARS-CoV-2 by FDA under an Emergency Use Authorization (EUA). This EUA will remain in effect (meaning this test can be used) for the duration of the COVID-19 declaration under Section 564(b)(1) of the Act, 21 U.S.C. section 360bbb-3(b)(1), unless the authorization is terminated or revoked.     Resp Syncytial Virus by PCR NEGATIVE NEGATIVE Final    Comment: (NOTE) Fact Sheet for Patients: BloggerCourse.com  Fact Sheet for Healthcare Providers:  SeriousBroker.it  This test is not yet approved or cleared by the [place] and has been authorized for detection and/or diagnosis of SARS-CoV-2 by FDA under an Emergency Use Authorization (EUA). This EUA will remain in effect (meaning this test can be used) for the duration of the COVID-19 declaration under Section 564(b)(1) of the Act, 21 U.S.C. section 360bbb-3(b)(1), unless the authorization is terminated or revoked.  Performed at [facility], [address], [place], [place] [address]       Radiology Studies: CT Angio Chest Pulmonary Embolism (PE) W or WO Contrast Result Date: 11/10/2023 CLINICAL DATA:  Pulmonary embolism EXAM: CT ANGIOGRAPHY CHEST WITH CONTRAST TECHNIQUE: Multidetector CT imaging of the chest was performed using the standard protocol during bolus administration of intravenous contrast. Multiplanar CT image reconstructions and MIPs were obtained to evaluate the vascular anatomy.  RADIATION DOSE REDUCTION: This exam was performed according to the departmental dose-optimization program which includes automated exposure control, adjustment of the mA and/or kV according to patient size and/or use of iterative reconstruction technique. CONTRAST:  75mL OMNIPAQUE IOHEXOL 350 MG/ML SOLN COMPARISON:  None Available. FINDINGS: Cardiovascular: Adequate opacification of the pulmonary arterial tree. No intraluminal filling defect identified to suggest acute pulmonary embolism. Central pulmonary arteries are of normal caliber. Extensive multi-vessel coronary artery calcification. Cardiac size within normal limits. No pericardial effusion. Moderate atherosclerotic calcification within the thoracic aorta. No aortic aneurysm. Mediastinum/Nodes: No enlarged mediastinal, hilar, or axillary lymph nodes. Thyroid gland, trachea, and esophagus demonstrate no significant findings. Lungs/Pleura: Small bilateral pleural effusions are present. There is diffuse but perihilar predominant ground-glass pulmonary infiltrates with cyst of the septal thickening most in keeping with interstitial and alveolar pulmonary edema. No pneumothorax. Thickening of the peribronchovascular interstitium in keeping with interstitial pulmonary edema. No central obstructing mass. Upper Abdomen: No acute abnormality. Reflux of contrast into the hepatic venous system in keeping with some degree of right heart failure. Musculoskeletal: Degenerative changes are seen within the thoracic spine flowing disc osteophytes throughout thoracic spine in keeping with changes of diffuse idiopathic skeletal hyperostosis. No acute bone abnormality. Review of the MIP images confirms the above findings. IMPRESSION: 1. No pulmonary embolism. 2. Extensive multi-vessel coronary artery calcification. 3. Small bilateral pleural effusions with diffuse but perihilar predominant ground-glass pulmonary infiltrates with cyst of the septal thickening most in keeping  with interstitial and alveolar pulmonary edema, likely cardiogenic in nature. Aortic Atherosclerosis (ICD10-I70.0). Electronically Signed   By: Helyn Numbers M.D.   On: 11/10/2023 04:17   DG Chest Port 1 View Result Date: 11/10/2023 CLINICAL DATA:  Dyspnea EXAM: PORTABLE CHEST 1 VIEW COMPARISON:  12/12/2017 FINDINGS: The lungs are symmetrically well expanded. Mild right basilar atelectasis or infiltrate. No pneumothorax or pleural effusion. Mild cardiomegaly. Central pulmonary vascular congestion without overt pulmonary edema. No acute bone abnormality. IMPRESSION: 1. Mild right basilar atelectasis or infiltrate. 2. Mild cardiomegaly. Electronically Signed   By: Helyn Numbers M.D.   On: 11/10/2023 02:59   VAS [place] LOWER EXTREMITY ARTERIAL DUPLEX Result Date: 11/08/2023 LOWER EXTREMITY ARTERIAL DUPLEX STUDY Patient Name:  Tina Hester  Date of Exam:   11/08/2023 Medical Rec #: [id number]           Accession #:    [id number] Date of Birth: Jan 23, 1941           Patient Gender: F Patient Age:   89 years Exam Location:  [facility] Procedure:      VAS [place] LOWER EXTREMITY ARTERIAL DUPLEX Referring  Phys: Heath Lark --------------------------------------------------------------------------------  Indications: Rest pain, and peripheral artery disease. High Risk Factors: Hypertension, hyperlipidemia, Diabetes, no history of                    smoking, coronary artery disease.  Current ABI: right= noncompressible/0.31, left=absent/absent Comparison Study: No prior study Performing Technologist: Gertie Fey MHA, RDMS, RVT, RDCS  Examination Guidelines: A complete evaluation includes B-mode imaging, spectral Doppler, color Doppler, and power Doppler as needed of all accessible portions of each vessel. Bilateral testing is considered an integral part of a complete examination. Limited examinations for reoccurring indications may be performed as noted.    +-----------+--------+-----+---------------+-------------------+--------+ LEFT       PSV cm/sRatioStenosis       Waveform           Comments +-----------+--------+-----+---------------+-------------------+--------+ CIA Mid    285          50-74% stenosismonophasic                  +-----------+--------+-----+---------------+-------------------+--------+ EIA Prox   66                          monophasic                  +-----------+--------+-----+---------------+-------------------+--------+ EIA Mid    340          50-74% stenosismonophasic                  +-----------+--------+-----+---------------+-------------------+--------+ EIA Distal 65                          monophasic                  +-----------+--------+-----+---------------+-------------------+--------+ CFA Distal 73                          monophasic                  +-----------+--------+-----+---------------+-------------------+--------+ DFA        61                          monophasic                  +-----------+--------+-----+---------------+-------------------+--------+ SFA Prox   15                          Dampened monophasic         +-----------+--------+-----+---------------+-------------------+--------+ SFA Mid    17                          dampened monophasic         +-----------+--------+-----+---------------+-------------------+--------+ SFA Distal 14                          dampened monophasic         +-----------+--------+-----+---------------+-------------------+--------+ POP Prox   46                          monophasic                  +-----------+--------+-----+---------------+-------------------+--------+ POP Distal 44  monophasic                  +-----------+--------+-----+---------------+-------------------+--------+ TP Trunk   26                          dampened monophasic          +-----------+--------+-----+---------------+-------------------+--------+ ATA Prox   20                          dampened monophasic         +-----------+--------+-----+---------------+-------------------+--------+ ATA Mid    23                          dampened monophasic         +-----------+--------+-----+---------------+-------------------+--------+ ATA Distal 23                          dampened monophasic         +-----------+--------+-----+---------------+-------------------+--------+ PTA Prox   24                          dampened monophasic         +-----------+--------+-----+---------------+-------------------+--------+ PTA Mid    30                          dampened monophasic         +-----------+--------+-----+---------------+-------------------+--------+ PTA Distal 26                          dampened monophasic         +-----------+--------+-----+---------------+-------------------+--------+ PERO Mid   24                          dampened monophasic         +-----------+--------+-----+---------------+-------------------+--------+ PERO Distal                            unable to insonate          +-----------+--------+-----+---------------+-------------------+--------+  Summary: Left: 50-74% stenosis noted in the common and external iliac arteries. Heavily calcified common and external iliac arteries. Dampened monophasic flow in lower extremity arterial system.  See table(s) above for measurements and observations. Electronically signed by Heath Lark on 11/08/2023 at 4:42:55 PM.    Final    VAS [place] ABI WITH/WO TBI Result Date: 11/08/2023  LOWER EXTREMITY DOPPLER STUDY Patient Name:  Tina Hester  Date of Exam:   11/08/2023 Medical Rec #: [id number]           Accession #:    [id number] Date of Birth: [birth year]           Patient Gender: F Patient Age:   1 years Exam Location:  [facility] Procedure:      VAS [place] ABI WITH/WO TBI Referring Phys:  Heath Lark --------------------------------------------------------------------------------  Indications: Rest pain, and peripheral artery disease. High Risk Factors: Hypertension, hyperlipidemia, Diabetes, no history of                    smoking.  Comparison Study: 08/30/2023 ABI/TBI- right=Noncompressible/0.62,                   left=0.26/absent Performing Technologist: Gertie Fey MHA, RVT, RDCS,  RDMS  Examination Guidelines: A complete evaluation includes at minimum, Doppler waveform signals and systolic blood pressure reading at the level of bilateral brachial, anterior tibial, and posterior tibial arteries, when vessel segments are accessible. Bilateral testing is considered an integral part of a complete examination. Photoelectric Plethysmograph (PPG) waveforms and toe systolic pressure readings are included as required and additional duplex testing as needed. Limited examinations for reoccurring indications may be performed as noted.  ABI Findings: +---------+------------------+-----+----------+--------+ Right    Rt Pressure (mmHg)IndexWaveform  Comment  +---------+------------------+-----+----------+--------+ Brachial 153                                       +---------+------------------+-----+----------+--------+ ATA      Noncompressible        monophasic         +---------+------------------+-----+----------+--------+ PTA      Noncompressible        monophasic         +---------+------------------+-----+----------+--------+ Great Toe51                0.31                    +---------+------------------+-----+----------+--------+ +---------+------------------+-----+------------------+-------+ Left     Lt Pressure (mmHg)IndexWaveform          Comment +---------+------------------+-----+------------------+-------+ Brachial 162                                              +---------+------------------+-----+------------------+-------+ ATA                              unable to insonate        +---------+------------------+-----+------------------+-------+ PTA                             unable to insonate        +---------+------------------+-----+------------------+-------+ Great Toe0                 0.00                           +---------+------------------+-----+------------------+-------+ +-------+---------------+-----------+---------------+------------+ ABI/TBIToday's ABI    Today's TBIPrevious ABI   Previous TBI +-------+---------------+-----------+---------------+------------+ Right  Noncompressible0.31       Noncompressible0.62         +-------+---------------+-----------+---------------+------------+ Left   Absent         Absent     0.26           Absent       +-------+---------------+-----------+---------------+------------+  Right ABIs appear essentially unchanged compared to prior study on 08/30/2023. Left ABIs appear decreased compared to prior study on 08/30/2023.  Summary: Right: Resting right ankle-brachial index indicates noncompressible right lower extremity arteries. The right toe-brachial index is abnormal. Left: Resting left ankle-brachial index indicates critical left limb ischemia. The left toe-brachial index is abnormal. *See table(s) above for measurements and observations.  Electronically signed by Heath Lark on 11/08/2023 at 4:42:39 PM.    Final        LOS: 0 days   Osvaldo Shipper  [facility] Pager on [email or web address]  11/10/2023, 8:30 AM

## 2023-11-10 NOTE — H&P (View-Only) (Signed)
 Patient Name: Tina Hester Date of Encounter: 11/10/2023 [facility] Cardiologist: Armanda Magic, MD   Interval Summary  .    Patient reports feeling well this AM. She reports that she is in the hospital because of a "bad leg". She has been having pain and a nonhealing wound on her left leg. She denies having any chest pain. Reports that her daughter believes that she is short of breath, but that she does not feel this way. Patient does have underlying dementia, believes that the year is 2024 and the month is February. She lives with her daughter. When daughter called EMS, she reported that the patient was having chest pain.   Vital Signs .    Vitals:   11/10/23 0400 11/10/23 0530 11/10/23 0729 11/10/23 0730  BP:    (!) 150/86  Pulse:    87  Resp:    18  Temp:  98.2 F (36.8 C) 97.7 F (36.5 C)   TempSrc:  Oral Temporal   SpO2:    90%  Weight: 85.7 kg     Height: 5' (1.524 m)       Intake/Output Summary (Last 24 hours) at 11/10/2023 0855 Last data filed at 11/10/2023 0645 Gross per 24 hour  Intake --  Output 700 ml  Net -700 ml      11/10/2023    4:00 AM 07/04/2023    2:55 PM 05/08/2023    7:49 AM  Last 3 Weights  Weight (lbs) 189 lb 189 lb 199 lb 15.3 oz  Weight (kg) 85.73 kg 85.73 kg 90.7 kg      Telemetry/ECG    Atrial fibrillation, HR in the 100s  - Personally Reviewed  Physical Exam .   GEN: No acute distress.  Sitting upright in the bed in no acute distress  Neck: No JVD Cardiac: Irregular rate and rhythm. no murmurs, rubs, or gallops. Radial pulses 2+ bilaterally  Respiratory: Clear to auscultation bilaterally. Normal WOB on room Air  GI: Soft, nontender, non-distended  MS: No edema in BLE   Assessment & Plan .     NSTEMI  Coronary Artery Disease  - Patient presented with chest discomfort, dyspnea. hsTn [id number] - Patient has a past medical history of PAD, HTN, HLD, type 2 DM. CTA chest this admission noted extensive multi-vessel coronary  artery calcification  - Patient denies chest pain, shortness of breath. Currently resting comfortably in the bed.  - Plan for LHC today for NSTEMI  - Echocardiogram has been ordered and is pending  - Continue IV heparin  - Patient was given ASA 324 mg in the ED. Continue ASA 81 mg daily moving forward  - Continue zetia 10 mg daily. Start crestor 10 mg daily and titrate as tolerated - Continue metoprolol, increasing dose for improved HR control   Peripheral Artery Disease  - Followed by vascular surgery. Was initially scheduled for lower extremity angiogram for chronic limb threatening ischemia and left lateral calf nonhealing ulcer on 3/14 (tomorrow)  - Now admitted with NSTEMI  - Continue IV heparin, ASA, zetia  - Vascular surgery has been consulted   Carotid Artery Disease  - Carotid ultrasound from 2020 showed 1-39% stenosis in bilateral ICAs  - Continue ASA, zetia  Persistent Atrial Fibrillation  - EKG in the ED showed atrial fibrillation, HR 111 BPM, LBBB (chronic) - Per telemetry, HR has been in the low 100s. BP elevated  - Increase metoprolol to 50 mg BID for HR and BP control  -  Patient had been holding eliquis at home in preparation for angiogram. Last dose 3/11 - Now on IV heparin as above   HLD  - Lipid panel this admission pending  - Patient was previously on lipitor, but developed leg cramps. Lipitor was held in 07/2023 - With NSTEMI and PAD, would recommend resuming statin therapy. Start crestor 10 mg daily for now. Titrate as tolerated    For questions or updates, please contact [facility] Please consult [email or web address] for contact info under        Signed, Jonita Albee, PA-C

## 2023-11-10 NOTE — Progress Notes (Signed)
 Patient's son Dhruvi Crenshaw ([phone] )is requesting cardiology/ attending  to call him in the morning

## 2023-11-10 NOTE — H&P (Addendum)
 History and Physical    Patient: Tina Hester WGN:[id number] DOB: 10/13/1940 DOA: 11/10/2023 DOS: the patient was seen and examined on 11/10/2023 PCP: Tina Palmer, MD  Patient coming from: Home via EMS  Chief Complaint:  Chief Complaint  Patient presents with   Shortness of Breath   HPI: Tina Hester is a 83 y.o. female with medical history significant of hypertension, hyperlipidemia, persistent atrial fibrillation, peripheral vascular disease, diabetes mellitus type 2, hypothyroidism, dementia, and GERD who presents with complaints of chest pain and shortness of breath.  History is obtained from the patient and from review of records as she does not seem to know exactly why she was sent to the hospital at this time.  It was reported that the patient had been complaining of shortness of breath and mild chest pain while she was sleeping.  She denied having any significant fever, cough, nausea, vomiting, diarrhea, or dysuria symptoms.  At this time patient denies any complaints of chest pain.  She makes note that she is having discomfort in her left leg from ulcer she has on the lateral aspect.  She has been off Eliquis for the last 2 days at least in preparation for a angiogram with vascular surgery scheduled on 3/14.  En route with EMS patient's O2 saturations were noted to be 89% on room air for which she was placed on 2 L of nasal cannula oxygen.  Upon admission into the emergency department patient was noted to be afebrile with heart rates elevated up to 111 and atrial fibrillation, blood pressures elevated up to 152/80, and oxygen saturations currently maintained on 2 L of nasal cannula oxygen.   Labs significant for high-sensitivity troponin 466-> 1633, BNP 546.3, BUN 21, creatinine 1.12, and glucose 232.  Chest x-ray noted mild right basilar atelectasis or infiltrate with mild cardiomegaly.  Influenza, COVID-19, and RSV screening were negative.  CT angiogram of the chest was  obtained which noted no pulmonary embolism with extensive multivessel coronary artery calcification, small bilateral pleural effusions with concern for pulmonary edema. Patient had been given full dose aspirin, Lasix 40 mg IV, and started on a heparin drip.  Review of Systems: As mentioned in the history of present illness. All other systems reviewed and are negative. Past Medical History:  Diagnosis Date   Anemia    s/p Heme work -up normal EGD and colonscopy in 2012 per pt,neg SPEP   Anxiety    Aortic stenosis    Mild by echo 12/2021 with mean aortic valve gradient 17 mmHg   Bilateral carotid artery stenosis 06/08/2015   1-39% bilateral   Bradycardia    CKD (chronic kidney disease), stage III (HCC)    Diabetes mellitus without complication (HCC)    type 2   GERD (gastroesophageal reflux disease)    Gout    HSV-1 (herpes simplex virus 1) infection    Acyclovir prn   Hyperkalemia    Hyperlipidemia    Hypertension    Joint pain    osteoarthritis by Xray- possible erosion of R 4th MCP,elevated uric  acid ,ANA +    MCI (mild cognitive impairment)    Morbid obesity (HCC)    OSA on CPAP    Osteopenia    PAF (paroxysmal atrial fibrillation) (HCC)    Pain management    Neurosurg: Dr Odette Fraction   Pulmonary HTN (HCC)    Moderate by echo  2016. PASP   TIA (transient ischemic attack)    remote history of TIA's  early 2000   Past Surgical History:  Procedure Laterality Date   ABDOMINAL HYSTERECTOMY     APPENDECTOMY     BACK SURGERY     cervical fusion   BLADDER SURGERY     CESAREAN SECTION     CHOLECYSTECTOMY     LOWER EXTREMITY ANGIOGRAPHY N/A 02/25/2023   Procedure: Lower Extremity Angiography;  Surgeon: Leonie Douglas, MD;  Location: [facility] INVASIVE CV LAB;  Service: Cardiovascular;  Laterality: N/A;   PERIPHERAL VASCULAR INTERVENTION  02/25/2023   Procedure: PERIPHERAL VASCULAR INTERVENTION;  Surgeon: Leonie Douglas, MD;  Location: [facility] INVASIVE CV LAB;  Service:  Cardiovascular;;   Social History:  reports that she has never smoked. She has never used smokeless tobacco. She reports that she does not drink alcohol and does not use drugs.  Allergies  Allergen Reactions   Pneumococcal Vaccine Anaphylaxis    weakness   Pneumovax 23 [Pneumococcal Vac Polyvalent] Anaphylaxis    weakness   Other Hives    Other reaction(s): Other (See Comments) Pneumonia  Vaccine- very ill   Sulfa Antibiotics Hives   Pregabalin     Other reaction(s): depression    Family History  Problem Relation Age of Onset   Arrhythmia Brother     Prior to Admission medications   Medication Sig Start Date End Date Taking? Authorizing Provider  acetaminophen (TYLENOL) 325 MG tablet Take 650 mg by mouth every 4 (four) hours as needed for mild pain (pain score 1-3) or moderate pain (pain score 4-6).    [provider]  ALPRAZolam Prudy Feeler) 0.25 MG tablet Take 1 tablet (0.25 mg total) by mouth at bedtime as needed for anxiety. 01/03/23   Sherryll Burger, Pratik D, DO  apixaban (ELIQUIS) 5 MG TABS tablet Take 1 tablet (5 mg total) by mouth 2 (two) times daily. 07/01/23   Quintella Reichert, MD  donepezil (ARICEPT) 5 MG tablet Take 5 mg by mouth at bedtime. 12/17/22   [provider]  empagliflozin (JARDIANCE) 10 MG TABS tablet Take 10 mg by mouth daily.    [provider]  ezetimibe (ZETIA) 10 MG tablet Take 10 mg by mouth every evening. 12/17/22   [provider]  gabapentin (NEURONTIN) 300 MG capsule Take 600 mg by mouth at bedtime.    [provider]  HYDROcodone-acetaminophen (NORCO/VICODIN) 5-325 MG tablet Take 1 tablet by mouth every 6 (six) hours as needed for moderate pain (pain score 4-6). 11/08/23   Leonie Douglas, MD  insulin lispro (HUMALOG) 100 UNIT/ML injection Inject 1-7 Units into the skin 3 (three) times daily before meals. Sliding scale 141-180=1 unit,181-220= 2 units,221-260= 3 units,261-300=4 units, 301-340=5 units, 341-380=6 units, 381-400  = 7 units,> 400 call MD    [provider]  Insulin NPH, Human,, Isophane, (NOVOLIN N FLEXPEN) 100 UNIT/ML Kiwkpen Inject 15 units at morning and 18 units at night Patient taking differently: Inject 14-18 Units into the skin See admin instructions. Inject 14 units at morning and 18 units at night 09/24/21   Reather Littler, MD  levothyroxine (SYNTHROID) 150 MCG tablet Take 150 mcg by mouth every morning. 04/13/23   [provider]  metoprolol tartrate (LOPRESSOR) 50 MG tablet Take 1 tablet (50 mg total) by mouth 2 (two) times daily. Patient taking differently: Take 25 mg by mouth 2 (two) times daily. 05/07/22   Quintella Reichert, MD  olmesartan (BENICAR) 20 MG tablet Take 20 mg by mouth every evening. 12/17/22   [provider]  omeprazole (PRILOSEC) 20  MG capsule Take 20 mg by mouth daily as needed (acid reflux). 04/13/23   [provider]  OZEMPIC, 0.25 OR 0.5 MG/DOSE, 2 MG/3ML SOPN Inject 0.25 mg into the skin once a week. Sundays 03/14/23   [provider]  sertraline (ZOLOFT) 50 MG tablet Take 50 mg by mouth daily. 12/17/22   [provider]    Physical Exam: Vitals:   11/10/23 0132 11/10/23 0230 11/10/23 0300 11/10/23 0400  BP: (!) 148/97 (!) 138/94 (!) 152/80   Pulse: (!) 111 (!) 59 83   Resp: 20 18 19    Temp: 98 F (36.7 C)     TempSrc: Oral     SpO2: 94% 97% 98%   Weight:    85.7 kg  Height:    5' (1.524 m)   Constitutional: Elderly female currently NAD, calm, comfortable Eyes:Opacification of the right eye. Lid and conjunctivae normal in left eye ENMT: Mucous membranes are moist.  .Normal dentition.  Neck: normal, supple  Respiratory: Intermittent crackles appreciated in the mid to lower lung fields.  O2 saturation currently maintained on 2 L nasal cannula oxygen. Cardiovascular: Irregular, irregular.  No murmurs / rubs / gallops. No extremity edema.  Decreased lower extremity pulses. Abdomen: no tenderness, no masses palpated.   Bowel  sounds positive.  Musculoskeletal: no clubbing / cyanosis. No joint deformity upper and lower extremities. Good ROM, no contractures. Normal muscle tone.  Skin: Ulcer noted of the left lateral aspect of the calf as seen below Neurologic: CN 2-12 grossly intact. Sensation intact, DTR normal. Strength 5/5 in all 4.  Psychiatric: Normal judgment and insight. Alert and oriented x 3. Normal mood.   Data Reviewed:  EKG revealed atrial fibrillation at 111 bpm with left bundle branch block..  Reviewed labs, imaging, and pertinent records as documented.  Assessment and Plan:   NSTEMI Acute.  Patient presented with complaints of some chest discomfort and shortness of breath.  EKG revealed atrial fibrillation with left bundle branch block high-sensitivity troponin 466-> 1633.  Question if symptoms secondary to demand in the setting of CHF. -Admit to a cardiac telemetry bed -Continue heparin drip per pharmacy -Check echocardiogram -Appreciate cardiology consultative services we will follow-up for any further recommendations.  Acute respiratory failure with hypoxia Pulmonary edema Heart failure with preserved ejection fraction Acute on chronic.  Patient had initially reported complaints of shortness of breath.  Noted to have O2 saturations as low as 89% on room air on arrival of EMS.  Currently patient not on oxygen at baseline.  O2 saturations currently maintained on 2 L nasal cannula oxygen.  CT imaging concerning for small bilateral pleural effusions with signs of pulmonary edema, and no pulmonary embolism.  Patient had been given Lasix 40 mg IV x 1 dose. -Continuous pulse oximetry with nasal cannula oxygen maintain O2 saturation greater than 92% -Strict I&O's and daily weights -Check echocardiogram -Lasix 40 mg IV twice daily -Cardiology consulted,  will follow-up for any further recommendations  Left leg ulcer Peripheral vascular disease Patient has a left lateral calf ulcer for which plans  for a left lower extremity angiogram or planned by vascular surgery on 11/11/2023.  She had been off of the Eliquis for last 2 days in preparation.  It was noticed some surrounding erythema around the wound. -Check CRP -Continue aspirin, Zetia, and hydrocodone as needed for pain -Consider need of antibiotics -Will need to touch base with vascular surgery in a.m.  Permanent atrial fibrillation on chronic anticoagulation Patient noted to be  in atrial fibrillation but relatively rate controlled.  She had been holding Eliquis due to upcoming angiogram. CHA2DS2-VASc score 3 5. -Continue beta-blocker and heparin  Uncontrolled diabetes mellitus type 2, with long-term use of insulin On admission glucose elevated up to 232.  Last available hemoglobin A1c was 8 when checked 05/2023. -Hypoglycemic protocols -Add on hemoglobin A1c -Continue Jardiance and home NPH regimen -CBGs before every meal with sensitive SSI  Essential hypertension -Continue home blood pressure regimen  Chronic kidney disease stage IIIb On admission creatinine noted to be 1.12 with BUN 21.  Baseline creatinine noted to be around 1.3. -Continue to monitor kidney function with diuresis  Mixed hyperlipidemia Patient currently not on statin medication. -Check lipid panel -Continue Zetia    Dementia -Delirium precautions -Continue donepezil  Hypothyroidism -Add on TSH -Continue levothyroxine  Obesity BMI 36.91 kg/m.  Patient appears to be on Ozempic  DVT prophylaxis: Lovenox Advance Care Planning:   Code Status: Full Code   Consults: Cardiology  Family Communication: None  Severity of Illness: The appropriate patient status for this patient is INPATIENT. Inpatient status is judged to be reasonable and necessary in order to provide the required intensity of service to ensure the patient's safety. The patient's presenting symptoms, physical exam findings, and initial radiographic and laboratory data in the context of  their chronic comorbidities is felt to place them at high risk for further clinical deterioration. Furthermore, it is not anticipated that the patient will be medically stable for discharge from the hospital within 2 midnights of admission.   * I certify that at the point of admission it is my clinical judgment that the patient will require inpatient hospital care spanning beyond 2 midnights from the point of admission due to high intensity of service, high risk for further deterioration and high frequency of surveillance required.*  Author: Clydie Braun, MD 11/10/2023 4:56 AM  For on call review [email or web address].

## 2023-11-10 NOTE — TOC Initial Note (Signed)
 Transition of Care [facility]) - Initial/Assessment Note    Patient Details  Name: Tina Hester MRN: [id number] Date of Birth: [birth year]  [facility] of [facility]) CM/SW Contact:    Lucretia Field, LCSW Phone Number: 11/10/2023, 2:37 PM  Clinical Narrative:                 Patient lives with daughter, she fell 2x last week she stated. Patient uses a walker, wheelchair at home, patient has HH with [facility], has a private pay nurse to help with ADL's 3x/week for 3 hours, daughter also assist when nurse doesn't come. Patient is able to get up and down too/from bathroom. Patient has borderline HBP, type 2 diabetic (uses metformin), patient has great support from her 3 sons, 1 daughter and grandchildren. Her daughter transport her to her appts. Patient had a mini stroke in 2020, hx of dementia.   TOC will continue to follow for any additional needs.    Patient Goals and CMS Choice            Expected Discharge Plan and Services                                              Prior Living Arrangements/Services                       Activities of Daily Living   ADL Screening (condition at time of admission) Independently performs ADLs?: No Does the patient have a NEW difficulty with bathing/dressing/toileting/self-feeding that is expected to last >3 days?: No Does the patient have a NEW difficulty with getting in/out of bed, walking, or climbing stairs that is expected to last >3 days?: No Does the patient have a NEW difficulty with communication that is expected to last >3 days?: No Is the patient deaf or have difficulty hearing?: No Does the patient have difficulty seeing, even when wearing glasses/contacts?: Yes Does the patient have difficulty concentrating, remembering, or making decisions?: Yes  Permission Sought/Granted                  Emotional Assessment              Admission diagnosis:  Acute exacerbation of CHF (congestive heart failure)  (HCC) [I50.9] Patient Active Problem List   Diagnosis Date Noted   Heart failure with preserved ejection fraction (HCC) 11/10/2023   NSTEMI (non-ST elevated myocardial infarction) (HCC) 11/10/2023   PVD (peripheral vascular disease) (HCC) 11/10/2023   Acute respiratory failure with hypoxia (HCC) 11/10/2023   Chronic ulcer of left leg (HCC) 11/10/2023   Pain of left hand 07/19/2023   Pressure ulcer of right heel, stage 3 (HCC) 05/26/2023   History of falling 05/09/2023   Hypertensive heart and chronic kidney disease with heart failure and stage 1 through stage 4 chronic kidney disease, or unspecified chronic kidney disease (HCC) 05/09/2023   Long term (current) use of insulin (HCC) 05/09/2023   Pain in right ankle and joints of right foot 05/09/2023   Personal history of transient ischemic attack (TIA), and cerebral infarction without residual deficits 05/09/2023   Type 2 diabetes mellitus with diabetic chronic kidney disease (HCC) 05/09/2023   Anemia, unspecified 01/18/2023   Left leg swelling 12/31/2022   Leukocytosis 12/31/2022   Hyponatremia 12/31/2022   Type 2 diabetes mellitus with hyperglycemia (HCC) 12/31/2022   GERD (gastroesophageal  reflux disease) 12/31/2022   Dementia without behavioral disturbance (HCC) 12/31/2022   Open wound of right heel 12/30/2022   Abdominal aortic atherosclerosis (HCC) 11/05/2020   Acquired thrombophilia (HCC) 11/05/2020   Lumbar radiculopathy 11/05/2020   Hematoma of lower leg 12/18/2018   Low back pain 12/18/2018   Contusion of left knee 06/16/2018   Pain in left knee 06/16/2018   Rib pain 06/16/2018   Glaucoma suspect of left eye 12/22/2017   Secondary open-angle glaucoma of right eye, indeterminate stage 12/22/2017   Postural dizziness with presyncope 12/13/2017   Chest pain 12/13/2017   Hyperkalemia 12/13/2017   Acute kidney injury (HCC) 12/13/2017   Fluid overload 12/13/2017   Bradycardia 12/13/2017   CHF (congestive heart failure)  (HCC) 12/13/2017   Aortic stenosis 11/02/2016   Aortic valve disorder 11/02/2016   Abrasion of right cornea 04/12/2016   Status post corneal transplant 04/12/2016   Combined forms of age-related cataract of left eye 04/12/2016   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   Pulmonary HTN (HCC) 12/15/2015   Bilateral carotid artery stenosis 06/08/2015   Essential hypertension    OSA on CPAP    Persistent atrial fibrillation (HCC)    Obesity (BMI 30-39.9) 09/04/2013   Gout 09/04/2013   DM2 (diabetes mellitus, type 2) (HCC) 05/29/2013   Mixed hyperlipidemia 05/29/2013   Anxiety 01/31/2012   Chronic kidney disease, stage 3b (HCC) 01/31/2012   Diabetes mellitus type 2 with complications, uncontrolled 01/31/2012   Acquired hypothyroidism 01/31/2012   TIA (transient ischemic attack) 01/31/2012   H/O difficult intubation 01/31/2012   Lupus 01/31/2012   CAD (coronary artery disease), native coronary artery 01/26/2012   PCP:  [name], MD Pharmacy:   [facility] [phone] - [place], [place] - [address] [facility] [address] [place] [address] Phone: [phone] Fax: [phone]     Social Drivers of Health (SDOH) Social History: SDOH Screenings   Food Insecurity: No Food Insecurity (11/10/2023)  Housing: Low Risk  (11/10/2023)  Transportation Needs: No Transportation Needs (11/10/2023)  Utilities: Not At Risk (11/10/2023)  Depression (PHQ2-9): Low Risk  (06/23/2020)  Social Connections: Unknown (11/10/2023)  Tobacco Use: Low Risk  (11/10/2023)   SDOH Interventions:     Readmission Risk Interventions     No data to display         Lily Peer, MSW, LCSWA Transition of Care  Clinical Social Worker (ED 3-11 Mon-Fri)  [phone]

## 2023-11-10 NOTE — Progress Notes (Addendum)
 [facility]     [id number] Dorris Carnes Szatkowski is a 83 y.o. female who presented to Dr Verita Lamb clinic  on 11/08/23 for evaluation of right heel unstageable ulceration, left lateral malleolus ulcer and now with a lateral calf wound.   that are painful.  She has a history of right femoropopliteal angioplasty and stenting 02/25/23.    Plan aortogram, bilateral lower extremity angiogram with possible intervention on right lower extremity for chronic limb threatening ischemia.    She presented to the ED with SOB work up started for possible MI.  Labs significant for high-sensitivity troponin 466-> 1633   Past medical history includes:  hypertension, hyperlipidemia, persistent atrial fibrillation, peripheral vascular disease, diabetes mellitus type 2, hypothyroidism, dementia, and GERD     Subjective  - Pain in the left lateral calf wound area.   Objective (!) 150/86 87 97.7 F (36.5 C) (Temporal) 18 90%  Intake/Output Summary (Last 24 hours) at 11/10/2023 0981 Last data filed at 11/10/2023 0645 Gross per 24 hour  Intake --  Output 700 ml  Net -700 ml           Small left lateral dry wound, malleolus has healed laterally Lateral calf wound dry gangrene Foot is warm, no edema, no cellulitis Heart irregularly irregular Lungs non labored breathing General no acute distress   Previous study from 3/11/office visit +-------+-----------+-----------+------------+------------+  ABI/TBIToday's ABIToday's TBIPrevious ABIPrevious TBI  +-------+-----------+-----------+------------+------------+  Right [place]         0.62       [place]          0.47          +-------+-----------+-----------+------------+------------+  Left  0.26       absent     0.51        0.15          +-------+-----------+-----------+------------+------------+    Assessment/Planning: 83 y/o female pending further work up for possible MI with elevated Troponin's.  We will  reschedule her angiogram until we she is stable from a cardiac position.   Full dose Heparin has been initiated, ASA, Crestor, metoprolol for rate control, and pain control.    Most recent Troponin 1,633, CRP elevated 1.9  UA positive Plan for [facility] today for NSTEMI   Mosetta Pigeon 11/10/2023 9:37 AM --  Laboratory Lab Results: Recent Labs    11/10/23 0138  WBC 10.5  HGB 12.0  HCT 38.7  PLT 181   BMET Recent Labs    11/10/23 0138  NA 137  K 4.4  CL 106  CO2 20*  GLUCOSE 232*  BUN 21  CREATININE 1.12*  CALCIUM 8.9    COAG No results found for: "INR", "PROTIME" No results found for: "PTT"   I have seen and evaluated the patient. I agree with the PA note as documented above.  83 year old female scheduled for lower extremity angiogram tomorrow for new left lateral calf ulcer with history of right lower extremity intervention.  Now in the ED with workup for chest pain with elevated troponins and cardiology evaluation with concern for NSTEMI.  Discussed likely will delay her lower extremity angiogram tomorrow until she has been optimized by cardiology.  Will let Dr. Lenell Antu know of her admission.  Will keep her on the schedule for tomorrow pending final decision from cardiology.  Wounds are all dry and this is a chronic CLI clinical presentation from our standpoint.  Cephus Shelling, MD [facility] Office: [phone]

## 2023-11-10 NOTE — ED Notes (Signed)
 1st lac 1.85 in normal range 2nd not needed

## 2023-11-10 NOTE — Consult Note (Signed)
 Cardiology Consultation   Patient ID: Tina Hester MRN: [id number]; DOB: 1941-06-04  Admit date: 11/10/2023 Date of Consult: 11/10/2023  PCP:  Tina Palmer, MD   [facility] Providers Cardiologist:  Tina Magic, MD  Electrophysiologist:  Tina Lemming, MD       Patient Profile:   Tina Hester is a 83 y.o. female with a hx of PAD, carotid artery disease, persistent atrial fibrillation, hypertension, OSA, morbid obesity and dementia who is being seen 11/10/2023 for the evaluation of chest pain at the request of Dr. Blinda Hester.  History of Present Illness:   Tina Hester presents to the emergency room today for chest pain and shortness of breath.  She arrived via EMS.  On arrival, she states that she was not having any chest discomfort or dyspnea and was unsure why her daughter called EMS.  Per ED team, when her daughter was called she stated that her mother was experiencing chest discomfort when trying to sleep as well as symptoms consistent with orthopnea.  Both of these symptoms were new for her.  The patient declines any current symptoms including chest symptoms, dyspnea, abdominal discomfort, nausea, fever, chills.  She is oriented to person, place and time.  In terms of recent medical history, she underwent right femoral-popliteal angioplasty with stent 02/25/2023.  Hypertension has been controlled with hydrochlorothiazide 25 mg daily, olmesartan 20 mg daily and Lopressor 25 mg twice daily.  She has been followed by vascular surgery for a new ischemic ulceration of the left lateral calf.  She had plans for aortogram, bilateral lower extremity angiogram with possible intervention due to concern for chronic limb threatening ischemia.  She takes apixaban 5 mg twice daily and aspirin 81 mg daily  On arrival to the emergency room, afebrile, heart rate 80-1 110s, SBP 130-150s, not requiring any supplemental oxygen.  CBC unremarkable.  BMP with no electrolyte  derangements and creatinine of 1.12.  High-sensitivity troponin 466->1633.  EKG with atrial fibrillation with left bundle branch block, negative Sgarbossa criteria.  BNP 546.  Lactate normal.  CT PE without evidence of pulmonary embolism, there was extensive multivessel coronary artery calcifications noted as well as small bilateral pleural effusions with diffuse but perihilar predominant groundglass pulmonary infiltrates with septal thickening consistent with pulmonary edema.  Given IV 40 mg of Lasix and 324 mg of p.o. aspirin.  She was started on a heparin drip.  Past Medical History:  Diagnosis Date   Anemia    s/p Heme work -up normal EGD and colonscopy in 2012 per pt,neg SPEP   Anxiety    Aortic stenosis    Mild by echo 12/2021 with mean aortic valve gradient 17 mmHg   Bilateral carotid artery stenosis 06/08/2015   1-39% bilateral   Bradycardia    CKD (chronic kidney disease), stage III (HCC)    Diabetes mellitus without complication (HCC)    type 2   GERD (gastroesophageal reflux disease)    Gout    HSV-1 (herpes simplex virus 1) infection    Acyclovir prn   Hyperkalemia    Hyperlipidemia    Hypertension    Joint pain    osteoarthritis by Xray- possible erosion of R 4th MCP,elevated uric  acid ,ANA +    MCI (mild cognitive impairment)    Morbid obesity (HCC)    OSA on CPAP    Osteopenia    PAF (paroxysmal atrial fibrillation) (HCC)    Pain management    Neurosurg: Dr Tina Hester  Pulmonary HTN (HCC)    Moderate by echo  2016. PASP   TIA (transient ischemic attack)    remote history of TIA's early 2000    Past Surgical History:  Procedure Laterality Date   ABDOMINAL HYSTERECTOMY     APPENDECTOMY     BACK SURGERY     cervical fusion   BLADDER SURGERY     CESAREAN SECTION     CHOLECYSTECTOMY     LOWER EXTREMITY ANGIOGRAPHY N/A 02/25/2023   Procedure: Lower Extremity Angiography;  Surgeon: Tina Douglas, MD;  Location: [facility] INVASIVE CV LAB;  Service:  Cardiovascular;  Laterality: N/A;   PERIPHERAL VASCULAR INTERVENTION  02/25/2023   Procedure: PERIPHERAL VASCULAR INTERVENTION;  Surgeon: Tina Douglas, MD;  Location: [facility] INVASIVE CV LAB;  Service: Cardiovascular;;     Home Medications:  Prior to Admission medications   Medication Sig Start Date End Date Taking? Authorizing Provider  acetaminophen (TYLENOL) 325 MG tablet Take 650 mg by mouth every 4 (four) hours as needed for mild pain (pain score 1-3) or moderate pain (pain score 4-6).    [provider]  ALPRAZolam Prudy Feeler) 0.25 MG tablet Take 1 tablet (0.25 mg total) by mouth at bedtime as needed for anxiety. 01/03/23   Tina Hester, Tina Hester  apixaban (ELIQUIS) 5 MG TABS tablet Take 1 tablet (5 mg total) by mouth 2 (two) times daily. 07/01/23   Tina Reichert, MD  donepezil (ARICEPT) 5 MG tablet Take 5 mg by mouth at bedtime. 12/17/22   [provider]  empagliflozin (JARDIANCE) 10 MG TABS tablet Take 10 mg by mouth daily.    [provider]  ezetimibe (ZETIA) 10 MG tablet Take 10 mg by mouth every evening. 12/17/22   [provider]  gabapentin (NEURONTIN) 300 MG capsule Take 600 mg by mouth at bedtime.    [provider]  HYDROcodone-acetaminophen (NORCO/VICODIN) 5-325 MG tablet Take 1 tablet by mouth every 6 (six) hours as needed for moderate pain (pain score 4-6). 11/08/23   Tina Douglas, MD  insulin lispro (HUMALOG) 100 UNIT/ML injection Inject 1-7 Units into the skin 3 (three) times daily before meals. Sliding scale 141-180=1 unit,181-220= 2 units,221-260= 3 units,261-300=4 units, 301-340=5 units, 341-380=6 units, 381-400 = 7 units,> 400 call MD    [provider]  Insulin NPH, Human,, Isophane, (NOVOLIN N FLEXPEN) 100 UNIT/ML Kiwkpen Inject 15 units at morning and 18 units at night Patient taking differently: Inject 14-18 Units into the skin See admin instructions. Inject 14 units at morning and 18 units at night 09/24/21   Tina Littler, MD   levothyroxine (SYNTHROID) 150 MCG tablet Take 150 mcg by mouth every morning. 04/13/23   [provider]  metoprolol tartrate (LOPRESSOR) 50 MG tablet Take 1 tablet (50 mg total) by mouth 2 (two) times daily. Patient taking differently: Take 25 mg by mouth 2 (two) times daily. 05/07/22   Tina Reichert, MD  olmesartan (BENICAR) 20 MG tablet Take 20 mg by mouth every evening. 12/17/22   [provider]  omeprazole (PRILOSEC) 20 MG capsule Take 20 mg by mouth daily as needed (acid reflux). 04/13/23   [provider]  OZEMPIC, 0.25 OR 0.5 MG/DOSE, 2 MG/3ML SOPN Inject 0.25 mg into the skin once a week. Sundays 03/14/23   [provider]  sertraline (ZOLOFT) 50 MG tablet Take 50 mg by mouth daily. 12/17/22   [provider]    Inpatient Medications: Scheduled Meds:  Continuous Infusions:  heparin  1,200 Units/hr (11/10/23 0422)   PRN Meds:   Allergies:    Allergies  Allergen Reactions   Pneumococcal Vaccine Anaphylaxis    weakness   Pneumovax 23 [Pneumococcal Vac Polyvalent] Anaphylaxis    weakness   Other Hives    Other reaction(s): Other (See Comments) Pneumonia  Vaccine- very ill   Sulfa Antibiotics Hives   Pregabalin     Other reaction(s): depression    Social History:   Social History   Socioeconomic History   Marital status: Divorced    Spouse name: Not on file   Number of children: 4   Years of education: Not on file   Highest education level: Some college, no degree  Occupational History   Not on file  Tobacco Use   Smoking status: Never   Smokeless tobacco: Never  Vaping Use   Vaping status: Never Used  Substance and Sexual Activity   Alcohol use: No   Drug use: No   Sexual activity: Not on file  Other Topics Concern   Not on file  Social History Narrative   10/27/21 lives with daughter, [name]   Some soda   Social Drivers of Health   Financial Resource Strain: Not on file  Food Insecurity: No Food Insecurity  (12/30/2022)   Hunger Vital Sign    Worried About Running Out of Food in the Last Year: Never true    Ran Out of Food in the Last Year: Never true  Transportation Needs: No Transportation Needs (12/30/2022)   PRAPARE - Administrator, Civil Service (Medical): No    Lack of Transportation (Non-Medical): No  Physical Activity: Not on file  Stress: Not on file  Social Connections: Unknown (06/07/2023)   [facility]   Social Network    Social Network: Not on file  Intimate Partner Violence: Not At Risk (06/07/2023)   Received from [facility]   HITS    Over the last 12 months how often did your partner physically hurt you?: Never    Over the last 12 months how often did your partner insult you or talk down to you?: Never    Over the last 12 months how often did your partner threaten you with physical harm?: Never    Over the last 12 months how often did your partner scream or curse at you?: Never    Family History:    Family History  Problem Relation Age of Onset   Arrhythmia Brother      ROS:  Please see the history of present illness.   All other ROS reviewed and negative.     Physical Exam/Data:   Vitals:   11/10/23 0132 11/10/23 0230 11/10/23 0300 11/10/23 0400  BP: (!) 148/97 (!) 138/94 (!) 152/80   Pulse: (!) 111 (!) 59 83   Resp: 20 18 19    Temp: 98 F (36.7 C)     TempSrc: Oral     SpO2: 94% 97% 98%   Weight:    85.7 kg  Height:    5' (1.524 m)   No intake or output data in the 24 hours ending 11/10/23 0510    11/10/2023    4:00 AM 07/04/2023    2:55 PM 05/08/2023    7:49 AM  Last 3 Weights  Weight (lbs) 189 lb 189 lb 199 lb 15.3 oz  Weight (kg) 85.73 kg 85.73 kg 90.7 kg     Body mass index is 36.91 kg/m.  General: No acute distress  HEENT: Dentures in place Neck: no JVD Cardiac: Tachycardic and irregularly irregular, no murmur Lungs: Crackles at the bases, no increased work of breath at rest Abd: soft, nontender, no hepatomegaly   Ext: no edema Musculoskeletal:  No deformities, BUE and BLE strength normal and equal Skin: Left lateral calf wound Neuro:  CNs 2-12 intact, no focal abnormalities noted Psych:  Normal affect   EKG:  The EKG was personally reviewed and demonstrates: Atrial fibrillation with ventricular rates in the 110s, left bundle branch block with negative Sgarbossa criteria Telemetry:  Telemetry was personally reviewed and demonstrates: Atrial fibrillation  Relevant CV Studies: 12/2021 TTE 1. Left ventricular ejection Hester, by estimation, is 55 to 60%. The  left ventricle has normal function. The left ventricle has no regional  wall motion abnormalities. There is mild left ventricular hypertrophy.  Left ventricular diastolic function  could not be evaluated.   2. Right ventricular systolic function is normal. The right ventricular  size is normal.   3. The mitral valve is normal in structure. Mild mitral valve  regurgitation. No evidence of mitral stenosis.   4. The aortic valve is tricuspid. Aortic valve regurgitation is mild.  Mild aortic valve stenosis.   5. The inferior vena cava is normal in size with greater than 50%  respiratory variability, suggesting right atrial pressure of 3 mmHg.   Comparison(s): 07/12/19 EF 60-65%. Mild AS mean PG, peak PG.   Laboratory Data:  High Sensitivity Troponin:   Recent Labs  Lab 11/10/23 0138 11/10/23 0325  TROPONINIHS 466* 1,633*     Chemistry Recent Labs  Lab 11/10/23 0138  NA 137  K 4.4  CL 106  CO2 20*  GLUCOSE 232*  BUN 21  CREATININE 1.12*  CALCIUM 8.9  GFRNONAA 49*  ANIONGAP 11    No results for input(s): "PROT", "ALBUMIN", "AST", "ALT", "ALKPHOS", "BILITOT" in the last 168 hours. Lipids No results for input(s): "CHOL", "TRIG", "HDL", "LABVLDL", "LDLCALC", "CHOLHDL" in the last 168 hours.  Hematology Recent Labs  Lab 11/10/23 0138  WBC 10.5  RBC 4.37  HGB 12.0  HCT 38.7  MCV 88.6  MCH 27.5  MCHC 31.0   RDW 16.0*  PLT 181   Thyroid No results for input(s): "TSH", "FREET4" in the last 168 hours.  BNP Recent Labs  Lab 11/10/23 0138  BNP 546.3*    DDimer No results for input(s): "DDIMER" in the last 168 hours.   Radiology/Studies:  CT Angio Chest Pulmonary Embolism (PE) W or WO Contrast Result Date: 11/10/2023 CLINICAL DATA:  Pulmonary embolism EXAM: CT ANGIOGRAPHY CHEST WITH CONTRAST TECHNIQUE: Multidetector CT imaging of the chest was performed using the standard protocol during bolus administration of intravenous contrast. Multiplanar CT image reconstructions and MIPs were obtained to evaluate the vascular anatomy. RADIATION DOSE REDUCTION: This exam was performed according to the departmental dose-optimization program which includes automated exposure control, adjustment of the mA and/or kV according to patient size and/or use of iterative reconstruction technique. CONTRAST:  75mL OMNIPAQUE IOHEXOL 350 MG/ML SOLN COMPARISON:  None Available. FINDINGS: Cardiovascular: Adequate opacification of the pulmonary arterial tree. No intraluminal filling defect identified to suggest acute pulmonary embolism. Central pulmonary arteries are of normal caliber. Extensive multi-vessel coronary artery calcification. Cardiac size within normal limits. No pericardial effusion. Moderate atherosclerotic calcification within the thoracic aorta. No aortic aneurysm. Mediastinum/Nodes: No enlarged mediastinal, hilar, or axillary lymph nodes. Thyroid gland, trachea, and esophagus demonstrate no significant findings. Lungs/Pleura: Small bilateral pleural effusions are present. There is diffuse  but perihilar predominant ground-glass pulmonary infiltrates with cyst of the septal thickening most in keeping with interstitial and alveolar pulmonary edema. No pneumothorax. Thickening of the peribronchovascular interstitium in keeping with interstitial pulmonary edema. No central obstructing mass. Upper Abdomen: No acute  abnormality. Reflux of contrast into the hepatic venous system in keeping with some degree of right heart failure. Musculoskeletal: Degenerative changes are seen within the thoracic spine flowing disc osteophytes throughout thoracic spine in keeping with changes of diffuse idiopathic skeletal hyperostosis. No acute bone abnormality. Review of the MIP images confirms the above findings. IMPRESSION: 1. No pulmonary embolism. 2. Extensive multi-vessel coronary artery calcification. 3. Small bilateral pleural effusions with diffuse but perihilar predominant ground-glass pulmonary infiltrates with cyst of the septal thickening most in keeping with interstitial and alveolar pulmonary edema, likely cardiogenic in nature. Aortic Atherosclerosis (ICD10-I70.0). Electronically Signed   By: Helyn Numbers M.D.   On: 11/10/2023 04:17   DG Chest Port 1 View Result Date: 11/10/2023 CLINICAL DATA:  Dyspnea EXAM: PORTABLE CHEST 1 VIEW COMPARISON:  12/12/2017 FINDINGS: The lungs are symmetrically well expanded. Mild right basilar atelectasis or infiltrate. No pneumothorax or pleural effusion. Mild cardiomegaly. Central pulmonary vascular congestion without overt pulmonary edema. No acute bone abnormality. IMPRESSION: 1. Mild right basilar atelectasis or infiltrate. 2. Mild cardiomegaly. Electronically Signed   By: Helyn Numbers M.D.   On: 11/10/2023 02:59   VAS [place] LOWER EXTREMITY ARTERIAL DUPLEX Result Date: 11/08/2023 LOWER EXTREMITY ARTERIAL DUPLEX STUDY Patient Name:  LARRISA CRAVEY  Date of Exam:   11/08/2023 Medical Rec #: [id number]           Accession #:    [id number] Date of Birth: 25-Nov-1940           Patient Gender: F Patient Age:   42 years Exam Location:  [facility] Procedure:      VAS [place] LOWER EXTREMITY ARTERIAL DUPLEX Referring Phys: Heath Lark --------------------------------------------------------------------------------  Indications: Rest pain, and peripheral artery disease. High Risk  Factors: Hypertension, hyperlipidemia, Diabetes, no history of                    smoking, coronary artery disease.  Current ABI: right= noncompressible/0.31, left=absent/absent Comparison Study: No prior study Performing Technologist: Gertie Fey MHA, RDMS, RVT, RDCS  Examination Guidelines: A complete evaluation includes B-mode imaging, spectral Doppler, color Doppler, and power Doppler as needed of all accessible portions of each vessel. Bilateral testing is considered an integral part of a complete examination. Limited examinations for reoccurring indications may be performed as noted.   +-----------+--------+-----+---------------+-------------------+--------+ LEFT       PSV cm/sRatioStenosis       Waveform           Comments +-----------+--------+-----+---------------+-------------------+--------+ CIA Mid    285          50-74% stenosismonophasic                  +-----------+--------+-----+---------------+-------------------+--------+ EIA Prox   66                          monophasic                  +-----------+--------+-----+---------------+-------------------+--------+ EIA Mid    340          50-74% stenosismonophasic                  +-----------+--------+-----+---------------+-------------------+--------+ EIA Distal 65  monophasic                  +-----------+--------+-----+---------------+-------------------+--------+ CFA Distal 73                          monophasic                  +-----------+--------+-----+---------------+-------------------+--------+ DFA        61                          monophasic                  +-----------+--------+-----+---------------+-------------------+--------+ SFA Prox   15                          Dampened monophasic         +-----------+--------+-----+---------------+-------------------+--------+ SFA Mid    17                          dampened monophasic          +-----------+--------+-----+---------------+-------------------+--------+ SFA Distal 14                          dampened monophasic         +-----------+--------+-----+---------------+-------------------+--------+ POP Prox   46                          monophasic                  +-----------+--------+-----+---------------+-------------------+--------+ POP Distal 44                          monophasic                  +-----------+--------+-----+---------------+-------------------+--------+ TP Trunk   26                          dampened monophasic         +-----------+--------+-----+---------------+-------------------+--------+ ATA Prox   20                          dampened monophasic         +-----------+--------+-----+---------------+-------------------+--------+ ATA Mid    23                          dampened monophasic         +-----------+--------+-----+---------------+-------------------+--------+ ATA Distal 23                          dampened monophasic         +-----------+--------+-----+---------------+-------------------+--------+ PTA Prox   24                          dampened monophasic         +-----------+--------+-----+---------------+-------------------+--------+ PTA Mid    30                          dampened monophasic         +-----------+--------+-----+---------------+-------------------+--------+ PTA Distal 26  dampened monophasic         +-----------+--------+-----+---------------+-------------------+--------+ PERO Mid   24                          dampened monophasic         +-----------+--------+-----+---------------+-------------------+--------+ PERO Distal                            unable to insonate          +-----------+--------+-----+---------------+-------------------+--------+  Summary: Left: 50-74% stenosis noted in the common and external iliac arteries. Heavily calcified  common and external iliac arteries. Dampened monophasic flow in lower extremity arterial system.  See table(s) above for measurements and observations. Electronically signed by Heath Lark on 11/08/2023 at 4:42:55 PM.    Final    VAS [place] ABI WITH/WO TBI Result Date: 11/08/2023  LOWER EXTREMITY DOPPLER STUDY Patient Name:  SYBRINA LANING  Date of Exam:   11/08/2023 Medical Rec #: [id number]           Accession #:    [id number] Date of Birth: 05-Jun-1941           Patient Gender: F Patient Age:   69 years Exam Location:  [facility] Procedure:      VAS [place] ABI WITH/WO TBI Referring Phys: Heath Lark --------------------------------------------------------------------------------  Indications: Rest pain, and peripheral artery disease. High Risk Factors: Hypertension, hyperlipidemia, Diabetes, no history of                    smoking.  Comparison Study: 08/30/2023 ABI/TBI- right=Noncompressible/0.62,                   left=0.26/absent Performing Technologist: Gertie Fey MHA, RVT, RDCS, RDMS  Examination Guidelines: A complete evaluation includes at minimum, Doppler waveform signals and systolic blood pressure reading at the level of bilateral brachial, anterior tibial, and posterior tibial arteries, when vessel segments are accessible. Bilateral testing is considered an integral part of a complete examination. Photoelectric Plethysmograph (PPG) waveforms and toe systolic pressure readings are included as required and additional duplex testing as needed. Limited examinations for reoccurring indications may be performed as noted.  ABI Findings: +---------+------------------+-----+----------+--------+ Right    Rt Pressure (mmHg)IndexWaveform  Comment  +---------+------------------+-----+----------+--------+ Brachial 153                                       +---------+------------------+-----+----------+--------+ ATA      Noncompressible        monophasic          +---------+------------------+-----+----------+--------+ PTA      Noncompressible        monophasic         +---------+------------------+-----+----------+--------+ Great Toe51                0.31                    +---------+------------------+-----+----------+--------+ +---------+------------------+-----+------------------+-------+ Left     Lt Pressure (mmHg)IndexWaveform          Comment +---------+------------------+-----+------------------+-------+ Brachial 162                                              +---------+------------------+-----+------------------+-------+ ATA  unable to insonate        +---------+------------------+-----+------------------+-------+ PTA                             unable to insonate        +---------+------------------+-----+------------------+-------+ Great Toe0                 0.00                           +---------+------------------+-----+------------------+-------+ +-------+---------------+-----------+---------------+------------+ ABI/TBIToday's ABI    Today's TBIPrevious ABI   Previous TBI +-------+---------------+-----------+---------------+------------+ Right  Noncompressible0.31       Noncompressible0.62         +-------+---------------+-----------+---------------+------------+ Left   Absent         Absent     0.26           Absent       +-------+---------------+-----------+---------------+------------+  Right ABIs appear essentially unchanged compared to prior study on 08/30/2023. Left ABIs appear decreased compared to prior study on 08/30/2023.  Summary: Right: Resting right ankle-brachial index indicates noncompressible right lower extremity arteries. The right toe-brachial index is abnormal. Left: Resting left ankle-brachial index indicates critical left limb ischemia. The left toe-brachial index is abnormal. *See table(s) above for measurements and observations.  Electronically  signed by Heath Lark on 11/08/2023 at 4:42:39 PM.    Final      Assessment and Plan:   NSTEMI, type I versus less likely type II Coronary artery disease Presenting with reported chest discomfort and dyspnea with elevated and uptrending high-sensitivity cardiac enzymes consistent with possible type I NSTEMI.  Has significant coronary artery calcifications as well as peripheral artery disease requiring intervention.  She certainly has the substrate for ischemic coronary disease.  Fortunately, she is chest pain-free at this time.  She had plans for a lower extremity angiogram with possible intervention tomorrow 3/14 for a nonhealing left lateral calf wound concerning for chronic limb threatening ischemia.  Will treat medically for NSTEMI at this time and discussed with our interventional colleagues the role for coronary angiogram in the context of likely significant calcific disease and whether or not this can be coordinated with her lower extremity angiogram.  No obvious type II stressor outside of afib with mildly increased ventricular rate response. - Status post 324 mg of aspirin, 81 mg of aspirin daily thereafter - Heparin drip for ACS - Echocardiogram - N.p.o. - Lipids - Trend troponin to peak every 2 hours - Continue statin and Zetia - continue home metoprolol - IV lasix x1 for pulmonary edema on CT  Peripheral artery disease Plans for lower extremity angiogram for chronic limb threatening ischemia and left lateral calf nonhealing ulcer on 11/11/2023.  Will coordinate with vascular surgery. - Heparin drip as above - Continue aspirin daily - Statin and Zetia  Carotid artery disease Management as above  Persistent atrial fibrillation Been holding apixaban since 3/11 due to angiogram on 11/11/2023 - heparin as above - rate control with metoprolol  Risk Assessment/Risk Scores:     TIMI Risk Score for Unstable Angina or Non-ST Elevation MI:   The patient's TIMI risk score is 3,  which indicates a 13% risk of all cause mortality, new or recurrent myocardial infarction or need for urgent revascularization in the next 14 days.    CHA2DS2-VASc Score =   5  This indicates a 7.2 % annual risk of stroke. The patient's score is based  upon:           For questions or updates, please contact [facility] Please consult [email or web address] for contact info under    Signed, Aundra Dubin, MD  11/10/2023 5:10 AM

## 2023-11-10 NOTE — ED Notes (Addendum)
 Reached out to provider about getting something else for pt's left leg pain. Pt NPO. Provider ordering 1 dose IV dilaudid. Waiting to be verified by pharmacy

## 2023-11-10 NOTE — Progress Notes (Addendum)
 2130 : Patient heart rate went up to 140's and sustaining in the 130's. Patient complains of pain 10/10. Lopressor  PO 50 mg given as scheduled ,Dilaudid IV 0.5 mg given with no relieve. Vital signs taken. EKG showed Afib RVR. Dr Loney Loh page. Per Dr Loney Loh  I should call cardiology and let them manage heart rate.  2151 : Dr Orson Aloe paged. Call back received from MD. Per MD he is fine with heart rate sustaining in the 130's since patient already received lopressor .

## 2023-11-10 NOTE — Progress Notes (Signed)
 Transition of Care [facility]) - Inpatient Brief Assessment   Patient Details  Name: Tina Hester MRN: [id number] Date of Birth: Jul 10, 1941  Transition of Care [facility]) CM/SW Contact:    Oletta Cohn, RN Phone Number: 11/10/2023, 2:03 PM   Clinical Narrative: PT from home with daughter in with shortness of breath.  Has had [facility] for home health services in the past and currently has private pay nurse to help with ADLs 3x/week for 3 hours; daughter does everything on days they do not come.  DME: wheelchair and lift  TOC will continue to follow for disposition needs   Transition of Care Asessment: Insurance and Status: (P) Insurance coverage has been reviewed Patient has primary care physician: (P) Yes Home environment has been reviewed: (P) home with daughter Prior level of function:: (P) 1 person assist Prior/Current Home Services: (P) Current home services (has private care nurse 3x per week, 3 hours) Social Drivers of Health Review: (P) SDOH reviewed no interventions necessary Readmission risk has been reviewed: (P) No Transition of care needs: (P) transition of care needs identified, TOC will continue to follow

## 2023-11-10 NOTE — ED Notes (Signed)
New brief applied at this time

## 2023-11-10 NOTE — Progress Notes (Signed)
 PHARMACY - ANTICOAGULATION CONSULT NOTE  Pharmacy Consult for heparin Indication: chest pain/ACS and atrial fibrillation  Allergies  Allergen Reactions   Pneumococcal Vaccine Anaphylaxis    weakness   Sulfa Antibiotics Hives   Pregabalin Other (See Comments)    Other reaction(s): depression, patient unsure    Patient Measurements: Height: 5' (152.4 cm) Weight: 85.7 kg (189 lb) IBW/kg (Calculated) : 45.5 Heparin Dosing Weight: 65kg  Vital Signs: Temp: 98.7 F (37.1 C) (03/13 1047) Temp Source: Oral (03/13 1047) BP: 134/73 (03/13 1107) Pulse Rate: 110 (03/13 1107)  Labs: Recent Labs    11/10/23 0138 11/10/23 0325 11/10/23 1230  HGB 12.0  --   --   HCT 38.7  --   --   PLT 181  --   --   APTT  --   --  94*  HEPARINUNFRC  --   --  >1.10*  CREATININE 1.12*  --   --   TROPONINIHS 466* 1,633*  --     Estimated Creatinine Clearance: 37.7 mL/min (A) (by C-G formula based on SCr of 1.12 mg/dL (H)).   Medical History: Past Medical History:  Diagnosis Date   Anemia    s/p Heme work -up normal EGD and colonscopy in 2012 per pt,neg SPEP   Anxiety    Aortic stenosis    Mild by echo 12/2021 with mean aortic valve gradient 17 mmHg   Bilateral carotid artery stenosis 06/08/2015   1-39% bilateral   Bradycardia    CKD (chronic kidney disease), stage III (HCC)    Diabetes mellitus without complication (HCC)    type 2   GERD (gastroesophageal reflux disease)    Gout    HSV-1 (herpes simplex virus 1) infection    Acyclovir prn   Hyperkalemia    Hyperlipidemia    Hypertension    Joint pain    osteoarthritis by Xray- possible erosion of R 4th MCP,elevated uric  acid ,ANA +    MCI (mild cognitive impairment)    Morbid obesity (HCC)    OSA on CPAP    Osteopenia    PAF (paroxysmal atrial fibrillation) (HCC)    Pain management    Neurosurg: Dr Odette Fraction   Pulmonary HTN (HCC)    Moderate by echo  2016. PASP   TIA (transient ischemic attack)    remote history of  TIA's early 2000    Assessment: 82yo female c/o SOB, during w/u troponin was found to be elevated >> to begin heparin.  Pt is on apixaban PTA for PAF but has been holding it since 3/11 while awaiting vascular procedure 3/14.  3/13 PM: Heparin level undetectably high at > 1.10, aPTT therapeutic at 94 while on 1200 units/hr. Will continue to monitor with aPTT until levels correlate. No issues with infusion and no new bleeding noted. CBC stable.   Goal of Therapy:  Heparin level 0.3-0.7 units/ml aPTT 66-102 seconds Monitor platelets by anticoagulation protocol: Yes   Plan:  Continue heparin infusion at 1200 units/hr. Obtain confirmatory aPTT in 8 hours Monitor heparin levels, aPTT (while DOAC affects anti-Xa assay), and CBC.  Lennie Muckle, PharmD PGY1 Pharmacy Resident 11/10/2023 1:14 PM

## 2023-11-10 NOTE — Progress Notes (Signed)
 PHARMACY - ANTICOAGULATION CONSULT NOTE  Pharmacy Consult for heparin Indication: chest pain/ACS and atrial fibrillation  Allergies  Allergen Reactions   Pneumococcal Vaccine Anaphylaxis    weakness   Pneumovax 23 [Pneumococcal Vac Polyvalent] Anaphylaxis    weakness   Other Hives    Other reaction(s): Other (See Comments) Pneumonia  Vaccine- very ill   Sulfa Antibiotics Hives   Pregabalin     Other reaction(s): depression    Patient Measurements: Height: 5' (152.4 cm) Weight: 85.7 kg (189 lb) IBW/kg (Calculated) : 45.5 Heparin Dosing Weight: 65kg  Vital Signs: Temp: 98 F (36.7 C) (03/13 0132) Temp Source: Oral (03/13 0132) BP: 152/80 (03/13 0300) Pulse Rate: 83 (03/13 0300)  Labs: Recent Labs    11/10/23 0138 11/10/23 0325  HGB 12.0  --   HCT 38.7  --   PLT 181  --   CREATININE 1.12*  --   TROPONINIHS 466* 1,633*    Estimated Creatinine Clearance: 37.7 mL/min (A) (by C-G formula based on SCr of 1.12 mg/dL (H)).   Medical History: Past Medical History:  Diagnosis Date   Anemia    s/p Heme work -up normal EGD and colonscopy in 2012 per pt,neg SPEP   Anxiety    Aortic stenosis    Mild by echo 12/2021 with mean aortic valve gradient 17 mmHg   Bilateral carotid artery stenosis 06/08/2015   1-39% bilateral   Bradycardia    CKD (chronic kidney disease), stage III (HCC)    Diabetes mellitus without complication (HCC)    type 2   GERD (gastroesophageal reflux disease)    Gout    HSV-1 (herpes simplex virus 1) infection    Acyclovir prn   Hyperkalemia    Hyperlipidemia    Hypertension    Joint pain    osteoarthritis by Xray- possible erosion of R 4th MCP,elevated uric  acid ,ANA +    MCI (mild cognitive impairment)    Morbid obesity (HCC)    OSA on CPAP    Osteopenia    PAF (paroxysmal atrial fibrillation) (HCC)    Pain management    Neurosurg: Dr Odette Fraction   Pulmonary HTN (HCC)    Moderate by echo  2016. PASP   TIA (transient ischemic  attack)    remote history of TIA's early 2000    Assessment: 83yo female c/o SOB, during w/u troponin was found to be elevated >> to begin heparin.  Pt is on apixaban PTA for PAF but has been holding it since 3/11 while awaiting vascular procedure 3/14.  Goal of Therapy:  Heparin level 0.3-0.7 units/ml aPTT 66-102 seconds Monitor platelets by anticoagulation protocol: Yes   Plan:  Start heparin infusion at 1200 units/hr. Monitor heparin levels, aPTT (while DOAC affects anti-Xa assay), and CBC.  Vernard Gambles, PharmD, BCPS  11/10/2023,4:11 AM

## 2023-11-11 ENCOUNTER — Encounter (HOSPITAL_COMMUNITY)
Admission: EM | Disposition: A | Discharge status: Home / Self Care | Payer: Self-pay | Source: Home / Self Care | Attending: Internal Medicine

## 2023-11-11 ENCOUNTER — Ambulatory Visit (HOSPITAL_COMMUNITY): Admission: RE | Admit: 2023-11-11 | Source: Home / Self Care | Admitting: Vascular Surgery

## 2023-11-11 ENCOUNTER — Encounter (HOSPITAL_COMMUNITY): Payer: Self-pay | Admitting: Cardiovascular Disease

## 2023-11-11 DIAGNOSIS — I70243 Atherosclerosis of native arteries of left leg with ulceration of ankle: Secondary | ICD-10-CM

## 2023-11-11 DIAGNOSIS — I214 Non-ST elevation (NSTEMI) myocardial infarction: Secondary | ICD-10-CM | POA: Diagnosis not present

## 2023-11-11 DIAGNOSIS — N1832 Chronic kidney disease, stage 3b: Secondary | ICD-10-CM | POA: Diagnosis not present

## 2023-11-11 DIAGNOSIS — I10 Essential (primary) hypertension: Secondary | ICD-10-CM | POA: Diagnosis not present

## 2023-11-11 DIAGNOSIS — I70245 Atherosclerosis of native arteries of left leg with ulceration of other part of foot: Secondary | ICD-10-CM

## 2023-11-11 HISTORY — PX: ABDOMINAL AORTOGRAM W/LOWER EXTREMITY: CATH118223

## 2023-11-11 HISTORY — PX: LOWER EXTREMITY INTERVENTION: CATH118252

## 2023-11-11 HISTORY — PX: LOWER EXTREMITY ANGIOGRAPHY: CATH118251

## 2023-11-11 LAB — GLUCOSE, CAPILLARY
Glucose-Capillary: 101 mg/dL — ABNORMAL HIGH (ref 70–99)
Glucose-Capillary: 112 mg/dL — ABNORMAL HIGH (ref 70–99)
Glucose-Capillary: 117 mg/dL — ABNORMAL HIGH (ref 70–99)
Glucose-Capillary: 187 mg/dL — ABNORMAL HIGH (ref 70–99)
Glucose-Capillary: 42 mg/dL — CL (ref 70–99)
Glucose-Capillary: 83 mg/dL (ref 70–99)
Glucose-Capillary: 86 mg/dL (ref 70–99)
Glucose-Capillary: 89 mg/dL (ref 70–99)

## 2023-11-11 LAB — LIPID PANEL
Cholesterol: 99 mg/dL (ref 0–200)
HDL: 29 mg/dL — ABNORMAL LOW (ref >40)
LDL Cholesterol: 56 mg/dL (ref 0–99)
Total CHOL/HDL Ratio: 3.4 ratio
Triglycerides: 71 mg/dL (ref <150)
VLDL: 14 mg/dL (ref 0–40)

## 2023-11-11 LAB — URINE CULTURE

## 2023-11-11 LAB — CBC
HCT: 35.3 % — ABNORMAL LOW (ref 36.0–46.0)
Hemoglobin: 11 g/dL — ABNORMAL LOW (ref 12.0–15.0)
MCH: 27 pg (ref 26.0–34.0)
MCHC: 31.2 g/dL (ref 30.0–36.0)
MCV: 86.5 fL (ref 80.0–100.0)
Platelets: 168 10*3/uL (ref 150–400)
RBC: 4.08 MIL/uL (ref 3.87–5.11)
RDW: 16.3 % — ABNORMAL HIGH (ref 11.5–15.5)
WBC: 8.4 10*3/uL (ref 4.0–10.5)
nRBC: 0 % (ref 0.0–0.2)

## 2023-11-11 LAB — BASIC METABOLIC PANEL
Anion gap: 10 (ref 5–15)
BUN: 26 mg/dL — ABNORMAL HIGH (ref 8–23)
CO2: 23 mmol/L (ref 22–32)
Calcium: 8.3 mg/dL — ABNORMAL LOW (ref 8.9–10.3)
Chloride: 103 mmol/L (ref 98–111)
Creatinine, Ser: 1.58 mg/dL — ABNORMAL HIGH (ref 0.44–1.00)
GFR, Estimated: 32 mL/min — ABNORMAL LOW (ref >60)
Glucose, Bld: 140 mg/dL — ABNORMAL HIGH (ref 70–99)
Potassium: 3.9 mmol/L (ref 3.5–5.1)
Sodium: 136 mmol/L (ref 135–145)

## 2023-11-11 LAB — HEMOGLOBIN A1C
Hgb A1c MFr Bld: 7.3 % — ABNORMAL HIGH (ref 4.8–5.6)
Mean Plasma Glucose: 162.81 mg/dL

## 2023-11-11 SURGERY — ABDOMINAL AORTOGRAM W/LOWER EXTREMITY
Anesthesia: LOCAL

## 2023-11-11 MED ORDER — METOPROLOL TARTRATE 25 MG PO TABS
25.0000 mg | ORAL_TABLET | Freq: Four times a day (QID) | ORAL | Status: DC
  Administered 2023-11-11 – 2023-11-12 (×2): 25 mg via ORAL
  Filled 2023-11-11 (×2): qty 1

## 2023-11-11 MED ORDER — LIDOCAINE HCL (PF) 1 % IJ SOLN
INTRAMUSCULAR | Status: AC
Start: 2023-11-11 — End: ?
  Filled 2023-11-11: qty 30

## 2023-11-11 MED ORDER — DEXTROSE 50 % IV SOLN
INTRAVENOUS | Status: AC
  Filled 2023-11-11: qty 50

## 2023-11-11 MED ORDER — HEPARIN (PORCINE) IN NACL 1000-0.9 UT/500ML-% IV SOLN
INTRAVENOUS | Status: DC | PRN
  Administered 2023-11-11: 1000 mL

## 2023-11-11 MED ORDER — SODIUM CHLORIDE 0.9 % IV SOLN: 250.0000 mL | INTRAVENOUS | Status: DC | PRN

## 2023-11-11 MED ORDER — LIDOCAINE HCL (PF) 1 % IJ SOLN
INTRAMUSCULAR | Status: DC | PRN
  Administered 2023-11-11: 10 mL

## 2023-11-11 MED ORDER — DEXTROSE 50 % IV SOLN
INTRAVENOUS | Status: DC | PRN
  Administered 2023-11-11: 1 via INTRAVENOUS

## 2023-11-11 MED ORDER — HYDRALAZINE HCL 20 MG/ML IJ SOLN: 5.0000 mg | INTRAMUSCULAR | Status: DC | PRN

## 2023-11-11 MED ORDER — SODIUM CHLORIDE 0.9 % IV BOLUS
INTRAVENOUS | Status: AC | PRN
  Administered 2023-11-11: 250 mL via INTRAVENOUS

## 2023-11-11 MED ORDER — SODIUM CHLORIDE 0.9 % IV SOLN
INTRAVENOUS | Status: DC
Start: 2023-11-11 — End: 2023-11-11

## 2023-11-11 MED ORDER — IODIXANOL 320 MG/ML IV SOLN
INTRAVENOUS | Status: DC | PRN
  Administered 2023-11-11: 24 mL

## 2023-11-11 MED ORDER — SODIUM CHLORIDE 0.9% FLUSH: 3.0000 mL | INTRAVENOUS | Status: DC | PRN

## 2023-11-11 MED ORDER — SODIUM CHLORIDE 0.9% FLUSH
3.0000 mL | Freq: Two times a day (BID) | INTRAVENOUS | Status: DC
Start: 2023-11-12 — End: 2023-11-17
  Administered 2023-11-12 – 2023-11-17 (×11): 3 mL via INTRAVENOUS

## 2023-11-11 MED ORDER — LABETALOL HCL 5 MG/ML IV SOLN: 10.0000 mg | INTRAVENOUS | Status: DC | PRN

## 2023-11-11 MED ORDER — SODIUM CHLORIDE 0.9 % WEIGHT BASED INFUSION
1.0000 mL/kg/h | INTRAVENOUS | Status: DC
  Administered 2023-11-11: 1 mL/kg/h via INTRAVENOUS

## 2023-11-11 MED ORDER — ATROPINE SULFATE 1 MG/10ML IJ SOSY
PREFILLED_SYRINGE | INTRAMUSCULAR | Status: AC
  Filled 2023-11-11: qty 10

## 2023-11-11 SURGICAL SUPPLY — 11 items
CATH OMNI FLUSH 5F 65CM (CATHETERS) IMPLANT
COVER DOME SNAP 22 D (MISCELLANEOUS) IMPLANT
GUIDEWIRE ANGLED .035X150CM (WIRE) IMPLANT
KIT ANGIASSIST CO2 SYSTEM (KITS) IMPLANT
KIT MICROPUNCTURE NIT STIFF (SHEATH) IMPLANT
KIT SINGLE USE MANIFOLD (KITS) IMPLANT
SET ATX-X65L (MISCELLANEOUS) IMPLANT
SHEATH PINNACLE 5F 10CM (SHEATH) IMPLANT
SHEATH PROBE COVER 6X72 (BAG) IMPLANT
TRAY PV CATH (CUSTOM PROCEDURE TRAY) ×1 IMPLANT
WIRE BENTSON .035X145CM (WIRE) IMPLANT

## 2023-11-11 NOTE — Progress Notes (Signed)
 Hypoglycemic Event  CBG: 42 mg/dL  Treatment: Z61 50 mL (25 gm)  Symptoms: Pale and sleepy  Follow-up CBG: Time:15 mins CBG Result:187 mg/dL  Possible Reasons for Event: Medication regimen: 14 units of NPH given at 0813 , patient was NPO for procedure today.   Comments/MD notified: Dr. Lenell Antu in the room when resulted. Verbal orders given to administer 1 amp of D50.     Tina Hester R [employer]

## 2023-11-11 NOTE — Progress Notes (Signed)
  Pt arrived from [facility] via [facility] to [facility]. Report received from RN. Pt vitals are stable, performed q37min and q31min during sheath pull. 5Fr. RFA sheath removed. Hemostasis was at 1630. Bedrest to begin. See vitals flowsheet.Report called to [phone] RN and pt to be transported via bed. Will continue to monitor while under holding area care.

## 2023-11-11 NOTE — Plan of Care (Signed)
  Problem: Education: Goal: Individualized Educational Video(s) Outcome: Progressing   Problem: Coping: Goal: Ability to adjust to condition or change in health will improve Outcome: Progressing   Problem: Fluid Volume: Goal: Ability to maintain a balanced intake and output will improve Outcome: Progressing

## 2023-11-11 NOTE — Progress Notes (Signed)
   Patient Name: Tina Hester Date of Encounter: 11/11/2023 [facility] Cardiologist: Armanda Magic, MD   Interval Summary  .    She still has L leg pain, but more comfortable today  Vital Signs .    Vitals:   11/11/23 0421 11/11/23 0718 11/11/23 0719 11/11/23 0811  BP:   92/63 (!) 121/48  Pulse:   83 94  Resp: 17  16   Temp:  97.6 F (36.4 C) 97.6 F (36.4 C)   TempSrc:  Oral Oral   SpO2:   91%   Weight:      Height:        Intake/Output Summary (Last 24 hours) at 11/11/2023 0926 Last data filed at 11/11/2023 0925 Gross per 24 hour  Intake 864.86 ml  Output 1950 ml  Net -1085.14 ml      11/11/2023    4:19 AM 11/10/2023    4:00 AM 07/04/2023    2:55 PM  Last 3 Weights  Weight (lbs) 183 lb 6.8 oz 189 lb 189 lb  Weight (kg) 83.2 kg 85.73 kg 85.73 kg      Telemetry/ECG    Atrial fibrillation, HR up to 140s  - Personally Reviewed  Physical Exam .   GEN: No acute distress.   distress  Neck: No JVD Cardiac: Irregular rate and rhythm. no murmurs, rubs, or gallops.  Vasc: +good pulse,R radial site clean and dry Respiratory: Clear to auscultation bilaterally. Normal WOB on room Air  GI: Soft, nontender, non-distended  MS: No edema in BLE   Assessment & Plan .     NSTEMI  Coronary Artery Disease  Patient presented with chest discomfort, dyspnea. hsTn [id number].  -s/p LHC 3/13. She has extensive multivessel disease. Including heavily calcified LM 60-70%. Considering her age/dementia, deferred PCI/not a CABG candidate.  -She did not have chest pain and presented with CLI -continue asa 81 mg d -continue lopressor, can consolidate to XL on dispo - Continue zetia 10 mg daily. Cont crestor 10 mg daily and titrate as tolerated    Peripheral Artery Disease  Chronic Limb ischemia -Followed by vascular surgery. Was initially scheduled for lower extremity angiogram for chronic limb threatening ischemia and left lateral calf nonhealing ulcer  - planned for  angiogram today - Vascular surgery has been consulted   Carotid Artery Disease  - Carotid ultrasound from 2020 showed 1-39% stenosis in bilateral ICAs  - Continue ASA, zetia  Persistent Atrial Fibrillation  - EKG in the ED showed atrial fibrillation, HR 111 BPM, LBBB (chronic) -has some RVR will cycle metop Q6h - heparin when feasible per vasc.     For questions or updates, please contact [facility] Please consult [email or web address] for contact info under        Signed, Jacoba Cherney, Alben Spittle, MD

## 2023-11-11 NOTE — Op Note (Signed)
 DATE OF SERVICE: 11/11/2023  PATIENT:  Tina Hester  83 y.o. female  PRE-OPERATIVE DIAGNOSIS:  Atherosclerosis of native arteries of left lower extremity causing ulceration  POST-OPERATIVE DIAGNOSIS:  Same  PROCEDURE:   1) Ultrasound guided right common femoral artery access 2) Aortogram 3) Left lower extremity angiogram with second order cannulation (30mL total contrast)   SURGEON:  Rande Brunt. Lenell Antu, MD  ASSISTANT: none  ANESTHESIA:   local  ESTIMATED BLOOD LOSS: minimal  LOCAL MEDICATIONS USED:  LIDOCAINE   COUNTS: confirmed correct.  PATIENT DISPOSITION:  PACU - hemodynamically stable.   Delay start of Pharmacological VTE agent (>24hrs) due to surgical blood loss or risk of bleeding: no  INDICATION FOR PROCEDURE: Tina Hester is a 83 y.o. female with left lower extremity ischemic ulceration. After careful discussion of risks, benefits, and alternatives the patient was offered angiography. The patient's family understood and wished to proceed.  OPERATIVE FINDINGS:   Left renal artery: patent Right renal artery: patent  Infrarenal aorta: patent  Left common iliac artery: patent; no stenosis as suggested by ultrasound Right common iliac artery: patent  Left internal iliac artery: patent Right internal iliac artery: patent  Left external iliac artery: patent Right external iliac artery: patent  Left common femoral artery: focal occlusion about takeoff of first profunda, extending through the takeoff of secondary profunda and SFA Right common femoral artery: not studied  Left profunda femoris artery: two profundas seen beyond CFA occlusion reconstituting via pelvic collaterals Right profunda femoris artery: not studied  Left superficial femoral artery: patent immediately beyond origin / CFA occlusion; patent to knee Right superficial femoral artery: not studied  Left popliteal artery: patent Right popliteal artery: not studied  Left anterior tibial artery: patent  Right anterior tibial artery: not studied  Left tibioperoneal trunk: patent Right tibioperoneal trunk: not studied  Left peroneal artery: patent Right peroneal artery: not studied  Left posterior tibial artery: patent Right posterior tibial artery: not studied  Left pedal circulation: not well studied in effort to limit contrast Right pedal circulation: not studied   GLASS score. N/A CFA disease  WIfI score. 2 / 3 / 0. Stage IV.   DESCRIPTION OF PROCEDURE: After identification of the patient in the pre-operative holding area, the patient was transferred to the operating room. The patient was positioned supine on the operating room table. The groins was prepped and draped in standard fashion. A surgical pause was performed confirming correct patient, procedure, and operative location.  The right groin was anesthetized with subcutaneous injection of 1% lidocaine. Using ultrasound guidance, the right common femoral artery was accessed with micropuncture technique. Fluoroscopy was used to confirm cannulation over the femoral head. The 23F micropuncture sheath was upsized to 73F.   A Benson wire was advanced into the distal aorta. Over the wire an omni flush catheter was advanced to the level of L2. Aortogram was performed - see above for details.   The left common iliac artery was selected with an omniflush catheter and glidewire guidewire. The wire was advanced into the common femoral artery. Over the wire the omni flush catheter was advanced into the external iliac artery. Selective angiography was performed - see above for details.   The sheath was left in place to be removed in the recovery area.  Upon completion of the case instrument and sharps counts were confirmed correct. The patient was transferred to the PACU in good condition. I was present for all portions of the procedure.  PLAN: ASA /  Statin. BMT for NSTEMI. Needs L CFA endarterectomy for limb salvage. Will need to strategize with  cardiology to optimize her for surgery.  Rande Brunt. Lenell Antu, MD [facility] Phone Number: [phone] 11/11/2023 3:13 PM

## 2023-11-11 NOTE — Progress Notes (Addendum)
  Progress Note    11/11/2023 8:45 AM 1 Day Post-Op  Subjective:  confused this morning but denies chest pain and SOB   Vitals:   11/11/23 0719 11/11/23 0811  BP: 92/63 (!) 121/48  Pulse: 83 94  Resp: 16   Temp: 97.6 F (36.4 C)   SpO2: 91%    Physical Exam Lungs:  non labored Extremities:  dressings left in place LLE Neurologic: confused  CBC    Component Value Date/Time   WBC 8.4 11/11/2023 0302   RBC 4.08 11/11/2023 0302   HGB 11.0 (L) 11/11/2023 0302   HGB 11.0 (L) 06/01/2017 1046   HCT 35.3 (L) 11/11/2023 0302   HCT 32.8 (L) 06/01/2017 1046   PLT 168 11/11/2023 0302   PLT 196 06/01/2017 1046   MCV 86.5 11/11/2023 0302   MCV 92 06/01/2017 1046   MCH 27.0 11/11/2023 0302   MCHC 31.2 11/11/2023 0302   RDW 16.3 (H) 11/11/2023 0302   RDW 14.0 06/01/2017 1046   LYMPHSABS 1.1 11/10/2023 0138   LYMPHSABS 2.4 06/01/2017 1046   MONOABS 0.9 11/10/2023 0138   EOSABS 0.0 11/10/2023 0138   EOSABS 0.1 06/01/2017 1046   BASOSABS 0.1 11/10/2023 0138   BASOSABS 0.0 06/01/2017 1046    BMET    Component Value Date/Time   NA 136 11/11/2023 0302   NA 141 02/02/2018 1051   K 3.9 11/11/2023 0302   CL 103 11/11/2023 0302   CO2 23 11/11/2023 0302   GLUCOSE 140 (H) 11/11/2023 0302   BUN 26 (H) 11/11/2023 0302   BUN 36 (H) 02/02/2018 1051   CREATININE 1.58 (H) 11/11/2023 0302   CALCIUM 8.3 (L) 11/11/2023 0302   GFRNONAA 32 (L) 11/11/2023 0302   GFRAA 45 (L) 04/11/2018 1137    INR No results found for: "INR"   Intake/Output Summary (Last 24 hours) at 11/11/2023 0845 Last data filed at 11/11/2023 0500 Gross per 24 hour  Intake 864.86 ml  Output 1950 ml  Net -1085.14 ml     Assessment/Plan:  83 y.o. female is s/p diagnostic heart cath 1 Day Post-Op   Subjectively no further chest pain or SOB;  Patient is however also confused this morning Tentatively scheduled for LLE angiogram today however this may need to be delayed due to treatment for NSTEMI as well as due  to bump in Cr after heart cath yesterday.  Will defer final decision to Dr. Ma Hillock, PA-C [facility] [phone] 11/11/2023 8:45 AM  VASCULAR STAFF ADDENDUM: I have independently interviewed and examined the patient. I agree with the above.  Plan to proceed with angiogram today. Will minimize contrast.  Left voicemail with daughter. Will discuss R/B/A with her to make sure she would like to proceed.   Rande Brunt. Lenell Antu, MD [facility] of [facility] Phone Number: [phone] 11/11/2023 9:46 AM

## 2023-11-11 NOTE — Consult Note (Addendum)
 WOC Nurse Consult Note: patient has left lateral calf wound for 2-3 weeks that has been evaluated by vascular surgeon 11/08/2023; patient has had R leg vascular intervention and was scheduled for L leg vascular intervention due to ischemic ulcers; patient admitted for MI and vascular intervention postponed  Reason for Consult: L calf wound  Wound type: full thickness ischemic ulcer  Pressure Injury POA: NA  Measurement: per nursing flowsheet  Wound bed: 100% dry yellow brown necrotic tissue  Drainage (amount, consistency, odor) moderate tan on old dressing  Periwound: mild erythema, noted other scattered areas of dry necrotic tissue  Dressing procedure/placement/frequency: Cleanse L calf ulcer with Vashe wound cleanser [place] [phone]), apply Xeroform gauze [place] [phone]) to wound bed daily, cover with dry gauze and silicone foam or Kerlix roll gauze whichever is preferred.   Will not order any chemical debridement of this area as vascular intervention has been postponed and patient does not have blood flow to support healing of open wound at this time. Per vascular note all wounds are dry and stable at this time. Patient should continue follow-up with vascular surgery once medically stable.    POC discussed with bedside nurse. WOC team will not follow. Re-consult if further needs arise.   Thank you,    Priscella Mann MSN, RN-BC, [employer] [phone]

## 2023-11-11 NOTE — Progress Notes (Signed)
 Pt kept NPO past MN

## 2023-11-11 NOTE — Plan of Care (Signed)
  Problem: Education: Goal: Ability to describe self-care measures that may prevent or decrease complications (Diabetes Survival Skills Education) will improve Outcome: Progressing Goal: Individualized Educational Video(s) Outcome: Progressing   Problem: Coping: Goal: Ability to adjust to condition or change in health will improve Outcome: Progressing   Problem: Fluid Volume: Goal: Ability to maintain a balanced intake and output will improve Outcome: Progressing   Problem: Health Behavior/Discharge Planning: Goal: Ability to identify and utilize available resources and services will improve Outcome: Progressing Goal: Ability to manage health-related needs will improve Outcome: Progressing   Problem: Metabolic: Goal: Ability to maintain appropriate glucose levels will improve Outcome: Progressing   Problem: Nutritional: Goal: Maintenance of adequate nutrition will improve Outcome: Progressing Goal: Progress toward achieving an optimal weight will improve Outcome: Progressing   Problem: Tissue Perfusion: Goal: Adequacy of tissue perfusion will improve Outcome: Progressing   Problem: Education: Goal: Understanding of CV disease, CV risk reduction, and recovery process will improve Outcome: Progressing Goal: Individualized Educational Video(s) Outcome: Progressing   Problem: Activity: Goal: Ability to return to baseline activity level will improve Outcome: Progressing   Problem: Cardiovascular: Goal: Ability to achieve and maintain adequate cardiovascular perfusion will improve Outcome: Progressing Goal: Vascular access site(s) Level 0-1 will be maintained Outcome: Progressing   Problem: Health Behavior/Discharge Planning: Goal: Ability to safely manage health-related needs after discharge will improve Outcome: Progressing   Problem: Education: Goal: Knowledge of General Education information will improve Description: Including pain rating scale,  medication(s)/side effects and non-pharmacologic comfort measures Outcome: Progressing   Problem: Health Behavior/Discharge Planning: Goal: Ability to manage health-related needs will improve Outcome: Progressing   Problem: Clinical Measurements: Goal: Ability to maintain clinical measurements within normal limits will improve Outcome: Progressing Goal: Will remain free from infection Outcome: Progressing Goal: Diagnostic test results will improve Outcome: Progressing Goal: Respiratory complications will improve Outcome: Progressing Goal: Cardiovascular complication will be avoided Outcome: Progressing   Problem: Activity: Goal: Risk for activity intolerance will decrease Outcome: Progressing   Problem: Coping: Goal: Level of anxiety will decrease Outcome: Progressing   Problem: Elimination: Goal: Will not experience complications related to bowel motility Outcome: Progressing Goal: Will not experience complications related to urinary retention Outcome: Progressing   Problem: Pain Managment: Goal: General experience of comfort will improve and/or be controlled Outcome: Progressing

## 2023-11-11 NOTE — Progress Notes (Signed)
 [facility] PROGRESS NOTE   BRUNELLA WILEMAN MVH:[id number] DOB: [birth year] DOA: 11/10/2023  PCP: [name], MD  Brief History: 83 y.o. female with medical history significant of hypertension, hyperlipidemia, persistent atrial fibrillation, peripheral vascular disease, diabetes mellitus type 2, hypothyroidism, dementia, and GERD who presents with complaints of chest pain and shortness of breath.  Patient recently developed an ulcer in the left leg and was seen by vascular surgery recently with plans for an angiogram on 3/14.  Patient was found to have elevated troponin levels.  Patient was seen by cardiology.  Patient was hospitalized for further management.   Consultants: Cardiology.  Vascular surgery.  Procedures: None yet    Subjective/Interval History: Patient pleasantly confused.  Denies any chest pain or shortness of breath.  Does not appear to be in any discomfort or distress this morning.  Does mention some pain in the left leg.    Assessment/Plan:  NSTEMI Seen by cardiology.  Underwent cardiac catheterization which showed significant coronary artery disease but not thought to be a candidate for any intervention.  Medical management is recommended. Patient is on aspirin beta-blockers Zetia and statin.  LDL is noted to be 56.  Concern for CHF/acute respiratory failure with hypoxia She was noted to have saturations of 89% on room air.  Placed on oxygen.  Echocardiogram shows LVEF of 60 to 65%.   Patient was started on twice daily furosemide by cardiology.  Creatinine noted to be elevated today.  She has received the a.m. dose of furosemide.  Will hold the evening dose and recheck renal function in the morning.  Left leg ulcer/peripheral artery disease Developed ulcer in the left lateral calf area about 2 to 3 weeks ago.  Was seen by vascular surgery recently.  Recent ABIs noted with concern raised for left lower extremity ischemia.   Vascular surgery is  following.  Tentative plan is for angiogram later today.  Permanent atrial fibrillation Anticoagulated with Eliquis which is currently on hold.  Noted to be on metoprolol which is being continued.  Diabetes mellitus type 2, uncontrolled with hyperglycemia Last HbA1c is 8 from September.  Noted to be on Jardiance and NPH at home.  This is being continued.  Monitor CBGs.  SSI.  Essential hypertension Monitor blood pressures closely.  Chronic kidney disease stage IIIb Baseline creatinine seems to be around 1.4.  Some worsening creatinine noted today.  Will hold her diuretics.  No nephrotoxic agents.  Recheck labs in the morning.  Monitor urine output.    Hyperlipidemia Continue statin and Zetia.  Was not on statin prior to admission.  History of dementia Continue donepezil.  Hypothyroidism Continue levothyroxine.  Obesity Estimated body mass index is 35.82 kg/m as calculated from the following:   Height as of this encounter: 5' (1.524 m).   Weight as of this encounter: 83.2 kg.  DVT Prophylaxis: Was not prior to admission which is currently on hold.  Was on IV heparin briefly. Code Status: Full code Family Communication:  No family at bedside Disposition Plan: Hopefully return home when improved     Medications: Scheduled:  aspirin EC  81 mg Oral Daily   donepezil  5 mg Oral QHS   ezetimibe  10 mg Oral QPM   furosemide  40 mg Intravenous BID   gabapentin  600 mg Oral QHS   insulin aspart  0-9 Units Subcutaneous TID WC   insulin NPH Human  14 Units Subcutaneous BID AC & HS   levothyroxine  150 mcg  Oral Q0600   metoprolol tartrate  25 mg Oral Q6H   rosuvastatin  10 mg Oral Daily   sertraline  50 mg Oral Daily   sodium chloride flush  3 mL Intravenous Q12H   sodium chloride flush  3 mL Intravenous Q12H   Continuous:  sodium chloride     sodium chloride 100 mL/hr at 11/11/23 0837   UJW:JXBJYN chloride, acetaminophen, ALPRAZolam, HYDROcodone-acetaminophen, ondansetron  (ZOFRAN) IV, sodium chloride flush, sodium chloride flush   Objective:  Vital Signs  Vitals:   11/11/23 0421 11/11/23 0718 11/11/23 0719 11/11/23 0811  BP:   92/63 (!) 121/48  Pulse:   83 94  Resp: 17  16   Temp:  97.6 F (36.4 C) 97.6 F (36.4 C)   TempSrc:  Oral Oral   SpO2:   91%   Weight:      Height:        Intake/Output Summary (Last 24 hours) at 11/11/2023 1040 Last data filed at 11/11/2023 1015 Gross per 24 hour  Intake 864.86 ml  Output 1400 ml  Net -535.14 ml   Filed Weights   11/10/23 0400 11/11/23 0419  Weight: 85.7 kg 83.2 kg    General appearance: Awake alert.  In no distress Resp: Clear to auscultation bilaterally.  Normal effort Cardio: S1-S2 is normal regular.  No S3-S4.  No rubs murmurs or bruit GI: Abdomen is soft.  Nontender nondistended.  Bowel sounds are present normal.  No masses organomegaly Extremities: No edema.  Full range of motion of lower extremities. Neurologic: No obvious focal neurological deficits noted.   Lab Results:  Data Reviewed: I have personally reviewed following labs and reports of the imaging studies  CBC: Recent Labs  Lab 11/10/23 0138 11/11/23 0302  WBC 10.5 8.4  NEUTROABS 8.4*  --   HGB 12.0 11.0*  HCT 38.7 35.3*  MCV 88.6 86.5  PLT 181 168    Basic Metabolic Panel: Recent Labs  Lab 11/10/23 0138 11/11/23 0302  NA 137 136  K 4.4 3.9  CL 106 103  CO2 20* 23  GLUCOSE 232* 140*  BUN 21 26*  CREATININE 1.12* 1.58*  CALCIUM 8.9 8.3*    GFR: Estimated Creatinine Clearance: 26.3 mL/min (A) (by C-G formula based on SCr of 1.58 mg/dL (H)).   CBG: Recent Labs  Lab 11/10/23 0826 11/10/23 1224 11/10/23 1711 11/10/23 2111 11/11/23 0605  GLUCAP 169* 166* 164* 163* 101*    Thyroid Function Tests: Recent Labs    11/10/23 0325  TSH 2.139     Recent Results (from the past 240 hours)  Resp panel by RT-PCR (RSV, Flu A&B, Covid) Anterior Nasal Swab     Status: None   Collection Time: 11/10/23   1:42 AM   Specimen: Anterior Nasal Swab  Result Value Ref Range Status   SARS Coronavirus 2 by RT PCR NEGATIVE NEGATIVE Final   Influenza A by PCR NEGATIVE NEGATIVE Final   Influenza B by PCR NEGATIVE NEGATIVE Final    Comment: (NOTE) The Xpert Xpress SARS-CoV-2/FLU/RSV plus assay is intended as an aid in the diagnosis of influenza from Nasopharyngeal swab specimens and should not be used as a sole basis for treatment. Nasal washings and aspirates are unacceptable for Xpert Xpress SARS-CoV-2/FLU/RSV testing.  Fact Sheet for Patients: BloggerCourse.com  Fact Sheet for Healthcare Providers: SeriousBroker.it  This test is not yet approved or cleared by the [place] FDA and has been authorized for detection and/or diagnosis of SARS-CoV-2 by FDA under an Emergency  Use Authorization (EUA). This EUA will remain in effect (meaning this test can be used) for the duration of the COVID-19 declaration under Section 564(b)(1) of the Act, 21 U.S.C. section 360bbb-3(b)(1), unless the authorization is terminated or revoked.     Resp Syncytial Virus by PCR NEGATIVE NEGATIVE Final    Comment: (NOTE) Fact Sheet for Patients: BloggerCourse.com  Fact Sheet for Healthcare Providers: SeriousBroker.it  This test is not yet approved or cleared by the [place] FDA and has been authorized for detection and/or diagnosis of SARS-CoV-2 by FDA under an Emergency Use Authorization (EUA). This EUA will remain in effect (meaning this test can be used) for the duration of the COVID-19 declaration under Section 564(b)(1) of the Act, 21 U.S.C. section 360bbb-3(b)(1), unless the authorization is terminated or revoked.  Performed at [facility], [address] [place], [place] [address]   Urine Culture     Status: Abnormal   Collection Time: 11/10/23  5:22 AM   Specimen: Urine, Random   Result Value Ref Range Status   Specimen Description URINE, RANDOM  Final   Special Requests   Final    NONE Reflexed from [id number] Performed at [facility], [address] [place], [place] [address]    Culture MULTIPLE SPECIES PRESENT, SUGGEST RECOLLECTION (A)  Final   Report Status 11/11/2023 FINAL  Final      Radiology Studies: ECHOCARDIOGRAM COMPLETE Result Date: 11/10/2023    ECHOCARDIOGRAM REPORT   Patient Name:   Tina Hester Date of Exam: 11/10/2023 Medical Rec #:  [id number]          Height:       60.0 in Accession #:    [id number]         Weight:       189.0 lb Date of Birth:  [birth year]          BSA:          1.822 m Patient Age:    82 years           BP:           134/73 mmHg Patient Gender: F                  HR:           95 bpm. Exam Location:  Inpatient Procedure: Cardiac Doppler, Color Doppler and 2D Echo (Both Spectral and Color            Flow Doppler were utilized during procedure). Indications:    CHF  History:        Patient has prior history of Echocardiogram examinations, most                 recent 01/05/2022. CAD, TIA, Aortic Valve Disease,                 Arrythmias:Atrial Fibrillation; Risk Factors:Hypertension and                 Diabetes.  Sonographer:    Amy Chionchio Referring Phys: [id number] RONDELL A SMITH IMPRESSIONS  1. Left ventricular ejection fraction, by estimation, is 60 to 65%. The left ventricle has normal function. Left ventricular endocardial border not optimally defined to evaluate regional wall motion. Left ventricular diastolic function could not be evaluated.  2. Right ventricular systolic function is normal. The right ventricular size is normal. There is severely elevated pulmonary artery systolic pressure. The estimated right ventricular systolic pressure is 63.1 mmHg.  3. Right atrial size was mildly dilated.  4. The mitral valve is degenerative. Trivial mitral valve regurgitation. No evidence of mitral stenosis. The mean mitral valve gradient  is 4.0 mmHg.  5. The aortic valve is calcified. Aortic valve regurgitation is mild. Aortic valve mean gradient measures 18.0 mmHg. Aortic valve Vmax measures 2.70 m/s. Although the mean AVG and Vmax are more consistent with mild AS, the SVI is low at 20 and DVI very low at 0.24. Findings most consistent with low flow low gradient severe AS.  6. The inferior vena cava is normal in size with <50% respiratory variability, suggesting right atrial pressure of 8 mmHg. FINDINGS  Left Ventricle: Left ventricular ejection fraction, by estimation, is 60 to 65%. The left ventricle has normal function. Left ventricular endocardial border not optimally defined to evaluate regional wall motion. The left ventricular internal cavity size was normal in size. There is no left ventricular hypertrophy. Left ventricular diastolic function could not be evaluated. Right Ventricle: The right ventricular size is normal. No increase in right ventricular wall thickness. Right ventricular systolic function is normal. There is severely elevated pulmonary artery systolic pressure. The tricuspid regurgitant velocity is 3.71 m/s, and with an assumed right atrial pressure of 8 mmHg, the estimated right ventricular systolic pressure is 63.1 mmHg. Left Atrium: Left atrial size was normal in size. Right Atrium: Right atrial size was mildly dilated. Pericardium: There is no evidence of pericardial effusion. Mitral Valve: The mitral valve is degenerative in appearance. There is mild thickening of the mitral valve leaflet(s). There is mild calcification of the mitral valve leaflet(s). Mild to moderate mitral annular calcification. Trivial mitral valve regurgitation. No evidence of mitral valve stenosis. MV peak gradient, 8.1 mmHg. The mean mitral valve gradient is 4.0 mmHg. Tricuspid Valve: The tricuspid valve is normal in structure. Tricuspid valve regurgitation is mild . No evidence of tricuspid stenosis. Aortic Valve: The aortic valve is calcified.  Aortic valve regurgitation is mild. Aortic regurgitation PHT measures 429 msec. Severe aortic stenosis is present. Aortic valve mean gradient measures 18.0 mmHg. Aortic valve peak gradient measures 29.2 mmHg. Aortic valve area, by VTI measures 0.68 cm. Pulmonic Valve: The pulmonic valve was normal in structure. Pulmonic valve regurgitation is not visualized. No evidence of pulmonic stenosis. Aorta: The aortic root is normal in size and structure. Venous: The inferior vena cava is normal in size with less than 50% respiratory variability, suggesting right atrial pressure of 8 mmHg. IAS/Shunts: No atrial level shunt detected by color flow Doppler.  LEFT VENTRICLE PLAX 2D LVIDd:         4.00 cm LVIDs:         3.50 cm LV PW:         1.10 cm LV IVS:        1.10 cm LVOT diam:     1.90 cm LV SV:         37 LV SV Index:   20 LVOT Area:     2.84 cm  LV Volumes (MOD) LV vol d, MOD A2C: 57.7 ml LV vol d, MOD A4C: 67.6 ml LV vol s, MOD A2C: 20.7 ml LV vol s, MOD A4C: 28.1 ml LV SV MOD A2C:     37.0 ml LV SV MOD A4C:     67.6 ml LV SV MOD BP:      39.6 ml RIGHT VENTRICLE          IVC RV Basal diam:  3.80 cm  IVC diam: 2.00  cm RV Mid diam:    3.10 cm TAPSE (M-mode): 1.3 cm LEFT ATRIUM             Index        RIGHT ATRIUM           Index LA Vol (A2C):   46.2 ml 25.38 ml/m  RA Area:     19.10 cm LA Vol (A4C):   46.8 ml 25.68 ml/m  RA Volume:   56.10 ml  30.79 ml/m LA Biplane Vol: 50.5 ml 27.71 ml/m  AORTIC VALVE                     PULMONIC VALVE AV Area (Vmax):    0.79 cm      PV Vmax:       0.93 m/s AV Area (Vmean):   0.73 cm      PV Peak grad:  3.4 mmHg AV Area (VTI):     0.68 cm AV Vmax:           270.00 cm/s AV Vmean:          198.000 cm/s AV VTI:            0.538 m AV Peak Grad:      29.2 mmHg AV Mean Grad:      18.0 mmHg LVOT Vmax:         75.00 cm/s LVOT Vmean:        51.300 cm/s LVOT VTI:          0.129 m LVOT/AV VTI ratio: 0.24 AI PHT:            429 msec  AORTA Ao Root diam: 2.40 cm Ao Asc diam:  2.90 cm  MITRAL VALVE              TRICUSPID VALVE MV Area (PHT): 2.37 cm   TR Peak grad:   55.1 mmHg MV Area VTI:   1.45 cm   TR Vmax:        371.00 cm/s MV Peak grad:  8.1 mmHg MV Mean grad:  4.0 mmHg   SHUNTS MV Vmax:       1.42 m/s   Systemic VTI:  0.13 m MV Vmean:      90.7 cm/s  Systemic Diam: 1.90 cm Armanda Magic MD Electronically signed by Armanda Magic MD Signature Date/Time: 11/10/2023/5:01:30 PM    Final    CARDIAC CATHETERIZATION Result Date: 11/10/2023   Ost RCA to Prox RCA lesion is 70% stenosed.   Prox RCA to Mid RCA lesion is 40% stenosed.   Ost LM to Mid LM lesion is 70% stenosed.   Prox Cx lesion is 90% stenosed.   Prox Cx to Mid Cx lesion is 40% stenosed.   Dist Cx lesion is 60% stenosed.   Prox LAD lesion is 70% stenosed. Heavily calcified left main with 60-70% eccentric mid stenosis The LAD is large caliber vessel that courses to the apex. Severe heavily calcified proximal LAD stenosis The large caliber Circumflex is heavily calcified in the proximal and mid vessel. There is severe, focal lesion in the mid Circumflex and a moderate stenosis in the distal AV groove Circumflex The large, dominant RCA is heavily calcified. Severe ostial stenosis LVEDP=5-8 mmHg Recommendations: Severe left main and three vessel CAD in elderly patient with dementia. I would not recommend PCI as a treatment option and would favor medical management of her CAD. She is not a bypass candidate. Would attempt to rate control her  atrial fibrillation. Hopefully she will have percutaneous options for treatment of her ischemic left leg. Continue ASA. I would load with Plavix if ok with the Vascular team following her diagnostic PV study tomorrow.   CT Angio Chest Pulmonary Embolism (PE) W or WO Contrast Result Date: 11/10/2023 CLINICAL DATA:  Pulmonary embolism EXAM: CT ANGIOGRAPHY CHEST WITH CONTRAST TECHNIQUE: Multidetector CT imaging of the chest was performed using the standard protocol during bolus administration of  intravenous contrast. Multiplanar CT image reconstructions and MIPs were obtained to evaluate the vascular anatomy. RADIATION DOSE REDUCTION: This exam was performed according to the departmental dose-optimization program which includes automated exposure control, adjustment of the mA and/or kV according to patient size and/or use of iterative reconstruction technique. CONTRAST:  75mL OMNIPAQUE IOHEXOL 350 MG/ML SOLN COMPARISON:  None Available. FINDINGS: Cardiovascular: Adequate opacification of the pulmonary arterial tree. No intraluminal filling defect identified to suggest acute pulmonary embolism. Central pulmonary arteries are of normal caliber. Extensive multi-vessel coronary artery calcification. Cardiac size within normal limits. No pericardial effusion. Moderate atherosclerotic calcification within the thoracic aorta. No aortic aneurysm. Mediastinum/Nodes: No enlarged mediastinal, hilar, or axillary lymph nodes. Thyroid gland, trachea, and esophagus demonstrate no significant findings. Lungs/Pleura: Small bilateral pleural effusions are present. There is diffuse but perihilar predominant ground-glass pulmonary infiltrates with cyst of the septal thickening most in keeping with interstitial and alveolar pulmonary edema. No pneumothorax. Thickening of the peribronchovascular interstitium in keeping with interstitial pulmonary edema. No central obstructing mass. Upper Abdomen: No acute abnormality. Reflux of contrast into the hepatic venous system in keeping with some degree of right heart failure. Musculoskeletal: Degenerative changes are seen within the thoracic spine flowing disc osteophytes throughout thoracic spine in keeping with changes of diffuse idiopathic skeletal hyperostosis. No acute bone abnormality. Review of the MIP images confirms the above findings. IMPRESSION: 1. No pulmonary embolism. 2. Extensive multi-vessel coronary artery calcification. 3. Small bilateral pleural effusions with diffuse  but perihilar predominant ground-glass pulmonary infiltrates with cyst of the septal thickening most in keeping with interstitial and alveolar pulmonary edema, likely cardiogenic in nature. Aortic Atherosclerosis (ICD10-I70.0). Electronically Signed   By: Helyn Numbers M.D.   On: 11/10/2023 04:17   DG Chest Port 1 View Result Date: 11/10/2023 CLINICAL DATA:  Dyspnea EXAM: PORTABLE CHEST 1 VIEW COMPARISON:  12/12/2017 FINDINGS: The lungs are symmetrically well expanded. Mild right basilar atelectasis or infiltrate. No pneumothorax or pleural effusion. Mild cardiomegaly. Central pulmonary vascular congestion without overt pulmonary edema. No acute bone abnormality. IMPRESSION: 1. Mild right basilar atelectasis or infiltrate. 2. Mild cardiomegaly. Electronically Signed   By: Helyn Numbers M.D.   On: 11/10/2023 02:59       LOS: 1 day   Dhana Totton  [facility] Pager on [email or web address]  11/11/2023, 10:40 AM

## 2023-11-12 DIAGNOSIS — N179 Acute kidney failure, unspecified: Secondary | ICD-10-CM

## 2023-11-12 DIAGNOSIS — I214 Non-ST elevation (NSTEMI) myocardial infarction: Secondary | ICD-10-CM | POA: Diagnosis not present

## 2023-11-12 DIAGNOSIS — N1832 Chronic kidney disease, stage 3b: Secondary | ICD-10-CM | POA: Diagnosis not present

## 2023-11-12 DIAGNOSIS — I10 Essential (primary) hypertension: Secondary | ICD-10-CM | POA: Diagnosis not present

## 2023-11-12 DIAGNOSIS — I70245 Atherosclerosis of native arteries of left leg with ulceration of other part of foot: Secondary | ICD-10-CM | POA: Diagnosis not present

## 2023-11-12 DIAGNOSIS — I70243 Atherosclerosis of native arteries of left leg with ulceration of ankle: Secondary | ICD-10-CM | POA: Diagnosis not present

## 2023-11-12 LAB — LIPID PANEL
Cholesterol: 87 mg/dL (ref 0–200)
HDL: 17 mg/dL — ABNORMAL LOW (ref >40)
LDL Cholesterol: 48 mg/dL (ref 0–99)
Total CHOL/HDL Ratio: 5.1 ratio
Triglycerides: 111 mg/dL (ref <150)
VLDL: 22 mg/dL (ref 0–40)

## 2023-11-12 LAB — BASIC METABOLIC PANEL
Anion gap: 11 (ref 5–15)
BUN: 33 mg/dL — ABNORMAL HIGH (ref 8–23)
CO2: 23 mmol/L (ref 22–32)
Calcium: 7.7 mg/dL — ABNORMAL LOW (ref 8.9–10.3)
Chloride: 103 mmol/L (ref 98–111)
Creatinine, Ser: 1.79 mg/dL — ABNORMAL HIGH (ref 0.44–1.00)
GFR, Estimated: 28 mL/min — ABNORMAL LOW (ref >60)
Glucose, Bld: 159 mg/dL — ABNORMAL HIGH (ref 70–99)
Potassium: 3.4 mmol/L — ABNORMAL LOW (ref 3.5–5.1)
Sodium: 137 mmol/L (ref 135–145)

## 2023-11-12 LAB — GLUCOSE, CAPILLARY
Glucose-Capillary: 186 mg/dL — ABNORMAL HIGH (ref 70–99)
Glucose-Capillary: 123 mg/dL — ABNORMAL HIGH (ref 70–99)
Glucose-Capillary: 126 mg/dL — ABNORMAL HIGH (ref 70–99)
Glucose-Capillary: 183 mg/dL — ABNORMAL HIGH (ref 70–99)

## 2023-11-12 LAB — CBC
HCT: 31.7 % — ABNORMAL LOW (ref 36.0–46.0)
Hemoglobin: 9.9 g/dL — ABNORMAL LOW (ref 12.0–15.0)
MCH: 27.1 pg (ref 26.0–34.0)
MCHC: 31.2 g/dL (ref 30.0–36.0)
MCV: 86.8 fL (ref 80.0–100.0)
Platelets: 152 10*3/uL (ref 150–400)
RBC: 3.65 MIL/uL — ABNORMAL LOW (ref 3.87–5.11)
RDW: 16.2 % — ABNORMAL HIGH (ref 11.5–15.5)
WBC: 7.2 10*3/uL (ref 4.0–10.5)
nRBC: 0 % (ref 0.0–0.2)

## 2023-11-12 MED ORDER — HEPARIN SODIUM (PORCINE) 5000 UNIT/ML IJ SOLN
5000.0000 [IU] | Freq: Three times a day (TID) | INTRAMUSCULAR | Status: DC
  Administered 2023-11-12 – 2023-11-13 (×3): 5000 [IU] via SUBCUTANEOUS
  Filled 2023-11-12 (×3): qty 1

## 2023-11-12 MED ORDER — POTASSIUM CHLORIDE CRYS ER 20 MEQ PO TBCR
40.0000 meq | EXTENDED_RELEASE_TABLET | Freq: Once | ORAL | Status: AC
  Administered 2023-11-12: 40 meq via ORAL
  Filled 2023-11-12: qty 2

## 2023-11-12 MED ORDER — METOPROLOL TARTRATE 25 MG PO TABS
25.0000 mg | ORAL_TABLET | Freq: Two times a day (BID) | ORAL | Status: DC
Start: 2023-11-13 — End: 2023-11-17
  Administered 2023-11-13 – 2023-11-17 (×9): 25 mg via ORAL
  Filled 2023-11-12 (×9): qty 1

## 2023-11-12 MED ORDER — GABAPENTIN 300 MG PO CAPS
300.0000 mg | ORAL_CAPSULE | Freq: Every day | ORAL | Status: DC
  Administered 2023-11-12 – 2023-11-16 (×5): 300 mg via ORAL
  Filled 2023-11-12 (×5): qty 1

## 2023-11-12 MED ORDER — ROSUVASTATIN CALCIUM 20 MG PO TABS
20.0000 mg | ORAL_TABLET | Freq: Every day | ORAL | Status: DC
  Administered 2023-11-12 – 2023-11-17 (×6): 20 mg via ORAL
  Filled 2023-11-12 (×6): qty 1

## 2023-11-12 MED ORDER — SODIUM CHLORIDE 0.9 % IV SOLN: INTRAVENOUS | Status: AC

## 2023-11-12 MED ORDER — SODIUM CHLORIDE 0.9 % IV SOLN
1.0000 g | INTRAVENOUS | Status: AC
  Administered 2023-11-12 – 2023-11-16 (×5): 1 g via INTRAVENOUS
  Filled 2023-11-12 (×5): qty 10

## 2023-11-12 NOTE — Progress Notes (Addendum)
 Progress Note    11/12/2023 7:09 AM 1 Day Post-Op  Subjective:  says she is having pain in her left outer thigh and upper calf  afebrile  Vitals:   11/12/23 0341 11/12/23 0512  BP: (!) 98/47   Pulse: (!) 101 73  Resp: 15 15  Temp: 98 F (36.7 C)   SpO2: 95%     Physical Exam: General:  no distress Cardiac:  regular Lungs:  non labored Incisions:  right groin soft with some ecchymosis.  No hematoma appreciated.  Extremities:  brisk right PT doppler flow; monophasic left PT doppler flow.  Left thigh and calf very soft and non tender to exam and palpation.   Abdomen:  soft  CBC    Component Value Date/Time   WBC 7.2 11/12/2023 0351   RBC 3.65 (L) 11/12/2023 0351   HGB 9.9 (L) 11/12/2023 0351   HGB 11.0 (L) 06/01/2017 1046   HCT 31.7 (L) 11/12/2023 0351   HCT 32.8 (L) 06/01/2017 1046   PLT 152 11/12/2023 0351   PLT 196 06/01/2017 1046   MCV 86.8 11/12/2023 0351   MCV 92 06/01/2017 1046   MCH 27.1 11/12/2023 0351   MCHC 31.2 11/12/2023 0351   RDW 16.2 (H) 11/12/2023 0351   RDW 14.0 06/01/2017 1046   LYMPHSABS 1.1 11/10/2023 0138   LYMPHSABS 2.4 06/01/2017 1046   MONOABS 0.9 11/10/2023 0138   EOSABS 0.0 11/10/2023 0138   EOSABS 0.1 06/01/2017 1046   BASOSABS 0.1 11/10/2023 0138   BASOSABS 0.0 06/01/2017 1046    BMET    Component Value Date/Time   NA 137 11/12/2023 0351   NA 141 02/02/2018 1051   K 3.4 (L) 11/12/2023 0351   CL 103 11/12/2023 0351   CO2 23 11/12/2023 0351   GLUCOSE 159 (H) 11/12/2023 0351   BUN 33 (H) 11/12/2023 0351   BUN 36 (H) 02/02/2018 1051   CREATININE 1.79 (H) 11/12/2023 0351   CALCIUM 7.7 (L) 11/12/2023 0351   GFRNONAA 28 (L) 11/12/2023 0351   GFRAA 45 (L) 04/11/2018 1137    INR No results found for: "INR"   Intake/Output Summary (Last 24 hours) at 11/12/2023 0709 Last data filed at 11/12/2023 0415 Gross per 24 hour  Intake 1552.57 ml  Output 500 ml  Net 1052.57 ml      Assessment/Plan:  83 y.o. female is s/p:   Angiogram via right CFA   1 Day Post-Op   -pt with brisk right PT doppler flow and monophasic left PT doppler flow.  Her right groin is soft and no hematoma is appreciated.  -c/o pain in left outer thigh and upper calf-I palpated both and they are soft and non tender to palpation.   -hx CKD - worsening creatinine today with creatinine up to 1.79 from 1.58.  per [facility], diuretics and nephrotoxic agents are being held.  -needs left CFA endarterectomy for limb salvage.  Plan for cardiology to optimize pt prior to surgery -continue asa/statin.    Doreatha Massed, PA-C [facility] [phone] 11/12/2023 7:09 AM  VASCULAR STAFF ADDENDUM: I have independently interviewed and examined the patient. I agree with the above.   Difficult situation overall.  Patient needs left femoral endarterectomy, but had recent NSTEMI.  I discussed the patient's case with Dr. Wyline Mood in detail via telephone.  She and I agree there are no great options here.  Transition to comfort measures is reasonable.  We could also attempt femoral endarterectomy under epidural anesthesia.  Less attractive is a  percutaneous solution to her common femoral occlusive plaque.  I will discuss with the family.    Rande Brunt. Lenell Antu, MD [facility] Phone Number: [phone] 11/12/2023 12:38 PM

## 2023-11-12 NOTE — Plan of Care (Signed)
 Please see note from 3/14 for final recs. If patient develops CP, don't hesitate to reach out. Cardiology will follow peripherally and if no issues will sign off

## 2023-11-12 NOTE — Progress Notes (Addendum)
 PHARMACIST LIPID MONITORING   Tina Hester is a 83 y.o. female admitted on 11/10/2023 with NSTEMI.  Pharmacy has been consulted to optimize lipid-lowering therapy with the indication of secondary prevention for clinical ASCVD.  Recent Labs:  Lipid Panel (last 6 months):   Lab Results  Component Value Date   CHOL 87 11/12/2023   TRIG 111 11/12/2023   HDL 17 (L) 11/12/2023   CHOLHDL 5.1 11/12/2023   VLDL 22 11/12/2023   LDLCALC 48 11/12/2023    Hepatic function panel (last 6 months):   No results found for: "AST", "ALT", "ALKPHOS", "BILITOT", "BILIDIR", "IBILI"  SCr (since admission):   Serum creatinine: 1.79 mg/dL (H) 29/52/84 1324 Estimated creatinine clearance: 22.4 mL/min (A)  Current therapy and lipid therapy tolerance Current lipid-lowering therapy: rosuvastatin 10 mg Previous lipid-lowering therapies (if applicable): N/A Documented or reported allergies or intolerances to lipid-lowering therapies (if applicable): leg cramps while on atorvastatin 40 mg  Assessment:   LDL 48, which is at goal of < 55. On ezetimibe PTA and was initiated on rosuvastatin 10 mg during this admission. As patient is not on the high intensity dosage of rosuvastatin, will increase dose.  Plan:    1.Statin intensity (high intensity recommended for all patients regardless of the LDL):  Statin intolerance noted. Discussed with patient's daughter, increase statin therapy to rosuvastatin 20 mg.  2.Add ezetimibe (if any one of the following): already taking PTA  3.Refer to lipid clinic:   No  4.Follow-up with:  Primary care provider - [name], MD  5.Follow-up labs after discharge:  Changes in lipid therapy were made. Check a lipid panel in 8-12 weeks then annually.      Jenita Seashore, PharmD 11/12/2023, 9:02 AM

## 2023-11-12 NOTE — Progress Notes (Signed)
 [facility] PROGRESS NOTE   ASTRA GREGG XBM:[id number] DOB: 01-18-1941 DOA: 11/10/2023  PCP: [name], MD  Brief History: 83 y.o. female with medical history significant of hypertension, hyperlipidemia, persistent atrial fibrillation, peripheral vascular disease, diabetes mellitus type 2, hypothyroidism, dementia, and GERD who presents with complaints of chest pain and shortness of breath.  Patient recently developed an ulcer in the left leg and was seen by vascular surgery recently with plans for an angiogram on 3/14.  Patient was found to have elevated troponin levels.  Patient was seen by cardiology.  Patient was hospitalized for further management.   Consultants: Cardiology.  Vascular surgery.  Procedures: Lower extremity angiogram 3/14.  Cardiac catheterization 3/13.    Subjective/Interval History: Patient noted to be pleasantly confused.  Complains of pain in the left leg on and off.  Denies any chest pain or shortness of breath.  No nausea or vomiting.      Assessment/Plan:  NSTEMI Seen by cardiology.  Underwent cardiac catheterization which showed significant coronary artery disease but not thought to be a candidate for any intervention.  Medical management is recommended. Patient is on aspirin beta-blockers Zetia and statin.  LDL is noted to be 56.  Concern for CHF/acute respiratory failure with hypoxia She was noted to have saturations of 89% on room air.  Placed on oxygen.  Echocardiogram shows LVEF of 60 to 65%.   Patient was started on twice daily furosemide by cardiology.   Furosemide to be held due to elevated creatinine.  Creatinine continues to rise.  See below.  Acute on chronic kidney disease stage IIIb-abnormal UA/concern for UTI/hypokalemia Baseline creatinine is around 1.4.  Worsening creatinine noted over the last 48 hours.  Diuretics are on hold.  Avoid nephrotoxic agents. She did receive contrast for both cardiac catheterization as  well as lower extremity angiogram.  Oral intake seems to be poor.  Will gently hydrate her over the next 24 hours and recheck labs in the morning. Continue to monitor urine output.  UA was done on 3/13 which showed large leukocytes with few bacteria and greater than 50 WBC.  Will initiate ceftriaxone. Supplement potassium  Left leg ulcer/peripheral artery disease Developed ulcer in the left lateral calf area about 2 to 3 weeks ago.  Was seen by vascular surgery recently.  Recent ABIs noted with concern raised for left lower extremity ischemia.   Vascular surgery is following.  Tentative plan is for angiogram later today.  Permanent atrial fibrillation Anticoagulated with Eliquis which is currently on hold for procedures.  Noted to be on metoprolol which is being continued.  Diabetes mellitus type 2, uncontrolled with hyperglycemia Last HbA1c is 8 from September.  Noted to be on Jardiance and NPH at home.  This is being continued.  Monitor CBGs.  SSI.  Essential hypertension Blood pressure noted to be borderline low.  Noted to be on every 6 hours metoprolol which will be decreased.  Hyperlipidemia Continue statin and Zetia.  Was not on statin prior to admission.  History of dementia Continue donepezil.  Hypothyroidism Continue levothyroxine.  Obesity Estimated body mass index is 33.71 kg/m as calculated from the following:   Height as of this encounter: 5' (1.524 m).   Weight as of this encounter: 78.3 kg.  DVT Prophylaxis: Was on apixaban prior to admission which is currently on hold.  Was on heparin briefly.  Will initiate subcutaneous heparin. Code Status: Full code Family Communication:  No family at bedside Disposition Plan: Hopefully return home  when improved     Medications: Scheduled:  aspirin EC  81 mg Oral Daily   donepezil  5 mg Oral QHS   ezetimibe  10 mg Oral QPM   gabapentin  600 mg Oral QHS   insulin aspart  0-9 Units Subcutaneous TID WC   insulin NPH Human   14 Units Subcutaneous BID AC & HS   levothyroxine  150 mcg Oral Q0600   metoprolol tartrate  25 mg Oral Q6H   rosuvastatin  20 mg Oral Daily   sertraline  50 mg Oral Daily   sodium chloride flush  3 mL Intravenous Q12H   sodium chloride flush  3 mL Intravenous Q12H   sodium chloride flush  3 mL Intravenous Q12H   Continuous:  sodium chloride     BMW:UXLKGM chloride, acetaminophen, ALPRAZolam, hydrALAZINE, HYDROcodone-acetaminophen, labetalol, ondansetron (ZOFRAN) IV, sodium chloride flush, sodium chloride flush, sodium chloride flush   Objective:  Vital Signs  Vitals:   11/11/23 2353 11/12/23 0341 11/12/23 0512 11/12/23 1018  BP: 108/69 (!) 98/47  104/77  Pulse: 89 (!) 101 73 88  Resp: 17 15 15    Temp: 98.4 F (36.9 C) 98 F (36.7 C)    TempSrc: Oral Oral    SpO2: 98% 95%    Weight:   78.3 kg   Height:        Intake/Output Summary (Last 24 hours) at 11/12/2023 1035 Last data filed at 11/12/2023 0415 Gross per 24 hour  Intake 1552.57 ml  Output --  Net 1552.57 ml   Filed Weights   11/10/23 0400 11/11/23 0419 11/12/23 0512  Weight: 85.7 kg 83.2 kg 78.3 kg    General appearance: Awake alert.  In no distress Resp: Clear to auscultation bilaterally.  Normal effort Cardio: S1-S2 is normal regular.  No S3-S4.  No rubs murmurs or bruit GI: Abdomen is soft.  Nontender nondistended.  Bowel sounds are present normal.  No masses organomegaly Extremities: Able to move all of her extremities. No obvious focal neurological deficits.  Lab Results:  Data Reviewed: I have personally reviewed following labs and reports of the imaging studies  CBC: Recent Labs  Lab 11/10/23 0138 11/11/23 0302 11/12/23 0351  WBC 10.5 8.4 7.2  NEUTROABS 8.4*  --   --   HGB 12.0 11.0* 9.9*  HCT 38.7 35.3* 31.7*  MCV 88.6 86.5 86.8  PLT 181 168 152    Basic Metabolic Panel: Recent Labs  Lab 11/10/23 0138 11/11/23 0302 11/12/23 0351  NA 137 136 137  K 4.4 3.9 3.4*  CL 106 103 103   CO2 20* 23 23  GLUCOSE 232* 140* 159*  BUN 21 26* 33*  CREATININE 1.12* 1.58* 1.79*  CALCIUM 8.9 8.3* 7.7*    GFR: Estimated Creatinine Clearance: 22.4 mL/min (A) (by C-G formula based on SCr of 1.79 mg/dL (H)).   CBG: Recent Labs  Lab 11/11/23 1519 11/11/23 1654 11/11/23 2133 11/11/23 2332 11/12/23 0545  GLUCAP 112* 86 117* 89 186*    Thyroid Function Tests: Recent Labs    11/10/23 0325  TSH 2.139     Recent Results (from the past 240 hours)  Resp panel by RT-PCR (RSV, Flu A&B, Covid) Anterior Nasal Swab     Status: None   Collection Time: 11/10/23  1:42 AM   Specimen: Anterior Nasal Swab  Result Value Ref Range Status   SARS Coronavirus 2 by RT PCR NEGATIVE NEGATIVE Final   Influenza A by PCR NEGATIVE NEGATIVE Final   Influenza B  by PCR NEGATIVE NEGATIVE Final    Comment: (NOTE) The Xpert Xpress SARS-CoV-2/FLU/RSV plus assay is intended as an aid in the diagnosis of influenza from Nasopharyngeal swab specimens and should not be used as a sole basis for treatment. Nasal washings and aspirates are unacceptable for Xpert Xpress SARS-CoV-2/FLU/RSV testing.  Fact Sheet for Patients: BloggerCourse.com  Fact Sheet for Healthcare Providers: SeriousBroker.it  This test is not yet approved or cleared by the [place] FDA and has been authorized for detection and/or diagnosis of SARS-CoV-2 by FDA under an Emergency Use Authorization (EUA). This EUA will remain in effect (meaning this test can be used) for the duration of the COVID-19 declaration under Section 564(b)(1) of the Act, 21 U.S.C. section 360bbb-3(b)(1), unless the authorization is terminated or revoked.     Resp Syncytial Virus by PCR NEGATIVE NEGATIVE Final    Comment: (NOTE) Fact Sheet for Patients: BloggerCourse.com  Fact Sheet for Healthcare Providers: SeriousBroker.it  This test is not yet  approved or cleared by the [place] FDA and has been authorized for detection and/or diagnosis of SARS-CoV-2 by FDA under an Emergency Use Authorization (EUA). This EUA will remain in effect (meaning this test can be used) for the duration of the COVID-19 declaration under Section 564(b)(1) of the Act, 21 U.S.C. section 360bbb-3(b)(1), unless the authorization is terminated or revoked.  Performed at [facility], [address], [place], [place] [address]   Urine Culture     Status: Abnormal   Collection Time: 11/10/23  5:22 AM   Specimen: Urine, Random  Result Value Ref Range Status   Specimen Description URINE, RANDOM  Final   Special Requests   Final    NONE Reflexed from [id number] Performed at [facility], [address], [place], [place] [address]    Culture MULTIPLE SPECIES PRESENT, SUGGEST RECOLLECTION (A)  Final   Report Status 11/11/2023 FINAL  Final      Radiology Studies: PERIPHERAL VASCULAR CATHETERIZATION Result Date: 11/11/2023 Table formatting from the original result was not included. DATE OF SERVICE: 11/11/2023  PATIENT:  Tobey Bride  83 y.o. female  PRE-OPERATIVE DIAGNOSIS:  Atherosclerosis of native arteries of left lower extremity causing ulceration  POST-OPERATIVE DIAGNOSIS:  Same  PROCEDURE:  1) Ultrasound guided right common femoral artery access 2) Aortogram 3) Left lower extremity angiogram with second order cannulation (30mL total contrast)   SURGEON:  Rande Brunt. Lenell Antu, MD  ASSISTANT: none  ANESTHESIA:   local  ESTIMATED BLOOD LOSS: minimal  LOCAL MEDICATIONS USED:  LIDOCAINE  COUNTS: confirmed correct.  PATIENT DISPOSITION:  PACU - hemodynamically stable.  Delay start of Pharmacological VTE agent (>24hrs) due to surgical blood loss or risk of bleeding: no  INDICATION FOR PROCEDURE: ATALYA DANO is a 83 y.o. female with left lower extremity ischemic ulceration. After careful discussion of risks, benefits, and alternatives the patient was  offered angiography. The patient's family understood and wished to proceed.  OPERATIVE FINDINGS:  Left renal artery: patent Right renal artery: patent Infrarenal aorta: patent Left common iliac artery: patent; no stenosis as suggested by ultrasound Right common iliac artery: patent Left internal iliac artery: patent Right internal iliac artery: patent Left external iliac artery: patent Right external iliac artery: patent Left common femoral artery: focal occlusion about takeoff of first profunda, extending through the takeoff of secondary profunda and SFA Right common femoral artery: not studied Left profunda femoris artery: two profundas seen beyond CFA occlusion reconstituting via pelvic collaterals Right profunda femoris  artery: not studied Left superficial femoral artery: patent immediately beyond origin / CFA occlusion; patent to knee Right superficial femoral artery: not studied Left popliteal artery: patent Right popliteal artery: not studied Left anterior tibial artery: patent Right anterior tibial artery: not studied Left tibioperoneal trunk: patent Right tibioperoneal trunk: not studied Left peroneal artery: patent Right peroneal artery: not studied Left posterior tibial artery: patent Right posterior tibial artery: not studied Left pedal circulation: not well studied in effort to limit contrast Right pedal circulation: not studied  GLASS score. N/A CFA disease  WIfI score. 2 / 3 / 0. Stage IV.  DESCRIPTION OF PROCEDURE: After identification of the patient in the pre-operative holding area, the patient was transferred to the operating room. The patient was positioned supine on the operating room table. The groins was prepped and draped in standard fashion. A surgical pause was performed confirming correct patient, procedure, and operative location.  The right groin was anesthetized with subcutaneous injection of 1% lidocaine. Using ultrasound guidance, the right common femoral artery was accessed with  micropuncture technique. Fluoroscopy was used to confirm cannulation over the femoral head. The 59F micropuncture sheath was upsized to 72F.  A Benson wire was advanced into the distal aorta. Over the wire an omni flush catheter was advanced to the level of L2. Aortogram was performed - see above for details.  The left common iliac artery was selected with an omniflush catheter and glidewire guidewire. The wire was advanced into the common femoral artery. Over the wire the omni flush catheter was advanced into the external iliac artery. Selective angiography was performed - see above for details.  The sheath was left in place to be removed in the recovery area.  Upon completion of the case instrument and sharps counts were confirmed correct. The patient was transferred to the PACU in good condition. I was present for all portions of the procedure.  PLAN: ASA / Statin. BMT for NSTEMI. Needs L CFA endarterectomy for limb salvage. Will need to strategize with cardiology to optimize her for surgery.  Rande Brunt. Lenell Antu, MD [facility] Phone Number: [phone] 11/11/2023 3:13 PM   ECHOCARDIOGRAM COMPLETE Result Date: 11/10/2023    ECHOCARDIOGRAM REPORT   Patient Name:   RAIA AMICO Date of Exam: 11/10/2023 Medical Rec #:  [id number]          Height:       60.0 in Accession #:    [id number]         Weight:       189.0 lb Date of Birth:  1941-04-24          BSA:          1.822 m Patient Age:    82 years           BP:           134/73 mmHg Patient Gender: F                  HR:           95 bpm. Exam Location:  Inpatient Procedure: Cardiac Doppler, Color Doppler and 2D Echo (Both Spectral and Color            Flow Doppler were utilized during procedure). Indications:    CHF  History:        Patient has prior history of Echocardiogram examinations, most  recent 01/05/2022. CAD, TIA, Aortic Valve Disease,                 Arrythmias:Atrial Fibrillation; Risk  Factors:Hypertension and                 Diabetes.  Sonographer:    Amy Chionchio Referring Phys: [id number] RONDELL A SMITH IMPRESSIONS  1. Left ventricular ejection fraction, by estimation, is 60 to 65%. The left ventricle has normal function. Left ventricular endocardial border not optimally defined to evaluate regional wall motion. Left ventricular diastolic function could not be evaluated.  2. Right ventricular systolic function is normal. The right ventricular size is normal. There is severely elevated pulmonary artery systolic pressure. The estimated right ventricular systolic pressure is 63.1 mmHg.  3. Right atrial size was mildly dilated.  4. The mitral valve is degenerative. Trivial mitral valve regurgitation. No evidence of mitral stenosis. The mean mitral valve gradient is 4.0 mmHg.  5. The aortic valve is calcified. Aortic valve regurgitation is mild. Aortic valve mean gradient measures 18.0 mmHg. Aortic valve Vmax measures 2.70 m/s. Although the mean AVG and Vmax are more consistent with mild AS, the SVI is low at 20 and DVI very low at 0.24. Findings most consistent with low flow low gradient severe AS.  6. The inferior vena cava is normal in size with <50% respiratory variability, suggesting right atrial pressure of 8 mmHg. FINDINGS  Left Ventricle: Left ventricular ejection fraction, by estimation, is 60 to 65%. The left ventricle has normal function. Left ventricular endocardial border not optimally defined to evaluate regional wall motion. The left ventricular internal cavity size was normal in size. There is no left ventricular hypertrophy. Left ventricular diastolic function could not be evaluated. Right Ventricle: The right ventricular size is normal. No increase in right ventricular wall thickness. Right ventricular systolic function is normal. There is severely elevated pulmonary artery systolic pressure. The tricuspid regurgitant velocity is 3.71 m/s, and with an assumed right atrial pressure  of 8 mmHg, the estimated right ventricular systolic pressure is 63.1 mmHg. Left Atrium: Left atrial size was normal in size. Right Atrium: Right atrial size was mildly dilated. Pericardium: There is no evidence of pericardial effusion. Mitral Valve: The mitral valve is degenerative in appearance. There is mild thickening of the mitral valve leaflet(s). There is mild calcification of the mitral valve leaflet(s). Mild to moderate mitral annular calcification. Trivial mitral valve regurgitation. No evidence of mitral valve stenosis. MV peak gradient, 8.1 mmHg. The mean mitral valve gradient is 4.0 mmHg. Tricuspid Valve: The tricuspid valve is normal in structure. Tricuspid valve regurgitation is mild . No evidence of tricuspid stenosis. Aortic Valve: The aortic valve is calcified. Aortic valve regurgitation is mild. Aortic regurgitation PHT measures 429 msec. Severe aortic stenosis is present. Aortic valve mean gradient measures 18.0 mmHg. Aortic valve peak gradient measures 29.2 mmHg. Aortic valve area, by VTI measures 0.68 cm. Pulmonic Valve: The pulmonic valve was normal in structure. Pulmonic valve regurgitation is not visualized. No evidence of pulmonic stenosis. Aorta: The aortic root is normal in size and structure. Venous: The inferior vena cava is normal in size with less than 50% respiratory variability, suggesting right atrial pressure of 8 mmHg. IAS/Shunts: No atrial level shunt detected by color flow Doppler.  LEFT VENTRICLE PLAX 2D LVIDd:         4.00 cm LVIDs:         3.50 cm LV PW:         1.10 cm  LV IVS:        1.10 cm LVOT diam:     1.90 cm LV SV:         37 LV SV Index:   20 LVOT Area:     2.84 cm  LV Volumes (MOD) LV vol d, MOD A2C: 57.7 ml LV vol d, MOD A4C: 67.6 ml LV vol s, MOD A2C: 20.7 ml LV vol s, MOD A4C: 28.1 ml LV SV MOD A2C:     37.0 ml LV SV MOD A4C:     67.6 ml LV SV MOD BP:      39.6 ml RIGHT VENTRICLE          IVC RV Basal diam:  3.80 cm  IVC diam: 2.00 cm RV Mid diam:    3.10 cm  TAPSE (M-mode): 1.3 cm LEFT ATRIUM             Index        RIGHT ATRIUM           Index LA Vol (A2C):   46.2 ml 25.38 ml/m  RA Area:     19.10 cm LA Vol (A4C):   46.8 ml 25.68 ml/m  RA Volume:   56.10 ml  30.79 ml/m LA Biplane Vol: 50.5 ml 27.71 ml/m  AORTIC VALVE                     PULMONIC VALVE AV Area (Vmax):    0.79 cm      PV Vmax:       0.93 m/s AV Area (Vmean):   0.73 cm      PV Peak grad:  3.4 mmHg AV Area (VTI):     0.68 cm AV Vmax:           270.00 cm/s AV Vmean:          198.000 cm/s AV VTI:            0.538 m AV Peak Grad:      29.2 mmHg AV Mean Grad:      18.0 mmHg LVOT Vmax:         75.00 cm/s LVOT Vmean:        51.300 cm/s LVOT VTI:          0.129 m LVOT/AV VTI ratio: 0.24 AI PHT:            429 msec  AORTA Ao Root diam: 2.40 cm Ao Asc diam:  2.90 cm MITRAL VALVE              TRICUSPID VALVE MV Area (PHT): 2.37 cm   TR Peak grad:   55.1 mmHg MV Area VTI:   1.45 cm   TR Vmax:        371.00 cm/s MV Peak grad:  8.1 mmHg MV Mean grad:  4.0 mmHg   SHUNTS MV Vmax:       1.42 m/s   Systemic VTI:  0.13 m MV Vmean:      90.7 cm/s  Systemic Diam: 1.90 cm Armanda Magic MD Electronically signed by Armanda Magic MD Signature Date/Time: 11/10/2023/5:01:30 PM    Final    CARDIAC CATHETERIZATION Result Date: 11/10/2023   Ost RCA to Prox RCA lesion is 70% stenosed.   Prox RCA to Mid RCA lesion is 40% stenosed.   Ost LM to Mid LM lesion is 70% stenosed.   Prox Cx lesion is 90% stenosed.   Prox Cx to Mid Cx lesion is 40%  stenosed.   Dist Cx lesion is 60% stenosed.   Prox LAD lesion is 70% stenosed. Heavily calcified left main with 60-70% eccentric mid stenosis The LAD is large caliber vessel that courses to the apex. Severe heavily calcified proximal LAD stenosis The large caliber Circumflex is heavily calcified in the proximal and mid vessel. There is severe, focal lesion in the mid Circumflex and a moderate stenosis in the distal AV groove Circumflex The large, dominant RCA is heavily calcified. Severe  ostial stenosis LVEDP=5-8 mmHg Recommendations: Severe left main and three vessel CAD in elderly patient with dementia. I would not recommend PCI as a treatment option and would favor medical management of her CAD. She is not a bypass candidate. Would attempt to rate control her atrial fibrillation. Hopefully she will have percutaneous options for treatment of her ischemic left leg. Continue ASA. I would load with Plavix if ok with the Vascular team following her diagnostic PV study tomorrow.       LOS: 2 days   Plez Belton Rito Ehrlich  [facility] Pager on [email or web address]  11/12/2023, 10:35 AM

## 2023-11-13 ENCOUNTER — Inpatient Hospital Stay (HOSPITAL_COMMUNITY)

## 2023-11-13 DIAGNOSIS — N179 Acute kidney failure, unspecified: Secondary | ICD-10-CM | POA: Diagnosis not present

## 2023-11-13 DIAGNOSIS — F0390 Unspecified dementia without behavioral disturbance: Secondary | ICD-10-CM | POA: Diagnosis not present

## 2023-11-13 DIAGNOSIS — I739 Peripheral vascular disease, unspecified: Secondary | ICD-10-CM | POA: Diagnosis not present

## 2023-11-13 DIAGNOSIS — I4819 Other persistent atrial fibrillation: Secondary | ICD-10-CM | POA: Diagnosis not present

## 2023-11-13 LAB — GLUCOSE, CAPILLARY
Glucose-Capillary: 97 mg/dL (ref 70–99)
Glucose-Capillary: 99 mg/dL (ref 70–99)
Glucose-Capillary: 132 mg/dL — ABNORMAL HIGH (ref 70–99)
Glucose-Capillary: 220 mg/dL — ABNORMAL HIGH (ref 70–99)

## 2023-11-13 LAB — APTT: aPTT: 123 s — ABNORMAL HIGH (ref 24–36)

## 2023-11-13 LAB — CBC
HCT: 31 % — ABNORMAL LOW (ref 36.0–46.0)
Hemoglobin: 9.9 g/dL — ABNORMAL LOW (ref 12.0–15.0)
MCH: 27.2 pg (ref 26.0–34.0)
MCHC: 31.9 g/dL (ref 30.0–36.0)
MCV: 85.2 fL (ref 80.0–100.0)
Platelets: 157 10*3/uL (ref 150–400)
RBC: 3.64 MIL/uL — ABNORMAL LOW (ref 3.87–5.11)
RDW: 15.9 % — ABNORMAL HIGH (ref 11.5–15.5)
WBC: 6.9 10*3/uL (ref 4.0–10.5)
nRBC: 0 % (ref 0.0–0.2)

## 2023-11-13 LAB — BASIC METABOLIC PANEL
Anion gap: 11 (ref 5–15)
BUN: 36 mg/dL — ABNORMAL HIGH (ref 8–23)
CO2: 20 mmol/L — ABNORMAL LOW (ref 22–32)
Calcium: 7.8 mg/dL — ABNORMAL LOW (ref 8.9–10.3)
Chloride: 105 mmol/L (ref 98–111)
Creatinine, Ser: 1.73 mg/dL — ABNORMAL HIGH (ref 0.44–1.00)
GFR, Estimated: 29 mL/min — ABNORMAL LOW (ref >60)
Glucose, Bld: 95 mg/dL (ref 70–99)
Potassium: 3.7 mmol/L (ref 3.5–5.1)
Sodium: 136 mmol/L (ref 135–145)

## 2023-11-13 LAB — HEPARIN LEVEL (UNFRACTIONATED): Heparin Unfractionated: 0.64 [IU]/mL (ref 0.30–0.70)

## 2023-11-13 MED ORDER — HEPARIN (PORCINE) 25000 UT/250ML-% IV SOLN
900.0000 [IU]/h | INTRAVENOUS | Status: AC
  Administered 2023-11-13: 1100 [IU]/h via INTRAVENOUS
  Filled 2023-11-13: qty 250

## 2023-11-13 MED ORDER — SODIUM CHLORIDE 0.9 % IV SOLN: INTRAVENOUS | Status: AC

## 2023-11-13 MED ORDER — INSULIN NPH (HUMAN) (ISOPHANE) 100 UNIT/ML ~~LOC~~ SUSP
10.0000 [IU] | Freq: Two times a day (BID) | SUBCUTANEOUS | Status: DC
  Administered 2023-11-13 – 2023-11-17 (×8): 10 [IU] via SUBCUTANEOUS
  Filled 2023-11-13: qty 10

## 2023-11-13 NOTE — Progress Notes (Signed)
 Patient ID: Tina Hester, female   DOB: [birth year], 83 y.o.   MRN: [id number]  Discussed case in detail with the patient's daughter, [name].  She did record our conversation so she can share with her brothers, which I was happy for her to do.  I explained to her that there are no good options available to [place] at the moment.  A minimally invasive repair of her common femoral artery occlusion would not likely be successful given the bulky calcific disease and the need to manage multiple branches in the common femoral artery.  Common femoral endarterectomy would be preferred to try to treat this disease, but would carry with it a significant perioperative risk of recurrent MI or death given her recent NSTEMI.  While there are techniques that we can use to lower her cardiac risk, there is nothing we can do invasively, per cardiology team, to make that risk as low as we would typically accept.    Palliative care is an option that is probably most appropriate in this case.  [name] has some understanding of this as one of her sister-in-law's entered hospice care at the end of her life.  I encouraged [name] to discuss this with her brothers.  I will check in with them again tomorrow.  Rande Brunt. Lenell Antu, MD [facility] Phone Number: [phone] 11/13/2023 1:50 PM

## 2023-11-13 NOTE — Progress Notes (Signed)
 [facility] PROGRESS NOTE   Tina Hester ZOX:[id number] DOB: April 21, 1941 DOA: 11/10/2023  PCP: [name], MD  Brief History: 83 y.o. female with medical history significant of hypertension, hyperlipidemia, persistent atrial fibrillation, peripheral vascular disease, diabetes mellitus type 2, hypothyroidism, dementia, and GERD who presents with complaints of chest pain and shortness of breath.  Patient recently developed an ulcer in the left leg and was seen by vascular surgery recently with plans for an angiogram on 3/14.  Patient was found to have elevated troponin levels.  Patient was seen by cardiology.  Patient was hospitalized for further management.   Consultants: Cardiology.  Vascular surgery.  Procedures: Lower extremity angiogram 3/14.  Cardiac catheterization 3/13.    Subjective/Interval History: Patient denies any significant pain in the left leg this morning.  No chest pain or shortness of breath.   Assessment/Plan:  NSTEMI Seen by cardiology.  Underwent cardiac catheterization which showed significant coronary artery disease but not thought to be a candidate for any intervention.  Medical management is recommended. Patient is on aspirin beta-blockers Zetia and statin.  LDL is noted to be 56.  Concern for CHF/acute respiratory failure with hypoxia She was noted to have saturations of 89% on room air.  Placed on oxygen.  Echocardiogram shows LVEF of 60 to 65%.   Patient was started on twice daily furosemide by cardiology.   Furosemide to be held due to elevated creatinine.    Acute on chronic kidney disease stage IIIb-abnormal UA/concern for UTI/hypokalemia Baseline creatinine is around 1.4.  Creatinine worsened over the last 2 to 3 days.  Diuretics were placed on hold.   She did receive contrast for both cardiac catheterization as well as lower extremity angiogram.   Oral intake seemed to be poor.  She was gently hydrated.  Creatinine is 1.72 this  morning.  Continue gentle hydration for 12 more hours. Continue to monitor urine output.  UA was done on 3/13 which showed large leukocytes with few bacteria and greater than 50 WBC.  She was started on ceftriaxone.   Potassium level has improved.  Continue to monitor daily for now.  Left leg ulcer/peripheral artery disease Developed ulcer in the left lateral calf area about 2 to 3 weeks ago.  Was seen by vascular surgery recently.  Recent ABIs noted with concern raised for left lower extremity ischemia.   Seen by vascular surgery.  Underwent lower extremity angiogram on 3/14.  CFA occlusion was noted.  Ideally needs endarterectomy.  Vascular surgery has communicated with cardiology and plans to communicate with family to decide next steps.  Permanent atrial fibrillation Anticoagulated with Eliquis which is currently on hold for procedures.  Noted to be on metoprolol which is being continued.  Not on IV heparin due to drop in hemoglobin.  Hemoglobin noted to be stable this morning.  Can initiate heparin today.  Diabetes mellitus type 2, uncontrolled with hyperglycemia Last HbA1c is 8 from September.  Noted to be on Jardiance and NPH at home.  This is being continued.  Monitor CBGs.  SSI.  Essential hypertension Was noted to have low blood pressures.  Was on metoprolol 4 times a day which was decreased to twice daily.  Blood pressure seems to have improved.  Continue to monitor.  Hyperlipidemia Continue statin and Zetia.  Was not on statin prior to admission.  History of dementia Continue donepezil.  Hypothyroidism Continue levothyroxine.  Obesity Estimated body mass index is 34.83 kg/m as calculated from the following:   Height  as of this encounter: 5' (1.524 m).   Weight as of this encounter: 80.9 kg.  DVT Prophylaxis: Was on apixaban prior to admission which is currently on hold.  Will initiate IV heparin today. Code Status: Full code Family Communication:  No family at  bedside Disposition Plan: To be determined.  Waiting on resolution regarding vascular procedures.     Medications: Scheduled:  aspirin EC  81 mg Oral Daily   donepezil  5 mg Oral QHS   ezetimibe  10 mg Oral QPM   gabapentin  300 mg Oral QHS   insulin aspart  0-9 Units Subcutaneous TID WC   insulin NPH Human  10 Units Subcutaneous BID AC & HS   levothyroxine  150 mcg Oral Q0600   metoprolol tartrate  25 mg Oral BID   rosuvastatin  20 mg Oral Daily   sertraline  50 mg Oral Daily   sodium chloride flush  3 mL Intravenous Q12H   Continuous:  sodium chloride     cefTRIAXone (ROCEPHIN)  IV 1 g (11/12/23 1459)   NWG:NFAOZHYQMVHQI, ALPRAZolam, HYDROcodone-acetaminophen, ondansetron (ZOFRAN) IV, sodium chloride flush   Objective:  Vital Signs  Vitals:   11/12/23 2000 11/13/23 0000 11/13/23 0315 11/13/23 0400  BP: (!) 105/41 123/69  (!) 107/49  Pulse: 92 (!) 104  94  Resp: 20 18 14 16   Temp: 98.1 F (36.7 C) 98.3 F (36.8 C)  98.1 F (36.7 C)  TempSrc: Oral Oral  Oral  SpO2: 94% 96%  94%  Weight:   80.9 kg   Height:        Intake/Output Summary (Last 24 hours) at 11/13/2023 0921 Last data filed at 11/12/2023 1300 Gross per 24 hour  Intake 360 ml  Output --  Net 360 ml   Filed Weights   11/11/23 0419 11/12/23 0512 11/13/23 0315  Weight: 83.2 kg 78.3 kg 80.9 kg    General appearance: Awake alert.  In no distress Resp: Clear to auscultation bilaterally.  Normal effort Cardio: S1-S2 is normal regular.  No S3-S4.  No rubs murmurs or bruit GI: Abdomen is soft.  Nontender nondistended.  Bowel sounds are present normal.  No masses organomegaly Extremities: No edema.  Full range of motion of lower extremities. Neurologic: No obvious focal neurological deficits  Lab Results:  Data Reviewed: I have personally reviewed following labs and reports of the imaging studies  CBC: Recent Labs  Lab 11/10/23 0138 11/11/23 0302 11/12/23 0351 11/13/23 0824  WBC 10.5 8.4 7.2  6.9  NEUTROABS 8.4*  --   --   --   HGB 12.0 11.0* 9.9* 9.9*  HCT 38.7 35.3* 31.7* 31.0*  MCV 88.6 86.5 86.8 85.2  PLT 181 168 152 157    Basic Metabolic Panel: Recent Labs  Lab 11/10/23 0138 11/11/23 0302 11/12/23 0351 11/13/23 0824  NA 137 136 137 136  K 4.4 3.9 3.4* 3.7  CL 106 103 103 105  CO2 20* 23 23 20*  GLUCOSE 232* 140* 159* 95  BUN 21 26* 33* 36*  CREATININE 1.12* 1.58* 1.79* 1.73*  CALCIUM 8.9 8.3* 7.7* 7.8*    GFR: Estimated Creatinine Clearance: 23.6 mL/min (A) (by C-G formula based on SCr of 1.73 mg/dL (H)).   CBG: Recent Labs  Lab 11/12/23 0545 11/12/23 1116 11/12/23 1705 11/12/23 2056 11/13/23 0613  GLUCAP 186* 123* 126* 183* 97     Recent Results (from the past 240 hours)  Resp panel by RT-PCR (RSV, Flu A&B, Covid) Anterior Nasal Swab  Status: None   Collection Time: 11/10/23  1:42 AM   Specimen: Anterior Nasal Swab  Result Value Ref Range Status   SARS Coronavirus 2 by RT PCR NEGATIVE NEGATIVE Final   Influenza A by PCR NEGATIVE NEGATIVE Final   Influenza B by PCR NEGATIVE NEGATIVE Final    Comment: (NOTE) The Xpert Xpress SARS-CoV-2/FLU/RSV plus assay is intended as an aid in the diagnosis of influenza from Nasopharyngeal swab specimens and should not be used as a sole basis for treatment. Nasal washings and aspirates are unacceptable for Xpert Xpress SARS-CoV-2/FLU/RSV testing.  Fact Sheet for Patients: BloggerCourse.com  Fact Sheet for Healthcare Providers: SeriousBroker.it  This test is not yet approved or cleared by the [place] FDA and has been authorized for detection and/or diagnosis of SARS-CoV-2 by FDA under an Emergency Use Authorization (EUA). This EUA will remain in effect (meaning this test can be used) for the duration of the COVID-19 declaration under Section 564(b)(1) of the Act, 21 U.S.C. section 360bbb-3(b)(1), unless the authorization is terminated  or revoked.     Resp Syncytial Virus by PCR NEGATIVE NEGATIVE Final    Comment: (NOTE) Fact Sheet for Patients: BloggerCourse.com  Fact Sheet for Healthcare Providers: SeriousBroker.it  This test is not yet approved or cleared by the [place] FDA and has been authorized for detection and/or diagnosis of SARS-CoV-2 by FDA under an Emergency Use Authorization (EUA). This EUA will remain in effect (meaning this test can be used) for the duration of the COVID-19 declaration under Section 564(b)(1) of the Act, 21 U.S.C. section 360bbb-3(b)(1), unless the authorization is terminated or revoked.  Performed at [facility], [address] [place], [place] [address]   Urine Culture     Status: Abnormal   Collection Time: 11/10/23  5:22 AM   Specimen: Urine, Random  Result Value Ref Range Status   Specimen Description URINE, RANDOM  Final   Special Requests   Final    NONE Reflexed from [id number] Performed at [facility], [address], [place], [place] [address]    Culture MULTIPLE SPECIES PRESENT, SUGGEST RECOLLECTION (A)  Final   Report Status 11/11/2023 FINAL  Final      Radiology Studies: PERIPHERAL VASCULAR CATHETERIZATION Result Date: 11/11/2023 Table formatting from the original result was not included. DATE OF SERVICE: 11/11/2023  PATIENT:  Tina Hester  83 y.o. female  PRE-OPERATIVE DIAGNOSIS:  Atherosclerosis of native arteries of left lower extremity causing ulceration  POST-OPERATIVE DIAGNOSIS:  Same  PROCEDURE:  1) Ultrasound guided right common femoral artery access 2) Aortogram 3) Left lower extremity angiogram with second order cannulation (30mL total contrast)   SURGEON:  Rande Brunt. Lenell Antu, MD  ASSISTANT: none  ANESTHESIA:   local  ESTIMATED BLOOD LOSS: minimal  LOCAL MEDICATIONS USED:  LIDOCAINE  COUNTS: confirmed correct.  PATIENT DISPOSITION:  PACU - hemodynamically stable.  Delay start of  Pharmacological VTE agent (>24hrs) due to surgical blood loss or risk of bleeding: no  INDICATION FOR PROCEDURE: KEIANDRA SULLENGER is a 83 y.o. female with left lower extremity ischemic ulceration. After careful discussion of risks, benefits, and alternatives the patient was offered angiography. The patient's family understood and wished to proceed.  OPERATIVE FINDINGS:  Left renal artery: patent Right renal artery: patent Infrarenal aorta: patent Left common iliac artery: patent; no stenosis as suggested by ultrasound Right common iliac artery: patent Left internal iliac artery: patent Right internal iliac artery: patent Left external iliac artery: patent Right external  iliac artery: patent Left common femoral artery: focal occlusion about takeoff of first profunda, extending through the takeoff of secondary profunda and SFA Right common femoral artery: not studied Left profunda femoris artery: two profundas seen beyond CFA occlusion reconstituting via pelvic collaterals Right profunda femoris artery: not studied Left superficial femoral artery: patent immediately beyond origin / CFA occlusion; patent to knee Right superficial femoral artery: not studied Left popliteal artery: patent Right popliteal artery: not studied Left anterior tibial artery: patent Right anterior tibial artery: not studied Left tibioperoneal trunk: patent Right tibioperoneal trunk: not studied Left peroneal artery: patent Right peroneal artery: not studied Left posterior tibial artery: patent Right posterior tibial artery: not studied Left pedal circulation: not well studied in effort to limit contrast Right pedal circulation: not studied  GLASS score. N/A CFA disease  WIfI score. 2 / 3 / 0. Stage IV.  DESCRIPTION OF PROCEDURE: After identification of the patient in the pre-operative holding area, the patient was transferred to the operating room. The patient was positioned supine on the operating room table. The groins was prepped and  draped in standard fashion. A surgical pause was performed confirming correct patient, procedure, and operative location.  The right groin was anesthetized with subcutaneous injection of 1% lidocaine. Using ultrasound guidance, the right common femoral artery was accessed with micropuncture technique. Fluoroscopy was used to confirm cannulation over the femoral head. The 64F micropuncture sheath was upsized to 51F.  A Benson wire was advanced into the distal aorta. Over the wire an omni flush catheter was advanced to the level of L2. Aortogram was performed - see above for details.  The left common iliac artery was selected with an omniflush catheter and glidewire guidewire. The wire was advanced into the common femoral artery. Over the wire the omni flush catheter was advanced into the external iliac artery. Selective angiography was performed - see above for details.  The sheath was left in place to be removed in the recovery area.  Upon completion of the case instrument and sharps counts were confirmed correct. The patient was transferred to the PACU in good condition. I was present for all portions of the procedure.  PLAN: ASA / Statin. BMT for NSTEMI. Needs L CFA endarterectomy for limb salvage. Will need to strategize with cardiology to optimize her for surgery.  Rande Brunt. Lenell Antu, MD [facility] Phone Number: [phone] 11/11/2023 3:13 PM       LOS: 3 days   Osvaldo Shipper  [facility] Pager on [email or web address]  11/13/2023, 9:21 AM

## 2023-11-13 NOTE — Progress Notes (Addendum)
 PHARMACY - ANTICOAGULATION CONSULT NOTE  Pharmacy Consult for Heparin Indication: atrial fibrillation  Allergies  Allergen Reactions   Pneumococcal Vaccine Anaphylaxis    weakness   Sulfa Antibiotics Hives   Pregabalin Other (See Comments)    Other reaction(s): depression, patient unsure    Patient Measurements: Height: 5' (152.4 cm) Weight: 80.9 kg (178 lb 5.6 oz) IBW/kg (Calculated) : 45.5 Heparin Dosing Weight: 65.5kg  Vital Signs: Temp: 98.1 F (36.7 C) (03/16 0938) Temp Source: Oral (03/16 0938) BP: 122/49 (03/16 0938) Pulse Rate: 93 (03/16 0938)  Labs: Recent Labs    11/10/23 1230 11/11/23 0302 11/11/23 0302 11/12/23 0351 11/13/23 0824  HGB  --  11.0*   < > 9.9* 9.9*  HCT  --  35.3*  --  31.7* 31.0*  PLT  --  168  --  152 157  APTT 94*  --   --   --   --   HEPARINUNFRC >1.10*  --   --   --   --   CREATININE  --  1.58*  --  1.79* 1.73*   < > = values in this interval not displayed.    Estimated Creatinine Clearance: 23.6 mL/min (A) (by C-G formula based on SCr of 1.73 mg/dL (H)).   Medical History: Past Medical History:  Diagnosis Date   Anemia    s/p Heme work -up normal EGD and colonscopy in 2012 per pt,neg SPEP   Anxiety    Aortic stenosis    Mild by echo 12/2021 with mean aortic valve gradient 17 mmHg   Bilateral carotid artery stenosis 06/08/2015   1-39% bilateral   Bradycardia    CKD (chronic kidney disease), stage III (HCC)    Diabetes mellitus without complication (HCC)    type 2   GERD (gastroesophageal reflux disease)    Gout    HSV-1 (herpes simplex virus 1) infection    Acyclovir prn   Hyperkalemia    Hyperlipidemia    Hypertension    Joint pain    osteoarthritis by Xray- possible erosion of R 4th MCP,elevated uric  acid ,ANA +    MCI (mild cognitive impairment)    Morbid obesity (HCC)    OSA on CPAP    Osteopenia    PAF (paroxysmal atrial fibrillation) (HCC)    Pain management    Neurosurg: Dr Odette Fraction   Pulmonary HTN  (HCC)    Moderate by echo  2016. PASP   TIA (transient ischemic attack)    remote history of TIA's early 2000    Medications:  Scheduled:   aspirin EC  81 mg Oral Daily   donepezil  5 mg Oral QHS   ezetimibe  10 mg Oral QPM   gabapentin  300 mg Oral QHS   insulin aspart  0-9 Units Subcutaneous TID WC   insulin NPH Human  10 Units Subcutaneous BID AC & HS   levothyroxine  150 mcg Oral Q0600   metoprolol tartrate  25 mg Oral BID   rosuvastatin  20 mg Oral Daily   sertraline  50 mg Oral Daily   sodium chloride flush  3 mL Intravenous Q12H   Infusions:   sodium chloride     cefTRIAXone (ROCEPHIN)  IV 1 g (11/12/23 1459)   Assessment: 82YOF with history of atrial fibrillation on Eliquis who presented with NSTEMI and left leg ulcer. S/p lower extremity angiogram on 3/14 which anticoagulation has been on hold for. Pharmacy has been consulted to dose heparin while awaiting  further procedures.  Eliquis last dose 3/11 - will obtain aPTTs and heparin levels until correlating to ensure Eliquis is not affecting heparin level.  Hgb 9.9, plt 157 (stable). Earlier in the admission, patient was at upper end of therapeutic range on 1200 units/hr, therefore will resume at slightly lower rate.  Goal of Therapy:  Heparin level 0.3-0.7 units/ml Monitor platelets by anticoagulation protocol: Yes   Plan:  Start heparin infusion at 1100 units/hr Check anti-Xa level and aPTT in 8 hours and daily while on heparin Continue to monitor H&H and platelets F/u plans to transition back to [employer] 11/13/2023,9:59 AM

## 2023-11-13 NOTE — Progress Notes (Signed)
 PHARMACY - ANTICOAGULATION CONSULT NOTE  Pharmacy Consult for Heparin Indication: atrial fibrillation  Allergies  Allergen Reactions   Pneumococcal Vaccine Anaphylaxis    weakness   Sulfa Antibiotics Hives   Pregabalin Other (See Comments)    Other reaction(s): depression, patient unsure    Patient Measurements: Height: 5' (152.4 cm) Weight: 80.9 kg (178 lb 5.6 oz) IBW/kg (Calculated) : 45.5 Heparin Dosing Weight: 65.5kg  Vital Signs: Temp: 98.3 F (36.8 C) (03/16 1630) Temp Source: Oral (03/16 1630) BP: 129/63 (03/16 1630) Pulse Rate: 57 (03/16 1630)  Labs: Recent Labs    11/11/23 0302 11/12/23 0351 11/13/23 0824 11/13/23 1757  HGB 11.0* 9.9* 9.9*  --   HCT 35.3* 31.7* 31.0*  --   PLT 168 152 157  --   APTT  --   --   --  123*  HEPARINUNFRC  --   --   --  0.64  CREATININE 1.58* 1.79* 1.73*  --     Estimated Creatinine Clearance: 23.6 mL/min (A) (by C-G formula based on SCr of 1.73 mg/dL (H)).   Medical History: Past Medical History:  Diagnosis Date   Anemia    s/p Heme work -up normal EGD and colonscopy in 2012 per pt,neg SPEP   Anxiety    Aortic stenosis    Mild by echo 12/2021 with mean aortic valve gradient 17 mmHg   Bilateral carotid artery stenosis 06/08/2015   1-39% bilateral   Bradycardia    CKD (chronic kidney disease), stage III (HCC)    Diabetes mellitus without complication (HCC)    type 2   GERD (gastroesophageal reflux disease)    Gout    HSV-1 (herpes simplex virus 1) infection    Acyclovir prn   Hyperkalemia    Hyperlipidemia    Hypertension    Joint pain    osteoarthritis by Xray- possible erosion of R 4th MCP,elevated uric  acid ,ANA +    MCI (mild cognitive impairment)    Morbid obesity (HCC)    OSA on CPAP    Osteopenia    PAF (paroxysmal atrial fibrillation) (HCC)    Pain management    Neurosurg: Dr Odette Fraction   Pulmonary HTN (HCC)    Moderate by echo  2016. PASP   TIA (transient ischemic attack)    remote  history of TIA's early 2000    Medications:  Scheduled:   aspirin EC  81 mg Oral Daily   donepezil  5 mg Oral QHS   ezetimibe  10 mg Oral QPM   gabapentin  300 mg Oral QHS   insulin aspart  0-9 Units Subcutaneous TID WC   insulin NPH Human  10 Units Subcutaneous BID AC & HS   levothyroxine  150 mcg Oral Q0600   metoprolol tartrate  25 mg Oral BID   rosuvastatin  20 mg Oral Daily   sertraline  50 mg Oral Daily   sodium chloride flush  3 mL Intravenous Q12H   Infusions:   sodium chloride 50 mL/hr at 11/13/23 1028   cefTRIAXone (ROCEPHIN)  IV 1 g (11/13/23 1030)   heparin 1,100 Units/hr (11/13/23 1018)   Assessment: 82YOF with history of atrial fibrillation on Eliquis who presented with NSTEMI and left leg ulcer. S/p lower extremity angiogram on 3/14 which anticoagulation has been on hold for. Pharmacy has been consulted to dose heparin while awaiting further procedures.  Eliquis last dose 3/11 - will obtain aPTTs and heparin levels until correlating to ensure Eliquis is not affecting  heparin level.  Hgb 9.9, plt 157 (stable). Earlier in the admission, patient was at upper end of therapeutic range on 1200 units/hr, therefore will resume at slightly lower rate.  PM: aPTT 123 (supratherapeutic), heparin level 0.64 (therapeutic) - effects of eliquis should be diminished by now. No bleeding or issues reported per RN, and reports that lab was drawn from opposite arm as heparin infusion.  Goal of Therapy:  Heparin level 0.3-0.7 units/ml aPTT 66-102 sec Monitor platelets by anticoagulation protocol: Yes   Plan:  Decrease heparin infusion slightly to 1050 units/hr Check anti-Xa level and aPTT in 8 hours and daily while on heparin Continue to monitor H&H and platelets F/u plans to transition back to Eliquis  Loralee Pacas, PharmD, BCPS 11/13/2023,6:57 PM  Please check AMION for all [facility] Pharmacy phone numbers After 10:00 PM, call Main Pharmacy [phone]

## 2023-11-14 ENCOUNTER — Encounter (HOSPITAL_COMMUNITY): Payer: Self-pay | Admitting: Vascular Surgery

## 2023-11-14 DIAGNOSIS — I739 Peripheral vascular disease, unspecified: Secondary | ICD-10-CM | POA: Diagnosis not present

## 2023-11-14 DIAGNOSIS — I4819 Other persistent atrial fibrillation: Secondary | ICD-10-CM | POA: Diagnosis not present

## 2023-11-14 DIAGNOSIS — N179 Acute kidney failure, unspecified: Secondary | ICD-10-CM | POA: Diagnosis not present

## 2023-11-14 DIAGNOSIS — F0390 Unspecified dementia without behavioral disturbance: Secondary | ICD-10-CM | POA: Diagnosis not present

## 2023-11-14 DIAGNOSIS — I70245 Atherosclerosis of native arteries of left leg with ulceration of other part of foot: Secondary | ICD-10-CM | POA: Diagnosis not present

## 2023-11-14 LAB — BASIC METABOLIC PANEL
Anion gap: 10 (ref 5–15)
BUN: 30 mg/dL — ABNORMAL HIGH (ref 8–23)
CO2: 21 mmol/L — ABNORMAL LOW (ref 22–32)
Calcium: 7.9 mg/dL — ABNORMAL LOW (ref 8.9–10.3)
Chloride: 106 mmol/L (ref 98–111)
Creatinine, Ser: 1.39 mg/dL — ABNORMAL HIGH (ref 0.44–1.00)
GFR, Estimated: 38 mL/min — ABNORMAL LOW (ref >60)
Glucose, Bld: 127 mg/dL — ABNORMAL HIGH (ref 70–99)
Potassium: 4.1 mmol/L (ref 3.5–5.1)
Sodium: 137 mmol/L (ref 135–145)

## 2023-11-14 LAB — CBC
HCT: 32 % — ABNORMAL LOW (ref 36.0–46.0)
Hemoglobin: 10.1 g/dL — ABNORMAL LOW (ref 12.0–15.0)
MCH: 26.9 pg (ref 26.0–34.0)
MCHC: 31.6 g/dL (ref 30.0–36.0)
MCV: 85.1 fL (ref 80.0–100.0)
Platelets: 174 10*3/uL (ref 150–400)
RBC: 3.76 MIL/uL — ABNORMAL LOW (ref 3.87–5.11)
RDW: 15.8 % — ABNORMAL HIGH (ref 11.5–15.5)
WBC: 6.9 10*3/uL (ref 4.0–10.5)
nRBC: 0 % (ref 0.0–0.2)

## 2023-11-14 LAB — GLUCOSE, CAPILLARY
Glucose-Capillary: 109 mg/dL — ABNORMAL HIGH (ref 70–99)
Glucose-Capillary: 134 mg/dL — ABNORMAL HIGH (ref 70–99)
Glucose-Capillary: 87 mg/dL (ref 70–99)
Glucose-Capillary: 117 mg/dL — ABNORMAL HIGH (ref 70–99)

## 2023-11-14 LAB — HEPARIN LEVEL (UNFRACTIONATED): Heparin Unfractionated: 0.77 [IU]/mL — ABNORMAL HIGH (ref 0.30–0.70)

## 2023-11-14 MED ORDER — HYDROMORPHONE HCL 1 MG/ML IJ SOLN
0.5000 mg | INTRAMUSCULAR | Status: DC | PRN
  Administered 2023-11-14 (×3): 0.5 mg via INTRAVENOUS
  Filled 2023-11-14 (×3): qty 0.5

## 2023-11-14 MED ORDER — APIXABAN 5 MG PO TABS
5.0000 mg | ORAL_TABLET | Freq: Two times a day (BID) | ORAL | Status: DC
  Administered 2023-11-14 – 2023-11-17 (×7): 5 mg via ORAL
  Filled 2023-11-14 (×7): qty 1

## 2023-11-14 NOTE — Progress Notes (Signed)
 [facility] PROGRESS NOTE   SHABRE KREHER ZOX:[id number] DOB: 1940-12-28 DOA: 11/10/2023  PCP: [name], MD  Brief History: 83 y.o. female with medical history significant of hypertension, hyperlipidemia, persistent atrial fibrillation, peripheral vascular disease, diabetes mellitus type 2, hypothyroidism, dementia, and GERD who presents with complaints of chest pain and shortness of breath.  Patient recently developed an ulcer in the left leg and was seen by vascular surgery recently with plans for an angiogram on 3/14.  Patient was found to have elevated troponin levels.  Patient was seen by cardiology.  Patient was hospitalized for further management.   Consultants: Cardiology.  Vascular surgery.  Procedures: Lower extremity angiogram 3/14.  Cardiac catheterization 3/13.    Subjective/Interval History: Patient complains of pain in the left lower extremity.  Denies any chest pain or shortness of breath.  Slept poorly as a result of the pain.     Assessment/Plan:  NSTEMI Seen by cardiology.  Underwent cardiac catheterization which showed significant coronary artery disease but not thought to be a candidate for any intervention.  Medical management is recommended. Patient is on aspirin beta-blockers Zetia and statin.  LDL is noted to be 56.  Acute respiratory failure with hypoxia She was noted to have saturations of 89% on room air.  Placed on oxygen.  Echocardiogram shows LVEF of 60 to 65%.   Patient was started on twice daily furosemide by cardiology.   Furosemide held due to elevated creatinine.   Noted to have tachypnea overnight.  Was also tachycardic at the same time.  Chest x-ray did not show any acute findings.  Patient was placed on oxygen with improvement in symptoms and heart rate. Improvement in creatinine noted.  Acute on chronic kidney disease stage IIIb-abnormal UA/concern for UTI/hypokalemia Baseline creatinine is around 1.4.  Creatinine worsened  over the last 2 to 3 days.  Diuretics were placed on hold.   She did receive contrast for both cardiac catheterization as well as lower extremity angiogram.   Oral intake seemed to be poor.  She was gently hydrated.   Creatinine gradually improved and now back to baseline.   UA was done on 3/13 which showed large leukocytes with few bacteria and greater than 50 WBC.  She was started on ceftriaxone.  Will give a 5-day course. Potassium level has improved.  Continue to monitor daily for now.  Left leg ulcer/peripheral artery disease Developed ulcer in the left lateral calf area about 2 to 3 weeks ago.  Was seen by vascular surgery recently.  Recent ABIs noted with concern raised for left lower extremity ischemia.   Seen by vascular surgery.  Underwent lower extremity angiogram on 3/14.  CFA occlusion was noted.  Ideally needs endarterectomy would be a high risk procedure due to her other comorbidities. Vascular surgery had multiple discussions with patient and family.  At this time they would like to hold off on any procedures.  Palliative care to be consulted for goals of care conversation and consideration of hospice.    Permanent atrial fibrillation Anticoagulated with Eliquis which is currently on hold for procedures.   Patient is on metoprolol for rate control.  Had tachyarrhythmia overnight which appears to have improved.  Currently on IV heparin.  Since no procedure is being planned we will transition her back to apixaban.    Diabetes mellitus type 2, uncontrolled with hyperglycemia Last HbA1c is 8 from September.  Noted to be on Jardiance and NPH at home.  This is being continued.  Monitor CBGs.  SSI.  Essential hypertension Was noted to have low blood pressures.  Was on metoprolol 4 times a day which was decreased to twice daily.  Blood pressure seems to have improved.  Continue to monitor.  Hyperlipidemia Continue statin and Zetia.  Was not on statin prior to admission.  History of  dementia Continue donepezil.  Hypothyroidism Continue levothyroxine.  Obesity Estimated body mass index is 35.31 kg/m as calculated from the following:   Height as of this encounter: 5' (1.524 m).   Weight as of this encounter: 82 kg.  DVT Prophylaxis: Resume apixaban Code Status: Full code Family Communication:  No family at bedside Disposition Plan: To be determined.       Medications: Scheduled:  aspirin EC  81 mg Oral Daily   donepezil  5 mg Oral QHS   ezetimibe  10 mg Oral QPM   gabapentin  300 mg Oral QHS   insulin aspart  0-9 Units Subcutaneous TID WC   insulin NPH Human  10 Units Subcutaneous BID AC & HS   levothyroxine  150 mcg Oral Q0600   metoprolol tartrate  25 mg Oral BID   rosuvastatin  20 mg Oral Daily   sertraline  50 mg Oral Daily   sodium chloride flush  3 mL Intravenous Q12H   Continuous:  cefTRIAXone (ROCEPHIN)  IV 1 g (11/13/23 1030)   heparin 900 Units/hr (11/14/23 0522)   ZOX:[id number], ALPRAZolam, HYDROcodone-acetaminophen, HYDROmorphone (DILAUDID) injection, ondansetron (ZOFRAN) IV, sodium chloride flush   Objective:  Vital Signs  Vitals:   11/14/23 0524 11/14/23 0736 11/14/23 0852 11/14/23 0857  BP: (!) 112/49 (!) 151/77  (!) 141/72  Pulse: 75 60 73 70  Resp: 16 15 14 15   Temp:  97.8 F (36.6 C)    TempSrc:  Oral    SpO2: 100% 98%    Weight:      Height:        Intake/Output Summary (Last 24 hours) at 11/14/2023 1020 Last data filed at 11/14/2023 0616 Gross per 24 hour  Intake 94.61 ml  Output 400 ml  Net -305.39 ml   Filed Weights   11/12/23 0512 11/13/23 0315 11/14/23 0500  Weight: 78.3 kg 80.9 kg 82 kg    General appearance: Awake alert.  In no distress Resp: Clear to auscultation bilaterally.  Normal effort Cardio: S1-S2 is normal regular.  No S3-S4.  No rubs murmurs or bruit GI: Abdomen is soft.  Nontender nondistended.  Bowel sounds are present normal.  No masses organomegaly   Lab Results:  Data Reviewed:  I have personally reviewed following labs and reports of the imaging studies  CBC: Recent Labs  Lab 11/10/23 0138 11/11/23 0302 11/12/23 0351 11/13/23 0824 11/14/23 0402  WBC 10.5 8.4 7.2 6.9 6.9  NEUTROABS 8.4*  --   --   --   --   HGB 12.0 11.0* 9.9* 9.9* 10.1*  HCT 38.7 35.3* 31.7* 31.0* 32.0*  MCV 88.6 86.5 86.8 85.2 85.1  PLT 181 168 152 157 174    Basic Metabolic Panel: Recent Labs  Lab 11/10/23 0138 11/11/23 0302 11/12/23 0351 11/13/23 0824 11/14/23 0402  NA 137 136 137 136 137  K 4.4 3.9 3.4* 3.7 4.1  CL 106 103 103 105 106  CO2 20* 23 23 20* 21*  GLUCOSE 232* 140* 159* 95 127*  BUN 21 26* 33* 36* 30*  CREATININE 1.12* 1.58* 1.79* 1.73* 1.39*  CALCIUM 8.9 8.3* 7.7* 7.8* 7.9*    GFR: Estimated Creatinine  Clearance: 29.6 mL/min (A) (by C-G formula based on SCr of 1.39 mg/dL (H)).   CBG: Recent Labs  Lab 11/13/23 0613 11/13/23 1141 11/13/23 1631 11/13/23 2144 11/14/23 0612  GLUCAP 97 99 132* 220* 109*     Recent Results (from the past 240 hours)  Resp panel by RT-PCR (RSV, Flu A&B, Covid) Anterior Nasal Swab     Status: None   Collection Time: 11/10/23  1:42 AM   Specimen: Anterior Nasal Swab  Result Value Ref Range Status   SARS Coronavirus 2 by RT PCR NEGATIVE NEGATIVE Final   Influenza A by PCR NEGATIVE NEGATIVE Final   Influenza B by PCR NEGATIVE NEGATIVE Final    Comment: (NOTE) The Xpert Xpress SARS-CoV-2/FLU/RSV plus assay is intended as an aid in the diagnosis of influenza from Nasopharyngeal swab specimens and should not be used as a sole basis for treatment. Nasal washings and aspirates are unacceptable for Xpert Xpress SARS-CoV-2/FLU/RSV testing.  Fact Sheet for Patients: BloggerCourse.com  Fact Sheet for Healthcare Providers: SeriousBroker.it  This test is not yet approved or cleared by the [place] FDA and has been authorized for detection and/or diagnosis of SARS-CoV-2  by FDA under an Emergency Use Authorization (EUA). This EUA will remain in effect (meaning this test can be used) for the duration of the COVID-19 declaration under Section 564(b)(1) of the Act, 21 U.S.C. section 360bbb-3(b)(1), unless the authorization is terminated or revoked.     Resp Syncytial Virus by PCR NEGATIVE NEGATIVE Final    Comment: (NOTE) Fact Sheet for Patients: BloggerCourse.com  Fact Sheet for Healthcare Providers: SeriousBroker.it  This test is not yet approved or cleared by the [place] FDA and has been authorized for detection and/or diagnosis of SARS-CoV-2 by FDA under an Emergency Use Authorization (EUA). This EUA will remain in effect (meaning this test can be used) for the duration of the COVID-19 declaration under Section 564(b)(1) of the Act, 21 U.S.C. section 360bbb-3(b)(1), unless the authorization is terminated or revoked.  Performed at [facility], [address] [place], [place] [address]   Urine Culture     Status: Abnormal   Collection Time: 11/10/23  5:22 AM   Specimen: Urine, Random  Result Value Ref Range Status   Specimen Description URINE, RANDOM  Final   Special Requests   Final    NONE Reflexed from [id number] Performed at [facility], [address], [place], [place] [address]    Culture MULTIPLE SPECIES PRESENT, SUGGEST RECOLLECTION (A)  Final   Report Status 11/11/2023 FINAL  Final      Radiology Studies: DG CHEST PORT 1 VIEW Result Date: 11/13/2023 CLINICAL DATA:  Shortness of breath EXAM: PORTABLE CHEST 1 VIEW COMPARISON:  11/10/2023 FINDINGS: Heart and mediastinal contours within normal limits. No confluent airspace opacity or effusion. No acute bony abnormality. IMPRESSION: No active disease. Electronically Signed   By: Charlett Nose M.D.   On: 11/13/2023 23:38       LOS: 4 days   Maraya Gwilliam Rito Ehrlich  [facility] Pager on [email or web address]  11/14/2023,  10:20 AM

## 2023-11-14 NOTE — Progress Notes (Signed)
 PHARMACY - ANTICOAGULATION CONSULT NOTE  Pharmacy Consult for Heparin > Eliquis Indication: atrial fibrillation  Allergies  Allergen Reactions   Pneumococcal Vaccine Anaphylaxis    weakness   Sulfa Antibiotics Hives   Pregabalin Other (See Comments)    Other reaction(s): depression, patient unsure    Patient Measurements: Height: 5' (152.4 cm) Weight: 82 kg (180 lb 12.4 oz) IBW/kg (Calculated) : 45.5 Heparin Dosing Weight: 65.5 kg  Vital Signs: Temp: 97.8 F (36.6 C) (03/17 0736) Temp Source: Oral (03/17 0736) BP: 141/72 (03/17 0857) Pulse Rate: 85 (03/17 1039)  Labs: Recent Labs    11/12/23 0351 11/13/23 0824 11/13/23 1757 11/14/23 0402  HGB 9.9* 9.9*  --  10.1*  HCT 31.7* 31.0*  --  32.0*  PLT 152 157  --  174  APTT  --   --  123*  --   HEPARINUNFRC  --   --  0.64 0.77*  CREATININE 1.79* 1.73*  --  1.39*    Estimated Creatinine Clearance: 29.6 mL/min (A) (by C-G formula based on SCr of 1.39 mg/dL (H)).  Assessment: 83YOF with history of atrial fibrillation on Eliquis who presented with NSTEMI and left leg ulcer. S/p lower extremity angiogram on 3/14 which anticoagulation had been held. Pharmacy was consulted to dose heparin while awaiting further procedures.   Heparin infusion rate increased early am due to low aPTT and next labs were due at 1pm today.  Now to transition back to Eliquis. No vascular procedure planned while recovering from NSTEMI.   83 years old, but >60 kg. Creatinine was >1.5 on 3/14 and 3/15, but trended down to 1.39 today.  Baseline < 1.5. No Eliquis dose reduction.  Goal of Therapy:  Heparin level 0.3-0.7 units/ml aPTT 66-102 seconds Appropriate Eliquis regimen for indication Monitor platelets by anticoagulation protocol: Yes   Plan:  Resume prior Eliquis 5 mg PO BID. Stop heparin drip when giving first Eliquis dose. Intermittent CBC.  Dennie Fetters, RPh 11/14/2023,11:02 AM

## 2023-11-14 NOTE — Plan of Care (Signed)
  Problem: Health Behavior/Discharge Planning: Goal: Ability to manage health-related needs will improve Outcome: Not Progressing   Problem: Nutritional: Goal: Maintenance of adequate nutrition will improve Outcome: Not Progressing Goal: Progress toward achieving an optimal weight will improve Outcome: Not Progressing

## 2023-11-14 NOTE — Progress Notes (Signed)
 PHARMACY - ANTICOAGULATION CONSULT NOTE  Pharmacy Consult for heparin Indication: atrial fibrillation  Labs: Recent Labs    11/12/23 0351 11/13/23 0824 11/13/23 1757 11/14/23 0402  HGB 9.9* 9.9*  --  10.1*  HCT 31.7* 31.0*  --  32.0*  PLT 152 157  --  174  APTT  --   --  123*  --   HEPARINUNFRC  --   --  0.64 0.77*  CREATININE 1.79* 1.73*  --  1.39*   Assessment: 82yo female supratherapeutic on heparin with higher heparin level despite decreased rate; no infusion issues or signs of bleeding per RN.  Goal of Therapy:  Heparin level 0.3-0.7 units/ml   Plan:  Decrease heparin infusion by 2 units/kg/hr to 900 units/hr. Check level in 8 hours.   Vernard Gambles, PharmD, BCPS 11/14/2023 5:12 AM

## 2023-11-14 NOTE — Progress Notes (Signed)
 Patient's HR elevated to 150s. 2 RNs heard patient screaming and found patient scooted to edge of bed. Patient moved back up in place. Rhonchi heard. Patient told to cough. Patient placed on oxygen.  Called respiratory to bedside. Crackles continued. CPAP ordered and placed on patient.  Notified Dr. Loney Loh. CXR ordered.   Patient currently resting comfortably.  Will continue to monitor

## 2023-11-14 NOTE — Progress Notes (Addendum)
  Progress Note    11/14/2023 7:11 AM 3 Days Post-Op  Subjective:  sleeping with CPAP. Wakes easily.  No complaints  afebrile  Vitals:   11/13/23 2332 11/14/23 0524  BP: (!) 88/57 (!) 112/49  Pulse: 87 75  Resp: 18 16  Temp: 97.7 F (36.5 C)   SpO2: 99% 100%    Physical Exam: General:  no distress Cardiac:  regular Lungs:  on CPAP Extremities:  brisk right PT doppler flow and monophasic doppler flow left PT   CBC    Component Value Date/Time   WBC 6.9 11/14/2023 0402   RBC 3.76 (L) 11/14/2023 0402   HGB 10.1 (L) 11/14/2023 0402   HGB 11.0 (L) 06/01/2017 1046   HCT 32.0 (L) 11/14/2023 0402   HCT 32.8 (L) 06/01/2017 1046   PLT 174 11/14/2023 0402   PLT 196 06/01/2017 1046   MCV 85.1 11/14/2023 0402   MCV 92 06/01/2017 1046   MCH 26.9 11/14/2023 0402   MCHC 31.6 11/14/2023 0402   RDW 15.8 (H) 11/14/2023 0402   RDW 14.0 06/01/2017 1046   LYMPHSABS 1.1 11/10/2023 0138   LYMPHSABS 2.4 06/01/2017 1046   MONOABS 0.9 11/10/2023 0138   EOSABS 0.0 11/10/2023 0138   EOSABS 0.1 06/01/2017 1046   BASOSABS 0.1 11/10/2023 0138   BASOSABS 0.0 06/01/2017 1046    BMET    Component Value Date/Time   NA 137 11/14/2023 0402   NA 141 02/02/2018 1051   K 4.1 11/14/2023 0402   CL 106 11/14/2023 0402   CO2 21 (L) 11/14/2023 0402   GLUCOSE 127 (H) 11/14/2023 0402   BUN 30 (H) 11/14/2023 0402   BUN 36 (H) 02/02/2018 1051   CREATININE 1.39 (H) 11/14/2023 0402   CALCIUM 7.9 (L) 11/14/2023 0402   GFRNONAA 38 (L) 11/14/2023 0402   GFRAA 45 (L) 04/11/2018 1137    INR No results found for: "INR"   Intake/Output Summary (Last 24 hours) at 11/14/2023 0711 Last data filed at 11/14/2023 8119 Gross per 24 hour  Intake 94.61 ml  Output 900 ml  Net -805.39 ml      Assessment/Plan:  83 y.o. female is s/p:   diagnostic heart cath   3 Days Post-Op   -pt with brisk right PT doppler flow and monophasic left PT doppler flow -Dr. Lenell Antu had discussions on options with her  daughter yesterday and will touch base again with her today.    Doreatha Massed, PA-C [facility] [phone] 11/14/2023 7:11 AM  VASCULAR STAFF ADDENDUM: I have independently interviewed and examined the patient. I agree with the above.   Discussed Mrs. Ruby's case with ID again today.  The family would like to defer any intervention to allow her heart to recover.  I think this is very reasonable.  I will see her again in 3 months in the outpatient setting to see how she is doing.  She should follow-up with cardiology to see if complex PCI is feasible, but I doubt this given recent cath findings and discussion with cardiology staff.  Okay for discharge from my standpoint.  Please call for questions.  Rande Brunt. Lenell Antu, MD [facility] Phone Number: [phone] 11/14/2023 9:38 AM

## 2023-11-15 DIAGNOSIS — I214 Non-ST elevation (NSTEMI) myocardial infarction: Secondary | ICD-10-CM | POA: Diagnosis not present

## 2023-11-15 DIAGNOSIS — J9601 Acute respiratory failure with hypoxia: Secondary | ICD-10-CM | POA: Diagnosis not present

## 2023-11-15 DIAGNOSIS — I4819 Other persistent atrial fibrillation: Secondary | ICD-10-CM | POA: Diagnosis not present

## 2023-11-15 DIAGNOSIS — F0390 Unspecified dementia without behavioral disturbance: Secondary | ICD-10-CM | POA: Diagnosis not present

## 2023-11-15 DIAGNOSIS — I739 Peripheral vascular disease, unspecified: Secondary | ICD-10-CM | POA: Diagnosis not present

## 2023-11-15 DIAGNOSIS — Z7189 Other specified counseling: Secondary | ICD-10-CM | POA: Diagnosis not present

## 2023-11-15 DIAGNOSIS — L97922 Non-pressure chronic ulcer of unspecified part of left lower leg with fat layer exposed: Secondary | ICD-10-CM | POA: Diagnosis not present

## 2023-11-15 LAB — CBC
HCT: 30.6 % — ABNORMAL LOW (ref 36.0–46.0)
Hemoglobin: 9.6 g/dL — ABNORMAL LOW (ref 12.0–15.0)
MCH: 26.6 pg (ref 26.0–34.0)
MCHC: 31.4 g/dL (ref 30.0–36.0)
MCV: 84.8 fL (ref 80.0–100.0)
Platelets: 171 10*3/uL (ref 150–400)
RBC: 3.61 MIL/uL — ABNORMAL LOW (ref 3.87–5.11)
RDW: 15.5 % (ref 11.5–15.5)
WBC: 7.5 10*3/uL (ref 4.0–10.5)
nRBC: 0 % (ref 0.0–0.2)

## 2023-11-15 LAB — BASIC METABOLIC PANEL
Anion gap: 10 (ref 5–15)
BUN: 25 mg/dL — ABNORMAL HIGH (ref 8–23)
CO2: 21 mmol/L — ABNORMAL LOW (ref 22–32)
Calcium: 8.1 mg/dL — ABNORMAL LOW (ref 8.9–10.3)
Chloride: 105 mmol/L (ref 98–111)
Creatinine, Ser: 1.33 mg/dL — ABNORMAL HIGH (ref 0.44–1.00)
GFR, Estimated: 40 mL/min — ABNORMAL LOW (ref >60)
Glucose, Bld: 100 mg/dL — ABNORMAL HIGH (ref 70–99)
Potassium: 3.8 mmol/L (ref 3.5–5.1)
Sodium: 136 mmol/L (ref 135–145)

## 2023-11-15 LAB — GLUCOSE, CAPILLARY
Glucose-Capillary: 111 mg/dL — ABNORMAL HIGH (ref 70–99)
Glucose-Capillary: 143 mg/dL — ABNORMAL HIGH (ref 70–99)
Glucose-Capillary: 129 mg/dL — ABNORMAL HIGH (ref 70–99)
Glucose-Capillary: 114 mg/dL — ABNORMAL HIGH (ref 70–99)

## 2023-11-15 MED ORDER — POLYETHYLENE GLYCOL 3350 17 G PO PACK
17.0000 g | PACK | Freq: Every day | ORAL | Status: DC
  Administered 2023-11-15 – 2023-11-17 (×3): 17 g via ORAL
  Filled 2023-11-15 (×3): qty 1

## 2023-11-15 MED ORDER — SENNOSIDES-DOCUSATE SODIUM 8.6-50 MG PO TABS
2.0000 | ORAL_TABLET | Freq: Two times a day (BID) | ORAL | Status: DC
  Administered 2023-11-15 – 2023-11-17 (×5): 2 via ORAL
  Filled 2023-11-15 (×5): qty 2

## 2023-11-15 MED ORDER — HYDROCODONE-ACETAMINOPHEN 5-325 MG PO TABS
0.5000 | ORAL_TABLET | Freq: Three times a day (TID) | ORAL | Status: DC
  Administered 2023-11-15 – 2023-11-17 (×7): 0.5 via ORAL
  Filled 2023-11-15 (×7): qty 1

## 2023-11-15 NOTE — Progress Notes (Signed)
 [facility] PROGRESS NOTE   Tina Hester GNF:[id number] DOB: 09-08-1940 DOA: 11/10/2023  PCP: [name], MD  Brief History: 83 y.o. female with medical history significant of hypertension, hyperlipidemia, persistent atrial fibrillation, peripheral vascular disease, diabetes mellitus type 2, hypothyroidism, dementia, and GERD who presents with complaints of chest pain and shortness of breath.  Patient recently developed an ulcer in the left leg and was seen by vascular surgery recently with plans for an angiogram on 3/14.  Patient was found to have elevated troponin levels.  Patient was seen by cardiology.  Patient was hospitalized for further management.   Consultants: Cardiology.  Vascular surgery.  Procedures: Lower extremity angiogram 3/14.  Cardiac catheterization 3/13.    Subjective/Interval History: Patient continues to have pain in the left leg.  No chest pain or shortness of breath.    Assessment/Plan:  NSTEMI Seen by cardiology.  Underwent cardiac catheterization which showed significant coronary artery disease but not thought to be a candidate for any intervention.  Medical management is recommended. Patient is on aspirin beta-blockers Zetia and statin.  LDL is noted to be 56.  Acute respiratory failure with hypoxia She was noted to have saturations of 89% on room air.  Placed on oxygen.  Echocardiogram shows LVEF of 60 to 65%.   Patient was started on twice daily furosemide by cardiology.   Furosemide held due to elevated creatinine.   Respiratory status is stable.  Saturating normal on room air now.  Acute on chronic kidney disease stage IIIb-abnormal UA/concern for UTI/hypokalemia Baseline creatinine is around 1.4.  Creatinine worsened over the last 2 to 3 days.  Diuretics were placed on hold.   She did receive contrast for both cardiac catheterization as well as lower extremity angiogram.   Oral intake seemed to be poor.  She was gently hydrated.    Creatinine gradually improved and now back to baseline.   UA was done on 3/13 which showed large leukocytes with few bacteria and greater than 50 WBC.  She was started on ceftriaxone.  Will give a 5-day course. Potassium level has improved.  Continue to monitor daily for now. Monitor urine output.  Renal function stable this morning.  Left leg ulcer/peripheral artery disease Developed ulcer in the left lateral calf area about 2 to 3 weeks ago.  Was seen by vascular surgery recently.  Recent ABIs noted with concern raised for left lower extremity ischemia.   Seen by vascular surgery.  Underwent lower extremity angiogram on 3/14.  CFA occlusion was noted.  Ideally needs endarterectomy would be a high risk procedure due to her other comorbidities. Vascular surgery had multiple discussions with patient and family.  At this time they would like to hold off on any procedures.  Palliative care to be consulted for goals of care conversation and consideration of hospice.   Pain is the main issue currently.  She required multiple doses of intravenous pain medications yesterday.  Will place her on low-dose hydrocodone on a scheduled basis 3 times a day.  Discussed with patient's son who is agreeing with this plan.  Bowel regimen will be ordered as well.  Permanent atrial fibrillation Patient is on metoprolol for rate control.   Had tachyarrhythmia on 3/16 which has improved.  Continue to monitor on telemetry. Transitioned back to apixaban.    Normocytic anemia Drop in hemoglobin is dilutional.  No evidence of overt bleeding.  Continue to monitor.  Diabetes mellitus type 2, uncontrolled with hyperglycemia Last HbA1c is 8 from  September.  Noted to be on Jardiance and NPH at home.  This is being continued.  Monitor CBGs.  SSI.  Essential hypertension Was noted to have low blood pressures.  Was on metoprolol 4 times a day which was decreased to twice daily.  Blood pressure seems to have improved.  Continue  to monitor.  Hyperlipidemia Continue statin and Zetia.  Was not on statin prior to admission.  History of dementia Continue donepezil.  Hypothyroidism Continue levothyroxine.  Obesity Estimated body mass index is 34.88 kg/m as calculated from the following:   Height as of this encounter: 5' (1.524 m).   Weight as of this encounter: 81 kg.  DVT Prophylaxis: apixaban Code Status: Full code Family Communication:  No family at bedside.  Son was updated. Disposition Plan: To be determined.  Plan is for family to take her home when ready.  PT and OT consulted.     Medications: Scheduled:  apixaban  5 mg Oral BID   aspirin EC  81 mg Oral Daily   donepezil  5 mg Oral QHS   ezetimibe  10 mg Oral QPM   gabapentin  300 mg Oral QHS   HYDROcodone-acetaminophen  0.5 tablet Oral TID   insulin aspart  0-9 Units Subcutaneous TID WC   insulin NPH Human  10 Units Subcutaneous BID AC & HS   levothyroxine  150 mcg Oral Q0600   metoprolol tartrate  25 mg Oral BID   polyethylene glycol  17 g Oral Daily   rosuvastatin  20 mg Oral Daily   senna-docusate  2 tablet Oral BID   sertraline  50 mg Oral Daily   sodium chloride flush  3 mL Intravenous Q12H   Continuous:  cefTRIAXone (ROCEPHIN)  IV Stopped (11/14/23 1114)   WUJ:WJXBJYNWGNFAO, ALPRAZolam, HYDROcodone-acetaminophen, HYDROmorphone (DILAUDID) injection, ondansetron (ZOFRAN) IV, sodium chloride flush   Objective:  Vital Signs  Vitals:   11/14/23 2310 11/15/23 0355 11/15/23 0727 11/15/23 0805  BP: (!) 139/58 126/62  123/67  Pulse: 89 97  86  Resp: 19 18  14   Temp: (!) 97.5 F (36.4 C) 97.6 F (36.4 C)  98 F (36.7 C)  TempSrc: Oral Oral  Oral  SpO2: 96% 96%  97%  Weight:   81 kg   Height:        Intake/Output Summary (Last 24 hours) at 11/15/2023 1016 Last data filed at 11/15/2023 0248 Gross per 24 hour  Intake 220 ml  Output 1200 ml  Net -980 ml   Filed Weights   11/13/23 0315 11/14/23 0500 11/15/23 0727  Weight:  80.9 kg 82 kg 81 kg    General appearance: Awake alert.  In no distress Resp: Clear to auscultation bilaterally.  Normal effort Cardio: S1-S2 is normal regular.  No S3-S4.  No rubs murmurs or bruit GI: Abdomen is soft.  Nontender nondistended.  Bowel sounds are present normal.  No masses organomegaly Extremities: Dressing noted over the left calf area.   Lab Results:  Data Reviewed: I have personally reviewed following labs and reports of the imaging studies  CBC: Recent Labs  Lab 11/10/23 0138 11/11/23 0302 11/12/23 0351 11/13/23 0824 11/14/23 0402 11/15/23 0408  WBC 10.5 8.4 7.2 6.9 6.9 7.5  NEUTROABS 8.4*  --   --   --   --   --   HGB 12.0 11.0* 9.9* 9.9* 10.1* 9.6*  HCT 38.7 35.3* 31.7* 31.0* 32.0* 30.6*  MCV 88.6 86.5 86.8 85.2 85.1 84.8  PLT 181 168 152 157 174  171    Basic Metabolic Panel: Recent Labs  Lab 11/11/23 0302 11/12/23 0351 11/13/23 0824 11/14/23 0402 11/15/23 0408  NA 136 137 136 137 136  K 3.9 3.4* 3.7 4.1 3.8  CL 103 103 105 106 105  CO2 23 23 20* 21* 21*  GLUCOSE 140* 159* 95 127* 100*  BUN 26* 33* 36* 30* 25*  CREATININE 1.58* 1.79* 1.73* 1.39* 1.33*  CALCIUM 8.3* 7.7* 7.8* 7.9* 8.1*    GFR: Estimated Creatinine Clearance: 30.7 mL/min (A) (by C-G formula based on SCr of 1.33 mg/dL (H)).   CBG: Recent Labs  Lab 11/14/23 0612 11/14/23 1205 11/14/23 1623 11/14/23 2106 11/15/23 0619  GLUCAP 109* 134* 87 117* 111*     Recent Results (from the past 240 hours)  Resp panel by RT-PCR (RSV, Flu A&B, Covid) Anterior Nasal Swab     Status: None   Collection Time: 11/10/23  1:42 AM   Specimen: Anterior Nasal Swab  Result Value Ref Range Status   SARS Coronavirus 2 by RT PCR NEGATIVE NEGATIVE Final   Influenza A by PCR NEGATIVE NEGATIVE Final   Influenza B by PCR NEGATIVE NEGATIVE Final    Comment: (NOTE) The Xpert Xpress SARS-CoV-2/FLU/RSV plus assay is intended as an aid in the diagnosis of influenza from Nasopharyngeal swab  specimens and should not be used as a sole basis for treatment. Nasal washings and aspirates are unacceptable for Xpert Xpress SARS-CoV-2/FLU/RSV testing.  Fact Sheet for Patients: BloggerCourse.com  Fact Sheet for Healthcare Providers: SeriousBroker.it  This test is not yet approved or cleared by the [place] FDA and has been authorized for detection and/or diagnosis of SARS-CoV-2 by FDA under an Emergency Use Authorization (EUA). This EUA will remain in effect (meaning this test can be used) for the duration of the COVID-19 declaration under Section 564(b)(1) of the Act, 21 U.S.C. section 360bbb-3(b)(1), unless the authorization is terminated or revoked.     Resp Syncytial Virus by PCR NEGATIVE NEGATIVE Final    Comment: (NOTE) Fact Sheet for Patients: BloggerCourse.com  Fact Sheet for Healthcare Providers: SeriousBroker.it  This test is not yet approved or cleared by the [place] FDA and has been authorized for detection and/or diagnosis of SARS-CoV-2 by FDA under an Emergency Use Authorization (EUA). This EUA will remain in effect (meaning this test can be used) for the duration of the COVID-19 declaration under Section 564(b)(1) of the Act, 21 U.S.C. section 360bbb-3(b)(1), unless the authorization is terminated or revoked.  Performed at [facility], [address], [place], [place] [address]   Urine Culture     Status: Abnormal   Collection Time: 11/10/23  5:22 AM   Specimen: Urine, Random  Result Value Ref Range Status   Specimen Description URINE, RANDOM  Final   Special Requests   Final    NONE Reflexed from [id number] Performed at [facility], [address] [place], [place] [address]    Culture MULTIPLE SPECIES PRESENT, SUGGEST RECOLLECTION (A)  Final   Report Status 11/11/2023 FINAL  Final      Radiology Studies: DG CHEST PORT 1  VIEW Result Date: 11/13/2023 CLINICAL DATA:  Shortness of breath EXAM: PORTABLE CHEST 1 VIEW COMPARISON:  11/10/2023 FINDINGS: Heart and mediastinal contours within normal limits. No confluent airspace opacity or effusion. No acute bony abnormality. IMPRESSION: No active disease. Electronically Signed   By: Charlett Nose M.D.   On: 11/13/2023 23:38       LOS: 5 days  Garald Rhew [employer]  [facility] [employer] on [email or web address]  11/15/2023, 10:16 AM

## 2023-11-15 NOTE — Care Management Important Message (Signed)
 Important Message  Patient Details  Name: Tina Hester MRN: [id number] Date of Birth: [birth year]   Important Message Given:  Yes - Medicare IM     Renie Ora 11/15/2023, 9:36 AM

## 2023-11-15 NOTE — Evaluation (Signed)
 Physical Therapy Evaluation Patient Details Name: Tina Hester MRN: [id number] DOB: 1941/02/15 Today's Date: 11/15/2023  History of Present Illness  Pt is 83 yo female who presents on 11/08/23 with NSTEMI and LLE ulcer. PMH: DM II, GERD, LBP, Glaucoma, CHF, aortic stenosis, aortic valve disorder, gout, OSA, HTN, HLD, Afib, CKD III, hypothyroidism, TIA, lupus, CAD, PAD, anxiety, dementia.  Clinical Impression  Pt admitted with above diagnosis. Pt from home with daughter and has caregiver 2 days/wk. On eval pt mobilizing at min A level to come to EOB, stand to RW and take steps to [facility] and recliner. Needed mod A at end of session when fatigued. Noted to have wounds B heels and L lateral malleolus. Pt also c/o bottom being sore. Recommend HHPT upon d.c and we will follow acutely. Pt currently with functional limitations due to the deficits listed below (see PT Problem List). Pt will benefit from acute skilled PT to increase their independence and safety with mobility to allow discharge.           If plan is discharge home, recommend the following: A little help with walking and/or transfers;A little help with bathing/dressing/bathroom;Assistance with cooking/housework;Assist for transportation;Help with stairs or ramp for entrance   Can travel by private vehicle        Equipment Recommendations None recommended by PT  Recommendations for Other Services  OT consult    Functional Status Assessment Patient has had a recent decline in their functional status and demonstrates the ability to make significant improvements in function in a reasonable and predictable amount of time.     Precautions / Restrictions Precautions Precautions: Fall Recall of Precautions/Restrictions: Impaired Precaution/Restrictions Comments: blind R eye Restrictions Weight Bearing Restrictions Per Provider Order: No      Mobility  Bed Mobility Overal bed mobility: Needs Assistance Bed Mobility: Supine to  Sit     Supine to sit: Min assist     General bed mobility comments: min A for elevation of trunk, pt able to bring LE"s off EOB    Transfers Overall transfer level: Needs assistance Equipment used: Rolling walker (2 wheels) Transfers: Sit to/from Stand, Bed to chair/wheelchair/BSC Sit to Stand: Min assist   Step pivot transfers: Min assist, Mod assist       General transfer comment: min A for power up to RW. Stepped to [facility] and then later [facility] to recliner with RW and min A. Posterior bias noted, esp with initial standing. Pt helped up from recliner later and needed mod A for power up when fatigued    Ambulation/Gait Ambulation/Gait assistance: Min assist Gait Distance (Feet): 4 Feet Assistive device: Rolling walker (2 wheels) Gait Pattern/deviations: Step-through pattern, Decreased stride length, Trunk flexed Gait velocity: decreased Gait velocity interpretation: <1.31 ft/sec, indicative of household ambulator   General Gait Details: fatigues quickly, vc's for posture  Stairs            Wheelchair Mobility     Tilt Bed    Modified Rankin (Stroke Patients Only)       Balance Overall balance assessment: Needs assistance Sitting-balance support: Feet supported, Single extremity supported Sitting balance-Leahy Scale: Fair Sitting balance - Comments: close guarding for safety Postural control: Posterior lean Standing balance support: Bilateral upper extremity supported, During functional activity Standing balance-Leahy Scale: Poor Standing balance comment: needs BUE support as well as min A to prevent posterior LOB  Pertinent Vitals/Pain Pain Assessment Pain Assessment: Faces Faces Pain Scale: Hurts even more Pain Location: buttocks, LLE Pain Descriptors / Indicators: Aching, Constant, Grimacing, Sore Pain Intervention(s): Limited activity within patient's tolerance, Monitored during session    Home Living  Family/patient expects to be discharged to:: Private residence Living Arrangements: Children Available Help at Discharge: Available 24 hours/day Type of Home: House Home Access: Stairs to enter Entrance Stairs-Rails: Right;Left;Can reach both Entrance Stairs-Number of Steps: 4   Home Layout: Two level;Able to live on main level with bedroom/bathroom Home Equipment: Rolling Walker (2 wheels);Shower seat;Wheelchair Financial trader (4 wheels);BSC/3in1 Additional Comments: pt lives with daughter, has caregiver 2 days/wk per pt report    Prior Function Prior Level of Function : Needs assist             Mobility Comments: ambulates with RW has sometimes needed physical assist to get up from bed or chair ADLs Comments: assisted     Extremity/Trunk Assessment   Upper Extremity Assessment Upper Extremity Assessment: Defer to OT evaluation;Generalized weakness    Lower Extremity Assessment Lower Extremity Assessment: Generalized weakness    Cervical / Trunk Assessment Cervical / Trunk Assessment: Kyphotic  Communication   Communication Communication: No apparent difficulties    Cognition Arousal: Alert Behavior During Therapy: WFL for tasks assessed/performed   PT - Cognitive impairments: History of cognitive impairments                       PT - Cognition Comments: STM deficits but pt able to follow commands and remember tasks within session Following commands: Intact       Cueing Cueing Techniques: Verbal cues, Tactile cues     General Comments General comments (skin integrity, edema, etc.): son present on eval. VSS. Pt with noted wounds on B heels and L lateral malleolus. Spoke with RN about mepilex to heels    Exercises     Assessment/Plan    PT Assessment Patient needs continued PT services  PT Problem List Decreased strength;Decreased activity tolerance;Decreased balance;Decreased mobility;Decreased cognition;Decreased coordination;Decreased  knowledge of use of DME;Decreased safety awareness;Cardiopulmonary status limiting activity;Pain;Decreased knowledge of precautions       PT Treatment Interventions DME instruction;Gait training;Stair training;Functional mobility training;Therapeutic activities;Therapeutic exercise;Balance training;Neuromuscular re-education;Cognitive remediation;Patient/family education    PT Goals (Current goals can be found in the Care Plan section)  Acute Rehab PT Goals Patient Stated Goal: return home PT Goal Formulation: With patient/family Time For Goal Achievement: 11/29/23 Potential to Achieve Goals: Good    Frequency Min 2X/week     Co-evaluation               AM-PAC PT "6 Clicks" Mobility  Outcome Measure Help needed turning from your back to your side while in a flat bed without using bedrails?: A Little Help needed moving from lying on your back to sitting on the side of a flat bed without using bedrails?: A Little Help needed moving to and from a bed to a chair (including a wheelchair)?: A Little Help needed standing up from a chair using your arms (e.g., wheelchair or bedside chair)?: A Lot Help needed to walk in hospital room?: A Lot Help needed climbing 3-5 steps with a railing? : Total 6 Click Score: 14    End of Session Equipment Utilized During Treatment: Gait belt Activity Tolerance: Patient tolerated treatment well Patient left: in chair;with call bell/phone within reach;with chair alarm set;with family/visitor present Nurse Communication: Mobility status PT Visit Diagnosis: Unsteadiness on feet (  R26.81);Muscle weakness (generalized) (M62.81);Difficulty in walking, not elsewhere classified (R26.2);Pain Pain - Right/Left: Left Pain - part of body: Leg    Time: 1030-1110 PT Time Calculation (min) (ACUTE ONLY): 40 min   Charges:   PT Evaluation $PT Eval Moderate Complexity: 1 Mod PT Treatments $Therapeutic Activity: 23-37 mins PT General Charges $$ ACUTE PT  VISIT: 1 Visit         Lyanne Co, PT  Acute Rehab Services Secure chat preferred Office [phone]   Lawana Chambers Cliffard Hair 11/15/2023, 12:08 PM

## 2023-11-15 NOTE — Consult Note (Addendum)
 Consultation Note Date: 11/15/2023   Patient Name: Tina Hester  DOB: 16-Feb-1941  MRN: [id number]  Age / Sex: 83 y.o., female  PCP: [name], MD Referring Physician: Osvaldo Shipper, MD  Reason for Consultation: Patient's family wants to discuss hospice care  HPI/Patient Profile: 83 y.o. female  with past medical history of hypertension hyperlipidemia, A-fib, peripheral vascular disease, DM2 admitted on 11/10/2023 with NSTEMI.  She also had lower extremity pain and underwent angiogram which revealed a common femoral artery occlusion-she is not a candidate for surgery at this time  Reviewed cardiac cath that showed significant coronary artery disease and she is not a candidate for intervention.  Reviewed echo that showed ejection fraction at 60 to 65%.  She is not short of breath and is satting normal on room air.  There was evidence of acute kidney injury however this has resolved.  Palliative medicine consulted for goals of care.  Primary Decision Maker PATIENT -her surrogate decision makers are her son and daughter  Discussion: I have reviewed medical records including Care Everywhere, progress notes from this and prior admissions, labs and imaging, discussed with RN.  On evaluation patient is awake alert she is oriented to person place time and situation.  She is able to participate in goals of care discussion.  I met at the bedside with her and her daughter, Tina Hester.  I introduced Palliative Medicine as specialized medical care for people living with serious illness. It focuses on providing relief from the symptoms and stress of a serious illness. The goal is to improve quality of life for both the patient and the family.   As far as functional and nutritional status -she has had a little decline in her functional status this hospitalization-she is able to transfer from bed to chair chair to toilet.   She requires assistance with bathing.  They do have hired caregivers that come into the home and assist.  She has had a decreased appetite, both she tributes this to not liking the hospital food.  We discussed patient's current illness and what it means in the larger context of patient's on-going co-morbidities.  Natural disease trajectory and expectations at EOL were discussed.  She understands that without treatment the occlusion of her CFA could be life-threatening.  Her pain is well-controlled at this time with hydrocodone 3 times a day she does not feel there needs to be changes made.  Advance care planning  I attempted to elicit values and goals of care important to the patient.  Her primary goal is to be at home with her family.  However,  if her health were to decline she is interested in continued life-prolonging treatments and hospitalizations.  She and her daughter have not ruled out possible surgery for her femoral artery if her functional status improves and surgery is in agreement.  Advance directives, concepts specific to code status, artificial feeding and hydration, and rehospitalization were considered and discussed.  Patient does not have advanced directives completed.  She states she would not want  to be living in a vegetative state kept alive by artificial life support.  Encouraged patient/family to consider DNR/DNI status understanding evidenced based poor outcomes in similar hospitalized patients, as the cause of the arrest is likely associated with chronic/terminal disease rather than a reversible acute cardio-pulmonary event.   She was unable to make a decision regarding CODE STATUS -wishes to remain full code and will continue to discuss with her daughter.  Discussed with patient/family the importance of continued conversation with family and the medical providers regarding overall plan of care and treatment options, ensuring decisions are within the context of the  patient's values and GOCs.    Hospice and Palliative Care services outpatient were explained and offered.  Family requests palliative services at home.  They understand that they can easily transition to hospice if needed and desired.  Patient is hopeful to go home tomorrow.  Questions and concerns were addressed. The family was encouraged to call with questions or concerns.      SUMMARY OF RECOMMENDATIONS -Patient with recent NSTEMI and new finding of occlusion of common femoral artery-she is not a candidate for surgical interventions at this time but is hopeful for interventions in the future as she recovers from her NSTEMI -Patient and family agreed to outpatient palliative follow-up at home -Continue full code for now but patient and family will continue to discuss recommend ongoing conversations regarding CODE STATUS with attending team and outpatient palliative  Code Status/Advance Care Planning:   Code Status: Full Code    Prognosis:   Unable to determine  Discharge Planning: Home with Palliative Services  Primary Diagnoses: Present on Admission:  Heart failure with preserved ejection fraction (HCC)  NSTEMI (non-ST elevated myocardial infarction) (HCC)  Essential hypertension  Persistent atrial fibrillation (HCC)  Chronic kidney disease, stage 3b (HCC)  PVD (peripheral vascular disease) (HCC)  Acute respiratory failure with hypoxia (HCC)  Mixed hyperlipidemia  Dementia without behavioral disturbance (HCC)  Acquired hypothyroidism  Obesity (BMI 30-39.9)  Chronic ulcer of left leg (HCC)   Review of Systems  Constitutional:  Positive for activity change and appetite change.  Respiratory:  Negative for shortness of breath.     Physical Exam Vitals and nursing note reviewed.  Constitutional:      General: She is not in acute distress.    Appearance: She is not ill-appearing.  Cardiovascular:     Rate and Rhythm: Normal rate.  Pulmonary:     Effort: Pulmonary  effort is normal.  Neurological:     Mental Status: She is alert and oriented to person, place, and time.     Vital Signs: BP (!) 121/51 (BP Location: Left Arm)   Pulse 72   Temp 99.6 F (37.6 C) (Oral)   Resp 14   Ht 5' (1.524 m)   Wt 81 kg   SpO2 97%   BMI 34.88 kg/m  Pain Scale: 0-10 POSS *See Group Information*: S-Acceptable,Sleep, easy to arouse Pain Score: 0-No pain   SpO2: SpO2: 97 % O2 Device:SpO2: 97 % O2 Flow Rate: .O2 Flow Rate (L/min): 4 L/min  IO: Intake/output summary:  Intake/Output Summary (Last 24 hours) at 11/15/2023 1543 Last data filed at 11/15/2023 0800 Gross per 24 hour  Intake 340 ml  Output 700 ml  Net -360 ml    LBM: Last BM Date : 11/12/23 Baseline Weight: Weight: 85.7 kg Most recent weight: Weight: 81 kg       Thank you for this consult. Palliative medicine will continue to follow and  assist as needed.   Signed by: Ocie Bob, AGNP-C Palliative Medicine  Time includes:   Preparing to see the patient (e.g., review of tests) Obtaining and/or reviewing separately obtained history Performing a medically necessary appropriate examination and/or evaluation Counseling and educating the patient/family/caregiver Ordering medications, tests, or procedures Referring and communicating with other health care professionals (when not reported separately) Documenting clinical information in the electronic or other health record Independently interpreting results (not reported separately) and communicating results to the patient/family/caregiver Care coordination (not reported separately) Clinical documentation   Please contact Palliative Medicine Team phone at [phone] for questions and concerns.  For individual provider: See Loretha Stapler

## 2023-11-16 DIAGNOSIS — I214 Non-ST elevation (NSTEMI) myocardial infarction: Secondary | ICD-10-CM | POA: Diagnosis not present

## 2023-11-16 LAB — BASIC METABOLIC PANEL
Anion gap: 6 (ref 5–15)
BUN: 22 mg/dL (ref 8–23)
CO2: 23 mmol/L (ref 22–32)
Calcium: 8.1 mg/dL — ABNORMAL LOW (ref 8.9–10.3)
Chloride: 107 mmol/L (ref 98–111)
Creatinine, Ser: 1.28 mg/dL — ABNORMAL HIGH (ref 0.44–1.00)
GFR, Estimated: 42 mL/min — ABNORMAL LOW (ref >60)
Glucose, Bld: 131 mg/dL — ABNORMAL HIGH (ref 70–99)
Potassium: 3.7 mmol/L (ref 3.5–5.1)
Sodium: 136 mmol/L (ref 135–145)

## 2023-11-16 LAB — CBC
HCT: 30.7 % — ABNORMAL LOW (ref 36.0–46.0)
Hemoglobin: 9.8 g/dL — ABNORMAL LOW (ref 12.0–15.0)
MCH: 26.9 pg (ref 26.0–34.0)
MCHC: 31.9 g/dL (ref 30.0–36.0)
MCV: 84.3 fL (ref 80.0–100.0)
Platelets: 171 10*3/uL (ref 150–400)
RBC: 3.64 MIL/uL — ABNORMAL LOW (ref 3.87–5.11)
RDW: 15.5 % (ref 11.5–15.5)
WBC: 7 10*3/uL (ref 4.0–10.5)
nRBC: 0 % (ref 0.0–0.2)

## 2023-11-16 LAB — GLUCOSE, CAPILLARY
Glucose-Capillary: 115 mg/dL — ABNORMAL HIGH (ref 70–99)
Glucose-Capillary: 124 mg/dL — ABNORMAL HIGH (ref 70–99)
Glucose-Capillary: 214 mg/dL — ABNORMAL HIGH (ref 70–99)

## 2023-11-16 NOTE — Progress Notes (Signed)
 PROGRESS NOTE    PA TENNANT  VWU:[id number] DOB: April 24, 1941 DOA: 11/10/2023 PCP: [name], MD   Brief Narrative:  83 y.o. female with medical history significant of hypertension, hyperlipidemia, persistent atrial fibrillation, peripheral vascular disease, diabetes mellitus type 2, hypothyroidism, dementia, and GERD presented with chest pain and worsening shortness of breath.  Patient recently developed an ulcer in the left leg and was seen by vascular surgery recently with plans for an angiogram on 3/14. Patient was found to have elevated troponin levels and was admitted for non-STEMI.  Cardiology and vascular surgery was consulted.  She underwent cardiac catheterization on 11/10/2023 which showed significant coronary artery disease and cardiology recommended medical management.  She underwent lower extremity angiogram on 11/11/2023.  Assessment & Plan:   NSTEMI -Underwent cardiac catheterization which showed significant coronary artery disease but not thought to be a candidate for any intervention.  Echo as below.  Medical management recommended by cardiology.  Cardiology signed off on 11/12/2023.  Outpatient follow-up with cardiology -Currently chest pain-free -Continue aspirin, beta-blocker, Zetia and statin.  LDL was 56.  Acute respiratory failure with hypoxia -Initially required supplemental oxygen. -Echocardiogram showed LVEF of 60 to 65%.   Treated with IV Lasix by cardiology but subsequently Lasix was held due to elevated creatinine.   -Respiratory status is stable.  Saturating normal on room air now.   Acute on chronic kidney disease stage IIIb-abnormal UA/concern for UTI -Baseline creatinine is around 1.4.   -Treated with IV fluids and diuretics were held. -Creatinine now at baseline, 1.28 today. Creatinine gradually improved and now back to baseline.   -UA suggestive of possible UTI.  Currently on Rocephin: Complete 5-day course.  Urine cultures grew multiple  species  Hypokalemia -Resolved   left leg ulcer/peripheral artery disease -Recent ABIs noted with concern raised for left lower extremity ischemia.   -Underwent lower extremity angiogram on 3/14.  CFA occlusion was noted.  Ideally needs endarterectomy would be a high risk procedure due to her other comorbidities. -Vascular surgery had multiple discussions with patient and family.  At this time they would like to hold off on any procedures.   -Continue current pain management.   Permanent atrial fibrillation -Continue metoprolol and Eliquis.     Normocytic anemia/anemia of chronic disease -From chronic illnesses.  Hemoglobin mostly stable.  No evidence of overt bleeding.  Continue to monitor intermittently.   Diabetes mellitus type 2, uncontrolled with hyperglycemia -Last HbA1c is 8 from September 204.  Noted to be on Jardiance and NPH at home.  This is being continued.  Continue CBGs with SSI.  Essential hypertension Was noted to have low blood pressures.  Was on metoprolol 4 times a day which was decreased to twice daily.  Blood pressure seems to have improved.  Continue to monitor.   Hyperlipidemia Continue statin and Zetia.  Was not on statin prior to admission.   History of dementia Continue donepezil.   Hypothyroidism Continue levothyroxine.   Obesity class I -Outpatient follow-up  Goals of care -Overall prognosis is guarded to poor.  Palliative care following.  Remains full code.  DVT prophylaxis: Eliquis Code Status: Full Family Communication: None at bedside Disposition Plan: Status is: Inpatient Remains inpatient appropriate because: Of severity of illness.  Possible discharge home tomorrow if remains stable  Consultants: Cardiology/vascular surgery/palliative care  Procedures: As above  Antimicrobials:  Anti-infectives (From admission, onward)    Start     Dose/Rate Route Frequency Ordered Stop   11/12/23 1130  cefTRIAXone (ROCEPHIN)  1 g in sodium  chloride 0.9 % 100 mL IVPB        1 g 200 mL/hr over 30 Minutes Intravenous Every 24 hours 11/12/23 1043 11/17/23 1129        Subjective: Patient seen and examined at bedside.  Still complains of intermittent left leg pain.  No fever, vomiting, agitation reported.  Objective: Vitals:   11/15/23 1934 11/15/23 2334 11/16/23 0330 11/16/23 0334  BP: (!) 147/67 (!) 136/55 (!) 122/42   Pulse:      Resp: 15 16 14    Temp: 98.7 F (37.1 C) 97.8 F (36.6 C) 97.8 F (36.6 C)   TempSrc: Oral Oral Oral   SpO2:      Weight:    84.2 kg  Height:        Intake/Output Summary (Last 24 hours) at 11/16/2023 0756 Last data filed at 11/16/2023 0335 Gross per 24 hour  Intake 440 ml  Output 300 ml  Net 140 ml   Filed Weights   11/14/23 0500 11/15/23 0727 11/16/23 0334  Weight: 82 kg 81 kg 84.2 kg    Examination:  General exam: Appears calm and comfortable.  Looks chronically ill and deconditioned. Respiratory system: Bilateral decreased breath sounds at bases Cardiovascular system: S1 & S2 heard, Rate controlled Gastrointestinal system: Abdomen is obese, nondistended, soft and nontender. Normal bowel sounds heard. Extremities: No cyanosis, clubbing; trace lower extremity edema present. Central nervous system: Awake, slow to respond, poor historian.  No focal neurological deficits. Moving extremities Skin: Dressing over the left calf area.   Psychiatry: Flat affect.  Not agitated.   Data Reviewed: I have personally reviewed following labs and imaging studies  CBC: Recent Labs  Lab 11/10/23 0138 11/11/23 0302 11/12/23 0351 11/13/23 0824 11/14/23 0402 11/15/23 0408 11/16/23 0308  WBC 10.5   < > 7.2 6.9 6.9 7.5 7.0  NEUTROABS 8.4*  --   --   --   --   --   --   HGB 12.0   < > 9.9* 9.9* 10.1* 9.6* 9.8*  HCT 38.7   < > 31.7* 31.0* 32.0* 30.6* 30.7*  MCV 88.6   < > 86.8 85.2 85.1 84.8 84.3  PLT 181   < > 152 157 174 171 171   < > = values in this interval not displayed.   Basic  Metabolic Panel: Recent Labs  Lab 11/12/23 0351 11/13/23 0824 11/14/23 0402 11/15/23 0408 11/16/23 0308  NA 137 136 137 136 136  K 3.4* 3.7 4.1 3.8 3.7  CL 103 105 106 105 107  CO2 23 20* 21* 21* 23  GLUCOSE 159* 95 127* 100* 131*  BUN 33* 36* 30* 25* 22  CREATININE 1.79* 1.73* 1.39* 1.33* 1.28*  CALCIUM 7.7* 7.8* 7.9* 8.1* 8.1*   GFR: Estimated Creatinine Clearance: 32.6 mL/min (A) (by C-G formula based on SCr of 1.28 mg/dL (H)). Liver Function Tests: No results for input(s): "AST", "ALT", "ALKPHOS", "BILITOT", "PROT", "ALBUMIN" in the last 168 hours. No results for input(s): "LIPASE", "AMYLASE" in the last 168 hours. No results for input(s): "AMMONIA" in the last 168 hours. Coagulation Profile: No results for input(s): "INR", "PROTIME" in the last 168 hours. Cardiac Enzymes: No results for input(s): "CKTOTAL", "CKMB", "CKMBINDEX", "TROPONINI" in the last 168 hours. BNP (last 3 results) No results for input(s): "PROBNP" in the last 8760 hours. HbA1C: No results for input(s): "HGBA1C" in the last 72 hours. CBG: Recent Labs  Lab 11/15/23 801 123 8668 11/15/23 1120 11/15/23 1621 11/15/23 2057 11/16/23  0602  GLUCAP 111* 143* 129* 114* 115*   Lipid Profile: No results for input(s): "CHOL", "HDL", "LDLCALC", "TRIG", "CHOLHDL", "LDLDIRECT" in the last 72 hours. Thyroid Function Tests: No results for input(s): "TSH", "T4TOTAL", "FREET4", "T3FREE", "THYROIDAB" in the last 72 hours. Anemia Panel: No results for input(s): "VITAMINB12", "FOLATE", "FERRITIN", "TIBC", "IRON", "RETICCTPCT" in the last 72 hours. Sepsis Labs: Recent Labs  Lab 11/10/23 0148  LATICACIDVEN 1.9    Recent Results (from the past 240 hours)  Resp panel by RT-PCR (RSV, Flu A&B, Covid) Anterior Nasal Swab     Status: None   Collection Time: 11/10/23  1:42 AM   Specimen: Anterior Nasal Swab  Result Value Ref Range Status   SARS Coronavirus 2 by RT PCR NEGATIVE NEGATIVE Final   Influenza A by PCR NEGATIVE  NEGATIVE Final   Influenza B by PCR NEGATIVE NEGATIVE Final    Comment: (NOTE) The Xpert Xpress SARS-CoV-2/FLU/RSV plus assay is intended as an aid in the diagnosis of influenza from Nasopharyngeal swab specimens and should not be used as a sole basis for treatment. Nasal washings and aspirates are unacceptable for Xpert Xpress SARS-CoV-2/FLU/RSV testing.  Fact Sheet for Patients: BloggerCourse.com  Fact Sheet for Healthcare Providers: SeriousBroker.it  This test is not yet approved or cleared by the [place] FDA and has been authorized for detection and/or diagnosis of SARS-CoV-2 by FDA under an Emergency Use Authorization (EUA). This EUA will remain in effect (meaning this test can be used) for the duration of the COVID-19 declaration under Section 564(b)(1) of the Act, 21 U.S.C. section 360bbb-3(b)(1), unless the authorization is terminated or revoked.     Resp Syncytial Virus by PCR NEGATIVE NEGATIVE Final    Comment: (NOTE) Fact Sheet for Patients: BloggerCourse.com  Fact Sheet for Healthcare Providers: SeriousBroker.it  This test is not yet approved or cleared by the [place] FDA and has been authorized for detection and/or diagnosis of SARS-CoV-2 by FDA under an Emergency Use Authorization (EUA). This EUA will remain in effect (meaning this test can be used) for the duration of the COVID-19 declaration under Section 564(b)(1) of the Act, 21 U.S.C. section 360bbb-3(b)(1), unless the authorization is terminated or revoked.  Performed at [facility], [address] [place], [place] [address]   Urine Culture     Status: Abnormal   Collection Time: 11/10/23  5:22 AM   Specimen: Urine, Random  Result Value Ref Range Status   Specimen Description URINE, RANDOM  Final   Special Requests   Final    NONE Reflexed from [id number] Performed at [facility], [address] [place], [place] [address]    Culture MULTIPLE SPECIES PRESENT, SUGGEST RECOLLECTION (A)  Final   Report Status 11/11/2023 FINAL  Final         Radiology Studies: No results found.      Scheduled Meds:  apixaban  5 mg Oral BID   aspirin EC  81 mg Oral Daily   donepezil  5 mg Oral QHS   ezetimibe  10 mg Oral QPM   gabapentin  300 mg Oral QHS   HYDROcodone-acetaminophen  0.5 tablet Oral TID   insulin aspart  0-9 Units Subcutaneous TID WC   insulin NPH Human  10 Units Subcutaneous BID AC & HS   levothyroxine  150 mcg Oral Q0600   metoprolol tartrate  25 mg Oral BID   polyethylene glycol  17 g Oral Daily   rosuvastatin  20 mg Oral Daily  senna-docusate  2 tablet Oral BID   sertraline  50 mg Oral Daily   sodium chloride flush  3 mL Intravenous Q12H   Continuous Infusions:  cefTRIAXone (ROCEPHIN)  IV Stopped (11/15/23 1204)          Glade Lloyd, MD [facility] 11/16/2023, 7:56 AM

## 2023-11-16 NOTE — Progress Notes (Signed)
 Physical Therapy Treatment Patient Details Name: Tina Hester MRN: [id number] DOB: [birth year] Today's Date: 11/16/2023   History of Present Illness Pt is 83 yo female who presents on 11/08/23 with NSTEMI and LLE ulcer. Pt underwent cardiac cath but not a candidate for and surgical intervention. PMH: DM II, GERD, LBP, Glaucoma, CHF, aortic stenosis, aortic valve disorder, gout, OSA, HTN, HLD, Afib, CKD III, hypothyroidism, TIA, lupus, CAD, PAD, anxiety, dementia.    PT Comments  Pt resting in bed on arrival and agreeable to session with slow but steady progress towards acute goals. Pt continues to be limited by LLE pain resulting in decreased weight bearing tolerance on L and limited tolerance of standing activity. Pt able to come to sitting EOB with min A to elevate trunk and increased cue to scoot out to EOB. Pt requiring min A to boost to stand and step pivot to chair. Pt declining ambulation attempts due to pain however able to complete pre-gait activities. Pt up in chair at end of session. Pt continues to benefit from skilled PT services to progress toward functional mobility goals.      If plan is discharge home, recommend the following: A little help with walking and/or transfers;A little help with bathing/dressing/bathroom;Assistance with cooking/housework;Assist for transportation;Help with stairs or ramp for entrance   Can travel by private vehicle        Equipment Recommendations  None recommended by PT    Recommendations for Other Services       Precautions / Restrictions Precautions Precautions: Fall Recall of Precautions/Restrictions: Impaired Precaution/Restrictions Comments: blind R eye Restrictions Weight Bearing Restrictions Per Provider Order: No     Mobility  Bed Mobility Overal bed mobility: Needs Assistance Bed Mobility: Supine to Sit     Supine to sit: Min assist     General bed mobility comments: min A for elevation of trunk and bring LLE to and off  EOB, assist and increased cues to scoot out to EOB    Transfers Overall transfer level: Needs assistance Equipment used: Rolling walker (2 wheels) Transfers: Sit to/from Stand, Bed to chair/wheelchair/BSC Sit to Stand: Min assist   Step pivot transfers: Min assist       General transfer comment: light min A to boost to stand from EOB, able to rise from chair x3 without assist, min A to steady during step pivot    Ambulation/Gait             Pre-gait activities: marching in place x10 General Gait Details: pt declining attmepts despite encouragement   Stairs             Wheelchair Mobility     Tilt Bed    Modified Rankin (Stroke Patients Only)       Balance Overall balance assessment: Needs assistance Sitting-balance support: Feet supported, Single extremity supported Sitting balance-Leahy Scale: Fair Sitting balance - Comments: close guarding for safety   Standing balance support: Bilateral upper extremity supported, During functional activity Standing balance-Leahy Scale: Poor Standing balance comment: needs BUE support                            Communication Communication Communication: No apparent difficulties  Cognition Arousal: Alert Behavior During Therapy: WFL for tasks assessed/performed   PT - Cognitive impairments: History of cognitive impairments                       PT - Cognition Comments: STM  deficits but pt able to follow commands Following commands: Intact      Cueing Cueing Techniques: Verbal cues, Tactile cues  Exercises      General Comments General comments (skin integrity, edema, etc.): VSS on R      Pertinent Vitals/Pain Pain Assessment Pain Assessment: Faces Faces Pain Scale: Hurts even more Pain Location: buttocks, LLE Pain Descriptors / Indicators: Aching, Constant, Grimacing, Sore Pain Intervention(s): Monitored during session, Limited activity within patient's tolerance, Repositioned     Home Living                          Prior Function            PT Goals (current goals can now be found in the care plan section) Acute Rehab PT Goals Patient Stated Goal: return home PT Goal Formulation: With patient/family Time For Goal Achievement: 11/29/23 Progress towards PT goals: Progressing toward goals    Frequency    Min 2X/week      PT Plan      Co-evaluation              AM-PAC PT "6 Clicks" Mobility   Outcome Measure  Help needed turning from your back to your side while in a flat bed without using bedrails?: A Little Help needed moving from lying on your back to sitting on the side of a flat bed without using bedrails?: A Little Help needed moving to and from a bed to a chair (including a wheelchair)?: A Little Help needed standing up from a chair using your arms (e.g., wheelchair or bedside chair)?: A Little Help needed to walk in hospital room?: A Lot Help needed climbing 3-5 steps with a railing? : Total 6 Click Score: 15    End of Session Equipment Utilized During Treatment: Gait belt Activity Tolerance: Patient tolerated treatment well Patient left: in chair;with call bell/phone within reach;with chair alarm set Nurse Communication: Mobility status PT Visit Diagnosis: Unsteadiness on feet (R26.81);Muscle weakness (generalized) (M62.81);Difficulty in walking, not elsewhere classified (R26.2);Pain Pain - Right/Left: Left Pain - part of body: Leg     Time: 0950-1008 PT Time Calculation (min) (ACUTE ONLY): 18 min  Charges:    $Therapeutic Activity: 8-22 mins PT General Charges $$ ACUTE PT VISIT: 1 Visit                     Yareth Kearse R. PTA Acute Rehabilitation Services Office: [phone]   Catalina Antigua 11/16/2023, 11:17 AM

## 2023-11-16 NOTE — Plan of Care (Signed)
  Problem: Nutritional: Goal: Maintenance of adequate nutrition will improve Outcome: Progressing   Problem: Education: Goal: Knowledge of General Education information will improve Description: Including pain rating scale, medication(s)/side effects and non-pharmacologic comfort measures Outcome: Progressing   Problem: Activity: Goal: Risk for activity intolerance will decrease Outcome: Progressing

## 2023-11-16 NOTE — Evaluation (Signed)
 Occupational Therapy Evaluation Patient Details Name: Tina Hester MRN: [id number] DOB: 1941/05/10 Today's Date: 11/16/2023   History of Present Illness   Pt is 83 yo female who presents on 11/08/23 with NSTEMI and LLE ulcer. Pt underwent cardiac cath but not a candidate for and surgical intervention. PMH: DM II, GERD, LBP, Glaucoma, CHF, aortic stenosis, aortic valve disorder, gout, OSA, HTN, HLD, Afib, CKD III, hypothyroidism, TIA, lupus, CAD, PAD, anxiety, dementia.     Clinical Impressions Pt at this time presented with daughter in the room and reported they have not ambulated since about a month ago when working with [facility] PT. However, pt is very fearful of falling and pain and has been completion of step pivot to surfaces in the home with family/caregiver assist present. Pt and daughter reported that they feel like they do not have further DME needs at this time. She was able to completed side stepping at the EOB with CGA to min assist and reaching tasks while sitting at EOB with supervision to CGA as pt reported they can not tolerate sitting up in the chair. At this time recommendation for [facility] with 24/7 support.     If plan is discharge home, recommend the following:   A little help with walking and/or transfers;A little help with bathing/dressing/bathroom;Assistance with cooking/housework;Assist for transportation     Functional Status Assessment   Patient has had a recent decline in their functional status and demonstrates the ability to make significant improvements in function in a reasonable and predictable amount of time.     Equipment Recommendations   None recommended by OT     Recommendations for Other Services         Precautions/Restrictions   Precautions Precautions: Fall Recall of Precautions/Restrictions: Impaired Precaution/Restrictions Comments: blind R eye Restrictions Weight Bearing Restrictions Per Provider Order: No     Mobility Bed  Mobility Overal bed mobility: Needs Assistance Bed Mobility: Supine to Sit     Supine to sit: Contact guard, HOB elevated, Used rails          Transfers Overall transfer level: Needs assistance Equipment used: Rolling walker (2 wheels) Transfers: Sit to/from Stand Sit to Stand: Contact guard assist, Min assist     Step pivot transfers: Contact guard assist, Min assist            Balance Overall balance assessment: Needs assistance Sitting-balance support: Feet supported, Bilateral upper extremity supported Sitting balance-Leahy Scale: Fair     Standing balance support: Bilateral upper extremity supported Standing balance-Leahy Scale: Poor Standing balance comment: needs BUE support                           ADL either performed or assessed with clinical judgement   ADL Overall ADL's : Needs assistance/impaired Eating/Feeding: Independent;Sitting   Grooming: Wash/dry hands;Wash/dry face;Set up;Sitting   Upper Body Bathing: Contact guard assist;Sitting   Lower Body Bathing: Moderate assistance;Sit to/from stand;Sitting/lateral leans   Upper Body Dressing : Contact guard assist;Sitting   Lower Body Dressing: Moderate assistance;Sit to/from stand;Sitting/lateral leans   Toilet Transfer: Minimal assistance;Rolling walker (2 wheels);Cueing for sequencing;Cueing for safety   Toileting- Clothing Manipulation and Hygiene: Maximal assistance;Sit to/from stand       Functional mobility during ADLs: Contact guard assist;Minimal assistance;Rolling walker (2 wheels)       Vision Patient Visual Report:  (blind in R eye)       Perception  Praxis         Pertinent Vitals/Pain Pain Assessment Pain Assessment: Faces Faces Pain Scale: Hurts even more Pain Location: buttocks, LLE Pain Descriptors / Indicators: Aching, Constant, Grimacing, Sore Pain Intervention(s): Limited activity within patient's tolerance, Monitored during session,  Repositioned     Extremity/Trunk Assessment Upper Extremity Assessment Upper Extremity Assessment: Generalized weakness   Lower Extremity Assessment Lower Extremity Assessment: Defer to PT evaluation   Cervical / Trunk Assessment Cervical / Trunk Assessment: Kyphotic   Communication Communication Communication: No apparent difficulties   Cognition Arousal: Alert Behavior During Therapy: WFL for tasks assessed/performed Cognition: Difficult to assess             OT - Cognition Comments: Pt noted to change answers in session with some questions on completion of tasks                 Following commands: Intact       Cueing  General Comments   Cueing Techniques: Verbal cues;Tactile cues  VSS on R   Exercises     Shoulder Instructions      Home Living Family/patient expects to be discharged to:: Private residence Living Arrangements: Children Available Help at Discharge: Available 24 hours/day Type of Home: House Home Access: Stairs to enter [employer] of Steps: 4 Entrance Stairs-Rails: Right;Left;Can reach both Home Layout: Two level;Able to live on main level with bedroom/bathroom     Bathroom Shower/Tub: Walk-in shower         Home Equipment: Agricultural consultant (2 wheels);Shower seat;Wheelchair Financial trader (4 wheels);BSC/3in1   Additional Comments: pt lives with daughter, has caregiver 2 days/wk per pt report      Prior Functioning/Environment Prior Level of Function : Needs assist       Physical Assist : Mobility (physical);ADLs (physical) Mobility (physical): Transfers;Gait ADLs (physical): Bathing;Dressing Mobility Comments: Pt reported last ambulate about a month ago as fearful of falling and was mostly transfering from [facility] with step pivot ADLs Comments: assisted    OT Problem List: Decreased range of motion;Decreased activity tolerance;Impaired balance (sitting and/or standing);Decreased safety awareness;Decreased  knowledge of use of DME or AE;Pain   OT Treatment/Interventions: Self-care/ADL training;Energy conservation;DME and/or AE instruction;Therapeutic activities;Patient/family education;Balance training      OT Goals(Current goals can be found in the care plan section)   Acute Rehab OT Goals Patient Stated Goal: to go home with [facility] OT Goal Formulation: With patient Time For Goal Achievement: 12/01/23 Potential to Achieve Goals: Good   OT Frequency:  Min 1X/week    Co-evaluation              AM-PAC OT "6 Clicks" Daily Activity     Outcome Measure Help from another person eating meals?: None Help from another person taking care of personal grooming?: A Little Help from another person toileting, which includes using toliet, bedpan, or urinal?: A Lot Help from another person bathing (including washing, rinsing, drying)?: A Little Help from another person to put on and taking off regular upper body clothing?: A Lot Help from another person to put on and taking off regular lower body clothing?: A Lot 6 Click Score: 16   End of Session Equipment Utilized During Treatment: Gait belt;Rolling walker (2 wheels) Nurse Communication: Mobility status  Activity Tolerance: Patient tolerated treatment well Patient left: in bed;with call bell/phone within reach;with bed alarm set;with family/visitor present (family present and requested if they go to have pt lay back in bed)  OT Visit Diagnosis: Unsteadiness on feet (R26.81);Other  abnormalities of gait and mobility (R26.89);Repeated falls (R29.6);Muscle weakness (generalized) (M62.81);Pain Pain - Right/Left: Left Pain - part of body:  (bottom)                Time: 1610-9604 OT Time Calculation (min): 29 min Charges:  OT General Charges $OT Visit: 1 Visit OT Evaluation $OT Eval Low Complexity: 1 Low OT Treatments $Self Care/Home Management : 8-22 mins  Presley Raddle OTR/L  Acute Rehab Services  [phone] office number   Alphia Moh 11/16/2023, 1:46 PM

## 2023-11-16 NOTE — Progress Notes (Signed)
 Mobility Specialist Progress Note:   11/16/23 1106  Mobility  Activity Transferred from chair to bed  Level of Assistance Moderate assist, patient does 50-74%  Assistive Device Other (Comment) (HHA)  Distance Ambulated (ft) 5 ft  Activity Response Tolerated well  Mobility Referral Yes  Mobility Specialist Start Time (ACUTE ONLY) 1100  Mobility Specialist Stop Time (ACUTE ONLY) 1106  Mobility Specialist Time Calculation (min) (ACUTE ONLY) 6 min   Pt received in chair, requesting assistance to return back to bed d/t pain in buttocks. Required ModA to stand with HHA. Tolerated well, when lying down pt reported dizziness. BP 131/53 (73). Dizziness subsided. Left with all needs met, call bell in reach, bed alarm on.   Feliciana Rossetti Mobility Specialist Please contact via Special educational needs teacher or  Rehab office at [phone]

## 2023-11-17 DIAGNOSIS — I214 Non-ST elevation (NSTEMI) myocardial infarction: Secondary | ICD-10-CM | POA: Diagnosis not present

## 2023-11-17 LAB — CBC
HCT: 30.5 % — ABNORMAL LOW (ref 36.0–46.0)
Hemoglobin: 9.7 g/dL — ABNORMAL LOW (ref 12.0–15.0)
MCH: 27.2 pg (ref 26.0–34.0)
MCHC: 31.8 g/dL (ref 30.0–36.0)
MCV: 85.4 fL (ref 80.0–100.0)
Platelets: 162 10*3/uL (ref 150–400)
RBC: 3.57 MIL/uL — ABNORMAL LOW (ref 3.87–5.11)
RDW: 15.5 % (ref 11.5–15.5)
WBC: 8 10*3/uL (ref 4.0–10.5)
nRBC: 0 % (ref 0.0–0.2)

## 2023-11-17 LAB — BASIC METABOLIC PANEL
Anion gap: 6 (ref 5–15)
BUN: 17 mg/dL (ref 8–23)
CO2: 22 mmol/L (ref 22–32)
Calcium: 8 mg/dL — ABNORMAL LOW (ref 8.9–10.3)
Chloride: 107 mmol/L (ref 98–111)
Creatinine, Ser: 1.2 mg/dL — ABNORMAL HIGH (ref 0.44–1.00)
GFR, Estimated: 45 mL/min — ABNORMAL LOW (ref >60)
Glucose, Bld: 170 mg/dL — ABNORMAL HIGH (ref 70–99)
Potassium: 4.2 mmol/L (ref 3.5–5.1)
Sodium: 135 mmol/L (ref 135–145)

## 2023-11-17 LAB — GLUCOSE, CAPILLARY
Glucose-Capillary: 148 mg/dL — ABNORMAL HIGH (ref 70–99)
Glucose-Capillary: 128 mg/dL — ABNORMAL HIGH (ref 70–99)
Glucose-Capillary: 162 mg/dL — ABNORMAL HIGH (ref 70–99)

## 2023-11-17 LAB — MAGNESIUM: Magnesium: 1.9 mg/dL (ref 1.7–2.4)

## 2023-11-17 MED ORDER — GABAPENTIN 300 MG PO CAPS: 300.0000 mg | ORAL_CAPSULE | Freq: Every day | ORAL | Status: DC

## 2023-11-17 MED ORDER — ROSUVASTATIN CALCIUM 20 MG PO TABS: 20.0000 mg | ORAL_TABLET | Freq: Every day | ORAL | 0 refills | Status: DC

## 2023-11-17 MED ORDER — METOPROLOL TARTRATE 50 MG PO TABS: 25.0000 mg | ORAL_TABLET | Freq: Two times a day (BID) | ORAL | Status: DC

## 2023-11-17 MED ORDER — POLYETHYLENE GLYCOL 3350 17 G PO PACK: 17.0000 g | PACK | Freq: Every day | ORAL | 0 refills | Status: DC | PRN

## 2023-11-17 MED ORDER — ASPIRIN 81 MG PO TBEC: 81.0000 mg | DELAYED_RELEASE_TABLET | Freq: Every day | ORAL | 0 refills | Status: DC

## 2023-11-17 NOTE — Plan of Care (Signed)
 Palliative- Chart reviewed.  Noted patient has orders in for discharge today. TOC order placed for outpatient palliative referral on 3/18.   Ocie Bob, AGNP-C Palliative Medicine  No charge

## 2023-11-17 NOTE — Progress Notes (Signed)
 Tobey Bride to be D/C'd Home per MD order.  Discussed with the patient and all questions fully answered.  VSS, Skin clean, dry and intact without evidence of skin break down, no evidence of skin tears noted. IV catheter discontinued intact. Site without signs and symptoms of complications. Dressing and pressure applied.  An After Visit Summary was printed and given to the patient. Patient received prescription.  D/c education completed with patient/family including follow up instructions, medication list, d/c activities limitations if indicated, with other d/c instructions as indicated by MD - patient able to verbalize understanding, all questions fully answered.   Patient instructed to return to ED, call 911, or call MD for any changes in condition.   Patient escorted via WC, and D/C home via private auto.  Selena Batten Pearly [place] 11/17/2023 3:04 PM

## 2023-11-17 NOTE — Progress Notes (Signed)
 Mobility Specialist Progress Note:    11/17/23 0954  Mobility  Activity Transferred from bed to chair  Level of Assistance Moderate assist, patient does 50-74%  Assistive Device Front wheel walker  Distance Ambulated (ft) 3 ft  Activity Response Tolerated well  Mobility Referral Yes  Mobility visit 1 Mobility  Mobility Specialist Start Time (ACUTE ONLY) 0945  Mobility Specialist Stop Time (ACUTE ONLY) 0954  Mobility Specialist Time Calculation (min) (ACUTE ONLY) 9 min   Pt received in bed, agreeable to mobility session. Tranferred B>C via ModA and RW. Tolerated well, c/o knee pain throughout, VSS. Pt sitting up in chair with all needs met, call bell in reach, chair alarm on.   Feliciana Rossetti Mobility Specialist Please contact via Special educational needs teacher or  Rehab office at [phone]

## 2023-11-17 NOTE — Discharge Summary (Signed)
 Physician Discharge Summary  Tina Hester BJY:[id number] DOB: 08/02/1941 DOA: 11/10/2023  PCP: [name], MD  Admit date: 11/10/2023 Discharge date: 11/17/2023  Admitted From: Home Disposition: Home  Recommendations for Outpatient Follow-up:  Follow up with PCP in 1 week with repeat CBC/BMP Outpatient follow-up with cardiology/vascular surgery/palliative care Follow up in ED if symptoms worsen or new appear   Home Health: Home health PT/OT Equipment/Devices: None  Discharge Condition: Guarded CODE STATUS: Full Diet recommendation: Heart healthy  Brief/Interim Summary: 83 y.o. female with medical history significant of hypertension, hyperlipidemia, persistent atrial fibrillation, peripheral vascular disease, diabetes mellitus type 2, hypothyroidism, dementia, and GERD presented with chest pain and worsening shortness of breath.  Patient recently developed an ulcer in the left leg and was seen by vascular surgery recently with plans for an angiogram on 3/14. Patient was found to have elevated troponin levels and was admitted for non-STEMI.  Cardiology and vascular surgery was consulted.  She underwent cardiac catheterization on 11/10/2023 which showed significant coronary artery disease and cardiology recommended medical management.  She underwent lower extremity angiogram on 11/11/2023.  Patient/family want to hold off on any procedures at this time.  Subsequently, her condition has remained stable.  Cardiology and vascular surgery have signed off.  Palliative care recommended outpatient palliative care follow-up.  She feels okay to go home today.  She will be discharged home today with home health PT/OT.  Discharge Diagnoses:   NSTEMI -Underwent cardiac catheterization which showed significant coronary artery disease but not thought to be a candidate for any intervention.  Echo as below.  Medical management recommended by cardiology.  Cardiology signed off on 11/12/2023.  Outpatient  follow-up with cardiology -Currently chest pain-free -Continue aspirin, beta-blocker, Zetia and statin.  LDL was 56.   Acute respiratory failure with hypoxia -Initially required supplemental oxygen. -Echocardiogram showed LVEF of 60 to 65%.   Treated with IV Lasix by cardiology but subsequently Lasix was held due to elevated creatinine.   -Respiratory status is stable.  Saturating normal on room air now.   Acute on chronic kidney disease stage IIIb-abnormal UA/concern for UTI -Baseline creatinine is around 1.4.   -Treated with IV fluids and diuretics were held. -Creatinine now at baseline, 1.20 today. Creatinine gradually improved and now back to baseline.   -UA suggestive of possible UTI.  Currently on Rocephin: Complete 5-day course.  Urine cultures grew multiple species   Hypokalemia -Resolved    left leg ulcer/peripheral artery disease -Recent ABIs noted with concern raised for left lower extremity ischemia.   -Underwent lower extremity angiogram on 3/14.  CFA occlusion was noted.  Ideally needs endarterectomy would be a high risk procedure due to her other comorbidities. -Vascular surgery had multiple discussions with patient and family.  At this time they would like to hold off on any procedures.   -Continue current pain management. -Outpatient follow-up with vascular surgery   Permanent atrial fibrillation -Continue metoprolol and Eliquis.     Normocytic anemia/anemia of chronic disease -From chronic illnesses.  Hemoglobin mostly stable.  No evidence of overt bleeding.  Continue to monitor intermittently.   Diabetes mellitus type 2, uncontrolled with hyperglycemia -Last HbA1c is 8 from September 2024.  Carb modified diet.  Resume home regimen.  Essential hypertension Was noted to have low blood pressures.  Was on metoprolol 4 times a day which was decreased to twice daily.  Blood pressure seems to have improved.  Outpatient follow-up.  Will hold olmesartan.    Hyperlipidemia Continue statin and  Zetia.  Was not on statin prior to admission.   History of dementia Continue donepezil.   Hypothyroidism Continue levothyroxine.   Obesity class I -Outpatient follow-up   Goals of care -Overall prognosis is guarded to poor.  Palliative care recommended outpatient follow-up with palliative care.  Remains full code.   Discharge Instructions  Discharge Instructions     Ambulatory referral to Cardiology   Complete by: As directed    Diet - low sodium heart healthy   Complete by: As directed    Diet Carb Modified   Complete by: As directed    Discharge wound care:   Complete by: As directed    As per vascular surgery recommendations   Increase activity slowly   Complete by: As directed       Allergies as of 11/17/2023       Reactions   Pneumococcal Vaccine Anaphylaxis   weakness   Sulfa Antibiotics Hives   Pregabalin Other (See Comments)   Other reaction(s): depression, patient unsure        Medication List     STOP taking these medications    ALPRAZolam 0.25 MG tablet Commonly known as: XANAX   ciprofloxacin 500 MG tablet Commonly known as: CIPRO   mupirocin ointment 2 % Commonly known as: BACTROBAN   olmesartan 20 MG tablet Commonly known as: BENICAR       TAKE these medications    acetaminophen 325 MG tablet Commonly known as: TYLENOL Take 650 mg by mouth every 4 (four) hours as needed for mild pain (pain score 1-3) or moderate pain (pain score 4-6).   apixaban 5 MG Tabs tablet Commonly known as: Eliquis Take 1 tablet (5 mg total) by mouth 2 (two) times daily.   aspirin EC 81 MG tablet Take 1 tablet (81 mg total) by mouth daily. Swallow whole. Start taking on: November 18, 2023   Calmoseptine 0.44-20.6 % Oint Generic drug: Menthol-Zinc Oxide Apply 1 Application topically daily as needed (Skin irritation).   donepezil 5 MG tablet Commonly known as: ARICEPT Take 5 mg by mouth daily.   ezetimibe 10 MG  tablet Commonly known as: ZETIA Take 10 mg by mouth every evening.   gabapentin 300 MG capsule Commonly known as: NEURONTIN Take 1 capsule (300 mg total) by mouth at bedtime. What changed: how much to take   HYDROcodone-acetaminophen 5-325 MG tablet Commonly known as: NORCO/VICODIN Take 1 tablet by mouth every 6 (six) hours as needed for moderate pain (pain score 4-6).   insulin lispro 100 UNIT/ML injection Commonly known as: HUMALOG Inject 1-7 Units into the skin 3 (three) times daily before meals. Sliding scale 141-180=1 unit,181-220= 2 units,221-260= 3 units,261-300=4 units, 301-340=5 units, 341-380=6 units, 381-400 = 7 units,> 400 call MD   Jardiance 10 MG Tabs tablet Generic drug: empagliflozin Take 10 mg by mouth daily.   levothyroxine 150 MCG tablet Commonly known as: SYNTHROID Take 150 mcg by mouth daily before breakfast.   metoprolol tartrate 50 MG tablet Commonly known as: LOPRESSOR Take 0.5 tablets (25 mg total) by mouth 2 (two) times daily.   NovoLIN N FlexPen 100 UNIT/ML FlexPen Generic drug: Insulin NPH (Human) (Isophane) Inject 15 units at morning and 18 units at night What changed:  how much to take how to take this when to take this additional instructions   omeprazole 20 MG capsule Commonly known as: PRILOSEC Take 20 mg by mouth daily as needed (acid reflux).   Ozempic (0.25 or 0.5 MG/DOSE) 2 MG/3ML Sopn Generic  drug: Semaglutide(0.25 or 0.5MG /DOS) Inject 0.25 mg into the skin once a week. Sundays   polyethylene glycol 17 g packet Commonly known as: MIRALAX / GLYCOLAX Take 17 g by mouth daily as needed.   rosuvastatin 20 MG tablet Commonly known as: CRESTOR Take 1 tablet (20 mg total) by mouth daily. Start taking on: November 18, 2023   sertraline 50 MG tablet Commonly known as: ZOLOFT Take 50 mg by mouth daily.               Discharge Care Instructions  (From admission, onward)           Start     Ordered   11/17/23 0000   Discharge wound care:       Comments: As per vascular surgery recommendations   11/17/23 1048            Follow-up Information     [name], MD. Schedule an appointment as soon as possible for a visit in 1 week(s).   Specialty: Family Medicine Contact information: [address] [place] [address] [phone]         Leonie Douglas, MD. Schedule an appointment as soon as possible for a visit.   Specialties: Vascular Surgery, Interventional Cardiology Contact information: [address] [place] [address] [phone]                Allergies  Allergen Reactions   Pneumococcal Vaccine Anaphylaxis    weakness   Sulfa Antibiotics Hives   Pregabalin Other (See Comments)    Other reaction(s): depression, patient unsure    Consultations: Cardiology/vascular surgery/palliative care   Procedures/Studies: DG CHEST PORT 1 VIEW Result Date: 11/13/2023 CLINICAL DATA:  Shortness of breath EXAM: PORTABLE CHEST 1 VIEW COMPARISON:  11/10/2023 FINDINGS: Heart and mediastinal contours within normal limits. No confluent airspace opacity or effusion. No acute bony abnormality. IMPRESSION: No active disease. Electronically Signed   By: Charlett Nose M.D.   On: 11/13/2023 23:38   PERIPHERAL VASCULAR CATHETERIZATION Result Date: 11/11/2023 Table formatting from the original result was not included. DATE OF SERVICE: 11/11/2023  PATIENT:  Tina Hester  83 y.o. female  PRE-OPERATIVE DIAGNOSIS:  Atherosclerosis of native arteries of left lower extremity causing ulceration  POST-OPERATIVE DIAGNOSIS:  Same  PROCEDURE:  1) Ultrasound guided right common femoral artery access 2) Aortogram 3) Left lower extremity angiogram with second order cannulation (30mL total contrast)   SURGEON:  Rande Brunt. Lenell Antu, MD  ASSISTANT: none  ANESTHESIA:   local  ESTIMATED BLOOD LOSS: minimal  LOCAL MEDICATIONS USED:  LIDOCAINE  COUNTS: confirmed correct.  PATIENT DISPOSITION:   PACU - hemodynamically stable.  Delay start of Pharmacological VTE agent (>24hrs) due to surgical blood loss or risk of bleeding: no  INDICATION FOR PROCEDURE: Tina Hester is a 83 y.o. female with left lower extremity ischemic ulceration. After careful discussion of risks, benefits, and alternatives the patient was offered angiography. The patient's family understood and wished to proceed.  OPERATIVE FINDINGS:  Left renal artery: patent Right renal artery: patent Infrarenal aorta: patent Left common iliac artery: patent; no stenosis as suggested by ultrasound Right common iliac artery: patent Left internal iliac artery: patent Right internal iliac artery: patent Left external iliac artery: patent Right external iliac artery: patent Left common femoral artery: focal occlusion about takeoff of first profunda, extending through the takeoff of secondary profunda and SFA Right common femoral artery: not studied Left profunda femoris artery: two profundas seen beyond CFA occlusion  reconstituting via pelvic collaterals Right profunda femoris artery: not studied Left superficial femoral artery: patent immediately beyond origin / CFA occlusion; patent to knee Right superficial femoral artery: not studied Left popliteal artery: patent Right popliteal artery: not studied Left anterior tibial artery: patent Right anterior tibial artery: not studied Left tibioperoneal trunk: patent Right tibioperoneal trunk: not studied Left peroneal artery: patent Right peroneal artery: not studied Left posterior tibial artery: patent Right posterior tibial artery: not studied Left pedal circulation: not well studied in effort to limit contrast Right pedal circulation: not studied  GLASS score. N/A CFA disease  WIfI score. 2 / 3 / 0. Stage IV.  DESCRIPTION OF PROCEDURE: After identification of the patient in the pre-operative holding area, the patient was transferred to the operating room. The patient was positioned supine on the  operating room table. The groins was prepped and draped in standard fashion. A surgical pause was performed confirming correct patient, procedure, and operative location.  The right groin was anesthetized with subcutaneous injection of 1% lidocaine. Using ultrasound guidance, the right common femoral artery was accessed with micropuncture technique. Fluoroscopy was used to confirm cannulation over the femoral head. The 66F micropuncture sheath was upsized to 82F.  A Benson wire was advanced into the distal aorta. Over the wire an omni flush catheter was advanced to the level of L2. Aortogram was performed - see above for details.  The left common iliac artery was selected with an omniflush catheter and glidewire guidewire. The wire was advanced into the common femoral artery. Over the wire the omni flush catheter was advanced into the external iliac artery. Selective angiography was performed - see above for details.  The sheath was left in place to be removed in the recovery area.  Upon completion of the case instrument and sharps counts were confirmed correct. The patient was transferred to the PACU in good condition. I was present for all portions of the procedure.  PLAN: ASA / Statin. BMT for NSTEMI. Needs L CFA endarterectomy for limb salvage. Will need to strategize with cardiology to optimize her for surgery.  Rande Brunt. Lenell Antu, MD [facility] Phone Number: [phone] 11/11/2023 3:13 PM   ECHOCARDIOGRAM COMPLETE Result Date: 11/10/2023    ECHOCARDIOGRAM REPORT   Patient Name:   Tina Hester Date of Exam: 11/10/2023 Medical Rec #:  [id number]          Height:       60.0 in Accession #:    [id number]         Weight:       189.0 lb Date of Birth:  Mar 02, 1941          BSA:          1.822 m Patient Age:    82 years           BP:           134/73 mmHg Patient Gender: F                  HR:           95 bpm. Exam Location:  Inpatient Procedure: Cardiac Doppler,  Color Doppler and 2D Echo (Both Spectral and Color            Flow Doppler were utilized during procedure). Indications:    CHF  History:        Patient has prior history of Echocardiogram examinations, most  recent 01/05/2022. CAD, TIA, Aortic Valve Disease,                 Arrythmias:Atrial Fibrillation; Risk Factors:Hypertension and                 Diabetes.  Sonographer:    Amy Chionchio Referring Phys: [id number] RONDELL A SMITH IMPRESSIONS  1. Left ventricular ejection fraction, by estimation, is 60 to 65%. The left ventricle has normal function. Left ventricular endocardial border not optimally defined to evaluate regional wall motion. Left ventricular diastolic function could not be evaluated.  2. Right ventricular systolic function is normal. The right ventricular size is normal. There is severely elevated pulmonary artery systolic pressure. The estimated right ventricular systolic pressure is 63.1 mmHg.  3. Right atrial size was mildly dilated.  4. The mitral valve is degenerative. Trivial mitral valve regurgitation. No evidence of mitral stenosis. The mean mitral valve gradient is 4.0 mmHg.  5. The aortic valve is calcified. Aortic valve regurgitation is mild. Aortic valve mean gradient measures 18.0 mmHg. Aortic valve Vmax measures 2.70 m/s. Although the mean AVG and Vmax are more consistent with mild AS, the SVI is low at 20 and DVI very low at 0.24. Findings most consistent with low flow low gradient severe AS.  6. The inferior vena cava is normal in size with <50% respiratory variability, suggesting right atrial pressure of 8 mmHg. FINDINGS  Left Ventricle: Left ventricular ejection fraction, by estimation, is 60 to 65%. The left ventricle has normal function. Left ventricular endocardial border not optimally defined to evaluate regional wall motion. The left ventricular internal cavity size was normal in size. There is no left ventricular hypertrophy. Left ventricular diastolic function  could not be evaluated. Right Ventricle: The right ventricular size is normal. No increase in right ventricular wall thickness. Right ventricular systolic function is normal. There is severely elevated pulmonary artery systolic pressure. The tricuspid regurgitant velocity is 3.71 m/s, and with an assumed right atrial pressure of 8 mmHg, the estimated right ventricular systolic pressure is 63.1 mmHg. Left Atrium: Left atrial size was normal in size. Right Atrium: Right atrial size was mildly dilated. Pericardium: There is no evidence of pericardial effusion. Mitral Valve: The mitral valve is degenerative in appearance. There is mild thickening of the mitral valve leaflet(s). There is mild calcification of the mitral valve leaflet(s). Mild to moderate mitral annular calcification. Trivial mitral valve regurgitation. No evidence of mitral valve stenosis. MV peak gradient, 8.1 mmHg. The mean mitral valve gradient is 4.0 mmHg. Tricuspid Valve: The tricuspid valve is normal in structure. Tricuspid valve regurgitation is mild . No evidence of tricuspid stenosis. Aortic Valve: The aortic valve is calcified. Aortic valve regurgitation is mild. Aortic regurgitation PHT measures 429 msec. Severe aortic stenosis is present. Aortic valve mean gradient measures 18.0 mmHg. Aortic valve peak gradient measures 29.2 mmHg. Aortic valve area, by VTI measures 0.68 cm. Pulmonic Valve: The pulmonic valve was normal in structure. Pulmonic valve regurgitation is not visualized. No evidence of pulmonic stenosis. Aorta: The aortic root is normal in size and structure. Venous: The inferior vena cava is normal in size with less than 50% respiratory variability, suggesting right atrial pressure of 8 mmHg. IAS/Shunts: No atrial level shunt detected by color flow Doppler.  LEFT VENTRICLE PLAX 2D LVIDd:         4.00 cm LVIDs:         3.50 cm LV PW:         1.10 cm LV  IVS:        1.10 cm LVOT diam:     1.90 cm LV SV:         37 LV SV Index:   20  LVOT Area:     2.84 cm  LV Volumes (MOD) LV vol d, MOD A2C: 57.7 ml LV vol d, MOD A4C: 67.6 ml LV vol s, MOD A2C: 20.7 ml LV vol s, MOD A4C: 28.1 ml LV SV MOD A2C:     37.0 ml LV SV MOD A4C:     67.6 ml LV SV MOD BP:      39.6 ml RIGHT VENTRICLE          IVC RV Basal diam:  3.80 cm  IVC diam: 2.00 cm RV Mid diam:    3.10 cm TAPSE (M-mode): 1.3 cm LEFT ATRIUM             Index        RIGHT ATRIUM           Index LA Vol (A2C):   46.2 ml 25.38 ml/m  RA Area:     19.10 cm LA Vol (A4C):   46.8 ml 25.68 ml/m  RA Volume:   56.10 ml  30.79 ml/m LA Biplane Vol: 50.5 ml 27.71 ml/m  AORTIC VALVE                     PULMONIC VALVE AV Area (Vmax):    0.79 cm      PV Vmax:       0.93 m/s AV Area (Vmean):   0.73 cm      PV Peak grad:  3.4 mmHg AV Area (VTI):     0.68 cm AV Vmax:           270.00 cm/s AV Vmean:          198.000 cm/s AV VTI:            0.538 m AV Peak Grad:      29.2 mmHg AV Mean Grad:      18.0 mmHg LVOT Vmax:         75.00 cm/s LVOT Vmean:        51.300 cm/s LVOT VTI:          0.129 m LVOT/AV VTI ratio: 0.24 AI PHT:            429 msec  AORTA Ao Root diam: 2.40 cm Ao Asc diam:  2.90 cm MITRAL VALVE              TRICUSPID VALVE MV Area (PHT): 2.37 cm   TR Peak grad:   55.1 mmHg MV Area VTI:   1.45 cm   TR Vmax:        371.00 cm/s MV Peak grad:  8.1 mmHg MV Mean grad:  4.0 mmHg   SHUNTS MV Vmax:       1.42 m/s   Systemic VTI:  0.13 m MV Vmean:      90.7 cm/s  Systemic Diam: 1.90 cm Armanda Magic MD Electronically signed by Armanda Magic MD Signature Date/Time: 11/10/2023/5:01:30 PM    Final    CARDIAC CATHETERIZATION Result Date: 11/10/2023   Ost RCA to Prox RCA lesion is 70% stenosed.   Prox RCA to Mid RCA lesion is 40% stenosed.   Ost LM to Mid LM lesion is 70% stenosed.   Prox Cx lesion is 90% stenosed.   Prox Cx to Mid Cx lesion is 40% stenosed.  Dist Cx lesion is 60% stenosed.   Prox LAD lesion is 70% stenosed. Heavily calcified left main with 60-70% eccentric mid stenosis The LAD is large caliber  vessel that courses to the apex. Severe heavily calcified proximal LAD stenosis The large caliber Circumflex is heavily calcified in the proximal and mid vessel. There is severe, focal lesion in the mid Circumflex and a moderate stenosis in the distal AV groove Circumflex The large, dominant RCA is heavily calcified. Severe ostial stenosis LVEDP=5-8 mmHg Recommendations: Severe left main and three vessel CAD in elderly patient with dementia. I would not recommend PCI as a treatment option and would favor medical management of her CAD. She is not a bypass candidate. Would attempt to rate control her atrial fibrillation. Hopefully she will have percutaneous options for treatment of her ischemic left leg. Continue ASA. I would load with Plavix if ok with the Vascular team following her diagnostic PV study tomorrow.   CT Angio Chest Pulmonary Embolism (PE) W or WO Contrast Result Date: 11/10/2023 CLINICAL DATA:  Pulmonary embolism EXAM: CT ANGIOGRAPHY CHEST WITH CONTRAST TECHNIQUE: Multidetector CT imaging of the chest was performed using the standard protocol during bolus administration of intravenous contrast. Multiplanar CT image reconstructions and MIPs were obtained to evaluate the vascular anatomy. RADIATION DOSE REDUCTION: This exam was performed according to the departmental dose-optimization program which includes automated exposure control, adjustment of the mA and/or kV according to patient size and/or use of iterative reconstruction technique. CONTRAST:  75mL OMNIPAQUE IOHEXOL 350 MG/ML SOLN COMPARISON:  None Available. FINDINGS: Cardiovascular: Adequate opacification of the pulmonary arterial tree. No intraluminal filling defect identified to suggest acute pulmonary embolism. Central pulmonary arteries are of normal caliber. Extensive multi-vessel coronary artery calcification. Cardiac size within normal limits. No pericardial effusion. Moderate atherosclerotic calcification within the thoracic aorta. No  aortic aneurysm. Mediastinum/Nodes: No enlarged mediastinal, hilar, or axillary lymph nodes. Thyroid gland, trachea, and esophagus demonstrate no significant findings. Lungs/Pleura: Small bilateral pleural effusions are present. There is diffuse but perihilar predominant ground-glass pulmonary infiltrates with cyst of the septal thickening most in keeping with interstitial and alveolar pulmonary edema. No pneumothorax. Thickening of the peribronchovascular interstitium in keeping with interstitial pulmonary edema. No central obstructing mass. Upper Abdomen: No acute abnormality. Reflux of contrast into the hepatic venous system in keeping with some degree of right heart failure. Musculoskeletal: Degenerative changes are seen within the thoracic spine flowing disc osteophytes throughout thoracic spine in keeping with changes of diffuse idiopathic skeletal hyperostosis. No acute bone abnormality. Review of the MIP images confirms the above findings. IMPRESSION: 1. No pulmonary embolism. 2. Extensive multi-vessel coronary artery calcification. 3. Small bilateral pleural effusions with diffuse but perihilar predominant ground-glass pulmonary infiltrates with cyst of the septal thickening most in keeping with interstitial and alveolar pulmonary edema, likely cardiogenic in nature. Aortic Atherosclerosis (ICD10-I70.0). Electronically Signed   By: Helyn Numbers M.D.   On: 11/10/2023 04:17   DG Chest Port 1 View Result Date: 11/10/2023 CLINICAL DATA:  Dyspnea EXAM: PORTABLE CHEST 1 VIEW COMPARISON:  12/12/2017 FINDINGS: The lungs are symmetrically well expanded. Mild right basilar atelectasis or infiltrate. No pneumothorax or pleural effusion. Mild cardiomegaly. Central pulmonary vascular congestion without overt pulmonary edema. No acute bone abnormality. IMPRESSION: 1. Mild right basilar atelectasis or infiltrate. 2. Mild cardiomegaly. Electronically Signed   By: Helyn Numbers M.D.   On: 11/10/2023 02:59   VAS [place]  LOWER EXTREMITY ARTERIAL DUPLEX Result Date: 11/08/2023 LOWER EXTREMITY ARTERIAL DUPLEX STUDY Patient Name:  Tina Hester  Date of Exam:   11/08/2023 Medical Rec #: [id number]           Accession #:    [id number] Date of Birth: Nov 22, 1940           Patient Gender: F Patient Age:   85 years Exam Location:  [facility] Procedure:      VAS [place] LOWER EXTREMITY ARTERIAL DUPLEX Referring Phys: Heath Lark --------------------------------------------------------------------------------  Indications: Rest pain, and peripheral artery disease. High Risk Factors: Hypertension, hyperlipidemia, Diabetes, no history of                    smoking, coronary artery disease.  Current ABI: right= noncompressible/0.31, left=absent/absent Comparison Study: No prior study Performing Technologist: Gertie Fey MHA, RDMS, RVT, RDCS  Examination Guidelines: A complete evaluation includes B-mode imaging, spectral Doppler, color Doppler, and power Doppler as needed of all accessible portions of each vessel. Bilateral testing is considered an integral part of a complete examination. Limited examinations for reoccurring indications may be performed as noted.   +-----------+--------+-----+---------------+-------------------+--------+ LEFT       PSV cm/sRatioStenosis       Waveform           Comments +-----------+--------+-----+---------------+-------------------+--------+ CIA Mid    285          50-74% stenosismonophasic                  +-----------+--------+-----+---------------+-------------------+--------+ EIA Prox   66                          monophasic                  +-----------+--------+-----+---------------+-------------------+--------+ EIA Mid    340          50-74% stenosismonophasic                  +-----------+--------+-----+---------------+-------------------+--------+ EIA Distal 65                          monophasic                   +-----------+--------+-----+---------------+-------------------+--------+ CFA Distal 73                          monophasic                  +-----------+--------+-----+---------------+-------------------+--------+ DFA        61                          monophasic                  +-----------+--------+-----+---------------+-------------------+--------+ SFA Prox   15                          Dampened monophasic         +-----------+--------+-----+---------------+-------------------+--------+ SFA Mid    17                          dampened monophasic         +-----------+--------+-----+---------------+-------------------+--------+ SFA Distal 14                          dampened monophasic         +-----------+--------+-----+---------------+-------------------+--------+  POP Prox   46                          monophasic                  +-----------+--------+-----+---------------+-------------------+--------+ POP Distal 44                          monophasic                  +-----------+--------+-----+---------------+-------------------+--------+ TP Trunk   26                          dampened monophasic         +-----------+--------+-----+---------------+-------------------+--------+ ATA Prox   20                          dampened monophasic         +-----------+--------+-----+---------------+-------------------+--------+ ATA Mid    23                          dampened monophasic         +-----------+--------+-----+---------------+-------------------+--------+ ATA Distal 23                          dampened monophasic         +-----------+--------+-----+---------------+-------------------+--------+ PTA Prox   24                          dampened monophasic         +-----------+--------+-----+---------------+-------------------+--------+ PTA Mid    30                          dampened monophasic          +-----------+--------+-----+---------------+-------------------+--------+ PTA Distal 26                          dampened monophasic         +-----------+--------+-----+---------------+-------------------+--------+ PERO Mid   24                          dampened monophasic         +-----------+--------+-----+---------------+-------------------+--------+ PERO Distal                            unable to insonate          +-----------+--------+-----+---------------+-------------------+--------+  Summary: Left: 50-74% stenosis noted in the common and external iliac arteries. Heavily calcified common and external iliac arteries. Dampened monophasic flow in lower extremity arterial system.  See table(s) above for measurements and observations. Electronically signed by Heath Lark on 11/08/2023 at 4:42:55 PM.    Final    VAS [place] ABI WITH/WO TBI Result Date: 11/08/2023  LOWER EXTREMITY DOPPLER STUDY Patient Name:  Tina Hester  Date of Exam:   11/08/2023 Medical Rec #: [id number]           Accession #:    [id number] Date of Birth: 1941/03/26           Patient Gender: F Patient Age:   45 years Exam Location:  [facility] Procedure:      VAS [place] ABI  WITH/WO TBI Referring Phys: Heath Lark --------------------------------------------------------------------------------  Indications: Rest pain, and peripheral artery disease. High Risk Factors: Hypertension, hyperlipidemia, Diabetes, no history of                    smoking.  Comparison Study: 08/30/2023 ABI/TBI- right=Noncompressible/0.62,                   left=0.26/absent Performing Technologist: Gertie Fey MHA, RVT, RDCS, RDMS  Examination Guidelines: A complete evaluation includes at minimum, Doppler waveform signals and systolic blood pressure reading at the level of bilateral brachial, anterior tibial, and posterior tibial arteries, when vessel segments are accessible. Bilateral testing is considered an integral part of a complete  examination. Photoelectric Plethysmograph (PPG) waveforms and toe systolic pressure readings are included as required and additional duplex testing as needed. Limited examinations for reoccurring indications may be performed as noted.  ABI Findings: +---------+------------------+-----+----------+--------+ Right    Rt Pressure (mmHg)IndexWaveform  Comment  +---------+------------------+-----+----------+--------+ Brachial 153                                       +---------+------------------+-----+----------+--------+ ATA      Noncompressible        monophasic         +---------+------------------+-----+----------+--------+ PTA      Noncompressible        monophasic         +---------+------------------+-----+----------+--------+ Great Toe51                0.31                    +---------+------------------+-----+----------+--------+ +---------+------------------+-----+------------------+-------+ Left     Lt Pressure (mmHg)IndexWaveform          Comment +---------+------------------+-----+------------------+-------+ Brachial 162                                              +---------+------------------+-----+------------------+-------+ ATA                             unable to insonate        +---------+------------------+-----+------------------+-------+ PTA                             unable to insonate        +---------+------------------+-----+------------------+-------+ Great Toe0                 0.00                           +---------+------------------+-----+------------------+-------+ +-------+---------------+-----------+---------------+------------+ ABI/TBIToday's ABI    Today's TBIPrevious ABI   Previous TBI +-------+---------------+-----------+---------------+------------+ Right  Noncompressible0.31       Noncompressible0.62         +-------+---------------+-----------+---------------+------------+ Left   Absent         Absent      0.26           Absent       +-------+---------------+-----------+---------------+------------+  Right ABIs appear essentially unchanged compared to prior study on 08/30/2023. Left ABIs appear decreased compared to prior study on 08/30/2023.  Summary: Right: Resting right ankle-brachial index indicates noncompressible right lower extremity arteries. The right toe-brachial index is abnormal. Left:  Resting left ankle-brachial index indicates critical left limb ischemia. The left toe-brachial index is abnormal. *See table(s) above for measurements and observations.  Electronically signed by Heath Lark on 11/08/2023 at 4:42:39 PM.    Final       Subjective: Patient seen and examined at bedside.  Denies any current chest pain, vomiting or fever.  Wants to go home today.  Discharge Exam: Vitals:   11/17/23 0407 11/17/23 0817  BP: (!) 146/57 (!) 129/117  Pulse: 82 75  Resp: 15 15  Temp: 98.1 F (36.7 C) 98.6 F (37 C)  SpO2: 98%     General: Pt is alert, awake, not in acute distress.  Looks chronically ill and deconditioned.  Elderly female sitting on chair. Cardiovascular: rate controlled, S1/S2 + Respiratory: bilateral decreased breath sounds at bases with scattered crackles Abdominal: Soft, obese, NT, ND, bowel sounds + Extremities: Trace lower extremity edema; no cyanosis    The results of significant diagnostics from this hospitalization (including imaging, microbiology, ancillary and laboratory) are listed below for reference.     Microbiology: Recent Results (from the past 240 hours)  Resp panel by RT-PCR (RSV, Flu A&B, Covid) Anterior Nasal Swab     Status: None   Collection Time: 11/10/23  1:42 AM   Specimen: Anterior Nasal Swab  Result Value Ref Range Status   SARS Coronavirus 2 by RT PCR NEGATIVE NEGATIVE Final   Influenza A by PCR NEGATIVE NEGATIVE Final   Influenza B by PCR NEGATIVE NEGATIVE Final    Comment: (NOTE) The Xpert Xpress SARS-CoV-2/FLU/RSV plus assay  is intended as an aid in the diagnosis of influenza from Nasopharyngeal swab specimens and should not be used as a sole basis for treatment. Nasal washings and aspirates are unacceptable for Xpert Xpress SARS-CoV-2/FLU/RSV testing.  Fact Sheet for Patients: BloggerCourse.com  Fact Sheet for Healthcare Providers: SeriousBroker.it  This test is not yet approved or cleared by the [place] FDA and has been authorized for detection and/or diagnosis of SARS-CoV-2 by FDA under an Emergency Use Authorization (EUA). This EUA will remain in effect (meaning this test can be used) for the duration of the COVID-19 declaration under Section 564(b)(1) of the Act, 21 U.S.C. section 360bbb-3(b)(1), unless the authorization is terminated or revoked.     Resp Syncytial Virus by PCR NEGATIVE NEGATIVE Final    Comment: (NOTE) Fact Sheet for Patients: BloggerCourse.com  Fact Sheet for Healthcare Providers: SeriousBroker.it  This test is not yet approved or cleared by the [place] FDA and has been authorized for detection and/or diagnosis of SARS-CoV-2 by FDA under an Emergency Use Authorization (EUA). This EUA will remain in effect (meaning this test can be used) for the duration of the COVID-19 declaration under Section 564(b)(1) of the Act, 21 U.S.C. section 360bbb-3(b)(1), unless the authorization is terminated or revoked.  Performed at [facility], [address], [place], [place] [address]   Urine Culture     Status: Abnormal   Collection Time: 11/10/23  5:22 AM   Specimen: Urine, Random  Result Value Ref Range Status   Specimen Description URINE, RANDOM  Final   Special Requests   Final    NONE Reflexed from [id number] Performed at [facility], [address] [place], [place] [address]    Culture MULTIPLE SPECIES PRESENT, SUGGEST RECOLLECTION (A)  Final   Report  Status 11/11/2023 FINAL  Final     Labs: BNP (last 3 results) Recent Labs    11/10/23 0138  BNP 546.3*   Basic Metabolic Panel: Recent Labs  Lab 11/13/23 0824 11/14/23 0402 11/15/23 0408 11/16/23 0308 11/17/23 0307  NA 136 137 136 136 135  K 3.7 4.1 3.8 3.7 4.2  CL 105 106 105 107 107  CO2 20* 21* 21* 23 22  GLUCOSE 95 127* 100* 131* 170*  BUN 36* 30* 25* 22 17  CREATININE 1.73* 1.39* 1.33* 1.28* 1.20*  CALCIUM 7.8* 7.9* 8.1* 8.1* 8.0*  MG  --   --   --   --  1.9   Liver Function Tests: No results for input(s): "AST", "ALT", "ALKPHOS", "BILITOT", "PROT", "ALBUMIN" in the last 168 hours. No results for input(s): "LIPASE", "AMYLASE" in the last 168 hours. No results for input(s): "AMMONIA" in the last 168 hours. CBC: Recent Labs  Lab 11/13/23 0824 11/14/23 0402 11/15/23 0408 11/16/23 0308 11/17/23 0307  WBC 6.9 6.9 7.5 7.0 8.0  HGB 9.9* 10.1* 9.6* 9.8* 9.7*  HCT 31.0* 32.0* 30.6* 30.7* 30.5*  MCV 85.2 85.1 84.8 84.3 85.4  PLT 157 174 171 171 162   Cardiac Enzymes: No results for input(s): "CKTOTAL", "CKMB", "CKMBINDEX", "TROPONINI" in the last 168 hours. BNP: Invalid input(s): "POCBNP" CBG: Recent Labs  Lab 11/16/23 0602 11/16/23 1137 11/16/23 1657 11/16/23 2130 11/17/23 0822  GLUCAP 115* 124* 214* 148* 128*   D-Dimer No results for input(s): "DDIMER" in the last 72 hours. Hgb A1c No results for input(s): "HGBA1C" in the last 72 hours. Lipid Profile No results for input(s): "CHOL", "HDL", "LDLCALC", "TRIG", "CHOLHDL", "LDLDIRECT" in the last 72 hours. Thyroid function studies No results for input(s): "TSH", "T4TOTAL", "T3FREE", "THYROIDAB" in the last 72 hours.  Invalid input(s): "FREET3" Anemia work up No results for input(s): "VITAMINB12", "FOLATE", "FERRITIN", "TIBC", "IRON", "RETICCTPCT" in the last 72 hours. Urinalysis    Component Value Date/Time   COLORURINE STRAW (A) 11/10/2023 0522   APPEARANCEUR HAZY (A) 11/10/2023 0522   LABSPEC  1.014 11/10/2023 0522   PHURINE 5.0 11/10/2023 0522   GLUCOSEU >=500 (A) 11/10/2023 0522   GLUCOSEU NEGATIVE 12/13/2018 1100   HGBUR MODERATE (A) 11/10/2023 0522   BILIRUBINUR NEGATIVE 11/10/2023 0522   KETONESUR NEGATIVE 11/10/2023 0522   PROTEINUR NEGATIVE 11/10/2023 0522   UROBILINOGEN 0.2 12/13/2018 1100   NITRITE NEGATIVE 11/10/2023 0522   LEUKOCYTESUR LARGE (A) 11/10/2023 0522   Sepsis Labs Recent Labs  Lab 11/14/23 0402 11/15/23 0408 11/16/23 0308 11/17/23 0307  WBC 6.9 7.5 7.0 8.0   Microbiology Recent Results (from the past 240 hours)  Resp panel by RT-PCR (RSV, Flu A&B, Covid) Anterior Nasal Swab     Status: None   Collection Time: 11/10/23  1:42 AM   Specimen: Anterior Nasal Swab  Result Value Ref Range Status   SARS Coronavirus 2 by RT PCR NEGATIVE NEGATIVE Final   Influenza A by PCR NEGATIVE NEGATIVE Final   Influenza B by PCR NEGATIVE NEGATIVE Final    Comment: (NOTE) The Xpert Xpress SARS-CoV-2/FLU/RSV plus assay is intended as an aid in the diagnosis of influenza from Nasopharyngeal swab specimens and should not be used as a sole basis for treatment. Nasal washings and aspirates are unacceptable for Xpert Xpress SARS-CoV-2/FLU/RSV testing.  Fact Sheet for Patients: BloggerCourse.com  Fact Sheet for Healthcare Providers: SeriousBroker.it  This test is not yet approved or cleared by the [place] FDA and has been authorized for detection and/or diagnosis of SARS-CoV-2 by FDA under an Emergency Use Authorization (EUA). This EUA will remain in effect (meaning this test can be used)  for the duration of the COVID-19 declaration under Section 564(b)(1) of the Act, 21 U.S.C. section 360bbb-3(b)(1), unless the authorization is terminated or revoked.     Resp Syncytial Virus by PCR NEGATIVE NEGATIVE Final    Comment: (NOTE) Fact Sheet for Patients: BloggerCourse.com  Fact Sheet  for Healthcare Providers: SeriousBroker.it  This test is not yet approved or cleared by the [place] FDA and has been authorized for detection and/or diagnosis of SARS-CoV-2 by FDA under an Emergency Use Authorization (EUA). This EUA will remain in effect (meaning this test can be used) for the duration of the COVID-19 declaration under Section 564(b)(1) of the Act, 21 U.S.C. section 360bbb-3(b)(1), unless the authorization is terminated or revoked.  Performed at [facility], [address] [place], [place] [address]   Urine Culture     Status: Abnormal   Collection Time: 11/10/23  5:22 AM   Specimen: Urine, Random  Result Value Ref Range Status   Specimen Description URINE, RANDOM  Final   Special Requests   Final    NONE Reflexed from [id number] Performed at [facility], [address], [place], [place] [address]    Culture MULTIPLE SPECIES PRESENT, SUGGEST RECOLLECTION (A)  Final   Report Status 11/11/2023 FINAL  Final     Time coordinating discharge: 35 minutes  SIGNED:   Glade Lloyd, MD  [facility] 11/17/2023, 10:49 AM

## 2023-11-17 NOTE — TOC Transition Note (Signed)
 Transition of Care (TOC) - Discharge Note Donn Pierini RN, BSN Transitions of Care Unit 4E- RN Case Manager See Treatment Team for direct phone #   Patient Details  Name: Tina Hester MRN: [id number] Date of Birth: 11-11-1940  Transition of Care [facility]) CM/SW Contact:  Darrold Span, RN Phone Number: 11/17/2023, 1:08 PM   Clinical Narrative:    Pt stable for transition home today, daughter to transport home.  Orders placed for HHPTOT, referral noted for outpt Palliative.   No family present at bedside, pt with hx dementia. Call made to daughter- Tina Hester.  Per TC w/ [name]- discussed HH and outpt Palliative referral. [name] confirmed that they would like both referrals made. Choice offered for [facility] and Palliative. Per [name] they have used [facility] HH in past and would like HH again with them if possible. She states she does not have a preference for palliative outpt follow up.   [name] voiced she was at a doctor's appointment and would come to hospital to transport mom home after that.   Call made to [facility] liaison- for HHPTOT referral- referral has been accepted.   Call made to [facility] liaison for outpt [facility] referral- referral has been accepted and they will follow up post discharge.   No further TOC needs noted.    Final next level of care: Home w Home Health Services Barriers to Discharge: Barriers Resolved   Patient Goals and CMS Choice Patient states their goals for this hospitalization and ongoing recovery are:: return home CMS Medicare.gov Compare Post Acute Care list provided to:: Patient Represenative (must comment) Choice offered to / list presented to : Adult Children (daughter- Tina Hester)      Discharge Placement                 Home w/ [facility]      Discharge Plan and Services Additional resources added to the After Visit Summary for     Discharge Planning Services: CM Consult Post Acute Care Choice: Home Health          DME Arranged: N/A DME  Agency: NA       HH Arranged: PT, OT HH Agency: [facility] Date HH Agency Contacted: 11/17/23 Time HH Agency Contacted: [phone] Representative spoke with at [facility] Agency: Haywood Lasso  Social Drivers of Health (SDOH) Interventions SDOH Screenings   Food Insecurity: No Food Insecurity (11/10/2023)  Housing: Low Risk  (11/10/2023)  Transportation Needs: No Transportation Needs (11/10/2023)  Utilities: Not At Risk (11/10/2023)  Depression (PHQ2-9): Low Risk  (06/23/2020)  Social Connections: Unknown (11/10/2023)  Tobacco Use: Low Risk  (11/10/2023)     Readmission Risk Interventions    11/17/2023    1:08 PM  Readmission Risk Prevention Plan  Transportation Screening Complete  HRI or Home Care Consult Complete  Social Work Consult for Recovery Care Planning/Counseling Complete  Palliative Care Screening Complete  Medication Review Oceanographer) Complete

## 2023-11-18 DIAGNOSIS — R296 Repeated falls: Secondary | ICD-10-CM | POA: Diagnosis not present

## 2023-11-18 DIAGNOSIS — F0390 Unspecified dementia without behavioral disturbance: Secondary | ICD-10-CM | POA: Diagnosis not present

## 2023-11-18 DIAGNOSIS — I509 Heart failure, unspecified: Secondary | ICD-10-CM | POA: Diagnosis not present

## 2023-11-18 DIAGNOSIS — G894 Chronic pain syndrome: Secondary | ICD-10-CM | POA: Diagnosis not present

## 2023-11-21 ENCOUNTER — Encounter (HOSPITAL_BASED_OUTPATIENT_CLINIC_OR_DEPARTMENT_OTHER): Payer: PPO | Attending: General Surgery | Admitting: General Surgery

## 2023-11-21 DIAGNOSIS — E11622 Type 2 diabetes mellitus with other skin ulcer: Secondary | ICD-10-CM | POA: Diagnosis not present

## 2023-11-21 DIAGNOSIS — E11621 Type 2 diabetes mellitus with foot ulcer: Secondary | ICD-10-CM | POA: Diagnosis not present

## 2023-11-21 DIAGNOSIS — N1832 Chronic kidney disease, stage 3b: Secondary | ICD-10-CM | POA: Insufficient documentation

## 2023-11-21 DIAGNOSIS — I509 Heart failure, unspecified: Secondary | ICD-10-CM | POA: Diagnosis not present

## 2023-11-21 DIAGNOSIS — E1122 Type 2 diabetes mellitus with diabetic chronic kidney disease: Secondary | ICD-10-CM | POA: Diagnosis not present

## 2023-11-21 DIAGNOSIS — L97829 Non-pressure chronic ulcer of other part of left lower leg with unspecified severity: Secondary | ICD-10-CM | POA: Diagnosis not present

## 2023-11-21 DIAGNOSIS — L89623 Pressure ulcer of left heel, stage 3: Secondary | ICD-10-CM | POA: Diagnosis not present

## 2023-11-21 DIAGNOSIS — S81802A Unspecified open wound, left lower leg, initial encounter: Secondary | ICD-10-CM | POA: Diagnosis not present

## 2023-11-21 DIAGNOSIS — L97522 Non-pressure chronic ulcer of other part of left foot with fat layer exposed: Secondary | ICD-10-CM | POA: Diagnosis not present

## 2023-11-22 DIAGNOSIS — M8580 Other specified disorders of bone density and structure, unspecified site: Secondary | ICD-10-CM | POA: Diagnosis not present

## 2023-11-22 DIAGNOSIS — D631 Anemia in chronic kidney disease: Secondary | ICD-10-CM | POA: Diagnosis not present

## 2023-11-22 DIAGNOSIS — I5033 Acute on chronic diastolic (congestive) heart failure: Secondary | ICD-10-CM | POA: Diagnosis not present

## 2023-11-22 DIAGNOSIS — I6523 Occlusion and stenosis of bilateral carotid arteries: Secondary | ICD-10-CM | POA: Diagnosis not present

## 2023-11-22 DIAGNOSIS — F0284 Dementia in other diseases classified elsewhere, unspecified severity, with anxiety: Secondary | ICD-10-CM | POA: Diagnosis not present

## 2023-11-22 DIAGNOSIS — E039 Hypothyroidism, unspecified: Secondary | ICD-10-CM | POA: Diagnosis not present

## 2023-11-22 DIAGNOSIS — N179 Acute kidney failure, unspecified: Secondary | ICD-10-CM | POA: Diagnosis not present

## 2023-11-22 DIAGNOSIS — I272 Pulmonary hypertension, unspecified: Secondary | ICD-10-CM | POA: Diagnosis not present

## 2023-11-22 DIAGNOSIS — E1122 Type 2 diabetes mellitus with diabetic chronic kidney disease: Secondary | ICD-10-CM | POA: Diagnosis not present

## 2023-11-22 DIAGNOSIS — I872 Venous insufficiency (chronic) (peripheral): Secondary | ICD-10-CM | POA: Diagnosis not present

## 2023-11-22 DIAGNOSIS — N1832 Chronic kidney disease, stage 3b: Secondary | ICD-10-CM | POA: Diagnosis not present

## 2023-11-22 DIAGNOSIS — I700 Atherosclerosis of aorta: Secondary | ICD-10-CM | POA: Diagnosis not present

## 2023-11-22 DIAGNOSIS — E1165 Type 2 diabetes mellitus with hyperglycemia: Secondary | ICD-10-CM | POA: Diagnosis not present

## 2023-11-22 DIAGNOSIS — L97822 Non-pressure chronic ulcer of other part of left lower leg with fat layer exposed: Secondary | ICD-10-CM | POA: Diagnosis not present

## 2023-11-22 DIAGNOSIS — M1030 Gout due to renal impairment, unspecified site: Secondary | ICD-10-CM | POA: Diagnosis not present

## 2023-11-22 DIAGNOSIS — I130 Hypertensive heart and chronic kidney disease with heart failure and stage 1 through stage 4 chronic kidney disease, or unspecified chronic kidney disease: Secondary | ICD-10-CM | POA: Diagnosis not present

## 2023-11-22 DIAGNOSIS — E1151 Type 2 diabetes mellitus with diabetic peripheral angiopathy without gangrene: Secondary | ICD-10-CM | POA: Diagnosis not present

## 2023-11-22 DIAGNOSIS — I4819 Other persistent atrial fibrillation: Secondary | ICD-10-CM | POA: Diagnosis not present

## 2023-11-22 DIAGNOSIS — J9601 Acute respiratory failure with hypoxia: Secondary | ICD-10-CM | POA: Diagnosis not present

## 2023-11-22 DIAGNOSIS — F02818 Dementia in other diseases classified elsewhere, unspecified severity, with other behavioral disturbance: Secondary | ICD-10-CM | POA: Diagnosis not present

## 2023-11-22 DIAGNOSIS — I2510 Atherosclerotic heart disease of native coronary artery without angina pectoris: Secondary | ICD-10-CM | POA: Diagnosis not present

## 2023-11-22 DIAGNOSIS — I350 Nonrheumatic aortic (valve) stenosis: Secondary | ICD-10-CM | POA: Diagnosis not present

## 2023-11-22 DIAGNOSIS — G4733 Obstructive sleep apnea (adult) (pediatric): Secondary | ICD-10-CM | POA: Diagnosis not present

## 2023-11-22 DIAGNOSIS — I214 Non-ST elevation (NSTEMI) myocardial infarction: Secondary | ICD-10-CM | POA: Diagnosis not present

## 2023-11-23 DIAGNOSIS — Z09 Encounter for follow-up examination after completed treatment for conditions other than malignant neoplasm: Secondary | ICD-10-CM | POA: Diagnosis not present

## 2023-11-23 DIAGNOSIS — I214 Non-ST elevation (NSTEMI) myocardial infarction: Secondary | ICD-10-CM | POA: Diagnosis not present

## 2023-11-23 DIAGNOSIS — D649 Anemia, unspecified: Secondary | ICD-10-CM | POA: Diagnosis not present

## 2023-11-23 DIAGNOSIS — I9589 Other hypotension: Secondary | ICD-10-CM | POA: Diagnosis not present

## 2023-11-30 ENCOUNTER — Encounter (HOSPITAL_BASED_OUTPATIENT_CLINIC_OR_DEPARTMENT_OTHER): Attending: General Surgery | Admitting: General Surgery

## 2023-11-30 DIAGNOSIS — E1122 Type 2 diabetes mellitus with diabetic chronic kidney disease: Secondary | ICD-10-CM | POA: Insufficient documentation

## 2023-11-30 DIAGNOSIS — E1151 Type 2 diabetes mellitus with diabetic peripheral angiopathy without gangrene: Secondary | ICD-10-CM | POA: Insufficient documentation

## 2023-11-30 DIAGNOSIS — I130 Hypertensive heart and chronic kidney disease with heart failure and stage 1 through stage 4 chronic kidney disease, or unspecified chronic kidney disease: Secondary | ICD-10-CM | POA: Diagnosis not present

## 2023-11-30 DIAGNOSIS — L97829 Non-pressure chronic ulcer of other part of left lower leg with unspecified severity: Secondary | ICD-10-CM | POA: Insufficient documentation

## 2023-11-30 DIAGNOSIS — E11621 Type 2 diabetes mellitus with foot ulcer: Secondary | ICD-10-CM | POA: Diagnosis not present

## 2023-11-30 DIAGNOSIS — E11622 Type 2 diabetes mellitus with other skin ulcer: Secondary | ICD-10-CM | POA: Diagnosis not present

## 2023-11-30 DIAGNOSIS — I509 Heart failure, unspecified: Secondary | ICD-10-CM | POA: Diagnosis not present

## 2023-11-30 DIAGNOSIS — L97512 Non-pressure chronic ulcer of other part of right foot with fat layer exposed: Secondary | ICD-10-CM | POA: Diagnosis not present

## 2023-11-30 DIAGNOSIS — L89623 Pressure ulcer of left heel, stage 3: Secondary | ICD-10-CM | POA: Insufficient documentation

## 2023-11-30 DIAGNOSIS — L97822 Non-pressure chronic ulcer of other part of left lower leg with fat layer exposed: Secondary | ICD-10-CM | POA: Diagnosis not present

## 2023-11-30 NOTE — Progress Notes (Deleted)
 Cardiology Office Note    Patient Name: Tina Hester Date of Encounter: 11/30/2023  Primary Care Provider:  [name], MD Primary Cardiologist:  Armanda Magic, MD Primary Electrophysiologist: Regan Lemming, MD   Past Medical History    Past Medical History:  Diagnosis Date   Anemia    s/p Heme work -up normal EGD and colonscopy in 2012 per pt,neg SPEP   Anxiety    Aortic stenosis    Mild by echo 12/2021 with mean aortic valve gradient 17 mmHg   Bilateral carotid artery stenosis 06/08/2015   1-39% bilateral   Bradycardia    CKD (chronic kidney disease), stage III (HCC)    Diabetes mellitus without complication (HCC)    type 2   GERD (gastroesophageal reflux disease)    Gout    HSV-1 (herpes simplex virus 1) infection    Acyclovir prn   Hyperkalemia    Hyperlipidemia    Hypertension    Joint pain    osteoarthritis by Xray- possible erosion of R 4th MCP,elevated uric  acid ,ANA +    MCI (mild cognitive impairment)    Morbid obesity (HCC)    OSA on CPAP    Osteopenia    PAF (paroxysmal atrial fibrillation) (HCC)    Pain management    Neurosurg: Dr Odette Fraction   Pulmonary HTN (HCC)    Moderate by echo  2016. PASP   TIA (transient ischemic attack)    remote history of TIA's early 2000    History of Present Illness  Tina Hester is a 83 y.o. female with a PMH of CAD s/p NSTEMI and LHC with heavy calcified left main calcified proximal LAD as well as heavy calcified RCA, TIA, bilateral carotid artery disease, aortic stenosis, persistent AF, HTN, HLD, PVD, DM type II, hypothyroidism, dementia, GERD who presents today for hospital follow-up.  Tina Hester is currently followed by Dr. Mayford Knife for management of CAD and atrial fibrillation.  Nuclear stress test in 2013 that was low risk.  She was admitted to the ED on 11/2017 with bradycardia and had beta-blockers discontinued.  And wore telemetry monitor in 2019 that showed continued rate control A-fib  in sinus rhythm.  She was started on Multaq and decided to hold off on ablation due to control of symptoms.  She fortunately failed Multaq and was started on Toprol-XL for rate control.  She was seen in the ED on 12/2022 with complaint of foot pain.  She was admitted and underwent studies and was found to be negative for DVT and treated for cellulitis.  She was admitted on 11/10/2023 with complaint of shortness of breath and O2 sats of 87%.  She denied any chest discomfort and troponins were completed and elevated at [phone] and EKG showed atrial fibrillation with LBBB and BNP of 546.  She was given 40 mg of IV Lasix and started on heparin drip.  She was found to have left lateral calf nonhealing ulcer and was evaluated by vascular surgery.  She had known 50% to 74% stenosis in the common and external iliac arteries on the left.  She underwent a left heart cath that showed heavy calcified three-vessel disease with option to treat medically due to debility and dementia.  She underwent a lower extremity aortogram and was determined to need left CFA endarterectomy for limb salvage.  Patient's family elected to hold off on any further procedures and palliative care was consulted with recommendation for care follow-up after discharge.  Patient denies  chest pain, palpitations, dyspnea, PND, orthopnea, nausea, vomiting, dizziness, syncope, edema, weight gain, or early satiety.   Discussed the use of AI scribe software for clinical note transcription with the patient, who gave verbal consent to proceed.  History of Present Illness    ***Notes: -Last ischemic evaluation:  Review of Systems  Please see the history of present illness.    All other systems reviewed and are otherwise negative except as noted above.  Physical Exam    Wt Readings from Last 3 Encounters:  11/17/23 183 lb 13.8 oz (83.4 kg)  07/04/23 189 lb (85.7 kg)  05/08/23 199 lb 15.3 oz (90.7 kg)   ZO:XWRUE were no vitals filed for this  visit.,There is no height or weight on file to calculate BMI. GEN: Well nourished, well developed in no acute distress Neck: No JVD; No carotid bruits Pulmonary: Clear to auscultation without rales, wheezing or rhonchi  Cardiovascular: Normal rate. Regular rhythm. Normal S1. Normal S2.   Murmurs: There is no murmur.  ABDOMEN: Soft, non-tender, non-distended EXTREMITIES:  No edema; No deformity   EKG/LABS/ Recent Cardiac Studies   ECG personally reviewed by me today - ***  Risk Assessment/Calculations:   {Does this patient have ATRIAL FIBRILLATION?[phone]}      Lab Results  Component Value Date   WBC 8.0 11/17/2023   HGB 9.7 (L) 11/17/2023   HCT 30.5 (L) 11/17/2023   MCV 85.4 11/17/2023   PLT 162 11/17/2023   Lab Results  Component Value Date   CREATININE 1.20 (H) 11/17/2023   BUN 17 11/17/2023   NA 135 11/17/2023   K 4.2 11/17/2023   CL 107 11/17/2023   CO2 22 11/17/2023   Lab Results  Component Value Date   CHOL 87 11/12/2023   HDL 17 (L) 11/12/2023   LDLCALC 48 11/12/2023   LDLDIRECT 81.0 08/06/2020   TRIG 111 11/12/2023   CHOLHDL 5.1 11/12/2023    Lab Results  Component Value Date   HGBA1C 7.3 (H) 11/11/2023   Assessment & Plan    1.  History of CAD: -s/p NSTEMI with three-vessel CAD and recommendation of medical management due to patient not being a candidate for intervention  2.  Permanent AF: -Patient currently rate controlled with metoprolol 25 mg twice daily -Patient's last creatinine was***and hemoglobin was*** -Continue Eliquis 5 mg twice daily  3.  Essential hypertension: -Patient's blood pressure today was***  4.left leg ulcer/peripheral artery disease: -Patient underwent angiogram showing left CFA occlusion with recommendation for endarterectomy with patient's family electing to hold off on procedures.  5.  History of dementia: -Patient currently being evaluated by palliative care      Disposition: Follow-up with Armanda Magic, MD or  APP in *** months {Are you ordering a CV Procedure (e.g. stress test, cath, DCCV, TEE, etc)?   Press F2        :[id number]}   Signed, Napoleon Form, Leodis Rains, NP 11/30/2023, 6:46 PM [facility]

## 2023-12-01 ENCOUNTER — Ambulatory Visit: Admitting: Nurse Practitioner

## 2023-12-01 DIAGNOSIS — I2510 Atherosclerotic heart disease of native coronary artery without angina pectoris: Secondary | ICD-10-CM

## 2023-12-01 DIAGNOSIS — I10 Essential (primary) hypertension: Secondary | ICD-10-CM

## 2023-12-01 DIAGNOSIS — I4819 Other persistent atrial fibrillation: Secondary | ICD-10-CM

## 2023-12-01 DIAGNOSIS — F0390 Unspecified dementia without behavioral disturbance: Secondary | ICD-10-CM

## 2023-12-01 DIAGNOSIS — I739 Peripheral vascular disease, unspecified: Secondary | ICD-10-CM

## 2023-12-02 ENCOUNTER — Telehealth: Payer: Self-pay

## 2023-12-02 DIAGNOSIS — N390 Urinary tract infection, site not specified: Secondary | ICD-10-CM | POA: Diagnosis not present

## 2023-12-02 NOTE — Telephone Encounter (Signed)
 Medication Refill: -received request for pain medication refill.   -sent message to Dr. Lenell Antu and called daughter [name] to let her know the status.

## 2023-12-05 ENCOUNTER — Other Ambulatory Visit: Payer: Self-pay

## 2023-12-05 DIAGNOSIS — I87312 Chronic venous hypertension (idiopathic) with ulcer of left lower extremity: Secondary | ICD-10-CM | POA: Diagnosis not present

## 2023-12-05 DIAGNOSIS — L97822 Non-pressure chronic ulcer of other part of left lower leg with fat layer exposed: Secondary | ICD-10-CM | POA: Diagnosis not present

## 2023-12-05 DIAGNOSIS — L89622 Pressure ulcer of left heel, stage 2: Secondary | ICD-10-CM | POA: Diagnosis not present

## 2023-12-07 ENCOUNTER — Other Ambulatory Visit: Payer: Self-pay

## 2023-12-07 ENCOUNTER — Other Ambulatory Visit: Payer: Self-pay | Admitting: Vascular Surgery

## 2023-12-07 MED ORDER — OXYCODONE-ACETAMINOPHEN 5-325 MG PO TABS: 1.0000 | ORAL_TABLET | ORAL | 0 refills | Status: DC | PRN

## 2023-12-07 NOTE — Progress Notes (Signed)
 Percocet refilled.  Rande Brunt. Lenell Antu, MD [facility] Phone Number: [phone] 12/07/2023 5:19 PM

## 2023-12-12 ENCOUNTER — Encounter (HOSPITAL_BASED_OUTPATIENT_CLINIC_OR_DEPARTMENT_OTHER): Admitting: General Surgery

## 2023-12-12 DIAGNOSIS — L97822 Non-pressure chronic ulcer of other part of left lower leg with fat layer exposed: Secondary | ICD-10-CM | POA: Diagnosis not present

## 2023-12-12 DIAGNOSIS — L97422 Non-pressure chronic ulcer of left heel and midfoot with fat layer exposed: Secondary | ICD-10-CM | POA: Diagnosis not present

## 2023-12-12 DIAGNOSIS — E11622 Type 2 diabetes mellitus with other skin ulcer: Secondary | ICD-10-CM | POA: Diagnosis not present

## 2023-12-12 DIAGNOSIS — L97515 Non-pressure chronic ulcer of other part of right foot with muscle involvement without evidence of necrosis: Secondary | ICD-10-CM | POA: Diagnosis not present

## 2023-12-12 DIAGNOSIS — Z794 Long term (current) use of insulin: Secondary | ICD-10-CM | POA: Diagnosis not present

## 2023-12-12 DIAGNOSIS — E11621 Type 2 diabetes mellitus with foot ulcer: Secondary | ICD-10-CM | POA: Diagnosis not present

## 2023-12-19 DIAGNOSIS — R296 Repeated falls: Secondary | ICD-10-CM | POA: Diagnosis not present

## 2023-12-19 DIAGNOSIS — F0390 Unspecified dementia without behavioral disturbance: Secondary | ICD-10-CM | POA: Diagnosis not present

## 2023-12-19 DIAGNOSIS — G894 Chronic pain syndrome: Secondary | ICD-10-CM | POA: Diagnosis not present

## 2023-12-19 DIAGNOSIS — I509 Heart failure, unspecified: Secondary | ICD-10-CM | POA: Diagnosis not present

## 2023-12-20 ENCOUNTER — Encounter (HOSPITAL_BASED_OUTPATIENT_CLINIC_OR_DEPARTMENT_OTHER): Admitting: General Surgery

## 2023-12-20 DIAGNOSIS — L97822 Non-pressure chronic ulcer of other part of left lower leg with fat layer exposed: Secondary | ICD-10-CM | POA: Diagnosis not present

## 2023-12-20 DIAGNOSIS — Z794 Long term (current) use of insulin: Secondary | ICD-10-CM | POA: Diagnosis not present

## 2023-12-20 DIAGNOSIS — L97515 Non-pressure chronic ulcer of other part of right foot with muscle involvement without evidence of necrosis: Secondary | ICD-10-CM | POA: Diagnosis not present

## 2023-12-20 DIAGNOSIS — E11622 Type 2 diabetes mellitus with other skin ulcer: Secondary | ICD-10-CM | POA: Diagnosis not present

## 2023-12-20 DIAGNOSIS — E11621 Type 2 diabetes mellitus with foot ulcer: Secondary | ICD-10-CM | POA: Diagnosis not present

## 2023-12-21 DIAGNOSIS — L97509 Non-pressure chronic ulcer of other part of unspecified foot with unspecified severity: Secondary | ICD-10-CM | POA: Diagnosis not present

## 2023-12-21 DIAGNOSIS — F331 Major depressive disorder, recurrent, moderate: Secondary | ICD-10-CM | POA: Diagnosis not present

## 2023-12-21 DIAGNOSIS — E1121 Type 2 diabetes mellitus with diabetic nephropathy: Secondary | ICD-10-CM | POA: Diagnosis not present

## 2023-12-21 DIAGNOSIS — I739 Peripheral vascular disease, unspecified: Secondary | ICD-10-CM | POA: Diagnosis not present

## 2023-12-21 DIAGNOSIS — I4891 Unspecified atrial fibrillation: Secondary | ICD-10-CM | POA: Diagnosis not present

## 2023-12-21 DIAGNOSIS — I7025 Atherosclerosis of native arteries of other extremities with ulceration: Secondary | ICD-10-CM | POA: Diagnosis not present

## 2023-12-21 DIAGNOSIS — L89602 Pressure ulcer of unspecified heel, stage 2: Secondary | ICD-10-CM | POA: Diagnosis not present

## 2023-12-21 DIAGNOSIS — N1832 Chronic kidney disease, stage 3b: Secondary | ICD-10-CM | POA: Diagnosis not present

## 2023-12-21 DIAGNOSIS — I350 Nonrheumatic aortic (valve) stenosis: Secondary | ICD-10-CM | POA: Diagnosis not present

## 2023-12-21 DIAGNOSIS — D649 Anemia, unspecified: Secondary | ICD-10-CM | POA: Diagnosis not present

## 2023-12-21 DIAGNOSIS — F0390 Unspecified dementia without behavioral disturbance: Secondary | ICD-10-CM | POA: Diagnosis not present

## 2023-12-21 DIAGNOSIS — N3000 Acute cystitis without hematuria: Secondary | ICD-10-CM | POA: Diagnosis not present

## 2023-12-27 ENCOUNTER — Encounter (HOSPITAL_BASED_OUTPATIENT_CLINIC_OR_DEPARTMENT_OTHER): Admitting: General Surgery

## 2023-12-27 DIAGNOSIS — L97515 Non-pressure chronic ulcer of other part of right foot with muscle involvement without evidence of necrosis: Secondary | ICD-10-CM | POA: Diagnosis not present

## 2023-12-27 DIAGNOSIS — L97822 Non-pressure chronic ulcer of other part of left lower leg with fat layer exposed: Secondary | ICD-10-CM | POA: Diagnosis not present

## 2023-12-27 DIAGNOSIS — Z794 Long term (current) use of insulin: Secondary | ICD-10-CM | POA: Diagnosis not present

## 2023-12-27 DIAGNOSIS — E11621 Type 2 diabetes mellitus with foot ulcer: Secondary | ICD-10-CM | POA: Diagnosis not present

## 2023-12-27 DIAGNOSIS — L89623 Pressure ulcer of left heel, stage 3: Secondary | ICD-10-CM | POA: Diagnosis not present

## 2024-01-01 ENCOUNTER — Other Ambulatory Visit: Payer: Self-pay | Admitting: Cardiology

## 2024-01-01 DIAGNOSIS — I4819 Other persistent atrial fibrillation: Secondary | ICD-10-CM

## 2024-01-02 NOTE — Telephone Encounter (Signed)
 Prescription refill request for Eliquis  received. Indication:afib Last office visit:11/24 Scr:1.20  3/25 Age: 83 Weight:83.4  kg  Prescription refilled

## 2024-01-03 ENCOUNTER — Encounter (HOSPITAL_BASED_OUTPATIENT_CLINIC_OR_DEPARTMENT_OTHER): Attending: General Surgery | Admitting: General Surgery

## 2024-01-03 DIAGNOSIS — L97822 Non-pressure chronic ulcer of other part of left lower leg with fat layer exposed: Secondary | ICD-10-CM | POA: Diagnosis not present

## 2024-01-03 DIAGNOSIS — L97829 Non-pressure chronic ulcer of other part of left lower leg with unspecified severity: Secondary | ICD-10-CM | POA: Diagnosis not present

## 2024-01-03 DIAGNOSIS — I739 Peripheral vascular disease, unspecified: Secondary | ICD-10-CM | POA: Diagnosis not present

## 2024-01-03 DIAGNOSIS — L89623 Pressure ulcer of left heel, stage 3: Secondary | ICD-10-CM | POA: Insufficient documentation

## 2024-01-03 DIAGNOSIS — E11621 Type 2 diabetes mellitus with foot ulcer: Secondary | ICD-10-CM | POA: Diagnosis not present

## 2024-01-03 DIAGNOSIS — I509 Heart failure, unspecified: Secondary | ICD-10-CM | POA: Insufficient documentation

## 2024-01-03 DIAGNOSIS — L97515 Non-pressure chronic ulcer of other part of right foot with muscle involvement without evidence of necrosis: Secondary | ICD-10-CM | POA: Diagnosis not present

## 2024-01-03 DIAGNOSIS — L97516 Non-pressure chronic ulcer of other part of right foot with bone involvement without evidence of necrosis: Secondary | ICD-10-CM | POA: Diagnosis not present

## 2024-01-03 DIAGNOSIS — N1832 Chronic kidney disease, stage 3b: Secondary | ICD-10-CM | POA: Insufficient documentation

## 2024-01-03 DIAGNOSIS — E11622 Type 2 diabetes mellitus with other skin ulcer: Secondary | ICD-10-CM | POA: Insufficient documentation

## 2024-01-05 DIAGNOSIS — I872 Venous insufficiency (chronic) (peripheral): Secondary | ICD-10-CM | POA: Diagnosis not present

## 2024-01-05 DIAGNOSIS — E11621 Type 2 diabetes mellitus with foot ulcer: Secondary | ICD-10-CM | POA: Diagnosis not present

## 2024-01-05 DIAGNOSIS — L89622 Pressure ulcer of left heel, stage 2: Secondary | ICD-10-CM | POA: Diagnosis not present

## 2024-01-07 DIAGNOSIS — E11621 Type 2 diabetes mellitus with foot ulcer: Secondary | ICD-10-CM | POA: Diagnosis not present

## 2024-01-07 DIAGNOSIS — M79671 Pain in right foot: Secondary | ICD-10-CM | POA: Diagnosis not present

## 2024-01-07 DIAGNOSIS — M79672 Pain in left foot: Secondary | ICD-10-CM | POA: Diagnosis not present

## 2024-01-07 DIAGNOSIS — N390 Urinary tract infection, site not specified: Secondary | ICD-10-CM | POA: Diagnosis not present

## 2024-01-07 DIAGNOSIS — N183 Chronic kidney disease, stage 3 unspecified: Secondary | ICD-10-CM | POA: Diagnosis not present

## 2024-01-09 ENCOUNTER — Encounter (HOSPITAL_BASED_OUTPATIENT_CLINIC_OR_DEPARTMENT_OTHER): Admitting: Internal Medicine

## 2024-01-09 DIAGNOSIS — E11622 Type 2 diabetes mellitus with other skin ulcer: Secondary | ICD-10-CM | POA: Diagnosis not present

## 2024-01-09 DIAGNOSIS — L97516 Non-pressure chronic ulcer of other part of right foot with bone involvement without evidence of necrosis: Secondary | ICD-10-CM | POA: Diagnosis not present

## 2024-01-09 DIAGNOSIS — L97822 Non-pressure chronic ulcer of other part of left lower leg with fat layer exposed: Secondary | ICD-10-CM | POA: Diagnosis not present

## 2024-01-09 DIAGNOSIS — E11621 Type 2 diabetes mellitus with foot ulcer: Secondary | ICD-10-CM | POA: Diagnosis not present

## 2024-01-16 DIAGNOSIS — Z515 Encounter for palliative care: Secondary | ICD-10-CM | POA: Diagnosis not present

## 2024-01-16 DIAGNOSIS — J810 Acute pulmonary edema: Secondary | ICD-10-CM | POA: Diagnosis not present

## 2024-01-16 DIAGNOSIS — Z7901 Long term (current) use of anticoagulants: Secondary | ICD-10-CM | POA: Diagnosis not present

## 2024-01-16 DIAGNOSIS — E119 Type 2 diabetes mellitus without complications: Secondary | ICD-10-CM | POA: Diagnosis not present

## 2024-01-16 DIAGNOSIS — I2510 Atherosclerotic heart disease of native coronary artery without angina pectoris: Secondary | ICD-10-CM | POA: Diagnosis not present

## 2024-01-16 DIAGNOSIS — F0390 Unspecified dementia without behavioral disturbance: Secondary | ICD-10-CM | POA: Diagnosis not present

## 2024-01-16 DIAGNOSIS — J188 Other pneumonia, unspecified organism: Secondary | ICD-10-CM | POA: Diagnosis not present

## 2024-01-16 DIAGNOSIS — R296 Repeated falls: Secondary | ICD-10-CM | POA: Diagnosis not present

## 2024-01-16 DIAGNOSIS — E1122 Type 2 diabetes mellitus with diabetic chronic kidney disease: Secondary | ICD-10-CM | POA: Diagnosis not present

## 2024-01-16 DIAGNOSIS — I130 Hypertensive heart and chronic kidney disease with heart failure and stage 1 through stage 4 chronic kidney disease, or unspecified chronic kidney disease: Secondary | ICD-10-CM | POA: Diagnosis not present

## 2024-01-16 DIAGNOSIS — R399 Unspecified symptoms and signs involving the genitourinary system: Secondary | ICD-10-CM | POA: Diagnosis not present

## 2024-01-16 DIAGNOSIS — J189 Pneumonia, unspecified organism: Secondary | ICD-10-CM | POA: Diagnosis not present

## 2024-01-16 DIAGNOSIS — J9601 Acute respiratory failure with hypoxia: Secondary | ICD-10-CM | POA: Diagnosis not present

## 2024-01-16 DIAGNOSIS — S81809A Unspecified open wound, unspecified lower leg, initial encounter: Secondary | ICD-10-CM | POA: Diagnosis not present

## 2024-01-16 DIAGNOSIS — E039 Hypothyroidism, unspecified: Secondary | ICD-10-CM | POA: Diagnosis not present

## 2024-01-16 DIAGNOSIS — I739 Peripheral vascular disease, unspecified: Secondary | ICD-10-CM | POA: Diagnosis not present

## 2024-01-16 DIAGNOSIS — I350 Nonrheumatic aortic (valve) stenosis: Secondary | ICD-10-CM | POA: Diagnosis not present

## 2024-01-16 DIAGNOSIS — I10 Essential (primary) hypertension: Secondary | ICD-10-CM | POA: Diagnosis not present

## 2024-01-16 DIAGNOSIS — R0902 Hypoxemia: Secondary | ICD-10-CM | POA: Diagnosis not present

## 2024-01-16 DIAGNOSIS — A419 Sepsis, unspecified organism: Secondary | ICD-10-CM | POA: Diagnosis not present

## 2024-01-16 DIAGNOSIS — I25118 Atherosclerotic heart disease of native coronary artery with other forms of angina pectoris: Secondary | ICD-10-CM | POA: Diagnosis not present

## 2024-01-16 DIAGNOSIS — I4891 Unspecified atrial fibrillation: Secondary | ICD-10-CM | POA: Diagnosis not present

## 2024-01-16 DIAGNOSIS — N390 Urinary tract infection, site not specified: Secondary | ICD-10-CM | POA: Diagnosis not present

## 2024-01-16 DIAGNOSIS — Z7189 Other specified counseling: Secondary | ICD-10-CM | POA: Diagnosis not present

## 2024-01-16 DIAGNOSIS — I509 Heart failure, unspecified: Secondary | ICD-10-CM | POA: Diagnosis not present

## 2024-01-16 DIAGNOSIS — E669 Obesity, unspecified: Secondary | ICD-10-CM | POA: Diagnosis not present

## 2024-01-16 DIAGNOSIS — E872 Acidosis, unspecified: Secondary | ICD-10-CM | POA: Diagnosis not present

## 2024-01-16 DIAGNOSIS — F03B0 Unspecified dementia, moderate, without behavioral disturbance, psychotic disturbance, mood disturbance, and anxiety: Secondary | ICD-10-CM | POA: Diagnosis not present

## 2024-01-16 DIAGNOSIS — E1165 Type 2 diabetes mellitus with hyperglycemia: Secondary | ICD-10-CM | POA: Diagnosis not present

## 2024-01-16 DIAGNOSIS — R339 Retention of urine, unspecified: Secondary | ICD-10-CM | POA: Diagnosis not present

## 2024-01-16 DIAGNOSIS — G894 Chronic pain syndrome: Secondary | ICD-10-CM | POA: Diagnosis not present

## 2024-01-16 DIAGNOSIS — E1151 Type 2 diabetes mellitus with diabetic peripheral angiopathy without gangrene: Secondary | ICD-10-CM | POA: Diagnosis not present

## 2024-01-16 DIAGNOSIS — I5032 Chronic diastolic (congestive) heart failure: Secondary | ICD-10-CM | POA: Diagnosis not present

## 2024-01-16 DIAGNOSIS — D72829 Elevated white blood cell count, unspecified: Secondary | ICD-10-CM | POA: Diagnosis not present

## 2024-01-16 DIAGNOSIS — R0602 Shortness of breath: Secondary | ICD-10-CM | POA: Diagnosis not present

## 2024-01-16 DIAGNOSIS — R52 Pain, unspecified: Secondary | ICD-10-CM | POA: Diagnosis not present

## 2024-01-16 DIAGNOSIS — N1832 Chronic kidney disease, stage 3b: Secondary | ICD-10-CM | POA: Diagnosis not present

## 2024-01-16 DIAGNOSIS — N179 Acute kidney failure, unspecified: Secondary | ICD-10-CM | POA: Diagnosis not present

## 2024-01-16 DIAGNOSIS — I5033 Acute on chronic diastolic (congestive) heart failure: Secondary | ICD-10-CM | POA: Diagnosis not present

## 2024-01-16 DIAGNOSIS — J9621 Acute and chronic respiratory failure with hypoxia: Secondary | ICD-10-CM | POA: Diagnosis not present

## 2024-01-16 DIAGNOSIS — I482 Chronic atrial fibrillation, unspecified: Secondary | ICD-10-CM | POA: Diagnosis not present

## 2024-01-16 DIAGNOSIS — J811 Chronic pulmonary edema: Secondary | ICD-10-CM | POA: Diagnosis not present

## 2024-01-16 DIAGNOSIS — I214 Non-ST elevation (NSTEMI) myocardial infarction: Secondary | ICD-10-CM | POA: Diagnosis not present

## 2024-01-17 ENCOUNTER — Ambulatory Visit (HOSPITAL_BASED_OUTPATIENT_CLINIC_OR_DEPARTMENT_OTHER): Admitting: General Surgery

## 2024-01-17 DIAGNOSIS — J188 Other pneumonia, unspecified organism: Secondary | ICD-10-CM | POA: Diagnosis not present

## 2024-01-17 DIAGNOSIS — R399 Unspecified symptoms and signs involving the genitourinary system: Secondary | ICD-10-CM | POA: Diagnosis not present

## 2024-01-17 DIAGNOSIS — R339 Retention of urine, unspecified: Secondary | ICD-10-CM | POA: Diagnosis not present

## 2024-01-17 DIAGNOSIS — J9621 Acute and chronic respiratory failure with hypoxia: Secondary | ICD-10-CM | POA: Diagnosis not present

## 2024-01-17 DIAGNOSIS — S81809A Unspecified open wound, unspecified lower leg, initial encounter: Secondary | ICD-10-CM | POA: Diagnosis not present

## 2024-01-17 DIAGNOSIS — J189 Pneumonia, unspecified organism: Secondary | ICD-10-CM | POA: Diagnosis not present

## 2024-01-18 DIAGNOSIS — G894 Chronic pain syndrome: Secondary | ICD-10-CM | POA: Diagnosis not present

## 2024-01-18 DIAGNOSIS — I509 Heart failure, unspecified: Secondary | ICD-10-CM | POA: Diagnosis not present

## 2024-01-18 DIAGNOSIS — F0390 Unspecified dementia without behavioral disturbance: Secondary | ICD-10-CM | POA: Diagnosis not present

## 2024-01-18 DIAGNOSIS — R296 Repeated falls: Secondary | ICD-10-CM | POA: Diagnosis not present

## 2024-01-24 ENCOUNTER — Ambulatory Visit (HOSPITAL_BASED_OUTPATIENT_CLINIC_OR_DEPARTMENT_OTHER): Admitting: General Surgery

## 2024-01-25 ENCOUNTER — Ambulatory Visit: Payer: HMO | Admitting: Podiatry

## 2024-01-27 ENCOUNTER — Encounter: Payer: Self-pay | Admitting: Cardiology

## 2024-01-27 ENCOUNTER — Ambulatory Visit: Attending: Cardiology | Admitting: Cardiology

## 2024-01-27 VITALS — BP 116/66 | HR 87 | Ht 60.0 in | Wt 173.8 lb

## 2024-01-27 DIAGNOSIS — I4819 Other persistent atrial fibrillation: Secondary | ICD-10-CM | POA: Diagnosis not present

## 2024-01-27 DIAGNOSIS — I6523 Occlusion and stenosis of bilateral carotid arteries: Secondary | ICD-10-CM

## 2024-01-27 DIAGNOSIS — E7800 Pure hypercholesterolemia, unspecified: Secondary | ICD-10-CM

## 2024-01-27 DIAGNOSIS — G4733 Obstructive sleep apnea (adult) (pediatric): Secondary | ICD-10-CM

## 2024-01-27 DIAGNOSIS — I359 Nonrheumatic aortic valve disorder, unspecified: Secondary | ICD-10-CM | POA: Diagnosis not present

## 2024-01-27 DIAGNOSIS — I10 Essential (primary) hypertension: Secondary | ICD-10-CM | POA: Diagnosis not present

## 2024-01-27 MED ORDER — METOPROLOL SUCCINATE ER 25 MG PO TB24: 25.0000 mg | ORAL_TABLET | Freq: Two times a day (BID) | ORAL | 3 refills | Status: DC

## 2024-01-27 NOTE — Progress Notes (Signed)
 Date:  01/27/2024   ID:  Tina Hester, DOB [birth year], MRN [id number] The patient was identified using 2 identifiers.  PCP:  Olin Bertin, MD   [facility] Providers Cardiologist:  Gaylyn Keas, MD Electrophysiologist:  Will Cortland Ding, MD     Evaluation Performed:  Follow-Up Visit  Chief Complaint:  PAF, HTN, carotid artery stenosis, aortic stenosis, OSA  History of Present Illness:    Tina Hester is a 83 y.o. female with a hx of PAF, HTN, bilateral carotid artery stenosis, aortic stenosis, morbid obesity and OSA on CPAP.      She is here today for followup. she has been in the hospital several times since I saw her last.  She was hospitalized 11/10/2023 due to acute respiratory failure with hypoxemia.  She apparently had developed shortness of breath and chest pain while sleeping and called EMS.  She was in A-fib at 111 bpm and troponin was noted to be 466 and 1633 with BNP 546.  Chest x-ray showed atelectasis.  Chest CT showed no PE but extensive multivessel CAD and concern for pulmonary edema.  She was started on IV Lasix .  Cardiac cath 11/10/2023 showed 70% ostial RCA and left main, 70% proximal LAD, 90% proximal left circumflex, 40% mid left circumflex, 60% distal left circumflex, 40% long stenosis of the proximal and mid RCA.  She was diagnosed with severe left main and three-vessel CAD but management is recommended.  Felt not to be a candidate for bypass given her significant dementia and advanced age.  Plans were for aggressive rate control of her A-fib.  Hospitalization was complicated by acute on chronic stage IIIb CKD in the setting of UTI and she was treated with antibiotics.  During that same hospitalization she was found to have an ischemic left leg.  She complained of nonhealing left leg ulcer and ABIs raise concern of left lower extremity ischemia.  Left lower extremity angiogram 11/11/2023 showed an occluded CFA on the left and recommended  endarterectomy but felt high risk due to her comorbidities.  After discussion with the patient and family vascular surgery hold off on any further procedures at that time.  Prognosis was felt to be poor and palliative care consult was recommended outpatient.  She then was seen in the emergency room on 01/16/2024  at [facility] in [place]  after she presented with chest pain and shortness of breath.  She was found to be in A-fib with RVR which is felt to be the etiology for her symptoms.  High-sensitivity troponin was minimally elevated at 34 and serum creatinine was 1.15 potassium 4.5.  EKG showed A-fib with RVR left bundle branch block.  She was given IV Cardizem with control of heart rate.  She is now back here today for follow-up.  Since her last visit she has not had any further chest pain or lower extremity edema.  She has had some intermittent shortness of breath when exerting herself but improved from when she was in the hospital.  Her daughter dose is Lasix  based on symptoms and weight gain and has not had to give her any recently.  She currently denies any PND, orthopnea, LE edema, dizziness, palpitations or syncope. She is compliant with her meds and is tolerating meds with no SE.    She has not been using her CPAP device.  At last OV she had lost weight and felt she did not have OSA .  Her daughter says that she has significant dementia  and has problems tolerating keeping a mask on so we decided not to pursue CPAP treatment or retesting.   Past Medical History:  Diagnosis Date   Anemia    s/p Heme work -up normal EGD and colonscopy in 2012 per pt,neg SPEP   Anxiety    Aortic stenosis    Mild by echo 12/2021 with mean aortic valve gradient 17 mmHg   Bilateral carotid artery stenosis 06/08/2015   1-39% bilateral   Bradycardia    CKD (chronic kidney disease), stage III (HCC)    Diabetes mellitus without complication (HCC)    type 2   GERD (gastroesophageal reflux disease)     Gout    HSV-1 (herpes simplex virus 1) infection    Acyclovir  prn   Hyperkalemia    Hyperlipidemia    Hypertension    Joint pain    osteoarthritis by Xray- possible erosion of R 4th MCP,elevated uric  acid ,ANA +    MCI (mild cognitive impairment)    Morbid obesity (HCC)    OSA on CPAP    Osteopenia    PAF (paroxysmal atrial fibrillation) (HCC)    Pain management    Neurosurg: Dr Gerri Kras   Pulmonary HTN (HCC)    Moderate by echo  2016. PASP   TIA (transient ischemic attack)    remote history of TIA's early 2000   Past Surgical History:  Procedure Laterality Date   ABDOMINAL AORTOGRAM W/LOWER EXTREMITY N/A 11/11/2023   Procedure: ABDOMINAL AORTOGRAM W/LOWER EXTREMITY;  Surgeon: Carlene Che, MD;  Location: [facility] INVASIVE CV LAB;  Service: Cardiovascular;  Laterality: N/A;   ABDOMINAL HYSTERECTOMY     APPENDECTOMY     BACK SURGERY     cervical fusion   BLADDER SURGERY     CESAREAN SECTION     CHOLECYSTECTOMY     LEFT HEART CATH AND CORONARY ANGIOGRAPHY N/A 11/10/2023   Procedure: LEFT HEART CATH AND CORONARY ANGIOGRAPHY;  Surgeon: Odie Benne, MD;  Location: [facility] INVASIVE CV LAB;  Service: Cardiovascular;  Laterality: N/A;   LOWER EXTREMITY ANGIOGRAPHY N/A 02/25/2023   Procedure: Lower Extremity Angiography;  Surgeon: Carlene Che, MD;  Location: [facility] INVASIVE CV LAB;  Service: Cardiovascular;  Laterality: N/A;   LOWER EXTREMITY ANGIOGRAPHY N/A 11/11/2023   Procedure: Lower Extremity Angiography;  Surgeon: Carlene Che, MD;  Location: [facility] INVASIVE CV LAB;  Service: Cardiovascular;  Laterality: N/A;   LOWER EXTREMITY INTERVENTION N/A 11/11/2023   Procedure: LOWER EXTREMITY INTERVENTION;  Surgeon: Carlene Che, MD;  Location: [facility] INVASIVE CV LAB;  Service: Cardiovascular;  Laterality: N/A;   PERIPHERAL VASCULAR INTERVENTION  02/25/2023   Procedure: PERIPHERAL VASCULAR INTERVENTION;  Surgeon: Carlene Che, MD;  Location: [facility] INVASIVE CV LAB;  Service:  Cardiovascular;;     Current Meds  Medication Sig   acetaminophen  (TYLENOL ) 325 MG tablet Take 650 mg by mouth every 4 (four) hours as needed for mild pain (pain score 1-3) or moderate pain (pain score 4-6).   amoxicillin-clavulanate (AUGMENTIN) 250-62.5 MG/5ML suspension Take 10 mLs by mouth 2 (two) times daily.   aspirin  EC 81 MG tablet Take 1 tablet (81 mg total) by mouth daily. Swallow whole.   CALMOSEPTINE 0.44-20.6 % OINT Apply 1 Application topically daily as needed (Skin irritation).   clopidogrel  (PLAVIX ) 75 MG tablet Take 75 mg by mouth daily.   donepezil  (ARICEPT ) 5 MG tablet Take 5 mg by mouth daily.   ELIQUIS  5 MG TABS tablet TAKE 1 TABLET(5 MG) BY  MOUTH TWICE DAILY   empagliflozin  (JARDIANCE ) 10 MG TABS tablet Take 10 mg by mouth daily.   ezetimibe  (ZETIA ) 10 MG tablet Take 10 mg by mouth every evening.   furosemide  (LASIX ) 20 MG tablet Take 20 mg by mouth as needed for fluid or edema.   gabapentin  (NEURONTIN ) 300 MG capsule Take 1 capsule (300 mg total) by mouth at bedtime.   HYDROcodone -acetaminophen  (NORCO/VICODIN) 5-325 MG tablet Take 1 tablet by mouth every 6 (six) hours as needed for moderate pain (pain score 4-6).   insulin  lispro (HUMALOG ) 100 UNIT/ML injection Inject 1-7 Units into the skin 3 (three) times daily before meals. Sliding scale 141-180=1 unit,181-220= 2 units,221-260= 3 units,261-300=4 units, 301-340=5 units, 341-380=6 units, 381-400 = 7 units,> 400 call MD   Insulin  NPH, Human,, Isophane, (NOVOLIN  N FLEXPEN) 100 UNIT/ML Kiwkpen Inject 15 units at morning and 18 units at night (Patient taking differently: Inject 14-18 Units into the skin See admin instructions. Inject 14 units at morning and 18 units at night)   levothyroxine  (SYNTHROID ) 150 MCG tablet Take 150 mcg by mouth daily before breakfast.   metoprolol  succinate (TOPROL -XL) 25 MG 24 hr tablet Take 25 mg by mouth 2 (two) times daily.   omeprazole (PRILOSEC) 20 MG capsule Take 20 mg by mouth daily as  needed (acid reflux).   oxyCODONE  (OXY IR/ROXICODONE ) 5 MG immediate release tablet Take 5 mg by mouth every 6 (six) hours as needed.   oxyCODONE -acetaminophen  (PERCOCET/ROXICET) 5-325 MG tablet Take 1 tablet by mouth every 4 (four) hours as needed for severe pain (pain score 7-10).   OZEMPIC , 0.25 OR 0.5 MG/DOSE, 2 MG/3ML SOPN Inject 0.25 mg into the skin once a week. Sundays   polyethylene glycol (MIRALAX  / GLYCOLAX ) 17 g packet Take 17 g by mouth daily as needed.   rosuvastatin  (CRESTOR ) 20 MG tablet Take 1 tablet (20 mg total) by mouth daily.   SANTYL 250 UNIT/GM ointment Apply 1 Application topically as needed.   sertraline  (ZOLOFT ) 50 MG tablet Take 50 mg by mouth daily.     Allergies:   Pneumococcal vaccine, Sulfa antibiotics, and Pregabalin   Social History   Tobacco Use   Smoking status: Never   Smokeless tobacco: Never  Vaping Use   Vaping status: Never Used  Substance Use Topics   Alcohol use: No   Drug use: No     Family Hx: The patient's family history includes Arrhythmia in her brother.  ROS:   Please see the history of present illness.     All other systems reviewed and are negative.   Prior CV studies:   The following studies were reviewed today:    Labs/Other Tests and Data Reviewed:    EKG:  No ECG reviewed.  Recent Labs: 08/05/2023: ALT 14 11/10/2023: B Natriuretic Peptide 546.3; TSH 2.139 11/17/2023: BUN 17; Creatinine, Ser 1.20; Hemoglobin 9.7; Magnesium 1.9; Platelets 162; Potassium 4.2; Sodium 135   Recent Lipid Panel Lab Results  Component Value Date/Time   CHOL 87 11/12/2023 03:51 AM   CHOL 147 12/18/2021 09:49 AM   TRIG 111 11/12/2023 03:51 AM   HDL 17 (L) 11/12/2023 03:51 AM   HDL 27 (L) 12/18/2021 09:49 AM   CHOLHDL 5.1 11/12/2023 03:51 AM   LDLCALC 48 11/12/2023 03:51 AM   LDLCALC 86 12/18/2021 09:49 AM   LDLDIRECT 81.0 08/06/2020 03:24 PM    Wt Readings from Last 3 Encounters:  01/27/24 173 lb 12.8 oz (78.8 kg)  11/17/23 183 lb  13.8 oz (83.4  kg)  07/04/23 189 lb (85.7 kg)        Objective:    Vital Signs:  BP 116/66   Pulse 87   Ht 5' (1.524 m)   Wt 173 lb 12.8 oz (78.8 kg)   SpO2 90%   BMI 33.94 kg/m   GEN: Well nourished, well developed in no acute distress HEENT: Normal NECK: No JVD; No carotid bruits LYMPHATICS: No lymphadenopathy CARDIAC:irregularly irregular, no rubs, gallops.  2/6 SM at RUSB to LLSB RESPIRATORY:  Clear to auscultation without rales, wheezing or rhonchi  ABDOMEN: Soft, non-tender, non-distended MUSCULOSKELETAL:  No edema; No deformity  SKIN: Warm and dry NEUROLOGIC:  Alert and oriented x 3 PSYCHIATRIC:  Normal affect   ASSESSMENT & PLAN:    1.  Persistent atrial fibrillation/atrial flutter -she failed Multaq .   -She was evaluated by Dr. Kin Penner and was doing better and wanted to hold off on afib ablation -She sounds to be in atrial fibrillation with controlled ventricular response today -Hospitalized several times recently with episodes of shortness of breath and palpitations and found to be in A-fib with RVR which seem to have triggered episodes of shortness of breath and chest pain -Cardiac cath showed severe three-vessel disease with left main disease but felt not a candidate for PCI or surgery and felt her main problem was going in and out of A-fib with RVR causing ischemia - Currently denies any palpitations - She will continue Toprol  XL 25 mg twice daily as well as Eliquis  5 mg twice daily with as needed refills -I have personally reviewed and interpreted outside labs performed by patient's PCP which showed hemoglobin 10 on 11/23/2023, creatinine 1.2 on 11/17/2023 and potassium 4.8 on 11/23/2023 -I am going to refer her back to Dr. Lawana Pray to see if we can get some help in trying to control her heart rate   2.  Hypertension  - BP control on exam today continue Toprol  X - 25 mg p.o. twice daily   3.  Bilateral carotid artery stenosis  -her last carotid Dopplers in 2018  showed mild carotid stenosis 1 to 39% but repeat dopplers 01/2019 essentially normal -continue statin   4.  Aortic stenosis/Mild AR  -2D echo 2020 showed showed a mean aortic valve gradient -2D echo 01/05/2022 showed EF 55 to 60% with mild MR and mild AR with mild aortic stenosis and mean aortic valve gradient 17 mmHg - 2D echo 11/17/2023 showed EF 60 to 65% with no with mild AI and low-flow low gradient severe aortic stenosis with DVI 0.24, SVI 20, mean aortic valve gradient 18 and V-max 2.7 m/s -I discussed the findings of the echo and the severe aortic stenosis with the patient and her daughter today.  Given her significant comorbidities, advanced age and dementia I am not sure that she would be a candidate for TAVR.  She is also already been deemed not a candidate for PCI or CABG.  I told him I would discuss this with Dr. Abel Hoe he would done her cath.   5.  Hyperlipidemia  -LDL goal is less than 100 -I have personally reviewed and interpreted outside labs performed by patient's PCP which showed LDL 48, HDL 17 on 11/12/2023 and ALT 14 on 08/05/2023 -Continue Zetia  10 mg daily and Crestor  20 mg daily with as needed refills   6.  OSA -her daughter feels like she will not tolerate CPAP give her dementia and so we have decided not to pursue any further treatment.  7. ASCAD -Admitted in March 2025 with NSTEMI in the setting of chest pain and shortness of breath and A-fib with RVR -Cardiac cath 11/10/2023 showed 70% ostial RCA and left main, 70% proximal LAD, 90% proximal left circumflex, 40% mid left circumflex, 60% distal left circumflex, 40% long stenosis of the proximal and mid RCA.  She was diagnosed with severe left main and three-vessel CAD but management is recommended.   -Felt not to be a candidate for bypass given her significant dementia and advanced age.   -Plans were for aggressive rate control of her A-fib.   -She has not had any further episodes of chest pain. -Continue Plavix   75 mg daily, Toprol -XL 50 mg twice daily, Crestor  20 mg daily -Stop aspirin  due to DOAC        Medication Adjustments/Labs and Tests Ordered: Current medicines are reviewed at length with the patient today.  Concerns regarding medicines are outlined above.   Tests Ordered: No orders of the defined types were placed in this encounter.   Medication Changes: No orders of the defined types were placed in this encounter.   Follow Up:  In Person in 6 month(s)  Signed, Gaylyn Keas, MD  01/27/2024 11:52 AM    [facility]

## 2024-01-27 NOTE — Patient Instructions (Addendum)
 Medication Instructions:  Your physician recommends that you continue on your current medications as directed. Please refer to the Current Medication list given to you today.  *If you need a refill on your cardiac medications before your next appointment, please call your pharmacy*  Lab Work: None.  If you have labs (blood work) drawn today and your tests are completely normal, you will receive your results only by: MyChart Message (if you have MyChart) OR A paper copy in the mail If you have any lab test that is abnormal or we need to change your treatment, we will call you to review the results.  Testing/Procedures: None.  Follow-Up: At [facility], you and your health needs are our priority.  As part of our continuing mission to provide you with exceptional heart care, our providers are all part of one team.  This team includes your primary Cardiologist (physician) and Advanced Practice Providers or APPs (Physician Assistants and Nurse Practitioners) who all work together to provide you with the care you need, when you need it.  Your next appointment:   3 month(s)  Provider:   Gaylyn Keas, MD    We recommend signing up for the patient portal called "MyChart".  Sign up information is provided on this After Visit Summary.  MyChart is used to connect with patients for Virtual Visits (Telemedicine).  Patients are able to view lab/test results, encounter notes, upcoming appointments, etc.  Non-urgent messages can be sent to your provider as well.   To learn more about what you can do with MyChart, go to [email or web address].   Other Instructions Please make an appointment with Dr. Lawana Pray in our electrophysiology department to discuss your recent hospital visit for atrial fibrillation.

## 2024-01-31 ENCOUNTER — Telehealth: Payer: Self-pay

## 2024-01-31 DIAGNOSIS — I359 Nonrheumatic aortic valve disorder, unspecified: Secondary | ICD-10-CM

## 2024-01-31 NOTE — Telephone Encounter (Signed)
-----   Message from Antoinette Batman sent at 01/30/2024 11:44 AM EDT ----- Sounds good Traci. ----- Message ----- From: Jacqueline Matsu, MD Sent: 01/29/2024   7:49 PM EDT To: Odie Benne, MD; Cherylyn Cos, RN  Thanks Larinda Plover.  The only time you saw her was for her cath.  I suspect that she is not a candidate for TAVR but is symptomatic from her AS so I will get Ardelia Beau to set her up to see you and if you agree not a candidate then will get set up with outpt Hospice ----- Message ----- From: Odie Benne, MD Sent: 01/27/2024   4:36 PM EDT To: Jacqueline Matsu, MD  Hey Traci. I don't think I have ever seen her other than doing her cath. She has terrible three vessel CAD. I would not imagine that she is a candidate for TAVR either but I would be happy to see her to review her AS with her and her family if you would like. Larinda Plover ----- Message ----- From: Jacqueline Matsu, MD Sent: 01/27/2024   3:29 PM EDT To: Odie Benne, MD  Audery Blazing saw this lady today in the office you had catheter back in March for a non-STEMI with left main and severe three-vessel CAD felt not amenable to PCI or CABG due to her dementia and comorbidities as well as advanced age.  She does have low-flow low gradient severe AS and has been in the hospital again with chest pain and shortness of breath at an outside hospital as well as pulmonary edema likely partly because of A-fib with RVR uncontrolled again.  I am sending her back to see Will Camnitz.  I just wanted to make 100% certain that she is not a candidate for TAVR and would ask if you could review my notes and your notes from her So I can give a definitive answer to the family.  Apparently palliative care was recommended as an outpatient but this was never followed through with  Traci

## 2024-01-31 NOTE — Telephone Encounter (Signed)
-----   Message from Gaylyn Keas sent at 01/27/2024  3:27 PM EDT ----- Please have patient stop aspirin  since she is now on Plavix  and Eliquis

## 2024-01-31 NOTE — Telephone Encounter (Signed)
 Call to patient's dtr [name] [facility]) to advise Dr. Micael Adas would like to refer to Dr. Abel Hoe to eval for TAVR. [name] verbalizes understanding and agrees to plan, referral placed.

## 2024-01-31 NOTE — Telephone Encounter (Signed)
 Call to patient to advise Dr. Micael Adas recommends she stop taking aspirin  since she is on plavix  and eliquis . Spoke with dtr [name] ([facility]) who verbalized understanding. Med list updated.

## 2024-02-01 ENCOUNTER — Telehealth: Payer: Self-pay

## 2024-02-01 ENCOUNTER — Ambulatory Visit (INDEPENDENT_AMBULATORY_CARE_PROVIDER_SITE_OTHER): Admitting: Podiatry

## 2024-02-01 ENCOUNTER — Encounter: Payer: Self-pay | Admitting: Podiatry

## 2024-02-01 DIAGNOSIS — M79676 Pain in unspecified toe(s): Secondary | ICD-10-CM

## 2024-02-01 DIAGNOSIS — B351 Tinea unguium: Secondary | ICD-10-CM | POA: Diagnosis not present

## 2024-02-01 DIAGNOSIS — N1832 Chronic kidney disease, stage 3b: Secondary | ICD-10-CM

## 2024-02-01 NOTE — Progress Notes (Signed)
 This patient returns to my office for at risk foot care.  This patient requires this care by a professional since this patient will be at risk due to having PVD, CKD, diabetes.  She has open wound which is being treated at the wound center.  This patient is unable to cut nails herself since the patient cannot reach her nails.These nails are painful walking and wearing shoes.  This patient presents for at risk foot care today.  General Appearance  Alert, conversant and in no acute stress.  Vascular  Dorsalis pedis and posterior tibial  pulses are  not palpable  bilaterally.  Capillary return are diminished  bilaterally. Cold feet. bilaterally.  Neurologic  Senn-Weinstein monofilament wire test within normal limits  bilaterally. Muscle power within normal limits bilaterally.  Nails Thick disfigured discolored nails with subungual debris  from hallux to fifth toes bilaterally. No evidence of bacterial infection or drainage bilaterally.  Orthopedic  No limitations of motion  feet .  No crepitus or effusions noted.  No bony pathology or digital deformities noted. Open wound right hallux dorsally.  Skin  normotropic skin with no porokeratosis noted bilaterally.  No signs of infections or ulcers noted.     Onychomycosis  Pain in right toes  Pain in left toes  Consent was obtained for treatment procedures.   Mechanical debridement of nails 1-5  bilaterally performed with a nail nipper.  Filed with dremel without incident. Open wound bandaged with  neosporin/DSD   Return office visit   3 months.                  Told patient to return for periodic foot care and evaluation due to potential at risk complications.   Ruffin Cotton DPM

## 2024-02-01 NOTE — Telephone Encounter (Signed)
 Per Dr. Micael Adas, called to arrange TAVR consult. [name], her daughter and transportation, is in the car and requested to be called tomorrow to schedule.

## 2024-02-02 NOTE — Telephone Encounter (Signed)
 Scheduled the patient 03/08/2024 for TAVR consult with Dr. Abel Hoe. Tina Hester was grateful for call and agreed with plan.

## 2024-02-03 ENCOUNTER — Ambulatory Visit: Admitting: Nurse Practitioner

## 2024-02-03 DIAGNOSIS — I350 Nonrheumatic aortic (valve) stenosis: Secondary | ICD-10-CM | POA: Diagnosis not present

## 2024-02-03 DIAGNOSIS — E876 Hypokalemia: Secondary | ICD-10-CM | POA: Diagnosis not present

## 2024-02-03 DIAGNOSIS — J9601 Acute respiratory failure with hypoxia: Secondary | ICD-10-CM | POA: Diagnosis not present

## 2024-02-03 DIAGNOSIS — T148XXD Other injury of unspecified body region, subsequent encounter: Secondary | ICD-10-CM | POA: Diagnosis not present

## 2024-02-03 DIAGNOSIS — I5042 Chronic combined systolic (congestive) and diastolic (congestive) heart failure: Secondary | ICD-10-CM | POA: Diagnosis not present

## 2024-02-03 DIAGNOSIS — I739 Peripheral vascular disease, unspecified: Secondary | ICD-10-CM | POA: Diagnosis not present

## 2024-02-06 ENCOUNTER — Encounter (HOSPITAL_BASED_OUTPATIENT_CLINIC_OR_DEPARTMENT_OTHER): Attending: General Surgery | Admitting: General Surgery

## 2024-02-06 DIAGNOSIS — I509 Heart failure, unspecified: Secondary | ICD-10-CM | POA: Insufficient documentation

## 2024-02-06 DIAGNOSIS — N1832 Chronic kidney disease, stage 3b: Secondary | ICD-10-CM | POA: Insufficient documentation

## 2024-02-06 DIAGNOSIS — E1151 Type 2 diabetes mellitus with diabetic peripheral angiopathy without gangrene: Secondary | ICD-10-CM | POA: Insufficient documentation

## 2024-02-06 DIAGNOSIS — L97829 Non-pressure chronic ulcer of other part of left lower leg with unspecified severity: Secondary | ICD-10-CM | POA: Insufficient documentation

## 2024-02-06 DIAGNOSIS — E11622 Type 2 diabetes mellitus with other skin ulcer: Secondary | ICD-10-CM | POA: Diagnosis not present

## 2024-02-06 DIAGNOSIS — E11621 Type 2 diabetes mellitus with foot ulcer: Secondary | ICD-10-CM | POA: Diagnosis not present

## 2024-02-06 DIAGNOSIS — L89623 Pressure ulcer of left heel, stage 3: Secondary | ICD-10-CM | POA: Insufficient documentation

## 2024-02-06 DIAGNOSIS — E1122 Type 2 diabetes mellitus with diabetic chronic kidney disease: Secondary | ICD-10-CM | POA: Insufficient documentation

## 2024-02-06 DIAGNOSIS — L97822 Non-pressure chronic ulcer of other part of left lower leg with fat layer exposed: Secondary | ICD-10-CM | POA: Diagnosis not present

## 2024-02-06 DIAGNOSIS — L97516 Non-pressure chronic ulcer of other part of right foot with bone involvement without evidence of necrosis: Secondary | ICD-10-CM | POA: Diagnosis not present

## 2024-02-07 ENCOUNTER — Ambulatory Visit: Attending: Vascular Surgery | Admitting: Vascular Surgery

## 2024-02-07 ENCOUNTER — Encounter: Payer: Self-pay | Admitting: Vascular Surgery

## 2024-02-07 VITALS — BP 147/57 | HR 74 | Temp 97.9°F

## 2024-02-07 DIAGNOSIS — I70243 Atherosclerosis of native arteries of left leg with ulceration of ankle: Secondary | ICD-10-CM

## 2024-02-07 NOTE — Progress Notes (Unsigned)
 [facility] [place]  ASSESSMENT / PLAN: 83 y.o. female with atherosclerosis of native arteries of left lower extremities causing calf ulceration.   Recommend:  Abstinence from all tobacco products. Blood glucose control with goal A1c < 7%. Blood pressure control with goal blood pressure < 130/80 mmHg. Lipid reduction therapy with goal LDL-C < 55 mg/dL. Aspirin  81mg  by mouth daily. Atorvastatin  40-80mg  PO QD (or other high intensity statin therapy).  Persistent ischemic ulceration of the left calf. She suffered NSTEMI earlier this year and could not be revascularized. She has persistent pain about the ulcer. I refilled her pain medicine and referred her to pain management. I will see her again after cardiology evaluation to see if we can proceed with left femoral endarterectomy safely from a cardiology perspective.    CHIEF COMPLAINT: left calf painful ulceration  HISTORY OF PRESENT ILLNESS: Tina Hester is a 83 y.o. female returns to clinic for evaluation of painful left calf ulcer.  The patient continues to require narcotic pain medicine, and counseled her that she needs to be reevaluated before we can prescribe more narcotics.  She is due to see the cardiology team next month to discuss her recent NSTEMI.  She was previously not a candidate that time for percutaneous intervention or coronary artery bypass grafting.  I counseled that left femoral endarterectomy would be her best option for revascularization but this would expose her to high risk of recurrent MI and even death if not optimized from a cardiology perspective first.  She and her daughter seem to understand this well.  Past Medical History:  Diagnosis Date   Anemia    s/p Heme work -up normal EGD and colonscopy in 2012 per pt,neg SPEP   Anxiety    Aortic stenosis    Mild by echo 12/2021 with mean aortic valve gradient 17 mmHg   Bilateral carotid artery stenosis 06/08/2015   1-39% bilateral    Bradycardia    CKD (chronic kidney disease), stage III (HCC)    Diabetes mellitus without complication (HCC)    type 2   GERD (gastroesophageal reflux disease)    Gout    HSV-1 (herpes simplex virus 1) infection    Acyclovir  prn   Hyperkalemia    Hyperlipidemia    Hypertension    Joint pain    osteoarthritis by Xray- possible erosion of R 4th MCP,elevated uric  acid ,ANA +    MCI (mild cognitive impairment)    Morbid obesity (HCC)    OSA on CPAP    Osteopenia    PAF (paroxysmal atrial fibrillation) (HCC)    Pain management    Neurosurg: Dr Gerri Kras   Pulmonary HTN (HCC)    Moderate by echo  2016. PASP   TIA (transient ischemic attack)    remote history of TIA's early 2000    Past Surgical History:  Procedure Laterality Date   ABDOMINAL AORTOGRAM W/LOWER EXTREMITY N/A 11/11/2023   Procedure: ABDOMINAL AORTOGRAM W/LOWER EXTREMITY;  Surgeon: Carlene Che, MD;  Location: [facility];  Service: Cardiovascular;  Laterality: N/A;   ABDOMINAL HYSTERECTOMY     APPENDECTOMY     BACK SURGERY     cervical fusion   BLADDER SURGERY     CESAREAN SECTION     CHOLECYSTECTOMY     LEFT HEART CATH AND CORONARY ANGIOGRAPHY N/A 11/10/2023   Procedure: LEFT HEART CATH AND CORONARY ANGIOGRAPHY;  Surgeon: Odie Benne, MD;  Location: [facility];  Service: Cardiovascular;  Laterality: N/A;   LOWER EXTREMITY ANGIOGRAPHY N/A 02/25/2023   Procedure: Lower Extremity Angiography;  Surgeon: Carlene Che, MD;  Location[facility] INVASIVE CV LAB;  Service: Cardiovascular;  Laterality: N/A;   LOWER EXTREMITY ANGIOGRAPHY N/A 11/11/2023   Procedure: Lower Extremity Angiography;  Surgeon: Carlene Che, MD;  Location: [facility] INVASIVE CV LAB;  Service: Cardiovascular;  Laterality: N/A;   LOWER EXTREMITY INTERVENTION N/A 11/11/2023   Procedure: LOWER EXTREMITY INTERVENTION;  Surgeon: Carlene Che, MD;  Location: [facility];  Service: Cardiovascular;  Laterality: N/A;    PERIPHERAL VASCULAR INTERVENTION  02/25/2023   Procedure: PERIPHERAL VASCULAR INTERVENTION;  Surgeon: Carlene Che, MD;  Location: [facility];  Service: Cardiovascular;;    Family History  Problem Relation Age of Onset   Arrhythmia Brother     Social History   Socioeconomic History   Marital status: Divorced    Spouse name: Not on file   Number of children: 4   Years of education: Not on file   Highest education level: Some college, no degree  Occupational History   Not on file  Tobacco Use   Smoking status: Never   Smokeless tobacco: Never  Vaping Use   Vaping status: Never Used  Substance and Sexual Activity   Alcohol use: No   Drug use: No   Sexual activity: Not on file  Other Topics Concern   Not on file  Social History Narrative   10/27/21 lives with daughter, Josephine Nicolas   Some soda   Social Drivers of Health   Financial Resource Strain: Not on file  Food Insecurity: No Food Insecurity (01/19/2024)   Received from [facility]   Hunger Vital Sign    Worried About Running Out of Food in the Last Year: Never true    Ran Out of Food in the Last Year: Never true  Transportation Needs: No Transportation Needs (01/19/2024)   Received from [facility]   PRAPARE - Transportation    Lack of Transportation (Medical): No    Lack of Transportation (Non-Medical): No  Physical Activity: Not on file  Stress: Not on file  Social Connections: Unknown (11/10/2023)   Social Connection and Isolation Panel [NHANES]    Frequency of Communication with Friends and Family: Patient unable to answer    Frequency of Social Gatherings with Friends and Family: Patient unable to answer    Attends Religious Services: Patient unable to answer    Active Member of Clubs or Organizations: Patient unable to answer    Attends Banker Meetings: Patient unable to answer    Marital Status: Divorced  Intimate Partner Violence: Patient Unable To Answer  (11/10/2023)   Humiliation, Afraid, Rape, and Kick questionnaire    Fear of Current or Ex-Partner: Patient unable to answer    Emotionally Abused: Patient unable to answer    Physically Abused: Patient unable to answer    Sexually Abused: Patient unable to answer    Allergies  Allergen Reactions   Pneumococcal Vaccine Anaphylaxis    weakness   Sulfa Antibiotics Hives   Pregabalin Other (See Comments)    Other reaction(s): depression, patient unsure    Current Outpatient Medications  Medication Sig Dispense Refill   doxycycline  (VIBRAMYCIN ) 100 MG capsule Take 100 mg by mouth 2 (two) times daily.     acetaminophen  (TYLENOL ) 325 MG tablet Take 650 mg by mouth every 4 (four) hours as needed for mild pain (pain score 1-3) or  moderate pain (pain score 4-6).     amoxicillin-clavulanate (AUGMENTIN) 250-62.5 MG/5ML suspension Take 10 mLs by mouth 2 (two) times daily. (Patient not taking: Reported on 02/07/2024)     CALMOSEPTINE 0.44-20.6 % OINT Apply 1 Application topically daily as needed (Skin irritation).     clopidogrel  (PLAVIX ) 75 MG tablet Take 75 mg by mouth daily.     donepezil  (ARICEPT ) 5 MG tablet Take 5 mg by mouth daily.     ELIQUIS  5 MG TABS tablet TAKE 1 TABLET(5 MG) BY MOUTH TWICE DAILY 180 tablet 1   empagliflozin  (JARDIANCE ) 10 MG TABS tablet Take 10 mg by mouth daily.     ezetimibe  (ZETIA ) 10 MG tablet Take 10 mg by mouth every evening.     furosemide  (LASIX ) 20 MG tablet Take 20 mg by mouth as needed for fluid or edema.     gabapentin  (NEURONTIN ) 300 MG capsule Take 1 capsule (300 mg total) by mouth at bedtime.     HYDROcodone -acetaminophen  (NORCO/VICODIN) 5-325 MG tablet Take 1 tablet by mouth every 6 (six) hours as needed for moderate pain (pain score 4-6). (Patient not taking: Reported on 02/07/2024) 30 tablet 0   insulin  lispro (HUMALOG ) 100 UNIT/ML injection Inject 1-7 Units into the skin 3 (three) times daily before meals. Sliding scale 141-180=1 unit,181-220= 2  units,221-260= 3 units,261-300=4 units, 301-340=5 units, 341-380=6 units, 381-400 = 7 units,> 400 call MD     Insulin  NPH, Human,, Isophane, (NOVOLIN  N FLEXPEN) 100 UNIT/ML Kiwkpen Inject 15 units at morning and 18 units at night (Patient taking differently: Inject 14-18 Units into the skin See admin instructions. Inject 14 units at morning and 18 units at night) 15 mL 2   levothyroxine  (SYNTHROID ) 150 MCG tablet Take 150 mcg by mouth daily before breakfast.     metoprolol  succinate (TOPROL -XL) 25 MG 24 hr tablet Take 1 tablet (25 mg total) by mouth 2 (two) times daily. 180 tablet 3   omeprazole (PRILOSEC) 20 MG capsule Take 20 mg by mouth daily as needed (acid reflux).     oxyCODONE -acetaminophen  (PERCOCET/ROXICET) 5-325 MG tablet Take 1 tablet by mouth every 4 (four) hours as needed for severe pain (pain score 7-10). 45 tablet 0   OZEMPIC , 0.25 OR 0.5 MG/DOSE, 2 MG/3ML SOPN Inject 0.25 mg into the skin once a week. Sundays     polyethylene glycol (MIRALAX  / GLYCOLAX ) 17 g packet Take 17 g by mouth daily as needed. 14 each 0   rosuvastatin  (CRESTOR ) 20 MG tablet Take 1 tablet (20 mg total) by mouth daily. 30 tablet 0   SANTYL 250 UNIT/GM ointment Apply 1 Application topically as needed.     sertraline  (ZOLOFT ) 50 MG tablet Take 50 mg by mouth daily.     No current facility-administered medications for this visit.    PHYSICAL EXAM Vitals:   02/07/24 1508  BP: (!) 147/57  Pulse: 74  Temp: 97.9 F (36.6 C)  SpO2: 97%   Elderly woman in no distress Regular rate and rhythm Unlabored breathing Unchanged appearance of left lateral calf wound No palpable pedal pulses in the left  PERTINENT LABORATORY AND RADIOLOGIC DATA  Most recent CBC    Latest Ref Rng & Units 11/17/2023    3:07 AM 11/16/2023    3:08 AM 11/15/2023    4:08 AM  CBC  WBC 4.0 - 10.5 K/uL 8.0  7.0  7.5   Hemoglobin 12.0 - 15.0 g/dL 9.7  9.8  9.6   Hematocrit 36.0 - 46.0 % 30.5  30.7  30.6   Platelets 150 - 400 K/uL 162   171  171      Most recent CMP    Latest Ref Rng & Units 11/17/2023    3:07 AM 11/16/2023    3:08 AM 11/15/2023    4:08 AM  CMP  Glucose 70 - 99 mg/dL 098  119  147   BUN 8 - 23 mg/dL 17  22  25    Creatinine 0.44 - 1.00 mg/dL 8.29  5.62  1.30   Sodium 135 - 145 mmol/L 135  136  136   Potassium 3.5 - 5.1 mmol/L 4.2  3.7  3.8   Chloride 98 - 111 mmol/L 107  107  105   CO2 22 - 32 mmol/L 22  23  21    Calcium  8.9 - 10.3 mg/dL 8.0  8.1  8.1     Renal function CrCl cannot be calculated (Patient's most recent lab result is older than the maximum 21 days allowed.).  Hemoglobin A1C (no units)  Date Value  08/17/2022 8.7   Hgb A1c MFr Bld (%)  Date Value  11/11/2023 7.3 (H)    LDL Chol Calc (NIH)  Date Value Ref Range Status  12/18/2021 86 0 - 99 mg/dL Final   LDL Cholesterol  Date Value Ref Range Status  11/12/2023 48 0 - 99 mg/dL Final    Comment:           Total Cholesterol/HDL:CHD Risk Coronary Heart Disease Risk Table                     Men   Women  1/2 Average Risk   3.4   3.3  Average Risk       5.0   4.4  2 X Average Risk   9.6   7.1  3 X Average Risk  23.4   11.0        Use the calculated Patient Ratio above and the CHD Risk Table to determine the patient's CHD Risk.        ATP III CLASSIFICATION (LDL):  <100     mg/dL   Optimal  865-784  mg/dL   Near or Above                    Optimal  130-159  mg/dL   Borderline  696-295  mg/dL   High  >284     mg/dL   Very High Performed at [facility], [address], [place], [place] [address]    Direct LDL  Date Value Ref Range Status  08/06/2020 81.0 mg/dL Final    Comment:    Optimal:  <100 mg/dLNear or Above Optimal:  100-129 mg/dLBorderline High:  130-159 mg/dLHigh:  160-189 mg/dLVery High:  >190 mg/dL     Heber Little. Edgardo Goodwill, MD FACS [facility] Phone Number: [phone] 02/08/2024 10:15 AM   Total time spent on preparing this encounter including chart  review, data review, collecting history, examining the patient, and coordinating care: 40 minutes  Portions of this report may have been transcribed using voice recognition software.  Every effort has been made to ensure accuracy; however, inadvertent computerized transcription errors may still be present.

## 2024-02-08 DIAGNOSIS — B0050 Herpesviral ocular disease, unspecified: Secondary | ICD-10-CM | POA: Diagnosis not present

## 2024-02-08 DIAGNOSIS — H04129 Dry eye syndrome of unspecified lacrimal gland: Secondary | ICD-10-CM | POA: Diagnosis not present

## 2024-02-09 ENCOUNTER — Other Ambulatory Visit (HOSPITAL_COMMUNITY): Payer: Self-pay | Admitting: General Surgery

## 2024-02-09 DIAGNOSIS — D631 Anemia in chronic kidney disease: Secondary | ICD-10-CM | POA: Diagnosis not present

## 2024-02-09 DIAGNOSIS — E1169 Type 2 diabetes mellitus with other specified complication: Secondary | ICD-10-CM | POA: Diagnosis not present

## 2024-02-09 DIAGNOSIS — I5042 Chronic combined systolic (congestive) and diastolic (congestive) heart failure: Secondary | ICD-10-CM | POA: Diagnosis not present

## 2024-02-09 DIAGNOSIS — M8580 Other specified disorders of bone density and structure, unspecified site: Secondary | ICD-10-CM | POA: Diagnosis not present

## 2024-02-09 DIAGNOSIS — K219 Gastro-esophageal reflux disease without esophagitis: Secondary | ICD-10-CM | POA: Diagnosis not present

## 2024-02-09 DIAGNOSIS — E11621 Type 2 diabetes mellitus with foot ulcer: Secondary | ICD-10-CM | POA: Diagnosis not present

## 2024-02-09 DIAGNOSIS — F02A18 Dementia in other diseases classified elsewhere, mild, with other behavioral disturbance: Secondary | ICD-10-CM | POA: Diagnosis not present

## 2024-02-09 DIAGNOSIS — I350 Nonrheumatic aortic (valve) stenosis: Secondary | ICD-10-CM | POA: Diagnosis not present

## 2024-02-09 DIAGNOSIS — E1151 Type 2 diabetes mellitus with diabetic peripheral angiopathy without gangrene: Secondary | ICD-10-CM | POA: Diagnosis not present

## 2024-02-09 DIAGNOSIS — I872 Venous insufficiency (chronic) (peripheral): Secondary | ICD-10-CM | POA: Diagnosis not present

## 2024-02-09 DIAGNOSIS — L97829 Non-pressure chronic ulcer of other part of left lower leg with unspecified severity: Secondary | ICD-10-CM | POA: Diagnosis not present

## 2024-02-09 DIAGNOSIS — E039 Hypothyroidism, unspecified: Secondary | ICD-10-CM | POA: Diagnosis not present

## 2024-02-09 DIAGNOSIS — M1990 Unspecified osteoarthritis, unspecified site: Secondary | ICD-10-CM | POA: Diagnosis not present

## 2024-02-09 DIAGNOSIS — E1122 Type 2 diabetes mellitus with diabetic chronic kidney disease: Secondary | ICD-10-CM | POA: Diagnosis not present

## 2024-02-09 DIAGNOSIS — L97519 Non-pressure chronic ulcer of other part of right foot with unspecified severity: Secondary | ICD-10-CM | POA: Diagnosis not present

## 2024-02-09 DIAGNOSIS — G4733 Obstructive sleep apnea (adult) (pediatric): Secondary | ICD-10-CM | POA: Diagnosis not present

## 2024-02-09 DIAGNOSIS — I130 Hypertensive heart and chronic kidney disease with heart failure and stage 1 through stage 4 chronic kidney disease, or unspecified chronic kidney disease: Secondary | ICD-10-CM | POA: Diagnosis not present

## 2024-02-09 DIAGNOSIS — F02A4 Dementia in other diseases classified elsewhere, mild, with anxiety: Secondary | ICD-10-CM | POA: Diagnosis not present

## 2024-02-09 DIAGNOSIS — M869 Osteomyelitis, unspecified: Secondary | ICD-10-CM | POA: Diagnosis not present

## 2024-02-09 DIAGNOSIS — I252 Old myocardial infarction: Secondary | ICD-10-CM | POA: Diagnosis not present

## 2024-02-09 DIAGNOSIS — I4821 Permanent atrial fibrillation: Secondary | ICD-10-CM | POA: Diagnosis not present

## 2024-02-09 DIAGNOSIS — N1832 Chronic kidney disease, stage 3b: Secondary | ICD-10-CM | POA: Diagnosis not present

## 2024-02-09 DIAGNOSIS — E782 Mixed hyperlipidemia: Secondary | ICD-10-CM | POA: Diagnosis not present

## 2024-02-14 ENCOUNTER — Inpatient Hospital Stay (HOSPITAL_COMMUNITY)

## 2024-02-14 ENCOUNTER — Other Ambulatory Visit: Payer: Self-pay

## 2024-02-14 ENCOUNTER — Inpatient Hospital Stay (HOSPITAL_COMMUNITY)
Admission: EM | Admit: 2024-02-14 | Discharge: 2024-02-28 | DRG: 628 | Disposition: E | Attending: Critical Care Medicine | Admitting: Critical Care Medicine

## 2024-02-14 DIAGNOSIS — Z7901 Long term (current) use of anticoagulants: Secondary | ICD-10-CM

## 2024-02-14 DIAGNOSIS — Z7401 Bed confinement status: Secondary | ICD-10-CM | POA: Diagnosis not present

## 2024-02-14 DIAGNOSIS — E8721 Acute metabolic acidosis: Secondary | ICD-10-CM | POA: Diagnosis not present

## 2024-02-14 DIAGNOSIS — I4892 Unspecified atrial flutter: Secondary | ICD-10-CM | POA: Diagnosis present

## 2024-02-14 DIAGNOSIS — Z7982 Long term (current) use of aspirin: Secondary | ICD-10-CM

## 2024-02-14 DIAGNOSIS — I110 Hypertensive heart disease with heart failure: Secondary | ICD-10-CM | POA: Diagnosis not present

## 2024-02-14 DIAGNOSIS — I25118 Atherosclerotic heart disease of native coronary artery with other forms of angina pectoris: Secondary | ICD-10-CM | POA: Diagnosis not present

## 2024-02-14 DIAGNOSIS — E119 Type 2 diabetes mellitus without complications: Secondary | ICD-10-CM | POA: Diagnosis not present

## 2024-02-14 DIAGNOSIS — I272 Pulmonary hypertension, unspecified: Secondary | ICD-10-CM | POA: Diagnosis not present

## 2024-02-14 DIAGNOSIS — M869 Osteomyelitis, unspecified: Secondary | ICD-10-CM | POA: Diagnosis present

## 2024-02-14 DIAGNOSIS — R578 Other shock: Secondary | ICD-10-CM | POA: Diagnosis not present

## 2024-02-14 DIAGNOSIS — R579 Shock, unspecified: Secondary | ICD-10-CM

## 2024-02-14 DIAGNOSIS — Z4682 Encounter for fitting and adjustment of non-vascular catheter: Secondary | ICD-10-CM | POA: Diagnosis not present

## 2024-02-14 DIAGNOSIS — I2510 Atherosclerotic heart disease of native coronary artery without angina pectoris: Secondary | ICD-10-CM | POA: Diagnosis not present

## 2024-02-14 DIAGNOSIS — Y838 Other surgical procedures as the cause of abnormal reaction of the patient, or of later complication, without mention of misadventure at the time of the procedure: Secondary | ICD-10-CM | POA: Diagnosis present

## 2024-02-14 DIAGNOSIS — T148XXA Other injury of unspecified body region, initial encounter: Secondary | ICD-10-CM

## 2024-02-14 DIAGNOSIS — Z515 Encounter for palliative care: Secondary | ICD-10-CM | POA: Diagnosis not present

## 2024-02-14 DIAGNOSIS — I517 Cardiomegaly: Secondary | ICD-10-CM | POA: Diagnosis not present

## 2024-02-14 DIAGNOSIS — Z794 Long term (current) use of insulin: Secondary | ICD-10-CM

## 2024-02-14 DIAGNOSIS — L97229 Non-pressure chronic ulcer of left calf with unspecified severity: Secondary | ICD-10-CM | POA: Diagnosis present

## 2024-02-14 DIAGNOSIS — E785 Hyperlipidemia, unspecified: Secondary | ICD-10-CM | POA: Diagnosis not present

## 2024-02-14 DIAGNOSIS — M25441 Effusion, right hand: Secondary | ICD-10-CM | POA: Diagnosis not present

## 2024-02-14 DIAGNOSIS — D65 Disseminated intravascular coagulation [defibrination syndrome]: Secondary | ICD-10-CM | POA: Diagnosis not present

## 2024-02-14 DIAGNOSIS — E1151 Type 2 diabetes mellitus with diabetic peripheral angiopathy without gangrene: Secondary | ICD-10-CM | POA: Diagnosis not present

## 2024-02-14 DIAGNOSIS — D631 Anemia in chronic kidney disease: Secondary | ICD-10-CM | POA: Diagnosis present

## 2024-02-14 DIAGNOSIS — E11628 Type 2 diabetes mellitus with other skin complications: Secondary | ICD-10-CM | POA: Diagnosis present

## 2024-02-14 DIAGNOSIS — I4891 Unspecified atrial fibrillation: Secondary | ICD-10-CM | POA: Diagnosis not present

## 2024-02-14 DIAGNOSIS — Z7189 Other specified counseling: Secondary | ICD-10-CM | POA: Diagnosis not present

## 2024-02-14 DIAGNOSIS — I129 Hypertensive chronic kidney disease with stage 1 through stage 4 chronic kidney disease, or unspecified chronic kidney disease: Secondary | ICD-10-CM | POA: Diagnosis not present

## 2024-02-14 DIAGNOSIS — Z95820 Peripheral vascular angioplasty status with implants and grafts: Secondary | ICD-10-CM | POA: Diagnosis not present

## 2024-02-14 DIAGNOSIS — Z01818 Encounter for other preprocedural examination: Secondary | ICD-10-CM

## 2024-02-14 DIAGNOSIS — I70269 Atherosclerosis of native arteries of extremities with gangrene, unspecified extremity: Secondary | ICD-10-CM | POA: Diagnosis not present

## 2024-02-14 DIAGNOSIS — I350 Nonrheumatic aortic (valve) stenosis: Secondary | ICD-10-CM | POA: Diagnosis present

## 2024-02-14 DIAGNOSIS — Z87892 Personal history of anaphylaxis: Secondary | ICD-10-CM

## 2024-02-14 DIAGNOSIS — Z8673 Personal history of transient ischemic attack (TIA), and cerebral infarction without residual deficits: Secondary | ICD-10-CM

## 2024-02-14 DIAGNOSIS — L03031 Cellulitis of right toe: Principal | ICD-10-CM | POA: Diagnosis present

## 2024-02-14 DIAGNOSIS — G894 Chronic pain syndrome: Secondary | ICD-10-CM | POA: Diagnosis not present

## 2024-02-14 DIAGNOSIS — E1169 Type 2 diabetes mellitus with other specified complication: Secondary | ICD-10-CM | POA: Diagnosis not present

## 2024-02-14 DIAGNOSIS — I70235 Atherosclerosis of native arteries of right leg with ulceration of other part of foot: Secondary | ICD-10-CM | POA: Diagnosis present

## 2024-02-14 DIAGNOSIS — S301XXA Contusion of abdominal wall, initial encounter: Secondary | ICD-10-CM | POA: Diagnosis not present

## 2024-02-14 DIAGNOSIS — I97618 Postprocedural hemorrhage and hematoma of a circulatory system organ or structure following other circulatory system procedure: Secondary | ICD-10-CM | POA: Diagnosis not present

## 2024-02-14 DIAGNOSIS — R570 Cardiogenic shock: Secondary | ICD-10-CM | POA: Diagnosis not present

## 2024-02-14 DIAGNOSIS — F0390 Unspecified dementia without behavioral disturbance: Secondary | ICD-10-CM | POA: Diagnosis not present

## 2024-02-14 DIAGNOSIS — I509 Heart failure, unspecified: Secondary | ICD-10-CM | POA: Diagnosis not present

## 2024-02-14 DIAGNOSIS — Z6833 Body mass index (BMI) 33.0-33.9, adult: Secondary | ICD-10-CM

## 2024-02-14 DIAGNOSIS — I6523 Occlusion and stenosis of bilateral carotid arteries: Secondary | ICD-10-CM | POA: Diagnosis not present

## 2024-02-14 DIAGNOSIS — L97519 Non-pressure chronic ulcer of other part of right foot with unspecified severity: Secondary | ICD-10-CM | POA: Diagnosis not present

## 2024-02-14 DIAGNOSIS — I252 Old myocardial infarction: Secondary | ICD-10-CM

## 2024-02-14 DIAGNOSIS — E039 Hypothyroidism, unspecified: Secondary | ICD-10-CM | POA: Diagnosis present

## 2024-02-14 DIAGNOSIS — M19071 Primary osteoarthritis, right ankle and foot: Secondary | ICD-10-CM | POA: Diagnosis not present

## 2024-02-14 DIAGNOSIS — Z7985 Long-term (current) use of injectable non-insulin antidiabetic drugs: Secondary | ICD-10-CM

## 2024-02-14 DIAGNOSIS — I10 Essential (primary) hypertension: Secondary | ICD-10-CM | POA: Diagnosis not present

## 2024-02-14 DIAGNOSIS — Z452 Encounter for adjustment and management of vascular access device: Secondary | ICD-10-CM | POA: Diagnosis not present

## 2024-02-14 DIAGNOSIS — Z882 Allergy status to sulfonamides status: Secondary | ICD-10-CM

## 2024-02-14 DIAGNOSIS — N1832 Chronic kidney disease, stage 3b: Secondary | ICD-10-CM | POA: Diagnosis not present

## 2024-02-14 DIAGNOSIS — Z993 Dependence on wheelchair: Secondary | ICD-10-CM

## 2024-02-14 DIAGNOSIS — R600 Localized edema: Secondary | ICD-10-CM | POA: Diagnosis not present

## 2024-02-14 DIAGNOSIS — I4819 Other persistent atrial fibrillation: Secondary | ICD-10-CM | POA: Diagnosis present

## 2024-02-14 DIAGNOSIS — E1159 Type 2 diabetes mellitus with other circulatory complications: Secondary | ICD-10-CM | POA: Diagnosis not present

## 2024-02-14 DIAGNOSIS — J9601 Acute respiratory failure with hypoxia: Secondary | ICD-10-CM | POA: Diagnosis not present

## 2024-02-14 DIAGNOSIS — Z90710 Acquired absence of both cervix and uterus: Secondary | ICD-10-CM

## 2024-02-14 DIAGNOSIS — D62 Acute posthemorrhagic anemia: Secondary | ICD-10-CM | POA: Diagnosis not present

## 2024-02-14 DIAGNOSIS — I97638 Postprocedural hematoma of a circulatory system organ or structure following other circulatory system procedure: Secondary | ICD-10-CM | POA: Diagnosis not present

## 2024-02-14 DIAGNOSIS — E1122 Type 2 diabetes mellitus with diabetic chronic kidney disease: Secondary | ICD-10-CM | POA: Diagnosis present

## 2024-02-14 DIAGNOSIS — E1165 Type 2 diabetes mellitus with hyperglycemia: Secondary | ICD-10-CM | POA: Diagnosis present

## 2024-02-14 DIAGNOSIS — I951 Orthostatic hypotension: Secondary | ICD-10-CM | POA: Diagnosis not present

## 2024-02-14 DIAGNOSIS — Z789 Other specified health status: Secondary | ICD-10-CM | POA: Diagnosis not present

## 2024-02-14 DIAGNOSIS — K219 Gastro-esophageal reflux disease without esophagitis: Secondary | ICD-10-CM | POA: Diagnosis present

## 2024-02-14 DIAGNOSIS — G4733 Obstructive sleep apnea (adult) (pediatric): Secondary | ICD-10-CM | POA: Diagnosis not present

## 2024-02-14 DIAGNOSIS — E1142 Type 2 diabetes mellitus with diabetic polyneuropathy: Secondary | ICD-10-CM | POA: Diagnosis present

## 2024-02-14 DIAGNOSIS — Z66 Do not resuscitate: Secondary | ICD-10-CM | POA: Diagnosis not present

## 2024-02-14 DIAGNOSIS — M009 Pyogenic arthritis, unspecified: Secondary | ICD-10-CM | POA: Diagnosis present

## 2024-02-14 DIAGNOSIS — Z79899 Other long term (current) drug therapy: Secondary | ICD-10-CM

## 2024-02-14 DIAGNOSIS — Z7984 Long term (current) use of oral hypoglycemic drugs: Secondary | ICD-10-CM

## 2024-02-14 DIAGNOSIS — M62261 Nontraumatic ischemic infarction of muscle, right lower leg: Secondary | ICD-10-CM | POA: Diagnosis not present

## 2024-02-14 DIAGNOSIS — S91101A Unspecified open wound of right great toe without damage to nail, initial encounter: Secondary | ICD-10-CM | POA: Diagnosis not present

## 2024-02-14 DIAGNOSIS — I70238 Atherosclerosis of native arteries of right leg with ulceration of other part of lower right leg: Secondary | ICD-10-CM | POA: Diagnosis not present

## 2024-02-14 DIAGNOSIS — R0989 Other specified symptoms and signs involving the circulatory and respiratory systems: Secondary | ICD-10-CM | POA: Diagnosis not present

## 2024-02-14 DIAGNOSIS — E11621 Type 2 diabetes mellitus with foot ulcer: Secondary | ICD-10-CM | POA: Diagnosis not present

## 2024-02-14 DIAGNOSIS — R296 Repeated falls: Secondary | ICD-10-CM | POA: Diagnosis not present

## 2024-02-14 DIAGNOSIS — E872 Acidosis, unspecified: Secondary | ICD-10-CM | POA: Diagnosis not present

## 2024-02-14 DIAGNOSIS — Z79890 Hormone replacement therapy (postmenopausal): Secondary | ICD-10-CM

## 2024-02-14 DIAGNOSIS — I472 Ventricular tachycardia, unspecified: Secondary | ICD-10-CM | POA: Diagnosis not present

## 2024-02-14 DIAGNOSIS — Z7902 Long term (current) use of antithrombotics/antiplatelets: Secondary | ICD-10-CM

## 2024-02-14 DIAGNOSIS — M79676 Pain in unspecified toe(s): Secondary | ICD-10-CM | POA: Diagnosis not present

## 2024-02-14 DIAGNOSIS — N183 Chronic kidney disease, stage 3 unspecified: Secondary | ICD-10-CM | POA: Diagnosis not present

## 2024-02-14 DIAGNOSIS — Z981 Arthrodesis status: Secondary | ICD-10-CM

## 2024-02-14 DIAGNOSIS — N179 Acute kidney failure, unspecified: Secondary | ICD-10-CM | POA: Diagnosis not present

## 2024-02-14 DIAGNOSIS — R0902 Hypoxemia: Secondary | ICD-10-CM | POA: Diagnosis not present

## 2024-02-14 DIAGNOSIS — E875 Hyperkalemia: Secondary | ICD-10-CM | POA: Diagnosis not present

## 2024-02-14 DIAGNOSIS — L03115 Cellulitis of right lower limb: Secondary | ICD-10-CM | POA: Diagnosis present

## 2024-02-14 DIAGNOSIS — E66811 Obesity, class 1: Secondary | ICD-10-CM | POA: Diagnosis present

## 2024-02-14 DIAGNOSIS — I70242 Atherosclerosis of native arteries of left leg with ulceration of calf: Secondary | ICD-10-CM | POA: Diagnosis not present

## 2024-02-14 LAB — C-REACTIVE PROTEIN: CRP: 2.2 mg/dL — ABNORMAL HIGH (ref <1.0)

## 2024-02-14 LAB — CBC WITH DIFFERENTIAL/PLATELET
Abs Immature Granulocytes: 0.03 10*3/uL (ref 0.00–0.07)
Basophils Absolute: 0.1 10*3/uL (ref 0.0–0.1)
Basophils Relative: 1 %
Eosinophils Absolute: 0.1 10*3/uL (ref 0.0–0.5)
Eosinophils Relative: 1 %
HCT: 35 % — ABNORMAL LOW (ref 36.0–46.0)
Hemoglobin: 10.8 g/dL — ABNORMAL LOW (ref 12.0–15.0)
Immature Granulocytes: 0 %
Lymphocytes Relative: 20 %
Lymphs Abs: 1.8 10*3/uL (ref 0.7–4.0)
MCH: 27.1 pg (ref 26.0–34.0)
MCHC: 30.9 g/dL (ref 30.0–36.0)
MCV: 87.9 fL (ref 80.0–100.0)
Monocytes Absolute: 1.1 10*3/uL — ABNORMAL HIGH (ref 0.1–1.0)
Monocytes Relative: 12 %
Neutro Abs: 5.9 10*3/uL (ref 1.7–7.7)
Neutrophils Relative %: 66 %
Platelets: 188 10*3/uL (ref 150–400)
RBC: 3.98 MIL/uL (ref 3.87–5.11)
RDW: 16 % — ABNORMAL HIGH (ref 11.5–15.5)
WBC: 9 10*3/uL (ref 4.0–10.5)
nRBC: 0 % (ref 0.0–0.2)

## 2024-02-14 LAB — BASIC METABOLIC PANEL WITH GFR
Anion gap: 9 (ref 5–15)
BUN: 23 mg/dL (ref 8–23)
CO2: 22 mmol/L (ref 22–32)
Calcium: 8.4 mg/dL — ABNORMAL LOW (ref 8.9–10.3)
Chloride: 102 mmol/L (ref 98–111)
Creatinine, Ser: 1.32 mg/dL — ABNORMAL HIGH (ref 0.44–1.00)
GFR, Estimated: 40 mL/min — ABNORMAL LOW (ref >60)
Glucose, Bld: 207 mg/dL — ABNORMAL HIGH (ref 70–99)
Potassium: 3.8 mmol/L (ref 3.5–5.1)
Sodium: 133 mmol/L — ABNORMAL LOW (ref 135–145)

## 2024-02-14 LAB — GLUCOSE, CAPILLARY
Glucose-Capillary: 74 mg/dL (ref 70–99)
Glucose-Capillary: 182 mg/dL — ABNORMAL HIGH (ref 70–99)

## 2024-02-14 LAB — SEDIMENTATION RATE: Sed Rate: 41 mm/h — ABNORMAL HIGH (ref 0–22)

## 2024-02-14 MED ORDER — EZETIMIBE 10 MG PO TABS
10.0000 mg | ORAL_TABLET | Freq: Every evening | ORAL | Status: DC
  Administered 2024-02-14 – 2024-02-16 (×3): 10 mg via ORAL
  Filled 2024-02-14 (×3): qty 1

## 2024-02-14 MED ORDER — METRONIDAZOLE 500 MG/100ML IV SOLN: 500.0000 mg | Freq: Two times a day (BID) | INTRAVENOUS | Status: DC

## 2024-02-14 MED ORDER — INSULIN ASPART 100 UNIT/ML IJ SOLN
0.0000 [IU] | Freq: Every day | INTRAMUSCULAR | Status: DC
  Filled 2024-02-14: qty 0.05

## 2024-02-14 MED ORDER — ACETAMINOPHEN 325 MG PO TABS
650.0000 mg | ORAL_TABLET | Freq: Four times a day (QID) | ORAL | Status: DC | PRN
  Administered 2024-02-17: 650 mg via ORAL

## 2024-02-14 MED ORDER — SENNOSIDES-DOCUSATE SODIUM 8.6-50 MG PO TABS: 1.0000 | ORAL_TABLET | Freq: Every evening | ORAL | Status: DC | PRN

## 2024-02-14 MED ORDER — ACETAMINOPHEN 650 MG RE SUPP: 650.0000 mg | Freq: Four times a day (QID) | RECTAL | Status: DC | PRN

## 2024-02-14 MED ORDER — VANCOMYCIN HCL 1500 MG/300ML IV SOLN
1500.0000 mg | Freq: Once | INTRAVENOUS | Status: AC
  Administered 2024-02-14: 1500 mg via INTRAVENOUS
  Filled 2024-02-14: qty 300

## 2024-02-14 MED ORDER — IRBESARTAN 150 MG PO TABS
150.0000 mg | ORAL_TABLET | Freq: Every day | ORAL | Status: DC
  Administered 2024-02-15 – 2024-02-16 (×2): 150 mg via ORAL
  Filled 2024-02-14 (×2): qty 1

## 2024-02-14 MED ORDER — GABAPENTIN 300 MG PO CAPS
300.0000 mg | ORAL_CAPSULE | Freq: Every day | ORAL | Status: DC
  Administered 2024-02-14 – 2024-02-17 (×4): 300 mg via ORAL
  Filled 2024-02-14 (×4): qty 1

## 2024-02-14 MED ORDER — VANCOMYCIN HCL IN DEXTROSE 1-5 GM/200ML-% IV SOLN
1000.0000 mg | Freq: Once | INTRAVENOUS | Status: DC
  Filled 2024-02-14: qty 200

## 2024-02-14 MED ORDER — MORPHINE SULFATE (PF) 2 MG/ML IV SOLN
2.0000 mg | INTRAVENOUS | Status: DC | PRN
  Administered 2024-02-17: 2 mg via INTRAVENOUS
  Filled 2024-02-14: qty 1

## 2024-02-14 MED ORDER — ENOXAPARIN SODIUM 40 MG/0.4ML IJ SOSY: 40.0000 mg | PREFILLED_SYRINGE | INTRAMUSCULAR | Status: DC

## 2024-02-14 MED ORDER — SERTRALINE HCL 50 MG PO TABS
50.0000 mg | ORAL_TABLET | Freq: Every day | ORAL | Status: DC
  Administered 2024-02-15 – 2024-02-16 (×2): 50 mg via ORAL
  Filled 2024-02-14 (×2): qty 1

## 2024-02-14 MED ORDER — INSULIN ASPART 100 UNIT/ML IJ SOLN
0.0000 [IU] | Freq: Three times a day (TID) | INTRAMUSCULAR | Status: DC
  Administered 2024-02-15: 2 [IU] via SUBCUTANEOUS
  Administered 2024-02-15: 5 [IU] via SUBCUTANEOUS
  Administered 2024-02-16: 2 [IU] via SUBCUTANEOUS
  Administered 2024-02-16 – 2024-02-18 (×2): 3 [IU] via SUBCUTANEOUS
  Filled 2024-02-14: qty 0.15

## 2024-02-14 MED ORDER — SODIUM CHLORIDE 0.9 % IV SOLN
3.0000 g | Freq: Three times a day (TID) | INTRAVENOUS | Status: DC
  Administered 2024-02-14 – 2024-02-16 (×5): 3 g via INTRAVENOUS
  Filled 2024-02-14 (×5): qty 8

## 2024-02-14 MED ORDER — ONDANSETRON HCL 4 MG PO TABS: 4.0000 mg | ORAL_TABLET | Freq: Four times a day (QID) | ORAL | Status: DC | PRN

## 2024-02-14 MED ORDER — SODIUM CHLORIDE 0.9 % IV SOLN
1.0000 g | Freq: Once | INTRAVENOUS | Status: AC
  Administered 2024-02-14: 1 g via INTRAVENOUS
  Filled 2024-02-14: qty 10

## 2024-02-14 MED ORDER — ROSUVASTATIN CALCIUM 20 MG PO TABS
20.0000 mg | ORAL_TABLET | Freq: Every day | ORAL | Status: DC
  Administered 2024-02-14 – 2024-02-17 (×4): 20 mg via ORAL
  Filled 2024-02-14 (×4): qty 1

## 2024-02-14 MED ORDER — LEVOTHYROXINE SODIUM 75 MCG PO TABS
150.0000 ug | ORAL_TABLET | Freq: Every day | ORAL | Status: DC
  Administered 2024-02-15 – 2024-02-18 (×3): 150 ug via ORAL
  Filled 2024-02-14 (×4): qty 2

## 2024-02-14 MED ORDER — METOPROLOL SUCCINATE ER 25 MG PO TB24
25.0000 mg | ORAL_TABLET | Freq: Two times a day (BID) | ORAL | Status: DC
  Administered 2024-02-14 – 2024-02-17 (×7): 25 mg via ORAL
  Filled 2024-02-14 (×7): qty 1

## 2024-02-14 MED ORDER — DONEPEZIL HCL 5 MG PO TABS
5.0000 mg | ORAL_TABLET | Freq: Every day | ORAL | Status: DC
  Administered 2024-02-15 – 2024-02-16 (×2): 5 mg via ORAL
  Filled 2024-02-14 (×3): qty 1

## 2024-02-14 MED ORDER — OXYCODONE HCL 5 MG PO TABS
5.0000 mg | ORAL_TABLET | ORAL | Status: DC | PRN
  Administered 2024-02-14 – 2024-02-16 (×2): 5 mg via ORAL
  Filled 2024-02-14 (×2): qty 1

## 2024-02-14 MED ORDER — ENOXAPARIN SODIUM 40 MG/0.4ML IJ SOSY
40.0000 mg | PREFILLED_SYRINGE | Freq: Every day | INTRAMUSCULAR | Status: DC
  Administered 2024-02-14 – 2024-02-16 (×3): 40 mg via SUBCUTANEOUS
  Filled 2024-02-14 (×4): qty 0.4

## 2024-02-14 MED ORDER — GADOBUTROL 1 MMOL/ML IV SOLN
7.0000 mL | Freq: Once | INTRAVENOUS | Status: AC | PRN
  Administered 2024-02-14: 7 mL via INTRAVENOUS

## 2024-02-14 MED ORDER — LINEZOLID 600 MG PO TABS
600.0000 mg | ORAL_TABLET | Freq: Two times a day (BID) | ORAL | Status: DC
  Administered 2024-02-14 – 2024-02-17 (×5): 600 mg via ORAL
  Filled 2024-02-14 (×9): qty 1

## 2024-02-14 MED ORDER — ONDANSETRON HCL 4 MG/2ML IJ SOLN
4.0000 mg | Freq: Four times a day (QID) | INTRAMUSCULAR | Status: DC | PRN
  Filled 2024-02-14: qty 2

## 2024-02-14 NOTE — ED Notes (Signed)
 RN called the admitting floor again to call for report. No answer at this time.

## 2024-02-14 NOTE — ED Notes (Signed)
 ED TO INPATIENT HANDOFF REPORT  ED Nurse Name and Phone #: Marilee Showers [phone]  S Name/Age/Gender Tina Hester 83 y.o. female Room/Bed: WA10/WA10  Code Status   Code Status: Full Code  Home/SNF/Other Home Patient oriented to: self, place, time, and situation Is this baseline? Yes   Triage Complete: Triage complete  Chief Complaint Cellulitis of right foot [L03.115]  Triage Note Pt presents today with daughter for wound check on R foot great toe. Per daughter, pt has diabetes and significant vascular disease in legs. Would like this toe checked because the toe is huge and appears infected. Pt has MRI scheduled for toe coming up and has been on doxycycline  for approx 3 days but daughter states no improvement   Allergies Allergies  Allergen Reactions   Pneumococcal Vaccine Anaphylaxis    weakness   Sulfa Antibiotics Hives   Pregabalin Other (See Comments)    Other reaction(s): depression, patient unsure    Level of Care/Admitting Diagnosis ED Disposition     ED Disposition  Admit   Condition  --   Comment  Hospital Area: [facility] [[id number]]  Level of Care: Med-Surg [16]  May admit patient to [facility] or [facility] if equivalent level of care is available:: No  Covid Evaluation: Asymptomatic - no recent exposure (last 10 days) testing not required  Diagnosis: Cellulitis of right foot [478295]  Admitting Physician: Audria Leather [[id number]]  Attending Physician: Audria Leather [[id number]]  Certification:: I certify this patient will need inpatient services for at least 2 midnights  Expected Medical Readiness: 02/17/2024          B Medical/Surgery History Past Medical History:  Diagnosis Date   Anemia    s/p Heme work -up normal EGD and colonscopy in 2012 per pt,neg SPEP   Anxiety    Aortic stenosis    Mild by echo 12/2021 with mean aortic valve gradient 17 mmHg   Bilateral carotid artery stenosis 06/08/2015   1-39% bilateral    Bradycardia    CKD (chronic kidney disease), stage III (HCC)    Diabetes mellitus without complication (HCC)    type 2   GERD (gastroesophageal reflux disease)    Gout    HSV-1 (herpes simplex virus 1) infection    Acyclovir  prn   Hyperkalemia    Hyperlipidemia    Hypertension    Joint pain    osteoarthritis by Xray- possible erosion of R 4th MCP,elevated uric  acid ,ANA +    MCI (mild cognitive impairment)    Morbid obesity (HCC)    OSA on CPAP    Osteopenia    PAF (paroxysmal atrial fibrillation) (HCC)    Pain management    Neurosurg: Dr Gerri Kras   Pulmonary HTN (HCC)    Moderate by echo  2016. PASP   TIA (transient ischemic attack)    remote history of TIA's early 2000   Past Surgical History:  Procedure Laterality Date   ABDOMINAL AORTOGRAM W/LOWER EXTREMITY N/A 11/11/2023   Procedure: ABDOMINAL AORTOGRAM W/LOWER EXTREMITY;  Surgeon: Carlene Che, MD;  [facility]: [facility] INVASIVE CV LAB;  Service: Cardiovascular;  Laterality: N/A;   ABDOMINAL HYSTERECTOMY     APPENDECTOMY     BACK SURGERY     cervical fusion   BLADDER SURGERY     CESAREAN SECTION     CHOLECYSTECTOMY     LEFT HEART CATH AND CORONARY ANGIOGRAPHY N/A 11/10/2023   Procedure: LEFT HEART CATH AND CORONARY ANGIOGRAPHY;  Surgeon: Antoinette Batman  D, MD;  Location: [facility];  Service: Cardiovascular;  Laterality: N/A;   LOWER EXTREMITY ANGIOGRAPHY N/A 02/25/2023   Procedure: Lower Extremity Angiography;  Surgeon: Carlene Che, MD;  Location: [facility] INVASIVE CV LAB;  Service: Cardiovascular;  Laterality: N/A;   LOWER EXTREMITY ANGIOGRAPHY N/A 11/11/2023   Procedure: Lower Extremity Angiography;  Surgeon: Carlene Che, MD;  Location: [facility] INVASIVE CV LAB;  Service: Cardiovascular;  Laterality: N/A;   LOWER EXTREMITY INTERVENTION N/A 11/11/2023   Procedure: LOWER EXTREMITY INTERVENTION;  Surgeon: Carlene Che, MD;  Location: [facility];  Service: Cardiovascular;  Laterality: N/A;    PERIPHERAL VASCULAR INTERVENTION  02/25/2023   Procedure: PERIPHERAL VASCULAR INTERVENTION;  Surgeon: Carlene Che, MD;  Location: [facility] LAB;  Service: Cardiovascular;;     A IV Location/Drains/Wounds Patient Lines/Drains/Airways Status     Active Line/Drains/Airways     Name Placement date Placement time Site Days   Peripheral IV 02/14/24 20 G 1 Left Antecubital 02/14/24  1340  Antecubital  less than 1   Peripheral IV 02/14/24 20 G 1 Anterior;Proximal;Right Forearm 02/14/24  1345  Forearm  less than 1   Pressure Injury 12/31/22 Heel Right Unstageable - Full thickness tissue loss in which the base of the injury is covered by slough (yellow, tan, gray, green or brown) and/or eschar (tan, brown or black) in the wound bed. black, oval shaped 12/31/22  0004  -- 410   Wound / Incision (Open or Dehisced) 11/10/23 Pretibial Left;Posterior 11/10/23  1839  Pretibial  96            Intake/Output Last 24 hours  Intake/Output Summary (Last 24 hours) at 02/14/2024 1542 Last data filed at 02/14/2024 1418 Gross per 24 hour  Intake 100 ml  Output --  Net 100 ml    Labs/Imaging Results for orders placed or performed during the hospital encounter of 02/14/24 (from the past 48 hours)  Sedimentation rate     Status: Abnormal   Collection Time: 02/14/24  1:51 PM  Result Value Ref Range   Sed Rate 41 (H) 0 - 22 mm/hr    Comment: Performed at [facility], [address] [place], [place] [address]  CBC with Differential     Status: Abnormal   Collection Time: 02/14/24  1:51 PM  Result Value Ref Range   WBC 9.0 4.0 - 10.5 K/uL   RBC 3.98 3.87 - 5.11 MIL/uL   Hemoglobin 10.8 (L) 12.0 - 15.0 g/dL   HCT 21.3 (L) 08.6 - 57.8 %   MCV 87.9 80.0 - 100.0 fL   MCH 27.1 26.0 - 34.0 pg   MCHC 30.9 30.0 - 36.0 g/dL   RDW 46.9 (H) 62.9 - 52.8 %   Platelets 188 150 - 400 K/uL   nRBC 0.0 0.0 - 0.2 %   Neutrophils Relative % 66 %   Neutro Abs 5.9 1.7 - 7.7 K/uL    Lymphocytes Relative 20 %   Lymphs Abs 1.8 0.7 - 4.0 K/uL   Monocytes Relative 12 %   Monocytes Absolute 1.1 (H) 0.1 - 1.0 K/uL   Eosinophils Relative 1 %   Eosinophils Absolute 0.1 0.0 - 0.5 K/uL   Basophils Relative 1 %   Basophils Absolute 0.1 0.0 - 0.1 K/uL   Immature Granulocytes 0 %   Abs Immature Granulocytes 0.03 0.00 - 0.07 K/uL    Comment: Performed at [facility], [address] [place], [place] [address]  Basic metabolic panel     Status: Abnormal   Collection Time: 02/14/24  1:51 PM  Result Value Ref Range   Sodium 133 (L) 135 - 145 mmol/L   Potassium 3.8 3.5 - 5.1 mmol/L   Chloride 102 98 - 111 mmol/L   CO2 22 22 - 32 mmol/L   Glucose, Bld 207 (H) 70 - 99 mg/dL    Comment: Glucose reference range applies only to samples taken after fasting for at least 8 hours.   BUN 23 8 - 23 mg/dL   Creatinine, Ser 8.65 (H) 0.44 - 1.00 mg/dL   Calcium  8.4 (L) 8.9 - 10.3 mg/dL   GFR, Estimated 40 (L) >60 mL/min    Comment: (NOTE) Calculated using the CKD-EPI Creatinine Equation (2021)    Anion gap 9 5 - 15    Comment: Performed at [facility], [address] [place], [place] [address]   No results found.  Pending Labs Unresulted Labs (From admission, onward)     Start     Ordered   02/15/24 0500  Comprehensive metabolic panel  Tomorrow morning,   R        02/14/24 1445   02/15/24 0500  CBC  Tomorrow morning,   R        02/14/24 1445   02/15/24 0500  Magnesium  Tomorrow morning,   R        02/14/24 1445   02/15/24 0500  C-reactive protein  Tomorrow morning,   R        02/14/24 1445   02/14/24 1317  Blood culture (routine x 2)  BLOOD CULTURE X 2,   R (with STAT occurrences)      02/14/24 1317   02/14/24 1316  C-reactive protein  Once,   URGENT        02/14/24 1316            Vitals/Pain Today's Vitals   02/14/24 1255 02/14/24 1259 02/14/24 1400  BP: (!) 123/90    Pulse: 68    Resp: 16    Temp: 98.3 F (36.8 C)     TempSrc: Oral    SpO2: 99%    Weight:   78.8 kg  PainSc:  4      Isolation Precautions No active isolations  Medications Medications  vancomycin  (VANCOREADY) IVPB 1500 mg/300 mL (1,500 mg Intravenous New Bag/Given 02/14/24 1438)  insulin  aspart (novoLOG ) injection 0-15 Units (has no administration in time range)  insulin  aspart (novoLOG ) injection 0-5 Units (has no administration in time range)  acetaminophen  (TYLENOL ) tablet 650 mg (has no administration in time range)    Or  acetaminophen  (TYLENOL ) suppository 650 mg (has no administration in time range)  oxyCODONE  (Oxy IR/ROXICODONE ) immediate release tablet 5 mg (has no administration in time range)  morphine (PF) 2 MG/ML injection 2 mg (has no administration in time range)  ondansetron  (ZOFRAN ) tablet 4 mg (has no administration in time range)    Or  ondansetron  (ZOFRAN ) injection 4 mg (has no administration in time range)  senna-docusate (Senokot-S) tablet 1 tablet (has no administration in time range)  donepezil  (ARICEPT ) tablet 5 mg (has no administration in time range)  ezetimibe  (ZETIA ) tablet 10 mg (has no administration in time range)  gabapentin  (NEURONTIN ) capsule 300 mg (has no administration in time range)  levothyroxine  (SYNTHROID ) tablet 150 mcg (has no administration in time range)  metoprolol  succinate (TOPROL -XL) 24 hr tablet 25 mg (has no administration in time range)  irbesartan  (AVAPRO ) tablet  150 mg (has no administration in time range)  rosuvastatin  (CRESTOR ) tablet 20 mg (has no administration in time range)  sertraline  (ZOLOFT ) tablet 50 mg (has no administration in time range)  enoxaparin (LOVENOX) injection 40 mg (has no administration in time range)  linezolid (ZYVOX) tablet 600 mg (has no administration in time range)  Ampicillin-Sulbactam (UNASYN) 3 g in sodium chloride  0.9 % 100 mL IVPB (has no administration in time range)  cefTRIAXone  (ROCEPHIN ) 1 g in sodium chloride  0.9 % 100 mL IVPB (0 g  Intravenous Stopped 02/14/24 1418)    Mobility walks with device     Focused Assessments    R Recommendations: See Admitting Provider Note  Report given to:   Additional Notes:

## 2024-02-14 NOTE — ED Notes (Signed)
 Tried to call report to [place], RN on 5N. She said call back in 15 minutes.

## 2024-02-14 NOTE — H&P (Addendum)
 History and Physical    Tina Hester:[id number] DOB: 02-17-1941 DOA: 02/14/2024  PCP: Olin Bertin, MD   Patient coming from: Home  I have personally briefly reviewed patient's old medical records in [facility]  Chief Complaint: Right great toe wound with increasing pain and redness  HPI: Tina Hester is a 83 y.o. female with medical history significant of diabetes mellitus type 2, hypothyroidism, hypertension, hyperlipidemia, dementia, peripheral arterial disease, bilateral carotid artery stenosis, aortic stenosis, OSA on CPAP, persistent atrial fibrillation/flutter on Eliquis , three-vessel CAD with history of non-STEMI presented with worsening right great toe wound redness, pain.  Patient does not remember why she is in the hospital.  History is obtained from the daughter.  The daughter states that she has had right great toe wound for months now but started getting red, swollen with some drainage for the last week for which she was put on oral doxycycline  since last week.  No fever, chest pain, worsening shortness of breath, abdominal pain, diarrhea, dysuria, loss of consciousness or seizures.  She had an episode of vomiting 2 days ago.  ED Course: Vitals were stable.  WBC of 9.  Creatinine of 1.32.  ED provider spoke to podiatrist/Dr. Standiford who recommended transfer to [facility], started on IV antibiotics and obtain MRI foot and ABI. Hospitalist service was called to evaluate the patient.  Review of Systems: As per HPI otherwise all other systems were reviewed and are negative.   Past Medical History:  Diagnosis Date   Anemia    s/p Heme work -up normal EGD and colonscopy in 2012 per pt,neg SPEP   Anxiety    Aortic stenosis    Mild by echo 12/2021 with mean aortic valve gradient 17 mmHg   Bilateral carotid artery stenosis 06/08/2015   1-39% bilateral   Bradycardia    CKD (chronic kidney disease), stage III (HCC)    Diabetes mellitus without  complication (HCC)    type 2   GERD (gastroesophageal reflux disease)    Gout    HSV-1 (herpes simplex virus 1) infection    Acyclovir  prn   Hyperkalemia    Hyperlipidemia    Hypertension    Joint pain    osteoarthritis by Xray- possible erosion of R 4th MCP,elevated uric  acid ,ANA +    MCI (mild cognitive impairment)    Morbid obesity (HCC)    OSA on CPAP    Osteopenia    PAF (paroxysmal atrial fibrillation) (HCC)    Pain management    Neurosurg: Dr Gerri Kras   Pulmonary HTN (HCC)    Moderate by echo  2016. PASP   TIA (transient ischemic attack)    remote history of TIA's early 2000    Past Surgical History:  Procedure Laterality Date   ABDOMINAL AORTOGRAM W/LOWER EXTREMITY N/A 11/11/2023   Procedure: ABDOMINAL AORTOGRAM W/LOWER EXTREMITY;  Surgeon: Carlene Che, MD;  [facility]: [facility] INVAS[facility] CV LAB;  Service: Cardiovascular;  Laterality: N/A;   ABDOMINAL HYSTERECTOMY     APPENDECTOMY     BACK SURGERY     cervical fusion   BLADDER SURGERY     CESAREAN SECTION     CHOLECYSTECTOMY     LEFT HEART CATH AND CORONARY ANGIOGRAPHY N/A 11/10/2023   Procedure: LEFT HEART CATH AND CORONARY ANGIOGRAPHY;  Surgeon: Odie Benne, MD;  [facility]: [facility];  Service: Cardiovascular;  Laterality: N/A;   LOWER EXTREMITY ANGIOGRAPHY N/A 02/25/2023   Procedure: Lower Extremity Angiography;  Surgeon: Carlene Che, MD;  Location: [facility] INVASIVE CV LAB;  Service: Cardiovascular;  Laterality: N/A;   LOWER EXTREMITY ANGIOGRAPHY N/A 11/11/2023   Procedure: Lower Extremity Angiography;  Surgeon: Carlene Che, MD;  Location: [facility] INVASIVE CV LAB;  Service: Cardiovascular;  Laterality: N/A;   LOWER EXTREMITY INTERVENTION N/A 11/11/2023   Procedure: LOWER EXTREMITY INTERVENTION;  Surgeon: Carlene Che, MD;  Location: [facility]VASIVE CV LAB;  Service: Cardiovascular;  Laterality: N/A;   PERIPHERAL VASCULAR INTERVENTION  02/25/2023   Procedure: PERIPHERAL VASCULAR  INTERVENTION;  Surgeon: Carlene Che, MD;  Location: [facility]VASIVE CV LAB;  Service: Cardiovascular;;     reports that she has never smoked. She has never used smokeless tobacco. She reports that she does not drink alcohol and does not use drugs.  Allergies  Allergen Reactions   Pneumococcal Vaccine Anaphylaxis    weakness   Sulfa Antibiotics Hives   Pregabalin Other (See Comments)    Other reaction(s): depression, patient unsure    Family History  Problem Relation Age of Onset   Arrhythmia Brother     Prior to Admission medications   Medication Sig Start Date End Date Taking? Authorizing Provider  acyclovir  (ZOVIRAX ) 400 MG tablet Take 400 mg by mouth. 02/08/24 04/08/24 Yes [provider]  prednisoLONE  acetate (PRED FORTE ) 1 % ophthalmic suspension Apply 1 drop to eye. 02/08/24 02/06/2024 Yes [provider]  acetaminophen  (TYLENOL ) 325 MG tablet Take 650 mg by mouth every 4 (four) hours as needed for mild pain (pain score 1-3) or moderate pain (pain score 4-6).    [provider]  amoxicillin-clavulanate (AUGMENTIN) 250-62.5 MG/5ML suspension Take 10 mLs by mouth 2 (two) times daily. Patient not taking: Reported on 02/07/2024 01/21/24   [provider]  CALMOSEPTINE 0.44-20.6 % OINT Apply 1 Application topically daily as needed (Skin irritation). 10/26/23   [provider]  clopidogrel  (PLAVIX ) 75 MG tablet Take 75 mg by mouth daily. 01/20/24   [provider]  donepezil  (ARICEPT ) 5 MG tablet Take 5 mg by mouth daily. 12/17/22   [provider]  doxycycline  (VIBRAMYCIN ) 100 MG capsule Take 100 mg by mouth 2 (two) times daily. 02/06/24   [provider]  ELIQUIS  5 MG TABS tablet TAKE 1 TABLET(5 MG) BY MOUTH TWICE DAILY 01/02/24   Jacqueline Matsu, MD  empagliflozin  (JARDIANCE ) 10 MG TABS tablet Take 10 mg by mouth daily.    [provider]  ezetimibe  (ZETIA ) 10 MG tablet Take 10 mg by mouth every evening. 12/17/22    [provider]  furosemide  (LASIX ) 20 MG tablet Take 20 mg by mouth as needed for fluid or edema. 01/20/24   [provider]  gabapentin  (NEURONTIN ) 300 MG capsule Take 1 capsule (300 mg total) by mouth at bedtime. 11/17/23   Audria Leather, MD  HYDROcodone -acetaminophen  (NORCO/VICODIN) 5-325 MG tablet Take 1 tablet by mouth every 6 (six) hours as needed for moderate pain (pain score 4-6). Patient not taking: Reported on 02/07/2024 11/08/23   Carlene Che, MD  insulin  lispro (HUMALOG ) 100 UNIT/ML injection Inject 1-7 Units into the skin 3 (three) times daily before meals. Sliding scale 141-180=1 unit,181-220= 2 units,221-260= 3 units,261-300=4 units, 301-340=5 units, 341-380=6 units, 381-400 = 7 units,> 400 call MD    [provider]  Insulin  NPH, Human,, Isophane, (NOVOLIN  N FLEXPEN) 100 UNIT/ML Kiwkpen Inject 15 units at morning and 18 units at night Patient taking differently: Inject 14-18 Units into the skin See admin instructions. Inject  14 units at morning and 18 units at night 09/24/21   Lajean Pike, MD  levothyroxine  (SYNTHROID ) 150 MCG tablet Take 150 mcg by mouth daily before breakfast. 04/13/23   [provider]  metoprolol  succinate (TOPROL -XL) 25 MG 24 hr tablet Take 1 tablet (25 mg total) by mouth 2 (two) times daily. 01/27/24   Jacqueline Matsu, MD  olmesartan (BENICAR) 20 MG tablet Take 20 mg by mouth daily. 01/11/24   [provider]  omeprazole (PRILOSEC) 20 MG capsule Take 20 mg by mouth daily as needed (acid reflux). 04/13/23   [provider]  oxyCODONE -acetaminophen  (PERCOCET/ROXICET) 5-325 MG tablet Take 1 tablet by mouth every 4 (four) hours as needed for severe pain (pain score 7-10). 12/07/23   Carlene Che, MD  OZEMPIC , 0.25 OR 0.5 MG/DOSE, 2 MG/3ML SOPN Inject 0.25 mg into the skin once a week. Sundays 03/14/23   [provider]  polyethylene glycol (MIRALAX  / GLYCOLAX ) 17 g packet Take 17 g by mouth daily as needed.  11/17/23   Audria Leather, MD  rosuvastatin  (CRESTOR ) 20 MG tablet Take 1 tablet (20 mg total) by mouth daily. 11/18/23   Audria Leather, MD  SANTYL 250 UNIT/GM ointment Apply 1 Application topically as needed.    [provider]  sertraline  (ZOLOFT ) 50 MG tablet Take 50 mg by mouth daily. 12/17/22   [provider]    Physical Exam: Vitals:   02/14/24 1255  BP: (!) 123/90  Pulse: 68  Resp: 16  Temp: 98.3 F (36.8 C)  TempSrc: Oral  SpO2: 99%    Constitutional: NAD, calm, comfortable.  Looks chronically ill and deconditioned.  Elderly female lying in bed. Vitals:   02/14/24 1255  BP: (!) 123/90  Pulse: 68  Resp: 16  Temp: 98.3 F (36.8 C)  TempSrc: Oral  SpO2: 99%   Eyes: PERRL, lids and conjunctivae normal ENMT: Mucous membranes are moist. Posterior pharynx clear of any exudate or lesions. Neck: normal, supple, no masses, no thyromegaly Respiratory: bilateral decreased breath sounds at bases, no wheezing, no crackles. Normal respiratory effort. No accessory muscle use.  Cardiovascular: S1 S2 positive, rate controlled.  Trace lower extremity edema. 2+ pedal pulses.  Abdomen: no tenderness, no masses palpated. No hepatosplenomegaly. Bowel sounds positive.  Musculoskeletal: no clubbing / cyanosis. No joint deformity upper and lower extremities.  Skin: Right great toe erythema and swelling with open wound on the dorsum of her right great toe with minimal drainage noted with erythema extending up to the foot with some mild tenderness around. Neurologic: Awake, answers some questions, slightly confused to time.  CN 2-12 grossly intact. Moving extremities. No focal neurologic deficits.  Psychiatric: Affect is flat.  Not agitated.   Labs on Admission: I have personally reviewed following labs and imaging studies  CBC: Recent Labs  Lab 02/14/24 1351  WBC 9.0  NEUTROABS 5.9  HGB 10.8*  HCT 35.0*  MCV 87.9  PLT 188   Basic Metabolic Panel: Recent Labs  Lab  02/14/24 1351  NA 133*  K 3.8  CL 102  CO2 22  GLUCOSE 207*  BUN 23  CREATININE 1.32*  CALCIUM  8.4*   GFR: CrCl cannot be calculated (Unknown ideal weight.). Liver Function Tests: No results for input(s): AST, ALT, ALKPHOS, BILITOT, PROT, ALBUMIN in the last 168 hours. No results for input(s): LIPASE, AMYLASE in the last 168 hours. No results for input(s): AMMONIA in the last 168 hours. Coagulation Profile: No results for input(s): INR, PROTIME in the  last 168 hours. Cardiac Enzymes: No results for input(s): CKTOTAL, CKMB, CKMBINDEX, TROPONINI in the last 168 hours. BNP (last 3 results) No results for input(s): PROBNP in the last 8760 hours. HbA1C: No results for input(s): HGBA1C in the last 72 hours. CBG: No results for input(s): GLUCAP in the last 168 hours. Lipid Profile: No results for input(s): CHOL, HDL, LDLCALC, TRIG, CHOLHDL, LDLDIRECT in the last 72 hours. Thyroid  Function Tests: No results for input(s): TSH, T4TOTAL, FREET4, T3FREE, THYROIDAB in the last 72 hours. Anemia Panel: No results for input(s): VITAMINB12, FOLATE, FERRITIN, TIBC, IRON, RETICCTPCT in the last 72 hours. Urine analysis:    Component Value Date/Time   COLORURINE STRAW (A) 11/10/2023 0522   APPEARANCEUR HAZY (A) 11/10/2023 0522   LABSPEC 1.014 11/10/2023 0522   PHURINE 5.0 11/10/2023 0522   GLUCOSEU >=500 (A) 11/10/2023 0522   GLUCOSEU NEGATIVE 12/13/2018 1100   HGBUR MODERATE (A) 11/10/2023 0522   BILIRUBINUR NEGATIVE 11/10/2023 0522   KETONESUR NEGATIVE 11/10/2023 0522   PROTEINUR NEGATIVE 11/10/2023 0522   UROBILINOGEN 0.2 12/13/2018 1100   NITRITE NEGATIVE 11/10/2023 0522   LEUKOCYTESUR LARGE (A) 11/10/2023 0522    Radiological Exams on Admission: No results found.  Assessment/Plan  Diabetic right great toe wound infection with cellulitis and concern for osteomyelitis - Patient has had right great toe wound  for few months now with increasing redness, swelling and drainage for the last week despite being on oral doxycycline  for the last week. -ED provider spoke to podiatrist/Dr. Standiford who recommended transfer to [facility], start on IV antibiotics and obtain MRI foot and ABI. - IV antibiotics: Unasyn and Zyvox - N.p.o. past midnight in case patient needs surgical intervention  Diabetes mellitus type 2 with hyperglycemia - CBGs with SSI.  Carb modified diet  Persistent atrial fibrillation/flutter - Rate controlled.  Hold Eliquis  in case patient needs surgical intervention.  CKD stage IIIb - Creatinine stable.  Monitor  Anemia of chronic disease -From chronic kidney disease.  Hemoglobin stable.  Monitor intermittently.  Hypertension hyperlipidemia - Continue statin, ezetimibe , beta-blocker and ARB  Dementia - Fall precautions.  Monitor mental status.  History of three-vessel CAD - No chest pain.  Continue beta-blocker.  Hold Plavix   Hypothyroidism - Continue Synthroid   Goals of care - Consult palliative care for goals of care discussion.  Currently full code.    DVT prophylaxis: Lovenox Code Status: Full Family Communication: Daughter at bedside Disposition Plan: Pending clinical improvement Consults called: Podiatry/Dr. Standiford Admission status: Inpatient/MedSurg  Severity of Illness: The appropriate patient status for this patient is INPATIENT. Inpatient status is judged to be reasonable and necessary in order to provide the required intensity of service to ensure the patient's safety. The patient's presenting symptoms, physical exam findings, and initial radiographic and laboratory data in the context of their chronic comorbidities is felt to place them at high risk for further clinical deterioration. Furthermore, it is not anticipated that the patient will be medically stable for discharge from the hospital within 2 midnights of admission.   * I certify  that at the point of admission it is my clinical judgment that the patient will require inpatient hospital care spanning beyond 2 midnights from the point of admission due to high intensity of service, high risk for further deterioration and high frequency of surveillance required.Audria Leather MD [facility]  02/14/2024, 2:46 PM

## 2024-02-14 NOTE — ED Notes (Signed)
 [facility] called.

## 2024-02-14 NOTE — ED Triage Notes (Signed)
 Pt presents today with daughter for wound check on R foot great toe. Per daughter, pt has diabetes and significant vascular disease in legs. Would like this toe checked because the toe is huge and appears infected. Pt has MRI scheduled for toe coming up and has been on doxycycline  for approx 3 days but daughter states no improvement

## 2024-02-14 NOTE — ED Provider Notes (Signed)
 [place] EMERGENCY DEPARTMENT AT [facility] Provider Note   CSN: [id number] Arrival date & time: 02/14/24  1246     Patient presents with: Wound Check   Tina Hester is a 83 y.o. female.   83 year old female with a history of PAD, dementia, chronic wounds, and diabetes who presents to the emergency department with right great toe pain, redness, and ulceration.  History obtained per the patient and her daughter.  She states that for approximately the past month has had a wound on her right great toe.  The toe has started to get very red and swollen in the past few days.  Also started to have some drainage from it.  Chills but no fevers but she thinks this may be her baseline.  Has already been on doxycycline  for several days but the redness appears to be worsening.       Prior to Admission medications   Medication Sig Start Date End Date Taking? Authorizing Provider  acyclovir  (ZOVIRAX ) 400 MG tablet Take 400 mg by mouth. 02/08/24 04/08/24 Yes [provider]  olmesartan (BENICAR) 20 MG tablet  01/11/24  Yes [provider]  prednisoLONE  acetate (PRED FORTE ) 1 % ophthalmic suspension Apply 1 drop to eye. 02/08/24 02/15/2024 Yes [provider]  acetaminophen  (TYLENOL ) 325 MG tablet Take 650 mg by mouth every 4 (four) hours as needed for mild pain (pain score 1-3) or moderate pain (pain score 4-6).    [provider]  amoxicillin-clavulanate (AUGMENTIN) 250-62.5 MG/5ML suspension Take 10 mLs by mouth 2 (two) times daily. Patient not taking: Reported on 02/07/2024 01/21/24   [provider]  CALMOSEPTINE 0.44-20.6 % OINT Apply 1 Application topically daily as needed (Skin irritation). 10/26/23   [provider]  clopidogrel  (PLAVIX ) 75 MG tablet Take 75 mg by mouth daily. 01/20/24   [provider]  donepezil  (ARICEPT ) 5 MG tablet Take 5 mg by mouth daily. 12/17/22   [provider]  doxycycline  (VIBRAMYCIN )  100 MG capsule Take 100 mg by mouth 2 (two) times daily. 02/06/24   [provider]  ELIQUIS  5 MG TABS tablet TAKE 1 TABLET(5 MG) BY MOUTH TWICE DAILY 01/02/24   Jacqueline Matsu, MD  empagliflozin  (JARDIANCE ) 10 MG TABS tablet Take 10 mg by mouth daily.    [provider]  ezetimibe  (ZETIA ) 10 MG tablet Take 10 mg by mouth every evening. 12/17/22   [provider]  furosemide  (LASIX ) 20 MG tablet Take 20 mg by mouth as needed for fluid or edema. 01/20/24   [provider]  gabapentin  (NEURONTIN ) 300 MG capsule Take 1 capsule (300 mg total) by mouth at bedtime. 11/17/23   Audria Leather, MD  HYDROcodone -acetaminophen  (NORCO/VICODIN) 5-325 MG tablet Take 1 tablet by mouth every 6 (six) hours as needed for moderate pain (pain score 4-6). Patient not taking: Reported on 02/07/2024 11/08/23   Carlene Che, MD  insulin  lispro (HUMALOG ) 100 UNIT/ML injection Inject 1-7 Units into the skin 3 (three) times daily before meals. Sliding scale 141-180=1 unit,181-220= 2 units,221-260= 3 units,261-300=4 units, 301-340=5 units, 341-380=6 units, 381-400 = 7 units,> 400 call MD    [provider]  Insulin  NPH, Human,, Isophane, (NOVOLIN  N FLEXPEN) 100 UNIT/ML Kiwkpen Inject 15 units at morning and 18 units at night Patient taking differently: Inject 14-18 Units into the skin See admin instructions. Inject 14 units at morning and 18 units at night 09/24/21   Lajean Pike, MD  levothyroxine  (SYNTHROID ) 150 MCG tablet  Take 150 mcg by mouth daily before breakfast. 04/13/23   [provider]  metoprolol  succinate (TOPROL -XL) 25 MG 24 hr tablet Take 1 tablet (25 mg total) by mouth 2 (two) times daily. 01/27/24   Jacqueline Matsu, MD  omeprazole (PRILOSEC) 20 MG capsule Take 20 mg by mouth daily as needed (acid reflux). 04/13/23   [provider]  oxyCODONE -acetaminophen  (PERCOCET/ROXICET) 5-325 MG tablet Take 1 tablet by mouth every 4 (four) hours as needed for severe pain  (pain score 7-10). 12/07/23   Carlene Che, MD  OZEMPIC , 0.25 OR 0.5 MG/DOSE, 2 MG/3ML SOPN Inject 0.25 mg into the skin once a week. Sundays 03/14/23   [provider]  polyethylene glycol (MIRALAX  / GLYCOLAX ) 17 g packet Take 17 g by mouth daily as needed. 11/17/23   Audria Leather, MD  rosuvastatin  (CRESTOR ) 20 MG tablet Take 1 tablet (20 mg total) by mouth daily. 11/18/23   Audria Leather, MD  SANTYL 250 UNIT/GM ointment Apply 1 Application topically as needed.    [provider]  sertraline  (ZOLOFT ) 50 MG tablet Take 50 mg by mouth daily. 12/17/22   [provider]    Allergies: Pneumococcal vaccine, Sulfa antibiotics, and Pregabalin    Review of Systems  Updated Vital Signs BP (!) 123/90 (BP Location: Left Arm)   Pulse 68   Temp 98.3 F (36.8 C) (Oral)   Resp 16   SpO2 99%   Physical Exam Vitals and nursing note reviewed.  Constitutional:      General: She is not in acute distress.    Appearance: She is well-developed.  HENT:     Head: Normocephalic and atraumatic.     Right Ear: External ear normal.     Left Ear: External ear normal.     Nose: Nose normal.   Eyes:     Extraocular Movements: Extraocular movements intact.     Conjunctiva/sclera: Conjunctivae normal.     Pupils: Pupils are equal, round, and reactive to light.   Pulmonary:     Effort: Pulmonary effort is normal. No respiratory distress.   Musculoskeletal:     Right lower leg: No edema.     Left lower leg: No edema.     Comments: Erythema and swelling of the right great toe.  Cap refill less than 2 seconds.  5 mm open wound on the dorsum of her right great toe with small amount of drainage noted.  Erythema does not extend significantly up the foot she does not have significant tenderness to palpation past the edges of the erythema.  DP pulse 2+ in the right foot.   Left calf ulceration noted.  This is chronic.  No surrounding erythema.  Scant amount of serous discharge.    Skin:    General: Skin is warm and dry.   Neurological:     Mental Status: She is alert. Mental status is at baseline.   Psychiatric:        Mood and Affect: Mood normal.     (all labs ordered are listed, but only abnormal results are displayed) Labs Reviewed  CBC WITH DIFFERENTIAL/PLATELET - Abnormal; Notable for the following components:      Result Value   Hemoglobin 10.8 (*)    HCT 35.0 (*)    RDW 16.0 (*)    Monocytes Absolute 1.1 (*)    All other components within normal limits  BASIC METABOLIC PANEL WITH GFR - Abnormal; Notable for the following components:   Sodium 133 (*)  Glucose, Bld 207 (*)    Creatinine, Ser 1.32 (*)    Calcium  8.4 (*)    GFR, Estimated 40 (*)    All other components within normal limits  CULTURE, BLOOD (ROUTINE X 2)  CULTURE, BLOOD (ROUTINE X 2)  SEDIMENTATION RATE  C-REACTIVE PROTEIN    EKG: None  Radiology: No results found.   Procedures   Medications Ordered in the ED  vancomycin  (VANCOREADY) IVPB 1500 mg/300 mL (1,500 mg Intravenous New Bag/Given 02/14/24 1438)  cefTRIAXone  (ROCEPHIN ) 1 g in sodium chloride  0.9 % 100 mL IVPB (0 g Intravenous Stopped 02/14/24 1418)    Clinical Course as of 02/14/24 1439  Tue Feb 14, 2024  1334 Dr Demetria Finch from podiatry consulted. Recommends admission to [place]. Recommends MRI toes, updated ABIs, and vascular surgery consultation.  [RP]  1431 Creatinine(!): 1.32 At baseline [RP]    Clinical Course User Index [RP] Ninetta Basket, MD                                 Medical Decision Making Amount and/or Complexity of Data Reviewed Labs: ordered. Decision-making details documented in ED Course. Radiology: ordered.  Risk Prescription drug management. Decision regarding hospitalization.   83 year old female with a history of PAD, dementia, chronic wounds, and diabetes who presents to the emergency department with right great toe pain, redness, and ulceration.  Initial Ddx:   Diabetic foot wound, osteomyelitis, sepsis, necrotizing fasciitis, peripheral vascular disease, gout  MDM/Course:  Patient presents emergency department with pain and redness and swelling of her right great toe.  Also does have an ulcerated lesion there as well.  Still is a palpable pulse in that foot and has good cap refill of her toe.  No signs of necrotizing fasciitis on exam.  I suspect that she likely has cellulitis of her foot.  Unfortunately it has been refractory to doxycycline  so we will start her on IV antibiotics and admit her to the hospital.  Was discussed with podiatry who recommends also obtaining ABIs and consulting vascular afterwards.  They also recommend transfer to [facility].  Admitted to hospitalist for further management.  Does have an MRI of the foot that is pending to evaluate for osteomyelitis.  This patient presents to the ED for concern of complaints listed in HPI, this involves an extensive number of treatment options, and is a complaint that carries with it a high risk of complications and morbidity. Disposition including potential need for admission considered.   Dispo: Admit to Floor  Additional history obtained from daughter Records reviewed Outpatient Clinic Notes The following labs were independently interpreted: Chemistry and show CKD I have reviewed the patients home medications and made adjustments as needed Consults: podiatry Social Determinants of health:  Geriatric  Portions of this note were generated with Scientist, clinical (histocompatibility and immunogenetics). Dictation errors may occur despite best attempts at proofreading.     Final diagnoses:  Cellulitis of toe of right foot    ED Discharge Orders     None          Ninetta Basket, MD 02/14/24 1439

## 2024-02-15 ENCOUNTER — Inpatient Hospital Stay (HOSPITAL_COMMUNITY)

## 2024-02-15 DIAGNOSIS — I252 Old myocardial infarction: Secondary | ICD-10-CM

## 2024-02-15 DIAGNOSIS — E11621 Type 2 diabetes mellitus with foot ulcer: Secondary | ICD-10-CM

## 2024-02-15 DIAGNOSIS — M869 Osteomyelitis, unspecified: Secondary | ICD-10-CM

## 2024-02-15 DIAGNOSIS — I70269 Atherosclerosis of native arteries of extremities with gangrene, unspecified extremity: Secondary | ICD-10-CM | POA: Diagnosis not present

## 2024-02-15 DIAGNOSIS — Z66 Do not resuscitate: Secondary | ICD-10-CM

## 2024-02-15 DIAGNOSIS — M62261 Nontraumatic ischemic infarction of muscle, right lower leg: Secondary | ICD-10-CM

## 2024-02-15 DIAGNOSIS — L97519 Non-pressure chronic ulcer of other part of right foot with unspecified severity: Secondary | ICD-10-CM

## 2024-02-15 DIAGNOSIS — L97229 Non-pressure chronic ulcer of left calf with unspecified severity: Secondary | ICD-10-CM

## 2024-02-15 DIAGNOSIS — I70242 Atherosclerosis of native arteries of left leg with ulceration of calf: Secondary | ICD-10-CM

## 2024-02-15 DIAGNOSIS — Z789 Other specified health status: Secondary | ICD-10-CM | POA: Diagnosis not present

## 2024-02-15 DIAGNOSIS — I70235 Atherosclerosis of native arteries of right leg with ulceration of other part of foot: Secondary | ICD-10-CM

## 2024-02-15 DIAGNOSIS — E1159 Type 2 diabetes mellitus with other circulatory complications: Secondary | ICD-10-CM

## 2024-02-15 DIAGNOSIS — Z7189 Other specified counseling: Secondary | ICD-10-CM | POA: Diagnosis not present

## 2024-02-15 DIAGNOSIS — E1151 Type 2 diabetes mellitus with diabetic peripheral angiopathy without gangrene: Secondary | ICD-10-CM

## 2024-02-15 DIAGNOSIS — E785 Hyperlipidemia, unspecified: Secondary | ICD-10-CM

## 2024-02-15 DIAGNOSIS — I10 Essential (primary) hypertension: Secondary | ICD-10-CM

## 2024-02-15 DIAGNOSIS — I2510 Atherosclerotic heart disease of native coronary artery without angina pectoris: Secondary | ICD-10-CM

## 2024-02-15 DIAGNOSIS — Z515 Encounter for palliative care: Secondary | ICD-10-CM

## 2024-02-15 DIAGNOSIS — F0390 Unspecified dementia without behavioral disturbance: Secondary | ICD-10-CM

## 2024-02-15 LAB — GLUCOSE, CAPILLARY
Glucose-Capillary: 140 mg/dL — ABNORMAL HIGH (ref 70–99)
Glucose-Capillary: 94 mg/dL (ref 70–99)
Glucose-Capillary: 92 mg/dL (ref 70–99)
Glucose-Capillary: 125 mg/dL — ABNORMAL HIGH (ref 70–99)
Glucose-Capillary: 225 mg/dL — ABNORMAL HIGH (ref 70–99)
Glucose-Capillary: 115 mg/dL — ABNORMAL HIGH (ref 70–99)

## 2024-02-15 LAB — CBC
HCT: 33.6 % — ABNORMAL LOW (ref 36.0–46.0)
Hemoglobin: 10.3 g/dL — ABNORMAL LOW (ref 12.0–15.0)
MCH: 26.4 pg (ref 26.0–34.0)
MCHC: 30.7 g/dL (ref 30.0–36.0)
MCV: 86.2 fL (ref 80.0–100.0)
Platelets: 175 10*3/uL (ref 150–400)
RBC: 3.9 MIL/uL (ref 3.87–5.11)
RDW: 16 % — ABNORMAL HIGH (ref 11.5–15.5)
WBC: 8.8 10*3/uL (ref 4.0–10.5)
nRBC: 0 % (ref 0.0–0.2)

## 2024-02-15 LAB — COMPREHENSIVE METABOLIC PANEL WITH GFR
ALT: 29 U/L (ref 0–44)
AST: 43 U/L — ABNORMAL HIGH (ref 15–41)
Albumin: 2.4 g/dL — ABNORMAL LOW (ref 3.5–5.0)
Alkaline Phosphatase: 71 U/L (ref 38–126)
Anion gap: 10 (ref 5–15)
BUN: 19 mg/dL (ref 8–23)
CO2: 18 mmol/L — ABNORMAL LOW (ref 22–32)
Calcium: 8.1 mg/dL — ABNORMAL LOW (ref 8.9–10.3)
Chloride: 109 mmol/L (ref 98–111)
Creatinine, Ser: 1.32 mg/dL — ABNORMAL HIGH (ref 0.44–1.00)
GFR, Estimated: 40 mL/min — ABNORMAL LOW (ref >60)
Glucose, Bld: 100 mg/dL — ABNORMAL HIGH (ref 70–99)
Potassium: 4.3 mmol/L (ref 3.5–5.1)
Sodium: 137 mmol/L (ref 135–145)
Total Bilirubin: 0.8 mg/dL (ref 0.0–1.2)
Total Protein: 5.9 g/dL — ABNORMAL LOW (ref 6.5–8.1)

## 2024-02-15 LAB — C-REACTIVE PROTEIN: CRP: 3 mg/dL — ABNORMAL HIGH (ref <1.0)

## 2024-02-15 LAB — MAGNESIUM: Magnesium: 1.9 mg/dL (ref 1.7–2.4)

## 2024-02-15 MED ORDER — GUAIFENESIN 100 MG/5ML PO LIQD: 5.0000 mL | ORAL | Status: DC | PRN

## 2024-02-15 MED ORDER — COLLAGENASE 250 UNIT/GM EX OINT
TOPICAL_OINTMENT | Freq: Every day | CUTANEOUS | Status: DC
  Filled 2024-02-15: qty 30

## 2024-02-15 MED ORDER — TRAZODONE HCL 50 MG PO TABS: 50.0000 mg | ORAL_TABLET | Freq: Every evening | ORAL | Status: DC | PRN

## 2024-02-15 MED ORDER — IPRATROPIUM-ALBUTEROL 0.5-2.5 (3) MG/3ML IN SOLN: 3.0000 mL | RESPIRATORY_TRACT | Status: DC | PRN

## 2024-02-15 MED ORDER — HYDRALAZINE HCL 20 MG/ML IJ SOLN: 10.0000 mg | INTRAMUSCULAR | Status: DC | PRN

## 2024-02-15 MED ORDER — METOPROLOL TARTRATE 5 MG/5ML IV SOLN: 5.0000 mg | INTRAVENOUS | Status: DC | PRN

## 2024-02-15 MED ORDER — GLUCAGON HCL RDNA (DIAGNOSTIC) 1 MG IJ SOLR: 1.0000 mg | INTRAMUSCULAR | Status: DC | PRN

## 2024-02-15 NOTE — Consult Note (Addendum)
 Consultation Note Date: 02/15/2024   Patient Name: Tina Hester  DOB: 08/26/1941  MRN: [id number]  Age / Sex: 83 y.o., female  PCP: Olin Bertin, MD Referring Physician: Maggie Schooner, MD  Reason for Consultation: Establishing goals of care  HPI/Patient Profile: 83 y.o. female  with past medical history of diabetes mellitus type 2, hypothyroidism, hypertension, hyperlipidemia, dementia, peripheral arterial disease, bilateral carotid artery stenosis, aortic stenosis, OSA on CPAP, persistent atrial fibrillation/flutter on Eliquis , three-vessel CAD with history of non-STEMI admitted on 02/14/2024 with right great toe osteomyelitis.   Evaluated by podiatry and vascular surgery. Plan is to under go right great toe amputation pending vasc imaging (appears she has angiogram planned later this week).   Today, patient states she is doing ok. She denies pain, SOB, or bothersome symptoms at present. She does report right great toe pain to touch and when she tries to weight bear.   PMT has been consulted to assist with goals of care conversation given her significant comorbidities and risk for declines.   Clinical Assessment and Goals of Care:  I have reviewed medical records including EPIC notes, labs (independently reviewed), prior specialist notes, imaging, and vitals. I assessed the patient and then met with daughter Hermina Loosen) and patient to discuss diagnosis prognosis, GOC, EOL wishes, disposition and options.  I introduced Palliative Medicine as specialized medical care for people living with serious illness. It focuses on providing relief from the symptoms and stress of a serious illness. The goal is to improve quality of life for both the patient and the family.  We discussed a brief life review of the patient and then focused on their current illness.   I attempted to elicit values and goals of care important to the patient.    Medical History  Review and Family/Patient Understanding:   Patient has limited insight into her condition due to her dementia. She feels she is doing pretty good. She has limited recall regarding her cardiac and other conditions but with reminders she is able to recall that we are concerned about her heart and that she has some other health problems. Daughter has a good understanding of patient's current health status.   Social History:  Lives at home with daughter  Functional and Nutritional State:  Prior to this admission pt was wheelchair bound. Able to transfer and ambulate a few steps with assistance/assistive device. Appetite was stable until recently when she started taking Ozempic  for her elevated A1C. Appetite since has been reduced since she started taking this medication.   Palliative Symptoms:  Pain  Advance Directives: A detailed discussion regarding advanced directives was had. See below for more details.    Code Status: DNR/DNI, ok to intubate for procedures, ok to use non-invasive ventilation for respiratory support  Discussion: Extensive discussion with patient and daughter. Discussed while we are hopeful that patient will tolerate procedure well and will return to her baseline we are concerned that with this procedure in the context of her co morbidities as well as her overall risks we are worried that her health could decline. GOC/value/preferences were evaluated. She states she values spending time with family and maintaining her current level of function. For her quality of life means being able to spend time with family, do activities she enjoys (crafting/coloring/watching movies), and not being a burden to her family.   She states a life where she would be bed bound, dependent on others for care, or dependent on machines is not a QOL she  would find acceptable. We discussed that given these values that considering a DNR/DNI would be appropriate. Educated her on the data regarding poor  outcomes associated with CPR in the setting of her chronic illnesses (dementia, inoperable CAD, hx of TIA, aortic stenosis, etc). She states that she is willing to undergo surgery and numerous interventions to return her to her current level of function. She would be willing to undergo intubation for surgery however, she would not want to undergo CPR/intubation for resuscitation and states if she was already gone to let her go on to her next phase. She also shares if she had a serious health event such as a CVA/MI and likelihood of recovery was poor she also not want aggressive life prolonging measures. Daughter at bedside and in agreement with honoring patients wishes.   I completed a MOST form today. The patient and family outlined their wishes for the following treatment decisions:  Cardiopulmonary Resuscitation: Do Not Attempt Resuscitation (DNR/No CPR)  Medical Interventions: Limited Additional Interventions: Use medical treatment, IV fluids and cardiac monitoring as indicated, DO NOT USE intubation or mechanical ventilation. May consider use of less invasive airway support such as BiPAP or CPAP. Also provide comfort measures. Transfer to the hospital if indicated. Avoid intensive care.   Antibiotics: Determine use of limitation of antibiotics when infection occurs  IV Fluids: IV fluids for a defined trial period  Feeding Tube: Feeding tube for a defined trial period    The difference between aggressive medical intervention and comfort care was considered in light of the patient's goals of care. She is followed by palliative care as an outpatient.   Discussed the importance of continued conversation with family and the medical providers regarding overall plan of care and treatment options, ensuring decisions are within the context of the patient's values and GOCs.   Questions and concerns were addressed.  The family was encouraged to call with questions or concerns.  PMT will continue to support  holistically.   PATIENT if unable to speak for herself then daughter     SUMMARY OF RECOMMENDATIONS    Code status changed to reflect patient's current healthcare wishes which includes DNR/DNI, ok to intubate if required for surgical intervention or procedure. Ok for non-invasive ventilator support for respiratory distress. MOST/golden rod updated. She is hopeful to be treated for right great toe osteomyelitis with amputation, receive PT/OT, and return home with family. Recommend proceeding with current workup/medical POC and close monitoring and re-evaluation of GOC for any notable declines.    Code Status/Advance Care Planning: DNR   Symptom Management:  Continue Oxycodone  PRN pain Pain management per primary team with PMT available for support   Prognosis:  Unable to determine  Discharge Planning: To Be Determined      Primary Diagnoses: Present on Admission:  Cellulitis of right foot    Physical Exam Constitutional:      Appearance: Normal appearance. She is not toxic-appearing.  Pulmonary:     Effort: Pulmonary effort is normal. No respiratory distress.   Skin:    General: Skin is warm and dry.     Comments: Right great toe swollen, erythematous, tender to touch with scabbed wound present   Neurological:     Mental Status: She is alert.   Psychiatric:        Mood and Affect: Mood normal.     Vital Signs: BP (!) 132/94 (BP Location: Right Arm)   Pulse 73   Temp 98.1 F (36.7 C)   Resp  17   Wt 78.8 kg   SpO2 98%   BMI 33.93 kg/m  Pain Scale: 0-10   Pain Score: Asleep   SpO2: SpO2: 98 % O2 Device:SpO2: 98 % O2 Flow Rate: .    Palliative Assessment/Data: 60%    Personally spent 90 minutes in patient care including extensive chart review (labs, imaging, progress/consult notes, vital signs), medically appropraite exam, discussed with treatment team, education to patient, family, and staff, documenting clinical information, medication review and  management, coordination of care, and available advanced directive documents.   Render Carrie, NP  Palliative Medicine Team Team phone # [phone]  Thank you for allowing the Palliative Medicine Team to assist in the care of this patient. Please utilize secure chat with additional questions, if there is no response within 30 minutes please call the above phone number.  Palliative Medicine Team providers are available by phone from 7am to 7pm daily and can be reached through the team cell phone.  Should this patient require assistance outside of these hours, please call the patient's attending physician.

## 2024-02-15 NOTE — Plan of Care (Signed)
   Problem: Education: Goal: Knowledge of General Education information will improve Description: Including pain rating scale, medication(s)/side effects and non-pharmacologic comfort measures Outcome: Progressing   Problem: Activity: Goal: Risk for activity intolerance will decrease Outcome: Progressing   Problem: Elimination: Goal: Will not experience complications related to bowel motility Outcome: Progressing

## 2024-02-15 NOTE — Plan of Care (Signed)
  Problem: Education: Goal: Ability to describe self-care measures that may prevent or decrease complications (Diabetes Survival Skills Education) will improve Outcome: Progressing   Problem: Coping: Goal: Ability to adjust to condition or change in health will improve Outcome: Progressing   Problem: Fluid Volume: Goal: Ability to maintain a balanced intake and output will improve Outcome: Progressing   Problem: Health Behavior/Discharge Planning: Goal: Ability to identify and utilize available resources and services will improve Outcome: Progressing Goal: Ability to manage health-related needs will improve Outcome: Progressing   Problem: Metabolic: Goal: Ability to maintain appropriate glucose levels will improve Outcome: Progressing   Problem: Nutritional: Goal: Maintenance of adequate nutrition will improve Outcome: Progressing Goal: Progress toward achieving an optimal weight will improve Outcome: Progressing   Problem: Skin Integrity: Goal: Risk for impaired skin integrity will decrease Outcome: Progressing

## 2024-02-15 NOTE — Consult Note (Signed)
 PODIATRY CONSULTATION  NAME Tina Hester MRN [id number] DOB 11/11/1940 DOA 02/14/2024   Reason for consult:  Chief Complaint  Patient presents with   Wound Check    Attending/Consulting physician: A. Amin MD  History of present illness: Tina Hester is a 83 y.o. female with medical history significant of diabetes mellitus type 2, hypothyroidism, hypertension, hyperlipidemia, dementia, peripheral arterial disease, bilateral carotid artery stenosis, aortic stenosis, OSA on CPAP, persistent atrial fibrillation/flutter on Eliquis , three-vessel CAD with history of non-STEMI presented with worsening right great toe wound redness, pain.  Patient does not remember why she is in the hospital.  History is obtained from the daughter.  The daughter states that she has had right great toe wound for months now but started getting red, swollen with some drainage for the last week for which she was put on oral doxycycline  since last week.  No fever, chest pain, worsening shortness of breath, abdominal pain, diarrhea, dysuria, loss of consciousness or seizures.  She had an episode of vomiting 2 days ago.   Pt has been following with Dr. Zettie Hillock for [facility]. The wound has apparently been present for months. She denies much pain in the right foot says all of her pain is in the left knee. I discussed the MRI findings and plan for probable amputation of the great toe pending evaluation of the blood flow with arterial studies that were being performed this AM. She is aware of plan. Will discuss with her daughter as well.  Past Medical History:  Diagnosis Date   Anemia    s/p Heme work -up normal EGD and colonscopy in 2012 per pt,neg SPEP   Anxiety    Aortic stenosis    Mild by echo 12/2021 with mean aortic valve gradient 17 mmHg   Bilateral carotid artery stenosis 06/08/2015   1-39% bilateral   Bradycardia    CKD (chronic kidney disease), stage III (HCC)    Diabetes mellitus without complication  (HCC)    type 2   GERD (gastroesophageal reflux disease)    Gout    HSV-1 (herpes simplex virus 1) infection    Acyclovir  prn   Hyperkalemia    Hyperlipidemia    Hypertension    Joint pain    osteoarthritis by Xray- possible erosion of R 4th MCP,elevated uric  acid ,ANA +    MCI (mild cognitive impairment)    Morbid obesity (HCC)    OSA on CPAP    Osteopenia    PAF (paroxysmal atrial fibrillation) (HCC)    Pain management    Neurosurg: Dr Gerri Kras   Pulmonary HTN (HCC)    Moderate by echo  2016. PASP   TIA (transient ischemic attack)    remote history of TIA's early 2000       Latest Ref Rng & Units 02/15/2024    5:52 AM 02/14/2024    1:51 PM 11/17/2023    3:07 AM  CBC  WBC 4.0 - 10.5 K/uL 8.8  9.0  8.0   Hemoglobin 12.0 - 15.0 g/dL 78.4  69.6  9.7   Hematocrit 36.0 - 46.0 % 33.6  35.0  30.5   Platelets 150 - 400 K/uL 175  188  162        Latest Ref Rng & Units 02/15/2024    5:52 AM 02/14/2024    1:51 PM 11/17/2023    3:07 AM  BMP  Glucose 70 - 99 mg/dL 295  284  132   BUN 8 -  23 mg/dL 19  23  17    Creatinine 0.44 - 1.00 mg/dL 8.29  5.62  1.30   Sodium 135 - 145 mmol/L 137  133  135   Potassium 3.5 - 5.1 mmol/L 4.3  3.8  4.2   Chloride 98 - 111 mmol/L 109  102  107   CO2 22 - 32 mmol/L 18  22  22    Calcium  8.9 - 10.3 mg/dL 8.1  8.4  8.0       Physical Exam: Lower Extremity Exam Right foot with erythema of the right great toe extending to MPJ level  Dorsal eschar with unstageable ulceration though likely probes to bone at the hallux IPJ  No active drainage  Diminished sensation to the digit level  Non palpable DP and PT pulses R foot, CFT intact to hallux.        ASSESSMENT/PLAN OF CARE 83 y.o. female with PMHx significant for  diabetes mellitus type 2, hypothyroidism, hypertension, hyperlipidemia, dementia, peripheral arterial disease, bilateral carotid artery stenosis, aortic stenosis, OSA on CPAP, persistent atrial fibrillation/flutter on  Eliquis , three-vessel CAD with history of non-STEMI  with right hallux dorsal IPJ ulceration with concern for ulceration of the right distal and proximal phalanx great toe.   WBC 8.8 CRP 2.2. ESR 41 MRI R toes W WO contrast: 1. Marrow signal abnormality of the distal phalanx and distal half of the proximal phalanx of the great toe with associated enhancement and trace effusion of the interphalangeal joint of the great toe, most compatible with osteomyelitis/septic arthritis.  - Discussed with patient, will require Right hallux amputation this admission. Timing pending if vascular angiogram is needed pre op.  - ABI PVR study and Arterial duplex in process this AM, discussed with Vascular appreciate recs, further plans pending vasc imaging this AM - Continue IV abx broad spectrum pending further culture data - Anticoagulation: Ok to continue per primary / vascular recs - Wound care:  None needed pre op - WB status: WBAT - Will continue to follow   Thank you for the consult.  Please contact me directly with any questions or concerns.           Maridee Shoemaker, DPM [facility] / [facility]    [address] [place], [place] [address]                Office [phone]  Fax [phone]

## 2024-02-15 NOTE — Consult Note (Addendum)
 Hospital Consult    Reason for Consult:  right great toe osteomyelitis Requesting Physician:  Standiford MD MRN #:  [id number]  History of Present Illness: Tina Hester is a 83 y.o. female with a past medical history of HTN, HLD, dementia, PAD, OSA on CPAP, atrial fibrillation on Eliquis , DMII, and three-vessel CAD with recent history of NSTEMI who was admitted to [facility] yesterday with a worsening right great toe ulceration.  History was obtained from the daughter given the patient's dementia.  The daughter says that the patient has had a right great toe wound for a couple of months now.  Over the past week, the patient's ulceration on started draining and developing surrounding erythema.  MRI on admission demonstrated osteomyelitis of the right great toe.   We were consulted by Dr. Rosemarie Conquest for evaluation of the patient's blood flow.  He is considering right great toe amputation for the patient's osteomyelitis.  The patient was recently seen by Dr. Edgardo Goodwill in the outpatient setting to discuss future left femoral endarterectomy for a chronic left lateral calf ulceration.  She has had a recent NSTEMI with no revascularization options and requires cardiac optimization prior to surgery.  Past Medical History:  Diagnosis Date   Anemia    s/p Heme work -up normal EGD and colonscopy in 2012 per pt,neg SPEP   Anxiety    Aortic stenosis    Mild by echo 12/2021 with mean aortic valve gradient 17 mmHg   Bilateral carotid artery stenosis 06/08/2015   1-39% bilateral   Bradycardia    CKD (chronic kidney disease), stage III (HCC)    Diabetes mellitus without complication (HCC)    type 2   GERD (gastroesophageal reflux disease)    Gout    HSV-1 (herpes simplex virus 1) infection    Acyclovir  prn   Hyperkalemia    Hyperlipidemia    Hypertension    Joint pain    osteoarthritis by Xray- possible erosion of R 4th MCP,elevated uric  acid ,ANA +    MCI (mild cognitive impairment)    Morbid  obesity (HCC)    OSA on CPAP    Osteopenia    PAF (paroxysmal atrial fibrillation) (HCC)    Pain management    Neurosurg: Dr Gerri Kras   Pulmonary HTN (HCC)    Moderate by echo  2016. PASP   TIA (transient ischemic attack)    remote history of TIA's early 2000    Past Surgical History:  Procedure Laterality Date   ABDOMINAL AORTOGRAM W/LOWER EXTREMITY N/A 11/11/2023   Procedure: ABDOMINAL AORTOGRAM W/LOWER EXTREMITY;  Surgeon: Carlene Che, MD;  Location: [facility] INVASIVE CV LAB;  Service: Cardiovascular;  Laterality: N/A;   ABDOMINAL HYSTERECTOMY     APPENDECTOMY     BACK SURGERY     cervical fusion   BLADDER SURGERY     CESAREAN SECTION     CHOLECYSTECTOMY     LEFT HEART CATH AND CORONARY ANGIOGRAPHY N/A 11/10/2023   Procedure: LEFT HEART CATH AND CORONARY ANGIOGRAPHY;  Surgeon: Odie Benne, MD;  Location: [facility] INVASIVE CV LAB;  Service: Cardiovascular;  Laterality: N/A;   LOWER EXTREMITY ANGIOGRAPHY N/A 02/25/2023   Procedure: Lower Extremity Angiography;  Surgeon: Carlene Che, MD;  [facility];  Service: Cardiovascular;  Laterality: N/A;   LOWER EXTREMITY ANGIOGRAPHY N/A 11/11/2023   Procedure: Lower Extremity Angiography;  Surgeon: Carlene Che, MD;  Location: [facility] INVASIVE CV LAB;  Service: Cardiovascular;  Laterality: N/A;  LOWER EXTREMITY INTERVENTION N/A 11/11/2023   Procedure: LOWER EXTREMITY INTERVENTION;  Surgeon: Carlene Che, MD;  Location: [facility] INVASIVE CV LAB;  Service: Cardiovascular;  Laterality: N/A;   PERIPHERAL VASCULAR INTERVENTION  02/25/2023   Procedure: PERIPHERAL VASCULAR INTERVENTION;  Surgeon: Carlene Che, MD;  Location: [facility] INVASIVE CV LAB;  Service: Cardiovascular;;    Allergies  Allergen Reactions   Pneumococcal Vaccine Anaphylaxis    weakness   Sulfa Antibiotics Hives   Pregabalin Other (See Comments)    Other reaction(s): depression, patient unsure    Prior to Admission medications   Medication  Sig Start Date End Date Taking? Authorizing Provider  acetaminophen  (TYLENOL ) 325 MG tablet Take 650 mg by mouth every 4 (four) hours as needed for mild pain (pain score 1-3) or moderate pain (pain score 4-6).   Yes [provider]  acyclovir  (ZOVIRAX ) 400 MG tablet Take 400 mg by mouth. 02/08/24 04/08/24 Yes [provider]  CALMOSEPTINE 0.44-20.6 % OINT Apply 1 Application topically daily as needed (Skin irritation). 10/26/23  Yes [provider]  clopidogrel  (PLAVIX ) 75 MG tablet Take 75 mg by mouth daily. 01/20/24  Yes [provider]  donepezil  (ARICEPT ) 5 MG tablet Take 5 mg by mouth daily. 12/17/22  Yes [provider]  doxycycline  (VIBRAMYCIN ) 100 MG capsule Take 100 mg by mouth 2 (two) times daily. 02/06/24  Yes [provider]  ELIQUIS  5 MG TABS tablet TAKE 1 TABLET(5 MG) BY MOUTH TWICE DAILY 01/02/24  Yes Turner, Rufus Council, MD  empagliflozin  (JARDIANCE ) 10 MG TABS tablet Take 10 mg by mouth daily.   Yes [provider]  ezetimibe  (ZETIA ) 10 MG tablet Take 10 mg by mouth every evening. 12/17/22  Yes [provider]  furosemide  (LASIX ) 20 MG tablet Take 20 mg by mouth as needed for fluid or edema. 01/20/24  Yes [provider]  gabapentin  (NEURONTIN ) 300 MG capsule Take 1 capsule (300 mg total) by mouth at bedtime. 11/17/23  Yes Audria Leather, MD  insulin  lispro (HUMALOG ) 100 UNIT/ML injection Inject 1-7 Units into the skin 3 (three) times daily before meals. Sliding scale 141-180=1 unit,181-220= 2 units,221-260= 3 units,261-300=4 units, 301-340=5 units, 341-380=6 units, 381-400 = 7 units,> 400 call MD   Yes [provider]  Insulin  NPH, Human,, Isophane, (NOVOLIN  N FLEXPEN) 100 UNIT/ML Kiwkpen Inject 15 units at morning and 18 units at night Patient taking differently: Inject 14-18 Units into the skin See admin instructions. Inject 14 units at morning and 18 units at night 09/24/21  Yes Lajean Pike, MD  levothyroxine   (SYNTHROID ) 150 MCG tablet Take 150 mcg by mouth daily before breakfast. 04/13/23  Yes [provider]  metoprolol  succinate (TOPROL -XL) 25 MG 24 hr tablet Take 1 tablet (25 mg total) by mouth 2 (two) times daily. 01/27/24  Yes Turner, Rufus Council, MD  olmesartan (BENICAR) 20 MG tablet Take 20 mg by mouth daily. 01/11/24  Yes [provider]  omeprazole (PRILOSEC) 20 MG capsule Take 20 mg by mouth daily as needed (acid reflux). 04/13/23  Yes [provider]  oxyCODONE -acetaminophen  (PERCOCET/ROXICET) 5-325 MG tablet Take 1 tablet by mouth every 4 (four) hours as needed for severe pain (pain score 7-10). 12/07/23  Yes Carlene Che, MD  OZEMPIC , 0.25 OR 0.5 MG/DOSE, 2 MG/3ML SOPN Inject 0.25 mg into the skin once a week. Sundays 03/14/23  Yes [provider]  polyethylene glycol (MIRALAX  / GLYCOLAX ) 17 g packet Take 17 g by mouth daily as needed. 11/17/23  Yes Audria Leather, MD  prednisoLONE  acetate (PRED FORTE ) 1 % ophthalmic suspension Apply 1 drop to eye. 02/08/24 02/03/2024 Yes [provider]  rosuvastatin  (CRESTOR ) 20 MG tablet Take 1 tablet (20 mg total) by mouth daily. 11/18/23  Yes Audria Leather, MD  SANTYL 250 UNIT/GM ointment Apply 1 Application topically as needed.   Yes [provider]  sertraline  (ZOLOFT ) 50 MG tablet Take 50 mg by mouth daily. 12/17/22  Yes [provider]  amoxicillin-clavulanate (AUGMENTIN) 250-62.5 MG/5ML suspension Take 10 mLs by mouth 2 (two) times daily. Patient not taking: Reported on 02/07/2024 01/21/24   [provider]  HYDROcodone -acetaminophen  (NORCO/VICODIN) 5-325 MG tablet Take 1 tablet by mouth every 6 (six) hours as needed for moderate pain (pain score 4-6). Patient not taking: Reported on 02/07/2024 11/08/23   Carlene Che, MD    Social History   Socioeconomic History   Marital status: Divorced    Spouse name: Not on file   Number of children: 4   Years of education: Not on file    Highest education level: Some college, no degree  Occupational History   Not on file  Tobacco Use   Smoking status: Never   Smokeless tobacco: Never  Vaping Use   Vaping status: Never Used  Substance and Sexual Activity   Alcohol use: No   Drug use: No   Sexual activity: Not on file  Other Topics Concern   Not on file  Social History Narrative   10/27/21 lives with daughter, Tina Hester   Some soda   Social Drivers of Health   Financial Resource Strain: Not on file  Food Insecurity: No Food Insecurity (02/14/2024)   Hunger Vital Sign    Worried About Running Out of Food in the Last Year: Never true    Ran Out of Food in the Last Year: Never true  Transportation Needs: No Transportation Needs (02/14/2024)   PRAPARE - Administrator, Civil Service (Medical): No    Lack of Transportation (Non-Medical): No  Physical Activity: Not on file  Stress: Not on file  Social Connections: Unknown (02/14/2024)   Social Connection and Isolation Panel    Frequency of Communication with Friends and Family: Patient unable to answer    Frequency of Social Gatherings with Friends and Family: Patient unable to answer    Attends Religious Services: Patient unable to answer    Active Member of Clubs or Organizations: Patient unable to answer    Attends Banker Meetings: Patient unable to answer    Marital Status: Divorced  Intimate Partner Violence: Not At Risk (02/14/2024)   Humiliation, Afraid, Rape, and Kick questionnaire    Fear of Current or Ex-Partner: No    Emotionally Abused: No    Physically Abused: No    Sexually Abused: No     Family History  Problem Relation Age of Onset   Arrhythmia Brother     ROS: Otherwise negative unless mentioned in HPI  Physical Examination  Vitals:   02/15/24 0528 02/15/24 0733  BP: 126/63 118/80  Pulse: 74 92  Resp: 15 17  Temp: 97.6 F (36.4 C) 97.6 F (36.4 C)  SpO2: 98% 97%   Body mass index is 33.93 kg/m.  General:   WDWN in NAD Gait: Not observed HENT: WNL, normocephalic Pulmonary: normal non-labored breathing Cardiac: regular Abdomen:  soft, NT/ND, no masses Skin: without rashes Vascular Exam/Pulses: nonpalpable pedal pulses bilaterally Extremities: right GT dry ulceration with erythema. Chronic left lateral calf  wound Musculoskeletal: no muscle wasting or atrophy  Neurologic: alert and oriented to self and place Psychiatric:  The pt has Normal affect. Lymph:  Unremarkable  CBC    Component Value Date/Time   WBC 8.8 02/15/2024 0552   RBC 3.90 02/15/2024 0552   HGB 10.3 (L) 02/15/2024 0552   HGB 11.0 (L) 06/01/2017 1046   HCT 33.6 (L) 02/15/2024 0552   HCT 32.8 (L) 06/01/2017 1046   PLT 175 02/15/2024 0552   PLT 196 06/01/2017 1046   MCV 86.2 02/15/2024 0552   MCV 92 06/01/2017 1046   MCH 26.4 02/15/2024 0552   MCHC 30.7 02/15/2024 0552   RDW 16.0 (H) 02/15/2024 0552   RDW 14.0 06/01/2017 1046   LYMPHSABS 1.8 02/14/2024 1351   LYMPHSABS 2.4 06/01/2017 1046   MONOABS 1.1 (H) 02/14/2024 1351   EOSABS 0.1 02/14/2024 1351   EOSABS 0.1 06/01/2017 1046   BASOSABS 0.1 02/14/2024 1351   BASOSABS 0.0 06/01/2017 1046    BMET    Component Value Date/Time   NA 137 02/15/2024 0552   NA 141 02/02/2018 1051   K 4.3 02/15/2024 0552   CL 109 02/15/2024 0552   CO2 18 (L) 02/15/2024 0552   GLUCOSE 100 (H) 02/15/2024 0552   BUN 19 02/15/2024 0552   BUN 36 (H) 02/02/2018 1051   CREATININE 1.32 (H) 02/15/2024 0552   CALCIUM  8.1 (L) 02/15/2024 0552   GFRNONAA 40 (L) 02/15/2024 0552   GFRAA 45 (L) 04/11/2018 1137    COAGS: No results found for: INR, PROTIME   Non-Invasive Vascular Imaging:   ABIs and RLE Arterial Duplex Ordered  Statin:  Yes.   Beta Blocker:  No. Aspirin :  No. ACEI:  No. ARB:  Yes.   CCB use:  No Other antiplatelets/anticoagulants:  Yes.   Plavix , Eliquis . Eliquis  held on 6/17   ASSESSMENT/PLAN: This is a 83 y.o. female admitted with right great toe  osteomyelitis   - The patient was admitted to [facility] yesterday with a worsening right great toe ulceration with surrounding erythema and swelling.  MRI on admission demonstrated right great toe osteomyelitis - Podiatry has been consulted and plans for right great toe amputation.  We were consulted to ensure the patient is vascularly optimized prior to toe amputation -The patient is well-known to our practice with a history of PAD and a chronic left lateral calf ulceration.  She was seen by Dr. Edgardo Goodwill 1 week ago in our office to discuss possible left femoral endarterectomy.  Given her extensive cardiac disease, she would require cardiac optimization prior to undergoing surgery - On exam she has nonpalpable pedal pulses bilaterally.  She has a chronic left lateral calf ulceration without drainage.  She has a right great toe ulceration with surrounding erythema. - The patient's most recent ABIs in March demonstrated monophasic tibial vessel flow on the right with a great toe pressure of , which is suboptimal for wound healing - We will order ABIs and a right lower extremity arterial duplex to further evaluate the patient's vascular status on the right.  If her arterial duplex demonstrates any flow-limiting stenosis on the right, we will likely plan for angiogram.  If she does require angiogram, this can likely be done by Dr. Edgardo Goodwill on Friday   Deneise Finlay PA-C [facility] [phone]   VASCULAR STAFF ADDENDUM: I have independently interviewed and examined the patient. I agree with the above.  In short, the patient is an 84 year old female with right great toe  ulceration.  MRI demonstrates osteomyelitis.   Plan for angiogram Friday with Dr. Edgardo Goodwill for critical limb ischemia with tissue loss.  After discussing the risks and benefits, Tina Hester elected to proceed.  Kayla Part MD [facility] Phone Number: [phone] 02/15/2024 6:32 PM

## 2024-02-15 NOTE — Progress Notes (Signed)
 RLE arterial duplex and ABI have been completed.    Results can be found under chart review under CV PROC. 02/15/2024 12:08 PM Cove Haydon RVT, RDMS

## 2024-02-15 NOTE — Hospital Course (Addendum)
 Brief Narrative:  83 year old with history of DM2, hypothyroidism, HTN, HLD, dementia, PAD, bilateral carotid artery stenosis, aortic stenosis, OSA on CPAP, persistent A-fib/flutter on Eliquis , three-vessel CAD comes to the hospital with right great toe erythema and pain along with some drainage.  Podiatry recommended transferring patient to [facility].  MRI of the foot is consistent with osteomyelitis/septic arthritis.  ABIs were abnormal.  Right lower extremity angiogram today.   Assessment & Plan:  Principal Problem:   Cellulitis of right foot     Diabetic right great toe wound infection with cellulitis and concern for osteomyelitis MRI of the foot confirms osteomyelitis/septic arthritis.  Currently on IV Unasyn  and linezolid .  Podiatry and vascular consulted - Abnormal ABIs, planning angiogram today.  Eventually likely will need amputation.   Diabetes mellitus type 2 with hyperglycemia Peripheral neuropathy Sliding scale and Accu-Cheks Gabapentin    Persistent atrial fibrillation/flutter Rate controlled Eliquis  on hold in anticipation for surgery   CKD stage IIIb - Creatinine stable.  Monitor.  Creatinine around baseline 1.3   Anemia of chronic disease Hemoglobin stable around 10.3   Hypertension hyperlipidemia - Continue statin, ezetimibe , beta-blocker and ARB   Dementia - Fall precautions.  Monitor mental status.  Aricept    History of three-vessel CAD - No chest pain.  Continue beta-blocker and ARB.  Hold Plavix  - Zetia    Hypothyroidism Synthroid    Goals of care - Consult palliative care for goals of care discussion.  Currently full code.      DVT prophylaxis: enoxaparin  (LOVENOX ) injection 40 mg Start: 02/14/24 2200    Code Status: Full Code Family Communication:   Final plan still to be determined   Subjective: Planned angiogram Examination:  General exam: Appears calm and comfortable  Respiratory system: Clear to auscultation. Respiratory  effort normal. Cardiovascular system: S1 & S2 heard, RRR. No JVD, murmurs, rubs, gallops or clicks. No pedal edema. Gastrointestinal system: Abdomen is nondistended, soft and nontender. No organomegaly or masses felt. Normal bowel sounds heard. Central nervous system: Alert and oriented. No focal neurological deficits. Extremities: Symmetric 5 x 5 power. Skin: No rashes, lesions or ulcers Psychiatry: Judgement and insight appear normal. Mood & affect appropriate.

## 2024-02-16 DIAGNOSIS — L03115 Cellulitis of right lower limb: Secondary | ICD-10-CM | POA: Diagnosis not present

## 2024-02-16 LAB — CBC
HCT: 33.3 % — ABNORMAL LOW (ref 36.0–46.0)
Hemoglobin: 10.4 g/dL — ABNORMAL LOW (ref 12.0–15.0)
MCH: 26.9 pg (ref 26.0–34.0)
MCHC: 31.2 g/dL (ref 30.0–36.0)
MCV: 86.3 fL (ref 80.0–100.0)
Platelets: 168 10*3/uL (ref 150–400)
RBC: 3.86 MIL/uL — ABNORMAL LOW (ref 3.87–5.11)
RDW: 15.9 % — ABNORMAL HIGH (ref 11.5–15.5)
WBC: 7.7 10*3/uL (ref 4.0–10.5)
nRBC: 0 % (ref 0.0–0.2)

## 2024-02-16 LAB — GLUCOSE, CAPILLARY
Glucose-Capillary: 124 mg/dL — ABNORMAL HIGH (ref 70–99)
Glucose-Capillary: 116 mg/dL — ABNORMAL HIGH (ref 70–99)
Glucose-Capillary: 156 mg/dL — ABNORMAL HIGH (ref 70–99)
Glucose-Capillary: 112 mg/dL — ABNORMAL HIGH (ref 70–99)

## 2024-02-16 LAB — MRSA NEXT GEN BY PCR, NASAL: MRSA by PCR Next Gen: NOT DETECTED

## 2024-02-16 LAB — BASIC METABOLIC PANEL WITH GFR
Anion gap: 9 (ref 5–15)
BUN: 21 mg/dL (ref 8–23)
CO2: 19 mmol/L — ABNORMAL LOW (ref 22–32)
Calcium: 8.1 mg/dL — ABNORMAL LOW (ref 8.9–10.3)
Chloride: 109 mmol/L (ref 98–111)
Creatinine, Ser: 1.47 mg/dL — ABNORMAL HIGH (ref 0.44–1.00)
GFR, Estimated: 35 mL/min — ABNORMAL LOW (ref >60)
Glucose, Bld: 124 mg/dL — ABNORMAL HIGH (ref 70–99)
Potassium: 3.9 mmol/L (ref 3.5–5.1)
Sodium: 137 mmol/L (ref 135–145)

## 2024-02-16 LAB — MAGNESIUM: Magnesium: 1.8 mg/dL (ref 1.7–2.4)

## 2024-02-16 MED ORDER — SODIUM CHLORIDE 0.9 % IV SOLN
3.0000 g | Freq: Two times a day (BID) | INTRAVENOUS | Status: DC
  Administered 2024-02-16 – 2024-02-17 (×2): 3 g via INTRAVENOUS
  Filled 2024-02-16 (×4): qty 8

## 2024-02-16 MED ORDER — SODIUM CHLORIDE 0.9 % IV SOLN: INTRAVENOUS | Status: DC

## 2024-02-16 NOTE — Progress Notes (Signed)
 PROGRESS NOTE    AARTHI Hester  XBJ:[id number] DOB: Oct 28, 1940 DOA: 02/14/2024 PCP: Olin Bertin, MD    Brief Narrative:  83 year old with history of DM2, hypothyroidism, HTN, HLD, dementia, PAD, bilateral carotid artery stenosis, aortic stenosis, OSA on CPAP, persistent A-fib/flutter on Eliquis , three-vessel CAD comes to the hospital with right great toe erythema and pain along with some drainage.  Podiatry recommended transferring patient to [facility].  MRI of the foot is consistent with osteomyelitis/septic arthritis.  ABIs were abnormal.  Vascular planning on angiogram on 6/20   Assessment & Plan:  Principal Problem:   Cellulitis of right foot     Diabetic right great toe wound infection with cellulitis and concern for osteomyelitis MRI of the foot confirms osteomyelitis/septic arthritis.  Currently on IV Unasyn and linezolid.  Podiatry and vascular consulted - Abnormal ABIs, planning angiogram tomorrow, gentle hydration   Diabetes mellitus type 2 with hyperglycemia Peripheral neuropathy Sliding scale and Accu-Cheks Gabapentin    Persistent atrial fibrillation/flutter Rate controlled Eliquis  on hold in anticipation for surgery   CKD stage IIIb - Creatinine stable.  Monitor.  Creatinine around baseline 1.3   Anemia of chronic disease Hemoglobin stable around 10.3   Hypertension hyperlipidemia - Continue statin, ezetimibe , beta-blocker and ARB   Dementia - Fall precautions.  Monitor mental status.  Aricept    History of three-vessel CAD - No chest pain.  Continue beta-blocker and ARB.  Hold Plavix  - Zetia    Hypothyroidism Synthroid    Goals of care - Consult palliative care for goals of care discussion.  Currently full code.      DVT prophylaxis: enoxaparin (LOVENOX) injection 40 mg Start: 02/14/24 2200      Code Status: Full Code Family Communication:   Angio tomorrow.    Subjective: Doing ok no complaints, waiting angio tomorrow.     Examination:  General exam: Appears calm and comfortable  Respiratory system: Clear to auscultation. Respiratory effort normal. Cardiovascular system: S1 & S2 heard, RRR. No JVD, murmurs, rubs, gallops or clicks. No pedal edema. Gastrointestinal system: Abdomen is nondistended, soft and nontender. No organomegaly or masses felt. Normal bowel sounds heard. Central nervous system: Alert and oriented. No focal neurological deficits. Extremities: Symmetric 5 x 5 power. Skin: No rashes, lesions or ulcers Psychiatry: Judgement and insight appear normal. Mood & affect appropriate.                Diet Orders (From admission, onward)     Start     Ordered   02/17/24 0001  Diet NPO time specified  Diet effective midnight        02/16/24 0742   02/14/24 1444  Diet heart healthy/carb modified Room service appropriate? Yes; Fluid consistency: Thin  Diet effective now       Question Answer Comment  Diet-HS Snack? Nothing   Room service appropriate? Yes   Fluid consistency: Thin      02/14/24 1445            Objective: Vitals:   02/15/24 1347 02/15/24 2001 02/16/24 0437 02/16/24 0851  BP: (!) 132/94 127/88 (!) 125/45 131/74  Pulse: 73 78 67 96  Resp: 17 18 17 18   Temp: 98.1 F (36.7 C) 98.4 F (36.9 C) 98.1 F (36.7 C) 97.7 F (36.5 C)  TempSrc:   Oral   SpO2: 98% 98% 99% 98%  Weight:        Intake/Output Summary (Last 24 hours) at 02/16/2024 1057 Last data filed at 02/15/2024 1700 Gross per 24  hour  Intake 480 ml  Output --  Net 480 ml   Filed Weights   02/14/24 1400  Weight: 78.8 kg    Scheduled Meds:  collagenase   Topical Daily   donepezil   5 mg Oral Daily   enoxaparin (LOVENOX) injection  40 mg Subcutaneous QHS   ezetimibe   10 mg Oral QPM   gabapentin   300 mg Oral QHS   insulin  aspart  0-15 Units Subcutaneous TID WC   insulin  aspart  0-5 Units Subcutaneous QHS   irbesartan   150 mg Oral Daily   levothyroxine   150 mcg Oral QAC breakfast    linezolid  600 mg Oral Q12H   metoprolol  succinate  25 mg Oral BID   rosuvastatin   20 mg Oral QHS   sertraline   50 mg Oral Daily   Continuous Infusions:  sodium chloride  75 mL/hr at 02/16/24 0837   ampicillin-sulbactam (UNASYN) IV 3 g (02/16/24 0533)    Nutritional status     Body mass index is 33.93 kg/m.  Data Reviewed:   CBC: Recent Labs  Lab 02/14/24 1351 02/15/24 0552 02/16/24 0538  WBC 9.0 8.8 7.7  NEUTROABS 5.9  --   --   HGB 10.8* 10.3* 10.4*  HCT 35.0* 33.6* 33.3*  MCV 87.9 86.2 86.3  PLT 188 175 168   Basic Metabolic Panel: Recent Labs  Lab 02/14/24 1351 02/15/24 0552 02/16/24 0538  NA 133* 137 137  K 3.8 4.3 3.9  CL 102 109 109  CO2 22 18* 19*  GLUCOSE 207* 100* 124*  BUN 23 19 21   CREATININE 1.32* 1.32* 1.47*  CALCIUM  8.4* 8.1* 8.1*  MG  --  1.9 1.8   GFR: Estimated Creatinine Clearance: 26.9 mL/min (A) (by C-G formula based on SCr of 1.47 mg/dL (H)). Liver Function Tests: Recent Labs  Lab 02/15/24 0552  AST 43*  ALT 29  ALKPHOS 71  BILITOT 0.8  PROT 5.9*  ALBUMIN 2.4*   No results for input(s): LIPASE, AMYLASE in the last 168 hours. No results for input(s): AMMONIA in the last 168 hours. Coagulation Profile: No results for input(s): INR, PROTIME in the last 168 hours. Cardiac Enzymes: No results for input(s): CKTOTAL, CKMB, CKMBINDEX, TROPONINI in the last 168 hours. BNP (last 3 results) No results for input(s): PROBNP in the last 8760 hours. HbA1C: No results for input(s): HGBA1C in the last 72 hours. CBG: Recent Labs  Lab 02/15/24 0819 02/15/24 1142 02/15/24 1649 02/15/24 2142 02/16/24 0611  GLUCAP 92 125* 225* 115* 124*   Lipid Profile: No results for input(s): CHOL, HDL, LDLCALC, TRIG, CHOLHDL, LDLDIRECT in the last 72 hours. Thyroid  Function Tests: No results for input(s): TSH, T4TOTAL, FREET4, T3FREE, THYROIDAB in the last 72 hours. Anemia Panel: No results for input(s):  VITAMINB12, FOLATE, FERRITIN, TIBC, IRON, RETICCTPCT in the last 72 hours. Sepsis Labs: No results for input(s): PROCALCITON, LATICACIDVEN in the last 168 hours.  Recent Results (from the past 240 hours)  Blood culture (routine x 2)     Status: None (Preliminary result)   Collection Time: 02/14/24  1:51 PM   Specimen: BLOOD  Result Value Ref Range Status   Specimen Description   Final    BLOOD BLOOD RIGHT ARM Performed at [facility], [address] [place], [place] [address]    Special Requests   Final    BOTTLES DRAWN AEROBIC AND ANAEROBIC Blood Culture adequate volume Performed at [facility], [address] [place], [place] [address]  Culture   Final    NO GROWTH 2 DAYS Performed at [facility], [address], [place], [place] [address]    Report Status PENDING  Incomplete  Blood culture (routine x 2)     Status: None (Preliminary result)   Collection Time: 02/14/24  1:51 PM   Specimen: BLOOD  Result Value Ref Range Status   Specimen Description   Final    BLOOD BLOOD LEFT ARM Performed at [facility], [address], [place], [place] [address]    Special Requests   Final    BOTTLES DRAWN AEROBIC AND ANAEROBIC Blood Culture adequate volume Performed at [facility], [address] [place], [place] [address]    Culture   Final    NO GROWTH 2 DAYS Performed at [facility], [address] [place], [place] [address]    Report Status PENDING  Incomplete         Radiology Studies: VAS US  LOWER EXTREMITY ARTERIAL DUPLEX Result Date: 02/15/2024 LOWER EXTREMITY ARTERIAL DUPLEX STUDY Patient Name:  Tina Hester  Date of Exam:   02/15/2024 Medical Rec #: [id number]           Accession #:    [id number] Date of Birth: 07-03-1941           Patient Gender: F Patient Age:   64 years Exam Location:  [facility] Procedure:      VAS US  LOWER EXTREMITY ARTERIAL  DUPLEX Referring Phys: [facility] --------------------------------------------------------------------------------  Indications: Ischemia & great toe ulceration/OM. High Risk Factors: Hypertension, hyperlipidemia, Diabetes, no history of                    smoking, prior MI, coronary artery disease. Other Factors: TIA, Afib, CKD, CHF.  Vascular Interventions: 02/25/2023 RLE femoropopliteal angioplasty & stenting. Current ABI:            RT - [place] LT - unable to obtain Limitations: continuous patient movement, calcific shadowing, tissue properties Comparison Study: Previous exam on 03/28/2023 RLE stent patent & ATA occlusion Performing Technologist: Jody Hill RVT, RDMS  Examination Guidelines: A complete evaluation includes B-mode imaging, spectral Doppler, color Doppler, and power Doppler as needed of all accessible portions of each vessel. Bilateral testing is considered an integral part of a complete examination. Limited examinations for reoccurring indications may be performed as noted.  +-----------+--------+-----+-------------+----------+--------------------------+ RIGHT      PSV cm/sRatioStenosis     Waveform  Comments                   +-----------+--------+-----+-------------+----------+--------------------------+ CFA Mid    78                        biphasic                             +-----------+--------+-----+-------------+----------+--------------------------+ DFA        65                        biphasic                             +-----------+--------+-----+-------------+----------+--------------------------+ SFA Prox   90                        biphasic                             +-----------+--------+-----+-------------+----------+--------------------------+  SFA Mid    79                        biphasic                             +-----------+--------+-----+-------------+----------+--------------------------+ POP Mid    206          50-74%        monophasiccalcific/heterogenous                              stenosis               plaque                     +-----------+--------+-----+-------------+----------+--------------------------+ POP Distal 100                       monophasiccalcific/heterogenous                                                     plaque                     +-----------+--------+-----+-------------+----------+--------------------------+ TP Trunk   63                        monophasic                           +-----------+--------+-----+-------------+----------+--------------------------+ ATA Prox                occluded                                          +-----------+--------+-----+-------------+----------+--------------------------+ ATA Mid                 occluded                                          +-----------+--------+-----+-------------+----------+--------------------------+ ATA Distal              occluded                                          +-----------+--------+-----+-------------+----------+--------------------------+ PTA Prox                                       unable to visualize due to                                                patient                    +-----------+--------+-----+-------------+----------+--------------------------+ PTA Mid  49                        monophasic                           +-----------+--------+-----+-------------+----------+--------------------------+ PTA Distal 29                        monophasic                           +-----------+--------+-----+-------------+----------+--------------------------+ PERO Prox                                      unable to visualize        +-----------+--------+-----+-------------+----------+--------------------------+ PERO Mid   78                        monophasic                            +-----------+--------+-----+-------------+----------+--------------------------+ PERO Distal71                        monophasic                           +-----------+--------+-----+-------------+----------+--------------------------+  Right Stent(s): +---------------+--++----------++ Prox to Stent  +---------------+--++----------++ Proximal Stent +---------------+--++----------++ Mid Stent      +---------------+--++----------++ Distal Stent   +---------------+--++----------++ Distal to Stent40monophasic +---------------+--++----------++    Summary: Right: 50-74% stenosis noted in the popliteal artery. Total occlusion noted in the anterior tibial artery. Femoropopliteal stent appears patent without evidence of significant stenosis. Arterial wall calcification throughout lower extremity. Heavy calcific shadowing in mid popliteal making imaging somewhat difficult.  See table(s) above for measurements and observations. Electronically signed by Irvin Mantel on 02/15/2024 at 7:06:14 PM.    Final    VAS US  ABI WITH/WO TBI Result Date: 02/15/2024  LOWER EXTREMITY DOPPLER STUDY Patient Name:  Tina Hester  Date of Exam:   02/15/2024 Medical Rec #: [id number]           Accession #:    [id number] Date of Birth: Dec 12, 1940           Patient Gender: F Patient Age:   10 years Exam Location:  [facility] Procedure:      VAS US  ABI WITH/WO TBI Referring Phys: ROBERT PATERSON --------------------------------------------------------------------------------  Indications: Ischemia & great toe ulceration/OM. High Risk         Hypertension, hyperlipidemia, prior MI, coronary artery Factors:          disease. Other Factors: TIA, Afib, CKD, CHF.  Vascular Interventions: 02/25/2023 RLE femoropopliteal angioplasty & stenting. Limitations: Today's exam was limited due to continuous patient movement, pain              intolerance. Comparison  Study: Previous exam on 11/08/2023 Performing Technologist: Arlyce Berger RVT, RDMS  Examination Guidelines: A complete evaluation includes at minimum, Doppler waveform signals and systolic blood pressure reading at the level of bilateral brachial, anterior tibial, and posterior tibial arteries, when vessel segments are accessible. Bilateral testing is considered an integral part of a complete examination. Photoelectric Plethysmograph (PPG) waveforms and toe systolic pressure  readings are included as required and additional duplex testing as needed. Limited examinations for reoccurring indications may be performed as noted.  ABI Findings: +---------+------------------+-----+-----------+--------+ Right    Rt Pressure (mmHg)IndexWaveform   Comment  +---------+------------------+-----+-----------+--------+ Brachial 140                    multiphasic         +---------+------------------+-----+-----------+--------+ PTA      254               1.81 monophasic          +---------+------------------+-----+-----------+--------+ DP       84                0.60 monophasic          +---------+------------------+-----+-----------+--------+ Great Toe                       Abnormal            +---------+------------------+-----+-----------+--------+ +---------+------------------+-----+-----------+-------+ Left     Lt Pressure (mmHg)IndexWaveform   Comment +---------+------------------+-----+-----------+-------+ Brachial 136                    multiphasic        +---------+------------------+-----+-----------+-------+ PTA                                              +---------+------------------+-----+-----------+-------+ DP                                               +---------+------------------+-----+-----------+-------+ Great Toe0                 0.00 Absent             +---------+------------------+-----+-----------+-------+    patient pain intolerance, unable to  attempt doppler/pressures.  Summary: Right: Resting right ankle-brachial index indicates noncompressible right lower extremity arteries. Unable to obtain TBI due to continuous patient movement, however doppler waveform severely dampened. *See table(s) above for measurements and observations.  Electronically signed by Irvin Mantel on 02/15/2024 at 7:05:15 PM.    Final    MR TOES RIGHT W WO CONTRAST Result Date: 02/14/2024 CLINICAL DATA:  Right great toe wound with pain and swelling. History of diabetes. EXAM: MRI OF THE RIGHT TOES WITHOUT AND WITH CONTRAST TECHNIQUE: Multiplanar, multisequence MR imaging of the right forefoot was performed both before and after administration of intravenous contrast. CONTRAST:  7mL GADAVIST  GADOBUTROL  1 MMOL/ML IV SOLN COMPARISON:  MRI of the right foot dated 12/31/2022. FINDINGS: Bones/Joint/Cartilage T2/STIR hyperintense marrow edema of the distal phalanx of the great toe and the distal half of the proximal phalanx of the great toe with associated enhancement on postcontrast sequences and subtle corresponding T1 hypointensity. Trace effusion of the interphalangeal joint of the great toe. These findings are most compatible with osteomyelitis/septic arthritis. No convincing marrow signal abnormality identified elsewhere to suggest osteomyelitis. No acute fracture or dislocation. Moderate first MTP joint space narrowing and osteophytosis. Mild-to-moderate joint space narrowing of the second through fifth MTP joints. Diffuse interphalangeal joint space narrowing. Moderate degenerative changes of the midfoot. Ligaments Collateral ligaments are intact.  Lisfranc ligament is intact. Muscles and Tendons Flexor and extensor compartment tendons are intact. Diffuse fatty atrophy  with increased T2 signal of the intrinsic musculature of the foot is nonspecific and may reflect chronic denervation changes. Soft tissue Soft tissue wound/ulceration at the dorsal aspect of the mid great toe. No  abscess. Diffuse nonspecific subcutaneous edema along the dorsal foot. IMPRESSION: 1. Marrow signal abnormality of the distal phalanx and distal half of the proximal phalanx of the great toe with associated enhancement and trace effusion of the interphalangeal joint of the great toe, most compatible with osteomyelitis/septic arthritis. 2. Soft tissue wound/ulceration at the dorsal aspect of the mid great toe. No abscess. Diffuse nonspecific subcutaneous edema along the dorsal foot. Cellulitis can not be excluded. 3. Mild-to-moderate degenerative changes of the forefoot and midfoot, as above. Electronically Signed   By: Mannie Seek M.D.   On: 02/14/2024 18:17           LOS: 2 days   Time spent= 35 mins    Maggie Schooner, MD [facility]  If 7PM-7AM, please contact night-coverage  02/16/2024, 10:57 AM

## 2024-02-16 NOTE — Plan of Care (Signed)
   Problem: Coping: Goal: Ability to adjust to condition or change in health will improve Outcome: Progressing   Problem: Nutritional: Goal: Maintenance of adequate nutrition will improve Outcome: Progressing   Problem: Skin Integrity: Goal: Risk for impaired skin integrity will decrease Outcome: Progressing   Problem: Tissue Perfusion: Goal: Adequacy of tissue perfusion will improve Outcome: Progressing

## 2024-02-16 NOTE — Progress Notes (Signed)
   PODIATRY PROGRESS NOTE Patient Name: Tina Hester  DOB 12/12/1940 DOA 02/14/2024  Hospital Day: 3  Assessment:  83 y.o. female with PMHx significant for  diabetes mellitus type 2, hypothyroidism, hypertension, hyperlipidemia, dementia, peripheral arterial disease, bilateral carotid artery stenosis, aortic stenosis, OSA on CPAP, persistent atrial fibrillation/flutter on Eliquis , three-vessel CAD with history of non-STEMI  with right hallux dorsal IPJ ulceration with concern for osteomyelitis of the distal and proximal phalanx.   WBC 7.7 CRP 2.2. ESR 41 MRI R toes W WO contrast: 1. Marrow signal abnormality of the distal phalanx and distal half of the proximal phalanx of the great toe with associated enhancement and trace effusion of the interphalangeal joint of the great toe, most compatible with osteomyelitis/septic arthritis.  Plan:  - Plan for R great toe amputation Monday 6/23, NPO at [place] night prior. Patient and her daughter in agreement  to proceed - Appreciate vascular surgery, plan for angiogram RLE tomorrow with Dr. Edgardo Goodwill to optimize vascular healing potential for amputation  - Continue IV abx broad spectrum pending further culture data - Anticoagulation: Ok to continue per primary / vascular recs - Wound care:  None needed pre op - WB status: WBAT in post op shoe - Will continue to follow        Maridee Shoemaker, DPM [facility]    Subjective:  Discussed with patient and her daughter re plan for toe amputation likely Monday and they are in agreement. Answered all questions and concerns. She is aware of vascular procedure planned for tomorrow.   Objective:   Vitals:   02/16/24 0437 02/16/24 0851  BP: (!) 125/45 131/74  Pulse: 67 96  Resp: 17 18  Temp: 98.1 F (36.7 C) 97.7 F (36.5 C)  SpO2: 99% 98%       Latest Ref Rng & Units 02/16/2024    5:38 AM 02/15/2024    5:52 AM 02/14/2024    1:51 PM  CBC  WBC 4.0 - 10.5 K/uL 7.7  8.8  9.0    Hemoglobin 12.0 - 15.0 g/dL 09.8  11.9  14.7   Hematocrit 36.0 - 46.0 % 33.3  33.6  35.0   Platelets 150 - 400 K/uL 168  175  188        Latest Ref Rng & Units 02/16/2024    5:38 AM 02/15/2024    5:52 AM 02/14/2024    1:51 PM  BMP  Glucose 70 - 99 mg/dL 829  562  130   BUN 8 - 23 mg/dL 21  19  23    Creatinine 0.44 - 1.00 mg/dL 8.65  7.84  6.96   Sodium 135 - 145 mmol/L 137  137  133   Potassium 3.5 - 5.1 mmol/L 3.9  4.3  3.8   Chloride 98 - 111 mmol/L 109  109  102   CO2 22 - 32 mmol/L 19  18  22    Calcium  8.9 - 10.3 mg/dL 8.1  8.1  8.4     General: AAOx3, NAD  Lower Extremity Exam Right foot with erythema of the right great toe extending to MPJ level   Dorsal eschar with unstageable ulceration though likely probes to bone at the hallux IPJ  No active drainage   Diminished sensation to the digit level   Non palpable DP and PT pulses R foot, CFT intact to hallux.       Radiology:  Results reviewed. See assessment for pertinent imaging results

## 2024-02-16 NOTE — Plan of Care (Signed)
  Problem: Health Behavior/Discharge Planning: Goal: Ability to identify and utilize available resources and services will improve Outcome: Progressing   Problem: Nutritional: Goal: Progress toward achieving an optimal weight will improve Outcome: Progressing   Problem: Skin Integrity: Goal: Risk for impaired skin integrity will decrease Outcome: Progressing   Problem: Clinical Measurements: Goal: Will remain free from infection Outcome: Progressing

## 2024-02-16 NOTE — Progress Notes (Addendum)
  Progress Note    02/16/2024 7:37 AM Hospital Day 2  Subjective:  no complaints; wants to know if she will be asleep for procedure.    afebrile  Vitals:   02/15/24 2001 02/16/24 0437  BP: 127/88 (!) 125/45  Pulse: 78 67  Resp: 18 17  Temp: 98.4 F (36.9 C) 98.1 F (36.7 C)  SpO2: 98% 99%    Physical Exam: General:  no distress Lungs:  non labored   CBC    Component Value Date/Time   WBC 7.7 02/16/2024 0538   RBC 3.86 (L) 02/16/2024 0538   HGB 10.4 (L) 02/16/2024 0538   HGB 11.0 (L) 06/01/2017 1046   HCT 33.3 (L) 02/16/2024 0538   HCT 32.8 (L) 06/01/2017 1046   PLT 168 02/16/2024 0538   PLT 196 06/01/2017 1046   MCV 86.3 02/16/2024 0538   MCV 92 06/01/2017 1046   MCH 26.9 02/16/2024 0538   MCHC 31.2 02/16/2024 0538   RDW 15.9 (H) 02/16/2024 0538   RDW 14.0 06/01/2017 1046   LYMPHSABS 1.8 02/14/2024 1351   LYMPHSABS 2.4 06/01/2017 1046   MONOABS 1.1 (H) 02/14/2024 1351   EOSABS 0.1 02/14/2024 1351   EOSABS 0.1 06/01/2017 1046   BASOSABS 0.1 02/14/2024 1351   BASOSABS 0.0 06/01/2017 1046    BMET    Component Value Date/Time   NA 137 02/16/2024 0538   NA 141 02/02/2018 1051   K 3.9 02/16/2024 0538   CL 109 02/16/2024 0538   CO2 19 (L) 02/16/2024 0538   GLUCOSE 124 (H) 02/16/2024 0538   BUN 21 02/16/2024 0538   BUN 36 (H) 02/02/2018 1051   CREATININE 1.47 (H) 02/16/2024 0538   CALCIUM  8.1 (L) 02/16/2024 0538   GFRNONAA 35 (L) 02/16/2024 0538   GFRAA 45 (L) 04/11/2018 1137    INR No results found for: INR   Intake/Output Summary (Last 24 hours) at 02/16/2024 0737 Last data filed at 02/15/2024 1700 Gross per 24 hour  Intake 720 ml  Output --  Net 720 ml     Assessment/Plan:  83 y.o. female with right great toe osteomyelitis  Hospital Day 2  -consulted by Dr. Rosemarie Conquest yesterday for evaluation for blood flow as he is considering right great toe amputation.  -plan for angiogram tomorrow with Dr. Edgardo Goodwill.  Discussed most likely will access  through left groin to evaluate right leg.  Also discussed she will have some sedation.  -creatinine is 1.47 today from 1.28 yesterday but has been as high as 1.7 in March 2025, recommend IV hydration.  -npo after MN/consent/labs ordered.    Maryanna Smart, PA-C [facility] [phone] 02/16/2024 7:37 AM

## 2024-02-16 NOTE — Progress Notes (Signed)
 PHARMACY NOTE:  ANTIMICROBIAL RENAL DOSAGE ADJUSTMENT  Current antimicrobial regimen includes a mismatch between antimicrobial dosage and estimated renal function.  As per policy approved by the Pharmacy & Therapeutics and Medical Executive Committees, the antimicrobial dosage will be adjusted accordingly.  Current antimicrobial dosage:  Unasyn 3g IV q8h  Indication: Cellulitis with concern for osteomyelitis  Renal Function:  Estimated Creatinine Clearance: 26.9 mL/min (A) (by C-G formula based on SCr of 1.47 mg/dL (H)). []      On intermittent HD, scheduled: []      On CRRT    Antimicrobial dosage has been changed to:  Unasyn 3g IV q12  Additional comments:   Thank you for allowing pharmacy to be a part of this patient's care.  Lenard Quam, PharmD, BCPS, BCCCP Please refer to [facility] for [facility] numbers 02/16/2024 12:24 PM

## 2024-02-16 NOTE — TOC Initial Note (Signed)
 Transition of Care [facility]) - Initial/Assessment Note    Patient Details  Name: Tina Hester MRN: [id number] Date of Birth: Dec 17, 1940  Transition of [facility]) CM/SW Contact:    Alisa App, RN Phone Number: 02/16/2024, 2:30 PM  Clinical Narrative:                 Presents with diabetic foot wound.Hx of PAD, dementia, chronic wounds, and diabetes.  From home with daughter. Recently d/c from [facility]. Daughter states would like to use again if needed @ d/c.   Plan: R great toe amputation Monday 6/23.  TOC team following and will assist with needs...   Expected Discharge Plan: Home w Home Health Services Barriers to Discharge: Continued Medical Work up   Patient Goals and CMS Choice            Expected Discharge Plan and Services   Discharge Planning Services: CM Consult   Living arrangements for the past 2 months: Single Family Home                                      Prior Living Arrangements/Services Living arrangements for the past 2 months: Single Family Home Lives with:: Adult Children Patient language and need for interpreter reviewed:: Yes Do you feel safe going back to the place where you live?: Yes      Need for Family Participation in Patient Care: Yes (Comment)     Criminal Activity/Legal Involvement Pertinent to Current Situation/Hospitalization: No - Comment as needed  Activities of Daily Living   ADL Screening (condition at time of admission) Independently performs ADLs?: No Does the patient have a NEW difficulty with bathing/dressing/toileting/self-feeding that is expected to last >3 days?: No Does the patient have a NEW difficulty with getting in/out of bed, walking, or climbing stairs that is expected to last >3 days?: No Does the patient have a NEW difficulty with communication that is expected to last >3 days?: No Is the patient deaf or have difficulty hearing?: Yes Does the patient have difficulty seeing, even when  wearing glasses/contacts?: No Does the patient have difficulty concentrating, remembering, or making decisions?: Yes  Permission Sought/Granted                  Emotional Assessment       Orientation: : Oriented to Self, Oriented to Situation Alcohol / Substance Use: Not Applicable Psych Involvement: No (comment)  Admission diagnosis:  Cellulitis of right foot [L03.115] Cellulitis of toe of right foot [L03.031] Patient Active Problem List   Diagnosis Date Noted   Osteomyelitis of great toe of right foot (HCC) 02/15/2024   Cellulitis of right foot 02/14/2024   Heart failure with preserved ejection fraction (HCC) 11/10/2023   NSTEMI (non-ST elevated myocardial infarction) (HCC) 11/10/2023   PVD (peripheral vascular disease) (HCC) 11/10/2023   Acute respiratory failure with hypoxia (HCC) 11/10/2023   Chronic ulcer of left leg (HCC) 11/10/2023   Pain of left hand 07/19/2023   Pressure ulcer of right heel, stage 3 (HCC) 05/26/2023   History of falling 05/09/2023   Hypertensive heart and chronic kidney disease with heart failure and stage 1 through stage 4 chronic kidney disease, or unspecified chronic kidney disease (HCC) 05/09/2023   Long term (current) use of insulin  (HCC) 05/09/2023   Pain in right ankle and joints of right foot 05/09/2023   Personal history of transient ischemic attack (  TIA), and cerebral infarction without residual deficits 05/09/2023   Type 2 diabetes mellitus with diabetic chronic kidney disease (HCC) 05/09/2023   Anemia, unspecified 01/18/2023   Left leg swelling 12/31/2022   Leukocytosis 12/31/2022   Hyponatremia 12/31/2022   Type 2 diabetes mellitus with hyperglycemia (HCC) 12/31/2022   GERD (gastroesophageal reflux disease) 12/31/2022   Dementia without behavioral disturbance (HCC) 12/31/2022   Open wound of right heel 12/30/2022   Abdominal aortic atherosclerosis (HCC) 11/05/2020   Acquired thrombophilia (HCC) 11/05/2020   Lumbar radiculopathy  11/05/2020   Hematoma of lower leg 12/18/2018   Low back pain 12/18/2018   Contusion of left knee 06/16/2018   Pain in left knee 06/16/2018   Rib pain 06/16/2018   Glaucoma suspect of left eye 12/22/2017   Secondary open-angle glaucoma of right eye, indeterminate stage 12/22/2017   Postural dizziness with presyncope 12/13/2017   Chest pain 12/13/2017   Hyperkalemia 12/13/2017   Acute kidney injury (HCC) 12/13/2017   Fluid overload 12/13/2017   Bradycardia 12/13/2017   CHF (congestive heart failure) (HCC) 12/13/2017   Aortic stenosis 11/02/2016   Aortic valve disorder 11/02/2016   Abrasion of right cornea 04/12/2016   Status post corneal transplant 04/12/2016   Combined forms of age-related cataract of left eye 04/12/2016   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   Pulmonary HTN (HCC) 12/15/2015   Bilateral carotid artery stenosis 06/08/2015   Essential hypertension    OSA on CPAP    Persistent atrial fibrillation (HCC)    Obesity (BMI 30-39.9) 09/04/2013   Gout 09/04/2013   DM2 (diabetes mellitus, type 2) (HCC) 05/29/2013   Mixed hyperlipidemia 05/29/2013   Anxiety 01/31/2012   Chronic kidney disease, stage 3b (HCC) 01/31/2012   Diabetes mellitus type 2 with complications, uncontrolled 01/31/2012   Acquired hypothyroidism 01/31/2012   TIA (transient ischemic attack) 01/31/2012   H/O difficult intubation 01/31/2012   Lupus 01/31/2012   CAD (coronary artery disease), native coronary artery 01/26/2012   PCP:  Olin Bertin, MD Pharmacy:   [facility] [phone] - [place], [place] - [address] [place] [address] Phone: [phone] Fax: [phone]     Social Drivers of Health (SDOH) Social History: SDOH Screenings   Food Insecurity: No Food Insecurity (02/14/2024)  Housing: Low Risk  (02/14/2024)  Transportation Needs: No Transportation Needs (02/14/2024)  Utilities: Not At Risk (02/14/2024)   Depression (PHQ2-9): Low Risk  (06/23/2020)  Social Connections: Unknown (02/14/2024)  Tobacco Use: Low Risk  (02/08/2024)   Received from [facility]   SDOH Interventions:     Readmission Risk Interventions    11/17/2023    1:08 PM  Readmission Risk Prevention Plan  Transportation Screening Complete  HRI or Home Care Consult Complete  Social Work Consult for Recovery Care Planning/Counseling Complete  Palliative Care Screening Complete  Medication Review Oceanographer) Complete

## 2024-02-17 ENCOUNTER — Inpatient Hospital Stay (HOSPITAL_COMMUNITY)

## 2024-02-17 ENCOUNTER — Inpatient Hospital Stay (HOSPITAL_COMMUNITY): Admitting: Certified Registered Nurse Anesthetist

## 2024-02-17 ENCOUNTER — Encounter (HOSPITAL_COMMUNITY): Admission: EM | Disposition: E | Payer: Self-pay | Source: Home / Self Care | Attending: Internal Medicine

## 2024-02-17 ENCOUNTER — Encounter (HOSPITAL_COMMUNITY): Payer: Self-pay | Admitting: Internal Medicine

## 2024-02-17 DIAGNOSIS — Z01818 Encounter for other preprocedural examination: Secondary | ICD-10-CM

## 2024-02-17 DIAGNOSIS — I70238 Atherosclerosis of native arteries of right leg with ulceration of other part of lower right leg: Secondary | ICD-10-CM

## 2024-02-17 DIAGNOSIS — I2510 Atherosclerotic heart disease of native coronary artery without angina pectoris: Secondary | ICD-10-CM

## 2024-02-17 DIAGNOSIS — I110 Hypertensive heart disease with heart failure: Secondary | ICD-10-CM | POA: Diagnosis not present

## 2024-02-17 DIAGNOSIS — I25118 Atherosclerotic heart disease of native coronary artery with other forms of angina pectoris: Secondary | ICD-10-CM

## 2024-02-17 DIAGNOSIS — I509 Heart failure, unspecified: Secondary | ICD-10-CM

## 2024-02-17 DIAGNOSIS — N183 Chronic kidney disease, stage 3 unspecified: Secondary | ICD-10-CM

## 2024-02-17 DIAGNOSIS — I350 Nonrheumatic aortic (valve) stenosis: Secondary | ICD-10-CM

## 2024-02-17 DIAGNOSIS — I97638 Postprocedural hematoma of a circulatory system organ or structure following other circulatory system procedure: Secondary | ICD-10-CM

## 2024-02-17 DIAGNOSIS — R579 Shock, unspecified: Secondary | ICD-10-CM

## 2024-02-17 DIAGNOSIS — L03115 Cellulitis of right lower limb: Secondary | ICD-10-CM | POA: Diagnosis not present

## 2024-02-17 DIAGNOSIS — Z95820 Peripheral vascular angioplasty status with implants and grafts: Secondary | ICD-10-CM

## 2024-02-17 DIAGNOSIS — T148XXA Other injury of unspecified body region, initial encounter: Secondary | ICD-10-CM

## 2024-02-17 DIAGNOSIS — I951 Orthostatic hypotension: Secondary | ICD-10-CM

## 2024-02-17 DIAGNOSIS — I4891 Unspecified atrial fibrillation: Secondary | ICD-10-CM

## 2024-02-17 DIAGNOSIS — E119 Type 2 diabetes mellitus without complications: Secondary | ICD-10-CM

## 2024-02-17 HISTORY — PX: LOWER EXTREMITY INTERVENTION: CATH118252

## 2024-02-17 HISTORY — PX: APPLICATION OF WOUND VAC: SHX5189

## 2024-02-17 HISTORY — PX: FEMORAL ARTERY EXPLORATION: SHX5160

## 2024-02-17 HISTORY — PX: LOWER EXTREMITY ANGIOGRAPHY: CATH118251

## 2024-02-17 HISTORY — PX: REPAIR ILIAC ARTERY: SHX6216

## 2024-02-17 LAB — CBC
HCT: 32.6 % — ABNORMAL LOW (ref 36.0–46.0)
HCT: 32.1 % — ABNORMAL LOW (ref 36.0–46.0)
HCT: 31.7 % — ABNORMAL LOW (ref 36.0–46.0)
Hemoglobin: 10 g/dL — ABNORMAL LOW (ref 12.0–15.0)
Hemoglobin: 9.9 g/dL — ABNORMAL LOW (ref 12.0–15.0)
Hemoglobin: 9.4 g/dL — ABNORMAL LOW (ref 12.0–15.0)
MCH: 26.8 pg (ref 26.0–34.0)
MCH: 27.3 pg (ref 26.0–34.0)
MCH: 26.8 pg (ref 26.0–34.0)
MCHC: 30.7 g/dL (ref 30.0–36.0)
MCHC: 30.8 g/dL (ref 30.0–36.0)
MCHC: 29.7 g/dL — ABNORMAL LOW (ref 30.0–36.0)
MCV: 87.4 fL (ref 80.0–100.0)
MCV: 88.7 fL (ref 80.0–100.0)
MCV: 90.3 fL (ref 80.0–100.0)
Platelets: 175 10*3/uL (ref 150–400)
Platelets: 225 10*3/uL (ref 150–400)
Platelets: 286 10*3/uL (ref 150–400)
RBC: 3.73 MIL/uL — ABNORMAL LOW (ref 3.87–5.11)
RBC: 3.62 MIL/uL — ABNORMAL LOW (ref 3.87–5.11)
RBC: 3.51 MIL/uL — ABNORMAL LOW (ref 3.87–5.11)
RDW: 15.8 % — ABNORMAL HIGH (ref 11.5–15.5)
RDW: 15.9 % — ABNORMAL HIGH (ref 11.5–15.5)
RDW: 15.9 % — ABNORMAL HIGH (ref 11.5–15.5)
WBC: 7.6 10*3/uL (ref 4.0–10.5)
WBC: 9.5 10*3/uL (ref 4.0–10.5)
WBC: 12.9 10*3/uL — ABNORMAL HIGH (ref 4.0–10.5)
nRBC: 0 % (ref 0.0–0.2)
nRBC: 0 % (ref 0.0–0.2)
nRBC: 0 % (ref 0.0–0.2)

## 2024-02-17 LAB — COMPREHENSIVE METABOLIC PANEL WITH GFR
ALT: 173 U/L — ABNORMAL HIGH (ref 0–44)
AST: 350 U/L — ABNORMAL HIGH (ref 15–41)
Albumin: 2.7 g/dL — ABNORMAL LOW (ref 3.5–5.0)
Alkaline Phosphatase: 44 U/L (ref 38–126)
Anion gap: 16 — ABNORMAL HIGH (ref 5–15)
BUN: 22 mg/dL (ref 8–23)
CO2: 11 mmol/L — ABNORMAL LOW (ref 22–32)
Calcium: 9.7 mg/dL (ref 8.9–10.3)
Chloride: 116 mmol/L — ABNORMAL HIGH (ref 98–111)
Creatinine, Ser: 1.65 mg/dL — ABNORMAL HIGH (ref 0.44–1.00)
GFR, Estimated: 31 mL/min — ABNORMAL LOW (ref >60)
Glucose, Bld: 146 mg/dL — ABNORMAL HIGH (ref 70–99)
Potassium: 4.5 mmol/L (ref 3.5–5.1)
Sodium: 143 mmol/L (ref 135–145)
Total Bilirubin: 0.6 mg/dL (ref 0.0–1.2)
Total Protein: 4.6 g/dL — ABNORMAL LOW (ref 6.5–8.1)

## 2024-02-17 LAB — POCT I-STAT 7, (LYTES, BLD GAS, ICA,H+H)
Acid-base deficit: 18 mmol/L — ABNORMAL HIGH (ref 0.0–2.0)
Acid-base deficit: 24 mmol/L — ABNORMAL HIGH (ref 0.0–2.0)
Bicarbonate: 9.4 mmol/L — ABNORMAL LOW (ref 20.0–28.0)
Bicarbonate: 6.3 mmol/L — ABNORMAL LOW (ref 20.0–28.0)
Calcium, Ion: 1.12 mmol/L — ABNORMAL LOW (ref 1.15–1.40)
Calcium, Ion: 0.76 mmol/L — CL (ref 1.15–1.40)
HCT: 24 % — ABNORMAL LOW (ref 36.0–46.0)
HCT: 25 % — ABNORMAL LOW (ref 36.0–46.0)
Hemoglobin: 8.2 g/dL — ABNORMAL LOW (ref 12.0–15.0)
Hemoglobin: 8.5 g/dL — ABNORMAL LOW (ref 12.0–15.0)
O2 Saturation: 100 %
O2 Saturation: 100 %
Patient temperature: 34.8
Potassium: 5.3 mmol/L — ABNORMAL HIGH (ref 3.5–5.1)
Potassium: 6.3 mmol/L (ref 3.5–5.1)
Sodium: 141 mmol/L (ref 135–145)
Sodium: 141 mmol/L (ref 135–145)
TCO2: 10 mmol/L — ABNORMAL LOW (ref 22–32)
TCO2: 7 mmol/L — ABNORMAL LOW (ref 22–32)
pCO2 arterial: 28.5 mmHg — ABNORMAL LOW (ref 32–48)
pCO2 arterial: 26.5 mmHg — ABNORMAL LOW (ref 32–48)
pH, Arterial: 7.127 — CL (ref 7.35–7.45)
pH, Arterial: 6.97 — CL (ref 7.35–7.45)
pO2, Arterial: 531 mmHg — ABNORMAL HIGH (ref 83–108)
pO2, Arterial: 551 mmHg — ABNORMAL HIGH (ref 83–108)

## 2024-02-17 LAB — CBC WITH DIFFERENTIAL/PLATELET
Abs Immature Granulocytes: 0.15 10*3/uL — ABNORMAL HIGH (ref 0.00–0.07)
Basophils Absolute: 0.1 10*3/uL (ref 0.0–0.1)
Basophils Relative: 0 %
Eosinophils Absolute: 0 10*3/uL (ref 0.0–0.5)
Eosinophils Relative: 0 %
HCT: 26.8 % — ABNORMAL LOW (ref 36.0–46.0)
Hemoglobin: 8 g/dL — ABNORMAL LOW (ref 12.0–15.0)
Immature Granulocytes: 1 %
Lymphocytes Relative: 11 %
Lymphs Abs: 1.5 10*3/uL (ref 0.7–4.0)
MCH: 28.3 pg (ref 26.0–34.0)
MCHC: 29.9 g/dL — ABNORMAL LOW (ref 30.0–36.0)
MCV: 94.7 fL (ref 80.0–100.0)
Monocytes Absolute: 0.9 10*3/uL (ref 0.1–1.0)
Monocytes Relative: 7 %
Neutro Abs: 11 10*3/uL — ABNORMAL HIGH (ref 1.7–7.7)
Neutrophils Relative %: 81 %
Platelets: 175 10*3/uL (ref 150–400)
RBC: 2.83 MIL/uL — ABNORMAL LOW (ref 3.87–5.11)
RDW: 15.4 % (ref 11.5–15.5)
WBC: 13.6 10*3/uL — ABNORMAL HIGH (ref 4.0–10.5)
nRBC: 0 % (ref 0.0–0.2)

## 2024-02-17 LAB — MAGNESIUM
Magnesium: 1.8 mg/dL (ref 1.7–2.4)
Magnesium: 1.9 mg/dL (ref 1.7–2.4)

## 2024-02-17 LAB — BASIC METABOLIC PANEL WITH GFR
Anion gap: 10 (ref 5–15)
BUN: 21 mg/dL (ref 8–23)
CO2: 20 mmol/L — ABNORMAL LOW (ref 22–32)
Calcium: 8.2 mg/dL — ABNORMAL LOW (ref 8.9–10.3)
Chloride: 110 mmol/L (ref 98–111)
Creatinine, Ser: 1.54 mg/dL — ABNORMAL HIGH (ref 0.44–1.00)
GFR, Estimated: 33 mL/min — ABNORMAL LOW (ref >60)
Glucose, Bld: 103 mg/dL — ABNORMAL HIGH (ref 70–99)
Potassium: 3.7 mmol/L (ref 3.5–5.1)
Sodium: 140 mmol/L (ref 135–145)

## 2024-02-17 LAB — PREPARE RBC (CROSSMATCH)

## 2024-02-17 LAB — GLUCOSE, CAPILLARY
Glucose-Capillary: 101 mg/dL — ABNORMAL HIGH (ref 70–99)
Glucose-Capillary: 132 mg/dL — ABNORMAL HIGH (ref 70–99)
Glucose-Capillary: 121 mg/dL — ABNORMAL HIGH (ref 70–99)
Glucose-Capillary: 161 mg/dL — ABNORMAL HIGH (ref 70–99)

## 2024-02-17 LAB — POCT ACTIVATED CLOTTING TIME
Activated Clotting Time: 285 s
Activated Clotting Time: 227 s
Activated Clotting Time: 199 s
Activated Clotting Time: 176 s

## 2024-02-17 LAB — PHOSPHORUS: Phosphorus: 7 mg/dL — ABNORMAL HIGH (ref 2.5–4.6)

## 2024-02-17 LAB — LACTIC ACID, PLASMA: Lactic Acid, Venous: 8.4 mmol/L (ref 0.5–1.9)

## 2024-02-17 LAB — CK: Total CK: 39 U/L (ref 38–234)

## 2024-02-17 LAB — ABO/RH: ABO/RH(D): A NEG

## 2024-02-17 LAB — MRSA NEXT GEN BY PCR, NASAL: MRSA by PCR Next Gen: NOT DETECTED

## 2024-02-17 SURGERY — EXPLORATION, ARTERY, FEMORAL
Anesthesia: General | Site: Groin | Laterality: Right

## 2024-02-17 MED ORDER — PHENYLEPHRINE HCL-NACL 20-0.9 MG/250ML-% IV SOLN
INTRAVENOUS | Status: DC | PRN
  Administered 2024-02-17: 40 ug/min via INTRAVENOUS

## 2024-02-17 MED ORDER — HYDROMORPHONE HCL 1 MG/ML IJ SOLN
0.5000 mg | INTRAMUSCULAR | Status: DC | PRN
  Administered 2024-02-18: 0.5 mg via INTRAVENOUS
  Filled 2024-02-17: qty 0.5

## 2024-02-17 MED ORDER — ROCURONIUM BROMIDE 10 MG/ML (PF) SYRINGE
PREFILLED_SYRINGE | INTRAVENOUS | Status: DC | PRN
  Administered 2024-02-17: 30 mg via INTRAVENOUS

## 2024-02-17 MED ORDER — ACETAMINOPHEN 325 MG PO TABS
ORAL_TABLET | ORAL | Status: AC
  Filled 2024-02-17: qty 2

## 2024-02-17 MED ORDER — ETOMIDATE 2 MG/ML IV SOLN
INTRAVENOUS | Status: DC | PRN
  Administered 2024-02-17: 8 mg via INTRAVENOUS

## 2024-02-17 MED ORDER — HEPARIN (PORCINE) IN NACL 2000-0.9 UNIT/L-% IV SOLN
INTRAVENOUS | Status: DC | PRN
  Administered 2024-02-17: 1000 mL

## 2024-02-17 MED ORDER — SODIUM CHLORIDE 0.9 % IV SOLN: INTRAVENOUS | Status: DC | PRN

## 2024-02-17 MED ORDER — DEXAMETHASONE SODIUM PHOSPHATE 10 MG/ML IJ SOLN
INTRAMUSCULAR | Status: DC | PRN
  Administered 2024-02-17: 10 mg via INTRAVENOUS

## 2024-02-17 MED ORDER — CLOPIDOGREL BISULFATE 300 MG PO TABS
ORAL_TABLET | ORAL | Status: AC
Start: 2024-02-17 — End: 2024-02-17
  Filled 2024-02-17: qty 1

## 2024-02-17 MED ORDER — SODIUM CHLORIDE 0.9% FLUSH: 3.0000 mL | INTRAVENOUS | Status: DC | PRN

## 2024-02-17 MED ORDER — FENTANYL CITRATE (PF) 250 MCG/5ML IJ SOLN
INTRAMUSCULAR | Status: DC | PRN
  Administered 2024-02-17: 50 ug via INTRAVENOUS

## 2024-02-17 MED ORDER — ASPIRIN 325 MG PO TABS
ORAL_TABLET | ORAL | Status: AC
  Filled 2024-02-17: qty 1

## 2024-02-17 MED ORDER — FENTANYL CITRATE (PF) 100 MCG/2ML IJ SOLN
INTRAMUSCULAR | Status: DC | PRN
  Administered 2024-02-17 (×3): 12.5 ug via INTRAVENOUS

## 2024-02-17 MED ORDER — ALBUMIN HUMAN 5 % IV SOLN: INTRAVENOUS | Status: DC | PRN

## 2024-02-17 MED ORDER — CEFAZOLIN SODIUM-DEXTROSE 2-3 GM-%(50ML) IV SOLR
INTRAVENOUS | Status: DC | PRN
  Administered 2024-02-17: 2 g via INTRAVENOUS

## 2024-02-17 MED ORDER — IODIXANOL 320 MG/ML IV SOLN
INTRAVENOUS | Status: DC | PRN
Start: 2024-02-17 — End: 2024-02-17
  Administered 2024-02-17: 45 mL

## 2024-02-17 MED ORDER — HEPARIN SODIUM (PORCINE) 1000 UNIT/ML IJ SOLN
INTRAMUSCULAR | Status: DC | PRN
  Administered 2024-02-17: 8000 [IU] via INTRAVENOUS

## 2024-02-17 MED ORDER — STERILE WATER FOR INJECTION IV SOLN
INTRAVENOUS | Status: DC
  Filled 2024-02-17 (×2): qty 1000

## 2024-02-17 MED ORDER — SODIUM BICARBONATE 8.4 % IV SOLN
INTRAVENOUS | Status: DC | PRN
  Administered 2024-02-17: 50 meq via INTRAVENOUS

## 2024-02-17 MED ORDER — PHENYLEPHRINE 80 MCG/ML (10ML) SYRINGE FOR IV PUSH (FOR BLOOD PRESSURE SUPPORT)
PREFILLED_SYRINGE | INTRAVENOUS | Status: DC | PRN
  Administered 2024-02-17: 240 ug via INTRAVENOUS

## 2024-02-17 MED ORDER — CLOPIDOGREL BISULFATE 300 MG PO TABS
300.0000 mg | ORAL_TABLET | Freq: Once | ORAL | Status: DC
  Filled 2024-02-17: qty 1

## 2024-02-17 MED ORDER — SODIUM CHLORIDE 0.9% IV SOLUTION
Freq: Once | INTRAVENOUS | Status: DC
Start: 2024-02-17 — End: 2024-02-18

## 2024-02-17 MED ORDER — SODIUM CHLORIDE 0.9% FLUSH
3.0000 mL | Freq: Two times a day (BID) | INTRAVENOUS | Status: DC
  Administered 2024-02-18: 3 mL via INTRAVENOUS

## 2024-02-17 MED ORDER — ONDANSETRON HCL 4 MG/2ML IJ SOLN
INTRAMUSCULAR | Status: AC
  Filled 2024-02-17: qty 2

## 2024-02-17 MED ORDER — ASPIRIN 325 MG PO TABS
ORAL_TABLET | ORAL | Status: DC | PRN
  Administered 2024-02-17: 325 mg via ORAL

## 2024-02-17 MED ORDER — ONDANSETRON HCL 4 MG/2ML IJ SOLN
INTRAMUSCULAR | Status: DC | PRN
  Administered 2024-02-17: 4 mg via INTRAVENOUS

## 2024-02-17 MED ORDER — HEPARIN 6000 UNIT IRRIGATION SOLUTION
Status: AC
  Filled 2024-02-17: qty 500

## 2024-02-17 MED ORDER — LIDOCAINE HCL (PF) 1 % IJ SOLN
INTRAMUSCULAR | Status: AC
  Filled 2024-02-17: qty 30

## 2024-02-17 MED ORDER — SODIUM CHLORIDE 0.9 % WEIGHT BASED INFUSION: 1.0000 mL/kg/h | INTRAVENOUS | Status: DC

## 2024-02-17 MED ORDER — OXYCODONE-ACETAMINOPHEN 5-325 MG PO TABS
1.0000 | ORAL_TABLET | ORAL | Status: DC | PRN
  Administered 2024-02-17: 1 via ORAL
  Filled 2024-02-17: qty 1

## 2024-02-17 MED ORDER — DILTIAZEM HCL-DEXTROSE 125-5 MG/125ML-% IV SOLN (PREMIX)
5.0000 mg/h | INTRAVENOUS | Status: AC
  Administered 2024-02-17: 5 mg/h via INTRAVENOUS
  Filled 2024-02-17 (×2): qty 125

## 2024-02-17 MED ORDER — PHENYLEPHRINE HCL-NACL 20-0.9 MG/250ML-% IV SOLN
0.0000 ug/min | INTRAVENOUS | Status: DC
  Administered 2024-02-17: 20 ug/min via INTRAVENOUS

## 2024-02-17 MED ORDER — LACTATED RINGERS IV SOLN
INTRAVENOUS | Status: DC | PRN
Start: 2024-02-17 — End: 2024-02-17

## 2024-02-17 MED ORDER — HEPARIN 6000 UNIT IRRIGATION SOLUTION
Status: DC | PRN
Start: 2024-02-17 — End: 2024-02-17
  Administered 2024-02-17: 1

## 2024-02-17 MED ORDER — MIDAZOLAM HCL 2 MG/2ML IJ SOLN
INTRAMUSCULAR | Status: DC | PRN
  Administered 2024-02-17 (×2): .5 mg via INTRAVENOUS

## 2024-02-17 MED ORDER — FAMOTIDINE IN NACL 20-0.9 MG/50ML-% IV SOLN
20.0000 mg | Freq: Two times a day (BID) | INTRAVENOUS | Status: DC
  Administered 2024-02-18: 20 mg via INTRAVENOUS
  Filled 2024-02-17: qty 50

## 2024-02-17 MED ORDER — VASOPRESSIN 20 UNIT/ML IV SOLN
INTRAVENOUS | Status: AC
  Filled 2024-02-17: qty 1

## 2024-02-17 MED ORDER — CLOPIDOGREL BISULFATE 75 MG PO TABS: 75.0000 mg | ORAL_TABLET | Freq: Every day | ORAL | Status: DC

## 2024-02-17 MED ORDER — SUCCINYLCHOLINE CHLORIDE 200 MG/10ML IV SOSY
PREFILLED_SYRINGE | INTRAVENOUS | Status: DC | PRN
  Administered 2024-02-17: 160 mg via INTRAVENOUS

## 2024-02-17 MED ORDER — VASOPRESSIN 20 UNITS/100 ML INFUSION FOR SHOCK
0.0000 [IU]/min | INTRAVENOUS | Status: DC
  Administered 2024-02-17: .04 [IU]/min via INTRAVENOUS
  Administered 2024-02-18: 0.04 [IU]/min via INTRAVENOUS
  Administered 2024-02-18: 0.03 [IU]/min via INTRAVENOUS
  Filled 2024-02-17 (×3): qty 100

## 2024-02-17 MED ORDER — ASPIRIN 325 MG PO TBEC: 325.0000 mg | DELAYED_RELEASE_TABLET | Freq: Every day | ORAL | Status: DC

## 2024-02-17 MED ORDER — NOREPINEPHRINE 4 MG/250ML-% IV SOLN
INTRAVENOUS | Status: DC | PRN
  Administered 2024-02-17: 5 ug/min via INTRAVENOUS

## 2024-02-17 MED ORDER — SODIUM CHLORIDE 0.9 % IV BOLUS
500.0000 mL | Freq: Once | INTRAVENOUS | Status: AC
  Administered 2024-02-17: 500 mL via INTRAVENOUS

## 2024-02-17 MED ORDER — MIDAZOLAM HCL 2 MG/2ML IJ SOLN
INTRAMUSCULAR | Status: AC
  Filled 2024-02-17: qty 2

## 2024-02-17 MED ORDER — VASOPRESSIN 20 UNIT/ML IV SOLN
INTRAVENOUS | Status: DC | PRN
  Administered 2024-02-17: 1 [IU] via INTRAVENOUS
  Administered 2024-02-17 (×2): 3 [IU] via INTRAVENOUS
  Administered 2024-02-17: 4 [IU] via INTRAVENOUS
  Administered 2024-02-17: 1 [IU] via INTRAVENOUS
  Administered 2024-02-17: 3 [IU] via INTRAVENOUS
  Administered 2024-02-17: 4 [IU] via INTRAVENOUS
  Administered 2024-02-17: 3 [IU] via INTRAVENOUS
  Administered 2024-02-17: 1 [IU] via INTRAVENOUS

## 2024-02-17 MED ORDER — PAPAVERINE HCL 30 MG/ML IJ SOLN
INTRAMUSCULAR | Status: AC
  Filled 2024-02-17: qty 2

## 2024-02-17 MED ORDER — PROPOFOL 500 MG/50ML IV EMUL
INTRAVENOUS | Status: DC | PRN
  Administered 2024-02-17: 20 ug/kg/min via INTRAVENOUS

## 2024-02-17 MED ORDER — 0.9 % SODIUM CHLORIDE (POUR BTL) OPTIME
TOPICAL | Status: DC | PRN
  Administered 2024-02-17: 2000 mL

## 2024-02-17 MED ORDER — SODIUM CHLORIDE 0.9% IV SOLUTION: Freq: Once | INTRAVENOUS | Status: DC

## 2024-02-17 MED ORDER — PIPERACILLIN-TAZOBACTAM 3.375 G IVPB
3.3750 g | Freq: Three times a day (TID) | INTRAVENOUS | Status: DC
  Administered 2024-02-17 – 2024-02-18 (×2): 3.375 g via INTRAVENOUS
  Filled 2024-02-17 (×2): qty 50

## 2024-02-17 MED ORDER — LIDOCAINE 2% (20 MG/ML) 5 ML SYRINGE
INTRAMUSCULAR | Status: DC | PRN
  Administered 2024-02-17: 60 mg via INTRAVENOUS

## 2024-02-17 MED ORDER — CALCIUM CHLORIDE 10 % IV SOLN
INTRAVENOUS | Status: DC | PRN
  Administered 2024-02-17 (×2): 300 mg via INTRAVENOUS
  Administered 2024-02-17: 200 mg via INTRAVENOUS
  Administered 2024-02-17: 300 mg via INTRAVENOUS
  Administered 2024-02-17: 200 mg via INTRAVENOUS

## 2024-02-17 MED ORDER — CLOPIDOGREL BISULFATE 300 MG PO TABS
ORAL_TABLET | ORAL | Status: DC | PRN
  Administered 2024-02-17: 300 mg via ORAL

## 2024-02-17 MED ORDER — SODIUM CHLORIDE 0.9 % IV SOLN: 250.0000 mL | INTRAVENOUS | Status: DC | PRN

## 2024-02-17 MED ORDER — LIDOCAINE HCL (PF) 1 % IJ SOLN
INTRAMUSCULAR | Status: DC | PRN
  Administered 2024-02-17 (×2): 15 mL

## 2024-02-17 MED ORDER — LACTATED RINGERS IV BOLUS
1000.0000 mL | Freq: Once | INTRAVENOUS | Status: AC
  Administered 2024-02-17: 1000 mL via INTRAVENOUS

## 2024-02-17 MED ORDER — NOREPINEPHRINE 4 MG/250ML-% IV SOLN: 0.0000 ug/min | INTRAVENOUS | Status: DC

## 2024-02-17 MED ORDER — HEPARIN SODIUM (PORCINE) 1000 UNIT/ML IJ SOLN
INTRAMUSCULAR | Status: AC
  Filled 2024-02-17: qty 10

## 2024-02-17 MED ORDER — FENTANYL CITRATE (PF) 100 MCG/2ML IJ SOLN
INTRAMUSCULAR | Status: AC
Start: 2024-02-17 — End: 2024-02-17
  Filled 2024-02-17: qty 2

## 2024-02-17 MED ORDER — ARTIFICIAL TEARS OPHTHALMIC OINT: TOPICAL_OINTMENT | OPHTHALMIC | Status: DC | PRN

## 2024-02-17 MED ORDER — FENTANYL CITRATE (PF) 250 MCG/5ML IJ SOLN
INTRAMUSCULAR | Status: AC
  Filled 2024-02-17: qty 5

## 2024-02-17 MED ORDER — DILTIAZEM HCL-DEXTROSE 125-5 MG/125ML-% IV SOLN (PREMIX)
5.0000 mg/h | INTRAVENOUS | Status: DC
  Filled 2024-02-17: qty 125

## 2024-02-17 SURGICAL SUPPLY — 42 items
BAG COUNTER SPONGE SURGICOUNT (BAG) ×1 IMPLANT
BNDG ELASTIC 4X5.8 VLCR STR LF (GAUZE/BANDAGES/DRESSINGS) IMPLANT
CANISTER SUCTION 3000ML PPV (SUCTIONS) ×1 IMPLANT
CANISTER WOUND CARE 500ML ATS (WOUND CARE) IMPLANT
CLIP LIGATING EXTRA MED SLVR (CLIP) ×1 IMPLANT
CLIP LIGATING EXTRA SM BLUE (MISCELLANEOUS) ×1 IMPLANT
DERMABOND ADVANCED .7 DNX12 (GAUZE/BANDAGES/DRESSINGS) ×1 IMPLANT
DRAIN CHANNEL 19F RND (DRAIN) IMPLANT
DRAPE INCISE IOBAN 66X45 STRL (DRAPES) IMPLANT
DRAPE X-RAY CASS 24X20 (DRAPES) IMPLANT
DRESSING PEEL AND PLC PRVNA 13 (GAUZE/BANDAGES/DRESSINGS) IMPLANT
ELECTRODE REM PT RTRN 9FT ADLT (ELECTROSURGICAL) ×1 IMPLANT
EVACUATOR SILICONE 100CC (DRAIN) IMPLANT
GLOVE BIO SURGEON STRL SZ7.5 (GLOVE) ×1 IMPLANT
GOWN STRL REUS W/ TWL LRG LVL3 (GOWN DISPOSABLE) ×2 IMPLANT
GOWN STRL REUS W/ TWL XL LVL3 (GOWN DISPOSABLE) ×1 IMPLANT
HEMOSTAT SNOW SURGICEL 2X4 (HEMOSTASIS) IMPLANT
KIT BASIN OR (CUSTOM PROCEDURE TRAY) ×1 IMPLANT
KIT TURNOVER KIT B (KITS) ×1 IMPLANT
LOOP VESSEL MINI RED (MISCELLANEOUS) IMPLANT
MARKER GRAFT CORONARY BYPASS (MISCELLANEOUS) IMPLANT
NS IRRIG 1000ML POUR BTL (IV SOLUTION) ×2 IMPLANT
PACK PERIPHERAL VASCULAR (CUSTOM PROCEDURE TRAY) ×1 IMPLANT
PAD ARMBOARD POSITIONER FOAM (MISCELLANEOUS) ×2 IMPLANT
POWDER SURGICEL 3.0 GRAM (HEMOSTASIS) IMPLANT
SET COLLECT BLD 21X3/4 12 (NEEDLE) IMPLANT
SPONGE T-LAP 18X18 ~~LOC~~+RFID (SPONGE) IMPLANT
STAPLER SKIN PROX 35W (STAPLE) IMPLANT
STOPCOCK 4 WAY LG BORE MALE ST (IV SETS) IMPLANT
SUT ETHILON 3 0 PS 1 (SUTURE) IMPLANT
SUT PROLENE 5 0 C 1 24 (SUTURE) ×1 IMPLANT
SUT PROLENE 6 0 BV (SUTURE) ×1 IMPLANT
SUT PROLENE 6 0 C 1 24 (SUTURE) IMPLANT
SUT SILK 3-0 18XBRD TIE 12 (SUTURE) IMPLANT
SUT VIC AB 2-0 CT1 TAPERPNT 27 (SUTURE) ×2 IMPLANT
SUT VIC AB 3-0 SH 27X BRD (SUTURE) ×2 IMPLANT
SUT VIC AB 4-0 PS2 18 (SUTURE) ×2 IMPLANT
TOWEL GREEN STERILE (TOWEL DISPOSABLE) ×1 IMPLANT
TRAY FOLEY MTR SLVR 16FR STAT (SET/KITS/TRAYS/PACK) IMPLANT
TUBING EXTENTION W/L.L. (IV SETS) IMPLANT
UNDERPAD 30X36 HEAVY ABSORB (UNDERPADS AND DIAPERS) ×1 IMPLANT
WATER STERILE IRR 1000ML POUR (IV SOLUTION) ×1 IMPLANT

## 2024-02-17 NOTE — Transfer of Care (Signed)
 Immediate Anesthesia Transfer of Care Note  Patient: Tina Hester  Procedure(s) Performed: RIGHT FEMORAL ARTERY EXPLORATION (Right: Groin) APPLICATION, PREVENA WOUND VAC (Right: Groin) RIGHT FEMORAL ARTERY REPAIR (Right: Groin)  Patient Location: ICU  Anesthesia Type:General  Level of Consciousness: Patient remains intubated per anesthesia plan  Airway & Oxygen Therapy: Patient remains intubated per anesthesia plan and Patient placed on Ventilator (see vital sign flow sheet for setting)  Post-op Assessment: Report given to RN and Pt critically ill going to ICU on vasopressin and Diltizem gtts.   Post vital signs: Reviewed  Last Vitals:  Vitals Value Taken Time  BP 143/50 02/17/24 21:11  Temp 35.1 C 02/17/24 21:11  Pulse 90 02/17/24 21:11  Resp 18 02/17/24 21:11  SpO2    Vitals shown include unfiled device data.  Last Pain:  Vitals:   02/17/24 1729  TempSrc:   PainSc: 10-Worst pain ever      Patients Stated Pain Goal: 0 (02/16/24 1759)  Complications: No notable events documented.

## 2024-02-17 NOTE — Progress Notes (Addendum)
 Arrived from Cath Lab Holding: Vital signs taken by cathlab holding RN @ 1645 This RN arrived, pt c/o severe pain in groin.  Noted large hematoma R groin.  Began holding pressure @1550  Cath lab notified @ 1553 MD paged.  On the way @1558  Cath Lab staff x 3 at bed side.   Pressure held until femstop applied @ 1650 @ MD verbal order: if hematoma stable continue to keep flat until am Hematoma progressing follow removal of femstop.  Cath lab called MD New orders to reapply femstop @ 1550 AT x 30 mins.

## 2024-02-17 NOTE — Progress Notes (Signed)
 Femstop reapplied at 25mmHg per on call MD

## 2024-02-17 NOTE — Progress Notes (Signed)
 Site area: Right groin Site Prior to Removal:  Level 0 Pressure Applied For:25 minutes Manual:   Yes Patient Status During Pull:  stable Post Pull Site:  Level 0 Post Pull Instructions Given:  Yes Post Pull Pulses Present: Bilateral Dp's Doppler Dressing Applied:  Gauze and tegaderm Bedrest begins @ 1500 Comments:

## 2024-02-17 NOTE — Progress Notes (Signed)
 Femstop reapplied per MD until arrival of OR at 

## 2024-02-17 NOTE — Progress Notes (Signed)
 PROGRESS NOTE    Tina Hester  OZH:[id number] DOB: 04-10-1941 DOA: 02/14/2024 PCP: Olin Bertin, MD    Brief Narrative:  83 year old with history of DM2, hypothyroidism, HTN, HLD, dementia, PAD, bilateral carotid artery stenosis, aortic stenosis, OSA on CPAP, persistent A-fib/flutter on Eliquis , three-vessel CAD comes to the hospital with right great toe erythema and pain along with some drainage.  Podiatry recommended transferring patient to [facility].  MRI of the foot is consistent with osteomyelitis/septic arthritis.  ABIs were abnormal.  Right lower extremity angiogram today.   Assessment & Plan:  Principal Problem:   Cellulitis of right foot     Diabetic right great toe wound infection with cellulitis and concern for osteomyelitis MRI of the foot confirms osteomyelitis/septic arthritis.  Currently on IV Unasyn and linezolid.  Podiatry and vascular consulted - Abnormal ABIs, planning angiogram today.  Eventually likely will need amputation.   Diabetes mellitus type 2 with hyperglycemia Peripheral neuropathy Sliding scale and Accu-Cheks Gabapentin    Persistent atrial fibrillation/flutter Rate controlled Eliquis  on hold in anticipation for surgery   CKD stage IIIb - Creatinine stable.  Monitor.  Creatinine around baseline 1.3   Anemia of chronic disease Hemoglobin stable around 10.3   Hypertension hyperlipidemia - Continue statin, ezetimibe , beta-blocker and ARB   Dementia - Fall precautions.  Monitor mental status.  Aricept    History of three-vessel CAD - No chest pain.  Continue beta-blocker and ARB.  Hold Plavix  - Zetia    Hypothyroidism Synthroid    Goals of care - Consult palliative care for goals of care discussion.  Currently full code.      DVT prophylaxis: enoxaparin (LOVENOX) injection 40 mg Start: 02/14/24 2200    Code Status: Full Code Family Communication:   Final plan still to be determined   Subjective: Planned  angiogram Examination:  General exam: Appears calm and comfortable  Respiratory system: Clear to auscultation. Respiratory effort normal. Cardiovascular system: S1 & S2 heard, RRR. No JVD, murmurs, rubs, gallops or clicks. No pedal edema. Gastrointestinal system: Abdomen is nondistended, soft and nontender. No organomegaly or masses felt. Normal bowel sounds heard. Central nervous system: Alert and oriented. No focal neurological deficits. Extremities: Symmetric 5 x 5 power. Skin: No rashes, lesions or ulcers Psychiatry: Judgement and insight appear normal. Mood & affect appropriate.                Diet Orders (From admission, onward)     Start     Ordered   02/17/24 1049  Diet regular Room service appropriate? Yes; Fluid consistency: Thin  Diet effective now       Question Answer Comment  Room service appropriate? Yes   Fluid consistency: Thin      02/17/24 1048            Objective: Vitals:   02/17/24 1045 02/17/24 1100 02/17/24 1115 02/17/24 1130  BP: (!) 124/49 (!) 110/58 (!) 135/46 (!) 130/115  Pulse: 96 88 86 84  Resp: (!) 27 15 16 15   Temp:      TempSrc:      SpO2: 98% 93% 97% 96%  Weight:        Intake/Output Summary (Last 24 hours) at 02/17/2024 1254 Last data filed at 02/16/2024 1726 Gross per 24 hour  Intake 465.27 ml  Output --  Net 465.27 ml   Filed Weights   02/14/24 1400  Weight: 78.8 kg    Scheduled Meds:  acetaminophen        [START ON 02/01/2024] aspirin  EC  325 mg Oral Daily   [MAR Hold] collagenase   Topical Daily   [MAR Hold] donepezil   5 mg Oral Daily   [MAR Hold] enoxaparin (LOVENOX) injection  40 mg Subcutaneous QHS   [MAR Hold] ezetimibe   10 mg Oral QPM   [MAR Hold] gabapentin   300 mg Oral QHS   [MAR Hold] insulin  aspart  0-15 Units Subcutaneous TID WC   [MAR Hold] insulin  aspart  0-5 Units Subcutaneous QHS   [MAR Hold] irbesartan   150 mg Oral Daily   [MAR Hold] levothyroxine   150 mcg Oral QAC breakfast   [MAR Hold]  linezolid  600 mg Oral Q12H   [MAR Hold] metoprolol  succinate  25 mg Oral BID   ondansetron        [MAR Hold] rosuvastatin   20 mg Oral QHS   [MAR Hold] sertraline   50 mg Oral Daily   Continuous Infusions:  sodium chloride  1 mL/kg/hr (02/17/24 1100)   [MAR Hold] ampicillin-sulbactam (UNASYN) IV 3 g (02/17/24 0514)    Nutritional status     Body mass index is 33.93 kg/m.  Data Reviewed:   CBC: Recent Labs  Lab 02/14/24 1351 02/15/24 0552 02/16/24 0538 02/17/24 0604  WBC 9.0 8.8 7.7 7.6  NEUTROABS 5.9  --   --   --   HGB 10.8* 10.3* 10.4* 10.0*  HCT 35.0* 33.6* 33.3* 32.6*  MCV 87.9 86.2 86.3 87.4  PLT 188 175 168 175   Basic Metabolic Panel: Recent Labs  Lab 02/14/24 1351 02/15/24 0552 02/16/24 0538 02/17/24 0604  NA 133* 137 137 140  K 3.8 4.3 3.9 3.7  CL 102 109 109 110  CO2 22 18* 19* 20*  GLUCOSE 207* 100* 124* 103*  BUN 23 19 21 21   CREATININE 1.32* 1.32* 1.47* 1.54*  CALCIUM  8.4* 8.1* 8.1* 8.2*  MG  --  1.9 1.8 1.8   GFR: Estimated Creatinine Clearance: 25.7 mL/min (A) (by C-G formula based on SCr of 1.54 mg/dL (H)). Liver Function Tests: Recent Labs  Lab 02/15/24 0552  AST 43*  ALT 29  ALKPHOS 71  BILITOT 0.8  PROT 5.9*  ALBUMIN 2.4*   No results for input(s): LIPASE, AMYLASE in the last 168 hours. No results for input(s): AMMONIA in the last 168 hours. Coagulation Profile: No results for input(s): INR, PROTIME in the last 168 hours. Cardiac Enzymes: No results for input(s): CKTOTAL, CKMB, CKMBINDEX, TROPONINI in the last 168 hours. BNP (last 3 results) No results for input(s): PROBNP in the last 8760 hours. HbA1C: No results for input(s): HGBA1C in the last 72 hours. CBG: Recent Labs  Lab 02/16/24 1215 02/16/24 1740 02/16/24 2111 02/17/24 0626 02/17/24 1040  GLUCAP 116* 156* 112* 101* 132*   Lipid Profile: No results for input(s): CHOL, HDL, LDLCALC, TRIG, CHOLHDL, LDLDIRECT in the last 72  hours. Thyroid  Function Tests: No results for input(s): TSH, T4TOTAL, FREET4, T3FREE, THYROIDAB in the last 72 hours. Anemia Panel: No results for input(s): VITAMINB12, FOLATE, FERRITIN, TIBC, IRON, RETICCTPCT in the last 72 hours. Sepsis Labs: No results for input(s): PROCALCITON, LATICACIDVEN in the last 168 hours.  Recent Results (from the past 240 hours)  Blood culture (routine x 2)     Status: None (Preliminary result)   Collection Time: 02/14/24  1:51 PM   Specimen: BLOOD  Result Value Ref Range Status   Specimen Description   Final    BLOOD BLOOD RIGHT ARM Performed at [facility], [address] [place], [place] [address]    Special  Requests   Final    BOTTLES DRAWN AEROBIC AND ANAEROBIC Blood Culture adequate volume Performed at [facility], [address] [place], [place] [address]    Culture   Final    NO GROWTH 3 DAYS Performed at [facility], [address], [place], [place] [address]    Report Status PENDING  Incomplete  Blood culture (routine x 2)     Status: None (Preliminary result)   Collection Time: 02/14/24  1:51 PM   Specimen: BLOOD  Result Value Ref Range Status   Specimen Description   Final    BLOOD BLOOD LEFT ARM Performed at [facility], [address], [place], [place] [address]    Special Requests   Final    BOTTLES DRAWN AEROBIC AND ANAEROBIC Blood Culture adequate volume Performed at [facility], [address] [place], [place] [address]    Culture   Final    NO GROWTH 3 DAYS Performed at [facility], [address], [place], [place] [address]    Report Status PENDING  Incomplete  MRSA Next Gen by PCR, Nasal     Status: None   Collection Time: 02/16/24  1:14 PM   Specimen: Nasal Mucosa; Nasal Swab  Result Value Ref Range Status   MRSA by PCR Next Gen NOT DETECTED NOT DETECTED Final    Comment: (NOTE) The GeneXpert  MRSA Assay (FDA approved for NASAL specimens only), is one component of a comprehensive MRSA colonization surveillance program. It is not intended to diagnose MRSA infection nor to guide or monitor treatment for MRSA infections. Test performance is not FDA approved in patients less than 38 years old. Performed at [facility], [address], [place], [place] [address]          Radiology Studies: No results found.         LOS: 3 days   Time spent= 35 mins    Maggie Schooner, MD [facility]  If 7PM-7AM, please contact night-coverage  02/17/2024, 12:54 PM

## 2024-02-17 NOTE — Progress Notes (Signed)
 Called to the floor to assess hematoma. Upon arrival nurse was holding pressure and notified us  that she paged Dr. Edgardo Goodwill. Paulette, RN and I took over holding pressure until MD arrived at bedside. Zach, EMTP arrived at bedside as well. Pt reporting chest pain, shortness of breath and rib pain and needing to have a bowel movement. MD aware. Pressure held for approximately 25 minutes and fem-stop applied per MD order. Fem-stop applied over marked location per Dr. Edgardo Goodwill following his assessment via ultrasound. Fem-stop deflated and removed per order protocol. PT pulse in right foot was dopplerable throughout encounter and repeatedly assessed throughout fem-stop deflation. Hematoma appeared to be growing in size and measuring 16cm x 25cm. Dr. Vikki Graves was paged and gave orders to re-inflate fem-stop to 30 mmHg for 30 minutes. Dr. Vikki Graves also made aware that patient was complaining of shortness of breath and chest pain. PT pulse still present with doppler. Patients nurse Moira Andrews was present throughout.

## 2024-02-17 NOTE — Progress Notes (Addendum)
 eLink Physician-Brief Progress Note Patient Name: [name] DOB: May 12, 1941 MRN: [id number]   Date of Service  02/17/2024  HPI/Events of Note  Anesthesiologist hand off: Notified ground team of admission to ICU.  In brief:  83 yr old with hx of advanced dementia, a fib, PAD, severe AS, CAD-not a candidate for CABG or PCI , who initially presented 6/17 with R great toe increased pain and redness concerning for osteomyelitis. She was seen by vascular surgery and underwent an angiogram with Dr. Magda, developed a post-op hematoma that required exploration, repair and wound vac placement. was taken to OR for exploration for femoral arterial bleeding-anemia. went into a fib RVR, briefly was on pressors, now on cardizem  drip, severe metabolic acidosis, hyperkalemia. received hyperkalemia treatment. she is DNR. s/p 2prbc   Camera: Discussed with RN. In sync with vent 360/01/14/49%. MAP > 70. On vasopressin . On cardizem  drip, HR 89. Do not have NG tube. Received bicarb once from OT for hyperkalemia. Glucose 125.  Data: labs from AM reviewed. Stat labs ordered.      eICU Interventions  Ground team notified. Follow stat labs. NG ordered, follow KUB.      Intervention Category Major Interventions: Respiratory failure - evaluation and management;Acid-Base disturbance - evaluation and management;Arrhythmia - evaluation and management Minor Interventions: Communication with other healthcare providers and/or family Evaluation Type: New Patient Evaluation  Tina Hester 02/17/2024, 9:25 PM  22:00 Stat ABG showing still severe metabolic acidosis at 6.9, pco2 at 26, pao2 at 551. Bicarb 6.3. potassium at 6.3 from ABG reports. Hg 8.5, stable.  Camera: Discussed with RN. Ground team is ordering meds as we speak.  22:55  LA elevated > 8. Discussed with RN. CxR no obvious CHF or effusion.  CMP stat is pending. UOP 75. - getting a lit of bolus. Follow LA, UOP. Keep MAP >  65.  23:24 Camera alert to room, MAP still soft, asking for second pressor. Finishing 1 lit of LR, art line wave form showing wide pressure fluctuations, suggestive of volume down still. On bicarb drip now. - saline 500 ml bolus, has severe AS, watch for CHF -fluid overload closely. Would avoid tachycardia. So placing neo synephrine instead of levophed  to keep MAP > 65.  00:20 Camera high alert: Got hypoxic to 70's, now on 100% fio2, sats 99%. RT at bed side. Getting bicarb drip. No secretions. - high risk for flash pulmonary edema from her severe AS, fluids. On neo at 90 ml/hr and bicarb drip. Do not have a central line. - stat CxR, ABG, NT BNP, troponin. . - notified ground team for CVL placement  00:48 ABG: P/F ratio 410, wean fio2 as tolerated, persisting severe metabolic acidosis. Hco3 at 8 on bicarb drip. Hg from ABG < 7. Get stat Hg/Hct. CxR not done yet.

## 2024-02-17 NOTE — Anesthesia Procedure Notes (Signed)
 Arterial Line Insertion Start/End6/20/2025 7:49 PM, 02/17/2024 7:50 PM Performed by: Peggy Bowens, MD, Andee Bamberger, CRNA, CRNA  Patient location: OR. Preanesthetic checklist: patient identified, IV checked, site marked, risks and benefits discussed, surgical consent, monitors and equipment checked, pre-op evaluation, timeout performed and anesthesia consent Left, radial was placed Catheter size: 20 G Hand hygiene performed , maximum sterile barriers used  and Seldinger technique used Allen's test indicative of satisfactory collateral circulation Attempts: 1 Procedure performed using ultrasound guided technique. Ultrasound Notes:anatomy identified, needle tip was noted to be adjacent to the nerve/plexus identified and no ultrasound evidence of intravascular and/or intraneural injection Following insertion, Biopatch. Post procedure assessment: normal  Patient tolerated the procedure well with no immediate complications.

## 2024-02-17 NOTE — Progress Notes (Signed)
 Arrived to 4E to remove femostop after 30 minutes per Dr. Vikki Graves. Femostop removed. Hematoma still present and firm. Hematoma did not appear to be growing in size. Patient still complaining of rib pain, SOB, and nausea. Dr. Vikki Graves made aware. Dr. Vikki Graves advised to place femostop and he would come to evaluate the patient. Dr. Vikki Graves made aware femostop replaced at and distal pulse still present via doppler.

## 2024-02-17 NOTE — Anesthesia Procedure Notes (Addendum)
 Procedure Name: Intubation Date/Time: 02/17/2024 7:50 PM  Performed by: Melinda Sprawls, CRNAPre-anesthesia Checklist: Patient identified, Emergency Drugs available, Suction available, Patient being monitored and Timeout performed Patient Re-evaluated:Patient Re-evaluated prior to induction Oxygen Delivery Method: Circle system utilized Preoxygenation: Pre-oxygenation with 100% oxygen Induction Type: IV induction, Rapid sequence and Cricoid Pressure applied Laryngoscope Size: Mac and 3 Grade View: Grade I Tube type: Oral Tube size: 7.5 mm Number of attempts: 1 Airway Equipment and Method: Stylet Placement Confirmation: ETT inserted through vocal cords under direct vision, positive ETCO2 and breath sounds checked- equal and bilateral Secured at: 22 cm Tube secured with: Tape Dental Injury: Teeth and Oropharynx as per pre-operative assessment  Comments: Smooth IV Induction. Eyes taped. RSI Performed. DL x 1 with grade 1 view. Atraumatically placed, teeth and lip remain intact as pre-op. Secured with tape. Bilateral breath sounds +/=, EtCO2 +, Adequate TV, VSS.

## 2024-02-17 NOTE — Op Note (Signed)
 DATE OF SERVICE: 02/17/2024  PATIENT:  Tina Hester  83 y.o. female  PRE-OPERATIVE DIAGNOSIS:  Atherosclerosis of native arteries of right lower extremity causing ulcer with osteomyelitis of great toe  POST-OPERATIVE DIAGNOSIS:  Same  PROCEDURE:   1) Ultrasound guided right common femoral antegrade access (CPT [phone]) 2) Right lower extremity angiogram with first order cannulation (CPT [phone]) 3) Right behind-the-knee popliteal artery angioplasty (5x34mm Sterling) (CPT 37224) 4) Right tibioperoneal trunk angioplasty (4x163mm Sterling) (CPT [phone]) 5) Right posterior tibial angioplasty (3x216mm Sterling) (CPT 37232) 6) Conscious sedation (43 minutes) (CPT 99152) 7) Inpatient care (CPT [phone])  SURGEON:  Heber Little. Edgardo Goodwill, MD  ASSISTANT: none  ANESTHESIA:   local and IV sedation  ESTIMATED BLOOD LOSS: minimal  LOCAL MEDICATIONS USED:  LIDOCAINE    COUNTS: confirmed correct.  PATIENT DISPOSITION:  PACU - hemodynamically stable.   Delay start of Pharmacological VTE agent (>24hrs) due to surgical blood loss or risk of bleeding: no  INDICATION FOR PROCEDURE: Tina Hester is a 83 y.o. female with right great toe osteomyelitis and non-invasive evidence of peripheral arterial disease. After careful discussion of risks, benefits, and alternatives the patient was offered angiogram.   OPERATIVE FINDINGS:  Right lower extremity: Common femoral artery patent Profunda femoris artery patent Superficial femoral artery patent. Prior femoral-popliteal artery stenting widely patent Below the femoral-popliteal artery stent, the behind the popliteal artery has significant stenosis (about 70%) Tibioperoneal trunk has significant stenosis (about 70%) Posterior tibial artery has multifocal critical stenosis (greatest 90%) Peroneal artery patent to the foot Anterior tibial artery occluded Pedal vessels better than expected, but moderately diseased  GLASS score.  FP 4. IP 3. Stage  III.  WIfI score. 1 / 3 / 2. Stage IV.  DESCRIPTION OF PROCEDURE: After identification of the patient in the pre-operative holding area, the patient was transferred to the operating room. The patient was positioned supine on the operating room table. Anesthesia was induced. The groins was prepped and draped in standard fashion. A surgical pause was performed confirming correct patient, procedure, and operative location.  The right groin was anesthetized with subcutaneous injection of 1% lidocaine . Using ultrasound guidance, the right common femoral artery was accessed with micropuncture technique in antegrade fashion. Fluoroscopy was used to confirm cannulation over the femoral head. A sheath injection was performed. The 88F micropuncture sheath was upsized to 42F.   Direct injections into the superficial femoral artery through the micro sheath were used to perform right lower extremity angiography in stations.  See above for details.  The decision was made to intervene. The patient was heparinized with 8000 units of heparin . The 42F sheath was exchanged for a 5 [place] by 20 cm sheath. Selective angiography of the right lower extremity extremity performed prior to intervention.   The lesions were treated with: Right behind-the-knee popliteal artery angioplasty (5x24mm Sterling)  Right tibioperoneal trunk angioplasty (4x148mm Sterling) Right posterior tibial angioplasty (3x268mm Sterling)  Completion angiography revealed:  Resolution of stenosis below the knee with improved flow into the foot  The sheath was left in place to be removed in the recovery area.  Conscious sedation was administered with the use of IV fentanyl  and midazolam  under continuous physician and nurse monitoring.  Heart rate, blood pressure, and oxygen saturation were continuously monitored.  Total sedation time was 43 minutes  Upon completion of the case instrument and sharps counts were confirmed correct. The patient was  transferred to the PACU in good condition. I was present for all portions of  the procedure.  PLAN: ASA / Plavix  / Statin during periop period.   Resume Eliquis  and stop Plavix  once all operative interventions have been completed.   Need to engage cardiology to re-evaluate cardiac risk from LCFA endarterectomy now that she is several months past NSTEMI.  Heber Little. Edgardo Goodwill, MD [facility] Phone Number: [phone] 02/17/2024 9:54 AM

## 2024-02-17 NOTE — Progress Notes (Signed)
 Dr. Vikki Graves arrived, femostop removed for him to assess. Site assessed by Dr. Vikki Graves and femostop re placed per Dr. Vikki Graves. OR team will remove. Placed at 25mmhg. Distal pulse present doppler. Museum/gallery curator at bedside.

## 2024-02-17 NOTE — Progress Notes (Signed)
    I was called bedside to evaluate continued hematoma and hypotension despite 2 attempts at FemoStop.  I discussed with the patient and family at bedside that any further drops in blood pressure would merit hematoma evacuation and femoral repair but that surgery will have very high risk of wound complication.  As we are discussing the patient her next bone pressure was 84/43 which was a 25 mmHg drop in systolic pressure from the prior check.  Most recent H&H was actually stable given the hypotension and concerning appearance of the right groin we have elected for surgical intervention.  We discussed that she will likely require continued intubation with ICU observation overnight.  Again we discussed the risks of wound healing particularly given the size of her groin and the hematoma with the thin skin from FemoStop placement.  Family demonstrates good understanding, patient with known dementia.  Consent signed at bedside and will proceed urgently to OR.  Jackalyn Haith C. Vikki Graves, MD [facility] [place] Office: [phone] Pager: [phone]

## 2024-02-17 NOTE — Consult Note (Addendum)
 NAME:  Tina Hester, MRN:  [id number], DOB:  [birth year], LOS: 3 ADMISSION DATE:  02/14/2024, CONSULTATION DATE:  02/17/24 REFERRING MD:  Vikki Graves , CHIEF COMPLAINT:  hypotension   History of Present Illness:  Tina Hester is a 83 y.o. F with PMH significant for CAD and NSTEMI 10/2023 with multivessel stenosis and felt not to be a candidate for CABG  or PCI and severe AS, Atrial fibrillation on Eliquis  and Plavix , OSA Type 2 DM, HTN,  PAD who initially presented 6/17 with R great toe increased pain and redness concerning for osteomyelitis.  She was seen by vascular surgery and underwent an angiogram with Dr. Edgardo Goodwill, developed a post-op hematoma  that required exploration, repair and wound vac placement.  Pt was somewhat hypotensive and required vasopressin and two units of PRBC's.  She was also left intubated, PCCM consulted in this setting.   Pertinent  Medical History   has a past medical history of Anemia, Anxiety, Aortic stenosis, Bilateral carotid artery stenosis (06/08/2015), Bradycardia, CKD (chronic kidney disease), stage III (HCC), Diabetes mellitus without complication (HCC), GERD (gastroesophageal reflux disease), Gout, HSV-1 (herpes simplex virus 1) infection, Hyperkalemia, Hyperlipidemia, Hypertension, Joint pain, MCI (mild cognitive impairment), Morbid obesity (HCC), OSA on CPAP, Osteopenia, PAF (paroxysmal atrial fibrillation) (HCC), Pain management, Pulmonary HTN (HCC), and TIA (transient ischemic attack).   Significant Hospital Events: Including procedures, antibiotic start and stop dates in addition to other pertinent events   6/17 presented with osteo 6/20 underwent L femoral angiogram with subsequent hematoma requiring exploration, left intubated and sedated   Interim History / Subjective:  Pt transferred to the ICU intubated and sedated   Objective    Blood pressure (!) 87/32, pulse 75, temperature (!) 95.2 F (35.1 C), resp. rate 18, weight 78.8 kg, SpO2 100%.         Intake/Output Summary (Last 24 hours) at 02/17/2024 2131 Last data filed at 02/17/2024 2101 Gross per 24 hour  Intake 2668 ml  Output 150 ml  Net 2518 ml   Filed Weights   02/14/24 1400  Weight: 78.8 kg    General:  well nourished, elderly F critically ill intubated and sedated  HEENT: MM pink/moist, R iris clouded  Neuro: examined on propofol, RASS -2,  CV: s1s2 irregular, no m/r/g PULM:  mechanically ventilated, mildly diminished LLL, no rhonchi or wheezing  GI: soft, non-distended  Extremities: warm/dry, R thigh hematoma marked, compartment is soft, peripheral pulses weakly dopplerable bilaterally   Resolved problem list   Assessment and Plan   R great toe osteomyelitis s/p endovascular revascularization of RLE  Post-op hematoma requiring exploration and placement of wound VAC  Shock- likely both hemorrhagic and septic   RLE cellulitis and osteomyelitis  -three stents placed, management per vascular -monitor hematoma size, transfused 2 units PRBC's, follow CBC and transfuse <7 -Zosyn, Linezolid, no growth on cultures to date, continue to follow -vasopressin to maintain MAP >65 -1L IVF now, trend lactic -hold plavix  load tonight, d/w Dr. Vikki Graves    Post-op ventilator management OSA on CPAP  -propofol and fentanyl  for PAD protocol --Maintain full vent support with SAT/SBT as tolerated -titrate Vent setting to maintain SpO2 greater than or equal to 90%. -HOB elevated 30 degrees. -Plateau pressures less than 30 cm H20.  -Follow chest x-ray, ABG prn.   -Bronchial hygiene and RT/bronchodilator protocol.   Metabolic Acidosis Repeat labs pending -check lactic acid, pH 6.9, rr rate increased,, bicarb gtt     Baseline CAD, AS and PAF Cardiology is  following  -cardizem gtt weaned off, HR controlled  -hold home anticoagulation and plavix  in the setting of hematoma  -hold home bb    Type  2 DM -SSI   Hypothyroidism -continue synthroid     CKD stage III -CMP  pending, follow renal indices, avoid nephrotoxins and monitor UOP  Best Practice (right click and Reselect all SmartList Selections daily)   Diet/type: NPO DVT prophylaxis SCD Pressure ulcer(s): N/A GI prophylaxis: H2B Lines: Arterial Line Foley:  Yes, and it is still needed Code Status:  DNR Last date of multidisciplinary goals of care discussion [per primary team]  Labs   CBC: Recent Labs  Lab 02/14/24 1351 02/15/24 0552 02/16/24 0538 02/17/24 0604 02/17/24 1643 02/17/24 1847 02/17/24 1958 02/17/24 2043  WBC 9.0 8.8 7.7 7.6 9.5 12.9*  --   --   NEUTROABS 5.9  --   --   --   --   --   --   --   HGB 10.8* 10.3* 10.4* 10.0* 9.9* 9.4* 8.2* 8.5*  HCT 35.0* 33.6* 33.3* 32.6* 32.1* 31.7* 24.0* 25.0*  MCV 87.9 86.2 86.3 87.4 88.7 90.3  --   --   PLT 188 175 168 175 225 286  --   --     Basic Metabolic Panel: Recent Labs  Lab 02/14/24 1351 02/15/24 0552 02/16/24 0538 02/17/24 0604 02/17/24 1958 02/17/24 2043  NA 133* 137 137 140 141 141  K 3.8 4.3 3.9 3.7 5.3* 6.3*  CL 102 109 109 110  --   --   CO2 22 18* 19* 20*  --   --   GLUCOSE 207* 100* 124* 103*  --   --   BUN 23 19 21 21   --   --   CREATININE 1.32* 1.32* 1.47* 1.54*  --   --   CALCIUM  8.4* 8.1* 8.1* 8.2*  --   --   MG  --  1.9 1.8 1.8  --   --    GFR: Estimated Creatinine Clearance: 25.7 mL/min (A) (by C-G formula based on SCr of 1.54 mg/dL (H)). Recent Labs  Lab 02/16/24 0538 02/17/24 0604 02/17/24 1643 02/17/24 1847  WBC 7.7 7.6 9.5 12.9*    Liver Function Tests: Recent Labs  Lab 02/15/24 0552  AST 43*  ALT 29  ALKPHOS 71  BILITOT 0.8  PROT 5.9*  ALBUMIN 2.4*   No results for input(s): LIPASE, AMYLASE in the last 168 hours. No results for input(s): AMMONIA in the last 168 hours.  ABG    Component Value Date/Time   PHART 6.970 (LL) 02/17/2024 2043   PCO2ART 26.5 (L) 02/17/2024 2043   PO2ART 551 (H) 02/17/2024 2043   HCO3 6.3 (L) 02/17/2024 2043   TCO2 7 (L) 02/17/2024 2043    ACIDBASEDEF 24.0 (H) 02/17/2024 2043   O2SAT 100 02/17/2024 2043     Coagulation Profile: No results for input(s): INR, PROTIME in the last 168 hours.  Cardiac Enzymes: No results for input(s): CKTOTAL, CKMB, CKMBINDEX, TROPONINI in the last 168 hours.  HbA1C: Hemoglobin A1C  Date/Time Value Ref Range Status  08/17/2022 12:00 AM 8.7  Final   Hgb A1c MFr Bld  Date/Time Value Ref Range Status  11/11/2023 03:02 AM 7.3 (H) 4.8 - 5.6 % Final    Comment:    (NOTE) Pre diabetes:          5.7%-6.4%  Diabetes:              >6.4%  Glycemic control for   <  7.0% adults with diabetes   05/06/2023 10:42 PM 8.0 (H) 4.8 - 5.6 % Final    Comment:    (NOTE) Pre diabetes:          5.7%-6.4%  Diabetes:              >6.4%  Glycemic control for   <7.0% adults with diabetes     CBG: Recent Labs  Lab 02/16/24 1740 02/16/24 2111 02/17/24 0626 02/17/24 1040 02/17/24 2111  GLUCAP 156* 112* 101* 132* 121*    Review of Systems:   Unable to obtain   Past Medical History:  She,  has a past medical history of Anemia, Anxiety, Aortic stenosis, Bilateral carotid artery stenosis (06/08/2015), Bradycardia, CKD (chronic kidney disease), stage III (HCC), Diabetes mellitus without complication (HCC), GERD (gastroesophageal reflux disease), Gout, HSV-1 (herpes simplex virus 1) infection, Hyperkalemia, Hyperlipidemia, Hypertension, Joint pain, MCI (mild cognitive impairment), Morbid obesity (HCC), OSA on CPAP, Osteopenia, PAF (paroxysmal atrial fibrillation) (HCC), Pain management, Pulmonary HTN (HCC), and TIA (transient ischemic attack).   Surgical History:   Past Surgical History:  Procedure Laterality Date   ABDOMINAL AORTOGRAM W/LOWER EXTREMITY N/A 11/11/2023   Procedure: ABDOMINAL AORTOGRAM W/LOWER EXTREMITY;  Surgeon: Carlene Che, MD;  Location: [facility] INVASIVE CV LAB;  Service: Cardiovascular;  Laterality: N/A;   ABDOMINAL HYSTERECTOMY     APPENDECTOMY     BACK SURGERY      cervical fusion   BLADDER SURGERY     CESAREAN SECTION     CHOLECYSTECTOMY     LEFT HEART CATH AND CORONARY ANGIOGRAPHY N/A 11/10/2023   Procedure: LEFT HEART CATH AND CORONARY ANGIOGRAPHY;  Surgeon: Odie Benne, MD;  Location: [facility] INVASIVE CV LAB;  Service: Cardiovascular;  Laterality: N/A;   LOWER EXTREMITY ANGIOGRAPHY N/A 02/25/2023   Procedure: Lower Extremity Angiography;  Surgeon: Carlene Che, MD;  Location: [facility] INVASIVE CV LAB;  Service: Cardiovascular;  Laterality: N/A;   LOWER EXTREMITY ANGIOGRAPHY N/A 11/11/2023   Procedure: Lower Extremity Angiography;  Surgeon: Carlene Che, MD;  Location: [facility] INVASIVE CV LAB;  Service: Cardiovascular;  Laterality: N/A;   LOWER EXTREMITY INTERVENTION N/A 11/11/2023   Procedure: LOWER EXTREMITY INTERVENTION;  Surgeon: Carlene Che, MD;  Location: [facility] INVASIVE CV LAB;  Service: Cardiovascular;  Laterality: N/A;   PERIPHERAL VASCULAR INTERVENTION  02/25/2023   Procedure: PERIPHERAL VASCULAR INTERVENTION;  Surgeon: Carlene Che, MD;  Location: [facility] INVASIVE CV LAB;  Service: Cardiovascular;;     Social History:   reports that she has never smoked. She has never used smokeless tobacco. She reports that she does not drink alcohol and does not use drugs.   Family History:  Her family history includes Arrhythmia in her brother.   Allergies Allergies  Allergen Reactions   Pneumococcal Vaccine Anaphylaxis    weakness   Sulfa Antibiotics Hives   Pregabalin Other (See Comments)    Other reaction(s): depression, patient unsure     Home Medications  Prior to Admission medications   Medication Sig Start Date End Date Taking? Authorizing Provider  acetaminophen  (TYLENOL ) 325 MG tablet Take 650 mg by mouth every 4 (four) hours as needed for mild pain (pain score 1-3) or moderate pain (pain score 4-6).   Yes [provider]  acyclovir  (ZOVIRAX ) 400 MG tablet Take 400 mg by mouth. 02/08/24 04/08/24 Yes [provider]   CALMOSEPTINE 0.44-20.6 % OINT Apply 1 Application topically daily as needed (Skin irritation). 10/26/23  Yes [provider]  clopidogrel  (PLAVIX ) 75  MG tablet Take 75 mg by mouth daily. 01/20/24  Yes [provider]  donepezil  (ARICEPT ) 5 MG tablet Take 5 mg by mouth daily. 12/17/22  Yes [provider]  doxycycline  (VIBRAMYCIN ) 100 MG capsule Take 100 mg by mouth 2 (two) times daily. 02/06/24  Yes [provider]  ELIQUIS  5 MG TABS tablet TAKE 1 TABLET(5 MG) BY MOUTH TWICE DAILY 01/02/24  Yes Turner, Rufus Council, MD  empagliflozin  (JARDIANCE ) 10 MG TABS tablet Take 10 mg by mouth daily.   Yes [provider]  ezetimibe  (ZETIA ) 10 MG tablet Take 10 mg by mouth every evening. 12/17/22  Yes [provider]  furosemide  (LASIX ) 20 MG tablet Take 20 mg by mouth as needed for fluid or edema. 01/20/24  Yes [provider]  gabapentin  (NEURONTIN ) 300 MG capsule Take 1 capsule (300 mg total) by mouth at bedtime. 11/17/23  Yes Audria Leather, MD  insulin  lispro (HUMALOG ) 100 UNIT/ML injection Inject 1-7 Units into the skin 3 (three) times daily before meals. Sliding scale 141-180=1 unit,181-220= 2 units,221-260= 3 units,261-300=4 units, 301-340=5 units, 341-380=6 units, 381-400 = 7 units,> 400 call MD   Yes [provider]  Insulin  NPH, Human,, Isophane, (NOVOLIN  N FLEXPEN) 100 UNIT/ML Kiwkpen Inject 15 units at morning and 18 units at night Patient taking differently: Inject 14-18 Units into the skin See admin instructions. Inject 14 units at morning and 18 units at night 09/24/21  Yes Lajean Pike, MD  levothyroxine  (SYNTHROID ) 150 MCG tablet Take 150 mcg by mouth daily before breakfast. 04/13/23  Yes [provider]  metoprolol  succinate (TOPROL -XL) 25 MG 24 hr tablet Take 1 tablet (25 mg total) by mouth 2 (two) times daily. 01/27/24  Yes Turner, Rufus Council, MD  olmesartan (BENICAR) 20 MG tablet Take 20 mg by mouth daily. 01/11/24  Yes [provider]  omeprazole (PRILOSEC) 20 MG capsule Take 20 mg by mouth daily as needed (acid reflux). 04/13/23  Yes [provider]  oxyCODONE -acetaminophen  (PERCOCET/ROXICET) 5-325 MG tablet Take 1 tablet by mouth every 4 (four) hours as needed for severe pain (pain score 7-10). 12/07/23  Yes Carlene Che, MD  OZEMPIC , 0.25 OR 0.5 MG/DOSE, 2 MG/3ML SOPN Inject 0.25 mg into the skin once a week. Sundays 03/14/23  Yes [provider]  polyethylene glycol (MIRALAX  / GLYCOLAX ) 17 g packet Take 17 g by mouth daily as needed. 11/17/23  Yes Audria Leather, MD  prednisoLONE  acetate (PRED FORTE ) 1 % ophthalmic suspension Apply 1 drop to eye. 02/08/24 02/16/2024 Yes [provider]  rosuvastatin  (CRESTOR ) 20 MG tablet Take 1 tablet (20 mg total) by mouth daily. 11/18/23  Yes Audria Leather, MD  SANTYL 250 UNIT/GM ointment Apply 1 Application topically as needed.   Yes [provider]  sertraline  (ZOLOFT ) 50 MG tablet Take 50 mg by mouth daily. 12/17/22  Yes [provider]  amoxicillin-clavulanate (AUGMENTIN) 250-62.5 MG/5ML suspension Take 10 mLs by mouth 2 (two) times daily. Patient not taking: Reported on 02/07/2024 01/21/24   [provider]  HYDROcodone -acetaminophen  (NORCO/VICODIN) 5-325 MG tablet Take 1 tablet by mouth every 6 (six) hours as needed for moderate pain (pain score 4-6). Patient not taking: Reported on 02/07/2024 11/08/23   Carlene Che, MD     Critical care time:  45 minutes       CRITICAL CARE Performed by: Patt Boozer Jenayah Antu   Total critical care time: 45 minutes  Critical care time was exclusive of separately billable procedures and treating  other patients.  Critical care was necessary to treat or prevent imminent or life-threatening deterioration.  Critical care was time spent personally by me on the following activities: development of treatment plan with patient and/or surrogate as well as nursing, discussions with  consultants, evaluation of patient's response to treatment, examination of patient, obtaining history from patient or surrogate, ordering and performing treatments and interventions, ordering and review of laboratory studies, ordering and review of radiographic studies, pulse oximetry and re-evaluation of patient's condition.   Patt Boozer Briony Parveen, PA-C [facility] See Amion for pager If no response to pager , please call [phone] until 7pm After 7:00 pm call Elink  [phone]

## 2024-02-17 NOTE — Anesthesia Postprocedure Evaluation (Signed)
 Anesthesia Post Note  Patient: Tina Hester  Procedure(s) Performed: RIGHT FEMORAL ARTERY EXPLORATION (Right: Groin) APPLICATION, PREVENA WOUND VAC (Right: Groin) RIGHT FEMORAL ARTERY REPAIR (Right: Groin)     Patient location during evaluation: SICU Anesthesia Type: General Level of consciousness: sedated Pain management: pain level controlled Vital Signs Assessment: post-procedure vital signs reviewed and stable Respiratory status: patient remains intubated per anesthesia plan Cardiovascular status: stable Postop Assessment: no apparent nausea or vomiting Anesthetic complications: no  No notable events documented.  Last Vitals:  Vitals:   02/17/24 2109 02/17/24 2121  BP:    Pulse:    Resp: 18 18  Temp:  (!) 35.1 C  SpO2:      Last Pain:  Vitals:   02/17/24 1729  TempSrc:   PainSc: 10-Worst pain ever                 Melvenia Stabs

## 2024-02-17 NOTE — Plan of Care (Signed)

## 2024-02-17 NOTE — Op Note (Signed)
    Patient name: Tina Hester MRN: [id number] DOB: Jan 07, 1941 Sex: female  02/17/2024 Pre-operative Diagnosis: Hematoma right groin with hypotension status post endovascular revascularization right lower extremity  Post-operative diagnosis:  Same Surgeon:  Sarajane Cumming. Vikki Graves, MD  Assistant: Naida Austria, PA Procedure Performed:   Exploration right groin hematoma with primary repair of common femoral arteriotomy, application of Prevena VAC, placement 19 round drain  Indications: 83 year old female underwent right lower extremity endovascular revascularization for osteomyelitis of her right great toe.  This was performed via a 5 [place] sheath with an antegrade access.  Postoperatively she was noted to have hematoma and developed hypotension.  FemoStop was placed but when this was removed patient had continued hypotension and pain and expansion of hematoma of the right groin she was subsequently indicated for exploration.  Experience assistant was necessary to facilitate exposure of the groin wound primarily.  The artery as well as assist with closure and placement of Prevena VAC and drain.  Findings: There was 1 area on the anterior wall of the artery which was just on the distal common femoral artery which appeared to have been thick arteriotomy site and this was primarily repaired with 5-0 Prolene suture.  There was significant hematoma in the soft tissues of the groin.  Her skin was very thin and for this reason the incision was closed with interrupted sutures and staples.  There was a large cavity tracking inferiorly medially and a 19 round drain was placed.   Procedure:  The patient was identified in the holding area and taken to the operating room where she was put supine operative table arterial lines were placed she was intubated and general anesthesia was induced.  Antibiotics were administered timeout was called.  She was sterilely prepped and draped in the right groin.  We performed a  vertical incision where it appeared the cannulation site was.  We dissected down through significant fatty tissue identified the common femoral artery which was quite diminutive and encased in scar tissue.  There was 1 area we identified which appeared to be the arteriotomy which was not actively bleeding but was primarily repaired with 5-0 Prolene suture.  Significant hematoma was then evacuated the wound was thoroughly irrigated.  A 19 round drain was tunneled from lateral to medial where there is a large cavity and affixed to the skin with 3-0 nylon suture.  All of the subcutaneous tissue was unhealthy appearing and very thin we did reapproximate some tissue using 2-0 Vicryl and more superficial tissue was reapproximated with interrupted 3-0 Vicryl.  Staples were placed at the skin level.  A Prevena VAC was fashioned at the skin level.  All counts were correct at completion.  Patient did have hemodynamic instability with long runs of ventricular tachycardia during this case.  She will remain intubated and admitted to the ICU.  EBL: 100 cc   Linzie Boursiquot C. Vikki Graves, MD [facility] Office: [phone] Pager: [phone]

## 2024-02-17 NOTE — Progress Notes (Signed)
 [facility] PROGRESS NOTE  ASSESSMENT / PLAN: Tina Hester is a 83 y.o. female with right great toe osteomyelitis and non-invasive evidence of peripheral arterial disease. She has known left common femoral occlusion causing CLTI in LLE. Plan angiogram today.   SUBJECTIVE: Confused. Ready for cath lab.  OBJECTIVE: BP (!) 120/47   Pulse 66   Temp 98.4 F (36.9 C)   Resp 18   Wt 78.8 kg   SpO2 99%   BMI 33.93 kg/m   Intake/Output Summary (Last 24 hours) at 02/17/2024 0951 Last data filed at 02/16/2024 1726 Gross per 24 hour  Intake 465.27 ml  Output --  Net 465.27 ml    Elderly woman.  No distress.  Pleasantly confused. Regular rate and rhythm Unlabored breathing     Latest Ref Rng & Units 02/17/2024    6:04 AM 02/16/2024    5:38 AM 02/15/2024    5:52 AM  CBC  WBC 4.0 - 10.5 K/uL 7.6  7.7  8.8   Hemoglobin 12.0 - 15.0 g/dL 16.1  09.6  04.5   Hematocrit 36.0 - 46.0 % 32.6  33.3  33.6   Platelets 150 - 400 K/uL 175  168  175         Latest Ref Rng & Units 02/17/2024    6:04 AM 02/16/2024    5:38 AM 02/15/2024    5:52 AM  CMP  Glucose 70 - 99 mg/dL 409  811  914   BUN 8 - 23 mg/dL 21  21  19    Creatinine 0.44 - 1.00 mg/dL 7.82  9.56  2.13   Sodium 135 - 145 mmol/L 140  137  137   Potassium 3.5 - 5.1 mmol/L 3.7  3.9  4.3   Chloride 98 - 111 mmol/L 110  109  109   CO2 22 - 32 mmol/L 20  19  18    Calcium  8.9 - 10.3 mg/dL 8.2  8.1  8.1   Total Protein 6.5 - 8.1 g/dL   5.9   Total Bilirubin 0.0 - 1.2 mg/dL   0.8   Alkaline Phos 38 - 126 U/L   71   AST 15 - 41 U/L   43   ALT 0 - 44 U/L   29     Estimated Creatinine Clearance: 25.7 mL/min (A) (by C-G formula based on SCr of 1.54 mg/dL (H)).  Heber Little. Edgardo Goodwill, MD [facility] Phone Number: [phone] 02/17/2024 9:51 AM

## 2024-02-17 NOTE — Anesthesia Preprocedure Evaluation (Signed)
 Anesthesia Evaluation  Patient identified by MRN, date of birth, ID band Patient awake    Reviewed: Allergy & Precautions, NPO status , Patient's Chart, lab work & pertinent test results  Airway Mallampati: III  TM Distance: >3 FB Neck ROM: Full    Dental  (+) Dental Advisory Given   Pulmonary sleep apnea    breath sounds clear to auscultation       Cardiovascular hypertension, Pt. on medications + CAD, + Past MI, + Peripheral Vascular Disease and +CHF  + Valvular Problems/Murmurs AS  Rhythm:Regular Rate:Normal     Neuro/Psych TIA Neuromuscular disease    GI/Hepatic Neg liver ROS,GERD  ,,  Endo/Other  diabetes, Type 2Hypothyroidism    Renal/GU CRFRenal disease     Musculoskeletal   Abdominal   Peds  Hematology  (+) Blood dyscrasia, anemia   Anesthesia Other Findings   Reproductive/Obstetrics                             Anesthesia Physical Anesthesia Plan  ASA: 4 and emergent  Anesthesia Plan: General   Post-op Pain Management:    Induction: Intravenous and Rapid sequence  PONV Risk Score and Plan: 3 and Ondansetron , Dexamethasone  and Treatment may vary due to age or medical condition  Airway Management Planned: Oral ETT  Additional Equipment: Arterial line  Intra-op Plan:   Post-operative Plan: Possible Post-op intubation/ventilation  Informed Consent: I have reviewed the patients History and Physical, chart, labs and discussed the procedure including the risks, benefits and alternatives for the proposed anesthesia with the patient or authorized representative who has indicated his/her understanding and acceptance.   Patient has DNR.  Discussed DNR with power of attorney and Suspend DNR.   Dental advisory given and Consent reviewed with POA  Plan Discussed with: CRNA  Anesthesia Plan Comments:        Anesthesia Quick Evaluation

## 2024-02-17 NOTE — Consult Note (Addendum)
 Cardiology Consultation   Patient ID: Tina Hester MRN: [id number]; DOB: 02-Dec-1940  Admit date: 02/14/2024 Date of Consult: 02/17/2024  PCP:  Olin Bertin, MD   [facility] Providers Cardiologist:  Gaylyn Keas, MD  Electrophysiologist:  Lei Pump, MD       Patient Profile: Tina Hester is a 83 y.o. female with a hx of CAD, T2DM, hypothyroidism, hypertension, PAD, bilateral carotid artery stenosis, aortic stenosis, OSA, A-fib who is being seen 02/17/2024 for preoperative evaluation at the request of Dr. Ariel Begun.  History of Present Illness: Tina Hester presented with right great toe redness and pain, was transferred to [facility] for further evaluation.  MRI shows osteomyelitis.  Dr. Edgardo Goodwill took patient for angiogram today, underwent right popliteal artery, right tibioperoneal trunk and posterior tibial angioplasty.  Cardiology asked to comment on preoperative risk for LCFA endarterectomy.  She was admitted with NSTEMI 10/2023, cath showed 70% ostial to mid left main stenosis, 70% ostial to proximal RCA, 40% proximal to mid RCA, 90% proximal LCx, 40% proximal to mid LCx, 60% distal LCx, 70% proximal LAD.  She was not felt to be a candidate for CABG or PCI, medical management was recommended.  Echocardiogram 10/2023 showed EF 60-65, normal RV function, low-flow low gradient severe aortic stenosis.   Past Medical History:  Diagnosis Date   Anemia    s/p Heme work -up normal EGD and colonscopy in 2012 per pt,neg SPEP   Anxiety    Aortic stenosis    Mild by echo 12/2021 with mean aortic valve gradient 17 mmHg   Bilateral carotid artery stenosis 06/08/2015   1-39% bilateral   Bradycardia    CKD (chronic kidney disease), stage III (HCC)    Diabetes mellitus without complication (HCC)    type 2   GERD (gastroesophageal reflux disease)    Gout    HSV-1 (herpes simplex virus 1) infection    Acyclovir  prn   Hyperkalemia    Hyperlipidemia    Hypertension    Joint  pain    osteoarthritis by Xray- possible erosion of R 4th MCP,elevated uric  acid ,ANA +    MCI (mild cognitive impairment)    Morbid obesity (HCC)    OSA on CPAP    Osteopenia    PAF (paroxysmal atrial fibrillation) (HCC)    Pain management    Neurosurg: Dr Gerri Kras   Pulmonary HTN (HCC)    Moderate by echo  2016. PASP   TIA (transient ischemic attack)    remote history of TIA's early 2000    Past Surgical History:  Procedure Laterality Date   ABDOMINAL AORTOGRAM W/LOWER EXTREMITY N/A 11/11/2023   Procedure: ABDOMINAL AORTOGRAM W/LOWER EXTREMITY;  Surgeon: Carlene Che, MD;  Location: [facility] INVASIVE CV LAB;  Service: Cardiovascular;  Laterality: N/A;   ABDOMINAL HYSTERECTOMY     APPENDECTOMY     BACK SURGERY     cervical fusion   BLADDER SURGERY     CESAREAN SECTION     CHOLECYSTECTOMY     LEFT HEART CATH AND CORONARY ANGIOGRAPHY N/A 11/10/2023   Procedure: LEFT HEART CATH AND CORONARY ANGIOGRAPHY;  Surgeon: Odie Benne, MD;  Location: [facility] INVASIVE CV LAB;  Service: Cardiovascular;  Laterality: N/A;   LOWER EXTREMITY ANGIOGRAPHY N/A 02/25/2023   Procedure: Lower Extremity Angiography;  Surgeon: Carlene Che, MD;  Location: [facility] INVASIVE CV LAB;  Service: Cardiovascular;  Laterality: N/A;   LOWER EXTREMITY ANGIOGRAPHY N/A 11/11/2023   Procedure: Lower Extremity Angiography;  Surgeon: Carlene Che, MD;  Location: [facility] INVASIVE CV LAB;  Service: Cardiovascular;  Laterality: N/A;   LOWER EXTREMITY INTERVENTION N/A 11/11/2023   Procedure: LOWER EXTREMITY INTERVENTION;  Surgeon: Carlene Che, MD;  Location: [facility] INVASIVE CV LAB;  Service: Cardiovascular;  Laterality: N/A;   PERIPHERAL VASCULAR INTERVENTION  02/25/2023   Procedure: PERIPHERAL VASCULAR INTERVENTION;  Surgeon: Carlene Che, MD;  Location: [facility] INVASIVE CV LAB;  Service: Cardiovascular;;       Scheduled Meds:  acetaminophen        [START ON 02/02/2024] aspirin  EC  325 mg Oral Daily    collagenase   Topical Daily   donepezil   5 mg Oral Daily   enoxaparin (LOVENOX) injection  40 mg Subcutaneous QHS   ezetimibe   10 mg Oral QPM   gabapentin   300 mg Oral QHS   insulin  aspart  0-15 Units Subcutaneous TID WC   insulin  aspart  0-5 Units Subcutaneous QHS   irbesartan   150 mg Oral Daily   levothyroxine   150 mcg Oral QAC breakfast   linezolid  600 mg Oral Q12H   metoprolol  succinate  25 mg Oral BID   ondansetron        rosuvastatin   20 mg Oral QHS   sertraline   50 mg Oral Daily   Continuous Infusions:  sodium chloride  1 mL/kg/hr (02/17/24 1100)   ampicillin-sulbactam (UNASYN) IV 3 g (02/17/24 0514)   PRN Meds: acetaminophen , acetaminophen  **OR** acetaminophen , glucagon (human recombinant), guaiFENesin, hydrALAZINE , ipratropium-albuterol, metoprolol  tartrate, morphine injection, ondansetron , ondansetron  **OR** ondansetron  (ZOFRAN ) IV, oxyCODONE , senna-docusate, traZODone  Allergies:    Allergies  Allergen Reactions   Pneumococcal Vaccine Anaphylaxis    weakness   Sulfa Antibiotics Hives   Pregabalin Other (See Comments)    Other reaction(s): depression, patient unsure    Social History:   Social History   Socioeconomic History   Marital status: Divorced    Spouse name: Not on file   Number of children: 4   Years of education: Not on file   Highest education level: Some college, no degree  Occupational History   Not on file  Tobacco Use   Smoking status: Never   Smokeless tobacco: Never  Vaping Use   Vaping status: Never Used  Substance and Sexual Activity   Alcohol use: No   Drug use: No   Sexual activity: Not on file  Other Topics Concern   Not on file  Social History Narrative   10/27/21 lives with daughter, Tina Hester   Some soda   Social Drivers of Health   Financial Resource Strain: Not on file  Food Insecurity: No Food Insecurity (02/14/2024)   Hunger Vital Sign    Worried About Running Out of Food in the Last Year: Never true    Ran Out of Food  in the Last Year: Never true  Transportation Needs: No Transportation Needs (02/14/2024)   PRAPARE - Administrator, Civil Service (Medical): No    Lack of Transportation (Non-Medical): No  Physical Activity: Not on file  Stress: Not on file  Social Connections: Unknown (02/14/2024)   Social Connection and Isolation Panel    Frequency of Communication with Friends and Family: Patient unable to answer    Frequency of Social Gatherings with Friends and Family: Patient unable to answer    Attends Religious Services: Patient unable to answer    Active Member of Clubs or Organizations: Patient unable to answer    Attends Banker Meetings: Patient unable to answer  Marital Status: Divorced  Catering manager Violence: Not At Risk (02/14/2024)   Humiliation, Afraid, Rape, and Kick questionnaire    Fear of Current or Ex-Partner: No    Emotionally Abused: No    Physically Abused: No    Sexually Abused: No    Family History:    Family History  Problem Relation Age of Onset   Arrhythmia Brother      ROS:  Please see the history of present illness.   All other ROS reviewed and negative.     Physical Exam/Data: Vitals:   02/17/24 1753 02/17/24 1759 02/17/24 1805 02/17/24 1835  BP: 94/63 96/65 (!) 87/44 104/71  Pulse: (!) 47 66 (!) 132 88  Resp: (!) 24 (!) 35 15 (!) 25  Temp:      TempSrc:      SpO2: 100% 100% 100% 100%  Weight:       No intake or output data in the 24 hours ending 02/17/24 1850    02/14/2024    2:00 PM 01/27/2024   11:44 AM 11/17/2023    2:43 AM  Last 3 Weights  Weight (lbs) 173 lb 11.6 oz 173 lb 12.8 oz 183 lb 13.8 oz  Weight (kg) 78.8 kg 78.835 kg 83.4 kg     Body mass index is 33.93 kg/m.  General:   in no acute distress HEENT: normal Neck: no JVD Cardiac: Tachycardic, regular, 2 out of 6 systolic murmur Lungs:  clear to auscultation bilaterally Abd: soft, nontender Ext: no edema Musculoskeletal:  No deformities Skin: warm and  dry  Neuro: Oriented to person and place Psych:  Normal affect   EKG:  The EKG was personally reviewed and demonstrates: A-fib with RVR, nonspecific interventricular conduction delay, rate 126 Telemetry:  Telemetry was personally reviewed and demonstrates: A-fib, rates 80s to 120s  Relevant CV Studies:   Laboratory Data: High Sensitivity Troponin:  No results for input(s): TROPONINIHS in the last 720 hours.   Chemistry Recent Labs  Lab 02/15/24 0552 02/16/24 0538 02/17/24 0604  NA 137 137 140  K 4.3 3.9 3.7  CL 109 109 110  CO2 18* 19* 20*  GLUCOSE 100* 124* 103*  BUN 19 21 21   CREATININE 1.32* 1.47* 1.54*  CALCIUM  8.1* 8.1* 8.2*  MG 1.9 1.8 1.8  GFRNONAA 40* 35* 33*  ANIONGAP 10 9 10     Recent Labs  Lab 02/15/24 0552  PROT 5.9*  ALBUMIN 2.4*  AST 43*  ALT 29  ALKPHOS 71  BILITOT 0.8   Lipids No results for input(s): CHOL, TRIG, HDL, LABVLDL, LDLCALC, CHOLHDL in the last 168 hours.  Hematology Recent Labs  Lab 02/16/24 0538 02/17/24 0604 02/17/24 1643  WBC 7.7 7.6 9.5  RBC 3.86* 3.73* 3.62*  HGB 10.4* 10.0* 9.9*  HCT 33.3* 32.6* 32.1*  MCV 86.3 87.4 88.7  MCH 26.9 26.8 27.3  MCHC 31.2 30.7 30.8  RDW 15.9* 15.8* 15.9*  PLT 168 175 225   Thyroid  No results for input(s): TSH, FREET4 in the last 168 hours.  BNPNo results for input(s): BNP, PROBNP in the last 168 hours.  DDimer No results for input(s): DDIMER in the last 168 hours.  Radiology/Studies:  VAS US  LOWER EXTREMITY ARTERIAL DUPLEX Result Date: 02/15/2024 LOWER EXTREMITY ARTERIAL DUPLEX STUDY Patient Name:  Tina Hester  Date of Exam:   02/15/2024 Medical Rec #: [id number]           Accession #:    [id number] Date of Birth: 20-Mar-1941  Patient Gender: F Patient Age:   50 years Exam Location:  [facility] Procedure:      VAS US  LOWER EXTREMITY ARTERIAL DUPLEX Referring Phys: [facility]  --------------------------------------------------------------------------------  Indications: Ischemia & great toe ulceration/OM. High Risk Factors: Hypertension, hyperlipidemia, Diabetes, no history of                    smoking, prior MI, coronary artery disease. Other Factors: TIA, Afib, CKD, CHF.  Vascular Interventions: 02/25/2023 RLE femoropopliteal angioplasty & stenting. Current ABI:            RT - Madill LT - unable to obtain Limitations: continuous patient movement, calcific shadowing, tissue properties Comparison Study: Previous exam on 03/28/2023 RLE stent patent & ATA occlusion Performing Technologist: Jody Hill RVT, RDMS  Examination Guidelines: A complete evaluation includes B-mode imaging, spectral Doppler, color Doppler, and power Doppler as needed of all accessible portions of each vessel. Bilateral testing is considered an integral part of a complete examination. Limited examinations for reoccurring indications may be performed as noted.  +-----------+--------+-----+-------------+----------+--------------------------+ RIGHT      PSV cm/sRatioStenosis     Waveform  Comments                   +-----------+--------+-----+-------------+----------+--------------------------+ CFA Mid    78                        biphasic                             +-----------+--------+-----+-------------+----------+--------------------------+ DFA        65                        biphasic                             +-----------+--------+-----+-------------+----------+--------------------------+ SFA Prox   90                        biphasic                             +-----------+--------+-----+-------------+----------+--------------------------+ SFA Mid    79                        biphasic                             +-----------+--------+-----+-------------+----------+--------------------------+ POP Mid    206          50-74%       monophasiccalcific/heterogenous                               stenosis               plaque                     +-----------+--------+-----+-------------+----------+--------------------------+ POP Distal 100                       monophasiccalcific/heterogenous  plaque                     +-----------+--------+-----+-------------+----------+--------------------------+ TP Trunk   63                        monophasic                           +-----------+--------+-----+-------------+----------+--------------------------+ ATA Prox                occluded                                          +-----------+--------+-----+-------------+----------+--------------------------+ ATA Mid                 occluded                                          +-----------+--------+-----+-------------+----------+--------------------------+ ATA Distal              occluded                                          +-----------+--------+-----+-------------+----------+--------------------------+ PTA Prox                                       unable to visualize due to                                                patient                    +-----------+--------+-----+-------------+----------+--------------------------+ PTA Mid    49                        monophasic                           +-----------+--------+-----+-------------+----------+--------------------------+ PTA Distal 29                        monophasic                           +-----------+--------+-----+-------------+----------+--------------------------+ PERO Prox                                      unable to visualize        +-----------+--------+-----+-------------+----------+--------------------------+ PERO Mid   78                        monophasic                           +-----------+--------+-----+-------------+----------+--------------------------+ PERO Distal71  monophasic                           +-----------+--------+-----+-------------+----------+--------------------------+  Right Stent(s): +---------------+--++----------++ Prox to Stent  +---------------+--++----------++ Proximal Stent +---------------+--++----------++ Mid Stent      +---------------+--++----------++ Distal Stent   +---------------+--++----------++ Distal to Stent93monophasic +---------------+--++----------++    Summary: Right: 50-74% stenosis noted in the popliteal artery. Total occlusion noted in the anterior tibial artery. Femoropopliteal stent appears patent without evidence of significant stenosis. Arterial wall calcification throughout lower extremity. Heavy calcific shadowing in mid popliteal making imaging somewhat difficult.  See table(s) above for measurements and observations. Electronically signed by Irvin Mantel on 02/15/2024 at 7:06:14 PM.    Final    VAS US  ABI WITH/WO TBI Result Date: 02/15/2024  LOWER EXTREMITY DOPPLER STUDY Patient Name:  Tina Hester  Date of Exam:   02/15/2024 Medical Rec #: [id number]           Accession #:    [id number] Date of Birth: Jul 13, 1941           Patient Gender: F Patient Age:   86 years Exam Location:  [facility] Procedure:      VAS US  ABI WITH/WO TBI Referring Phys: Porfirio Bristol PATERSON --------------------------------------------------------------------------------  Indications: Ischemia & great toe ulceration/OM. High Risk         Hypertension, hyperlipidemia, prior MI, coronary artery Factors:          disease. Other Factors: TIA, Afib, CKD, CHF.  Vascular Interventions: 02/25/2023 RLE femoropopliteal angioplasty & stenting. Limitations: Today's exam was limited due to continuous patient movement, pain              intolerance. Comparison Study: Previous exam on 11/08/2023 Performing Technologist: Arlyce Berger RVT, RDMS  Examination  Guidelines: A complete evaluation includes at minimum, Doppler waveform signals and systolic blood pressure reading at the level of bilateral brachial, anterior tibial, and posterior tibial arteries, when vessel segments are accessible. Bilateral testing is considered an integral part of a complete examination. Photoelectric Plethysmograph (PPG) waveforms and toe systolic pressure readings are included as required and additional duplex testing as needed. Limited examinations for reoccurring indications may be performed as noted.  ABI Findings: +---------+------------------+-----+-----------+--------+ Right    Rt Pressure (mmHg)IndexWaveform   Comment  +---------+------------------+-----+-----------+--------+ Brachial 140                    multiphasic         +---------+------------------+-----+-----------+--------+ PTA      254               1.81 monophasic          +---------+------------------+-----+-----------+--------+ DP       84                0.60 monophasic          +---------+------------------+-----+-----------+--------+ Great Toe                       Abnormal            +---------+------------------+-----+-----------+--------+ +---------+------------------+-----+-----------+-------+ Left     Lt Pressure (mmHg)IndexWaveform   Comment +---------+------------------+-----+-----------+-------+ Brachial 136                    multiphasic        +---------+------------------+-----+-----------+-------+ PTA                                              +---------+------------------+-----+-----------+-------+  DP                                               +---------+------------------+-----+-----------+-------+ Great Toe0                 0.00 Absent             +---------+------------------+-----+-----------+-------+    patient pain intolerance, unable to attempt doppler/pressures.  Summary: Right: Resting right ankle-brachial index indicates  noncompressible right lower extremity arteries. Unable to obtain TBI due to continuous patient movement, however doppler waveform severely dampened. *See table(s) above for measurements and observations.  Electronically signed by Irvin Mantel on 02/15/2024 at 7:05:15 PM.    Final    MR TOES RIGHT W WO CONTRAST Result Date: 02/14/2024 CLINICAL DATA:  Right great toe wound with pain and swelling. History of diabetes. EXAM: MRI OF THE RIGHT TOES WITHOUT AND WITH CONTRAST TECHNIQUE: Multiplanar, multisequence MR imaging of the right forefoot was performed both before and after administration of intravenous contrast. CONTRAST:  7mL GADAVIST  GADOBUTROL  1 MMOL/ML IV SOLN COMPARISON:  MRI of the right foot dated 12/31/2022. FINDINGS: Bones/Joint/Cartilage T2/STIR hyperintense marrow edema of the distal phalanx of the great toe and the distal half of the proximal phalanx of the great toe with associated enhancement on postcontrast sequences and subtle corresponding T1 hypointensity. Trace effusion of the interphalangeal joint of the great toe. These findings are most compatible with osteomyelitis/septic arthritis. No convincing marrow signal abnormality identified elsewhere to suggest osteomyelitis. No acute fracture or dislocation. Moderate first MTP joint space narrowing and osteophytosis. Mild-to-moderate joint space narrowing of the second through fifth MTP joints. Diffuse interphalangeal joint space narrowing. Moderate degenerative changes of the midfoot. Ligaments Collateral ligaments are intact.  Lisfranc ligament is intact. Muscles and Tendons Flexor and extensor compartment tendons are intact. Diffuse fatty atrophy with increased T2 signal of the intrinsic musculature of the foot is nonspecific and may reflect chronic denervation changes. Soft tissue Soft tissue wound/ulceration at the dorsal aspect of the mid great toe. No abscess. Diffuse nonspecific subcutaneous edema along the dorsal foot. IMPRESSION: 1.  Marrow signal abnormality of the distal phalanx and distal half of the proximal phalanx of the great toe with associated enhancement and trace effusion of the interphalangeal joint of the great toe, most compatible with osteomyelitis/septic arthritis. 2. Soft tissue wound/ulceration at the dorsal aspect of the mid great toe. No abscess. Diffuse nonspecific subcutaneous edema along the dorsal foot. Cellulitis can not be excluded. 3. Mild-to-moderate degenerative changes of the forefoot and midfoot, as above. Electronically Signed   By: Mannie Seek M.D.   On: 02/14/2024 18:17     Assessment and Plan:  Preop evaluation: Prior to left common femoral endarterectomy.  Recent cardiac catheterization showed severe multivessel disease, including severe left main disease, for which she was not felt to be a candidate for CABG or PCI.  In addition echocardiogram showed severe low-flow low gradient aortic stenosis for which she is not a candidate for TAVR.  In setting of her severe left main disease and severe aortic stenosis, would consider her prohibitive risk for surgery from cardiac standpoint.    CAD: Severe multivessel disease on cath 10/2023.  Not a candidate for CABG or PCI.  Continue aspirin , statin  Aortic stenosis: Severe low-flow low gradient aortic stenosis on echo 10/2023.  Not a candidate for TAVR.  Atrial fibrillation: Appears was in rate controlled A-fib on presentation but now in A-fib with RVR.  Suspect RVR due to pain/hematoma at procedure site.  Anticoagulation currently on hold  Goals of care: Recommend palliative care evaluation    For questions or updates, please contact [facility] Please consult [email or web address] for contact info under    Signed, Wendie Hamburg, MD  02/17/2024 6:50 PM

## 2024-02-18 ENCOUNTER — Inpatient Hospital Stay (HOSPITAL_COMMUNITY)

## 2024-02-18 DIAGNOSIS — I4891 Unspecified atrial fibrillation: Secondary | ICD-10-CM

## 2024-02-18 DIAGNOSIS — R0902 Hypoxemia: Secondary | ICD-10-CM | POA: Diagnosis not present

## 2024-02-18 DIAGNOSIS — R0989 Other specified symptoms and signs involving the circulatory and respiratory systems: Secondary | ICD-10-CM | POA: Diagnosis not present

## 2024-02-18 DIAGNOSIS — J9601 Acute respiratory failure with hypoxia: Secondary | ICD-10-CM

## 2024-02-18 DIAGNOSIS — Z9889 Other specified postprocedural states: Secondary | ICD-10-CM

## 2024-02-18 DIAGNOSIS — R579 Shock, unspecified: Secondary | ICD-10-CM

## 2024-02-18 DIAGNOSIS — R570 Cardiogenic shock: Secondary | ICD-10-CM

## 2024-02-18 DIAGNOSIS — E8721 Acute metabolic acidosis: Secondary | ICD-10-CM

## 2024-02-18 DIAGNOSIS — I517 Cardiomegaly: Secondary | ICD-10-CM | POA: Diagnosis not present

## 2024-02-18 DIAGNOSIS — Z452 Encounter for adjustment and management of vascular access device: Secondary | ICD-10-CM | POA: Diagnosis not present

## 2024-02-18 DIAGNOSIS — G4733 Obstructive sleep apnea (adult) (pediatric): Secondary | ICD-10-CM

## 2024-02-18 LAB — POCT I-STAT 7, (LYTES, BLD GAS, ICA,H+H)
Acid-base deficit: 16 mmol/L — ABNORMAL HIGH (ref 0.0–2.0)
Acid-base deficit: 19 mmol/L — ABNORMAL HIGH (ref 0.0–2.0)
Acid-base deficit: 20 mmol/L — ABNORMAL HIGH (ref 0.0–2.0)
Acid-base deficit: 22 mmol/L — ABNORMAL HIGH (ref 0.0–2.0)
Acid-base deficit: 22 mmol/L — ABNORMAL HIGH (ref 0.0–2.0)
Bicarbonate: 10 mmol/L — ABNORMAL LOW (ref 20.0–28.0)
Bicarbonate: 11.5 mmol/L — ABNORMAL LOW (ref 20.0–28.0)
Bicarbonate: 7.6 mmol/L — ABNORMAL LOW (ref 20.0–28.0)
Bicarbonate: 8.8 mmol/L — ABNORMAL LOW (ref 20.0–28.0)
Bicarbonate: 9.1 mmol/L — ABNORMAL LOW (ref 20.0–28.0)
Calcium, Ion: 1.1 mmol/L — ABNORMAL LOW (ref 1.15–1.40)
Calcium, Ion: 1.12 mmol/L — ABNORMAL LOW (ref 1.15–1.40)
Calcium, Ion: 1.29 mmol/L (ref 1.15–1.40)
Calcium, Ion: 1.33 mmol/L (ref 1.15–1.40)
Calcium, Ion: 1.49 mmol/L — ABNORMAL HIGH (ref 1.15–1.40)
HCT: 17 % — ABNORMAL LOW (ref 36.0–46.0)
HCT: 18 % — ABNORMAL LOW (ref 36.0–46.0)
HCT: 22 % — ABNORMAL LOW (ref 36.0–46.0)
HCT: 34 % — ABNORMAL LOW (ref 36.0–46.0)
HCT: 34 % — ABNORMAL LOW (ref 36.0–46.0)
Hemoglobin: 11.6 g/dL — ABNORMAL LOW (ref 12.0–15.0)
Hemoglobin: 11.6 g/dL — ABNORMAL LOW (ref 12.0–15.0)
Hemoglobin: 5.8 g/dL — CL (ref 12.0–15.0)
Hemoglobin: 6.1 g/dL — CL (ref 12.0–15.0)
Hemoglobin: 7.5 g/dL — ABNORMAL LOW (ref 12.0–15.0)
O2 Saturation: 100 %
O2 Saturation: 100 %
O2 Saturation: 97 %
O2 Saturation: 98 %
O2 Saturation: 99 %
Patient temperature: 35.1
Patient temperature: 36
Patient temperature: 36
Patient temperature: 36.9
Patient temperature: 37.6
Potassium: 4.2 mmol/L (ref 3.5–5.1)
Potassium: 4.5 mmol/L (ref 3.5–5.1)
Potassium: 4.5 mmol/L (ref 3.5–5.1)
Potassium: 4.8 mmol/L (ref 3.5–5.1)
Potassium: 4.9 mmol/L (ref 3.5–5.1)
Sodium: 142 mmol/L (ref 135–145)
Sodium: 143 mmol/L (ref 135–145)
Sodium: 143 mmol/L (ref 135–145)
Sodium: 143 mmol/L (ref 135–145)
Sodium: 147 mmol/L — ABNORMAL HIGH (ref 135–145)
TCO2: 10 mmol/L — ABNORMAL LOW (ref 22–32)
TCO2: 10 mmol/L — ABNORMAL LOW (ref 22–32)
TCO2: 11 mmol/L — ABNORMAL LOW (ref 22–32)
TCO2: 12 mmol/L — ABNORMAL LOW (ref 22–32)
TCO2: 9 mmol/L — ABNORMAL LOW (ref 22–32)
pCO2 arterial: 29.9 mmHg — ABNORMAL LOW (ref 32–48)
pCO2 arterial: 30.3 mmHg — ABNORMAL LOW (ref 32–48)
pCO2 arterial: 31.6 mmHg — ABNORMAL LOW (ref 32–48)
pCO2 arterial: 33.1 mmHg (ref 32–48)
pCO2 arterial: 39.1 mmHg (ref 32–48)
pH, Arterial: 6.974 — CL (ref 7.35–7.45)
pH, Arterial: 6.994 — CL (ref 7.35–7.45)
pH, Arterial: 7.072 — CL (ref 7.35–7.45)
pH, Arterial: 7.076 — CL (ref 7.35–7.45)
pH, Arterial: 7.181 — CL (ref 7.35–7.45)
pO2, Arterial: 143 mmHg — ABNORMAL HIGH (ref 83–108)
pO2, Arterial: 155 mmHg — ABNORMAL HIGH (ref 83–108)
pO2, Arterial: 172 mmHg — ABNORMAL HIGH (ref 83–108)
pO2, Arterial: 410 mmHg — ABNORMAL HIGH (ref 83–108)
pO2, Arterial: 456 mmHg — ABNORMAL HIGH (ref 83–108)

## 2024-02-18 LAB — BRAIN NATRIURETIC PEPTIDE: B Natriuretic Peptide: 372.1 pg/mL — ABNORMAL HIGH (ref 0.0–100.0)

## 2024-02-18 LAB — BASIC METABOLIC PANEL WITH GFR
Anion gap: 16 — ABNORMAL HIGH (ref 5–15)
Anion gap: 26 — ABNORMAL HIGH (ref 5–15)
BUN: 22 mg/dL (ref 8–23)
BUN: 23 mg/dL (ref 8–23)
CO2: 10 mmol/L — ABNORMAL LOW (ref 22–32)
CO2: 9 mmol/L — ABNORMAL LOW (ref 22–32)
Calcium: 8.6 mg/dL — ABNORMAL LOW (ref 8.9–10.3)
Calcium: 9.1 mg/dL (ref 8.9–10.3)
Chloride: 115 mmol/L — ABNORMAL HIGH (ref 98–111)
Chloride: 110 mmol/L (ref 98–111)
Creatinine, Ser: 1.76 mg/dL — ABNORMAL HIGH (ref 0.44–1.00)
Creatinine, Ser: 2.15 mg/dL — ABNORMAL HIGH (ref 0.44–1.00)
GFR, Estimated: 28 mL/min — ABNORMAL LOW (ref >60)
GFR, Estimated: 22 mL/min — ABNORMAL LOW (ref >60)
Glucose, Bld: 142 mg/dL — ABNORMAL HIGH (ref 70–99)
Glucose, Bld: 189 mg/dL — ABNORMAL HIGH (ref 70–99)
Potassium: 4.8 mmol/L (ref 3.5–5.1)
Potassium: 4.6 mmol/L (ref 3.5–5.1)
Sodium: 141 mmol/L (ref 135–145)
Sodium: 145 mmol/L (ref 135–145)

## 2024-02-18 LAB — CBC
HCT: 40.6 % (ref 36.0–46.0)
Hemoglobin: 12.5 g/dL (ref 12.0–15.0)
MCH: 28.4 pg (ref 26.0–34.0)
MCHC: 30.8 g/dL (ref 30.0–36.0)
MCV: 92.3 fL (ref 80.0–100.0)
Platelets: 152 10*3/uL (ref 150–400)
RBC: 4.4 MIL/uL (ref 3.87–5.11)
RDW: 15.2 % (ref 11.5–15.5)
WBC: 18.6 10*3/uL — ABNORMAL HIGH (ref 4.0–10.5)
nRBC: 0.1 % (ref 0.0–0.2)

## 2024-02-18 LAB — COOXEMETRY PANEL
Carboxyhemoglobin: 1.3 % (ref 0.5–1.5)
Methemoglobin: <0.7 % (ref 0.0–1.5)
O2 Saturation: 91.7 %
Total hemoglobin: <6.9 g/dL — CL (ref 12.0–16.0)

## 2024-02-18 LAB — CG4 I-STAT (LACTIC ACID): Lactic Acid, Venous: >15 mmol/L (ref 0.5–1.9)

## 2024-02-18 LAB — PREPARE RBC (CROSSMATCH)

## 2024-02-18 LAB — PROTIME-INR
INR: 3.6 — ABNORMAL HIGH (ref 0.8–1.2)
Prothrombin Time: 36.3 s — ABNORMAL HIGH (ref 11.4–15.2)

## 2024-02-18 LAB — APTT: aPTT: 55 s — ABNORMAL HIGH (ref 24–36)

## 2024-02-18 LAB — GLUCOSE, CAPILLARY: Glucose-Capillary: 146 mg/dL — ABNORMAL HIGH (ref 70–99)

## 2024-02-18 LAB — HEMOGLOBIN AND HEMATOCRIT, BLOOD
HCT: 23.5 % — ABNORMAL LOW (ref 36.0–46.0)
Hemoglobin: 7.1 g/dL — ABNORMAL LOW (ref 12.0–15.0)

## 2024-02-18 LAB — TROPONIN I (HIGH SENSITIVITY): Troponin I (High Sensitivity): 225 ng/L (ref <18)

## 2024-02-18 LAB — FIBRINOGEN: Fibrinogen: 180 mg/dL — ABNORMAL LOW (ref 210–475)

## 2024-02-18 LAB — LACTIC ACID, PLASMA: Lactic Acid, Venous: >9 mmol/L (ref 0.5–1.9)

## 2024-02-18 LAB — MAGNESIUM: Magnesium: 2 mg/dL (ref 1.7–2.4)

## 2024-02-18 LAB — PROCALCITONIN: Procalcitonin: <0.1 ng/mL

## 2024-02-18 MED ORDER — LEVOTHYROXINE SODIUM 75 MCG PO TABS: 150.0000 ug | ORAL_TABLET | Freq: Every day | ORAL | Status: DC

## 2024-02-18 MED ORDER — MIDAZOLAM HCL 2 MG/2ML IJ SOLN
4.0000 mg | Freq: Once | INTRAMUSCULAR | Status: AC
  Administered 2024-02-18: 4 mg via INTRAVENOUS
  Filled 2024-02-18: qty 4

## 2024-02-18 MED ORDER — METHYLENE BLUE (ANTIDOTE) 1 % IV SOLN
2.0000 mg/kg | Freq: Once | INTRAVENOUS | Status: AC
  Administered 2024-02-18: 158 mg via INTRAVENOUS
  Filled 2024-02-18: qty 15.8

## 2024-02-18 MED ORDER — LINEZOLID 600 MG/300ML IV SOLN
600.0000 mg | Freq: Two times a day (BID) | INTRAVENOUS | Status: DC
  Filled 2024-02-18: qty 300

## 2024-02-18 MED ORDER — NOREPINEPHRINE 16 MG/250ML-% IV SOLN
0.0000 ug/min | INTRAVENOUS | Status: DC
  Administered 2024-02-18: 20 ug/min via INTRAVENOUS
  Administered 2024-02-18: 60 ug/min via INTRAVENOUS
  Administered 2024-02-18: 59 ug/min via INTRAVENOUS
  Filled 2024-02-18 (×2): qty 250

## 2024-02-18 MED ORDER — AMIODARONE HCL IN DEXTROSE 360-4.14 MG/200ML-% IV SOLN
60.0000 mg/h | INTRAVENOUS | Status: AC
  Administered 2024-02-18 (×2): 60 mg/h via INTRAVENOUS
  Filled 2024-02-18 (×2): qty 200

## 2024-02-18 MED ORDER — SERTRALINE HCL 50 MG PO TABS: 50.0000 mg | ORAL_TABLET | Freq: Every day | ORAL | Status: DC

## 2024-02-18 MED ORDER — SODIUM BICARBONATE 8.4 % IV SOLN
INTRAVENOUS | Status: AC
  Administered 2024-02-18: 50 meq
  Filled 2024-02-18: qty 50

## 2024-02-18 MED ORDER — PROPOFOL 1000 MG/100ML IV EMUL
0.0000 ug/kg/min | INTRAVENOUS | Status: DC
  Administered 2024-02-18: 15 ug/kg/min via INTRAVENOUS
  Administered 2024-02-18: 20 ug/kg/min via INTRAVENOUS
  Filled 2024-02-18 (×2): qty 100

## 2024-02-18 MED ORDER — FAMOTIDINE IN NACL 20-0.9 MG/50ML-% IV SOLN: 20.0000 mg | INTRAVENOUS | Status: DC

## 2024-02-18 MED ORDER — EZETIMIBE 10 MG PO TABS: 10.0000 mg | ORAL_TABLET | Freq: Every evening | ORAL | Status: DC

## 2024-02-18 MED ORDER — ASPIRIN 81 MG PO CHEW: 324.0000 mg | CHEWABLE_TABLET | Freq: Every day | ORAL | Status: DC

## 2024-02-18 MED ORDER — ACETAMINOPHEN 325 MG PO TABS
650.0000 mg | ORAL_TABLET | Freq: Four times a day (QID) | ORAL | Status: DC | PRN
Start: 2024-02-18 — End: 2024-02-18

## 2024-02-18 MED ORDER — GLYCOPYRROLATE 0.2 MG/ML IJ SOLN: 0.2000 mg | INTRAMUSCULAR | Status: DC | PRN

## 2024-02-18 MED ORDER — SODIUM BICARBONATE 8.4 % IV SOLN: 100.0000 meq | Freq: Once | INTRAVENOUS | Status: AC

## 2024-02-18 MED ORDER — MIDAZOLAM-SODIUM CHLORIDE 100-0.9 MG/100ML-% IV SOLN
0.0000 mg/h | INTRAVENOUS | Status: DC
  Administered 2024-02-18: 1 mg/h via INTRAVENOUS
  Filled 2024-02-18: qty 100

## 2024-02-18 MED ORDER — TRAZODONE HCL 50 MG PO TABS: 50.0000 mg | ORAL_TABLET | Freq: Every evening | ORAL | Status: DC | PRN

## 2024-02-18 MED ORDER — AMIODARONE LOAD VIA INFUSION
150.0000 mg | Freq: Once | INTRAVENOUS | Status: DC
  Filled 2024-02-18: qty 83.34

## 2024-02-18 MED ORDER — PHENYLEPHRINE CONCENTRATED 100MG/250ML (0.4 MG/ML) INFUSION SIMPLE
0.0000 ug/min | INTRAVENOUS | Status: DC
  Administered 2024-02-18: 400 ug/min via INTRAVENOUS
  Administered 2024-02-18: 150 ug/min via INTRAVENOUS
  Filled 2024-02-18 (×4): qty 250

## 2024-02-18 MED ORDER — AMIODARONE HCL IN DEXTROSE 360-4.14 MG/200ML-% IV SOLN: 30.0000 mg/h | INTRAVENOUS | Status: DC

## 2024-02-18 MED ORDER — ONDANSETRON HCL 4 MG/2ML IJ SOLN: 4.0000 mg | Freq: Four times a day (QID) | INTRAMUSCULAR | Status: DC | PRN

## 2024-02-18 MED ORDER — SODIUM CHLORIDE 0.9% IV SOLUTION: Freq: Once | INTRAVENOUS | Status: DC

## 2024-02-18 MED ORDER — ACETAMINOPHEN 650 MG RE SUPP: 650.0000 mg | Freq: Four times a day (QID) | RECTAL | Status: DC | PRN

## 2024-02-18 MED ORDER — ROSUVASTATIN CALCIUM 20 MG PO TABS: 20.0000 mg | ORAL_TABLET | Freq: Every day | ORAL | Status: DC

## 2024-02-18 MED ORDER — SODIUM BICARBONATE 8.4 % IV SOLN: 50.0000 meq | Freq: Once | INTRAVENOUS | Status: DC

## 2024-02-18 MED ORDER — CALCIUM CHLORIDE 10 % IV SOLN: 1.0000 g | Freq: Once | INTRAVENOUS | Status: AC

## 2024-02-18 MED ORDER — PIPERACILLIN-TAZOBACTAM IN DEX 2-0.25 GM/50ML IV SOLN
2.2500 g | Freq: Three times a day (TID) | INTRAVENOUS | Status: DC
  Filled 2024-02-18 (×2): qty 50

## 2024-02-18 MED ORDER — SODIUM BICARBONATE 8.4 % IV SOLN
INTRAVENOUS | Status: AC
  Administered 2024-02-18: 100 meq via INTRAVENOUS
  Filled 2024-02-18: qty 100

## 2024-02-18 MED ORDER — EPINEPHRINE HCL 5 MG/250ML IV SOLN IN NS
0.5000 ug/min | INTRAVENOUS | Status: DC
  Administered 2024-02-18: 0.5 ug/min via INTRAVENOUS
  Filled 2024-02-18: qty 250

## 2024-02-18 MED ORDER — SENNOSIDES-DOCUSATE SODIUM 8.6-50 MG PO TABS: 1.0000 | ORAL_TABLET | Freq: Every evening | ORAL | Status: DC | PRN

## 2024-02-18 MED ORDER — OXYCODONE-ACETAMINOPHEN 5-325 MG PO TABS: 1.0000 | ORAL_TABLET | ORAL | Status: DC | PRN

## 2024-02-18 MED ORDER — ACETAMINOPHEN 325 MG PO TABS: 650.0000 mg | ORAL_TABLET | Freq: Four times a day (QID) | ORAL | Status: DC | PRN

## 2024-02-18 MED ORDER — MORPHINE 100MG IN NS 100ML (1MG/ML) PREMIX INFUSION
0.0000 mg/h | INTRAVENOUS | Status: DC
  Administered 2024-02-18: 5 mg/h via INTRAVENOUS
  Filled 2024-02-18: qty 100

## 2024-02-18 MED ORDER — SODIUM BICARBONATE 8.4 % IV SOLN
50.0000 meq | Freq: Once | INTRAVENOUS | Status: AC
  Administered 2024-02-18: 50 meq via INTRAVENOUS
  Filled 2024-02-18: qty 50

## 2024-02-18 MED ORDER — POLYVINYL ALCOHOL 1.4 % OP SOLN: 1.0000 [drp] | Freq: Four times a day (QID) | OPHTHALMIC | Status: DC | PRN

## 2024-02-18 MED ORDER — CALCIUM CHLORIDE 10 % IV SOLN
INTRAVENOUS | Status: AC
  Administered 2024-02-18: 1 g via INTRAVENOUS
  Filled 2024-02-18: qty 10

## 2024-02-18 MED ORDER — DONEPEZIL HCL 5 MG PO TABS
5.0000 mg | ORAL_TABLET | Freq: Every day | ORAL | Status: DC
  Filled 2024-02-18: qty 1

## 2024-02-18 MED ORDER — MORPHINE BOLUS VIA INFUSION
5.0000 mg | INTRAVENOUS | Status: DC | PRN
  Administered 2024-02-18: 5 mg via INTRAVENOUS

## 2024-02-18 MED ORDER — MIDAZOLAM BOLUS VIA INFUSION (WITHDRAWAL LIFE SUSTAINING TX)
2.0000 mg | INTRAVENOUS | Status: DC | PRN
Start: 2024-02-18 — End: 2024-02-18
  Administered 2024-02-18: 2 mg via INTRAVENOUS

## 2024-02-18 MED ORDER — MIDAZOLAM HCL 2 MG/2ML IJ SOLN: 1.0000 mg | INTRAMUSCULAR | Status: DC | PRN

## 2024-02-18 MED ORDER — TRANEXAMIC ACID-NACL 1000-0.7 MG/100ML-% IV SOLN
1000.0000 mg | Freq: Once | INTRAVENOUS | Status: AC
  Administered 2024-02-18: 1000 mg via INTRAVENOUS
  Filled 2024-02-18: qty 100

## 2024-02-18 MED ORDER — ONDANSETRON 4 MG PO TBDP: 4.0000 mg | ORAL_TABLET | Freq: Four times a day (QID) | ORAL | Status: DC | PRN

## 2024-02-18 MED ORDER — NOREPINEPHRINE 4 MG/250ML-% IV SOLN: 0.0000 ug/min | INTRAVENOUS | Status: DC

## 2024-02-18 MED ORDER — DIPHENHYDRAMINE HCL 50 MG/ML IJ SOLN: 25.0000 mg | INTRAMUSCULAR | Status: DC | PRN

## 2024-02-18 MED ORDER — GLYCOPYRROLATE 1 MG PO TABS
1.0000 mg | ORAL_TABLET | ORAL | Status: DC | PRN
Start: 2024-02-18 — End: 2024-02-18

## 2024-02-19 LAB — BPAM RBC
Blood Product Expiration Date: 202507022359
Blood Product Expiration Date: 202507122359
Blood Product Expiration Date: 202507122359
Blood Product Expiration Date: 202507142359
Blood Product Expiration Date: 202507142359
ISSUE DATE / TIME: 202506202018
ISSUE DATE / TIME: 202506202018
ISSUE DATE / TIME: 202506210120
ISSUE DATE / TIME: 202506210251
ISSUE DATE / TIME: 202506210338
Unit Type and Rh: 5100
Unit Type and Rh: 5100
Unit Type and Rh: 600
Unit Type and Rh: 600
Unit Type and Rh: 600

## 2024-02-19 LAB — CULTURE, BLOOD (ROUTINE X 2)
Culture: NO GROWTH
Culture: NO GROWTH
Special Requests: ADEQUATE
Special Requests: ADEQUATE

## 2024-02-19 LAB — TYPE AND SCREEN
ABO/RH(D): A NEG
Antibody Screen: NEGATIVE
Unit division: 0
Unit division: 0
Unit division: 0
Unit division: 0
Unit division: 0

## 2024-02-19 LAB — BPAM FFP
Blood Product Expiration Date: 202506262359
ISSUE DATE / TIME: 202506210251
Unit Type and Rh: 6200

## 2024-02-19 LAB — PREPARE FRESH FROZEN PLASMA

## 2024-02-20 ENCOUNTER — Encounter (HOSPITAL_COMMUNITY): Payer: Self-pay | Admitting: Vascular Surgery

## 2024-02-20 ENCOUNTER — Ambulatory Visit (HOSPITAL_COMMUNITY)

## 2024-02-20 ENCOUNTER — Encounter (HOSPITAL_COMMUNITY): Payer: Self-pay

## 2024-02-20 ENCOUNTER — Ambulatory Visit: Admitting: Cardiology

## 2024-02-20 LAB — GLUCOSE, CAPILLARY: Glucose-Capillary: 178 mg/dL — ABNORMAL HIGH (ref 70–99)

## 2024-02-20 LAB — CALCIUM, IONIZED: Calcium, Ionized, Serum: 4.7 mg/dL (ref 4.5–5.6)

## 2024-02-20 SURGERY — AMPUTATION, TOE
Anesthesia: Monitor Anesthesia Care | Site: Toe | Laterality: Right

## 2024-02-21 ENCOUNTER — Ambulatory Visit (HOSPITAL_BASED_OUTPATIENT_CLINIC_OR_DEPARTMENT_OTHER): Admitting: General Surgery

## 2024-02-28 NOTE — Procedures (Signed)
 Terminal extubation performed per md order. Family at bedside for extubation.

## 2024-02-28 NOTE — Procedures (Signed)
 Central Venous Catheter Insertion Procedure Note  Tina Hester  [id number]  26-Apr-1941  Date:02/22/2024  Time:1:21 AM   Provider Performing:Jametta Moorehead R Carlyne Keehan   Procedure: Insertion of Non-tunneled Central Venous [phone]) with US  guidance (23062)   Indication(s) Medication administration  Consent Risks of the procedure as well as the alternatives and risks of each were explained to the patient and/or caregiver.  Consent for the procedure was obtained and is signed in the bedside chart  Anesthesia Topical only with 1% lidocaine    Timeout Verified patient identification, verified procedure, site/side was marked, verified correct patient position, special equipment/implants available, medications/allergies/relevant history reviewed, required imaging and test results available.  Sterile Technique Maximal sterile technique including full sterile barrier drape, hand hygiene, sterile gown, sterile gloves, mask, hair covering, sterile ultrasound probe cover (if used).  Procedure Description Area of catheter insertion was cleaned with chlorhexidine and draped in sterile fashion.  With real-time ultrasound guidance a central venous catheter was placed into the left internal jugular vein. Nonpulsatile blood flow and easy flushing noted in all ports.  The catheter was sutured in place and sterile dressing applied.  Complications/Tolerance None; patient tolerated the procedure well. Chest X-ray is ordered to verify placement for internal jugular or subclavian cannulation.   Chest x-ray is not ordered for femoral cannulation.  EBL Minimal  Specimen(s) None  Tina Hester Tina Feltes, PA-C

## 2024-02-28 NOTE — IPAL (Signed)
  Interdisciplinary Goals of Care Family Meeting   Date carried out:: 02/08/2024  Location of the meeting: Unit  Member's involved: Physician and Family Member or next of kin  Durable Power of Attorney or Environmental health practitioner: daughter    Discussion: We discussed goals of care for [employer] .  Grim prognosis with persistently elevated lactate and acisosis. Family present and wants to withdraw care. We reviewed how this will look. Liberalized visitation, planning on getting family to bedside and making med adjustments for terminal extubation soon.  Code status: Full DNR  Disposition: In-patient comfort care   Time spent for the meeting: 10 min.   Leita SHAUNNA Gaskins 02/07/2024, 11:02 AM

## 2024-02-28 NOTE — Progress Notes (Signed)
  Progress Note    02/11/2024 10:10 AM 1 Day Post-Op  Subjective: Intubated and sedated, family at bedside  Vitals:   02/19/2024 0830 02/23/2024 0845  BP:    Pulse: (!) 128   Resp: (!) 30 (!) 29  Temp: 99.7 F (37.6 C) 99.7 F (37.6 C)  SpO2: (!) 88%     Physical Exam: Intubated sedated Abdomen soft Right groin Prevena VAC in place with drain minimal bloody drainage currently as above Right foot is warm with stable ulceration of the great toe Left foot is purple discolored but has brisk capillary refill  CBC    Component Value Date/Time   WBC 18.6 (H) 02/19/2024 0542   RBC 4.40 02/08/2024 0542   HGB 11.6 (L) 02/12/2024 0929   HGB 11.0 (L) 06/01/2017 1046   HCT 34.0 (L) 02/13/2024 0929   HCT 32.8 (L) 06/01/2017 1046   PLT 152 02/26/2024 0542   PLT 196 06/01/2017 1046   MCV 92.3 02/07/2024 0542   MCV 92 06/01/2017 1046   MCH 28.4 02/04/2024 0542   MCHC 30.8 02/23/2024 0542   RDW 15.2 02/08/2024 0542   RDW 14.0 06/01/2017 1046   LYMPHSABS 1.5 02/17/2024 2119   LYMPHSABS 2.4 06/01/2017 1046   MONOABS 0.9 02/17/2024 2119   EOSABS 0.0 02/17/2024 2119   EOSABS 0.1 06/01/2017 1046   BASOSABS 0.1 02/17/2024 2119   BASOSABS 0.0 06/01/2017 1046    BMET    Component Value Date/Time   NA 143 02/02/2024 0929   NA 141 02/02/2018 1051   K 4.5 01/30/2024 0929   CL 110 02/26/2024 0542   CO2 9 (L) 02/16/2024 0542   GLUCOSE 189 (H) 02/22/2024 0542   BUN 23 02/22/2024 0542   BUN 36 (H) 02/02/2018 1051   CREATININE 2.15 (H) 02/05/2024 0542   CALCIUM  9.1 01/31/2024 0542   GFRNONAA 22 (L) 02/22/2024 0542   GFRAA 45 (L) 04/11/2018 1137    INR    Component Value Date/Time   INR 3.6 (H) 02/04/2024 0138     Intake/Output Summary (Last 24 hours) at 02/26/2024 1010 Last data filed at 02/19/2024 9167 Gross per 24 hour  Intake 4967.16 ml  Output 570 ml  Net 4397.16 ml     Assessment/plan:  83 y.o. female is s/p right groin exploration with concern for bleeding status  post right lower extremity endovascular revascularization.  Unfortunately overnight she has increasing pressor requirement.  This appears to be secondary to cardiogenic shock after requiring general anesthesia and having significant hypotension following the initial procedure.  Family was updated at bedside and demonstrate good understanding of the grim prognosis.  I will be available as needed for further discussion.     Tanieka Pownall C. Sheree, MD [facility] Office: [phone] Pager: [phone]  02/14/2024 10:10 AM

## 2024-02-28 NOTE — Significant Event (Signed)
 Pt's pressor requirement has continued to increase overnight, she has been transfused 3 units, 250cc out of the drain, received multiple amps of bicarb and placed on bicarb gtt and given Ca.  Also given amio bolus and placed on infusion.  Vascular surgery aware  Hgb 7.1 and Fibrinogen 180, INR 3.6, 1 unit FFP ordered.  Maxed on Levo, Neo and Vaso, have ordered Methylene blue .  Pt is a DNR, have discussed with her daughter at the bedside that I am very concerned that patient may not survive the night.   [name] Graycee Greeson, PA-C

## 2024-02-28 NOTE — Progress Notes (Signed)
 NAME:  Tina Hester, MRN:  [id number], DOB:  [birth year], LOS: 4 ADMISSION DATE:  02/14/2024, CONSULTATION DATE:  02/23/2024 REFERRING MD:  Sheree , CHIEF COMPLAINT:  hypotension   History of Present Illness:  Tina Hester is a 83 y.o. F with PMH significant for CAD and NSTEMI 10/2023 with multivessel stenosis and felt not to be a candidate for CABG  or PCI and severe AS, Atrial fibrillation on Eliquis  and Plavix , OSA Type 2 DM, HTN,  PAD who initially presented 6/17 with R great toe increased pain and redness concerning for osteomyelitis.  She was seen by vascular surgery and underwent an angiogram with Dr. Magda, developed a post-op hematoma  that required exploration, repair and wound vac placement.  Pt was somewhat hypotensive and required vasopressin  and two units of PRBC's.  She was also left intubated, PCCM consulted in this setting.   Pertinent  Medical History   has a past medical history of Anemia, Anxiety, Aortic stenosis, Bilateral carotid artery stenosis (06/08/2015), Bradycardia, CKD (chronic kidney disease), stage III (HCC), Diabetes mellitus without complication (HCC), GERD (gastroesophageal reflux disease), Gout, HSV-1 (herpes simplex virus 1) infection, Hyperkalemia, Hyperlipidemia, Hypertension, Joint pain, MCI (mild cognitive impairment), Morbid obesity (HCC), OSA on CPAP, Osteopenia, PAF (paroxysmal atrial fibrillation) (HCC), Pain management, Pulmonary HTN (HCC), and TIA (transient ischemic attack).   Significant Hospital Events: Including procedures, antibiotic start and stop dates in addition to other pertinent events   6/17 presented with osteo 6/20 underwent L femoral angiogram with subsequent hematoma requiring exploration, left intubated and sedated   Interim History / Subjective:  Remains on vaso, NE, epi, neosynephrine, amiodarone , propofol . Family understands poor prognosis.   Objective    Blood pressure (!) 97/54, pulse (!) 103, temperature 98.4 F (36.9  C), resp. rate (!) 25, weight 78.8 kg, SpO2 94%.    Vent Mode: PRVC FiO2 (%):  [50 %] 50 % Set Rate:  [18 bmp] 18 bmp Vt Set:  [360 mL] 360 mL PEEP:  [5 cmH20] 5 cmH20 Plateau Pressure:  [16 cmH20] 16 cmH20   Intake/Output Summary (Last 24 hours) at 02/01/2024 9266 Last data filed at 02/13/2024 0240 Gross per 24 hour  Intake 2808 ml  Output 550 ml  Net 2258 ml   Filed Weights   02/14/24 1400  Weight: 78.8 kg    General: critically ill appearing woman lying in bed in NAD, intubated, sedated HEENT:  [place]/AT, eyes anicteric, ETT in place Neuro:  RASS -5 CV: S1S2, tachycardic, irreg rhythm PULM:  synchronous on MV, CTAB  GI: obese, soft, NT Extremities: cyanotic feet, L>R  6.99/32/155/7.6 LA >15  Resolved problem list   Assessment and Plan   R great toe osteomyelitis s/p endovascular revascularization of RLE  complicated by post-op hematoma requiring exploration, hematoma evacuation, and placement of wound VAC  Shock- likely both hemorrhagic and cardiogenic due to severe AS; no concern for septic shock, but could have a component of distributive shock from global hypoperfusion. Unfortunately has fixed cardiac output due to AS and severe 3-vessel CAD.  RLE cellulitis and osteomyelitis  -post-op care per VVS -con't 4 pressors; no ability to wean currently -con't broad antibiotics -wound vac per [facility] -hold plavix   Post-op ventilator management OSA on CPAP  -LTVV -VAP prevention protocol -PAD protocol for sedation -too unstable for SAT & SBT  Persistent lactic acidosis -repeat ABG & LA this morning> not better   Baseline CAD, AS and PAF -tele monitoring -amiodarone  for rate control  Type  2 DM -  SSI PRN  Hypothyroidism -continue synthroid    CKD stage III; progressive oliguria   Family later this morning elected to withdraw aggressive care measures and focus on comfort care once out of town family had arrived. [facility] agrees with transition to comfort-focused care.    Best Practice (right click and Reselect all SmartList Selections daily)   Diet/type: NPO DVT prophylaxis SCD Pressure ulcer(s): N/A GI prophylaxis: H2B Lines: Arterial Line Foley:  Yes, and it is still needed Code Status:  DNR Last date of multidisciplinary goals of care discussion [per primary team]  Labs   CBC: Recent Labs  Lab 02/14/24 1351 02/15/24 0552 02/17/24 0604 02/17/24 1643 02/17/24 1847 02/17/24 1958 02/17/24 2119 02/17/24 2123 02/03/2024 0023 02/08/2024 0045 02/27/2024 0212 02/20/2024 0509 01/29/2024 0542  WBC 9.0   < > 7.6 9.5 12.9*  --  13.6*  --   --   --   --   --  18.6*  NEUTROABS 5.9  --   --   --   --   --  11.0*  --   --   --   --   --   --   HGB 10.8*   < > 10.0* 9.9* 9.4*   < > 8.0*   < > 6.1* 7.1* 5.8* 11.6* 12.5  HCT 35.0*   < > 32.6* 32.1* 31.7*   < > 26.8*   < > 18.0* 23.5* 17.0* 34.0* 40.6  MCV 87.9   < > 87.4 88.7 90.3  --  94.7  --   --   --   --   --  92.3  PLT 188   < > 175 225 286  --  175  --   --   --   --   --  152   < > = values in this interval not displayed.    Basic Metabolic Panel: Recent Labs  Lab 02/15/24 0552 02/16/24 0538 02/17/24 0604 02/17/24 1958 02/17/24 2119 02/17/24 2123 02/17/24 2131 02/07/2024 0023 02/27/2024 0025 01/31/2024 0212 02/11/2024 0509 02/09/2024 0542  NA 137 137 140   < > 143   < >  --  143 141 147* 142 145  K 4.3 3.9 3.7   < > 4.5   < >  --  4.8 4.8 4.9 4.5 4.6  CL 109 109 110  --  116*  --   --   --  115*  --   --  110  CO2 18* 19* 20*  --  11*  --   --   --  10*  --   --  9*  GLUCOSE 100* 124* 103*  --  146*  --   --   --  142*  --   --  189*  BUN 19 21 21   --  22  --   --   --  22  --   --  23  CREATININE 1.32* 1.47* 1.54*  --  1.65*  --   --   --  1.76*  --   --  2.15*  CALCIUM  8.1* 8.1* 8.2*  --  9.7  --   --   --  8.6*  --   --  9.1  MG 1.9 1.8 1.8  --   --   --  1.9  --   --   --   --  2.0  PHOS  --   --   --   --   --   --  7.0*  --   --   --   --   --    < > =  values in this interval not  displayed.   GFR: Estimated Creatinine Clearance: 18.4 mL/min (A) (by C-G formula based on SCr of 2.15 mg/dL (H)). Recent Labs  Lab 02/17/24 1643 02/17/24 1847 02/17/24 2119 02/17/24 2132 02/09/2024 0032 02/21/2024 0542  PROCALCITON  --   --  <0.10  --   --   --   WBC 9.5 12.9* 13.6*  --   --  18.6*  LATICACIDVEN  --   --   --  8.4* >9.0*  --     Liver Function Tests: Recent Labs  Lab 02/15/24 0552 02/17/24 2119  AST 43* 350*  ALT 29 173*  ALKPHOS 71 44  BILITOT 0.8 0.6  PROT 5.9* 4.6*  ALBUMIN  2.4* 2.7*   Critical care time:        This patient is critically ill with multiple organ system failure which requires frequent high complexity decision making, assessment, support, evaluation, and titration of therapies. This was completed through the application of advanced monitoring technologies and extensive interpretation of multiple databases. During this encounter critical care time was devoted to patient care services described in this note for 40 minutes.  Leita SHAUNNA Gaskins, DO 02/01/2024 11:36 AM [facility]  For contact information, see Amion. If no response to pager, please call PCCM consult pager. After hours, 7PM- 7AM, please call Elink.

## 2024-02-28 NOTE — Death Summary Note (Signed)
 DEATH SUMMARY   Patient Details  Name: Tina Hester MRN: [id number] DOB: 1941/05/15  Admission/Discharge Information   Admit Date:  03/13/2024  Date of Death: Date of Death: 2024-03-17  Time of Death: Time of Death: March 27, 1157  Length of Stay: 4  Referring Physician: Verena Mems, MD   Reason(s) for Hospitalization  Diabetic foot wound  Diagnoses  Preliminary cause of death:  Secondary Diagnoses (including complications and co-morbidities):  Principal Problem:   Cellulitis of right foot Active Problems:   Osteomyelitis of great toe of right foot (HCC)   Preop examination   Atrial fibrillation with RVR (HCC)   Shock (HCC)   Hematoma Cardiogenic shock Hemorrhagic shock ABLA Consumptive coagulopathy PAD with left common femoral artery occlusion DM 2 with hyperglycemia Advanced dementia Persistent Afib AKI on CKD 3b Anemia of chronic disease HTN H/o hypothyroidism PAD Acute respiratory failure with hypoxia  Lactic acidosis    Brief Hospital Course (including significant findings, care, treatment, and services provided and events leading to death)  KENIDI ELENBAAS is a 83 y.o. year old female with PMH significant for CAD and NSTEMI 10/2023 with multivessel stenosis and felt not to be a candidate for CABG or PCI and severe AS, Atrial fibrillation on Eliquis  and Plavix , OSA Type 2 DM, HTN, PAD who initially presented 13-Mar-2024 with R great toe increased pain and redness concerning for osteomyelitis. She was seen by vascular surgery and underwent an angiogram with Dr. Magda, developed a post-op hematoma that required exploration, repair and wound vac placement. Pt was somewhat hypotensive and required vasopressin  and two units of PRBC's. She was also left intubated, PCCM consulted for ICU care.   Overnight she continued to have high vasopressor requirements despite bleeding control and volume resuscitation. Lactic acidosis failed to clear and she had refractory acidosis  despite ventilation and bicarb replacement. Due to poor baseline functional status and grim prognosis, family elected to withdraw aggressive life support and focus on comfort. She passed away with family at bedside.    Pertinent Labs and Studies  Significant Diagnostic Studies DG CHEST PORT 1 VIEW Result Date: 2024-03-17 EXAM: 1 VIEW XRAY OF THE CHEST 03-17-2024 01:25:00 AM COMPARISON: 1 view chest x-ray 02/17/2024. CLINICAL HISTORY: 200808 Hypoxia; [id number] Encounter for central line placement. FINDINGS: LUNGS AND PLEURA: No focal pulmonary opacity. No pneumothorax. Mild pulmonary vascular congestion. HEART AND MEDIASTINUM: Cardiomegaly. Atherosclerotic changes are present at the aortic arch. BONES AND SOFT TISSUES: No acute osseous abnormality. Surgical clips are present at the gallbladder fossa. LINES AND TUBES: The endotracheal tube is stable at 4.5 cm. A new left IJ line is in place. The tip is above the cavoatrial junction. The side port of the gastric tube is just past the GE junction. IMPRESSION: 1. Cardiomegaly and mild pulmonary vascular congestion. 2. Stable endotracheal tube and new left IJ line in place, tip above the cavoatrial junction. No pneumothorax. Electronically signed by: Lonni Necessary MD 03/17/2024 05:50 AM EDT RP Workstation: [id number]   DG Abd 1 View Result Date: 02/17/2024 CLINICAL DATA:  Feeding tube placement EXAM: ABDOMEN - 1 VIEW COMPARISON:  CT scan 05/06/2023 FINDINGS: Feeding tube noted with side port in the stomach fundus and tip in the gastric body. The tube appears satisfactorily positioned. Esophageal temperature probe terminates in the distal esophagus. Right upper quadrant clips from prior cholecystectomy. Lower thoracic and lumbar spondylosis. IMPRESSION: 1. Feeding tube appears satisfactorily positioned. 2. Esophageal temperature probe terminates in the distal esophagus. Electronically Signed   By: Ryan Ramond HERO.D.  On: 02/17/2024 21:58   DG CHEST PORT  1 VIEW Result Date: 02/17/2024 CLINICAL DATA:  Hypoxia EXAM: PORTABLE CHEST 1 VIEW COMPARISON:  Chest x-ray 11/13/2023 FINDINGS: Endotracheal tube tip is 2.8 cm above the carina. Enteric tube extends below the diaphragm. Esophageal monitor tip is at the gastroesophageal junction. The heart size and mediastinal contours are within normal limits. Both lungs are clear. No acute fractures are seen. Cervical spinal fusion plate is present. IMPRESSION: 1. No active disease. 2. Endotracheal tube tip is 2.8 cm above the carina.  The Electronically Signed   By: Greig Pique M.D.   On: 02/17/2024 21:52   VAS US  LOWER EXTREMITY ARTERIAL DUPLEX Result Date: 02/15/2024 LOWER EXTREMITY ARTERIAL DUPLEX STUDY Patient Name:  DERISHA FUNDERBURKE  Date of Exam:   02/15/2024 Medical Rec #: [id number]           Accession #:    [id number] Date of Birth: 1941-08-26           Patient Gender: F Patient Age:   52 years Exam Location:  [facility] Procedure:      VAS US  LOWER EXTREMITY ARTERIAL DUPLEX Referring Phys: [facility] --------------------------------------------------------------------------------  Indications: Ischemia & great toe ulceration/OM. High Risk Factors: Hypertension, hyperlipidemia, Diabetes, no history of                    smoking, prior MI, coronary artery disease. Other Factors: TIA, Afib, CKD, CHF.  Vascular Interventions: 02/25/2023 RLE femoropopliteal angioplasty & stenting. Current ABI:            RT - [place] LT - unable to obtain Limitations: continuous patient movement, calcific shadowing, tissue properties Comparison Study: Previous exam on 03/28/2023 RLE stent patent & ATA occlusion Performing Technologist: Jody Hill RVT, RDMS  Examination Guidelines: A complete evaluation includes B-mode imaging, spectral Doppler, color Doppler, and power Doppler as needed of all accessible portions of each vessel. Bilateral testing is considered an integral part of a complete examination. Limited examinations for  reoccurring indications may be performed as noted.  +-----------+--------+-----+-------------+----------+--------------------------+ RIGHT      PSV cm/sRatioStenosis     Waveform  Comments                   +-----------+--------+-----+-------------+----------+--------------------------+ CFA Mid    78                        biphasic                             +-----------+--------+-----+-------------+----------+--------------------------+ DFA        65                        biphasic                             +-----------+--------+-----+-------------+----------+--------------------------+ SFA Prox   90                        biphasic                             +-----------+--------+-----+-------------+----------+--------------------------+ SFA Mid    79                        biphasic                             +-----------+--------+-----+-------------+----------+--------------------------+  POP Mid    206          50-74%       monophasiccalcific/heterogenous                              stenosis               plaque                     +-----------+--------+-----+-------------+----------+--------------------------+ POP Distal 100                       monophasiccalcific/heterogenous                                                     plaque                     +-----------+--------+-----+-------------+----------+--------------------------+ TP Trunk   63                        monophasic                           +-----------+--------+-----+-------------+----------+--------------------------+ ATA Prox                occluded                                          +-----------+--------+-----+-------------+----------+--------------------------+ ATA Mid                 occluded                                          +-----------+--------+-----+-------------+----------+--------------------------+ ATA Distal               occluded                                          +-----------+--------+-----+-------------+----------+--------------------------+ PTA Prox                                       unable to visualize due to                                                patient                    +-----------+--------+-----+-------------+----------+--------------------------+ PTA Mid    49                        monophasic                           +-----------+--------+-----+-------------+----------+--------------------------+ PTA Distal 29  monophasic                           +-----------+--------+-----+-------------+----------+--------------------------+ PERO Prox                                      unable to visualize        +-----------+--------+-----+-------------+----------+--------------------------+ PERO Mid   78                        monophasic                           +-----------+--------+-----+-------------+----------+--------------------------+ PERO Distal71                        monophasic                           +-----------+--------+-----+-------------+----------+--------------------------+  Right Stent(s): +---------------+--++----------++ Prox to Stent  +---------------+--++----------++ Proximal Stent +---------------+--++----------++ Mid Stent      +---------------+--++----------++ Distal Stent   +---------------+--++----------++ Distal to Stent79monophasic +---------------+--++----------++    Summary: Right: 50-74% stenosis noted in the popliteal artery. Total occlusion noted in the anterior tibial artery. Femoropopliteal stent appears patent without evidence of significant stenosis. Arterial wall calcification throughout lower extremity. Heavy calcific shadowing in mid popliteal making imaging somewhat difficult.  See table(s) above for measurements  and observations. Electronically signed by Fonda Rim on 02/15/2024 at 7:06:14 PM.    Final    VAS US  ABI WITH/WO TBI Result Date: 02/15/2024  LOWER EXTREMITY DOPPLER STUDY Patient Name:  TEYA OTTERSON  Date of Exam:   02/15/2024 Medical Rec #: [id number]           Accession #:    [id number] Date of Birth: 1940/10/15           Patient Gender: F Patient Age:   28 years Exam Location:  [facility] Procedure:      VAS US  ABI WITH/WO TBI Referring Phys: ROBERT PATERSON --------------------------------------------------------------------------------  Indications: Ischemia & great toe ulceration/OM. High Risk         Hypertension, hyperlipidemia, prior MI, coronary artery Factors:          disease. Other Factors: TIA, Afib, CKD, CHF.  Vascular Interventions: 02/25/2023 RLE femoropopliteal angioplasty & stenting. Limitations: Today's exam was limited due to continuous patient movement, pain              intolerance. Comparison Study: Previous exam on 11/08/2023 Performing Technologist: Leigh Rom RVT, RDMS  Examination Guidelines: A complete evaluation includes at minimum, Doppler waveform signals and systolic blood pressure reading at the level of bilateral brachial, anterior tibial, and posterior tibial arteries, when vessel segments are accessible. Bilateral testing is considered an integral part of a complete examination. Photoelectric Plethysmograph (PPG) waveforms and toe systolic pressure readings are included as required and additional duplex testing as needed. Limited examinations for reoccurring indications may be performed as noted.  ABI Findings: +---------+------------------+-----+-----------+--------+ Right    Rt Pressure (mmHg)IndexWaveform   Comment  +---------+------------------+-----+-----------+--------+ Brachial 140                    multiphasic         +---------+------------------+-----+-----------+--------+ PTA      254  1.81 monophasic           +---------+------------------+-----+-----------+--------+ DP       84                0.60 monophasic          +---------+------------------+-----+-----------+--------+ Great Toe                       Abnormal            +---------+------------------+-----+-----------+--------+ +---------+------------------+-----+-----------+-------+ Left     Lt Pressure (mmHg)IndexWaveform   Comment +---------+------------------+-----+-----------+-------+ Brachial 136                    multiphasic        +---------+------------------+-----+-----------+-------+ PTA                                              +---------+------------------+-----+-----------+-------+ DP                                               +---------+------------------+-----+-----------+-------+ Great Toe0                 0.00 Absent             +---------+------------------+-----+-----------+-------+    patient pain intolerance, unable to attempt doppler/pressures.  Summary: Right: Resting right ankle-brachial index indicates noncompressible right lower extremity arteries. Unable to obtain TBI due to continuous patient movement, however doppler waveform severely dampened. *See table(s) above for measurements and observations.  Electronically signed by Fonda Rim on 02/15/2024 at 7:05:15 PM.    Final    MR TOES RIGHT W WO CONTRAST Result Date: 02/14/2024 CLINICAL DATA:  Right great toe wound with pain and swelling. History of diabetes. EXAM: MRI OF THE RIGHT TOES WITHOUT AND WITH CONTRAST TECHNIQUE: Multiplanar, multisequence MR imaging of the right forefoot was performed both before and after administration of intravenous contrast. CONTRAST:  7mL GADAVIST  GADOBUTROL  1 MMOL/ML IV SOLN COMPARISON:  MRI of the right foot dated 12/31/2022. FINDINGS: Bones/Joint/Cartilage T2/STIR hyperintense marrow edema of the distal phalanx of the great toe and the distal half of the proximal phalanx of the great toe with  associated enhancement on postcontrast sequences and subtle corresponding T1 hypointensity. Trace effusion of the interphalangeal joint of the great toe. These findings are most compatible with osteomyelitis/septic arthritis. No convincing marrow signal abnormality identified elsewhere to suggest osteomyelitis. No acute fracture or dislocation. Moderate first MTP joint space narrowing and osteophytosis. Mild-to-moderate joint space narrowing of the second through fifth MTP joints. Diffuse interphalangeal joint space narrowing. Moderate degenerative changes of the midfoot. Ligaments Collateral ligaments are intact.  Lisfranc ligament is intact. Muscles and Tendons Flexor and extensor compartment tendons are intact. Diffuse fatty atrophy with increased T2 signal of the intrinsic musculature of the foot is nonspecific and may reflect chronic denervation changes. Soft tissue Soft tissue wound/ulceration at the dorsal aspect of the mid great toe. No abscess. Diffuse nonspecific subcutaneous edema along the dorsal foot. IMPRESSION: 1. Marrow signal abnormality of the distal phalanx and distal half of the proximal phalanx of the great toe with associated enhancement and trace effusion of the interphalangeal joint of the great toe, most compatible with osteomyelitis/septic arthritis. 2. Soft tissue wound/ulceration at the  dorsal aspect of the mid great toe. No abscess. Diffuse nonspecific subcutaneous edema along the dorsal foot. Cellulitis can not be excluded. 3. Mild-to-moderate degenerative changes of the forefoot and midfoot, as above. Electronically Signed   By: Harrietta Sherry M.D.   On: 02/14/2024 18:17    Microbiology Recent Results (from the past 240 hours)  Blood culture (routine x 2)     Status: None (Preliminary result)   Collection Time: 02/14/24  1:51 PM   Specimen: BLOOD  Result Value Ref Range Status   Specimen Description   Final    BLOOD BLOOD RIGHT ARM Performed at [facility], [address], [place], [place] [address]    Special Requests   Final    BOTTLES DRAWN AEROBIC AND ANAEROBIC Blood Culture adequate volume Performed at [facility], [address] [place], [place] [address]    Culture   Final    NO GROWTH 4 DAYS Performed at [facility], [address] [place], [place] [address]    Report Status PENDING  Incomplete  Blood culture (routine x 2)     Status: None (Preliminary result)   Collection Time: 02/14/24  1:51 PM   Specimen: BLOOD  Result Value Ref Range Status   Specimen Description   Final    BLOOD BLOOD LEFT ARM Performed at [facility], [address], [place], [place] [address]    Special Requests   Final    BOTTLES DRAWN AEROBIC AND ANAEROBIC Blood Culture adequate volume Performed at [facility], [address], [place], [place] [address]    Culture   Final    NO GROWTH 4 DAYS Performed at [facility], [address] [place], [place] [address]    Report Status PENDING  Incomplete  MRSA Next Gen by PCR, Nasal     Status: None   Collection Time: 02/16/24  1:14 PM   Specimen: Nasal Mucosa; Nasal Swab  Result Value Ref Range Status   MRSA by PCR Next Gen NOT DETECTED NOT DETECTED Final    Comment: (NOTE) The GeneXpert MRSA Assay (FDA approved for NASAL specimens only), is one component of a comprehensive MRSA colonization surveillance program. It is not intended to diagnose MRSA infection nor to guide or monitor treatment for MRSA infections. Test performance is not FDA approved in patients less than 73 years old. Performed at [facility], [address], [place], [place] [address]   MRSA Next Gen by PCR, Nasal     Status: None   Collection Time: 02/17/24  9:34 PM   Specimen: Nasal Mucosa; Nasal Swab  Result Value Ref Range Status   MRSA by PCR Next Gen NOT DETECTED NOT DETECTED Final    Comment: (NOTE) The GeneXpert MRSA Assay (FDA  approved for NASAL specimens only), is one component of a comprehensive MRSA colonization surveillance program. It is not intended to diagnose MRSA infection nor to guide or monitor treatment for MRSA infections. Test performance is not FDA approved in patients less than 27 years old. Performed at [facility], [address] [place], [place] [address]     Lab Basic Metabolic Panel: Recent Labs  Lab 02/15/24 [phone] 02/16/24 9461 02/17/24 9395 02/17/24 1958 02/17/24 2119 02/17/24 2123 02/17/24 2131 02/23/2024 0023 02/14/2024 0025 02/14/2024 9787 02/09/2024 0509 02/04/2024 0542 02/02/2024 0929  NA 137 137 140   < > 143   < >  --    < >  141 147* 142 145 143  K 4.3 3.9 3.7   < > 4.5   < >  --    < > 4.8 4.9 4.5 4.6 4.5  CL 109 109 110  --  116*  --   --   --  115*  --   --  110  --   CO2 18* 19* 20*  --  11*  --   --   --  10*  --   --  9*  --   GLUCOSE 100* 124* 103*  --  146*  --   --   --  142*  --   --  189*  --   BUN 19 21 21   --  22  --   --   --  22  --   --  23  --   CREATININE 1.32* 1.47* 1.54*  --  1.65*  --   --   --  1.76*  --   --  2.15*  --   CALCIUM  8.1* 8.1* 8.2*  --  9.7  --   --   --  8.6*  --   --  9.1  --   MG 1.9 1.8 1.8  --   --   --  1.9  --   --   --   --  2.0  --   PHOS  --   --   --   --   --   --  7.0*  --   --   --   --   --   --    < > = values in this interval not displayed.   Liver Function Tests: Recent Labs  Lab 02/15/24 0552 02/17/24 2119  AST 43* 350*  ALT 29 173*  ALKPHOS 71 44  BILITOT 0.8 0.6  PROT 5.9* 4.6*  ALBUMIN  2.4* 2.7*   No results for input(s): LIPASE, AMYLASE in the last 168 hours. No results for input(s): AMMONIA in the last 168 hours. CBC: Recent Labs  Lab 02/14/24 1351 02/15/24 0552 02/17/24 0604 02/17/24 1643 02/17/24 1847 02/17/24 1958 02/17/24 2119 02/17/24 2123 02/25/2024 0045 02/20/2024 0212 02/01/2024 0509 02/07/2024 0542 02/24/2024 0929  WBC 9.0   < > 7.6 9.5 12.9*  --  13.6*  --   --   --   --  18.6*  --    NEUTROABS 5.9  --   --   --   --   --  11.0*  --   --   --   --   --   --   HGB 10.8*   < > 10.0* 9.9* 9.4*   < > 8.0*   < > 7.1* 5.8* 11.6* 12.5 11.6*  HCT 35.0*   < > 32.6* 32.1* 31.7*   < > 26.8*   < > 23.5* 17.0* 34.0* 40.6 34.0*  MCV 87.9   < > 87.4 88.7 90.3  --  94.7  --   --   --   --  92.3  --   PLT 188   < > 175 225 286  --  175  --   --   --   --  152  --    < > = values in this interval not displayed.   Cardiac Enzymes: Recent Labs  Lab 02/17/24 2119  CKTOTAL 39   Sepsis Labs: Recent Labs  Lab 02/17/24 1643 02/17/24 1847 02/17/24 2119 02/17/24 2132 02/23/2024 0032 02/05/2024 0542 02/15/2024 9062  PROCALCITON  --   --  <0.10  --   --   --   --   WBC 9.5 12.9* 13.6*  --   --  18.6*  --   LATICACIDVEN  --   --   --  8.4* >9.0*  --  >15.0*    Procedures/Operations   Ultrasound guided right common femoral antegrade access  Right lower extremity angiogram with first order cannulation  Right behind-the-knee popliteal artery angioplasty  Right tibioperoneal trunk angioplasty  Right posterior tibial angioplasty  Exploration right groin hematoma with primary repair of common femoral arteriotomy, application of Prevena VAC, placement 19 round drain  Aline insertion CVC    Leita SHAUNNA Gaskins 02/16/2024, 12:24 PM

## 2024-02-28 DEATH — deceased

## 2024-03-06 ENCOUNTER — Ambulatory Visit: Admitting: Vascular Surgery

## 2024-03-08 ENCOUNTER — Institutional Professional Consult (permissible substitution): Admitting: Cardiovascular Disease

## 2024-03-09 ENCOUNTER — Ambulatory Visit: Admitting: Cardiology

## 2024-05-03 ENCOUNTER — Ambulatory Visit: Admitting: Cardiology

## 2024-05-04 ENCOUNTER — Ambulatory Visit: Admitting: Podiatry
